# Patient Record
Sex: Female | Born: 1945 | Hispanic: No | Marital: Married | Smoking: Never smoker
Health system: Southern US, Community
[De-identification: ages and names within clinical notes are randomized; demographics above are authoritative.]

## PROBLEM LIST (undated history)

## (undated) DIAGNOSIS — E039 Hypothyroidism, unspecified: Secondary | ICD-10-CM

## (undated) DIAGNOSIS — E079 Disorder of thyroid, unspecified: Secondary | ICD-10-CM

## (undated) DIAGNOSIS — N3010 Interstitial cystitis (chronic) without hematuria: Secondary | ICD-10-CM

## (undated) DIAGNOSIS — G8929 Other chronic pain: Secondary | ICD-10-CM

## (undated) DIAGNOSIS — Z950 Presence of cardiac pacemaker: Secondary | ICD-10-CM

## (undated) DIAGNOSIS — I509 Heart failure, unspecified: Secondary | ICD-10-CM

## (undated) DIAGNOSIS — E119 Type 2 diabetes mellitus without complications: Secondary | ICD-10-CM

## (undated) DIAGNOSIS — I499 Cardiac arrhythmia, unspecified: Secondary | ICD-10-CM

## (undated) HISTORY — PX: NECK SURGERY: SHX720

## (undated) HISTORY — PX: ABDOMINAL HYSTERECTOMY: SHX81

## (undated) HISTORY — PX: SHOULDER SURGERY: SHX246

## (undated) HISTORY — PX: CHOLECYSTECTOMY: SHX55

## (undated) HISTORY — PX: THYROIDECTOMY: SHX17

---

## 2007-10-19 ENCOUNTER — Ambulatory Visit (HOSPITAL_BASED_OUTPATIENT_CLINIC_OR_DEPARTMENT_OTHER): Admission: RE | Admit: 2007-10-19 | Discharge: 2007-10-19 | Payer: Self-pay | Admitting: Urology

## 2010-06-08 NOTE — Op Note (Signed)
NAMECHANTI, GOLUBSKI              ACCOUNT NO.:  [id number]   MEDICAL RECORD NO.:  [id number]          PATIENT TYPE:  AMB   LOCATION:  [facility]                         FACILITY:  [facility]   PHYSICIAN:  Jamison Neighbor, M.D.  DATE OF BIRTH:  08-23-1945   DATE OF PROCEDURE:  10/19/2007  DATE OF DISCHARGE:                               OPERATIVE REPORT   PREOPERATIVE DIAGNOSES:  1. Interstitial cystitis.  2. Urgency incontinence.   POSTOPERATIVE DIAGNOSES:  1. Interstitial cystitis.  2. Urgency incontinence.   PROCEDURE:  Cystoscopy, urethral calibration, hydrodistention of the  bladder, Marcaine and Pyridium installation, Marcaine and Kenalog  injection, Botox injections x2.   SURGEON:  Dr. Logan Bores.   ANESTHESIA:  General.   COMPLICATIONS:  None.   DRAINS:  None.   BRIEF HISTORY:  This 65 year old female has been under care of a  urologist in [facility] for what has been felt to be a severe case of  interstitial cystitis.  The patient has significant pain requiring  around-the-clock narcotics and, despite their best efforts, have not  been able to get her under control.  The patient specifically came to  see me with a request for a cystectomy.  We have told her that she needs  to undergo evaluation to determine if that is appropriate for her.  We  did tell her if her pain was very poorly controlled, she had significant  ulceration on hydrodistention, and an anesthetic bladder capacity under  300 mL, we will contemplate cystectomy, but if those findings are not  present, cystectomy would clearly not be indicated.  The patient has  very poorly controlled frequency and urgency and has requested a Botox  injection be performed.  She understands that there does exist a slight  risk that she might have retention issues requiring catheterization.  She gave full informed consent.   PROCEDURE:  After successful induction of general anesthesia, the  patient was placed in the dorsal  position, prepped with Betadine and  draped in the usual sterile fashion.  Careful bimanual examination  revealed an unremarkable vagina.  There was no cystocele, rectocele,  enterocele.  No mass on bimanual exam.  There were no signs of urethral  diverticulum.  Urethra was calibrated with a 32-French with female  urethral sounds with no signs of stenosis or stricture.  The cystoscope  was inserted.  The bladder was carefully inspected.  No tumors or stones  could be seen.  Both ureteral orifices were normal in configuration and  location.  Hydrodistention of the bladder was performed.  The bladder  was distended at a pressure of 100 cm of water for 5 minutes.  When the  bladder was drained, minimal granulations could be seen.  The bladder  capacity was reasonable at 900 mL.  Normal bladder capacity is about  1100, and the average IC capacity is 575, indicating that while the pain  may be significant, this is not a end-stage case of interstitial  cystitis.  The patient did not require biopsy or fulguration.  Botox  injection was performed.  The bladder was injected with 20  different  injections for a total of 20 mL.  The injection was throughout the  bladder.  The bladder was drained.  A mixture of Marcaine and Pyridium  was left in the bladder.  A mixture of Marcaine and Kenalog was injected  periurethrally.  The patient tolerated the procedure well and was taken  to recovery room in good condition.  She has received intraoperative  Toradol, Zofran and a B&O suppository.  She will be sent home with  Percocet and doxycycline.  She already has Pyridium.  When she returns,  will assess the results of the hydrodistention and Botox.  If she has  ongoing issues with retention. she will either do self-catheterization  or perhaps a temporary suprapubic tube.  If she has ongoing problems  with pain not responsive to the hydrodistention, I will consider  instillation therapy as well as a broadening  of her medical therapy,  possibly including antidepressants and/or Lyrica and possibly include  medication directed to her pelvic floor.  Will also discuss with her the  possibility of controlling her frequency and urgency with InterStim if  the Botox does not work.  Also, physical therapy will be considered.  At  this point, however, we do not feel that the cystectomy will be  indicated do this relatively normal bladder capacity.  If there are  other forms of pelvic pain not necessary referable to the bladder, we  will consider referring her to Dr. Saralyn Pilar at [facility] for evaluation.      Jamison Neighbor, M.D.  Electronically Signed     RJE/MEDQ  D:  10/19/2007  T:  10/20/2007  Job:  [id number]

## 2010-10-25 LAB — POCT I-STAT 4, (NA,K, GLUC, HGB,HCT)
Glucose, Bld: 170 — ABNORMAL HIGH
Potassium: 4.2

## 2010-10-25 LAB — GLUCOSE, CAPILLARY: Glucose-Capillary: 170 — ABNORMAL HIGH

## 2012-09-15 ENCOUNTER — Emergency Department (HOSPITAL_BASED_OUTPATIENT_CLINIC_OR_DEPARTMENT_OTHER)
Admission: EM | Admit: 2012-09-15 | Discharge: 2012-09-15 | Disposition: A | Discharge status: Home / Self Care | Payer: Managed Care, Other (non HMO) | Attending: Emergency Medicine | Admitting: Emergency Medicine

## 2012-09-15 ENCOUNTER — Emergency Department (HOSPITAL_BASED_OUTPATIENT_CLINIC_OR_DEPARTMENT_OTHER): Payer: Managed Care, Other (non HMO)

## 2012-09-15 ENCOUNTER — Encounter (HOSPITAL_BASED_OUTPATIENT_CLINIC_OR_DEPARTMENT_OTHER): Payer: Self-pay | Admitting: *Deleted

## 2012-09-15 DIAGNOSIS — R109 Unspecified abdominal pain: Secondary | ICD-10-CM | POA: Insufficient documentation

## 2012-09-15 DIAGNOSIS — Z87448 Personal history of other diseases of urinary system: Secondary | ICD-10-CM | POA: Insufficient documentation

## 2012-09-15 DIAGNOSIS — R112 Nausea with vomiting, unspecified: Secondary | ICD-10-CM | POA: Insufficient documentation

## 2012-09-15 DIAGNOSIS — E079 Disorder of thyroid, unspecified: Secondary | ICD-10-CM | POA: Insufficient documentation

## 2012-09-15 DIAGNOSIS — Z90710 Acquired absence of both cervix and uterus: Secondary | ICD-10-CM | POA: Insufficient documentation

## 2012-09-15 DIAGNOSIS — Z79899 Other long term (current) drug therapy: Secondary | ICD-10-CM | POA: Insufficient documentation

## 2012-09-15 DIAGNOSIS — R197 Diarrhea, unspecified: Secondary | ICD-10-CM | POA: Insufficient documentation

## 2012-09-15 DIAGNOSIS — Z9089 Acquired absence of other organs: Secondary | ICD-10-CM | POA: Insufficient documentation

## 2012-09-15 DIAGNOSIS — E119 Type 2 diabetes mellitus without complications: Secondary | ICD-10-CM | POA: Insufficient documentation

## 2012-09-15 HISTORY — DX: Type 2 diabetes mellitus without complications: E11.9

## 2012-09-15 HISTORY — DX: Disorder of thyroid, unspecified: E07.9

## 2012-09-15 HISTORY — DX: Interstitial cystitis (chronic) without hematuria: N30.10

## 2012-09-15 LAB — COMPREHENSIVE METABOLIC PANEL
BUN: 10 mg/dL (ref 6–23)
CO2: 29 mEq/L (ref 19–32)
Calcium: 9.4 mg/dL (ref 8.4–10.5)
Chloride: 101 mEq/L (ref 96–112)
Creatinine, Ser: 0.9 mg/dL (ref 0.50–1.10)
GFR calc non Af Amer: 66 mL/min — ABNORMAL LOW (ref >90)
Total Bilirubin: 0.5 mg/dL (ref 0.3–1.2)

## 2012-09-15 LAB — CBC WITH DIFFERENTIAL/PLATELET
Basophils Relative: 0 % (ref 0–1)
Eosinophils Relative: 1 % (ref 0–5)
HCT: 39.7 % (ref 36.0–46.0)
Hemoglobin: 13.2 g/dL (ref 12.0–15.0)
Lymphocytes Relative: 23 % (ref 12–46)
MCHC: 33.2 g/dL (ref 30.0–36.0)
MCV: 84.5 fL (ref 78.0–100.0)
Monocytes Absolute: 0.7 10*3/uL (ref 0.1–1.0)
Monocytes Relative: 7 % (ref 3–12)
Neutro Abs: 6.8 10*3/uL (ref 1.7–7.7)

## 2012-09-15 LAB — URINALYSIS, ROUTINE W REFLEX MICROSCOPIC
Ketones, ur: NEGATIVE mg/dL
Leukocytes, UA: NEGATIVE
Nitrite: NEGATIVE
Protein, ur: NEGATIVE mg/dL
Urobilinogen, UA: 0.2 mg/dL (ref 0.0–1.0)

## 2012-09-15 LAB — LIPASE, BLOOD: Lipase: 34 U/L (ref 11–59)

## 2012-09-15 MED ORDER — OXYCODONE-ACETAMINOPHEN 5-325 MG PO TABS: 2.0000 | ORAL_TABLET | ORAL | Status: DC | PRN

## 2012-09-15 MED ORDER — PROMETHAZINE HCL 25 MG/ML IJ SOLN
25.0000 mg | Freq: Once | INTRAMUSCULAR | Status: AC
  Administered 2012-09-15: 25 mg via INTRAVENOUS
  Filled 2012-09-15: qty 1

## 2012-09-15 MED ORDER — ONDANSETRON 4 MG PO TBDP: 4.0000 mg | ORAL_TABLET | Freq: Three times a day (TID) | ORAL | Status: DC | PRN

## 2012-09-15 MED ORDER — IOHEXOL 300 MG/ML  SOLN
100.0000 mL | Freq: Once | INTRAMUSCULAR | Status: AC | PRN
  Administered 2012-09-15: 100 mL via INTRAVENOUS

## 2012-09-15 MED ORDER — DICYCLOMINE HCL 20 MG PO TABS: 20.0000 mg | ORAL_TABLET | Freq: Two times a day (BID) | ORAL | Status: DC

## 2012-09-15 MED ORDER — ONDANSETRON HCL 4 MG/2ML IJ SOLN
4.0000 mg | Freq: Once | INTRAMUSCULAR | Status: AC
  Administered 2012-09-15: 4 mg via INTRAVENOUS
  Filled 2012-09-15: qty 2

## 2012-09-15 MED ORDER — HYDROMORPHONE HCL PF 1 MG/ML IJ SOLN
1.0000 mg | Freq: Once | INTRAMUSCULAR | Status: AC
  Administered 2012-09-15: 1 mg via INTRAVENOUS
  Filled 2012-09-15: qty 1

## 2012-09-15 MED ORDER — SODIUM CHLORIDE 0.9 % IV BOLUS (SEPSIS)
1000.0000 mL | Freq: Once | INTRAVENOUS | Status: AC
  Administered 2012-09-15: 1000 mL via INTRAVENOUS

## 2012-09-15 MED ORDER — MORPHINE SULFATE 4 MG/ML IJ SOLN
4.0000 mg | Freq: Once | INTRAMUSCULAR | Status: AC
  Administered 2012-09-15: 4 mg via INTRAVENOUS
  Filled 2012-09-15: qty 1

## 2012-09-15 MED ORDER — IOHEXOL 300 MG/ML  SOLN
50.0000 mL | Freq: Once | INTRAMUSCULAR | Status: AC | PRN
  Administered 2012-09-15: 50 mL via ORAL

## 2012-09-15 NOTE — ED Provider Notes (Signed)
Medical screening examination/treatment/procedure(s) were performed by non-physician practitioner and as supervising physician I was immediately available for consultation/collaboration.   Lyanne Co, MD 09/15/12 2103

## 2012-09-15 NOTE — ED Notes (Signed)
Pt ambulating independently w/ steady gait on d/c in no acute distress, A&Ox4. D/c instructions reviewed w/ pt and family - pt and family deny any further questions or concerns at present. Rx given x3, Pt instructed to not use alcohol, drive, or operate heavy machinery while take the prescription pain medications as they could make him drowsy - pt verbalized understanding.

## 2012-09-15 NOTE — ED Notes (Signed)
N/V/D since last week.

## 2012-09-15 NOTE — ED Provider Notes (Signed)
CSN: [id number]     Arrival date & time 09/15/12  1215 History     First MD Initiated Contact with Patient 09/15/12 1415     Chief Complaint  Patient presents with  . Abdominal Pain   (Consider location/radiation/quality/duration/timing/severity/associated sxs/prior Treatment) HPI Comments: [name] a 67 year old female with a past medical history of thyroid cancer, diabetes, and interstitial cystitis who presents with a 1 week history of abdominal pain. The pain is located in her epigastrium and LLQ and does not radiate. The pain is described as cramping and severe. The pain started gradually and progressively worsened since the onset. No alleviating/aggravating factors. The patient has tried nothing for symptoms without relief. Associated symptoms include NVD. Patient denies fever, headache, chest pain, SOB, dysuria, constipation, abnormal vaginal bleeding/discharge. Patient reports previous cholecystectomy and hysterectomy.     [name] a 67 y.o. female presenting with abdominal pain.  Abdominal Pain Associated symptoms: diarrhea, nausea and vomiting     Past Medical History  Diagnosis Date  . Thyroid disease   . Diabetes mellitus without complication   . Interstitial cystitis    Past Surgical History  Procedure Laterality Date  . Abdominal hysterectomy    . Cholecystectomy    . Thyroidectomy     No family history on file. History  Substance Use Topics  . Smoking status: Never Smoker   . Smokeless tobacco: Not on file  . Alcohol Use: No   OB History   Grav Para Term Preterm Abortions TAB SAB Ect Mult Living                 Review of Systems  Gastrointestinal: Positive for nausea, vomiting, abdominal pain and diarrhea.  All other systems reviewed and are negative.    Allergies  Elmiron  Home Medications   Current Outpatient Rx  Name  Route  Sig  Dispense  Refill  . ALPRAZolam (XANAX) 1 MG tablet   Oral   Take 1 mg by mouth at bedtime as needed for  sleep.         . cyclobenzaprine (FLEXERIL) 10 MG tablet   Oral   Take 10 mg by mouth 3 (three) times daily as needed for muscle spasms.         Marland Kitchen desloratadine (CLARINEX) 5 MG tablet   Oral   Take 5 mg by mouth daily.         . fluconazole (DIFLUCAN) 150 MG tablet   Oral   Take 150 mg by mouth once.         Marland Kitchen glipiZIDE (GLUCOTROL XL) 2.5 MG 24 hr tablet   Oral   Take 2.5 mg by mouth daily.         Marland Kitchen levothyroxine (SYNTHROID, LEVOTHROID) 125 MCG tablet   Oral   Take 125 mcg by mouth daily before breakfast.         . lidocaine (LIDODERM) 5 %   Transdermal   Place 1 patch onto the skin daily. Remove & Discard patch within 12 hours or as directed by MD         . meclizine (ANTIVERT) 25 MG tablet   Oral   Take 25 mg by mouth 3 (three) times daily as needed.         . ondansetron (ZOFRAN) 4 MG tablet   Oral   Take 4 mg by mouth every 8 (eight) hours as needed for nausea.         Marland Kitchen oxyCODONE (OXYCONTIN) 10 MG  12 hr tablet   Oral   Take 15 mg by mouth every 12 (twelve) hours.         Marland Kitchen oxyCODONE-acetaminophen (PERCOCET) 10-325 MG per tablet   Oral   Take 1 tablet by mouth every 4 (four) hours as needed for pain.         Marland Kitchen terconazole (TERAZOL 3) 0.8 % vaginal cream   Vaginal   Place 1 applicator vaginally at bedtime.          BP 133/85  Pulse 106  Temp(Src) 98.6 F (37 C) (Oral)  Resp 20  Ht 5' (1.524 m)  Wt 223 lb (101.152 kg)  BMI 43.55 kg/m2  SpO2 97% Physical Exam  Nursing note and vitals reviewed. Constitutional: She is oriented to person, place, and time. She appears well-developed and well-nourished. No distress.  HENT:  Head: Normocephalic and atraumatic.  Eyes: Conjunctivae and EOM are normal. Pupils are equal, round, and reactive to light. No scleral icterus.  Neck: Normal range of motion.  Cardiovascular: Normal rate and regular rhythm.  Exam reveals no gallop and no friction rub.   No murmur heard. Pulmonary/Chest: Effort  normal and breath sounds normal. She has no wheezes. She has no rales. She exhibits no tenderness.  Abdominal: Soft. She exhibits no distension. There is tenderness. There is no rebound and no guarding.  Epigastric and LLQ tenderness to palpation. No peritoneal signs or other focal tenderness to palpation.   Musculoskeletal: Normal range of motion.  Neurological: She is alert and oriented to person, place, and time. Coordination normal.  Speech is goal-oriented. Moves limbs without ataxia.   Skin: Skin is warm and dry.  Psychiatric: She has a normal mood and affect. Her behavior is normal.    ED Course   Procedures (including critical care time)  Labs Reviewed  COMPREHENSIVE METABOLIC PANEL - Abnormal; Notable for the following:    Glucose, Bld 147 (*)    ALT 40 (*)    GFR calc non Af Amer 66 (*)    GFR calc Af Amer 76 (*)    All other components within normal limits  URINE CULTURE  CBC WITH DIFFERENTIAL  LIPASE, BLOOD  URINALYSIS, ROUTINE W REFLEX MICROSCOPIC   Ct Abdomen Pelvis W Contrast  09/15/2012   CLINICAL DATA:  Nausea, vomiting and diarrhea. Epigastric and mid abdominal pain. Previous cholecystectomy. Bladder neural stimulator.  EXAM: CT ABDOMEN AND PELVIS WITH CONTRAST  TECHNIQUE: Multidetector CT imaging of the abdomen and pelvis was performed using the standard protocol following bolus administration of intravenous contrast.  CONTRAST:  50mL OMNIPAQUE IOHEXOL 300 MG/ML SOLN, OMNIPAQUE IOHEXOL 300 MG/ML SOLN  COMPARISON:  None.  FINDINGS: 2.9 x 2.8 cm mass like density in the posterior aspect of the cecum, just below the level of the ileocecal valve. The remainder of the contents of the right colon are more fluid in density. The oral contrast has not reached the right colon at this point. No findings suspicious for appendicitis. Multiple sigmoid and descending colon diverticula without evidence of diverticulitis.  Neural stimulator lead crossing the sacrum on the left.  Bilateral pelvic phleboliths. Surgically absent uterus. No adnexal masses or enlarged lymph nodes.  Mild diffuse low density of the liver relative to the spleen. There is also a 1.4 cm rounded, poorly defined low density mass in the right lobe of the liver on image 19. No other liver masses are seen. The intrahepatic and extrahepatic bile ducts are mildly dilated, within normal limits following cholecystectomy.  Unremarkable spleen, pancreas and adrenal glands. Probable tiny cysts in both kidneys. Unremarkable urinary bladder.  Minimal linear atelectasis or scarring at the right lung base. Mild lumbar lower thoracic spine degenerative changes. Approximately 30% superior endplate compression deformity of the T12 vertebral body with Schmorl's node formation and mild bony retropulsion. No acute fracture lines.  IMPRESSION: 1. 2.9 cm mass or focal stool in the cecum. Delayed images through this region are recommended once the oral contrast has had time to pass into the right colon.  2.  Colonic diverticulosis without evidence of diverticulitis.  3. 1.4 cm nonspecific right lobe liver mass. Further evaluation with elective, outpatient pre and post contrast magnetic resonance imaging of the liver is recommended to better characterize this mass.  4.  Probable tiny cysts in both kidneys.  5. Old T12 vertebral compression fracture.   Electronically Signed   By: Gordan Payment   On: 09/15/2012 18:14   1. Abdominal pain   2. Nausea and vomiting   3. Diarrhea     MDM  4:06 PM Labs unremarkable for acute changes. Urinalysis unremarkable. Patient tachycardic with remaining vitals stable. Patient given morphine and zofran. Patient will have dilaudid and phenergan due to persistent pain.   6:42 PM CT scan unremarkable for acute changes that would cause her symptoms. Patient will have another liter of fluids here. Patient likely has a virus. Patient will be treated symptomatically with instructions to follow up with her  doctor. Patient instructed to return to the ED with worsening or concerning symptoms.   8:46 PM Patient had repeat CT abdomen pelvis due to delayed contrast movement. Cecal "mass" re-evaluated and seen to be stool. Patient will have symptomatic treatment and GI follow up. No further evaluation needed at this time.   Emilia Beck, [place] 09/15/12 2053

## 2012-09-15 NOTE — ED Notes (Signed)
Patient transported to CT 

## 2012-09-16 ENCOUNTER — Telehealth (HOSPITAL_COMMUNITY): Payer: Self-pay | Admitting: Emergency Medicine

## 2012-09-16 NOTE — ED Notes (Signed)
Pharmacy called for Rx verification of Percocet.

## 2012-09-18 LAB — URINE CULTURE

## 2019-06-22 ENCOUNTER — Encounter (HOSPITAL_BASED_OUTPATIENT_CLINIC_OR_DEPARTMENT_OTHER): Payer: Self-pay

## 2019-06-22 ENCOUNTER — Emergency Department (HOSPITAL_BASED_OUTPATIENT_CLINIC_OR_DEPARTMENT_OTHER)
Admission: EM | Admit: 2019-06-22 | Discharge: 2019-06-22 | Disposition: A | Discharge status: Home / Self Care | Payer: Medicare Other | Attending: Emergency Medicine | Admitting: Emergency Medicine

## 2019-06-22 ENCOUNTER — Other Ambulatory Visit: Payer: Self-pay

## 2019-06-22 ENCOUNTER — Emergency Department (HOSPITAL_BASED_OUTPATIENT_CLINIC_OR_DEPARTMENT_OTHER): Payer: Medicare Other

## 2019-06-22 DIAGNOSIS — T07XXXA Unspecified multiple injuries, initial encounter: Secondary | ICD-10-CM

## 2019-06-22 DIAGNOSIS — S8002XA Contusion of left knee, initial encounter: Secondary | ICD-10-CM | POA: Diagnosis not present

## 2019-06-22 DIAGNOSIS — E119 Type 2 diabetes mellitus without complications: Secondary | ICD-10-CM | POA: Diagnosis not present

## 2019-06-22 DIAGNOSIS — Y939 Activity, unspecified: Secondary | ICD-10-CM | POA: Diagnosis not present

## 2019-06-22 DIAGNOSIS — S301XXA Contusion of abdominal wall, initial encounter: Secondary | ICD-10-CM | POA: Diagnosis not present

## 2019-06-22 DIAGNOSIS — S8001XA Contusion of right knee, initial encounter: Secondary | ICD-10-CM | POA: Insufficient documentation

## 2019-06-22 DIAGNOSIS — Y999 Unspecified external cause status: Secondary | ICD-10-CM | POA: Insufficient documentation

## 2019-06-22 DIAGNOSIS — Z7984 Long term (current) use of oral hypoglycemic drugs: Secondary | ICD-10-CM | POA: Diagnosis not present

## 2019-06-22 DIAGNOSIS — Z79899 Other long term (current) drug therapy: Secondary | ICD-10-CM | POA: Diagnosis not present

## 2019-06-22 DIAGNOSIS — W1839XA Other fall on same level, initial encounter: Secondary | ICD-10-CM | POA: Insufficient documentation

## 2019-06-22 DIAGNOSIS — S7012XA Contusion of left thigh, initial encounter: Secondary | ICD-10-CM | POA: Diagnosis not present

## 2019-06-22 DIAGNOSIS — Y929 Unspecified place or not applicable: Secondary | ICD-10-CM | POA: Diagnosis not present

## 2019-06-22 DIAGNOSIS — E079 Disorder of thyroid, unspecified: Secondary | ICD-10-CM | POA: Diagnosis not present

## 2019-06-22 DIAGNOSIS — R519 Headache, unspecified: Secondary | ICD-10-CM | POA: Diagnosis not present

## 2019-06-22 DIAGNOSIS — R296 Repeated falls: Secondary | ICD-10-CM

## 2019-06-22 DIAGNOSIS — Z20822 Contact with and (suspected) exposure to covid-19: Secondary | ICD-10-CM | POA: Diagnosis not present

## 2019-06-22 LAB — COMPREHENSIVE METABOLIC PANEL
ALT: 20 U/L (ref 0–44)
AST: 19 U/L (ref 15–41)
Albumin: 3.8 g/dL (ref 3.5–5.0)
Alkaline Phosphatase: 75 U/L (ref 38–126)
Anion gap: 10 (ref 5–15)
BUN: 14 mg/dL (ref 8–23)
CO2: 24 mmol/L (ref 22–32)
Calcium: 8.2 mg/dL — ABNORMAL LOW (ref 8.9–10.3)
Chloride: 101 mmol/L (ref 98–111)
Creatinine, Ser: 1.14 mg/dL — ABNORMAL HIGH (ref 0.44–1.00)
GFR calc Af Amer: 56 mL/min — ABNORMAL LOW (ref >60)
GFR calc non Af Amer: 48 mL/min — ABNORMAL LOW (ref >60)
Glucose, Bld: 205 mg/dL — ABNORMAL HIGH (ref 70–99)
Potassium: 4.2 mmol/L (ref 3.5–5.1)
Sodium: 135 mmol/L (ref 135–145)
Total Bilirubin: 0.5 mg/dL (ref 0.3–1.2)
Total Protein: 7.7 g/dL (ref 6.5–8.1)

## 2019-06-22 LAB — CBC WITH DIFFERENTIAL/PLATELET
Abs Immature Granulocytes: 0.01 10*3/uL (ref 0.00–0.07)
Basophils Absolute: 0 10*3/uL (ref 0.0–0.1)
Basophils Relative: 1 %
Eosinophils Absolute: 0.1 10*3/uL (ref 0.0–0.5)
Eosinophils Relative: 3 %
HCT: 34.7 % — ABNORMAL LOW (ref 36.0–46.0)
Hemoglobin: 11.3 g/dL — ABNORMAL LOW (ref 12.0–15.0)
Immature Granulocytes: 0 %
Lymphocytes Relative: 35 %
Lymphs Abs: 1.5 10*3/uL (ref 0.7–4.0)
MCH: 27 pg (ref 26.0–34.0)
MCHC: 32.6 g/dL (ref 30.0–36.0)
MCV: 82.8 fL (ref 80.0–100.0)
Monocytes Absolute: 0.3 10*3/uL (ref 0.1–1.0)
Monocytes Relative: 7 %
Neutro Abs: 2.3 10*3/uL (ref 1.7–7.7)
Neutrophils Relative %: 54 %
Platelets: 266 10*3/uL (ref 150–400)
RBC: 4.19 MIL/uL (ref 3.87–5.11)
RDW: 16.2 % — ABNORMAL HIGH (ref 11.5–15.5)
WBC: 4.3 10*3/uL (ref 4.0–10.5)
nRBC: 0 % (ref 0.0–0.2)

## 2019-06-22 LAB — TSH: TSH: 4.302 u[IU]/mL (ref 0.350–4.500)

## 2019-06-22 LAB — PHOSPHORUS: Phosphorus: 3.8 mg/dL (ref 2.5–4.6)

## 2019-06-22 LAB — SARS CORONAVIRUS 2 BY RT PCR (HOSPITAL ORDER, PERFORMED IN ~~LOC~~ HOSPITAL LAB): SARS Coronavirus 2: NEGATIVE

## 2019-06-22 LAB — PROTIME-INR
INR: 0.9 (ref 0.8–1.2)
Prothrombin Time: 12.2 seconds (ref 11.4–15.2)

## 2019-06-22 LAB — CK: Total CK: 71 U/L (ref 38–234)

## 2019-06-22 LAB — MAGNESIUM: Magnesium: 1.9 mg/dL (ref 1.7–2.4)

## 2019-06-22 LAB — SEDIMENTATION RATE: Sed Rate: 21 mm/hr (ref 0–22)

## 2019-06-22 NOTE — ED Notes (Signed)
Per pt and her daughter, pt has been falling for months , has hit her head several times had a black eye , per daughter pts speech is very slurred after  And her vsision is always blurry after  States feels like her legs give out and she goes down  , daughter states they have seen her PCP but no tests have been ordered , they are requesting a neuro consult, pt and daughter states pt has cont to drive during these episodes , pt states doesn't get dizzy tho

## 2019-06-22 NOTE — ED Triage Notes (Signed)
Pt arrives with daughter who reports pt has been falling and having trouble with her speech for a few months. Reports patient had another fall yesterday and states that her speech has gotten worse since. Pt reports that when she falls her legs become very weak.

## 2019-06-22 NOTE — Discharge Instructions (Addendum)
1.  Follow-up with [facility] as soon as possible. 2.  Take extreme care with position changes and walking.  Sure before you start to walk that you are steady and do not feel weak or lightheaded.  Sit down immediately if you feel that you are lightheaded.  Use a walker in your home. 3.  Return to the emergency department if you get new or advancing symptoms.

## 2019-06-22 NOTE — ED Provider Notes (Signed)
[facility] EMERGENCY DEPARTMENT Provider Note   CSN: [id number] Arrival date & time: 06/22/19  1255     History Chief Complaint  Patient presents with  . Fall    Nykole Shands is a 74 y.o. female.  HPI Patient has been having frequent falls for about 3 weeks.  She reports that her legs just pretty suddenly feel like jelly and give way.  She denies that she is getting loss of consciousness.  She does not perceive palpitations, chest pain or shortness of breath.  She reports when it happens she cannot stop her fall and just goes to the ground.  She has developed multiple bruises and areas of injury from repetitively falling.  She denies she is experiencing any headache.  She reports she has hit her head a couple of times with these falls.  Denies she is having any visual changes.  She does not have any double vision or loss of vision.  At rest, she is not experiencing focal weakness numbness or tingling of her arms or legs.  She has a very distant history of a cervical spine surgery.  She reports she had a problem with a disc in her neck.  He denies she is experiencing neck pain.  She reports history of scoliosis and back problems but does not feel like that is an active problem right now.  She denies she has loss of continence of her bladder.  She does report that she has interstitial cystitis and some chronic bladder issues but nothing of the change acutely. She has had some decreased appetite.  Appetite fluctuates. [name] seen at [facility].  Patient's daughter reports that they have discussed these falls and her concerns regarding this.  They report however they have not had any imaging studies or tests done at this point.  No referrals have been made to neurology yet.    Past Medical History:  Diagnosis Date  . Diabetes mellitus without complication (HCC)   . Interstitial cystitis   . Thyroid disease     There are no problems to display for this  patient.   Past Surgical History:  Procedure Laterality Date  . ABDOMINAL HYSTERECTOMY    . CHOLECYSTECTOMY    . NECK SURGERY    . SHOULDER SURGERY    . THYROIDECTOMY       OB History   No obstetric history on file.     No family history on file.  Social History   Tobacco Use  . Smoking status: Never Smoker  . Smokeless tobacco: Never Used  Substance Use Topics  . Alcohol use: No  . Drug use: Never    Home Medications Prior to Admission medications   Medication Sig Start Date End Date Taking? Authorizing Provider  ALPRAZolam Prudy Feeler) 1 MG tablet Take 1 mg by mouth at bedtime as needed for sleep.    [provider]  cyclobenzaprine (FLEXERIL) 10 MG tablet Take 10 mg by mouth 3 (three) times daily as needed for muscle spasms.    [provider]  desloratadine (CLARINEX) 5 MG tablet Take 5 mg by mouth daily.    [provider]  dicyclomine (BENTYL) 20 MG tablet Take 1 tablet (20 mg total) by mouth 2 (two) times daily. 09/15/12   Emilia Beck, PA-C  fluconazole (DIFLUCAN) 150 MG tablet Take 150 mg by mouth once.    [provider]  glipiZIDE (GLUCOTROL XL) 2.5 MG 24 hr tablet Take 2.5 mg by mouth daily.  [provider]  levothyroxine (SYNTHROID, LEVOTHROID) 125 MCG tablet Take 125 mcg by mouth daily before breakfast.    [provider]  lidocaine (LIDODERM) 5 % Place 1 patch onto the skin daily. Remove & Discard patch within 12 hours or as directed by MD    [provider]  meclizine (ANTIVERT) 25 MG tablet Take 25 mg by mouth 3 (three) times daily as needed.    [provider]  ondansetron (ZOFRAN ODT) 4 MG disintegrating tablet Take 1 tablet (4 mg total) by mouth every 8 (eight) hours as needed for nausea. 09/15/12   Szekalski, Yvonna Alanis, PA-C  ondansetron (ZOFRAN) 4 MG tablet Take 4 mg by mouth every 8 (eight) hours as needed for nausea.    [provider]  oxyCODONE (OXYCONTIN) 10 MG 12 hr  tablet Take 15 mg by mouth every 12 (twelve) hours.    [provider]  oxyCODONE-acetaminophen (PERCOCET) 10-325 MG per tablet Take 1 tablet by mouth every 4 (four) hours as needed for pain.    [provider]  oxyCODONE-acetaminophen (PERCOCET/ROXICET) 5-325 MG per tablet Take 2 tablets by mouth every 4 (four) hours as needed for pain. 09/15/12   Emilia Beck, PA-C  terconazole (TERAZOL 3) 0.8 % vaginal cream Place 1 applicator vaginally at bedtime.    [provider]    Allergies    Pentosan polysulfate sodium, Elmiron [pentosan polysulfate], and Naloxone  Review of Systems   Review of Systems 10 systems reviewed and negative except as per HPI. Physical Exam Updated Vital Signs BP (!) 170/78 (BP Location: Right Arm)   Pulse 72   Temp 98.4 F (36.9 C) (Oral)   Resp 16   Ht 5' (1.524 m)   Wt 90.7 kg   SpO2 99%   BMI 39.06 kg/m   Physical Exam Constitutional:      Comments: Alert nontoxic.  No respiratory distress.  Mental status clear.  HENT:     Head: Normocephalic and atraumatic.     Nose: Nose normal.     Mouth/Throat:     Mouth: Mucous membranes are moist.     Pharynx: Oropharynx is clear.  Eyes:     Extraocular Movements: Extraocular movements intact.     Conjunctiva/sclera: Conjunctivae normal.     Pupils: Pupils are equal, round, and reactive to light.  Neck:     Comments: Well-healed scar on the posterior cervical spine.  No meningismus.  No soft tissue masses. Cardiovascular:     Rate and Rhythm: Normal rate and regular rhythm.  Pulmonary:     Effort: Pulmonary effort is normal.     Comments: Breath sounds decreased in the left lung base.  Otherwise clear.  Patient has multiple bruises of the left chest wall and hip area.  She denies moderate to mild discomfort of the left thoracic rib cage. Abdominal:     General: There is no distension.     Palpations: Abdomen is soft.     Tenderness: There is no abdominal tenderness. There  is no guarding.     Comments: Multiple bruises of the left lateral abdomen.  Smaller bruise right lateral abdomen.  Musculoskeletal:        General: Normal range of motion.     Comments: Patient has multiple bruises on both knees and large bruises to the left thigh.  These are variable ages from brownish-green to purple.  No warm hematomas present at this time.  Motion intact however no deformities or effusions of the joints.  Patient can elevate each leg independently off the bed and hold.  She can flex and extend at the ankles and bend at the knees.  Skin:    General: Skin is warm and dry.  Neurological:     General: No focal deficit present.     Mental Status: She is oriented to person, place, and time.     Cranial Nerves: No cranial nerve deficit.     Sensory: No sensory deficit.     Coordination: Coordination normal.     Comments: Speech clear.  Cognitive function intact.  Finger-nose exam intact.  Pupils symmetric and responsive to light.  Motor strength upper and lower 4\5 for grip some push and pull.  Dorsiflexion flexion plantar flexion intact against resistance.  No subjective loss of sensation to light touch to lower extremities.  Psychiatric:        Mood and Affect: Mood normal.     ED Results / Procedures / Treatments   Labs (all labs ordered are listed, but only abnormal results are displayed) Labs Reviewed  COMPREHENSIVE METABOLIC PANEL - Abnormal; Notable for the following components:      Result Value   Glucose, Bld 205 (*)    Creatinine, Ser 1.14 (*)    Calcium 8.2 (*)    GFR calc non Af Amer 48 (*)    GFR calc Af Amer 56 (*)    All other components within normal limits  CBC WITH DIFFERENTIAL/PLATELET - Abnormal; Notable for the following components:   Hemoglobin 11.3 (*)    HCT 34.7 (*)    RDW 16.2 (*)    All other components within normal limits  SARS CORONAVIRUS 2 BY RT PCR (HOSPITAL ORDER, [facility])  PROTIME-INR  CK   SEDIMENTATION RATE  MAGNESIUM  PHOSPHORUS  URINALYSIS, ROUTINE W REFLEX MICROSCOPIC  TSH    EKG EKG Interpretation  Date/Time:  Saturday Jun 22 2019 14:21:17 EDT Ventricular Rate:  70 PR Interval:    QRS Duration: 105 QT Interval:  406 QTC Calculation: 439 R Axis:   -16 Text Interpretation: Sinus rhythm no change from old Confirmed by Charlesetta Shanks [phone]) on 06/22/2019 2:56:25 PM   Radiology DG Chest 2 View  Result Date: 06/22/2019 CLINICAL DATA:  Pain status post fall EXAM: CHEST - 2 VIEW COMPARISON:  None. FINDINGS: The heart size and mediastinal contours are within normal limits. Both lungs are clear. The visualized skeletal structures are unremarkable. IMPRESSION: No active cardiopulmonary disease. Electronically Signed   By: Constance Holster M.D.   On: 06/22/2019 15:00   CT Head Wo Contrast  Result Date: 06/22/2019 CLINICAL DATA:  74 year old female with multiple falls and slurred speech. EXAM: CT HEAD WITHOUT CONTRAST CT CERVICAL SPINE WITHOUT CONTRAST TECHNIQUE: Multidetector CT imaging of the head and cervical spine was performed following the standard protocol without intravenous contrast. Multiplanar CT image reconstructions of the cervical spine were also generated. COMPARISON:  09/20/2009 head and cervical spine CT FINDINGS: CT HEAD FINDINGS Brain: No evidence of acute infarction, hemorrhage, hydrocephalus, extra-axial collection or mass lesion/mass effect. Minimal chronic small-vessel white matter ischemic changes again noted. Vascular: No hyperdense vessel or unexpected calcification. Skull: Normal. Negative for fracture or focal lesion. Sinuses/Orbits: No acute finding. Other: None. CT CERVICAL SPINE FINDINGS Alignment: Normal. Skull base and vertebrae: No acute fracture. No primary bone lesion or focal pathologic process. Soft tissues and spinal canal: No prevertebral fluid or swelling. No visible canal hematoma. Disc levels: Mild degenerative disc disease and  spondylosis at C5-6 and C6-7 noted. Upper chest: Negative. Other: None IMPRESSION: 1. No evidence of acute intracranial abnormality. Minimal chronic small-vessel white matter ischemic changes. 2. No static evidence of acute injury to the cervical spine. Electronically Signed   By: Harmon PierJeffrey  Hu M.D.   On: 06/22/2019 15:11   CT Cervical Spine Wo Contrast  Result Date: 06/22/2019 CLINICAL DATA:  74 year old female with multiple falls and slurred speech. EXAM: CT HEAD WITHOUT CONTRAST CT CERVICAL SPINE WITHOUT CONTRAST TECHNIQUE: Multidetector CT imaging of the head and cervical spine was performed following the standard protocol without intravenous contrast. Multiplanar CT image reconstructions of the cervical spine were also generated. COMPARISON:  09/20/2009 head and cervical spine CT FINDINGS: CT HEAD FINDINGS Brain: No evidence of acute infarction, hemorrhage, hydrocephalus, extra-axial collection or mass lesion/mass effect. Minimal chronic small-vessel white matter ischemic changes again noted. Vascular: No hyperdense vessel or unexpected calcification. Skull: Normal. Negative for fracture or focal lesion. Sinuses/Orbits: No acute finding. Other: None. CT CERVICAL SPINE FINDINGS Alignment: Normal. Skull base and vertebrae: No acute fracture. No primary bone lesion or focal pathologic process. Soft tissues and spinal canal: No prevertebral fluid or swelling. No visible canal hematoma. Disc levels: Mild degenerative disc disease and spondylosis at C5-6 and C6-7 noted. Upper chest: Negative. Other: None IMPRESSION: 1. No evidence of acute intracranial abnormality. Minimal chronic small-vessel white matter ischemic changes. 2. No static evidence of acute injury to the cervical spine. Electronically Signed   By: Harmon PierJeffrey  Hu M.D.   On: 06/22/2019 15:11    Procedures Procedures (including critical care time)  Medications Ordered in ED Medications - No data to display  ED Course  I have reviewed the triage  vital signs and the nursing notes.  Pertinent labs & imaging results that were available during my care of the patient were reviewed by me and considered in my medical decision making (see chart for details).    MDM Rules/Calculators/A&P                     Patient presents with frequent falls over the course of the past 3 weeks.  Her mental status is alert and clear.  She does not describe syncopal symptoms.  We will proceed with CT head and lab work.  Will also get chest x-ray as patient has had multiple bruises over the chest wall.  CT does not show any acute infarct patterns.  Cervical spine does not show any stenosis or likely significant impingement.  Patient does not experience neck pain.  At this point she has been having frequent falls which seem to come with sudden weakness of the extremities.  This does not sound syncopal.  On physical exam patient has good strength testing.  Basic evaluation does not suggest autoimmune condition, myositis, electrolyte derangement or anemia.  Condition of the lower extremities is good aside from bruising from falls.  She has excellent pulses and condition is good.  At this time, plan will be for discharge to home with extra care for falls and recommendation to use a walker at home.  Ambulatory referral has been sent to neurology.  Patient should also follow-up ASAP with PCP for additional evaluation and referrals as needed. Final Clinical Impression(s) / ED Diagnoses Final diagnoses:  Frequent falls  Multiple contusions    Rx / DC Orders ED Discharge Orders         Ordered    Ambulatory referral to Neurology  Status:  Canceled    Comments:  An appointment is requested in approximately: 1 week   06/22/19 1552    Ambulatory referral to Neurology    Comments: An appointment is requested in approximately:1 week   06/22/19 1554           Arby Barrette, MD 06/22/19 1558

## 2019-06-26 ENCOUNTER — Other Ambulatory Visit: Payer: Self-pay

## 2019-06-26 ENCOUNTER — Encounter: Payer: Self-pay | Admitting: Neurology

## 2019-06-26 ENCOUNTER — Ambulatory Visit (INDEPENDENT_AMBULATORY_CARE_PROVIDER_SITE_OTHER): Payer: Medicare Other | Admitting: Neurology

## 2019-06-26 VITALS — BP 145/80 | HR 76 | Ht 60.0 in | Wt 209.0 lb

## 2019-06-26 DIAGNOSIS — S0990XS Unspecified injury of head, sequela: Secondary | ICD-10-CM

## 2019-06-26 DIAGNOSIS — R296 Repeated falls: Secondary | ICD-10-CM | POA: Diagnosis not present

## 2019-06-26 DIAGNOSIS — R270 Ataxia, unspecified: Secondary | ICD-10-CM

## 2019-06-26 DIAGNOSIS — H519 Unspecified disorder of binocular movement: Secondary | ICD-10-CM

## 2019-06-26 DIAGNOSIS — S0990XA Unspecified injury of head, initial encounter: Secondary | ICD-10-CM | POA: Diagnosis not present

## 2019-06-26 DIAGNOSIS — M542 Cervicalgia: Secondary | ICD-10-CM

## 2019-06-26 NOTE — Patient Instructions (Signed)

## 2019-06-26 NOTE — Progress Notes (Signed)
Provider:  Melvyn Novas, M D  Referring Provider: Karle Plumber, MD Primary Care Physician:  Karle Plumber, MD  Chief Complaint  Patient presents with   New Patient (Initial Visit)    RM 10 with granddaughter, Lauren. ED referral for frequent falls in the last few weeks. She has no warning, her legs will give out on her. Been going on for the last couple months.     HPI:  Jillian Leblanc is a 74 y.o. female  And seen here on 06-26-2019  as a referral from [facility] for frequent , sudden falls.   Jillian Leblanc describes that she has no warning, no aura, no spell of dizziness or vision change.  She may fall forward backwards or sideways.  She fell forwards on her face onto a concrete surface - her head hit the floor. She scratched her glasses, her chin and had a black eye.  The falls have become more frequent have been going on for couple of months and they can occur at anytime of the day and seem unrelated to mealtimes, sleep hours, or any activity preceding a fall.  She is not excessively fatigued when she experiences these she is not nauseated. No loss of awareness.  She has bruising on both elbows and left hip.  She has fallen more than twice a week. The last time 06-22-2019.   Review of Systems: Out of a complete 14 system review, the patient complains of only the following symptoms, and all other reviewed systems are negative.   Social History   Socioeconomic History   Marital status: Married    Spouse name: Not on file   Number of children: Not on file   Years of education: Not on file   Highest education level: Not on file  Occupational History   Not on file  Tobacco Use   Smoking status: Never Smoker   Smokeless tobacco: Never Used  Substance and Sexual Activity   Alcohol use: No   Drug use: Never   Sexual activity: Not on file  Other Topics Concern   Not on file  Social History Narrative   Not on file   Social Determinants  of Health   Financial Resource Strain:    Difficulty of Paying Living Expenses:   Food Insecurity:    Worried About Running Out of Food in the Last Year:    Barista in the Last Year:   Transportation Needs:    Freight forwarder (Medical):    Lack of Transportation (Non-Medical):   Physical Activity:    Days of Exercise per Week:    Minutes of Exercise per Session:   Stress:    Feeling of Stress :   Social Connections:    Frequency of Communication with Friends and Family:    Frequency of Social Gatherings with Friends and Family:    Attends Religious Services:    Active Member of Clubs or Organizations:    Attends Engineer, structural:    Marital Status:   Intimate Partner Violence:    Fear of Current or Ex-Partner:    Emotionally Abused:    Physically Abused:    Sexually Abused:     No family history on file.  Past Medical History:  Diagnosis Date   Diabetes mellitus without complication (HCC)    Interstitial cystitis    Thyroid disease     Past Surgical History:  Procedure Laterality Date   ABDOMINAL  HYSTERECTOMY     CHOLECYSTECTOMY     NECK SURGERY     SHOULDER SURGERY     THYROIDECTOMY      Current Outpatient Medications  Medication Sig Dispense Refill   amitriptyline (ELAVIL) 50 MG tablet Take 50 mg by mouth at bedtime.     ASPIRIN LOW DOSE 81 MG EC tablet Take 81 mg by mouth daily.     calcium carbonate (TUMS EX) 750 MG chewable tablet Chew by mouth.     citalopram (CELEXA) 20 MG tablet Take 20 mg by mouth at bedtime.     diclofenac Sodium (VOLTAREN) 1 % GEL Apply topically as needed.     fluticasone (FLONASE) 50 MCG/ACT nasal spray as needed.     hydrOXYzine (ATARAX/VISTARIL) 25 MG tablet Take 1-2 tablets by mouth every evening.     ibuprofen (ADVIL) 600 MG tablet Take 600 mg by mouth 3 (three) times daily.     insulin glargine, 1 Unit Dial, (TOUJEO) 300 UNIT/ML Solostar Pen Inject 15 Units into  the skin daily.     levothyroxine (SYNTHROID) 100 MCG tablet Take 100 mcg by mouth daily.     metFORMIN (GLUCOPHAGE) 500 MG tablet Take 1,000 mg by mouth in the morning and at bedtime.     omeprazole (PRILOSEC) 40 MG capsule Take 40 mg by mouth as needed.     ondansetron (ZOFRAN) 4 MG tablet Take 4 mg by mouth every 8 (eight) hours as needed for nausea.     oxybutynin (DITROPAN-XL) 10 MG 24 hr tablet Take 10 mg by mouth daily.     oxyCODONE-acetaminophen (PERCOCET) 10-325 MG per tablet Take 1 tablet by mouth every 4 (four) hours as needed for pain.     promethazine (PHENERGAN) 25 MG tablet Take 25 mg by mouth as needed.     rOPINIRole (REQUIP) 3 MG tablet Take 3 mg by mouth at bedtime.     No current facility-administered medications for this visit.    Allergies as of 06/26/2019 - Review Complete 06/26/2019  Allergen Reaction Noted   Pentosan polysulfate sodium  10/21/2009   Elmiron [pentosan polysulfate]  09/15/2012   Naloxone Other (See Comments) 08/11/2017    Vitals: BP (!) 145/80 (BP Location: Right Arm, Patient Position: Sitting, Cuff Size: Normal)    Pulse 76    Ht 5' (1.524 m)    Wt 209 lb (94.8 kg)    SpO2 97%    BMI 40.82 kg/m  Last Weight:  Wt Readings from Last 1 Encounters:  06/26/19 209 lb (94.8 kg)   Last Height:   Ht Readings from Last 1 Encounters:  06/26/19 5' (1.524 m)    Physical exam:  General: The patient is awake, alert and appears not in acute distress. The patient is well groomed. Head: Normocephalic, atraumatic. Neck is supple. Mallampati , neck circumference:  Cardiovascular:  Regular rate and rhythm , without  murmurs or carotid bruit, and without distended neck veins. Respiratory: Lungs are clear to auscultation. Skin:  Without evidence of edema, or rash Trunk: BMI is 40.8 kg/m2  elevated and patient has normal posture.  Neurologic exam : The patient is awake and alert, oriented to place and time.  Memory subjective  described as  intact. There is a normal attention span & concentration ability. Speech is fluent without  Dysarthria, but stutters sometimes- mild dysphonia , some expressive  Aphasia.? Mood and affect are worried.  Cranial nerves: Pupils are equal and briskly reactive to light.  Funduscopic exam without  evidence of pallor or edema.  Extraocular movements  in vertical and horizontal planes show a suf dden reflex movement back to the midpoint when following a moving object to the right.   Vision acuity is low- Visual fields by finger perimetry are intact. Hearing to finger rub intact.  Facial sensation intact to fine touch. Facial motor strength is symmetric and tongue and uvula move midline.  Tongue protrusion into either cheek is normal. Shoulder shrug is normal. neck ROM is limited an the muscles are very tense.   Motor exam:   Normal tone ,muscle bulk and symmetric strength in all extremities. She has an inability to relax her right upper extremity, states its hurting her. Shoulder ROM is limited on the left. No cogwheeling.  Left grip strength is stronger than right for this right hand dominant individual. .   Sensory:  Fine touch, pinprick and vibration were tested in all extremities.  Proprioception was normal.  Coordination: Rapid alternating movements in the fingers/hands were normal. Finger-to-nose maneuver delayed, tremulous and with dysmetria. There is clear  evidence of ataxia, dysmetria or tremor.  Gait and station: Patient walks without assistive device and is able unassisted to climb up to the exam table. Very unsteady appearing, trembling.  Strength within normal limits. Stance is wider based.  Deep tendon reflexes: in the  upper and lower extremities are symmetrically brisk  and intact.  Babinski maneuver response was deferred.   ED summary ; HPI Patient has been having frequent falls for about 3 weeks.  She reports that her legs just pretty suddenly feel like jelly and give way.  She  denies that she is getting loss of consciousness.  She does not perceive palpitations, chest pain or shortness of breath.  She reports when it happens she cannot stop her fall and just goes to the ground.  She has developed multiple bruises and areas of injury from repetitively falling.  She denies she is experiencing any headache.  She reports she has hit her head a couple of times with these falls.  Denies she is having any visual changes.  She does not have any double vision or loss of vision.  At rest, she is not experiencing focal weakness numbness or tingling of her arms or legs.  She has a very distant history of a cervical spine surgery.  She reports she had a problem with a disc in her neck.  He denies she is experiencing neck pain.  She reports history of scoliosis and back problems but does not feel like that is an active problem right now.  She denies she has loss of continence of her bladder.  She does report that she has interstitial cystitis and some chronic bladder issues but nothing of the change acutely. She has had some decreased appetite.  Appetite fluctuates. [name] seen at [facility].  Patient's daughter reports that they have discussed these falls and her concerns regarding this.  They report however they have not had any imaging studies or tests done at this point.  No referrals have been made to neurology yet.   CT of head and cervical spine was normal for age. Skull base and vertebrae: No acute fracture. No primary bone lesion or focal pathologic process.  Soft tissues and spinal canal: No prevertebral fluid or swelling. No visible canal hematoma.  Disc levels: Mild degenerative disc disease and spondylosis at C5-6 and C6-7 noted.  Upper chest: Negative.  Other: None  IMPRESSION: 1. No evidence of  acute intracranial abnormality. Minimal chronic small-vessel white matter ischemic changes. 2. No static evidence of acute injury to the cervical spine.      Result History           Contains abnormal data Comprehensive metabolic panel  Status:  Final result Visible to patient:  No (inaccessible in MyChart) Next appt:  None Order: [id number]  Ref Range & Units 4 d ago 6 yr ago  Sodium 135 - 145 mmol/L 135  140 R   Potassium 3.5 - 5.1 mmol/L 4.2  4.3 R   Chloride 98 - 111 mmol/L 101  101 R   CO2 22 - 32 mmol/L 24  29 R   Glucose, Bld 70 - 99 mg/dL 205High   147High    Comment: Glucose reference range applies only to samples taken after fasting for at least 8 hours.  BUN 8 - 23 mg/dL 14  10 R   Creatinine, Ser 0.44 - 1.00 mg/dL 1.14High   0.90 R   Calcium 8.9 - 10.3 mg/dL 8.2Low   9.4 R   Total Protein 6.5 - 8.1 g/dL 7.7  7.7 R   Albumin 3.5 - 5.0 g/dL 3.8  3.8 R   AST 15 - 41 U/L 19  28 R   ALT 0 - 44 U/L 20  40High  R   Alkaline Phosphatase 38 - 126 U/L 75  61 R   Total Bilirubin 0.3 - 1.2 mg/dL 0.5  0.5   GFR calc non Af Amer >60 mL/min 48Low   66Low  R   GFR calc Af Amer >60 mL/min 56Low   76Low  R, CM   Anion gap 5 - 15 10         Prothrombin Time 11.4 - 15.2 seconds 12.2   INR 0.8 - 1.2 0.9   Comment: (NOTE)  INR goal varies based on device and disease states.  Performed at [facility], [address] [place], [place] [address]   Resulting Agency  [facility]      Specimen Collected: 06/22/19 14:04 Last Resulted: 06/22/19 14:35      Lab Flowsheet    Order Details    View Encounter    Lab and Collection Details    Routing    Result History           CK  Status:  Final result Visible to patient:  No (inaccessible in MyChart) Next appt:  None Order: [id number]  Ref Range & Units 4 d ago  Total CK 38 - 234 U/L 71   Comment: Performed at [facility], [address] [place], [place] [address]  Resulting Agency  [facility]      Specimen Collected: 06/22/19 14:04 Last Resulted: 06/22/19 14:53      Lab Flowsheet    Order Details    View Encounter    Lab and Collection  Details    Routing    Result History           Sedimentation rate  Status:  Final result Visible to patient:  No (inaccessible in MyChart) Next appt:  None Order: [id number]  Ref Range & Units 4 d ago  Sed Rate 0 - 22 mm/hr 21   Comment: Performed at [facility], [address] [place], [place] [address]         Assessment:  After physical and neurologic examination, review of laboratory studies, imaging, neurophysiology testing and  pre-existing records, assessment is that of :   1)Sudden falls with buckling knees- no loss of awareness.  2) very tense neck muscles. Shoulder and abnormal coordination.  3) changes in handwriting. Coordination and finger to nose.  4) abnormal eye movements and vertigo.   Plan:  Treatment plan and additional workup :  MRI brain, cervical spine and , if normal, possibly thoracic spine.   Cardiac monitor.   MOCA with next visit.   EMG and NCV for both legs.       Porfirio Mylararmen Mykaylah Ballman MD 06/26/2019

## 2019-07-02 ENCOUNTER — Telehealth: Payer: Self-pay | Admitting: Neurology

## 2019-07-02 NOTE — Telephone Encounter (Signed)
Faxed information to [facility]. IF MRI safe for them to reach out to the patient to schedule.

## 2019-07-03 NOTE — Telephone Encounter (Signed)
Per [facility] Carrie left msg, per Alycia Rossetti [facility], just need a fully charged remote

## 2019-07-03 NOTE — Telephone Encounter (Signed)
Schedule for 07/23/19.

## 2019-07-23 ENCOUNTER — Ambulatory Visit (HOSPITAL_COMMUNITY): Payer: Medicare Other

## 2019-07-23 ENCOUNTER — Encounter (HOSPITAL_COMMUNITY): Payer: Self-pay

## 2019-07-23 ENCOUNTER — Other Ambulatory Visit: Payer: Self-pay

## 2019-07-23 ENCOUNTER — Ambulatory Visit (HOSPITAL_COMMUNITY)
Admission: RE | Admit: 2019-07-23 | Discharge: 2019-07-23 | Disposition: A | Discharge status: Home / Self Care | Payer: Medicare Other | Source: Ambulatory Visit | Attending: Neurology | Admitting: Neurology

## 2019-07-23 DIAGNOSIS — S0990XS Unspecified injury of head, sequela: Secondary | ICD-10-CM

## 2019-07-23 DIAGNOSIS — M542 Cervicalgia: Secondary | ICD-10-CM

## 2019-07-23 DIAGNOSIS — R296 Repeated falls: Secondary | ICD-10-CM

## 2019-07-25 ENCOUNTER — Ambulatory Visit (INDEPENDENT_AMBULATORY_CARE_PROVIDER_SITE_OTHER): Payer: Medicare Other | Admitting: Diagnostic Neuroimaging

## 2019-07-25 ENCOUNTER — Encounter (INDEPENDENT_AMBULATORY_CARE_PROVIDER_SITE_OTHER): Payer: Medicare Other | Admitting: Diagnostic Neuroimaging

## 2019-07-25 DIAGNOSIS — S0990XA Unspecified injury of head, initial encounter: Secondary | ICD-10-CM

## 2019-07-25 DIAGNOSIS — R296 Repeated falls: Secondary | ICD-10-CM

## 2019-07-25 DIAGNOSIS — S0990XS Unspecified injury of head, sequela: Secondary | ICD-10-CM

## 2019-07-25 DIAGNOSIS — M542 Cervicalgia: Secondary | ICD-10-CM

## 2019-07-25 DIAGNOSIS — Z0289 Encounter for other administrative examinations: Secondary | ICD-10-CM

## 2019-07-25 NOTE — Procedures (Signed)
[facility]  NCS (NERVE CONDUCTION STUDY) WITH EMG (ELECTROMYOGRAPHY) REPORT   STUDY DATE: 07/25/19 PATIENT NAME: Jillian Leblanc DOB: 18-Oct-1945 MRN: [id number]  ORDERING CLINICIAN: Melvyn Novas, MD   TECHNOLOGIST: Durenda Age ELECTROMYOGRAPHER: Glenford Bayley. Link Burgeson, MD  CLINICAL INFORMATION: 74 year old female with frequent falls and low back pain.  FINDINGS: NERVE CONDUCTION STUDY:  Bilateral peroneal and right tibial motor responses are normal.  Left tibial motor response has normal distal latency, decreased amplitude and slow conduction velocity.  Bilateral sural sensory responses are normal.  Bilateral superficial peroneal sensory responses have decreased amplitudes.  Bilateral tibial F wave latencies are normal.   NEEDLE ELECTROMYOGRAPHY:  Needle examination of right lower extremity vastus medialis, tibialis anterior, gastrocnemius and right lumbar paraspinal muscles notable for positive sharp waves and fibrillation potentials in the right tibialis anterior.  Normal motor unit recruitment on exertion.   IMPRESSION:   Abnormal study demonstrating: - Mild axonal sensorimotor polyneuropathy.    INTERPRETING PHYSICIAN:  Suanne Marker, MD Certified in Neurology, Neurophysiology and Neuroimaging  [facility] [address] [place], [place] [address] [phone]  [facility]    Nerve / Sites Muscle Latency Ref. Amplitude Ref. Rel Amp Segments Distance Velocity Ref. Area    ms ms mV mV %  cm m/s m/s mVms  R Peroneal - EDB     Ankle EDB 4.2 ?6.5 3.7 ?2.0 100 Ankle - EDB 9   15.7     Fib head EDB 10.6  3.5  95.2 Fib head - Ankle 28 44 ?44 15.2     Pop fossa EDB 12.9  1.9  53.3 Pop fossa - Fib head 10 44 ?44 10.7         Pop fossa - Ankle      L Peroneal - EDB     Ankle EDB 4.0 ?6.5 3.7 ?2.0 100 Ankle - EDB 9   16.3     Fib head EDB 10.3  3.4  93.5 Fib head - Ankle 28 45 ?44 15.7     Pop fossa EDB 12.6  1.3  38.3 Pop  fossa - Fib head 10 44 ?44 7.9         Pop fossa - Ankle      R Tibial - AH     Ankle AH 3.0 ?5.8 4.9 ?4.0 100 Ankle - AH 9   10.2     Pop fossa AH 10.6  1.4  29.4 Pop fossa - Ankle 31 41 ?41 3.8  L Tibial - AH     Ankle AH 3.1 ?5.8 3.2 ?4.0 100 Ankle - AH 9   7.9     Pop fossa AH 12.6  1.1  35 Pop fossa - Ankle 32 34 ?41 1.8             SNC    Nerve / Sites Rec. Site Peak Lat Ref.  Amp Ref. Segments Distance    ms ms V V  cm  R Sural - Ankle (Calf)     Calf Ankle 3.9 ?4.4 6 ?6 Calf - Ankle 14  L Sural - Ankle (Calf)     Calf Ankle 3.8 ?4.4 8 ?6 Calf - Ankle 14  R Superficial peroneal - Ankle     Lat leg Ankle 3.8 ?4.4 2 ?6 Lat leg - Ankle 14  L Superficial peroneal - Ankle     Lat leg Ankle 3.8 ?4.4 4 ?6 Lat leg - Ankle 14  F  Wave    Nerve F Lat Ref.   ms ms  R Tibial - AH 48.6 ?56.0  L Tibial - AH 55.0 ?56.0         EMG Summary Table    Spontaneous MUAP Recruitment  Muscle IA Fib PSW Fasc Other Amp Dur. Poly Pattern  R. Vastus medialis Normal None None None _______ Normal Normal Normal Normal  R. Tibialis anterior Normal 1+ 1+ None _______ Normal Normal Normal Normal  R. Gastrocnemius (Medial head) Normal None None None _______ Normal Normal Normal Normal  R. Lumbar paraspinals Normal None None None _______ Normal Normal Normal Normal

## 2019-07-26 NOTE — Progress Notes (Signed)
NEEDLE ELECTROMYOGRAPHY:   Needle examination of right lower extremity vastus medialis,  tibialis anterior, gastrocnemius and right lumbar paraspinal  muscles notable for positive sharp waves and fibrillation  potentials in the right tibialis anterior. Normal motor unit  recruitment on exertion.    IMPRESSION:   Abnormal study demonstrating:  - Mild axonal sensorimotor polyneuropathy.   Review for underlying conditions such as DM, hepatic, thyroid disease , vitamin deficiency or anemia.

## 2019-08-01 ENCOUNTER — Telehealth: Payer: Self-pay

## 2019-08-01 ENCOUNTER — Telehealth: Payer: Self-pay | Admitting: Neurology

## 2019-08-01 NOTE — Telephone Encounter (Signed)
Pt returning a call to [facility] ans requesting a call back.

## 2019-08-01 NOTE — Telephone Encounter (Signed)
-----   Message from Melvyn Novas, MD sent at 07/26/2019  7:00 PM EDT ----- NEEDLE ELECTROMYOGRAPHY:   Needle examination of right lower extremity vastus medialis,  tibialis anterior, gastrocnemius and right lumbar paraspinal  muscles notable for positive sharp waves and fibrillation  potentials in the right tibialis anterior. Normal motor unit  recruitment on exertion.    IMPRESSION:   Abnormal study demonstrating:  - Mild axonal sensorimotor polyneuropathy.   Review for underlying conditions such as DM, hepatic, thyroid disease , vitamin deficiency or anemia.

## 2019-08-01 NOTE — Telephone Encounter (Signed)
Called the patient back as she had returned call earlier and I was not available. There was no answer LVM for the patient to call back.

## 2019-08-01 NOTE — Telephone Encounter (Signed)
Called to review the nerve conduction results with the patient. There was no answer. LVM advising the patient to call back.

## 2019-08-02 NOTE — Telephone Encounter (Signed)
Pt returned call and asked to be called on [phone]

## 2019-08-02 NOTE — Telephone Encounter (Signed)
Called the patient back and reviewed the results with her. Recommended that pt continue to keeping diabetes under control as that will help with the neuropathy. [name] scheduled with MRI and I advised that we will call once we have those results as well.

## 2019-08-13 ENCOUNTER — Emergency Department: Payer: Medicare Other

## 2019-08-13 ENCOUNTER — Emergency Department (HOSPITAL_BASED_OUTPATIENT_CLINIC_OR_DEPARTMENT_OTHER): Payer: Medicare Other

## 2019-08-13 ENCOUNTER — Encounter (HOSPITAL_BASED_OUTPATIENT_CLINIC_OR_DEPARTMENT_OTHER): Payer: Self-pay | Admitting: Emergency Medicine

## 2019-08-13 ENCOUNTER — Emergency Department (HOSPITAL_BASED_OUTPATIENT_CLINIC_OR_DEPARTMENT_OTHER)
Admission: EM | Admit: 2019-08-13 | Discharge: 2019-08-13 | Disposition: A | Discharge status: Home / Self Care | Payer: Medicare Other | Source: Home / Self Care | Attending: Emergency Medicine | Admitting: Emergency Medicine

## 2019-08-13 ENCOUNTER — Other Ambulatory Visit: Payer: Self-pay

## 2019-08-13 DIAGNOSIS — Y999 Unspecified external cause status: Secondary | ICD-10-CM | POA: Insufficient documentation

## 2019-08-13 DIAGNOSIS — W19XXXA Unspecified fall, initial encounter: Secondary | ICD-10-CM

## 2019-08-13 DIAGNOSIS — S0181XA Laceration without foreign body of other part of head, initial encounter: Secondary | ICD-10-CM | POA: Insufficient documentation

## 2019-08-13 DIAGNOSIS — Y9389 Activity, other specified: Secondary | ICD-10-CM | POA: Insufficient documentation

## 2019-08-13 DIAGNOSIS — E119 Type 2 diabetes mellitus without complications: Secondary | ICD-10-CM | POA: Insufficient documentation

## 2019-08-13 DIAGNOSIS — I455 Other specified heart block: Secondary | ICD-10-CM | POA: Diagnosis not present

## 2019-08-13 DIAGNOSIS — Y9289 Other specified places as the place of occurrence of the external cause: Secondary | ICD-10-CM | POA: Insufficient documentation

## 2019-08-13 MED ORDER — LIDOCAINE HCL (PF) 1 % IJ SOLN
5.0000 mL | Freq: Once | INTRAMUSCULAR | Status: AC
  Administered 2019-08-13: 5 mL
  Filled 2019-08-13: qty 5

## 2019-08-13 MED ORDER — ACETAMINOPHEN 500 MG PO TABS
1000.0000 mg | ORAL_TABLET | Freq: Once | ORAL | Status: AC
  Administered 2019-08-13: 1000 mg via ORAL
  Filled 2019-08-13: qty 2

## 2019-08-13 NOTE — ED Provider Notes (Signed)
[facility]-EMERGENCY [facility]PT [facility] Emergency Department Provider Note MRN:  [id number]  Arrival date & time: 08/13/19     Chief Complaint   Fall   History of Present Illness   Jillian Leblanc is a 74 y.o. year-old female with a history of diabetes presenting to the ED with chief complaint of fall.  Frequent falls for the past 1 to 2 months, fell again this morning.  Denies any chest pain or dizziness preceding the fall, explains that her "legs just give out".  Head trauma, does not think she lost consciousness, endorsing headache, neck pain, left knee pain.  Pain is mild to moderate in severity, constant, worse with motion and palpation.  Denies chest pain or shortness of breath, no abdominal pain, no numbness or weakness to the arms or legs.  Review of Systems  A complete 10 system review of systems was obtained and all systems are negative except as noted in the HPI and PMH.   Patient's Health History    Past Medical History:  Diagnosis Date  . Diabetes mellitus without complication (HCC)   . Interstitial cystitis   . Thyroid disease     Past Surgical History:  Procedure Laterality Date  . ABDOMINAL HYSTERECTOMY    . CHOLECYSTECTOMY    . NECK SURGERY    . SHOULDER SURGERY    . THYROIDECTOMY      History reviewed. No pertinent family history.  Social History   Socioeconomic History  . Marital status: Married    Spouse name: Not on file  . Number of children: Not on file  . Years of education: Not on file  . Highest education level: Not on file  Occupational History  . Not on file  Tobacco Use  . Smoking status: Never Smoker  . Smokeless tobacco: Never Used  Substance and Sexual Activity  . Alcohol use: No  . Drug use: Never  . Sexual activity: Not on file  Other Topics Concern  . Not on file  Social History Narrative  . Not on file   Social Determinants of Health   Financial Resource Strain:   . Difficulty of Paying Living Expenses:   Food  Insecurity:   . Worried About Programme researcher, broadcasting/film/video in the Last Year:   . Barista in the Last Year:   Transportation Needs:   . Freight forwarder (Medical):   Marland Kitchen Lack of Transportation (Non-Medical):   Physical Activity:   . Days of Exercise per Week:   . Minutes of Exercise per Session:   Stress:   . Feeling of Stress :   Social Connections:   . Frequency of Communication with Friends and Family:   . Frequency of Social Gatherings with Friends and Family:   . Attends Religious Services:   . Active Member of Clubs or Organizations:   . Attends Banker Meetings:   Marland Kitchen Marital Status:   Intimate Partner Violence:   . Fear of Current or Ex-Partner:   . Emotionally Abused:   Marland Kitchen Physically Abused:   . Sexually Abused:      Physical Exam   Vitals:   08/13/19 1025  BP: (!) 161/80  Pulse: 61  Resp: 20  Temp: 97.9 F (36.6 C)  SpO2: 100%    CONSTITUTIONAL: Well-appearing, NAD NEURO:  Alert and oriented x 3, no focal deficits EYES:  eyes equal and reactive ENT/NECK:  no LAD, no JVD CARDIO: Regular rate, well-perfused, normal S1 and S2 PULM:  CTAB  no wheezing or rhonchi GI/GU:  normal bowel sounds, non-distended, non-tender MSK/SPINE:  No gross deformities, no edema SKIN:  no rash, atraumatic PSYCH:  Appropriate speech and behavior  *Additional and/or pertinent findings included in MDM below  Diagnostic and Interventional Summary    EKG Interpretation  Date/Time:    Ventricular Rate:    PR Interval:    QRS Duration:   QT Interval:    QTC Calculation:   R Axis:     Text Interpretation:        Labs Reviewed - No data to display  DG Knee Complete 4 Views Left  Final Result    CT CERVICAL SPINE WO CONTRAST  Final Result    CT HEAD WO CONTRAST  Final Result      Medications  acetaminophen (TYLENOL) tablet 1,000 mg (1,000 mg Oral Given 08/13/19 1053)  lidocaine (PF) (XYLOCAINE) 1 % injection 5 mL (5 mLs Other Given by Other 08/13/19 1054)       Procedures  /  Critical Care .Marland KitchenLaceration Repair  Date/Time: 08/13/2019 11:57 AM Performed by: Sabas Sous, MD Authorized by: Sabas Sous, MD   Consent:    Consent obtained:  Verbal   Consent given by:  Patient   Risks discussed:  Infection, need for additional repair, nerve damage, pain, poor cosmetic result, poor wound healing and retained foreign body Anesthesia (see MAR for exact dosages):    Anesthesia method:  Local infiltration   Local anesthetic:  Lidocaine 1% w/o epi Laceration details:    Location:  Face   Face location:  L eyebrow   Length (cm):  2   Depth (mm):  2 Repair type:    Repair type:  Simple Pre-procedure details:    Preparation:  Patient was prepped and draped in usual sterile fashion Exploration:    Hemostasis achieved with:  Direct pressure   Wound exploration: wound explored through full range of motion and entire depth of wound probed and visualized     Contaminated: no   Treatment:    Area cleansed with:  Saline   Amount of cleaning:  Standard Skin repair:    Repair method:  Sutures   Suture size:  5-0   Suture material:  Fast-absorbing gut   Suture technique:  Simple interrupted   Number of sutures:  4 Approximation:    Approximation:  Close Post-procedure details:    Dressing:  Open (no dressing)   Patient tolerance of procedure:  Tolerated well, no immediate complications    ED Course and Medical Decision Making  I have reviewed the triage vital signs, the nursing notes, and pertinent available records from the EMR.  Listed above are laboratory and imaging tests that I personally ordered, reviewed, and interpreted and then considered in my medical decision making (see below for details).      Mechanical fall, explains that she tripped over her walker.  Has had multiple falls recently, being evaluated by neurology with MRI imaging of the head and C-spine scheduled for next week.  Bruising and laceration to the left brow  will need laceration repair, awaiting CT imaging and x-ray of the knee.  Imaging is reassuring, laceration repaired as described above, appropriate for discharge.  Patient offered case management consultation given her frequent falls and she declined, feels well cared for and safe at home.  Elmer Sow. Pilar Plate, MD [facility] Emergency Medicine [facility] [email or web address]  Final Clinical Impressions(s) / ED Diagnoses     ICD-10-CM  1. Laceration of brow without complication, initial encounter  S01.81XA   2. Fall, initial encounter  W19.XXXA CT HEAD WO CONTRAST    CT HEAD WO CONTRAST    ED Discharge Orders    None       Discharge Instructions Discussed with and Provided to Patient:     Discharge Instructions     You were evaluated in the Emergency Department and after careful evaluation, we did not find any emergent condition requiring admission or further testing in the hospital.  Your exam/testing today was overall reassuring.  We repaired your laceration here in the emergency department with absorbable sutures and they do not need to be removed.  Please return to the Emergency Department if you experience any worsening of your condition.  Thank you for allowing [place] to be a part of your care.        Sabas Sous, MD 08/13/19 1159

## 2019-08-13 NOTE — ED Notes (Signed)
ED Provider at bedside. 

## 2019-08-13 NOTE — ED Notes (Signed)
Left eye is bruised and swollen.

## 2019-08-13 NOTE — Discharge Instructions (Addendum)
You were evaluated in the Emergency Department and after careful evaluation, we did not find any emergent condition requiring admission or further testing in the hospital.  Your exam/testing today was overall reassuring.  We repaired your laceration here in the emergency department with absorbable sutures and they do not need to be removed.  Please return to the Emergency Department if you experience any worsening of your condition.  Thank you for allowing [place] to be a part of your care.

## 2019-08-13 NOTE — ED Triage Notes (Signed)
Pt arrives pov c/o fall at 0430 today. Pt reports tripping over walker, denies loc, denies taking blood thinners. Lac and bruising noted over left eye, also c/o left knee pain. Pt also reports HA

## 2019-08-15 ENCOUNTER — Encounter (HOSPITAL_BASED_OUTPATIENT_CLINIC_OR_DEPARTMENT_OTHER): Payer: Self-pay | Admitting: *Deleted

## 2019-08-15 ENCOUNTER — Other Ambulatory Visit: Payer: Self-pay

## 2019-08-15 ENCOUNTER — Inpatient Hospital Stay (HOSPITAL_BASED_OUTPATIENT_CLINIC_OR_DEPARTMENT_OTHER)
Admission: EM | Admit: 2019-08-15 | Discharge: 2019-08-17 | DRG: 244 | Disposition: A | Discharge status: Home / Self Care | Payer: Medicare Other | Attending: Cardiology | Admitting: Cardiology

## 2019-08-15 ENCOUNTER — Emergency Department (HOSPITAL_BASED_OUTPATIENT_CLINIC_OR_DEPARTMENT_OTHER): Payer: Medicare Other

## 2019-08-15 DIAGNOSIS — Z7982 Long term (current) use of aspirin: Secondary | ICD-10-CM

## 2019-08-15 DIAGNOSIS — E890 Postprocedural hypothyroidism: Secondary | ICD-10-CM | POA: Diagnosis present

## 2019-08-15 DIAGNOSIS — Z791 Long term (current) use of non-steroidal anti-inflammatories (NSAID): Secondary | ICD-10-CM

## 2019-08-15 DIAGNOSIS — Z20822 Contact with and (suspected) exposure to covid-19: Secondary | ICD-10-CM | POA: Diagnosis present

## 2019-08-15 DIAGNOSIS — Z79899 Other long term (current) drug therapy: Secondary | ICD-10-CM

## 2019-08-15 DIAGNOSIS — S01112A Laceration without foreign body of left eyelid and periocular area, initial encounter: Secondary | ICD-10-CM | POA: Diagnosis present

## 2019-08-15 DIAGNOSIS — Z79890 Hormone replacement therapy (postmenopausal): Secondary | ICD-10-CM

## 2019-08-15 DIAGNOSIS — I495 Sick sinus syndrome: Secondary | ICD-10-CM | POA: Diagnosis present

## 2019-08-15 DIAGNOSIS — I447 Left bundle-branch block, unspecified: Secondary | ICD-10-CM | POA: Diagnosis present

## 2019-08-15 DIAGNOSIS — R339 Retention of urine, unspecified: Secondary | ICD-10-CM | POA: Diagnosis present

## 2019-08-15 DIAGNOSIS — R079 Chest pain, unspecified: Secondary | ICD-10-CM

## 2019-08-15 DIAGNOSIS — R296 Repeated falls: Secondary | ICD-10-CM | POA: Diagnosis present

## 2019-08-15 DIAGNOSIS — E119 Type 2 diabetes mellitus without complications: Secondary | ICD-10-CM | POA: Diagnosis present

## 2019-08-15 DIAGNOSIS — Z794 Long term (current) use of insulin: Secondary | ICD-10-CM

## 2019-08-15 DIAGNOSIS — Z90710 Acquired absence of both cervix and uterus: Secondary | ICD-10-CM

## 2019-08-15 DIAGNOSIS — Z95818 Presence of other cardiac implants and grafts: Secondary | ICD-10-CM

## 2019-08-15 DIAGNOSIS — I10 Essential (primary) hypertension: Secondary | ICD-10-CM | POA: Diagnosis present

## 2019-08-15 DIAGNOSIS — W1830XA Fall on same level, unspecified, initial encounter: Secondary | ICD-10-CM | POA: Diagnosis present

## 2019-08-15 DIAGNOSIS — F329 Major depressive disorder, single episode, unspecified: Secondary | ICD-10-CM | POA: Diagnosis present

## 2019-08-15 DIAGNOSIS — Y92009 Unspecified place in unspecified non-institutional (private) residence as the place of occurrence of the external cause: Secondary | ICD-10-CM

## 2019-08-15 DIAGNOSIS — I455 Other specified heart block: Principal | ICD-10-CM | POA: Diagnosis present

## 2019-08-15 LAB — BASIC METABOLIC PANEL
Anion gap: 10 (ref 5–15)
BUN: 15 mg/dL (ref 8–23)
CO2: 25 mmol/L (ref 22–32)
Calcium: 7.2 mg/dL — ABNORMAL LOW (ref 8.9–10.3)
Chloride: 100 mmol/L (ref 98–111)
Creatinine, Ser: 1.12 mg/dL — ABNORMAL HIGH (ref 0.44–1.00)
GFR calc Af Amer: 57 mL/min — ABNORMAL LOW (ref >60)
GFR calc non Af Amer: 49 mL/min — ABNORMAL LOW (ref >60)
Glucose, Bld: 201 mg/dL — ABNORMAL HIGH (ref 70–99)
Potassium: 3.9 mmol/L (ref 3.5–5.1)
Sodium: 135 mmol/L (ref 135–145)

## 2019-08-15 LAB — CBC
HCT: 33 % — ABNORMAL LOW (ref 36.0–46.0)
Hemoglobin: 10.4 g/dL — ABNORMAL LOW (ref 12.0–15.0)
MCH: 26.6 pg (ref 26.0–34.0)
MCHC: 31.5 g/dL (ref 30.0–36.0)
MCV: 84.4 fL (ref 80.0–100.0)
Platelets: 285 10*3/uL (ref 150–400)
RBC: 3.91 MIL/uL (ref 3.87–5.11)
RDW: 15.8 % — ABNORMAL HIGH (ref 11.5–15.5)
WBC: 5.6 10*3/uL (ref 4.0–10.5)
nRBC: 0 % (ref 0.0–0.2)

## 2019-08-15 LAB — TROPONIN I (HIGH SENSITIVITY)
Troponin I (High Sensitivity): 67 ng/L — ABNORMAL HIGH (ref <18)
Troponin I (High Sensitivity): 64 ng/L — ABNORMAL HIGH (ref <18)

## 2019-08-15 LAB — SARS CORONAVIRUS 2 BY RT PCR (HOSPITAL ORDER, PERFORMED IN ~~LOC~~ HOSPITAL LAB): SARS Coronavirus 2: NEGATIVE

## 2019-08-15 MED ORDER — NITROGLYCERIN 0.4 MG SL SUBL
0.4000 mg | SUBLINGUAL_TABLET | SUBLINGUAL | Status: DC | PRN
  Administered 2019-08-15 – 2019-08-16 (×3): 0.4 mg via SUBLINGUAL
  Filled 2019-08-15 (×3): qty 1

## 2019-08-15 MED ORDER — ONDANSETRON HCL 4 MG/2ML IJ SOLN
4.0000 mg | Freq: Once | INTRAMUSCULAR | Status: AC
  Administered 2019-08-15: 4 mg via INTRAVENOUS
  Filled 2019-08-15: qty 2

## 2019-08-15 MED ORDER — ASPIRIN 81 MG PO CHEW
324.0000 mg | CHEWABLE_TABLET | Freq: Once | ORAL | Status: AC
  Administered 2019-08-15: 324 mg via ORAL
  Filled 2019-08-15: qty 4

## 2019-08-15 MED ORDER — SODIUM CHLORIDE 0.9% FLUSH
3.0000 mL | Freq: Once | INTRAVENOUS | Status: DC
  Filled 2019-08-15: qty 3

## 2019-08-15 MED ORDER — MORPHINE SULFATE (PF) 2 MG/ML IV SOLN
2.0000 mg | Freq: Once | INTRAVENOUS | Status: AC
  Administered 2019-08-15: 2 mg via INTRAVENOUS
  Filled 2019-08-15: qty 1

## 2019-08-15 NOTE — ED Provider Notes (Cosign Needed)
[facility] EMERGENCY DEPARTMENT Provider Note   CSN: [id number] Arrival date & time: 08/15/19  1652     History Chief Complaint  Patient presents with  . Chest Pain    Jillian Leblanc is a 74 y.o. female for symptoms of diabetes, interstitial cystitis, thyroid disease who presents today for evaluation of chest pain and concern for abnormal Holter monitor.  Patient reports that over the last several months, she has had frequent falls.  She reports greater than 20+ falls that she has had.  She has been working with her primary care doctor to determine the reason of this fall.  She has seen neurology and has an MRI scheduled for 08/20/19.  Her family member does report that she feels like the falls are getting worse and becoming more frequent.  She does also feel like that her mother's memory is getting worse.  She reports that she was referred to Dr. Hanley Hays (cardiology) for evaluation of any cardiac etiology as to what was causing her falls.  She had a stress test done today and was given a Holter monitor.  She reports that she was called this afternoon and told that her Holter monitor showed an abnormal activity and that she should go to the emergency department.  She does not know what the abnormal activity was.  She does endorse that she has had some left-sided chest pain since yesterday.  She describes it as a heaviness over her chest.  She did sustain a fall in 08/13/2019 so she initially thought it was just soreness from the fall.  She describes it as a heaviness.  She states it is not worse with exertion.  She does feel like it is slightly worse when she takes a deep breath in but she has not had trouble breathing.  She has reported some associated nausea.  She states it is not associated with any diaphoresis, vomiting.  She does report that she has fallen since last seen in the ED on 7/20/1.  She does report report a fall yesterday.  Unsure if she hit her head.  She states she did not  have any LOC.  She states that she does not have any warning of these falls.  She denies any recent fever.  She has had a slight cough but states that has been ongoing issue.  Denies any abdominal pain, vomiting.  She denies any history of smoking, alcohol use.  Denies any personal cardiac history or family history of MI before the age of 12.  She has diabetes but denies any history of hypertension.   The history is provided by the patient.       Past Medical History:  Diagnosis Date  . Diabetes mellitus without complication (HCC)   . Interstitial cystitis   . Thyroid disease     Patient Active Problem List   Diagnosis Date Noted  . 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 Head injury 06/26/2019  . Frequent falls 06/26/2019    Past Surgical History:  Procedure Laterality Date  . ABDOMINAL HYSTERECTOMY    . CHOLECYSTECTOMY    . NECK SURGERY    . SHOULDER SURGERY    . THYROIDECTOMY       OB History   No obstetric history on file.     No family history on file.  Social History   Tobacco Use  . Smoking status: Never Smoker  . Smokeless tobacco: Never Used  Substance Use Topics  .  Alcohol use: No  . Drug use: Never    Home Medications Prior to Admission medications   Medication Sig Start Date End Date Taking? Authorizing Provider  amitriptyline (ELAVIL) 50 MG tablet Take 50 mg by mouth at bedtime. 05/08/17   [provider]  ASPIRIN LOW DOSE 81 MG EC tablet Take 81 mg by mouth daily. 06/16/19   [provider]  calcium carbonate (TUMS EX) 750 MG chewable tablet Chew by mouth. 04/10/11   [provider]  citalopram (CELEXA) 20 MG tablet Take 20 mg by mouth at bedtime. 10/03/14   [provider]  diclofenac Sodium (VOLTAREN) 1 % GEL Apply topically as needed. 07/23/16   [provider]  fluticasone (FLONASE) 50 MCG/ACT nasal spray as needed. 06/09/17   [provider]  hydrOXYzine (ATARAX/VISTARIL) 25 MG tablet Take 1-2 tablets by mouth every evening. 06/14/19   [provider]  ibuprofen (ADVIL) 600 MG tablet Take 600 mg by mouth 3 (three) times daily. 06/11/19   [provider]  insulin glargine, 1 Unit Dial, (TOUJEO) 300 UNIT/ML Solostar Pen Inject 15 Units into the skin daily.    [provider]  levothyroxine (SYNTHROID) 100 MCG tablet Take 100 mcg by mouth daily. 05/14/19   [provider]  metFORMIN (GLUCOPHAGE) 500 MG tablet Take 1,000 mg by mouth in the morning and at bedtime.    [provider]  omeprazole (PRILOSEC) 40 MG capsule Take 40 mg by mouth as needed. 03/17/18   [provider]  ondansetron (ZOFRAN) 4 MG tablet Take 4 mg by mouth every 8 (eight) hours as needed for nausea.    [provider]  oxybutynin (DITROPAN-XL) 10 MG 24 hr tablet Take 10 mg by mouth daily. 06/11/19   [provider]  oxyCODONE-acetaminophen (PERCOCET) 10-325 MG per tablet Take 1 tablet by mouth every 4 (four) hours as needed for pain.    [provider]  promethazine (PHENERGAN) 25 MG tablet Take 25 mg by mouth as needed. 09/28/16   [provider]  rOPINIRole (REQUIP) 3 MG tablet Take 3 mg by mouth at bedtime. 06/08/19   [provider]    Allergies    Pentosan polysulfate sodium, Elmiron [pentosan polysulfate], and Naloxone  Review of Systems   Review of Systems  Constitutional: Negative for fever.  Respiratory: Negative for cough and shortness of breath.   Cardiovascular: Positive for chest pain.  Gastrointestinal: Negative for abdominal pain, nausea and vomiting.  Genitourinary: Negative for dysuria and hematuria.  Neurological: Negative for headaches.  All other systems reviewed and are negative.   Physical Exam Updated Vital Signs BP (!) 145/72 (BP Location: Right Arm)   Pulse 62   Temp 98 F (36.7 C) (Oral)   Resp (!) 24   Ht 5' (1.524 m)   Wt 55.6 kg    SpO2 98%   BMI 23.92 kg/m   Physical Exam Vitals and nursing note reviewed.  Constitutional:      Appearance: Normal appearance. She is well-developed.  HENT:     Head: Normocephalic.     Comments: No tenderness to palpation of skull. No deformities or crepitus noted. No open wounds, abrasions or lacerations.  There is evidence of ecchymosis to the left face that appears subacute in nature. (Consistent with previous fall).  Eyes:     General: Lids are normal.     Conjunctiva/sclera: Conjunctivae normal.     Pupils: Pupils are equal, round, and reactive to light.  Neck:  Comments: Full flexion/extension and lateral movement of neck fully intact. No bony midline tenderness. No deformities or crepitus.  Cardiovascular:     Rate and Rhythm: Normal rate and regular rhythm.     Pulses: Normal pulses.          Radial pulses are 2+ on the right side and 2+ on the left side.       Dorsalis pedis pulses are 2+ on the right side and 2+ on the left side.     Heart sounds: Normal heart sounds. No murmur heard.  No friction rub. No gallop.   Pulmonary:     Effort: Pulmonary effort is normal.     Breath sounds: Normal breath sounds.     Comments: Lungs clear to auscultation bilaterally.  Symmetric chest rise.  No wheezing, rales, rhonchi. Chest:       Comments: Tenderness palpation noted to anterior left chest wall.  No deformity or crepitus noted.  No evidence of flail chest.  There is some slight ecchymosis overlying. Abdominal:     Palpations: Abdomen is soft. Abdomen is not rigid.     Tenderness: There is no abdominal tenderness. There is no guarding.     Comments: Abdomen is soft, non-distended, non-tender. No rigidity, No guarding. No peritoneal signs.  Musculoskeletal:        General: Normal range of motion.     Cervical back: Full passive range of motion without pain.  Skin:    General: Skin is warm and dry.     Capillary Refill: Capillary refill takes less than 2 seconds.    Neurological:     Mental Status: She is alert and oriented to person, place, and time.  Psychiatric:        Speech: Speech normal.     ED Results / Procedures / Treatments   Labs (all labs ordered are listed, but only abnormal results are displayed) Labs Reviewed  BASIC METABOLIC PANEL - Abnormal; Notable for the following components:      Result Value   Glucose, Bld 201 (*)    Creatinine, Ser 1.12 (*)    Calcium 7.2 (*)    GFR calc non Af Amer 49 (*)    GFR calc Af Amer 57 (*)    All other components within normal limits  CBC - Abnormal; Notable for the following components:   Hemoglobin 10.4 (*)    HCT 33.0 (*)    RDW 15.8 (*)    All other components within normal limits  TROPONIN I (HIGH SENSITIVITY) - Abnormal; Notable for the following components:   Troponin I (High Sensitivity) 67 (*)    All other components within normal limits  TROPONIN I (HIGH SENSITIVITY)    EKG None   EKG is unfortunately not crossing over.  EKG so sinus rhythm, rate 73.  QTc is 464.  Radiology DG Chest 2 View  Result Date: 08/15/2019 CLINICAL DATA:  Chest pain. EXAM: CHEST - 2 VIEW COMPARISON:  Chest x-ray dated Jun 22, 2019. FINDINGS: The heart size and mediastinal contours are within normal limits. Atherosclerotic calcification of the aortic arch. Normal pulmonary vascularity. Linear atelectasis in the right upper lobe and lingula. No focal consolidation, pleural effusion, or pneumothorax. No acute osseous abnormality. Chronic T12 compression deformity. IMPRESSION: 1. No acute cardiopulmonary disease. Electronically Signed   By: Obie Dredge M.D.   On: 08/15/2019 17:55    Procedures Procedures (including critical care time)  Medications Ordered in ED Medications  sodium chloride flush (NS) 0.9 %  injection 3 mL (3 mLs Intravenous Not Given 08/15/19 1723)  ondansetron (ZOFRAN) injection 4 mg (4 mg Intravenous Given 08/15/19 1839)  morphine 2 MG/ML injection 2 mg (2 mg Intravenous Given  08/15/19 1842)    ED Course  I have reviewed the triage vital signs and the nursing notes.  Pertinent labs & imaging results that were available during my care of the patient were reviewed by me and considered in my medical decision making (see chart for details).    MDM Rules/Calculators/A&P                          74 year old female who presents for evaluation of left-sided chest pain.  She reports been ongoing since yesterday.  Additionally, she came in because she had a Holter monitor placed today and was told by the office that she had an abnormal event and was told to come to the emergency department.  She reports that she has had a Holter monitor to determine what is causing her to have falls.  She reports that over the last several months, she has had more frequent falls.  She was seen here in 08/13/19 for evaluation of fall.  She does report she has had a fall since then.  She has seen neurology.  On initially arrival, she is afebrile, nontoxic-appearing.  Vital signs are stable.  On exam, she has some tenderness palpation of the left anterior chest wall.  Will consult her cardiologist for evaluation of what event occurred.  Additionally, will plan for labs, EKG, chest x-ray.  BMP shows BUN of 15, creatinine of 1.12.  CBC shows no leukocytosis.  Hemoglobin stable at 10.4.  Chest x-ray shows no acute pulmonary abnormalities. Trop is 67.  Will plan to Consult patients' cardiologist.   Patient signed out to Solon AugustaWylder Fondaw, PA-C with labs and cardiology evaluation pending.   Portions of this note were generated with Scientist, clinical (histocompatibility and immunogenetics)Dragon dictation software. Dictation errors may occur despite best attempts at proofreading.  Final Clinical Impression(s) / ED Diagnoses Final diagnoses:  None    Rx / DC Orders ED Discharge Orders    None       Maxwell CaulLayden, Vivan Vanderveer A, PA-C 08/15/19 1927

## 2019-08-15 NOTE — ED Provider Notes (Addendum)
Accepted handoff at shift change from [employer]. Please see prior provider note for more detail.   Briefly: [name] 74 y.o. with recent frequent falls for 2 months presenting today by cardiology because she was found to have sinus arrest for as long as 5 seconds. These may very well be causing her falls. Pt is not able to be very specific with details on her prodrome sx.     Plan to followup on cardiology recs from Dr. Hanley Hays of [facility] medical cardiology.   Physical Exam  BP (!) 145/72 (BP Location: Right Arm)   Pulse 62   Temp 98 F (36.7 C) (Oral)   Resp (!) 24   Ht 5' (1.524 m)   Wt 55.6 kg   SpO2 98%   BMI 23.92 kg/m   Physical Exam  ED Course/Procedures   Clinical Course as of Aug 14 2099  Thu Aug 15, 2019  2024 Second troponin is slightly lower than prior.  Per Dr. Bjorn Pippin recommendations will hold off on heparin at this time. Patient will be sent to [facility] for cardiology direct admission to Dr. Bjorn Pippin will place admission orders.  EP will see patient in consultation.  Charge nurse made aware of this plan and [facility] as well.  This plan was enacted at 730 p.m.   [WF]    Clinical Course User Index [WF] Gailen Shelter, [place]    Procedures  Results for orders placed or performed during the hospital encounter of 08/15/19  Basic metabolic panel  Result Value Ref Range   Sodium 135 135 - 145 mmol/L   Potassium 3.9 3.5 - 5.1 mmol/L   Chloride 100 98 - 111 mmol/L   CO2 25 22 - 32 mmol/L   Glucose, Bld 201 (H) 70 - 99 mg/dL   BUN 15 8 - 23 mg/dL   Creatinine, Ser 0.30 (H) 0.44 - 1.00 mg/dL   Calcium 7.2 (L) 8.9 - 10.3 mg/dL   GFR calc non Af Amer 49 (L) >60 mL/min   GFR calc Af Amer 57 (L) >60 mL/min   Anion gap 10 5 - 15  CBC  Result Value Ref Range   WBC 5.6 4.0 - 10.5 K/uL   RBC 3.91 3.87 - 5.11 MIL/uL   Hemoglobin 10.4 (L) 12.0 - 15.0 g/dL   HCT 09.2 (L) 36 - 46 %   MCV 84.4 80.0 - 100.0 fL   MCH 26.6 26.0 - 34.0 pg   MCHC 31.5 30.0 - 36.0  g/dL   RDW 33.0 (H) 07.6 - 22.6 %   Platelets 285 150 - 400 K/uL   nRBC 0.0 0.0 - 0.2 %  Troponin I (High Sensitivity)  Result Value Ref Range   Troponin I (High Sensitivity) 67 (H) <18 ng/L     MDM   Call received from Arnette Felts PA-C of cardiology works with Dr. Metta Clines has had holter monitor placed today and was called and directed to [facility] Emergency Department because she was having up to 5 seconds of sinus arrest on monitor. Given her frequent falls concern this may be the cause. Recommends admission and consultation with cards and EP.   I consulted and discussed w cardiology who will admit. Recommends hold off on heparin unless 2nd trop is flat. Will give ASA and 1 dose of nitroglycerin.   Patient admitted to cardiology.  Dr. Bjorn Pippin or Dr. Charna Busman will admit. Discussed w charge.  Temporary admission orders placed by myself.  Patient given 1 nitroglycerin and  aspirin.  Will not initiate heparin per cardiology recommendations will transfer to [facility] for cardiology admission and EP consultation.    Gailen Shelter, [place] 08/16/19 0336    Gailen Shelter, PA 08/16/19 4709    Rolan Bucco, MD 08/16/19 [phone]

## 2019-08-15 NOTE — ED Triage Notes (Signed)
She is here with chest pain. She was placed on a holter monitor today after a stress test. She was told they saw an abnormality on her monitor and advised her to come to the ED.

## 2019-08-15 NOTE — ED Notes (Signed)
Pt requesting diet coke and graham crackers. OK per RN Emilie. Pt given the same

## 2019-08-16 ENCOUNTER — Inpatient Hospital Stay (HOSPITAL_COMMUNITY): Payer: Medicare Other

## 2019-08-16 ENCOUNTER — Encounter (HOSPITAL_COMMUNITY): Payer: Self-pay | Admitting: Cardiology

## 2019-08-16 ENCOUNTER — Inpatient Hospital Stay (HOSPITAL_COMMUNITY)
Admission: EM | Disposition: A | Discharge status: Home / Self Care | Payer: Self-pay | Source: Home / Self Care | Attending: Cardiology

## 2019-08-16 DIAGNOSIS — Z79899 Other long term (current) drug therapy: Secondary | ICD-10-CM | POA: Diagnosis not present

## 2019-08-16 DIAGNOSIS — Y92009 Unspecified place in unspecified non-institutional (private) residence as the place of occurrence of the external cause: Secondary | ICD-10-CM | POA: Diagnosis not present

## 2019-08-16 DIAGNOSIS — I495 Sick sinus syndrome: Secondary | ICD-10-CM

## 2019-08-16 DIAGNOSIS — I10 Essential (primary) hypertension: Secondary | ICD-10-CM | POA: Diagnosis present

## 2019-08-16 DIAGNOSIS — Z20822 Contact with and (suspected) exposure to covid-19: Secondary | ICD-10-CM | POA: Diagnosis present

## 2019-08-16 DIAGNOSIS — R296 Repeated falls: Secondary | ICD-10-CM | POA: Diagnosis present

## 2019-08-16 DIAGNOSIS — W1830XA Fall on same level, unspecified, initial encounter: Secondary | ICD-10-CM | POA: Diagnosis present

## 2019-08-16 DIAGNOSIS — R9431 Abnormal electrocardiogram [ECG] [EKG]: Secondary | ICD-10-CM

## 2019-08-16 DIAGNOSIS — Z794 Long term (current) use of insulin: Secondary | ICD-10-CM | POA: Diagnosis not present

## 2019-08-16 DIAGNOSIS — E890 Postprocedural hypothyroidism: Secondary | ICD-10-CM | POA: Diagnosis present

## 2019-08-16 DIAGNOSIS — Z7982 Long term (current) use of aspirin: Secondary | ICD-10-CM | POA: Diagnosis not present

## 2019-08-16 DIAGNOSIS — Z79890 Hormone replacement therapy (postmenopausal): Secondary | ICD-10-CM | POA: Diagnosis not present

## 2019-08-16 DIAGNOSIS — I447 Left bundle-branch block, unspecified: Secondary | ICD-10-CM | POA: Diagnosis present

## 2019-08-16 DIAGNOSIS — I455 Other specified heart block: Secondary | ICD-10-CM | POA: Diagnosis present

## 2019-08-16 DIAGNOSIS — F329 Major depressive disorder, single episode, unspecified: Secondary | ICD-10-CM | POA: Diagnosis present

## 2019-08-16 DIAGNOSIS — R339 Retention of urine, unspecified: Secondary | ICD-10-CM | POA: Diagnosis present

## 2019-08-16 DIAGNOSIS — E119 Type 2 diabetes mellitus without complications: Secondary | ICD-10-CM | POA: Diagnosis present

## 2019-08-16 DIAGNOSIS — Z791 Long term (current) use of non-steroidal anti-inflammatories (NSAID): Secondary | ICD-10-CM | POA: Diagnosis not present

## 2019-08-16 DIAGNOSIS — S01112A Laceration without foreign body of left eyelid and periocular area, initial encounter: Secondary | ICD-10-CM | POA: Diagnosis present

## 2019-08-16 DIAGNOSIS — Z90710 Acquired absence of both cervix and uterus: Secondary | ICD-10-CM | POA: Diagnosis not present

## 2019-08-16 HISTORY — PX: PACEMAKER IMPLANT: EP1218

## 2019-08-16 LAB — ECHOCARDIOGRAM COMPLETE
AR max vel: 2.03 cm2
AV Area VTI: 2.31 cm2
AV Area mean vel: 2.01 cm2
AV Mean grad: 3 mmHg
AV Peak grad: 7.1 mmHg
Ao pk vel: 1.33 m/s
Height: 60 in
S' Lateral: 2.9 cm
Weight: 1940.05 oz

## 2019-08-16 LAB — SURGICAL PCR SCREEN
MRSA, PCR: POSITIVE — AB
Staphylococcus aureus: POSITIVE — AB

## 2019-08-16 LAB — HEPATIC FUNCTION PANEL
ALT: 13 U/L (ref 0–44)
AST: 18 U/L (ref 15–41)
Albumin: 3.2 g/dL — ABNORMAL LOW (ref 3.5–5.0)
Alkaline Phosphatase: 52 U/L (ref 38–126)
Bilirubin, Direct: <0.1 mg/dL (ref 0.0–0.2)
Total Bilirubin: 0.3 mg/dL (ref 0.3–1.2)
Total Protein: 6.5 g/dL (ref 6.5–8.1)

## 2019-08-16 LAB — BASIC METABOLIC PANEL
Anion gap: 9 (ref 5–15)
BUN: 11 mg/dL (ref 8–23)
CO2: 25 mmol/L (ref 22–32)
Calcium: 7 mg/dL — ABNORMAL LOW (ref 8.9–10.3)
Chloride: 102 mmol/L (ref 98–111)
Creatinine, Ser: 1.13 mg/dL — ABNORMAL HIGH (ref 0.44–1.00)
GFR calc Af Amer: 56 mL/min — ABNORMAL LOW (ref >60)
GFR calc non Af Amer: 49 mL/min — ABNORMAL LOW (ref >60)
Glucose, Bld: 143 mg/dL — ABNORMAL HIGH (ref 70–99)
Potassium: 3.8 mmol/L (ref 3.5–5.1)
Sodium: 136 mmol/L (ref 135–145)

## 2019-08-16 LAB — GLUCOSE, CAPILLARY
Glucose-Capillary: 185 mg/dL — ABNORMAL HIGH (ref 70–99)
Glucose-Capillary: 114 mg/dL — ABNORMAL HIGH (ref 70–99)
Glucose-Capillary: 139 mg/dL — ABNORMAL HIGH (ref 70–99)
Glucose-Capillary: 159 mg/dL — ABNORMAL HIGH (ref 70–99)

## 2019-08-16 LAB — HEMOGLOBIN A1C
Hgb A1c MFr Bld: 7.6 % — ABNORMAL HIGH (ref 4.8–5.6)
Mean Plasma Glucose: 171.42 mg/dL

## 2019-08-16 LAB — TSH: TSH: 11.133 u[IU]/mL — ABNORMAL HIGH (ref 0.350–4.500)

## 2019-08-16 LAB — T4, FREE: Free T4: 0.82 ng/dL (ref 0.61–1.12)

## 2019-08-16 SURGERY — PACEMAKER IMPLANT

## 2019-08-16 MED ORDER — ONDANSETRON HCL 4 MG/2ML IJ SOLN: 4.0000 mg | Freq: Four times a day (QID) | INTRAMUSCULAR | Status: DC | PRN

## 2019-08-16 MED ORDER — HEPARIN (PORCINE) IN NACL 1000-0.9 UT/500ML-% IV SOLN
INTRAVENOUS | Status: DC | PRN
  Administered 2019-08-16: 500 mL

## 2019-08-16 MED ORDER — FENTANYL CITRATE (PF) 100 MCG/2ML IJ SOLN
INTRAMUSCULAR | Status: DC | PRN
  Administered 2019-08-16 (×2): 25 ug via INTRAVENOUS

## 2019-08-16 MED ORDER — OXYCODONE-ACETAMINOPHEN 10-325 MG PO TABS: 1.0000 | ORAL_TABLET | Freq: Three times a day (TID) | ORAL | Status: DC | PRN

## 2019-08-16 MED ORDER — ASPIRIN EC 81 MG PO TBEC
81.0000 mg | DELAYED_RELEASE_TABLET | Freq: Every day | ORAL | Status: DC
  Administered 2019-08-17: 81 mg via ORAL
  Filled 2019-08-16 (×2): qty 1

## 2019-08-16 MED ORDER — HEPARIN (PORCINE) IN NACL 1000-0.9 UT/500ML-% IV SOLN
INTRAVENOUS | Status: AC
  Filled 2019-08-16: qty 500

## 2019-08-16 MED ORDER — LIDOCAINE HCL (PF) 1 % IJ SOLN
INTRAMUSCULAR | Status: DC | PRN
  Administered 2019-08-16: 50 mL

## 2019-08-16 MED ORDER — SODIUM CHLORIDE 0.9 % IV SOLN: INTRAVENOUS | Status: DC

## 2019-08-16 MED ORDER — OXYBUTYNIN CHLORIDE ER 10 MG PO TB24
10.0000 mg | ORAL_TABLET | Freq: Every day | ORAL | Status: DC
  Administered 2019-08-16 – 2019-08-17 (×2): 10 mg via ORAL
  Filled 2019-08-16 (×2): qty 1

## 2019-08-16 MED ORDER — HYDROXYZINE HCL 25 MG PO TABS
25.0000 mg | ORAL_TABLET | Freq: Every evening | ORAL | Status: DC
  Administered 2019-08-16: 25 mg via ORAL
  Filled 2019-08-16: qty 1

## 2019-08-16 MED ORDER — OXYCODONE-ACETAMINOPHEN 5-325 MG PO TABS
1.0000 | ORAL_TABLET | Freq: Three times a day (TID) | ORAL | Status: DC | PRN
  Administered 2019-08-16: 1 via ORAL
  Filled 2019-08-16: qty 1

## 2019-08-16 MED ORDER — CHLORHEXIDINE GLUCONATE 4 % EX LIQD: 60.0000 mL | Freq: Once | CUTANEOUS | Status: DC

## 2019-08-16 MED ORDER — OXYCODONE HCL 5 MG PO TABS
5.0000 mg | ORAL_TABLET | Freq: Three times a day (TID) | ORAL | Status: DC | PRN
  Administered 2019-08-16: 5 mg via ORAL
  Filled 2019-08-16: qty 1

## 2019-08-16 MED ORDER — MIDAZOLAM HCL 5 MG/5ML IJ SOLN
INTRAMUSCULAR | Status: AC
  Filled 2019-08-16: qty 5

## 2019-08-16 MED ORDER — CEFAZOLIN SODIUM-DEXTROSE 2-4 GM/100ML-% IV SOLN
2.0000 g | INTRAVENOUS | Status: AC
  Administered 2019-08-16: 2 g via INTRAVENOUS
  Filled 2019-08-16: qty 100

## 2019-08-16 MED ORDER — ASPIRIN 300 MG RE SUPP: 300.0000 mg | RECTAL | Status: AC

## 2019-08-16 MED ORDER — ASPIRIN 81 MG PO CHEW
324.0000 mg | CHEWABLE_TABLET | ORAL | Status: AC
  Administered 2019-08-16: 324 mg via ORAL
  Filled 2019-08-16: qty 4

## 2019-08-16 MED ORDER — AMITRIPTYLINE HCL 25 MG PO TABS
50.0000 mg | ORAL_TABLET | Freq: Every day | ORAL | Status: DC
  Administered 2019-08-16 (×2): 50 mg via ORAL
  Filled 2019-08-16 (×2): qty 2

## 2019-08-16 MED ORDER — LEVOTHYROXINE SODIUM 100 MCG PO TABS
100.0000 ug | ORAL_TABLET | Freq: Every day | ORAL | Status: DC
  Administered 2019-08-16 – 2019-08-17 (×2): 100 ug via ORAL
  Filled 2019-08-16 (×2): qty 1

## 2019-08-16 MED ORDER — ACETAMINOPHEN 325 MG PO TABS: 325.0000 mg | ORAL_TABLET | ORAL | Status: DC | PRN

## 2019-08-16 MED ORDER — LIDOCAINE HCL 1 % IJ SOLN
INTRAMUSCULAR | Status: AC
  Filled 2019-08-16: qty 60

## 2019-08-16 MED ORDER — CITALOPRAM HYDROBROMIDE 20 MG PO TABS
20.0000 mg | ORAL_TABLET | Freq: Every day | ORAL | Status: DC
  Administered 2019-08-16 (×2): 20 mg via ORAL
  Filled 2019-08-16 (×2): qty 1

## 2019-08-16 MED ORDER — CEFAZOLIN SODIUM-DEXTROSE 1-4 GM/50ML-% IV SOLN
1.0000 g | Freq: Four times a day (QID) | INTRAVENOUS | Status: AC
  Administered 2019-08-16 – 2019-08-17 (×3): 1 g via INTRAVENOUS
  Filled 2019-08-16 (×3): qty 50

## 2019-08-16 MED ORDER — MUPIROCIN 2 % EX OINT
1.0000 "application " | TOPICAL_OINTMENT | Freq: Two times a day (BID) | CUTANEOUS | Status: DC
  Administered 2019-08-16 – 2019-08-17 (×3): 1 via NASAL
  Filled 2019-08-16 (×2): qty 22

## 2019-08-16 MED ORDER — CEFAZOLIN SODIUM-DEXTROSE 2-4 GM/100ML-% IV SOLN
INTRAVENOUS | Status: AC
  Filled 2019-08-16: qty 100

## 2019-08-16 MED ORDER — CHLORHEXIDINE GLUCONATE CLOTH 2 % EX PADS
6.0000 | MEDICATED_PAD | Freq: Every day | CUTANEOUS | Status: DC
  Administered 2019-08-17: 6 via TOPICAL

## 2019-08-16 MED ORDER — FENTANYL CITRATE (PF) 100 MCG/2ML IJ SOLN
INTRAMUSCULAR | Status: AC
  Filled 2019-08-16: qty 2

## 2019-08-16 MED ORDER — ROPINIROLE HCL 1 MG PO TABS: 3.0000 mg | ORAL_TABLET | Freq: Every day | ORAL | Status: DC

## 2019-08-16 MED ORDER — SODIUM CHLORIDE 0.9 % IV SOLN
80.0000 mg | INTRAVENOUS | Status: DC
  Administered 2019-08-16: 80 mg
  Filled 2019-08-16: qty 2

## 2019-08-16 MED ORDER — MIDAZOLAM HCL 5 MG/5ML IJ SOLN
INTRAMUSCULAR | Status: DC | PRN
  Administered 2019-08-16 (×2): 1 mg via INTRAVENOUS

## 2019-08-16 MED ORDER — SODIUM CHLORIDE 0.9 % IV SOLN
INTRAVENOUS | Status: AC
  Filled 2019-08-16: qty 2

## 2019-08-16 MED ORDER — ACETAMINOPHEN 325 MG PO TABS
650.0000 mg | ORAL_TABLET | Freq: Four times a day (QID) | ORAL | Status: DC | PRN
  Administered 2019-08-16 (×2): 650 mg via ORAL
  Filled 2019-08-16 (×2): qty 2

## 2019-08-16 SURGICAL SUPPLY — 8 items
CABLE SURGICAL S-101-97-12 (CABLE) ×3 IMPLANT
IPG PACE AZUR XT DR MRI W1DR01 (Pacemaker) IMPLANT
LEAD CAPSURE NOVUS 45CM (Lead) ×2 IMPLANT
LEAD CAPSURE NOVUS 5076-52CM (Lead) ×2 IMPLANT
PACE AZURE XT DR MRI W1DR01 (Pacemaker) ×3 IMPLANT
PAD PRO RADIOLUCENT 2001M-C (PAD) ×3 IMPLANT
SHEATH 7FR PRELUDE SNAP 13 (SHEATH) ×4 IMPLANT
TRAY PACEMAKER INSERTION (PACKS) ×3 IMPLANT

## 2019-08-16 NOTE — Discharge Instructions (Signed)
After Your Pacemaker   . You have a [employer]onic Pacemaker  ACTIVITY . Do not lift your arm above shoulder height for 1 week after your procedure. After 7 days, you may progress as below.     Friday August 23, 2019  Saturday August 24, 2019 Sunday August 25, 2019 Monday August 26, 2019   . Do not lift, push, pull, or carry anything over 10 pounds with the affected arm until 6 weeks (Friday September 27, 2019 ) after your procedure.   . Do NOT DRIVE until you have been seen for your wound check, or as long as instructed by your healthcare provider.   . Ask your healthcare provider when you can go back to work   INCISION/Dressing . If you are on a blood thinner such as Coumadin, Xarelto, Eliquis, Plavix, or Pradaxa please confirm with your provider when this should be resumed.   . Monitor your Pacemaker site for redness, swelling, and drainage. Call the device clinic at [phone] if you experience these symptoms or fever/chills.  . If your incision is sealed with Steri-strips or staples, you may shower 10 days after your procedure or when told by your provider. Do not remove the steri-strips or let the shower hit directly on your site. You may wash around your site with soap and water.    Marland Kitchen Avoid lotions, ointments, or perfumes over your incision until it is well-healed.  . You may use a hot tub or a pool AFTER your wound check appointment if the incision is completely closed.  Marland Kitchen PAcemaker Alerts:  Some alerts are vibratory and others beep. These are NOT emergencies. Please call our office to let us know. If this occurs at night or on weekends, it can wait until the next business day. Send a remote transmission.  . If your device is capable of reading fluid status (for heart failure), you will be offered monthly monitoring to review this with you.   DEVICE MANAGEMENT . Remote monitoring is used to monitor your pacemaker from home. This monitoring is scheduled every 91 days by our office. It  allows [place] to keep an eye on the functioning of your device to ensure it is working properly. You will routinely see your Electrophysiologist annually (more often if necessary).   . You should receive your ID card for your new device in 4-8 weeks. Keep this card with you at all times once received. Consider wearing a medical alert bracelet or necklace.  . Your Pacemaker may be MRI compatible. This will be discussed at your next office visit/wound check.  You should avoid contact with strong electric or magnetic fields.    Do not use amateur (ham) radio equipment or electric (arc) welding torches. MP3 player headphones with magnets should not be used. Some devices are safe to use if held at least 12 inches (30 cm) from your Pacemaker. These include power tools, lawn mowers, and speakers. If you are unsure if something is safe to use, ask your health care provider.   When using your cell phone, hold it to the ear that is on the opposite side from the Pacemaker. Do not leave your cell phone in a pocket over the Pacemaker.   You may safely use electric blankets, heating pads, computers, and microwave ovens.  Call the office right away if:  You have chest pain.  You feel more short of breath than you have felt before.  You feel more light-headed than you have felt before.  Your  incision starts to open up.  This information is not intended to replace advice given to you by your health care provider. Make sure you discuss any questions you have with your health care provider.

## 2019-08-16 NOTE — Progress Notes (Signed)
  Echocardiogram 2D Echocardiogram has been performed.  Jillian Leblanc 08/16/2019, 9:27 AM

## 2019-08-16 NOTE — Consult Note (Addendum)
° °ELECTROPHYSIOLOGY CONSULT NOTE  ° ° °Patient ID: Jillian Leblanc °MRN: [id number], DOB/AGE: 09/28/1945 74 y.o. ° °Admit date: 08/15/2019 °Date of Consult: 08/16/2019 ° °Primary Physician: Arvind, Moogali M, MD °Primary Cardiologist: No primary care provider on file.  °Electrophysiologist:  New to Dr. Genese Quebedeaux   ° °Referring Provider: Dr. Branch ° °Patient Profile: °Jillian Leblanc is a 74 y.o. female with a history of DM2 and Thyroid disease who is being seen today for the evaluation of sinus pauses at the request of Dr. Branch. ° °HPI:  °[name] is a 74 y.o. female with medical history as above.  ° °Pt has been followed as an outpatient for frequent falls over the past several months. She has undergone extensive evaluation by her PMD, neurology, and cardiology. She reportedly underwent a stress test at [facility] was given a holter monitor and sent home. She was alerted by the monitoring company of abnormal rhythm, and reported to ER.  ° °Upon arrival she was afebrile. EKG showed SR with incomplete LBBB at 112 ms . HS troponin mildly elevated 67->69. CT head without acute intracranial injury and CXR WNL. COVID negative.  ° °In describing her falls: "she states that she doesn't think she loses conciousness, but that her legs "give out" suddenly. She doesn't have antecedent lightheadedness or dizziness, but it happens so quickly that she is unable to catch herself (which has resulted in recent facial trauma). She has had 27 total falls and is constantly fearful of another."  ° °She is currently feeling OK in bed. She denies any frank loss of consciousness. Denies SOB. She confirms she is not on any AV nodal blocking agents. ° ° °Past Medical History:  °Diagnosis Date  ° Diabetes mellitus without complication (HCC)   ° Interstitial cystitis   ° Thyroid disease   °  ° °Surgical History:  °Past Surgical History:  °Procedure Laterality Date  ° ABDOMINAL HYSTERECTOMY    ° CHOLECYSTECTOMY    ° NECK SURGERY     ° SHOULDER SURGERY    ° THYROIDECTOMY    °  ° °Medications Prior to Admission  °Medication Sig Dispense Refill Last Dose  ° amitriptyline (ELAVIL) 50 MG tablet Take 50 mg by mouth at bedtime.     ° ASPIRIN LOW DOSE 81 MG EC tablet Take 81 mg by mouth daily.     ° calcium carbonate (TUMS EX) 750 MG chewable tablet Chew by mouth.     ° citalopram (CELEXA) 20 MG tablet Take 20 mg by mouth at bedtime.     ° diclofenac Sodium (VOLTAREN) 1 % GEL Apply topically as needed.     ° fluticasone (FLONASE) 50 MCG/ACT nasal spray as needed.     ° hydrOXYzine (ATARAX/VISTARIL) 25 MG tablet Take 1-2 tablets by mouth every evening.     ° ibuprofen (ADVIL) 600 MG tablet Take 600 mg by mouth 3 (three) times daily.     ° insulin glargine, 1 Unit Dial, (TOUJEO) 300 UNIT/ML Solostar Pen Inject 15 Units into the skin daily.     ° levothyroxine (SYNTHROID) 100 MCG tablet Take 100 mcg by mouth daily.     ° metFORMIN (GLUCOPHAGE) 500 MG tablet Take 1,000 mg by mouth in the morning and at bedtime.     ° omeprazole (PRILOSEC) 40 MG capsule Take 40 mg by mouth as needed.     ° ondansetron (ZOFRAN) 4 MG tablet Take 4 mg by mouth every 8 (eight) hours as needed for nausea.     °   oxybutynin (DITROPAN-XL) 10 MG 24 hr tablet Take 10 mg by mouth daily.     ° oxyCODONE-acetaminophen (PERCOCET) 10-325 MG per tablet Take 1 tablet by mouth every 4 (four) hours as needed for pain.     ° promethazine (PHENERGAN) 25 MG tablet Take 25 mg by mouth as needed.     ° rOPINIRole (REQUIP) 3 MG tablet Take 3 mg by mouth at bedtime.     ° ° °Inpatient Medications:  ° amitriptyline  50 mg Oral QHS  ° aspirin EC  81 mg Oral Daily  ° citalopram  20 mg Oral QHS  ° hydrOXYzine  25 mg Oral QPM  ° levothyroxine  100 mcg Oral Q0600  ° oxybutynin  10 mg Oral Daily  ° [START ON 08/17/2019] rOPINIRole  3 mg Oral QHS  ° sodium chloride flush  3 mL Intravenous Once  ° ° °Allergies:  °Allergies  °Allergen Reactions  ° Pentosan Polysulfate Sodium   °  Other reaction(s): Other  (See Comments) °ELMIRON Y Drug hair fell out 10/21/2009 12:00:00 AM °by Laquana Glenn CNA 1  ° Elmiron [Pentosan Polysulfate]   ° Naloxone Other (See Comments)  °  Hallucinations °Confusion °Nightmares  ° ° °Social History  ° °Socioeconomic History  ° Marital status: Married  °  Spouse name: Not on file  ° Number of children: Not on file  ° Years of education: Not on file  ° Highest education level: Not on file  °Occupational History  ° Not on file  °Tobacco Use  ° Smoking status: Never Smoker  ° Smokeless tobacco: Never Used  °Substance and Sexual Activity  ° Alcohol use: No  ° Drug use: Never  ° Sexual activity: Not on file  °Other Topics Concern  ° Not on file  °Social History Narrative  ° Not on file  ° °Social Determinants of Health  ° °Financial Resource Strain:   ° Difficulty of Paying Living Expenses:   °Food Insecurity:   ° Worried About Running Out of Food in the Last Year:   ° Ran Out of Food in the Last Year:   °Transportation Needs:   ° Lack of Transportation (Medical):   ° Lack of Transportation (Non-Medical):   °Physical Activity:   ° Days of Exercise per Week:   ° Minutes of Exercise per Session:   °Stress:   ° Feeling of Stress :   °Social Connections:   ° Frequency of Communication with Friends and Family:   ° Frequency of Social Gatherings with Friends and Family:   ° Attends Religious Services:   ° Active Member of Clubs or Organizations:   ° Attends Club or Organization Meetings:   ° Marital Status:   °Intimate Partner Violence:   ° Fear of Current or Ex-Partner:   ° Emotionally Abused:   ° Physically Abused:   ° Sexually Abused:   °  ° °No family history on file.  ° °Review of Systems: °All other systems reviewed and are otherwise negative except as noted above. ° °Physical Exam: °Vitals:  ° 08/16/19 0117 08/16/19 0207 08/16/19 0232 08/16/19 0728  °BP: (!) 163/64 (!) 182/85 (!) 164/64 (!) 157/71  °Pulse: 65   63  °Resp: 20 18  18  °Temp:  98.2 °F (36.8 °C)  98.4 °F (36.9 °C)  °TempSrc:  Oral   Oral  °SpO2: 98%   96%  °Weight:  55 kg    °Height:      ° ° °GEN- The patient is fatigued appearing,   alert and oriented x 3 today.   °HEENT: normocephalic, atraumatic; sclera clear, conjunctiva pink; hearing intact; oropharynx clear; neck supple °Lungs- Clear to ausculation bilaterally, normal work of breathing.  No wheezes, rales, rhonchi °Heart- Regular rate and rhythm, no murmurs, rubs or gallops °GI- soft, non-tender, non-distended, bowel sounds present °Extremities- no clubbing, cyanosis, or edema; DP/PT/radial pulses 2+ bilaterally °MS- no significant deformity or atrophy °Skin- warm and dry, no rash. Multiple areas of ecchymosis.  °Psych- euthymic mood, full affect °Neuro- strength and sensation are intact ° °Labs: °  °Lab Results  °Component Value Date  ° WBC 5.6 08/15/2019  ° HGB 10.4 (L) 08/15/2019  ° HCT 33.0 (L) 08/15/2019  ° MCV 84.4 08/15/2019  ° PLT 285 08/15/2019  °  °Recent Labs  °Lab 08/16/19 °0448  °NA 136  °K 3.8  °CL 102  °CO2 25  °BUN 11  °CREATININE 1.13*  °CALCIUM 7.0*  °PROT 6.5  °BILITOT 0.3  °ALKPHOS 52  °ALT 13  °AST 18  °GLUCOSE 143*  ° ° °  °Radiology/Studies: DG Chest 2 View ° °Result Date: 08/15/2019 °CLINICAL DATA:  Chest pain. EXAM: CHEST - 2 VIEW COMPARISON:  Chest x-ray dated Jun 22, 2019. FINDINGS: The heart size and mediastinal contours are within normal limits. Atherosclerotic calcification of the aortic arch. Normal pulmonary vascularity. Linear atelectasis in the right upper lobe and lingula. No focal consolidation, pleural effusion, or pneumothorax. No acute osseous abnormality. Chronic T12 compression deformity. IMPRESSION: 1. No acute cardiopulmonary disease. Electronically Signed   By: William T Derry M.D.   On: 08/15/2019 17:55  ° °CT Head Wo Contrast ° °Result Date: 08/15/2019 °CLINICAL DATA:  Facial trauma recent falls EXAM: CT HEAD WITHOUT CONTRAST TECHNIQUE: Contiguous axial images were obtained from the base of the skull through the vertex without intravenous  contrast. COMPARISON:  CT brain 08/13/2019 FINDINGS: Brain: No acute territorial infarction, hemorrhage or intracranial mass. Nonenlarged ventricles. Vascular: No hyperdense vessel or unexpected calcification. Skull: Normal. Negative for fracture or focal lesion. Sinuses/Orbits: No acute finding. Other: Decreased left periorbital hematoma IMPRESSION: 1. No CT evidence for acute intracranial abnormality. 2. Decreased left periorbital hematoma. Electronically Signed   By: Kim  Fujinaga M.D.   On: 08/15/2019 19:25  ° °CT HEAD WO CONTRAST ° °Result Date: 08/13/2019 °CLINICAL DATA:  Fall, initial encounter. Neck pain. Laceration/bruising over left eye, headache. EXAM: CT HEAD WITHOUT CONTRAST CT CERVICAL SPINE WITHOUT CONTRAST TECHNIQUE: Multidetector CT imaging of the head and cervical spine was performed following the standard protocol without intravenous contrast. Multiplanar CT image reconstructions of the cervical spine were also generated. COMPARISON:  Head CT 06/22/2019, CT cervical spine 06/21/2020 FINDINGS: CT HEAD FINDINGS Brain: Cerebral volume is normal for age. There is no acute intracranial hemorrhage. No demarcated cortical infarct. No extra-axial fluid collection. No evidence of intracranial mass. No midline shift. Vascular: No hyperdense vessel. Skull: No calvarial fracture. Small nonspecific lucent foci within the occipital calvarium which are nonspecific, but may reflect prominent arachnoid granulations Sinuses/Orbits: Prominent left periorbital hematoma/laceration no significant paranasal sinus disease or mastoid effusion at the imaged levels CT CERVICAL SPINE FINDINGS Alignment: Reversal of the expected cervical lordosis. Trace C3-C4 and C4-C5 grade 1 anterolisthesis. Skull base and vertebrae: The basion-dental and atlanto-dental intervals are maintained.No evidence of acute fracture to the cervical spine. Soft tissues and spinal canal: No prevertebral fluid or swelling. No visible canal hematoma.  Disc levels: Cervical spondylosis. Most notably at C6-C7, there is moderate/advanced disc space narrowing with a posterior disc osteophyte complex   and uncovertebral hypertrophy. Upper chest: No consolidation within the imaged lung apices. No visible pneumothorax. Other: Calcified atherosclerotic plaque within the visualized aortic arch, proximal major branch vessels of the neck and carotid arteries. IMPRESSION: CT head: 1. No evidence of acute intracranial abnormality. 2. Prominent left periorbital hematoma/laceration. CT cervical spine: 1. No evidence of acute fracture to the cervical spine. 2. Nonspecific reversal of the expected cervical lordosis. 3. Trace C3-C4 and C4-C5 grade 1 anterolisthesis. 4. Cervical spondylosis as described and greatest at C6-C7. Electronically Signed   By: Kyle  Golden DO   On: 08/13/2019 11:30  ° °CT CERVICAL SPINE WO CONTRAST ° °Result Date: 08/13/2019 °CLINICAL DATA:  Fall, initial encounter. Neck pain. Laceration/bruising over left eye, headache. EXAM: CT HEAD WITHOUT CONTRAST CT CERVICAL SPINE WITHOUT CONTRAST TECHNIQUE: Multidetector CT imaging of the head and cervical spine was performed following the standard protocol without intravenous contrast. Multiplanar CT image reconstructions of the cervical spine were also generated. COMPARISON:  Head CT 06/22/2019, CT cervical spine 06/21/2020 FINDINGS: CT HEAD FINDINGS Brain: Cerebral volume is normal for age. There is no acute intracranial hemorrhage. No demarcated cortical infarct. No extra-axial fluid collection. No evidence of intracranial mass. No midline shift. Vascular: No hyperdense vessel. Skull: No calvarial fracture. Small nonspecific lucent foci within the occipital calvarium which are nonspecific, but may reflect prominent arachnoid granulations Sinuses/Orbits: Prominent left periorbital hematoma/laceration no significant paranasal sinus disease or mastoid effusion at the imaged levels CT CERVICAL SPINE FINDINGS Alignment:  Reversal of the expected cervical lordosis. Trace C3-C4 and C4-C5 grade 1 anterolisthesis. Skull base and vertebrae: The basion-dental and atlanto-dental intervals are maintained.No evidence of acute fracture to the cervical spine. Soft tissues and spinal canal: No prevertebral fluid or swelling. No visible canal hematoma. Disc levels: Cervical spondylosis. Most notably at C6-C7, there is moderate/advanced disc space narrowing with a posterior disc osteophyte complex and uncovertebral hypertrophy. Upper chest: No consolidation within the imaged lung apices. No visible pneumothorax. Other: Calcified atherosclerotic plaque within the visualized aortic arch, proximal major branch vessels of the neck and carotid arteries. IMPRESSION: CT head: 1. No evidence of acute intracranial abnormality. 2. Prominent left periorbital hematoma/laceration. CT cervical spine: 1. No evidence of acute fracture to the cervical spine. 2. Nonspecific reversal of the expected cervical lordosis. 3. Trace C3-C4 and C4-C5 grade 1 anterolisthesis. 4. Cervical spondylosis as described and greatest at C6-C7. Electronically Signed   By: Kyle  Golden DO   On: 08/13/2019 11:30  ° °DG Knee Complete 4 Views Left ° °Result Date: 08/13/2019 °CLINICAL DATA:  Left knee pain EXAM: LEFT KNEE - COMPLETE 4+ VIEW COMPARISON:  November 18, 2017 FINDINGS: No acute fracture or dislocation. Tricompartmental degenerative changes most pronounced in the medial compartment with moderate joint space narrowing and osteophyte formation. Enthesiophyte at the quadriceps tendon insertion on the patella. No area of erosion or osseous destruction. No unexpected radiopaque foreign body. Vascular calcifications. IMPRESSION: No acute fracture or dislocation. Electronically Signed   By: Stephanie  Peacock MD   On: 08/13/2019 11:28  ° °NCV with EMG(electromyography) ° °Result Date: 07/25/2019 °Penumalli, Vikram R, MD     07/25/2019  3:58 PM  [facility] NCS (NERVE  CONDUCTION STUDY) WITH EMG (ELECTROMYOGRAPHY) REPORT STUDY DATE: 07/25/19 PATIENT NAME: Alayssa Kniss DOB: 09/20/1945 MRN: [id number] ORDERING CLINICIAN: Carmen Dohmeier, MD TECHNOLOGIST: N Gann ELECTROMYOGRAPHER: Vikram R. Penumalli, MD CLINICAL INFORMATION: 74 year old female with frequent falls and low back pain. FINDINGS: NERVE CONDUCTION STUDY: Bilateral peroneal and right tibial motor responses   are normal. Left tibial motor response has normal distal latency, decreased amplitude and slow conduction velocity. Bilateral sural sensory responses are normal. Bilateral superficial peroneal sensory responses have decreased amplitudes. Bilateral tibial F wave latencies are normal. NEEDLE ELECTROMYOGRAPHY: Needle examination of right lower extremity vastus medialis, tibialis anterior, gastrocnemius and right lumbar paraspinal muscles notable for positive sharp waves and fibrillation potentials in the right tibialis anterior.  Normal motor unit recruitment on exertion. 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 INTERPRETING PHYSICIAN: VIKRAM R. PENUMALLI, MD Certified in Neurology, Neurophysiology and Neuroimaging [facility] [address], [address] [place], [place] [address] [phone] M[facility]   Nerve / Sites Muscle Latency Ref. Amplitude Ref. Rel Amp Segments Distance Velocity Ref. Area   ms ms mV mV %  cm m/s m/s mVms R Peroneal - EDB    Ankle EDB 4.2 ?6.5 3.7 ?2.0 100 Ankle - EDB 9   15.7    Fib head EDB 10.6  3.5  95.2 Fib head - Ankle 28 44 ?44 15.2    Pop fossa EDB 12.9  1.9  53.3 Pop fossa - Fib head 10 44 ?44 10.7        Pop fossa - Ankle     L Peroneal - EDB    Ankle EDB 4.0 ?6.5 3.7 ?2.0 100 Ankle - EDB 9   16.3    Fib head EDB 10.3  3.4  93.5 Fib head - Ankle 28 45 ?44 15.7    Pop fossa EDB 12.6  1.3  38.3 Pop fossa - Fib head 10 44 ?44 7.9        Pop fossa - Ankle     R Tibial - AH    Ankle AH 3.0 ?5.8 4.9 ?4.0 100 Ankle - AH 9   10.2    Pop fossa AH  10.6  1.4  29.4 Pop fossa - Ankle 31 41 ?41 3.8 L Tibial - AH    Ankle AH 3.1 ?5.8 3.2 ?4.0 100 Ankle - AH 9   7.9    Pop fossa AH 12.6  1.1  35 Pop fossa - Ankle 32 34 ?41 1.8           SNC   Nerve / Sites Rec. Site Peak Lat Ref.  Amp Ref. Segments Distance   ms ms µV µV  cm R Sural - Ankle (Calf)    Calf Ankle 3.9 ?4.4 6 ?6 Calf - Ankle 14 L Sural - Ankle (Calf)    Calf Ankle 3.8 ?4.4 8 ?6 Calf - Ankle 14 R Superficial peroneal - Ankle    Lat leg Ankle 3.8 ?4.4 2 ?6 Lat leg - Ankle 14 L Superficial peroneal - Ankle    Lat leg Ankle 3.8 ?4.4 4 ?6 Lat leg - Ankle 14           F  Wave   Nerve F Lat Ref.  ms ms R Tibial - AH 48.6 ?56.0 L Tibial - AH 55.0 ?56.0       EMG Summary Table   Spontaneous MUAP Recruitment Muscle IA Fib PSW Fasc Other Amp Dur. Poly Pattern R. Vastus medialis Normal None None None _______ Normal Normal Normal Normal R. Tibialis anterior Normal 1+ 1+ None _______ Normal Normal Normal Normal R. Gastrocnemius (Medial head) Normal None None None _______ Normal Normal Normal Normal R. Lumbar paraspinals Normal None None None _______ Normal Normal Normal Normal   ° ° °EKG: NSR with incomplete LBBB at 112 ms (personally   reviewed) ° °TELEMETRY: NSR/Sinus brady 50-60s  (personally reviewed) ° °[employer] monitor strips reviewed personally by Dr. Brissa Asante which showed significant sinus pauses ° °Assessment/Plan: °1.  Recurrent falls °2. Sinus Pause °With recurrent falls and pauses on monitoring of up to 3.6 seconds. That specific pause did not correspond with a fall per family member.  °No AV nodal blocking agents. °She is on amitriptyline, which has been associated with QT prolongation and heart block.   °TSH as below °Echo has been done, pending.  °She is NPO.  °Explained risks, benefits, and alternatives to PPM implantation, including but not limited to bleeding, infection, pneumothorax, pericardial effusion, lead dislodgement, heart attack, stroke, or death.  Pt verbalized understanding and agrees to  proceed if indicated and no reversible cause is seen on remaining work up.  °  °3. Hypothyroidism °-- TSH 11, Free T4 normal.  °  °4. Depression  °Continue home medications °  °5. DM2 °Metformin held.  °Can add ISS coverage as needed once no longer NPO.  ° °6. Bladder retention °She reports having a bladder stimulation device, Stefano Trulson assess if this would be likely to interfere with pacemaker function.  ° °For questions or updates, please contact [facility] °Please consult [email or web address] for contact info under Cardiology/STEMI. ° °Signed, °Michael Andrew Tillery, PA-C  °08/16/2019 °8:38 AM ° ° °I have seen and examined this patient with Andy Tillery.  Agree with above, note added to reflect my findings.  On exam, RRR, no murmurs, lungs clear. Patent admitted with falls and pauses found on monitor. Due to that, Skylah Delauter plan for pacemaker implant. Risks and benefits discussed. Risks include bleeding, tamponade, infection, pneumothorax, lead dislodgement. The patient understands the risks and has agreed to the procedure.   ° °Axil Copeman M. Jacub Waiters MD °08/16/2019 °9:59 AM ° ° ° ° °

## 2019-08-16 NOTE — H&P (View-Only) (Signed)
ELECTROPHYSIOLOGY CONSULT NOTE    Patient ID: Jillian Leblanc MRN: [id number], DOB/AGE: 01/28/1945 74 y.o.  Admit date: 08/15/2019 Date of Consult: 08/16/2019  Primary Physician: Karle Plumber, MD Primary Cardiologist: No primary care provider on file.  Electrophysiologist:  New to Dr. Elberta Fortis    Referring Provider: Dr. Wyline Mood  Patient Profile: Jillian Leblanc is a 74 y.o. female with a history of DM2 and Thyroid disease who is being seen today for the evaluation of sinus pauses at the request of Dr. Wyline Mood.  HPI:  Jillian Leblanc is a 74 y.o. female with medical history as above.   Pt has been followed as an outpatient for frequent falls over the past several months. She has undergone extensive evaluation by her PMD, neurology, and cardiology. She reportedly underwent a stress test at [facility] center was given a holter monitor and sent home. She was alerted by the monitoring company of abnormal rhythm, and reported to ER.   Upon arrival she was afebrile. EKG showed SR with incomplete LBBB at 112 ms . HS troponin mildly elevated 67->69. CT head without acute intracranial injury and CXR WNL. COVID negative.   In describing her falls: "she states that she doesn't think she loses conciousness, but that her legs "give out" suddenly. She doesn't have antecedent lightheadedness or dizziness, but it happens so quickly that she is unable to catch herself (which has resulted in recent facial trauma). She has had 27 total falls and is constantly fearful of another."   She is currently feeling OK in bed. She denies any frank loss of consciousness. Denies SOB. She confirms she is not on any AV nodal blocking agents.   Past Medical History:  Diagnosis Date   Diabetes mellitus without complication (HCC)    Interstitial cystitis    Thyroid disease      Surgical History:  Past Surgical History:  Procedure Laterality Date   ABDOMINAL HYSTERECTOMY     CHOLECYSTECTOMY     NECK SURGERY      SHOULDER SURGERY     THYROIDECTOMY       Medications Prior to Admission  Medication Sig Dispense Refill Last Dose   amitriptyline (ELAVIL) 50 MG tablet Take 50 mg by mouth at bedtime.      ASPIRIN LOW DOSE 81 MG EC tablet Take 81 mg by mouth daily.      calcium carbonate (TUMS EX) 750 MG chewable tablet Chew by mouth.      citalopram (CELEXA) 20 MG tablet Take 20 mg by mouth at bedtime.      diclofenac Sodium (VOLTAREN) 1 % GEL Apply topically as needed.      fluticasone (FLONASE) 50 MCG/ACT nasal spray as needed.      hydrOXYzine (ATARAX/VISTARIL) 25 MG tablet Take 1-2 tablets by mouth every evening.      ibuprofen (ADVIL) 600 MG tablet Take 600 mg by mouth 3 (three) times daily.      insulin glargine, 1 Unit Dial, (TOUJEO) 300 UNIT/ML Solostar Pen Inject 15 Units into the skin daily.      levothyroxine (SYNTHROID) 100 MCG tablet Take 100 mcg by mouth daily.      metFORMIN (GLUCOPHAGE) 500 MG tablet Take 1,000 mg by mouth in the morning and at bedtime.      omeprazole (PRILOSEC) 40 MG capsule Take 40 mg by mouth as needed.      ondansetron (ZOFRAN) 4 MG tablet Take 4 mg by mouth every 8 (eight) hours as needed for nausea.  oxybutynin (DITROPAN-XL) 10 MG 24 hr tablet Take 10 mg by mouth daily.      oxyCODONE-acetaminophen (PERCOCET) 10-325 MG per tablet Take 1 tablet by mouth every 4 (four) hours as needed for pain.      promethazine (PHENERGAN) 25 MG tablet Take 25 mg by mouth as needed.      rOPINIRole (REQUIP) 3 MG tablet Take 3 mg by mouth at bedtime.       Inpatient Medications:   amitriptyline  50 mg Oral QHS   aspirin EC  81 mg Oral Daily   citalopram  20 mg Oral QHS   hydrOXYzine  25 mg Oral QPM   levothyroxine  100 mcg Oral Q0600   oxybutynin  10 mg Oral Daily   [START ON 08/17/2019] rOPINIRole  3 mg Oral QHS   sodium chloride flush  3 mL Intravenous Once    Allergies:  Allergies  Allergen Reactions   Pentosan Polysulfate Sodium     Other reaction(s): Other  (See Comments) ELMIRON Y Drug hair fell out 10/21/2009 12:00:00 AM by Johny BlamerLaquana Glenn CNA 1   Elmiron [Pentosan Polysulfate]    Naloxone Other (See Comments)    Hallucinations Confusion Nightmares    Social History   Socioeconomic History   Marital status: Married    Spouse name: Not on file   Number of children: Not on file   Years of education: Not on file   Highest education level: Not on file  Occupational History   Not on file  Tobacco Use   Smoking status: Never Smoker   Smokeless tobacco: Never Used  Substance and Sexual Activity   Alcohol use: No   Drug use: Never   Sexual activity: Not on file  Other Topics Concern   Not on file  Social History Narrative   Not on file   Social Determinants of Health   Financial Resource Strain:    Difficulty of Paying Living Expenses:   Food Insecurity:    Worried About Radiation protection practitionerunning Out of Food in the Last Year:    Baristaan Out of Food in the Last Year:   Transportation Needs:    Freight forwarderLack of Transportation (Medical):    Lack of Transportation (Non-Medical):   Physical Activity:    Days of Exercise per Week:    Minutes of Exercise per Session:   Stress:    Feeling of Stress :   Social Connections:    Frequency of Communication with Friends and Family:    Frequency of Social Gatherings with Friends and Family:    Attends Religious Services:    Active Member of Clubs or Organizations:    Attends Engineer, structuralClub or Organization Meetings:    Marital Status:   Intimate Partner Violence:    Fear of Current or Ex-Partner:    Emotionally Abused:    Physically Abused:    Sexually Abused:      No family history on file.   Review of Systems: All other systems reviewed and are otherwise negative except as noted above.  Physical Exam: Vitals:   08/16/19 0117 08/16/19 0207 08/16/19 0232 08/16/19 0728  BP: (!) 163/64 (!) 182/85 (!) 164/64 (!) 157/71  Pulse: 65   63  Resp: 20 18  18   Temp:  98.2 F (36.8 C)  98.4 F (36.9 C)  TempSrc:  Oral   Oral  SpO2: 98%   96%  Weight:  55 kg    Height:        GEN- The patient is fatigued appearing,  alert and oriented x 3 today.   HEENT: normocephalic, atraumatic; sclera clear, conjunctiva pink; hearing intact; oropharynx clear; neck supple Lungs- Clear to ausculation bilaterally, normal work of breathing.  No wheezes, rales, rhonchi Heart- Regular rate and rhythm, no murmurs, rubs or gallops GI- soft, non-tender, non-distended, bowel sounds present Extremities- no clubbing, cyanosis, or edema; DP/PT/radial pulses 2+ bilaterally MS- no significant deformity or atrophy Skin- warm and dry, no rash. Multiple areas of ecchymosis.  Psych- euthymic mood, full affect Neuro- strength and sensation are intact  Labs:   Lab Results  Component Value Date   WBC 5.6 08/15/2019   HGB 10.4 (L) 08/15/2019   HCT 33.0 (L) 08/15/2019   MCV 84.4 08/15/2019   PLT 285 08/15/2019    Recent Labs  Lab 08/16/19 0448  NA 136  K 3.8  CL 102  CO2 25  BUN 11  CREATININE 1.13*  CALCIUM 7.0*  PROT 6.5  BILITOT 0.3  ALKPHOS 52  ALT 13  AST 18  GLUCOSE 143*      Radiology/Studies: DG Chest 2 View  Result Date: 08/15/2019 CLINICAL DATA:  Chest pain. EXAM: CHEST - 2 VIEW COMPARISON:  Chest x-ray dated Jun 22, 2019. FINDINGS: The heart size and mediastinal contours are within normal limits. Atherosclerotic calcification of the aortic arch. Normal pulmonary vascularity. Linear atelectasis in the right upper lobe and lingula. No focal consolidation, pleural effusion, or pneumothorax. No acute osseous abnormality. Chronic T12 compression deformity. IMPRESSION: 1. No acute cardiopulmonary disease. Electronically Signed   By: Obie Dredge M.D.   On: 08/15/2019 17:55   CT Head Wo Contrast  Result Date: 08/15/2019 CLINICAL DATA:  Facial trauma recent falls EXAM: CT HEAD WITHOUT CONTRAST TECHNIQUE: Contiguous axial images were obtained from the base of the skull through the vertex without intravenous  contrast. COMPARISON:  CT brain 08/13/2019 FINDINGS: Brain: No acute territorial infarction, hemorrhage or intracranial mass. Nonenlarged ventricles. Vascular: No hyperdense vessel or unexpected calcification. Skull: Normal. Negative for fracture or focal lesion. Sinuses/Orbits: No acute finding. Other: Decreased left periorbital hematoma IMPRESSION: 1. No CT evidence for acute intracranial abnormality. 2. Decreased left periorbital hematoma. Electronically Signed   By: Jasmine Pang M.D.   On: 08/15/2019 19:25   CT HEAD WO CONTRAST  Result Date: 08/13/2019 CLINICAL DATA:  Fall, initial encounter. Neck pain. Laceration/bruising over left eye, headache. EXAM: CT HEAD WITHOUT CONTRAST CT CERVICAL SPINE WITHOUT CONTRAST TECHNIQUE: Multidetector CT imaging of the head and cervical spine was performed following the standard protocol without intravenous contrast. Multiplanar CT image reconstructions of the cervical spine were also generated. COMPARISON:  Head CT 06/22/2019, CT cervical spine 06/21/2020 FINDINGS: CT HEAD FINDINGS Brain: Cerebral volume is normal for age. There is no acute intracranial hemorrhage. No demarcated cortical infarct. No extra-axial fluid collection. No evidence of intracranial mass. No midline shift. Vascular: No hyperdense vessel. Skull: No calvarial fracture. Small nonspecific lucent foci within the occipital calvarium which are nonspecific, but may reflect prominent arachnoid granulations Sinuses/Orbits: Prominent left periorbital hematoma/laceration no significant paranasal sinus disease or mastoid effusion at the imaged levels CT CERVICAL SPINE FINDINGS Alignment: Reversal of the expected cervical lordosis. Trace C3-C4 and C4-C5 grade 1 anterolisthesis. Skull base and vertebrae: The basion-dental and atlanto-dental intervals are maintained.No evidence of acute fracture to the cervical spine. Soft tissues and spinal canal: No prevertebral fluid or swelling. No visible canal hematoma.  Disc levels: Cervical spondylosis. Most notably at C6-C7, there is moderate/advanced disc space narrowing with a posterior disc osteophyte complex  and uncovertebral hypertrophy. Upper chest: No consolidation within the imaged lung apices. No visible pneumothorax. Other: Calcified atherosclerotic plaque within the visualized aortic arch, proximal major branch vessels of the neck and carotid arteries. IMPRESSION: CT head: 1. No evidence of acute intracranial abnormality. 2. Prominent left periorbital hematoma/laceration. CT cervical spine: 1. No evidence of acute fracture to the cervical spine. 2. Nonspecific reversal of the expected cervical lordosis. 3. Trace C3-C4 and C4-C5 grade 1 anterolisthesis. 4. Cervical spondylosis as described and greatest at C6-C7. Electronically Signed   By: Jackey Loge DO   On: 08/13/2019 11:30   CT CERVICAL SPINE WO CONTRAST  Result Date: 08/13/2019 CLINICAL DATA:  Fall, initial encounter. Neck pain. Laceration/bruising over left eye, headache. EXAM: CT HEAD WITHOUT CONTRAST CT CERVICAL SPINE WITHOUT CONTRAST TECHNIQUE: Multidetector CT imaging of the head and cervical spine was performed following the standard protocol without intravenous contrast. Multiplanar CT image reconstructions of the cervical spine were also generated. COMPARISON:  Head CT 06/22/2019, CT cervical spine 06/21/2020 FINDINGS: CT HEAD FINDINGS Brain: Cerebral volume is normal for age. There is no acute intracranial hemorrhage. No demarcated cortical infarct. No extra-axial fluid collection. No evidence of intracranial mass. No midline shift. Vascular: No hyperdense vessel. Skull: No calvarial fracture. Small nonspecific lucent foci within the occipital calvarium which are nonspecific, but may reflect prominent arachnoid granulations Sinuses/Orbits: Prominent left periorbital hematoma/laceration no significant paranasal sinus disease or mastoid effusion at the imaged levels CT CERVICAL SPINE FINDINGS Alignment:  Reversal of the expected cervical lordosis. Trace C3-C4 and C4-C5 grade 1 anterolisthesis. Skull base and vertebrae: The basion-dental and atlanto-dental intervals are maintained.No evidence of acute fracture to the cervical spine. Soft tissues and spinal canal: No prevertebral fluid or swelling. No visible canal hematoma. Disc levels: Cervical spondylosis. Most notably at C6-C7, there is moderate/advanced disc space narrowing with a posterior disc osteophyte complex and uncovertebral hypertrophy. Upper chest: No consolidation within the imaged lung apices. No visible pneumothorax. Other: Calcified atherosclerotic plaque within the visualized aortic arch, proximal major branch vessels of the neck and carotid arteries. IMPRESSION: CT head: 1. No evidence of acute intracranial abnormality. 2. Prominent left periorbital hematoma/laceration. CT cervical spine: 1. No evidence of acute fracture to the cervical spine. 2. Nonspecific reversal of the expected cervical lordosis. 3. Trace C3-C4 and C4-C5 grade 1 anterolisthesis. 4. Cervical spondylosis as described and greatest at C6-C7. Electronically Signed   By: Jackey Loge DO   On: 08/13/2019 11:30   DG Knee Complete 4 Views Left  Result Date: 08/13/2019 CLINICAL DATA:  Left knee pain EXAM: LEFT KNEE - COMPLETE 4+ VIEW COMPARISON:  November 18, 2017 FINDINGS: No acute fracture or dislocation. Tricompartmental degenerative changes most pronounced in the medial compartment with moderate joint space narrowing and osteophyte formation. Enthesiophyte at the quadriceps tendon insertion on the patella. No area of erosion or osseous destruction. No unexpected radiopaque foreign body. Vascular calcifications. IMPRESSION: No acute fracture or dislocation. Electronically Signed   By: Meda Klinefelter MD   On: 08/13/2019 11:28   NCV with EMG(electromyography)  Result Date: 07/25/2019 Suanne Marker, MD     07/25/2019  3:58 PM  [facility] NCS (NERVE  CONDUCTION STUDY) WITH EMG (ELECTROMYOGRAPHY) REPORT STUDY DATE: 07/25/19 PATIENT NAME: Yemaya Barnier DOB: 1945/03/16 MRN: [id number] ORDERING CLINICIAN: Melvyn Novas, MD TECHNOLOGIST: Durenda Age ELECTROMYOGRAPHER: Glenford Bayley. Penumalli, MD CLINICAL INFORMATION: 74 year old female with frequent falls and low back pain. FINDINGS: NERVE CONDUCTION STUDY: Bilateral peroneal and right tibial motor responses  are normal. Left tibial motor response has normal distal latency, decreased amplitude and slow conduction velocity. Bilateral sural sensory responses are normal. Bilateral superficial peroneal sensory responses have decreased amplitudes. Bilateral tibial F wave latencies are normal. NEEDLE ELECTROMYOGRAPHY: Needle examination of right lower extremity vastus medialis, tibialis anterior, gastrocnemius and right lumbar paraspinal muscles notable for positive sharp waves and fibrillation potentials in the right tibialis anterior.  Normal motor unit recruitment on exertion. 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 INTERPRETING PHYSICIAN: Suanne Marker, MD Certified in Neurology, Neurophysiology and Neuroimaging [facility] [address], [address] [place], [place] [address] [phone] [facility]   Nerve / Sites Muscle Latency Ref. Amplitude Ref. Rel Amp Segments Distance Velocity Ref. Area   ms ms mV mV %  cm m/s m/s mVms R Peroneal - EDB    Ankle EDB 4.2 ?6.5 3.7 ?2.0 100 Ankle - EDB 9   15.7    Fib head EDB 10.6  3.5  95.2 Fib head - Ankle 28 44 ?44 15.2    Pop fossa EDB 12.9  1.9  53.3 Pop fossa - Fib head 10 44 ?44 10.7        Pop fossa - Ankle     L Peroneal - EDB    Ankle EDB 4.0 ?6.5 3.7 ?2.0 100 Ankle - EDB 9   16.3    Fib head EDB 10.3  3.4  93.5 Fib head - Ankle 28 45 ?44 15.7    Pop fossa EDB 12.6  1.3  38.3 Pop fossa - Fib head 10 44 ?44 7.9        Pop fossa - Ankle     R Tibial - AH    Ankle AH 3.0 ?5.8 4.9 ?4.0 100 Ankle - AH 9   10.2    Pop fossa AH  10.6  1.4  29.4 Pop fossa - Ankle 31 41 ?41 3.8 L Tibial - AH    Ankle AH 3.1 ?5.8 3.2 ?4.0 100 Ankle - AH 9   7.9    Pop fossa AH 12.6  1.1  35 Pop fossa - Ankle 32 34 ?41 1.8           SNC   Nerve / Sites Rec. Site Peak Lat Ref.  Amp Ref. Segments Distance   ms ms V V  cm R Sural - Ankle (Calf)    Calf Ankle 3.9 ?4.4 6 ?6 Calf - Ankle 14 L Sural - Ankle (Calf)    Calf Ankle 3.8 ?4.4 8 ?6 Calf - Ankle 14 R Superficial peroneal - Ankle    Lat leg Ankle 3.8 ?4.4 2 ?6 Lat leg - Ankle 14 L Superficial peroneal - Ankle    Lat leg Ankle 3.8 ?4.4 4 ?6 Lat leg - Ankle 14           F  Wave   Nerve F Lat Ref.  ms ms R Tibial - AH 48.6 ?56.0 L Tibial - AH 55.0 ?56.0       EMG Summary Table   Spontaneous MUAP Recruitment Muscle IA Fib PSW Fasc Other Amp Dur. Poly Pattern R. Vastus medialis Normal None None None _______ Normal Normal Normal Normal R. Tibialis anterior Normal 1+ 1+ None _______ Normal Normal Normal Normal R. Gastrocnemius (Medial head) Normal None None None _______ Normal Normal Normal Normal R. Lumbar paraspinals Normal None None None _______ Normal Normal Normal Normal     EKG: NSR with incomplete LBBB at 112 ms (personally  reviewed)  TELEMETRY: NSR/Sinus brady 50-60s  (personally reviewed)  [employer] monitor strips reviewed personally by Dr. Elberta Fortis which showed significant sinus pauses  Assessment/Plan: 1.  Recurrent falls 2. Sinus Pause With recurrent falls and pauses on monitoring of up to 3.6 seconds. That specific pause did not correspond with a fall per family member.  No AV nodal blocking agents. She is on amitriptyline, which has been associated with QT prolongation and heart block.   TSH as below Echo has been done, pending.  She is NPO.  Explained risks, benefits, and alternatives to PPM implantation, including but not limited to bleeding, infection, pneumothorax, pericardial effusion, lead dislodgement, heart attack, stroke, or death.  Pt verbalized understanding and agrees to  proceed if indicated and no reversible cause is seen on remaining work up.    3. Hypothyroidism -- TSH 11, Free T4 normal.    4. Depression  Continue home medications   5. DM2 Metformin held.  Can add ISS coverage as needed once no longer NPO.   6. Bladder retention She reports having a bladder stimulation device, Geraldina Parrott assess if this would be likely to interfere with pacemaker function.   For questions or updates, please contact [facility] Please consult [email or web address] for contact info under Cardiology/STEMI.  Signed, Graciella Freer, PA-C  08/16/2019 8:38 AM   I have seen and examined this patient with Otilio Saber.  Agree with above, note added to reflect my findings.  On exam, RRR, no murmurs, lungs clear. Patent admitted with falls and pauses found on monitor. Due to that, Kaziah Krizek plan for pacemaker implant. Risks and benefits discussed. Risks include bleeding, tamponade, infection, pneumothorax, lead dislodgement. The patient understands the risks and has agreed to the procedure.    Amee Boothe M. Laketa Sandoz MD 08/16/2019 9:59 AM

## 2019-08-16 NOTE — Interval H&P Note (Signed)
Jillian Leblanc has presented today for surgery, with the diagnosis of sick sinus syndrome.  The various methods of treatment have been discussed with the patient and family. After consideration of risks, benefits and other options for treatment, the patient has consented to  Procedure(s): Pacemaker implant as a surgical intervention .  Risks include but not limited to bleeding, tamponade, infection, pneumothorax, among others. The patient's history has been reviewed, patient examined, no change in status, stable for surgery.  I have reviewed the patient's chart and labs.  Questions were answered to the patient's satisfaction.    Dashae Wilcher Elberta Fortis, MD 08/16/2019 11:14 AM

## 2019-08-16 NOTE — H&P (Signed)
Cardiology Admission History and Physical:   Patient ID: Jillian Leblanc MRN: [id number]; DOB: October 10, 1945   Admission date: 08/15/2019  Primary Care Provider: Karle Plumber, MD [facility] Cardiologist: No primary care provider on file. [facility] Electrophysiologist:  None   Chief Complaint:  Chest pain  Patient Profile:   Jillian Leblanc is a 74 y.o. female with DM2, thyroid disease, who presents after she was found to have sinus pauses on holter monitor.   History of Present Illness:   Jillian Leblanc reports that she has been experiencing frequent falls over the last sever months. She has undergone extensive evaluation by her PMD, neurology, and cardiology. She reported underwent a stress test yesterday and was given a Holter monitor when she went home. She was alerted yesterday by the monitoring company that she had an abnormal heart rhythm and was to report to the ER.   Upon arrival she was afebrile, HDS. EKG with SR, no ischemic changes, no block. Labs largely unremarkable, though hsTn 67-> 69. CT head without acute intracranial injury and CXR wnl. COVID negative.   In pressing her more about her falls, she states that she doesn't think she loses conciousness, but that her legs "give out" suddenly. She doesn't have antecedent lightheadedness or dizziness, but it happens so quickly that she is unable to catch herself (which has resulted in recent facial trauma). She has had 27 total falls and is constantly fearful of another.   I called [employer] who reported that she had a 3.6 second pause at 10AM on 7/22. When I asked the patient about this time, she told me that she did not think the monitor had been hooked up yet, as her doctors appointment yesterday was at [facility]. She does report a fall yesterday but thinks it occurred in the afternoon.   On ROS, no fevers, chills. No N/V/D. Does have some chest wall pain but this is reproducible with palpation and did not respond to nitro in  the ER. No LE edema, orthopnea, or PND.    Past Medical History:  Diagnosis Date  . Diabetes mellitus without complication (HCC)   . Interstitial cystitis   . Thyroid disease     Past Surgical History:  Procedure Laterality Date  . ABDOMINAL HYSTERECTOMY    . CHOLECYSTECTOMY    . NECK SURGERY    . SHOULDER SURGERY    . THYROIDECTOMY       Medications Prior to Admission: Prior to Admission medications   Medication Sig Start Date End Date Taking? Authorizing Provider  amitriptyline (ELAVIL) 50 MG tablet Take 50 mg by mouth at bedtime. 05/08/17   [provider]  ASPIRIN LOW DOSE 81 MG EC tablet Take 81 mg by mouth daily. 06/16/19   [provider]  calcium carbonate (TUMS EX) 750 MG chewable tablet Chew by mouth. 04/10/11   [provider]  citalopram (CELEXA) 20 MG tablet Take 20 mg by mouth at bedtime. 10/03/14   [provider]  diclofenac Sodium (VOLTAREN) 1 % GEL Apply topically as needed. 07/23/16   [provider]  fluticasone (FLONASE) 50 MCG/ACT nasal spray as needed. 06/09/17   [provider]  hydrOXYzine (ATARAX/VISTARIL) 25 MG tablet Take 1-2 tablets by mouth every evening. 06/14/19   [provider]  ibuprofen (ADVIL) 600 MG tablet Take 600 mg by mouth 3 (three) times daily. 06/11/19   [provider]  insulin glargine, 1 Unit Dial, (TOUJEO) 300 UNIT/ML Solostar Pen Inject 15 Units into the skin  daily.    [provider]  levothyroxine (SYNTHROID) 100 MCG tablet Take 100 mcg by mouth daily. 05/14/19   [provider]  metFORMIN (GLUCOPHAGE) 500 MG tablet Take 1,000 mg by mouth in the morning and at bedtime.    [provider]  omeprazole (PRILOSEC) 40 MG capsule Take 40 mg by mouth as needed. 03/17/18   [provider]  ondansetron (ZOFRAN) 4 MG tablet Take 4 mg by mouth every 8 (eight) hours as needed for nausea.    [provider]  oxybutynin (DITROPAN-XL) 10 MG  24 hr tablet Take 10 mg by mouth daily. 06/11/19   [provider]  oxyCODONE-acetaminophen (PERCOCET) 10-325 MG per tablet Take 1 tablet by mouth every 4 (four) hours as needed for pain.    [provider]  promethazine (PHENERGAN) 25 MG tablet Take 25 mg by mouth as needed. 09/28/16   [provider]  rOPINIRole (REQUIP) 3 MG tablet Take 3 mg by mouth at bedtime. 06/08/19   [provider]     Allergies:    Allergies  Allergen Reactions  . Pentosan Polysulfate Sodium     Other reaction(s): Other (See Comments) ELMIRON Y Drug hair fell out 10/21/2009 12:00:00 AM by Johny Blamer CNA 1  . Elmiron [Pentosan Polysulfate]   . Naloxone Other (See Comments)    Hallucinations Confusion Nightmares    Social History:   Social History   Socioeconomic History  . Marital status: Married    Spouse name: Not on file  . Number of children: Not on file  . Years of education: Not on file  . Highest education level: Not on file  Occupational History  . Not on file  Tobacco Use  . Smoking status: Never Smoker  . Smokeless tobacco: Never Used  Substance and Sexual Activity  . Alcohol use: No  . Drug use: Never  . Sexual activity: Not on file  Other Topics Concern  . Not on file  Social History Narrative  . Not on file   Social Determinants of Health   Financial Resource Strain:   . Difficulty of Paying Living Expenses:   Food Insecurity:   . Worried About Programme researcher, broadcasting/film/video in the Last Year:   . Barista in the Last Year:   Transportation Needs:   . Freight forwarder (Medical):   Marland Kitchen Lack of Transportation (Non-Medical):   Physical Activity:   . Days of Exercise per Week:   . Minutes of Exercise per Session:   Stress:   . Feeling of Stress :   Social Connections:   . Frequency of Communication with Friends and Family:   . Frequency of Social Gatherings with Friends and Family:   . Attends Religious Services:   . Active Member of  Clubs or Organizations:   . Attends Banker Meetings:   Marland Kitchen Marital Status:   Intimate Partner Violence:   . Fear of Current or Ex-Partner:   . Emotionally Abused:   Marland Kitchen Physically Abused:   . Sexually Abused:     Family History:   The patient's family history is not on file.    ROS:  Please see the history of present illness.  All other ROS reviewed and negative.     Physical Exam/Data:   Vitals:   08/15/19 2231 08/16/19 0044 08/16/19 0117 08/16/19 0207  BP: (!) 127/63 (!) 143/71 (!) 163/64 (!) 182/85  Pulse: 66 78 65   Resp: 19 19 20  18  Temp:    98.2 F (36.8 C)  TempSrc:    Oral  SpO2: 100% 100% 98%   Weight:    55 kg  Height:       No intake or output data in the 24 hours ending 08/16/19 0224 Last 3 Weights 08/16/2019 08/15/2019 08/13/2019  Weight (lbs) 121 lb 4.1 oz 122 lb 8 oz 270 lb  Weight (kg) 55 kg 55.566 kg 122.471 kg     Body mass index is 23.68 kg/m.  General:  Well nourished, well developed, in no acute distress HEENT: normal. Laceration noted above left eye with ecchymosis. Neck: no JVD Endocrine:  No thryomegaly Vascular: No carotid bruits; FA pulses 2+ bilaterally without bruits  Cardiac:  normal S1, S2; RRR; no murmur. Chest wall tender to palpation. Lungs:  clear to auscultation bilaterally, no wheezing, rhonchi or rales  Abd: soft, nontender, no hepatomegaly  Ext: no edema Musculoskeletal:  No deformities, BUE and BLE strength normal and equal Skin: warm and dry  Psych:  Normal affect   EKG:  The ECG that was done was SR, normal axis, normal intervals. No Q waves or ST changes. No obvious AV block.   Relevant CV Studies: TTE pending.   Laboratory Data:  High Sensitivity Troponin:   Recent Labs  Lab 08/15/19 1736 08/15/19 1946  TROPONINIHS 67* 64*      Chemistry Recent Labs  Lab 08/15/19 1736  NA 135  K 3.9  CL 100  CO2 25  GLUCOSE 201*  BUN 15  CREATININE 1.12*  CALCIUM 7.2*  GFRNONAA 49*  GFRAA 57*  ANIONGAP 10      No results for input(s): PROT, ALBUMIN, AST, ALT, ALKPHOS, BILITOT in the last 168 hours. Hematology Recent Labs  Lab 08/15/19 1736  WBC 5.6  RBC 3.91  HGB 10.4*  HCT 33.0*  MCV 84.4  MCH 26.6  MCHC 31.5  RDW 15.8*  PLT 285   BNPNo results for input(s): BNP, PROBNP in the last 168 hours.  DDimer No results for input(s): DDIMER in the last 168 hours.   Radiology/Studies:  DG Chest 2 View  Result Date: 08/15/2019 CLINICAL DATA:  Chest pain. EXAM: CHEST - 2 VIEW COMPARISON:  Chest x-ray dated Jun 22, 2019. FINDINGS: The heart size and mediastinal contours are within normal limits. Atherosclerotic calcification of the aortic arch. Normal pulmonary vascularity. Linear atelectasis in the right upper lobe and lingula. No focal consolidation, pleural effusion, or pneumothorax. No acute osseous abnormality. Chronic T12 compression deformity. IMPRESSION: 1. No acute cardiopulmonary disease. Electronically Signed   By: Obie Dredge M.D.   On: 08/15/2019 17:55   CT Head Wo Contrast  Result Date: 08/15/2019 CLINICAL DATA:  Facial trauma recent falls EXAM: CT HEAD WITHOUT CONTRAST TECHNIQUE: Contiguous axial images were obtained from the base of the skull through the vertex without intravenous contrast. COMPARISON:  CT brain 08/13/2019 FINDINGS: Brain: No acute territorial infarction, hemorrhage or intracranial mass. Nonenlarged ventricles. Vascular: No hyperdense vessel or unexpected calcification. Skull: Normal. Negative for fracture or focal lesion. Sinuses/Orbits: No acute finding. Other: Decreased left periorbital hematoma IMPRESSION: 1. No CT evidence for acute intracranial abnormality. 2. Decreased left periorbital hematoma. Electronically Signed   By: Jasmine Pang M.D.   On: 08/15/2019 19:25    Assessment and Plan:   1. Recurrent falls 2. Sinus Pause -- Patient instructed to come to the ER on the basis of recurrent falls and single pause of 3.6 seconds. Per patient history, pause  did  not correspond with her recent fall but would be good to confirm with family member. Do not have additional longitudinal monitoring data.  -- No evidence of block on EKG. -- Telemetry monitoring -- check TSH, Free T4 -- Complete TTE ordered -- Keep NPO in case of PPM need; no heparin products.   3. Hypothyroidism -- Continue home synthyroid  4. Depression  -- Continue home medications.   5. DM2 -- FSG while NPO; can add ISS coverage PRN -- Hold metformin.   Severity of Illness: The appropriate patient status for this patient is INPATIENT. Inpatient status is judged to be reasonable and necessary in order to provide the required intensity of service to ensure the patient's safety. The patient's presenting symptoms, physical exam findings, and initial radiographic and laboratory data in the context of their chronic comorbidities is felt to place them at high risk for further clinical deterioration. Furthermore, it is not anticipated that the patient will be medically stable for discharge from the hospital within 2 midnights of admission. The following factors support the patient status of inpatient.   " The patient's presenting symptoms include falls. " The worrisome physical exam findings include n/a " The initial radiographic and laboratory data are worrisome because of elevated troponin. " The chronic co-morbidities include thyroid disease.  * I certify that at the point of admission it is my clinical judgment that the patient will require inpatient hospital care spanning beyond 2 midnights from the point of admission due to high intensity of service, high risk for further deterioration and high frequency of surveillance required.*   For questions or updates, please contact [facility] Please consult [email or web address] for contact info under    Signed, Laurell JosephsLauren K Levander Katzenstein, MD  08/16/2019 2:24 AM

## 2019-08-16 NOTE — Progress Notes (Signed)
Chaplain provided education around Advanced Directive and offered support.  Chaplain will follow-up with Wynelle and daughter upon completion of paperwork.

## 2019-08-17 ENCOUNTER — Inpatient Hospital Stay (HOSPITAL_COMMUNITY): Payer: Medicare Other

## 2019-08-17 DIAGNOSIS — I455 Other specified heart block: Principal | ICD-10-CM

## 2019-08-17 LAB — GLUCOSE, CAPILLARY: Glucose-Capillary: 139 mg/dL — ABNORMAL HIGH (ref 70–99)

## 2019-08-17 MED ORDER — LOSARTAN POTASSIUM 25 MG PO TABS
25.0000 mg | ORAL_TABLET | Freq: Every day | ORAL | 6 refills | Status: DC
Start: 2019-08-17 — End: 2020-07-29

## 2019-08-17 MED ORDER — LEVOTHYROXINE SODIUM 125 MCG PO TABS: 125.0000 ug | ORAL_TABLET | Freq: Every day | ORAL | 1 refills | Status: DC

## 2019-08-17 NOTE — Progress Notes (Signed)
Patient Name: Jillian Leblanc  admitted w recurrent falls and sinus pauses >>3.6 sec (not correlating)     SUBJECTIVE: Without chest pain.  Feels somewhat better.  History reviewed.  Not suggestive of carotid sinus hypersensitivity.  Past Medical History:  Diagnosis Date  . Diabetes mellitus without complication (HCC)   . Interstitial cystitis   . Thyroid disease     Scheduled Meds:  Scheduled Meds: . amitriptyline  50 mg Oral QHS  . aspirin EC  81 mg Oral Daily  . Chlorhexidine Gluconate Cloth  6 each Topical Q0600  . citalopram  20 mg Oral QHS  . hydrOXYzine  25 mg Oral QPM  . levothyroxine  100 mcg Oral Q0600  . mupirocin ointment  1 application Nasal BID  . oxybutynin  10 mg Oral Daily  . rOPINIRole  3 mg Oral QHS  . sodium chloride flush  3 mL Intravenous Once   Continuous Infusions: acetaminophen, acetaminophen, ondansetron (ZOFRAN) IV, oxyCODONE-acetaminophen **AND** oxyCODONE    PHYSICAL EXAM Vitals:   08/16/19 1753 08/16/19 1929 08/16/19 2357 08/17/19 0323  BP: (!) 154/63 (!) 154/86 (!) 142/80 (!) 170/72  Pulse: 65 70 66 62  Resp: 18 18 18 18   Temp: 98.7 F (37.1 C) 98.2 F (36.8 C) 98 F (36.7 C) 98.2 F (36.8 C)  TempSrc: Oral Oral Oral Oral  SpO2: 98% 98% 98% 98%  Weight:    54 kg  Height:       Well developed and nourished in no acute distress HENT normal Neck supple with JVP-  flat   Pocket without  hematoma, swelling or tenderness  Clear Regular rate and rhythm, no murmurs or gallops Abd-soft with active BS No Clubbing cyanosis edema Skin-warm and dry A & Oriented  Grossly normal sensory and motor function    TELEMETRY: Reviewed personnally pt in intermittent atrial pacing.  Without pauses.:  ECG personally reviewed atrial pacing at 61   Intake/Output Summary (Last 24 hours) at 08/17/2019 0806 Last data filed at 08/17/2019 0441 Gross per 24 hour  Intake 340 ml  Output --  Net 340 ml    LABS: Basic Metabolic  Panel: Recent Labs  Lab 08/15/19 1736 08/16/19 0448  NA 135 136  K 3.9 3.8  CL 100 102  CO2 25 25  GLUCOSE 201* 143*  BUN 15 11  CREATININE 1.12* 1.13*  CALCIUM 7.2* 7.0*   Cardiac Enzymes: No results for input(s): CKTOTAL, CKMB, CKMBINDEX, TROPONINI in the last 72 hours. CBC: Recent Labs  Lab 08/15/19 1736  WBC 5.6  HGB 10.4*  HCT 33.0*  MCV 84.4  PLT 285   PROTIME: No results for input(s): LABPROT, INR in the last 72 hours. Liver Function Tests: Recent Labs    08/16/19 0448  AST 18  ALT 13  ALKPHOS 52  BILITOT 0.3  PROT 6.5  ALBUMIN 3.2*   No results for input(s): LIPASE, AMYLASE in the last 72 hours. BNP: BNP (last 3 results) No results for input(s): BNP in the last 8760 hours.  ProBNP (last 3 results) No results for input(s): PROBNP in the last 8760 hours.  D-Dimer: No results for input(s): DDIMER in the last 72 hours. Hemoglobin A1C: Recent Labs    08/16/19 0448  HGBA1C 7.6*   Fasting Lipid Panel: No results for input(s): CHOL, HDL, LDLCALC, TRIG, CHOLHDL, LDLDIRECT in the last 72 hours. Thyroid Function Tests: Recent Labs    08/16/19 0448  TSH 11.133*   Anemia Panel: No results  for input(s): VITAMINB12, FOLATE, FERRITIN, TIBC, IRON, RETICCTPCT in the last 72 hours.   Device Interrogation: normal device function   ASSESSMENT AND PLAN:  Active Problems:   Sinus arrest   Recurrent falls Hypertension Hypothyroidism  Will increase levothyroxine from 100--125 mcg.  Will need outpatient follow-up We will add losartan forBP    Instructions given Discussed that pacing may be an incomplete answer     Signed, Sherryl Manges MD  08/17/2019

## 2019-08-17 NOTE — Discharge Summary (Signed)
Discharge Summary    Patient ID: Jillian Leblanc MRN: [id number]; DOB: 08/26/1945  Admit date: 08/15/2019 Discharge date: 08/17/2019  Primary Care Provider: [name], Moogali M, MD  Primary Cardiologist: No primary care provider on file.  Primary Electrophysiologist:  Will Jorja LoaMartin Camnitz, MD   Discharge Diagnoses    Principal Problem:   Sinus arrest Active Problems:   Recurrent falls   Allergies Allergies  Allergen Reactions  . Pentosan Polysulfate Sodium     Other reaction(s): Other (See Comments) ELMIRON Y Drug hair fell out 10/21/2009 12:00:00 AM by Johny BlamerLaquana Glenn CNA 1  . Elmiron [Pentosan Polysulfate]   . Naloxone Other (See Comments)    Hallucinations Confusion Nightmares    Diagnostic Studies/Procedures    SURGEON:  Will Jorja LoaMartin Camnitz, MD     PREPROCEDURE DIAGNOSIS:  Sinus node dysfunction    POSTPROCEDURE DIAGNOSIS:  Sinus node dysfunction     PROCEDURES:   1. Pacemaker implantation.     INTRODUCTION: Jillian AbideGlenda Pabst is a 74 y.o. female  with a history of bradycardia who presents today for pacemaker implantation.  The patient reports intermittent episodes of falls, dizziness over the past few months.  No reversible causes have been identified.  The patient therefore presents today for pacemaker implantation.     DESCRIPTION OF PROCEDURE:  Informed written consent was obtained, and   the patient was brought to the electrophysiology lab in a fasting state.  The patient required no sedation for the procedure today.  The patients left chest was prepped and draped in the usual sterile fashion by the EP lab staff. The skin overlying the left deltopectoral region was infiltrated with lidocaine for local analgesia.  A 4-cm incision was made over the left deltopectoral region.  A left subcutaneous pacemaker pocket was fashioned using a combination of sharp and blunt dissection. Electrocautery was required to assure hemostasis.    RA/RV Lead Placement: The left axillary  vein was cannulated.  Through the left axillary vein, a Medtronic model 5076 (serial number PJN [id number]) right atrial lead and a Medtronic model 5076 (serial number PJN [id number]) right ventricular lead were advanced with fluoroscopic visualization into the right atrial appendage and right ventricular apex positions respectively.  Initial atrial lead P- waves measured 3mV with impedance of 698 ohms and a threshold of 1.6 V at 0.5 msec.  Right ventricular lead R-waves measured 9.2 mV with an impedance of 979 ohms and a threshold of 1.3 V at 0.5 msec.  Both leads were secured to the pectoralis fascia using #2-0 silk over the suture sleeves.   Device Placement:  The leads were then connected to a Medtronic Azure XT DR MRI SureScan (serial number [id number]) pacemaker.  The pocket was irrigated with copious gentamicin solution.  The pacemaker was then placed into the pocket.  The pocket was then closed in 3 layers with 2.0 Vicryl suture for the subcutaneous and 3.0 Vicryl suture subcuticular layers.  Steri-Strips and a sterile dressing were then applied. EBL<7510ml. There were no early apparent complications.     CONCLUSIONS:   1. Successful implantation of a Medtronic Azure XT DR MRI SureScan dual-chamber pacemaker for symptomatic bradycardia  2. No early apparent complications.     _____________   History of Present Illness     Jillian AbideGlenda Lansky is a 74 y.o. female with DM2 and thyroid disease.  She came to the hospital on 7/23 for sinus pauses in the setting of frequent falls.  She was evaluated by Dr. Elberta Fortisamnitz for sinus pauses at  the request of Dr. Wyline Mood.  Hospital Course     Consultants: EP  Dr. Elberta Fortis evaluated Ms. Kazmierczak and was found to have had frequent falls (27) over the last several months.  With sinus pauses on a Holter monitor, pacemaker was indicated.  A Medtronic dual-lead pacemaker was inserted on 7/23, procedure results are above.  She tolerated the procedure well, no immediate  complication.  On 7/24, she was seen by Dr. Graciela Husbands and all data were reviewed.  Her TSH was elevated and her Synthroid was increased.  She was noted to need better blood pressure control, losartan was added to her medication regimen.  She is hypocalcemic.  An ionized calcium was drawn, results pending.  This will need to be followed as an outpatient.  Continue Tums for now.  Her chest x-ray was without pneumothorax.  Her pacemaker site was without hematoma or significant ecchymosis.  She ambulated well and is considered stable for discharge, to follow-up as an outpatient.   Did the patient have an acute coronary syndrome (MI, NSTEMI, STEMI, etc) this admission?:  No.   The elevated Troponin was due to the acute medical illness (demand ischemia).    _____________  Discharge Vitals Blood pressure (!) 170/72, pulse 62, temperature 98.2 F (36.8 C), temperature source Oral, resp. rate 18, height 5' (1.524 m), weight 54 kg, SpO2 98 %.  Filed Weights   08/15/19 1658 08/16/19 0207 08/17/19 0323  Weight: 55.6 kg 55 kg 54 kg    Labs & Radiologic Studies    CBC Recent Labs    08/15/19 1736  WBC 5.6  HGB 10.4*  HCT 33.0*  MCV 84.4  PLT 285   Basic Metabolic Panel Recent Labs    16/10/96 1736 08/16/19 0448  NA 135 136  K 3.9 3.8  CL 100 102  CO2 25 25  GLUCOSE 201* 143*  BUN 15 11  CREATININE 1.12* 1.13*  CALCIUM 7.2* 7.0*   Liver Function Tests Recent Labs    08/16/19 0448  AST 18  ALT 13  ALKPHOS 52  BILITOT 0.3  PROT 6.5  ALBUMIN 3.2*   No results for input(s): LIPASE, AMYLASE in the last 72 hours. High Sensitivity Troponin:   Recent Labs  Lab 08/15/19 1736 08/15/19 1946  TROPONINIHS 67* 64*    BNP Invalid input(s): POCBNP D-Dimer No results for input(s): DDIMER in the last 72 hours. Hemoglobin A1C Recent Labs    08/16/19 0448  HGBA1C 7.6*   Fasting Lipid Panel No results for input(s): CHOL, HDL, LDLCALC, TRIG, CHOLHDL, LDLDIRECT in the last 72  hours. Thyroid Function Tests Recent Labs    08/16/19 0448  TSH 11.133*   _____________  DG Chest 2 View  Result Date: 08/17/2019 CLINICAL DATA:  74 year old female with history of pacemaker placement. EXAM: CHEST - 2 VIEW COMPARISON:  Chest x-ray 08/15/2019. FINDINGS: New left-sided pacemaker device in place with lead tips projecting over the expected location of the right atrium and right ventricle. Curvilinear opacity projecting over the left mid lung, favored to be exterior to the patient as this is seen only on the frontal projection. Lung volumes are low. Linear subsegmental atelectasis or scarring in the right mid lung, similar to the prior study. No consolidative airspace disease. No pleural effusions. No pneumothorax. No pulmonary nodule or mass noted. Pulmonary vasculature and the cardiomediastinal silhouette are within normal limits. Aortic atherosclerosis. IMPRESSION: 1. Status post left-sided pacemaker placement, as above, without acute complicating features. 2. Aortic atherosclerosis. Electronically Signed  By: Trudie Reed M.D.   On: 08/17/2019 09:46   DG Chest 2 View  Result Date: 08/15/2019 CLINICAL DATA:  Chest pain. EXAM: CHEST - 2 VIEW COMPARISON:  Chest x-ray dated Jun 22, 2019. FINDINGS: The heart size and mediastinal contours are within normal limits. Atherosclerotic calcification of the aortic arch. Normal pulmonary vascularity. Linear atelectasis in the right upper lobe and lingula. No focal consolidation, pleural effusion, or pneumothorax. No acute osseous abnormality. Chronic T12 compression deformity. IMPRESSION: 1. No acute cardiopulmonary disease. Electronically Signed   By: Obie Dredge M.D.   On: 08/15/2019 17:55   CT Head Wo Contrast  Result Date: 08/15/2019 CLINICAL DATA:  Facial trauma recent falls EXAM: CT HEAD WITHOUT CONTRAST TECHNIQUE: Contiguous axial images were obtained from the base of the skull through the vertex without intravenous contrast.  COMPARISON:  CT brain 08/13/2019 FINDINGS: Brain: No acute territorial infarction, hemorrhage or intracranial mass. Nonenlarged ventricles. Vascular: No hyperdense vessel or unexpected calcification. Skull: Normal. Negative for fracture or focal lesion. Sinuses/Orbits: No acute finding. Other: Decreased left periorbital hematoma IMPRESSION: 1. No CT evidence for acute intracranial abnormality. 2. Decreased left periorbital hematoma. Electronically Signed   By: Jasmine Pang M.D.   On: 08/15/2019 19:25   CT HEAD WO CONTRAST  Result Date: 08/13/2019 CLINICAL DATA:  Fall, initial encounter. Neck pain. Laceration/bruising over left eye, headache. EXAM: CT HEAD WITHOUT CONTRAST CT CERVICAL SPINE WITHOUT CONTRAST TECHNIQUE: Multidetector CT imaging of the head and cervical spine was performed following the standard protocol without intravenous contrast. Multiplanar CT image reconstructions of the cervical spine were also generated. COMPARISON:  Head CT 06/22/2019, CT cervical spine 06/21/2020 FINDINGS: CT HEAD FINDINGS Brain: Cerebral volume is normal for age. There is no acute intracranial hemorrhage. No demarcated cortical infarct. No extra-axial fluid collection. No evidence of intracranial mass. No midline shift. Vascular: No hyperdense vessel. Skull: No calvarial fracture. Small nonspecific lucent foci within the occipital calvarium which are nonspecific, but may reflect prominent arachnoid granulations Sinuses/Orbits: Prominent left periorbital hematoma/laceration no significant paranasal sinus disease or mastoid effusion at the imaged levels CT CERVICAL SPINE FINDINGS Alignment: Reversal of the expected cervical lordosis. Trace C3-C4 and C4-C5 grade 1 anterolisthesis. Skull base and vertebrae: The basion-dental and atlanto-dental intervals are maintained.No evidence of acute fracture to the cervical spine. Soft tissues and spinal canal: No prevertebral fluid or swelling. No visible canal hematoma. Disc levels:  Cervical spondylosis. Most notably at C6-C7, there is moderate/advanced disc space narrowing with a posterior disc osteophyte complex and uncovertebral hypertrophy. Upper chest: No consolidation within the imaged lung apices. No visible pneumothorax. Other: Calcified atherosclerotic plaque within the visualized aortic arch, proximal major branch vessels of the neck and carotid arteries. IMPRESSION: CT head: 1. No evidence of acute intracranial abnormality. 2. Prominent left periorbital hematoma/laceration. CT cervical spine: 1. No evidence of acute fracture to the cervical spine. 2. Nonspecific reversal of the expected cervical lordosis. 3. Trace C3-C4 and C4-C5 grade 1 anterolisthesis. 4. Cervical spondylosis as described and greatest at C6-C7. Electronically Signed   By: Jackey Loge DO   On: 08/13/2019 11:30   CT CERVICAL SPINE WO CONTRAST  Result Date: 08/13/2019 CLINICAL DATA:  Fall, initial encounter. Neck pain. Laceration/bruising over left eye, headache. EXAM: CT HEAD WITHOUT CONTRAST CT CERVICAL SPINE WITHOUT CONTRAST TECHNIQUE: Multidetector CT imaging of the head and cervical spine was performed following the standard protocol without intravenous contrast. Multiplanar CT image reconstructions of the cervical spine were also generated. COMPARISON:  Head CT  06/22/2019, CT cervical spine 06/21/2020 FINDINGS: CT HEAD FINDINGS Brain: Cerebral volume is normal for age. There is no acute intracranial hemorrhage. No demarcated cortical infarct. No extra-axial fluid collection. No evidence of intracranial mass. No midline shift. Vascular: No hyperdense vessel. Skull: No calvarial fracture. Small nonspecific lucent foci within the occipital calvarium which are nonspecific, but may reflect prominent arachnoid granulations Sinuses/Orbits: Prominent left periorbital hematoma/laceration no significant paranasal sinus disease or mastoid effusion at the imaged levels CT CERVICAL SPINE FINDINGS Alignment: Reversal of  the expected cervical lordosis. Trace C3-C4 and C4-C5 grade 1 anterolisthesis. Skull base and vertebrae: The basion-dental and atlanto-dental intervals are maintained.No evidence of acute fracture to the cervical spine. Soft tissues and spinal canal: No prevertebral fluid or swelling. No visible canal hematoma. Disc levels: Cervical spondylosis. Most notably at C6-C7, there is moderate/advanced disc space narrowing with a posterior disc osteophyte complex and uncovertebral hypertrophy. Upper chest: No consolidation within the imaged lung apices. No visible pneumothorax. Other: Calcified atherosclerotic plaque within the visualized aortic arch, proximal major branch vessels of the neck and carotid arteries. IMPRESSION: CT head: 1. No evidence of acute intracranial abnormality. 2. Prominent left periorbital hematoma/laceration. CT cervical spine: 1. No evidence of acute fracture to the cervical spine. 2. Nonspecific reversal of the expected cervical lordosis. 3. Trace C3-C4 and C4-C5 grade 1 anterolisthesis. 4. Cervical spondylosis as described and greatest at C6-C7. Electronically Signed   By: Jackey Loge DO   On: 08/13/2019 11:30   EP PPM/ICD IMPLANT  Result Date: 08/16/2019 SURGEON:  Regan Lemming, MD   PREPROCEDURE DIAGNOSIS:  Sinus node dysfunction   POSTPROCEDURE DIAGNOSIS:  Sinus node dysfunction    PROCEDURES:  1. Pacemaker implantation.   INTRODUCTION: Jillian Leblanc is a 74 y.o. female  with a history of bradycardia who presents today for pacemaker implantation.  The patient reports intermittent episodes of falls, dizziness over the past few months.  No reversible causes have been identified.  The patient therefore presents today for pacemaker implantation.   DESCRIPTION OF PROCEDURE:  Informed written consent was obtained, and  the patient was brought to the electrophysiology lab in a fasting state.  The patient required no sedation for the procedure today.  The patients left chest was prepped  and draped in the usual sterile fashion by the EP lab staff. The skin overlying the left deltopectoral region was infiltrated with lidocaine for local analgesia.  A 4-cm incision was made over the left deltopectoral region.  A left subcutaneous pacemaker pocket was fashioned using a combination of sharp and blunt dissection. Electrocautery was required to assure hemostasis.  RA/RV Lead Placement: The left axillary vein was cannulated.  Through the left axillary vein, a Medtronic model 5076 (serial number PJN [id number]) right atrial lead and a Medtronic model 5076 (serial number PJN [id number]) right ventricular lead were advanced with fluoroscopic visualization into the right atrial appendage and right ventricular apex positions respectively.  Initial atrial lead P- waves measured 3mV with impedance of 698 ohms and a threshold of 1.6 V at 0.5 msec.  Right ventricular lead R-waves measured 9.2 mV with an impedance of 979 ohms and a threshold of 1.3 V at 0.5 msec.  Both leads were secured to the pectoralis fascia using #2-0 silk over the suture sleeves. Device Placement:  The leads were then connected to a Medtronic Azure XT DR MRI SureScan (serial number [id number]) pacemaker.  The pocket was irrigated with copious gentamicin solution.  The pacemaker was then placed into the  pocket.  The pocket was then closed in 3 layers with 2.0 Vicryl suture for the subcutaneous and 3.0 Vicryl suture subcuticular layers.  Steri-Strips and a sterile dressing were then applied. EBL<49ml. There were no early apparent complications.   CONCLUSIONS:  1. Successful implantation of a Medtronic Azure XT DR MRI SureScan dual-chamber pacemaker for symptomatic bradycardia  2. No early apparent complications.       Will Jorja Loa, MD 08/16/2019 12:44 PM  DG Knee Complete 4 Views Left  Result Date: 08/13/2019 CLINICAL DATA:  Left knee pain EXAM: LEFT KNEE - COMPLETE 4+ VIEW COMPARISON:  November 18, 2017 FINDINGS: No acute fracture or  dislocation. Tricompartmental degenerative changes most pronounced in the medial compartment with moderate joint space narrowing and osteophyte formation. Enthesiophyte at the quadriceps tendon insertion on the patella. No area of erosion or osseous destruction. No unexpected radiopaque foreign body. Vascular calcifications. IMPRESSION: No acute fracture or dislocation. Electronically Signed   By: Meda Klinefelter MD   On: 08/13/2019 11:28   NCV with EMG(electromyography)  Result Date: 07/25/2019 Suanne Marker, MD     07/25/2019  3:58 PM  [facility] NCS (NERVE CONDUCTION STUDY) WITH EMG (ELECTROMYOGRAPHY) REPORT STUDY DATE: 07/25/19 PATIENT NAME: Jillian Leblanc DOB: 1945-11-29 MRN: [id number] ORDERING CLINICIAN: Melvyn Novas, MD TECHNOLOGIST: Durenda Age ELECTROMYOGRAPHER: Glenford Bayley. Penumalli, MD CLINICAL INFORMATION: 74 year old female with frequent falls and low back pain. FINDINGS: NERVE CONDUCTION STUDY: Bilateral peroneal and right tibial motor responses are normal. Left tibial motor response has normal distal latency, decreased amplitude and slow conduction velocity. Bilateral sural sensory responses are normal. Bilateral superficial peroneal sensory responses have decreased amplitudes. Bilateral tibial F wave latencies are normal. NEEDLE ELECTROMYOGRAPHY: Needle examination of right lower extremity vastus medialis, tibialis anterior, gastrocnemius and right lumbar paraspinal muscles notable for positive sharp waves and fibrillation potentials in the right tibialis anterior.  Normal motor unit recruitment on exertion. 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 INTERPRETING PHYSICIAN: Suanne Marker, MD Certified in Neurology, Neurophysiology and Neuroimaging [facility] [address], [address] [place], [place] [address] [phone] [facility]   Nerve / Sites Muscle Latency Ref. Amplitude Ref. Rel Amp Segments Distance Velocity  Ref. Area   ms ms mV mV %  cm m/s m/s mVms R Peroneal - EDB    Ankle EDB 4.2 ?6.5 3.7 ?2.0 100 Ankle - EDB 9   15.7    Fib head EDB 10.6  3.5  95.2 Fib head - Ankle 28 44 ?44 15.2    Pop fossa EDB 12.9  1.9  53.3 Pop fossa - Fib head 10 44 ?44 10.7        Pop fossa - Ankle     L Peroneal - EDB    Ankle EDB 4.0 ?6.5 3.7 ?2.0 100 Ankle - EDB 9   16.3    Fib head EDB 10.3  3.4  93.5 Fib head - Ankle 28 45 ?44 15.7    Pop fossa EDB 12.6  1.3  38.3 Pop fossa - Fib head 10 44 ?44 7.9        Pop fossa - Ankle     R Tibial - AH    Ankle AH 3.0 ?5.8 4.9 ?4.0 100 Ankle - AH 9   10.2    Pop fossa AH 10.6  1.4  29.4 Pop fossa - Ankle 31 41 ?41 3.8 L Tibial - AH    Ankle AH 3.1 ?5.8 3.2 ?4.0 100 Ankle -  AH 9   7.9    Pop fossa AH 12.6  1.1  35 Pop fossa - Ankle 32 34 ?41 1.8           SNC   Nerve / Sites Rec. Site Peak Lat Ref.  Amp Ref. Segments Distance   ms ms V V  cm R Sural - Ankle (Calf)    Calf Ankle 3.9 ?4.4 6 ?6 Calf - Ankle 14 L Sural - Ankle (Calf)    Calf Ankle 3.8 ?4.4 8 ?6 Calf - Ankle 14 R Superficial peroneal - Ankle    Lat leg Ankle 3.8 ?4.4 2 ?6 Lat leg - Ankle 14 L Superficial peroneal - Ankle    Lat leg Ankle 3.8 ?4.4 4 ?6 Lat leg - Ankle 14           F  Wave   Nerve F Lat Ref.  ms ms R Tibial - AH 48.6 ?56.0 L Tibial - AH 55.0 ?56.0       EMG Summary Table   Spontaneous MUAP Recruitment Muscle IA Fib PSW Fasc Other Amp Dur. Poly Pattern R. Vastus medialis Normal None None None _______ Normal Normal Normal Normal R. Tibialis anterior Normal 1+ 1+ None _______ Normal Normal Normal Normal R. Gastrocnemius (Medial head) Normal None None None _______ Normal Normal Normal Normal R. Lumbar paraspinals Normal None None None _______ Normal Normal Normal Normal    ECHOCARDIOGRAM COMPLETE  Result Date: 08/16/2019    ECHOCARDIOGRAM REPORT   Patient Name:   Jillian Leblanc Date of Exam: 08/16/2019 Medical Rec #:  [id number]      Height:       60.0 in Accession #:    [id number]     Weight:       121.3 lb Date of Birth:   03-02-1945      BSA:          1.509 m Patient Age:    74 years       BP:           157/71 mmHg Patient Gender: F              HR:           63 bpm. Exam Location:  Inpatient Procedure: 2D Echo, Cardiac Doppler and Color Doppler Indications:    Abnormal ECG  History:        Patient has no prior history of Echocardiogram examinations.  Sonographer:    Ross Ludwig RDCS (AE) Referring Phys: [id number] Salomon Fick TRUBY IMPRESSIONS  1. Left ventricular ejection fraction, by estimation, is 55 to 60%. The left ventricle has normal function. The left ventricle has no regional wall motion abnormalities. There is mild concentric left ventricular hypertrophy. Left ventricular diastolic parameters are consistent with Grade I diastolic dysfunction (impaired relaxation).  2. Right ventricular systolic function is normal. The right ventricular size is normal. There is normal pulmonary artery systolic pressure.  3. Left atrial size was mild to moderately dilated.  4. Right atrial size was mildly dilated.  5. The mitral valve is normal in structure. Trivial mitral valve regurgitation. No evidence of mitral stenosis.  6. The aortic valve is tricuspid. Aortic valve regurgitation is not visualized. Mild aortic valve sclerosis is present, with no evidence of aortic valve stenosis. Comparison(s): No prior Echocardiogram. Conclusion(s)/Recommendation(s): Normal biventricular function without evidence of hemodynamically significant valvular heart disease. FINDINGS  Left Ventricle: Left ventricular ejection fraction, by estimation, is 55 to 60%. The left ventricle has normal function.  The left ventricle has no regional wall motion abnormalities. The left ventricular internal cavity size was normal in size. There is  mild concentric left ventricular hypertrophy. Abnormal (paradoxical) septal motion, consistent with left bundle branch block. Left ventricular diastolic parameters are consistent with Grade I diastolic dysfunction (impaired  relaxation). Right Ventricle: The right ventricular size is normal. No increase in right ventricular wall thickness. Right ventricular systolic function is normal. There is normal pulmonary artery systolic pressure. The tricuspid regurgitant velocity is 2.16 m/s, and  with an assumed right atrial pressure of 8 mmHg, the estimated right ventricular systolic pressure is 26.7 mmHg. Left Atrium: Left atrial size was mild to moderately dilated. Right Atrium: Right atrial size was mildly dilated. Pericardium: There is no evidence of pericardial effusion. Mitral Valve: The mitral valve is normal in structure. Mild to moderate mitral annular calcification. Trivial mitral valve regurgitation. No evidence of mitral valve stenosis. Tricuspid Valve: The tricuspid valve is normal in structure. Tricuspid valve regurgitation is trivial. No evidence of tricuspid stenosis. Aortic Valve: The aortic valve is tricuspid. . There is mild thickening and mild calcification of the aortic valve. Aortic valve regurgitation is not visualized. Mild aortic valve sclerosis is present, with no evidence of aortic valve stenosis. Mild aortic valve annular calcification. There is mild thickening of the aortic valve. There is mild calcification of the aortic valve. Aortic valve mean gradient measures 3.0 mmHg. Aortic valve peak gradient measures 7.1 mmHg. Aortic valve area, by VTI measures 2.31 cm. Pulmonic Valve: The pulmonic valve was grossly normal. Pulmonic valve regurgitation is not visualized. No evidence of pulmonic stenosis. Aorta: The aortic root, ascending aorta and aortic arch are all structurally normal, with no evidence of dilitation or obstruction. Venous: The inferior vena cava was not well visualized. IAS/Shunts: The atrial septum is grossly normal.  LEFT VENTRICLE PLAX 2D LVIDd:         4.20 cm  Diastology LVIDs:         2.90 cm  LV e' lateral:   7.00 cm/s LV PW:         1.40 cm  LV E/e' lateral: 13.7 LV IVS:        1.40 cm  LV e'  medial:    6.00 cm/s LVOT diam:     1.90 cm  LV E/e' medial:  16.0 LV SV:         71 LV SV Index:   47 LVOT Area:     2.84 cm  RIGHT VENTRICLE RV S prime:     18.20 cm/s TAPSE (M-mode): 2.7 cm LEFT ATRIUM           Index       RIGHT ATRIUM           Index LA diam:      4.20 cm 2.78 cm/m  RA Area:     14.70 cm LA Vol (A2C): 65.2 ml 43.21 ml/m RA Volume:   37.50 ml  24.85 ml/m LA Vol (A4C): 42.3 ml 28.03 ml/m  AORTIC VALVE AV Area (Vmax):    2.03 cm AV Area (Vmean):   2.01 cm AV Area (VTI):     2.31 cm AV Vmax:           133.00 cm/s AV Vmean:          85.300 cm/s AV VTI:            0.308 m AV Peak Grad:      7.1 mmHg AV Mean Grad:  3.0 mmHg LVOT Vmax:         95.30 cm/s LVOT Vmean:        60.600 cm/s LVOT VTI:          0.251 m LVOT/AV VTI ratio: 0.81  AORTA Ao Root diam: 3.10 cm Ao Asc diam:  3.50 cm MV E velocity: 96.00 cm/s   TRICUSPID VALVE MV A velocity: 131.00 cm/s  TR Peak grad:   18.7 mmHg MV E/A ratio:  0.73         TR Vmax:        216.00 cm/s                              SHUNTS                             Systemic VTI:  0.25 m                             Systemic Diam: 1.90 cm Jodelle Red MD Electronically signed by Jodelle Red MD Signature Date/Time: 08/16/2019/10:49:52 AM    Final    Disposition   Pt is being discharged home today in improved condition.  Follow-up Plans & Appointments     Follow-up Information    [facility] Follow up on 08/29/2019.   Why: at 1030 for post pacemaker wound check Contact information: [address] [place] [address] [phone]       Regan Lemming, MD Follow up in 3 month(s).   Specialty: Cardiology Why: We will call you to set up an appointment to see Dr. Elberta Fortis in [facility] for your 3 month follow up Contact information: [address] [place] [address] [phone]              Discharge Instructions     Diet - low sodium heart healthy   Complete by: As directed    Diet Carb Modified   Complete by: As directed    Increase activity slowly   Complete by: As directed       Discharge Medications   Allergies as of 08/17/2019      Reactions   Pentosan Polysulfate Sodium    Other reaction(s): Other (See Comments) ELMIRON Y Drug hair fell out 10/21/2009 12:00:00 AM by Johny Blamer CNA 1   Elmiron [pentosan Polysulfate]    Naloxone Other (See Comments)   Hallucinations Confusion Nightmares      Medication List    TAKE these medications   amitriptyline 100 MG tablet Commonly known as: ELAVIL Take 100 mg by mouth at bedtime.   Aspirin Low Dose 81 MG EC tablet Generic drug: aspirin Take 81 mg by mouth daily.   calcium carbonate 750 MG chewable tablet Commonly known as: TUMS EX Chew 1-2 tablets by mouth as needed for heartburn.   citalopram 20 MG tablet Commonly known as: CELEXA Take 20 mg by mouth at bedtime.   diclofenac Sodium 1 % Gel Commonly known as: VOLTAREN Apply 2 g topically as needed (pain).   fluticasone 50 MCG/ACT nasal spray Commonly known as: FLONASE Place 1 spray into both nostrils as needed for allergies.   glipiZIDE 10 MG 24 hr tablet Commonly known as: GLUCOTROL XL Take 10 mg by mouth daily.   hydrOXYzine 25 MG tablet Commonly  known as: ATARAX/VISTARIL Take 1-2 tablets by mouth every evening.   ibuprofen 600 MG tablet Commonly known as: ADVIL Take 600 mg by mouth every 8 (eight) hours as needed for moderate pain.   insulin glargine (1 Unit Dial) 300 UNIT/ML Solostar Pen Commonly known as: TOUJEO Inject 15 Units into the skin daily as needed (high Glucose).   levothyroxine 125 MCG tablet Commonly known as: SYNTHROID Take 1 tablet (125 mcg total) by mouth daily. What changed:   medication strength  how much to take   losartan 25 MG tablet Commonly known as: Cozaar Take 1 tablet (25 mg total) by mouth daily.   metFORMIN 500 MG  tablet Commonly known as: GLUCOPHAGE Take 1,000 mg by mouth in the morning and at bedtime.   omeprazole 40 MG capsule Commonly known as: PRILOSEC Take 40 mg by mouth as needed (acid reflux).   ondansetron 4 MG tablet Commonly known as: ZOFRAN Take 4 mg by mouth every 8 (eight) hours as needed for nausea.   oxybutynin 10 MG 24 hr tablet Commonly known as: DITROPAN-XL Take 10 mg by mouth daily.   oxyCODONE-acetaminophen 10-325 MG tablet Commonly known as: PERCOCET Take 1 tablet by mouth every 8 (eight) hours as needed for pain.   promethazine 25 MG tablet Commonly known as: PHENERGAN Take 25 mg by mouth as needed for nausea.   rOPINIRole 2 MG tablet Commonly known as: REQUIP Take 2 mg by mouth at bedtime.          Outstanding Labs/Studies   Ionized calcium  Duration of Discharge Encounter   Greater than 30 minutes including physician time.  Signed, Theodore Demark, PA-C 08/17/2019, 10:20 AM

## 2019-08-18 LAB — CALCIUM, IONIZED: Calcium, Ionized, Serum: 4 mg/dL — ABNORMAL LOW (ref 4.5–5.6)

## 2019-08-20 ENCOUNTER — Ambulatory Visit (HOSPITAL_COMMUNITY): Admission: RE | Admit: 2019-08-20 | Payer: Medicare Other | Source: Ambulatory Visit

## 2019-08-20 MED FILL — Lidocaine HCl Local Inj 1%: INTRAMUSCULAR | Qty: 60 | Status: AC

## 2019-08-20 MED FILL — Lidocaine HCl Local Inj 1%: INTRAMUSCULAR | Qty: 20 | Status: CN

## 2019-08-22 ENCOUNTER — Telehealth: Payer: Self-pay | Admitting: Cardiology

## 2019-08-22 NOTE — Telephone Encounter (Signed)
Looks like EP provider is Dr. Elberta Fortis and not Dr. Ladona Ridgel

## 2019-08-22 NOTE — Telephone Encounter (Signed)
New Message  Patient states that she is returning call to [facility]. Please give patient a call back.

## 2019-08-22 NOTE — Telephone Encounter (Signed)
lmtcb

## 2019-08-22 NOTE — Telephone Encounter (Signed)
Barrett, Jillian Salt, PA-C  Evans Lance Please let her know that her calcium level is low. Ask her to get OTC calcium and vit D tablets, take according package directions and follow up with her PCP.  Thanks   ----Spoke to pt and gave results. Pt stated she had blood work done this morning at PCP office. I asked if she could have those results faxed to [place] and gave her our fax number. Pt verbalized understanding and thanks for the call.

## 2019-08-29 ENCOUNTER — Ambulatory Visit (INDEPENDENT_AMBULATORY_CARE_PROVIDER_SITE_OTHER): Payer: Medicare Other | Admitting: Emergency Medicine

## 2019-08-29 ENCOUNTER — Other Ambulatory Visit: Payer: Self-pay

## 2019-08-29 DIAGNOSIS — I495 Sick sinus syndrome: Secondary | ICD-10-CM | POA: Diagnosis not present

## 2019-08-30 LAB — CUP PACEART INCLINIC DEVICE CHECK
Battery Remaining Longevity: 156 mo
Brady Statistic RA Percent Paced: 46.6 %
Brady Statistic RV Percent Paced: 0.1 % — CL
Date Time Interrogation Session: 20210805170552
Implantable Lead Implant Date: 20210723
Implantable Lead Implant Date: 20210723
Implantable Lead Location: 753859
Implantable Lead Location: 753860
Implantable Lead Model: 5076
Implantable Lead Model: 5076
Implantable Pulse Generator Implant Date: 20210723
Lead Channel Impedance Value: 399 Ohm
Lead Channel Impedance Value: 399 Ohm
Lead Channel Impedance Value: 456 Ohm
Lead Channel Pacing Threshold Amplitude: 0.5 V
Lead Channel Pacing Threshold Amplitude: 0.75 V
Lead Channel Pacing Threshold Pulse Width: 0.4 ms
Lead Channel Pacing Threshold Pulse Width: 0.4 ms
Lead Channel Sensing Intrinsic Amplitude: 12.4 mV
Lead Channel Sensing Intrinsic Amplitude: 3 mV

## 2019-08-30 NOTE — Progress Notes (Signed)
Wound check appointment. Steri-strips removed. Wound without redness or edema. Incision edges approximated, wound well healed. Device checked with industry.  Normal device function. Thresholds, sensing, and impedances consistent with implant measurements. Device programmed at 3.5V for extra safety margin until 3 month visit. Histogram distribution appropriate for patient and level of activity. No mode switches or high ventricular rates noted. Patient educated about wound care, arm mobility, lifting restrictions. ROV with Dr. Elberta Fortis 12/16/19.  [name] enrolled in remote monitoring, next scheduled transmission 11/18/19.   Patient reports she continues to have issues with falling.  She had thought the pacemaker was going to improve this.  Patient reporting most recent fall was in the AM on 8/5, denies hitting head or LOC, states she had no warning she just suddenly fell striking her left arm.  Educated patient on Pacemaker function related to heart rate, recommended PCP follow-up to explore non-cardiac related reasons for fall/ ED precautions for falls with injury or LOC.  Patient requested Dr. Elberta Fortis be made aware.

## 2019-10-01 ENCOUNTER — Ambulatory Visit (HOSPITAL_COMMUNITY)
Admission: RE | Admit: 2019-10-01 | Discharge: 2019-10-01 | Disposition: A | Discharge status: Home / Self Care | Payer: Medicare Other | Source: Ambulatory Visit | Attending: Neurology | Admitting: Neurology

## 2019-10-01 ENCOUNTER — Ambulatory Visit (HOSPITAL_COMMUNITY): Payer: Medicare Other

## 2019-10-01 ENCOUNTER — Other Ambulatory Visit: Payer: Self-pay

## 2019-10-01 DIAGNOSIS — R296 Repeated falls: Secondary | ICD-10-CM | POA: Insufficient documentation

## 2019-10-01 DIAGNOSIS — S0990XS Unspecified injury of head, sequela: Secondary | ICD-10-CM | POA: Insufficient documentation

## 2019-10-01 DIAGNOSIS — M542 Cervicalgia: Secondary | ICD-10-CM | POA: Insufficient documentation

## 2019-10-01 MED ORDER — GADOBUTROL 1 MMOL/ML IV SOLN
8.0000 mL | Freq: Once | INTRAVENOUS | Status: AC | PRN
  Administered 2019-10-01: 8 mL via INTRAVENOUS

## 2019-10-01 NOTE — Progress Notes (Signed)
Informed of MRI for today.   Device system confirmed to be MRI conditional, with implant date > 6 weeks ago and no evidence of abandoned or epicardial leads in review of most recent CXR Interrogation from today reviewed, pt is currently AP-VS at 88 bpm, slowed down to ~71-78 per RN. Change device settings for MRI to DOO at 90 bpm  Tachy-therapies to off if applicable  Program device back to pre-MRI settings after completion of exam.  Graciella Freer, PA-C  10/01/2019 1:01 PM

## 2019-10-01 NOTE — Progress Notes (Signed)
Normal brain MRI-  no atrophy, bleed or ischaemic stroke, no sinusitis. Please call.

## 2019-10-02 ENCOUNTER — Telehealth: Payer: Self-pay | Admitting: Neurology

## 2019-10-02 NOTE — Telephone Encounter (Signed)
-----   Message from Melvyn Novas, MD sent at 10/01/2019  5:03 PM EDT ----- Normal brain MRI-  no atrophy, bleed or ischaemic stroke, no sinusitis. Please call.

## 2019-10-02 NOTE — Telephone Encounter (Signed)
Called the patient and there was no answer. LVM on cell #(per DPR) advising MRI was normal and nothing appeared concerning. Advised the patient to call back if she has any questions.

## 2019-10-08 ENCOUNTER — Ambulatory Visit (INDEPENDENT_AMBULATORY_CARE_PROVIDER_SITE_OTHER): Payer: Medicare Other | Admitting: Adult Health

## 2019-10-08 ENCOUNTER — Encounter: Payer: Self-pay | Admitting: Adult Health

## 2019-10-08 ENCOUNTER — Other Ambulatory Visit: Payer: Self-pay

## 2019-10-08 VITALS — BP 128/80 | HR 77 | Ht 60.0 in | Wt 214.8 lb

## 2019-10-08 DIAGNOSIS — F40298 Other specified phobia: Secondary | ICD-10-CM | POA: Diagnosis not present

## 2019-10-08 DIAGNOSIS — W19XXXD Unspecified fall, subsequent encounter: Secondary | ICD-10-CM

## 2019-10-08 DIAGNOSIS — G629 Polyneuropathy, unspecified: Secondary | ICD-10-CM

## 2019-10-08 NOTE — Progress Notes (Signed)
PATIENT: Jillian Leblanc DOB: [birth year]  REASON FOR VISIT: follow up HISTORY FROM: patient  HISTORY OF PRESENT ILLNESS: Today 10/08/19:  Ms. Laprise is a 74 year old female with a history of frequent falls.  She returns today for follow-up.  At her visit with Dr. Vickey Huger nerve conduction studies was ordered that showed a mild axonal sensorimotor polyneuropathy.  The patient is diabetic her last hemoglobin A1c was 7.6.  She denies any discomfort in the feet.  MRI of the brain was relatively unremarkable.  She states that since her visit with Dr. Vickey Huger she has had a pacemaker placed.  Cardiology felt that her falls may be from a cardiac origin.  The patient states that since she has had her pacemaker she is only had 3 falls.  She states that she has not had any falls in the last 2 to 3 weeks.  She reports that she continues to have a fear of falling which she feels makes her even more unsteady.  She does not use a cane or walker.  She also noted trouble with her speech prior to getting the pacemaker.  She states that this has improved since getting the pacemaker but not resolved.  She returns today for an evaluation.    REVIEW OF SYSTEMS: Out of a complete 14 system review of symptoms, the patient complains only of the following symptoms, and all other reviewed systems are negative.  See HPI  ALLERGIES: Allergies  Allergen Reactions  . Pentosan Polysulfate Sodium     Other reaction(s): Other (See Comments) ELMIRON Y Drug hair fell out 10/21/2009 12:00:00 AM by Johny Blamer CNA 1  . Elmiron [Pentosan Polysulfate]   . Naloxone Other (See Comments)    Hallucinations Confusion Nightmares    HOME MEDICATIONS: Outpatient Medications Prior to Visit  Medication Sig Dispense Refill  . amitriptyline (ELAVIL) 100 MG tablet Take 100 mg by mouth at bedtime.    . ASPIRIN LOW DOSE 81 MG EC tablet Take 81 mg by mouth daily.    . calcium carbonate (TUMS EX) 750 MG chewable tablet Chew 1-2  tablets by mouth as needed for heartburn.     . citalopram (CELEXA) 20 MG tablet Take 20 mg by mouth at bedtime.    . diclofenac Sodium (VOLTAREN) 1 % GEL Apply 2 g topically as needed (pain).     . fluticasone (FLONASE) 50 MCG/ACT nasal spray Place 1 spray into both nostrils as needed for allergies.     Marland Kitchen glipiZIDE (GLUCOTROL XL) 10 MG 24 hr tablet Take 10 mg by mouth daily.    . hydrOXYzine (ATARAX/VISTARIL) 25 MG tablet Take 1-2 tablets by mouth every evening.    Marland Kitchen ibuprofen (ADVIL) 600 MG tablet Take 600 mg by mouth every 8 (eight) hours as needed for moderate pain.     Marland Kitchen insulin glargine, 1 Unit Dial, (TOUJEO) 300 UNIT/ML Solostar Pen Inject 15 Units into the skin daily as needed (high Glucose).     Marland Kitchen levothyroxine (SYNTHROID) 125 MCG tablet Take 1 tablet (125 mcg total) by mouth daily. 30 tablet 1  . losartan (COZAAR) 25 MG tablet Take 1 tablet (25 mg total) by mouth daily. 30 tablet 6  . metFORMIN (GLUCOPHAGE) 500 MG tablet Take 1,000 mg by mouth in the morning and at bedtime.    Marland Kitchen omeprazole (PRILOSEC) 40 MG capsule Take 40 mg by mouth as needed (acid reflux).     . ondansetron (ZOFRAN) 4 MG tablet Take 4 mg by mouth every 8 (eight)  hours as needed for nausea.    Marland Kitchen oxybutynin (DITROPAN-XL) 10 MG 24 hr tablet Take 10 mg by mouth daily.    Marland Kitchen oxyCODONE-acetaminophen (PERCOCET) 10-325 MG per tablet Take 1 tablet by mouth every 8 (eight) hours as needed for pain.     . promethazine (PHENERGAN) 25 MG tablet Take 25 mg by mouth as needed for nausea.     Marland Kitchen rOPINIRole (REQUIP) 2 MG tablet Take 2 mg by mouth at bedtime.      No facility-administered medications prior to visit.    PAST MEDICAL HISTORY: Past Medical History:  Diagnosis Date  . Diabetes mellitus without complication (HCC)   . Interstitial cystitis   . Thyroid disease     PAST SURGICAL HISTORY: Past Surgical History:  Procedure Laterality Date  . ABDOMINAL HYSTERECTOMY    . CHOLECYSTECTOMY    . NECK SURGERY    .  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 SHOULDER SURGERY    . THYROIDECTOMY      FAMILY HISTORY: No family history on file.  SOCIAL HISTORY: Social History   Socioeconomic History  . Marital status: Married    Spouse name: Not on file  . Number of children: Not on file  . Years of education: Not on file  . Highest education level: Not on file  Occupational History  . Not on file  Tobacco Use  . Smoking status: Never Smoker  . Smokeless tobacco: Never Used  Substance and Sexual Activity  . Alcohol use: No  . Drug use: Never  . Sexual activity: Not on file  Other Topics Concern  . Not on file  Social History Narrative  . Not on file   Social Determinants of Health   Financial Resource Strain:   . Difficulty of Paying Living Expenses: Not on file  Food Insecurity:   . Worried About Programme researcher, broadcasting/film/video in the Last Year: Not on file  . Ran Out of Food in the Last Year: Not on file  Transportation Needs:   . Lack of Transportation (Medical): Not on file  . Lack of Transportation (Non-Medical): Not on file  Physical Activity:   . Days of Exercise per Week: Not on file  . Minutes of Exercise per Session: Not on file  Stress:   . Feeling of Stress : Not on file  Social Connections:   . Frequency of Communication with Friends and Family: Not on file  . Frequency of Social Gatherings with Friends and Family: Not on file  . Attends Religious Services: Not on file  . Active Member of Clubs or Organizations: Not on file  . Attends Banker Meetings: Not on file  . Marital Status: Not on file  Intimate Partner Violence:   . Fear of Current or Ex-Partner: Not on file  . Emotionally Abused: Not on file  . Physically Abused: Not on file  . Sexually Abused: Not on file      PHYSICAL EXAM  Vitals:   10/08/19 1423  BP:  128/80  Pulse: 77  Weight: 214 lb 12.8 oz (97.4 kg)  Height: 5' (1.524 m)   Body mass index is 41.95 kg/m.  Generalized: Well developed, in no acute distress   Neurological examination  Mentation: Alert oriented to time, place, history taking. Follows all commands speech and language fluent Cranial nerve II-XII: Pupils were equal  round reactive to light. Extraocular movements were full, visual field were full on confrontational test. . Head turning and shoulder shrug  were normal and symmetric. Motor: The motor testing reveals 5 over 5 strength of all 4 extremities. Good symmetric motor tone is noted throughout.  Sensory: Sensory testing is intact to soft touch on all 4 extremities. No evidence of extinction is noted.  Coordination: Cerebellar testing reveals good finger-nose-finger and heel-to-shin bilaterally.  Gait and station: Patient has good stride and arm swing.  Able to stand without assistance. Reflexes: Deep tendon reflexes are symmetric and normal bilaterally.   DIAGNOSTIC DATA (LABS, IMAGING, TESTING) - I reviewed patient records, labs, notes, testing and imaging myself where available.  Lab Results  Component Value Date   WBC 5.6 08/15/2019   HGB 10.4 (L) 08/15/2019   HCT 33.0 (L) 08/15/2019   MCV 84.4 08/15/2019   PLT 285 08/15/2019      Component Value Date/Time   NA 136 08/16/2019 0448   K 3.8 08/16/2019 0448   CL 102 08/16/2019 0448   CO2 25 08/16/2019 0448   GLUCOSE 143 (H) 08/16/2019 0448   BUN 11 08/16/2019 0448   CREATININE 1.13 (H) 08/16/2019 0448   CALCIUM 7.0 (L) 08/16/2019 0448   PROT 6.5 08/16/2019 0448   ALBUMIN 3.2 (L) 08/16/2019 0448   AST 18 08/16/2019 0448   ALT 13 08/16/2019 0448   ALKPHOS 52 08/16/2019 0448   BILITOT 0.3 08/16/2019 0448   GFRNONAA 49 (L) 08/16/2019 0448   GFRAA 56 (L) 08/16/2019 0448    Lab Results  Component Value Date   HGBA1C 7.6 (H) 08/16/2019   Lab Results  Component Value Date   TSH 11.133 (H) 08/16/2019        ASSESSMENT AND PLAN 74 y.o. year old female  has a past medical history of Diabetes mellitus without complication (HCC), Interstitial cystitis, and Thyroid disease. here with:  1.  Falls   Improvement since pacemaker  Still has a fear of following-referred to physical therapy   2.  Mild axonal sensorimotor polyneuropathy   Referral to physical therapy  Encouraged to keep diabetes under good control  Follow-up in 6 months or sooner if needed  I spent 20 minutes of face-to-face and non-face-to-face time with patient.  This included previsit chart review, lab review, study review, order entry, electronic health record documentation, patient education.  Butch Penny, MSN, NP-C 10/08/2019, 2:44 PM [facility] [address] [place], [place] [address] [phone]

## 2019-10-08 NOTE — Patient Instructions (Signed)
Your Plan:  PT ordered PCP in [place]: [phone]   Thank you for coming to see [place] at [facility]. I hope we have been able to provide you high quality care today.  You may receive a patient satisfaction survey over the next few weeks. We would appreciate your feedback and comments so that we may continue to improve ourselves and the health of our patients.

## 2019-10-18 ENCOUNTER — Other Ambulatory Visit: Payer: Self-pay

## 2019-10-18 MED ORDER — LEVOTHYROXINE SODIUM 125 MCG PO TABS: 125.0000 ug | ORAL_TABLET | Freq: Every day | ORAL | 1 refills | Status: DC

## 2019-10-24 ENCOUNTER — Ambulatory Visit: Payer: Medicare Other | Admitting: Physical Therapy

## 2019-10-25 ENCOUNTER — Telehealth: Payer: Self-pay | Admitting: Cardiology

## 2019-10-25 NOTE — Telephone Encounter (Signed)
Pt c/o swelling: STAT is pt has developed SOB within 24 hours  1) How much weight have you gained and in what time span? Believes she has gained some weight, but is not sure  2) If swelling, where is the swelling located? Feet and legs  3) Are you currently taking a fluid pill? no  4) Are you currently SOB? no  5) Do you have a log of your daily weights (if so, list)? no  6) Have you gained 3 pounds in a day or 5 pounds in a week? no  7) Have you traveled recently? No   Patient states she recently had a pacemaker put in and is now having swelling in her feet and legs. She states she has never had this problem before and is not sure if it is related to the pacemaker. She states she is also having some splotches on her legs.

## 2019-10-25 NOTE — Telephone Encounter (Signed)
Informed that reported issues are not related to PPM, advised to call PCP to further discuss and be evaluated. Patient verbalized understanding and agreeable to plan.

## 2019-11-18 ENCOUNTER — Ambulatory Visit (INDEPENDENT_AMBULATORY_CARE_PROVIDER_SITE_OTHER): Payer: Medicare Other

## 2019-11-18 DIAGNOSIS — I455 Other specified heart block: Secondary | ICD-10-CM

## 2019-11-18 LAB — CUP PACEART REMOTE DEVICE CHECK
Battery Remaining Longevity: 152 mo
Battery Voltage: 3.2 V
Brady Statistic AP VP Percent: 0.01 %
Brady Statistic AP VS Percent: 32.43 %
Brady Statistic AS VP Percent: 0.02 %
Brady Statistic AS VS Percent: 67.54 %
Brady Statistic RA Percent Paced: 32.27 %
Brady Statistic RV Percent Paced: 0.03 %
Date Time Interrogation Session: 20211025044821
Implantable Lead Implant Date: 20210723
Implantable Lead Implant Date: 20210723
Implantable Lead Location: 753859
Implantable Lead Location: 753860
Implantable Lead Model: 5076
Implantable Lead Model: 5076
Implantable Pulse Generator Implant Date: 20210723
Lead Channel Impedance Value: 323 Ohm
Lead Channel Impedance Value: 361 Ohm
Lead Channel Impedance Value: 361 Ohm
Lead Channel Impedance Value: 494 Ohm
Lead Channel Pacing Threshold Amplitude: 0.5 V
Lead Channel Pacing Threshold Amplitude: 0.625 V
Lead Channel Pacing Threshold Pulse Width: 0.4 ms
Lead Channel Pacing Threshold Pulse Width: 0.4 ms
Lead Channel Sensing Intrinsic Amplitude: 11.625 mV
Lead Channel Sensing Intrinsic Amplitude: 11.625 mV
Lead Channel Sensing Intrinsic Amplitude: 2.875 mV
Lead Channel Sensing Intrinsic Amplitude: 2.875 mV
Lead Channel Setting Pacing Amplitude: 3 V
Lead Channel Setting Pacing Amplitude: 3 V
Lead Channel Setting Pacing Pulse Width: 0.4 ms
Lead Channel Setting Sensing Sensitivity: 0.9 mV

## 2019-11-21 NOTE — Progress Notes (Signed)
Remote pacemaker transmission.   

## 2019-12-16 ENCOUNTER — Encounter: Payer: Medicare Other | Admitting: Cardiology

## 2019-12-24 ENCOUNTER — Encounter: Payer: Self-pay | Admitting: Cardiology

## 2019-12-24 ENCOUNTER — Ambulatory Visit (INDEPENDENT_AMBULATORY_CARE_PROVIDER_SITE_OTHER): Payer: Medicare Other | Admitting: Cardiology

## 2019-12-24 ENCOUNTER — Other Ambulatory Visit: Payer: Self-pay

## 2019-12-24 VITALS — BP 130/72 | HR 69 | Ht 60.0 in | Wt 220.0 lb

## 2019-12-24 DIAGNOSIS — I495 Sick sinus syndrome: Secondary | ICD-10-CM

## 2019-12-24 NOTE — Progress Notes (Signed)
Electrophysiology Office Note   Date:  12/27/2019   ID:  Jen Benedict, DOB Aug 13, 1945, MRN [id number]  PCP:  Karle Plumber, MD  Cardiologist:  [facility] Primary Electrophysiologist:  Omeka Holben Jorja Loa, MD    Chief Complaint: sinus pauses   History of Present Illness: Jillian Leblanc is a 74 y.o. female who is being seen today for the evaluation of sinus pauses, pacemaker at the request of Arvind, Idelia Salm, MD. Presenting today for electrophysiology evaluation.  She has a history of diabetes and thyroid disease.  She was hospitalized July 2021 with she had been having frequent falls and was found to have sinus pauses.  She is now status post Medtronic dual-chamber pacemaker implanted 08/17/2019.  Today, she denies symptoms of palpitations, chest pain, shortness of breath, orthopnea, PND, lower extremity edema, claudication, dizziness, presyncope, syncope, bleeding, or neurologic sequela. The patient is tolerating medications without difficulties.  She currently feels well.  She has no chest pain or shortness of breath.  She is able to do all of her daily activities without restriction.  She has not had any further episodes similar to her falls during her hospitalization.   Past Medical History:  Diagnosis Date  . Diabetes mellitus without complication (HCC)   . Interstitial cystitis   . Thyroid disease    Past Surgical History:  Procedure Laterality Date  . ABDOMINAL HYSTERECTOMY    . CHOLECYSTECTOMY    . NECK SURGERY    . 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 SHOULDER SURGERY    . THYROIDECTOMY       Current Outpatient Medications  Medication Sig Dispense Refill  . amitriptyline (ELAVIL) 100 MG tablet Take 100 mg by mouth at bedtime.    . ASPIRIN LOW DOSE 81 MG EC tablet Take 81 mg by mouth daily.    . calcium carbonate (TUMS EX)  750 MG chewable tablet Chew 1-2 tablets by mouth as needed for heartburn.     . citalopram (CELEXA) 20 MG tablet Take 20 mg by mouth at bedtime.    . diclofenac Sodium (VOLTAREN) 1 % GEL Apply 2 g topically as needed (pain).     . fluticasone (FLONASE) 50 MCG/ACT nasal spray Place 1 spray into both nostrils as needed for allergies.     Marland Kitchen glipiZIDE (GLUCOTROL XL) 10 MG 24 hr tablet Take 10 mg by mouth daily.    . hydrOXYzine (ATARAX/VISTARIL) 25 MG tablet Take 1-2 tablets by mouth every evening.    Marland Kitchen ibuprofen (ADVIL) 600 MG tablet Take 600 mg by mouth every 8 (eight) hours as needed for moderate pain.     Marland Kitchen insulin glargine, 1 Unit Dial, (TOUJEO) 300 UNIT/ML Solostar Pen Inject 15 Units into the skin daily as needed (high Glucose).     Marland Kitchen levothyroxine (SYNTHROID) 125 MCG tablet Take 1 tablet (125 mcg total) by mouth daily. 30 tablet 1  . losartan (COZAAR) 25 MG tablet Take 1 tablet (25 mg total) by mouth daily. 30 tablet 6  . metFORMIN (GLUCOPHAGE) 500 MG tablet Take 1,000 mg by mouth in the morning and at bedtime.    Marland Kitchen omeprazole (PRILOSEC) 40 MG capsule Take 40 mg by mouth as needed (acid reflux).     . ondansetron (ZOFRAN) 4 MG tablet Take 4 mg by mouth every 8 (eight) hours as needed for nausea.    Marland Kitchen oxybutynin (DITROPAN-XL)  10 MG 24 hr tablet Take 10 mg by mouth daily.    Marland Kitchen oxyCODONE-acetaminophen (PERCOCET) 10-325 MG per tablet Take 1 tablet by mouth every 8 (eight) hours as needed for pain.     . promethazine (PHENERGAN) 25 MG tablet Take 25 mg by mouth as needed for nausea.     Marland Kitchen rOPINIRole (REQUIP) 2 MG tablet Take 2 mg by mouth at bedtime.      No current facility-administered medications for this visit.    Allergies:   Pentosan polysulfate sodium, Elmiron [pentosan polysulfate], and Naloxone   Social History:  The patient  reports that she has never smoked. She has never used smokeless tobacco. She reports that she does not drink alcohol and does not use drugs.   Family History:   The patient's family history is not on file.    ROS:  Please see the history of present illness.   Otherwise, review of systems is positive for none.   All other systems are reviewed and negative.    PHYSICAL EXAM: VS:  BP 130/72   Pulse 69   Ht 5' (1.524 m)   Wt 220 lb (99.8 kg)   SpO2 98%   BMI 42.97 kg/m  , BMI Body mass index is 42.97 kg/m. GEN: Well nourished, well developed, in no acute distress  HEENT: normal  Neck: no JVD, carotid bruits, or masses Cardiac: RRR; no murmurs, rubs, or gallops,no edema  Respiratory:  clear to auscultation bilaterally, normal work of breathing GI: soft, nontender, nondistended, + BS MS: no deformity or atrophy  Skin: warm and dry, device pocket is well healed Neuro:  Strength and sensation are intact Psych: euthymic mood, full affect  EKG:  EKG is ordered today. Personal review of the ekg ordered shows sinus rhythm, rate 69  Device interrogation is reviewed today in detail.  See PaceArt for details.   Recent Labs: 06/22/2019: Magnesium 1.9 08/15/2019: Hemoglobin 10.4; Platelets 285 08/16/2019: ALT 13; BUN 11; Creatinine, Ser 1.13; Potassium 3.8; Sodium 136; TSH 11.133    Lipid Panel  No results found for: CHOL, TRIG, HDL, CHOLHDL, VLDL, LDLCALC, LDLDIRECT   Wt Readings from Last 3 Encounters:  12/24/19 220 lb (99.8 kg)  10/08/19 214 lb 12.8 oz (97.4 kg)  08/17/19 119 lb 0.8 oz (54 kg)      Other studies Reviewed: Additional studies/ records that were reviewed today include: TTE 08/16/2019 Review of the above records today demonstrates:  1. Left ventricular ejection fraction, by estimation, is 55 to 60%. The  left ventricle has normal function. The left ventricle has no regional  wall motion abnormalities. There is mild concentric left ventricular  hypertrophy. Left ventricular diastolic  parameters are consistent with Grade I diastolic dysfunction (impaired  relaxation).  2. Right ventricular systolic function is normal.  The right ventricular  size is normal. There is normal pulmonary artery systolic pressure.  3. Left atrial size was mild to moderately dilated.  4. Right atrial size was mildly dilated.  5. The mitral valve is normal in structure. Trivial mitral valve  regurgitation. No evidence of mitral stenosis.  6. The aortic valve is tricuspid. Aortic valve regurgitation is not  visualized. Mild aortic valve sclerosis is present, with no evidence of  aortic valve stenosis.    ASSESSMENT AND PLAN:  1.  Sick sinus syndrome: Status post Medtronic dual-chamber pacemaker.  Device functioning appropriately.  No changes at this time.  2.  Diabetes: Last A1c is 7.6.  Plan per primary care.  Current medicines are reviewed at length with the patient today.   The patient does not have concerns regarding her medicines.  The following changes were made today:  none  Labs/ tests ordered today include:  Orders Placed This Encounter  Procedures  . Pacemaker external - parameters  . EKG 12-Lead     Disposition:   FU with Liahna Brickner 9 months  Signed, Priscille Shadduck Jorja Loa, MD  12/27/2019 3:00 PM     [facility] [address] [place] [address] [phone] (office) [phone] (fax)

## 2019-12-27 ENCOUNTER — Encounter: Payer: Self-pay | Admitting: Cardiology

## 2020-01-10 ENCOUNTER — Other Ambulatory Visit: Payer: Self-pay

## 2020-01-10 ENCOUNTER — Emergency Department (HOSPITAL_BASED_OUTPATIENT_CLINIC_OR_DEPARTMENT_OTHER): Payer: Medicare Other

## 2020-01-10 ENCOUNTER — Emergency Department (HOSPITAL_BASED_OUTPATIENT_CLINIC_OR_DEPARTMENT_OTHER)
Admission: EM | Admit: 2020-01-10 | Discharge: 2020-01-10 | Disposition: A | Discharge status: Home / Self Care | Payer: Medicare Other | Attending: Emergency Medicine | Admitting: Emergency Medicine

## 2020-01-10 DIAGNOSIS — D649 Anemia, unspecified: Secondary | ICD-10-CM | POA: Insufficient documentation

## 2020-01-10 DIAGNOSIS — J029 Acute pharyngitis, unspecified: Secondary | ICD-10-CM | POA: Diagnosis not present

## 2020-01-10 DIAGNOSIS — R0602 Shortness of breath: Secondary | ICD-10-CM | POA: Insufficient documentation

## 2020-01-10 DIAGNOSIS — Z7984 Long term (current) use of oral hypoglycemic drugs: Secondary | ICD-10-CM | POA: Diagnosis not present

## 2020-01-10 DIAGNOSIS — R197 Diarrhea, unspecified: Secondary | ICD-10-CM | POA: Insufficient documentation

## 2020-01-10 DIAGNOSIS — R059 Cough, unspecified: Secondary | ICD-10-CM | POA: Insufficient documentation

## 2020-01-10 DIAGNOSIS — Z20822 Contact with and (suspected) exposure to covid-19: Secondary | ICD-10-CM | POA: Insufficient documentation

## 2020-01-10 DIAGNOSIS — R519 Headache, unspecified: Secondary | ICD-10-CM | POA: Diagnosis not present

## 2020-01-10 DIAGNOSIS — R110 Nausea: Secondary | ICD-10-CM | POA: Insufficient documentation

## 2020-01-10 DIAGNOSIS — Z7982 Long term (current) use of aspirin: Secondary | ICD-10-CM | POA: Diagnosis not present

## 2020-01-10 DIAGNOSIS — R5383 Other fatigue: Secondary | ICD-10-CM | POA: Diagnosis present

## 2020-01-10 DIAGNOSIS — E119 Type 2 diabetes mellitus without complications: Secondary | ICD-10-CM | POA: Insufficient documentation

## 2020-01-10 LAB — CBC WITH DIFFERENTIAL/PLATELET
Abs Immature Granulocytes: 0.01 10*3/uL (ref 0.00–0.07)
Basophils Absolute: 0 10*3/uL (ref 0.0–0.1)
Basophils Relative: 0 %
Eosinophils Absolute: 0.2 10*3/uL (ref 0.0–0.5)
Eosinophils Relative: 3 %
HCT: 27.9 % — ABNORMAL LOW (ref 36.0–46.0)
Hemoglobin: 8.6 g/dL — ABNORMAL LOW (ref 12.0–15.0)
Immature Granulocytes: 0 %
Lymphocytes Relative: 24 %
Lymphs Abs: 1.8 10*3/uL (ref 0.7–4.0)
MCH: 22.1 pg — ABNORMAL LOW (ref 26.0–34.0)
MCHC: 30.8 g/dL (ref 30.0–36.0)
MCV: 71.7 fL — ABNORMAL LOW (ref 80.0–100.0)
Monocytes Absolute: 0.5 10*3/uL (ref 0.1–1.0)
Monocytes Relative: 6 %
Neutro Abs: 5.2 10*3/uL (ref 1.7–7.7)
Neutrophils Relative %: 67 %
Platelets: 270 10*3/uL (ref 150–400)
RBC: 3.89 MIL/uL (ref 3.87–5.11)
RDW: 17.5 % — ABNORMAL HIGH (ref 11.5–15.5)
WBC: 7.7 10*3/uL (ref 4.0–10.5)
nRBC: 0 % (ref 0.0–0.2)

## 2020-01-10 LAB — URINALYSIS, ROUTINE W REFLEX MICROSCOPIC
Bilirubin Urine: NEGATIVE
Glucose, UA: NEGATIVE mg/dL
Hgb urine dipstick: NEGATIVE
Ketones, ur: NEGATIVE mg/dL
Leukocytes,Ua: NEGATIVE
Nitrite: NEGATIVE
Protein, ur: NEGATIVE mg/dL
Specific Gravity, Urine: <1.005 — ABNORMAL LOW (ref 1.005–1.030)
pH: 7.5 (ref 5.0–8.0)

## 2020-01-10 LAB — TROPONIN I (HIGH SENSITIVITY)
Troponin I (High Sensitivity): 75 ng/L — ABNORMAL HIGH (ref <18)
Troponin I (High Sensitivity): 72 ng/L — ABNORMAL HIGH (ref <18)

## 2020-01-10 LAB — BASIC METABOLIC PANEL
Anion gap: 10 (ref 5–15)
BUN: 18 mg/dL (ref 8–23)
CO2: 23 mmol/L (ref 22–32)
Calcium: 7.8 mg/dL — ABNORMAL LOW (ref 8.9–10.3)
Chloride: 100 mmol/L (ref 98–111)
Creatinine, Ser: 1.15 mg/dL — ABNORMAL HIGH (ref 0.44–1.00)
GFR, Estimated: 51 mL/min — ABNORMAL LOW (ref >60)
Glucose, Bld: 92 mg/dL (ref 70–99)
Potassium: 3.7 mmol/L (ref 3.5–5.1)
Sodium: 133 mmol/L — ABNORMAL LOW (ref 135–145)

## 2020-01-10 LAB — RESP PANEL BY RT-PCR (FLU A&B, COVID) ARPGX2
Influenza A by PCR: NEGATIVE
Influenza B by PCR: NEGATIVE
SARS Coronavirus 2 by RT PCR: NEGATIVE

## 2020-01-10 LAB — BRAIN NATRIURETIC PEPTIDE: B Natriuretic Peptide: 68.8 pg/mL (ref 0.0–100.0)

## 2020-01-10 LAB — TSH: TSH: 9.338 u[IU]/mL — ABNORMAL HIGH (ref 0.350–4.500)

## 2020-01-10 MED ORDER — KETOROLAC TROMETHAMINE 15 MG/ML IJ SOLN
15.0000 mg | Freq: Once | INTRAMUSCULAR | Status: AC
  Administered 2020-01-10: 17:00:00 15 mg via INTRAVENOUS
  Filled 2020-01-10: qty 1

## 2020-01-10 MED ORDER — SODIUM CHLORIDE 0.9 % IV BOLUS
500.0000 mL | Freq: Once | INTRAVENOUS | Status: AC
  Administered 2020-01-10: 17:00:00 500 mL via INTRAVENOUS

## 2020-01-10 MED ORDER — SENNOSIDES-DOCUSATE SODIUM 8.6-50 MG PO TABS: 1.0000 | ORAL_TABLET | Freq: Every day | ORAL | 0 refills | Status: AC

## 2020-01-10 MED ORDER — FERROUS SULFATE 325 (65 FE) MG PO TABS: 325.0000 mg | ORAL_TABLET | Freq: Every day | ORAL | 0 refills | Status: DC

## 2020-01-10 MED ORDER — CALCIUM CARBONATE ANTACID 500 MG PO CHEW: 1.0000 | CHEWABLE_TABLET | Freq: Every day | ORAL | 0 refills | Status: AC

## 2020-01-10 NOTE — Discharge Instructions (Addendum)
Please follow-up with your doctor in the office this week.  Bring these papers with you.  Your blood test today showed that your calcium level was somewhat low, and you also had some anemia with a hemoglobin of 8.8.  I started you on calcium and iron pills, as well as stool softeners (since iron can constipate you).  You should follow up with a GI doctor for a possible endoscopy/colonoscopy to rule out GI bleed.  I recommend that your doctor recheck your hemoglobin level and also your thyroid levels (which we were not able to get results on in the ER) as an outpatient in 1 week.  I suspect you may be having a viral syndrome.  Your Covid and flu test were negative.  I do expect that you will improve over the next several days.  However, if you begin experiencing new or worsening chest pain, sudden dizziness, feel like passing out, or have difficulty breathing, you should return to the emergency department immediately.

## 2020-01-10 NOTE — ED Triage Notes (Signed)
Pt states she has been coughing, sore throat and fatigued for 3 days. States also been urinary incontinence starting today which is new.

## 2020-01-10 NOTE — ED Notes (Signed)
Patient reports cough, body aches, fatigue and she loses control of her bladder while coughing, denies any past urinary incontinence

## 2020-01-10 NOTE — ED Provider Notes (Signed)
[facility] EMERGENCY DEPARTMENT Provider Note   CSN: [id number] Arrival date & time: 01/10/20  1325     History Chief Complaint  Patient presents with  . Cough    & urinary incontinence    Jillian Leblanc is a 74 y.o. female with a history of interstitial cystitis with bladder stimulator, diabetes on p.o. medications and occasional insulin, thyroid disorder, presenting to the emergency department constellation of symptoms.  Patient reports approximately 1 week, she has had cough, fatigue, headache, sore throat, myalgias, diarrhea, nausea.  She says she feels very weak.  The headache comes and goes and is currently minimal.  She feels that she has not been eating enough at home.  She is trying to drink keep her self hydrated.  She reports urinary incontinence, worse with her coughing.  She also reports of left shoulder pain that she thinks may be related to her persistent coughing.  There are no sick contacts in the house.  She has received the flu vaccine as well as 2 doses of the Covid vaccine several months ago.  She reports had a pacemaker placed for a slow heart rate this year.  She has no history of MI or cardiac stents.  She does not smoke.  HPI     Past Medical History:  Diagnosis Date  . Diabetes mellitus without complication (HCC)   . Interstitial cystitis   . Thyroid disease     Patient Active Problem List   Diagnosis Date Noted  . Recurrent falls 08/16/2019  . 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 Head injury 06/26/2019  . Frequent falls 06/26/2019    Past Surgical History:  Procedure Laterality Date  . ABDOMINAL HYSTERECTOMY    . CHOLECYSTECTOMY    . NECK SURGERY    . PACEMAKER IMPLANT N/A 08/16/2019   Procedure: PACEMAKER IMPLANT;  Surgeon: Regan Lemmingamnitz, Will Martin, MD;  Location: [facility];  Service: Cardiovascular;  Laterality: N/A;  . SHOULDER SURGERY     . THYROIDECTOMY       OB History   No obstetric history on file.     No family history on file.  Social History   Tobacco Use  . Smoking status: Never Smoker  . Smokeless tobacco: Never Used  Substance Use Topics  . Alcohol use: No  . Drug use: Never    Home Medications Prior to Admission medications   Medication Sig Start Date End Date Taking? Authorizing Provider  amitriptyline (ELAVIL) 100 MG tablet Take 100 mg by mouth at bedtime. 08/01/19   [provider]  ASPIRIN LOW DOSE 81 MG EC tablet Take 81 mg by mouth daily. 06/16/19   [provider]  calcium carbonate (TUMS EX) 750 MG chewable tablet Chew 1-2 tablets by mouth as needed for heartburn.  04/10/11   [provider]  calcium carbonate (TUMS) 500 MG chewable tablet Chew 1 tablet (200 mg of elemental calcium total) by mouth daily for 30 doses. 01/10/20 02/09/20  Terald Sleeperrifan, Nayib Remer J, MD  citalopram (CELEXA) 20 MG tablet Take 20 mg by mouth at bedtime. 10/03/14   [provider]  diclofenac Sodium (VOLTAREN) 1 % GEL Apply 2 g topically as needed (pain).  07/23/16   [provider]  ferrous sulfate 325 (65 FE) MG tablet Take 1 tablet (325 mg total) by mouth daily. 01/10/20 03/10/20  Terald Sleeperrifan, Shakema Surita J, MD  fluticasone (FLONASE) 50 MCG/ACT nasal spray Place 1  spray into both nostrils as needed for allergies.  06/09/17   [provider]  glipiZIDE (GLUCOTROL XL) 10 MG 24 hr tablet Take 10 mg by mouth daily. 07/24/19   [provider]  hydrOXYzine (ATARAX/VISTARIL) 25 MG tablet Take 1-2 tablets by mouth every evening. 06/14/19   [provider]  ibuprofen (ADVIL) 600 MG tablet Take 600 mg by mouth every 8 (eight) hours as needed for moderate pain.  06/11/19   [provider]  insulin glargine, 1 Unit Dial, (TOUJEO) 300 UNIT/ML Solostar Pen Inject 15 Units into the skin daily as needed (high Glucose).     [provider]  levothyroxine (SYNTHROID) 125 MCG  tablet Take 1 tablet (125 mcg total) by mouth daily. 10/18/19   Barrett, Joline Salt, PA-C  losartan (COZAAR) 25 MG tablet Take 1 tablet (25 mg total) by mouth daily. 08/17/19   Barrett, Joline Salt, PA-C  metFORMIN (GLUCOPHAGE) 500 MG tablet Take 1,000 mg by mouth in the morning and at bedtime.    [provider]  omeprazole (PRILOSEC) 40 MG capsule Take 40 mg by mouth as needed (acid reflux).  03/17/18   [provider]  ondansetron (ZOFRAN) 4 MG tablet Take 4 mg by mouth every 8 (eight) hours as needed for nausea.    [provider]  oxybutynin (DITROPAN-XL) 10 MG 24 hr tablet Take 10 mg by mouth daily. 06/11/19   [provider]  oxyCODONE-acetaminophen (PERCOCET) 10-325 MG per tablet Take 1 tablet by mouth every 8 (eight) hours as needed for pain.     [provider]  promethazine (PHENERGAN) 25 MG tablet Take 25 mg by mouth as needed for nausea.  09/28/16   [provider]  rOPINIRole (REQUIP) 2 MG tablet Take 2 mg by mouth at bedtime.  06/08/19   [provider]  senna-docusate (SENOKOT-S) 8.6-50 MG tablet Take 1 tablet by mouth daily. Take with iron to prevent constipation 01/10/20 03/10/20  Terald Sleeper, MD    Allergies    Pentosan polysulfate sodium, Elmiron [pentosan polysulfate], and Naloxone  Review of Systems   Review of Systems  Constitutional: Positive for appetite change, chills and fatigue. Negative for fever.  HENT: Positive for congestion and sore throat.   Eyes: Negative for photophobia and visual disturbance.  Respiratory: Positive for cough and shortness of breath.   Cardiovascular: Negative for chest pain and palpitations.  Gastrointestinal: Positive for abdominal pain, diarrhea and nausea. Negative for vomiting.  Endocrine: Positive for polyuria.  Genitourinary: Positive for dysuria.  Musculoskeletal: Positive for arthralgias, back pain and myalgias.  Skin: Negative for rash and wound.  Neurological: Positive  for light-headedness and headaches. Negative for syncope.  Psychiatric/Behavioral: Negative for agitation and confusion.  All other systems reviewed and are negative.   Physical Exam Updated Vital Signs BP (!) 147/65 (BP Location: Right Arm)   Pulse (!) 38   Temp 98.4 F (36.9 C) (Oral)   Resp 19   Ht 5' (1.524 m)   Wt 99.3 kg   SpO2 99%   BMI 42.77 kg/m   Physical Exam Vitals and nursing note reviewed.  Constitutional:      General: She is not in acute distress.    Appearance: She is well-developed and well-nourished. She is obese.  HENT:     Head: Normocephalic and atraumatic.  Eyes:     Conjunctiva/sclera: Conjunctivae normal.  Cardiovascular:     Rate and Rhythm: Normal rate and regular rhythm.     Pulses: Normal  pulses.  Pulmonary:     Effort: Pulmonary effort is normal. No respiratory distress.     Breath sounds: Normal breath sounds.  Abdominal:     General: There is no distension.     Palpations: Abdomen is soft.     Tenderness: There is no abdominal tenderness. There is no guarding.  Musculoskeletal:        General: No edema.     Cervical back: Neck supple.  Skin:    General: Skin is warm and dry.  Neurological:     General: No focal deficit present.     Mental Status: She is alert and oriented to person, place, and time.  Psychiatric:        Mood and Affect: Mood and affect and mood normal.        Behavior: Behavior normal.     ED Results / Procedures / Treatments   Labs (all labs ordered are listed, but only abnormal results are displayed) Labs Reviewed  URINALYSIS, ROUTINE W REFLEX MICROSCOPIC - Abnormal; Notable for the following components:      Result Value   Specific Gravity, Urine <1.005 (*)    All other components within normal limits  BASIC METABOLIC PANEL - Abnormal; Notable for the following components:   Sodium 133 (*)    Creatinine, Ser 1.15 (*)    Calcium 7.8 (*)    GFR, Estimated 51 (*)    All other components within normal  limits  CBC WITH DIFFERENTIAL/PLATELET - Abnormal; Notable for the following components:   Hemoglobin 8.6 (*)    HCT 27.9 (*)    MCV 71.7 (*)    MCH 22.1 (*)    RDW 17.5 (*)    All other components within normal limits  TSH - Abnormal; Notable for the following components:   TSH 9.338 (*)    All other components within normal limits  TROPONIN I (HIGH SENSITIVITY) - Abnormal; Notable for the following components:   Troponin I (High Sensitivity) 75 (*)    All other components within normal limits  TROPONIN I (HIGH SENSITIVITY) - Abnormal; Notable for the following components:   Troponin I (High Sensitivity) 72 (*)    All other components within normal limits  RESP PANEL BY RT-PCR (FLU A&B, COVID) ARPGX2  BRAIN NATRIURETIC PEPTIDE    EKG EKG Interpretation  Date/Time:  Friday January 10 2020 16:12:13 EST Ventricular Rate:  68 PR Interval:    QRS Duration: 107 QT Interval:  428 QTC Calculation: 456 R Axis:   -3 Text Interpretation: Sinus rhythm Nonspecific T abnormalities, lateral leads Baseline wander in lead(s) V3 V4 No STEMI Confirmed by Alvester Chou [phone]) on 01/10/2020 4:42:37 PM   Radiology DG Chest Portable 1 View  Result Date: 01/10/2020 CLINICAL DATA:  74 year old female with cough EXAM: PORTABLE CHEST 1 VIEW COMPARISON:  08/17/2019 FINDINGS: Cardiomediastinal silhouette unchanged. Similar appearance of the low lung volumes with coarsened interstitial markings. No pneumothorax or pleural effusion. No confluent airspace disease. Cardiac pacing device on the left chest wall, with interval reversal of the orientation. Two leads in place. No acute displaced fracture. IMPRESSION: Low lung volumes without evidence of acute cardiopulmonary disease. Left chest wall pacing device with 2 leads in place. Note that the left right orientation is altered from the comparison, either surgical revision or the device has flipped in the pocket. Correlation with prior recent surgery may  be useful. Electronically Signed   By: Gilmer Mor D.O.   On: 01/10/2020 16:27  Procedures Procedures (including critical care time)  Medications Ordered in ED Medications  sodium chloride 0.9 % bolus 500 mL (0 mLs Intravenous Stopped 01/10/20 1754)  ketorolac (TORADOL) 15 MG/ML injection 15 mg (15 mg Intravenous Given 01/10/20 1635)    ED Course  I have reviewed the triage vital signs and the nursing notes.  Pertinent labs & imaging results that were available during my care of the patient were reviewed by me and considered in my medical decision making (see chart for details).  This patient complains of myalgia, cough, headache  This involves an extensive number of treatment options, and is a complaint that carries with it a high risk of complications and morbidity.  The differential diagnosis includes viral syndrome most likely vs other infection vs atypical ACS vs anemia vs other  The constellation of all of these symptoms is most suggestive of a viral syndrome.   Her flu/covid is negative here.  I ordered, reviewed, and interpreted labs, as noted below I ordered medication toradol, IV fluids for pain and dehydration I ordered imaging studies which included dg chest I independently visualized and interpreted imaging which showed pacemaker in place, low lung volumes and the monitor tracing which showed NSR Additional history was obtained from daughter  *  TSH pending at the time of discharge, as it is a send out test.  I advised her PCP recheck her labs including her thyroid function this week, her hgb and her calcium.   Clinical Course as of 01/11/20 1103  Fri Jan 10, 2020  1832 Trops flat and near baseline.  Labs show hypocalcemia, which may be contributing to some of these symptoms.  She also has some anemia that is microcytic, but not significantly low enough to contribute to her symptoms. [MT]  1920 Feeling better after meds, has PCP appointment in 3 days, will start on  calcium and iron for anemia, advised pcp f/u for recheck of labs and GI follow up for GI bleed w/u [MT]    Clinical Course User Index [MT] Renaye Rakers Kermit Balo, MD    Final Clinical Impression(s) / ED Diagnoses Final diagnoses:  Hypocalcemia  Anemia, unspecified type  Fatigue, unspecified type    Rx / DC Orders ED Discharge Orders         Ordered    calcium carbonate (TUMS) 500 MG chewable tablet  Daily        01/10/20 1925    ferrous sulfate 325 (65 FE) MG tablet  Daily        01/10/20 1925    senna-docusate (SENOKOT-S) 8.6-50 MG tablet  Daily        01/10/20 1925           Terald Sleeper, MD 01/11/20 1103

## 2020-02-17 ENCOUNTER — Ambulatory Visit (INDEPENDENT_AMBULATORY_CARE_PROVIDER_SITE_OTHER): Payer: Medicare Other

## 2020-02-17 DIAGNOSIS — I495 Sick sinus syndrome: Secondary | ICD-10-CM

## 2020-02-17 LAB — CUP PACEART REMOTE DEVICE CHECK
Battery Remaining Longevity: 172 mo
Battery Voltage: 3.18 V
Brady Statistic AP VP Percent: 0.01 %
Brady Statistic AP VS Percent: 24.94 %
Brady Statistic AS VP Percent: 0.04 %
Brady Statistic AS VS Percent: 75.01 %
Brady Statistic RA Percent Paced: 24.79 %
Brady Statistic RV Percent Paced: 0.05 %
Date Time Interrogation Session: 20220123222152
Implantable Lead Implant Date: 20210723
Implantable Lead Implant Date: 20210723
Implantable Lead Location: 753859
Implantable Lead Location: 753860
Implantable Lead Model: 5076
Implantable Lead Model: 5076
Implantable Pulse Generator Implant Date: 20210723
Lead Channel Impedance Value: 323 Ohm
Lead Channel Impedance Value: 342 Ohm
Lead Channel Impedance Value: 399 Ohm
Lead Channel Impedance Value: 418 Ohm
Lead Channel Pacing Threshold Amplitude: 0.5 V
Lead Channel Pacing Threshold Amplitude: 0.625 V
Lead Channel Pacing Threshold Pulse Width: 0.4 ms
Lead Channel Pacing Threshold Pulse Width: 0.4 ms
Lead Channel Sensing Intrinsic Amplitude: 13.75 mV
Lead Channel Sensing Intrinsic Amplitude: 13.75 mV
Lead Channel Sensing Intrinsic Amplitude: 3.125 mV
Lead Channel Sensing Intrinsic Amplitude: 3.125 mV
Lead Channel Setting Pacing Amplitude: 1.5 V
Lead Channel Setting Pacing Amplitude: 2.5 V
Lead Channel Setting Pacing Pulse Width: 0.4 ms
Lead Channel Setting Sensing Sensitivity: 0.9 mV

## 2020-02-28 NOTE — Progress Notes (Signed)
Remote pacemaker transmission.   

## 2020-04-06 ENCOUNTER — Ambulatory Visit: Payer: Medicare Other | Admitting: Adult Health

## 2020-05-18 ENCOUNTER — Ambulatory Visit (INDEPENDENT_AMBULATORY_CARE_PROVIDER_SITE_OTHER): Payer: Medicare Other

## 2020-05-18 DIAGNOSIS — I495 Sick sinus syndrome: Secondary | ICD-10-CM | POA: Diagnosis not present

## 2020-05-19 LAB — CUP PACEART REMOTE DEVICE CHECK
Battery Remaining Longevity: 169 mo
Battery Voltage: 3.15 V
Brady Statistic AP VP Percent: 0.01 %
Brady Statistic AP VS Percent: 20.81 %
Brady Statistic AS VP Percent: 0.03 %
Brady Statistic AS VS Percent: 79.15 %
Brady Statistic RA Percent Paced: 20.63 %
Brady Statistic RV Percent Paced: 0.04 %
Date Time Interrogation Session: 20220425030853
Implantable Lead Implant Date: 20210723
Implantable Lead Implant Date: 20210723
Implantable Lead Location: 753859
Implantable Lead Location: 753860
Implantable Lead Model: 5076
Implantable Lead Model: 5076
Implantable Pulse Generator Implant Date: 20210723
Lead Channel Impedance Value: 304 Ohm
Lead Channel Impedance Value: 323 Ohm
Lead Channel Impedance Value: 361 Ohm
Lead Channel Impedance Value: 418 Ohm
Lead Channel Pacing Threshold Amplitude: 0.5 V
Lead Channel Pacing Threshold Amplitude: 0.75 V
Lead Channel Pacing Threshold Pulse Width: 0.4 ms
Lead Channel Pacing Threshold Pulse Width: 0.4 ms
Lead Channel Sensing Intrinsic Amplitude: 12.875 mV
Lead Channel Sensing Intrinsic Amplitude: 12.875 mV
Lead Channel Sensing Intrinsic Amplitude: 2.875 mV
Lead Channel Sensing Intrinsic Amplitude: 2.875 mV
Lead Channel Setting Pacing Amplitude: 1.5 V
Lead Channel Setting Pacing Amplitude: 2.5 V
Lead Channel Setting Pacing Pulse Width: 0.4 ms
Lead Channel Setting Sensing Sensitivity: 0.9 mV

## 2020-06-05 NOTE — Progress Notes (Signed)
Remote pacemaker transmission.   

## 2020-06-15 ENCOUNTER — Emergency Department (HOSPITAL_BASED_OUTPATIENT_CLINIC_OR_DEPARTMENT_OTHER): Payer: Medicare Other

## 2020-06-15 ENCOUNTER — Other Ambulatory Visit: Payer: Self-pay

## 2020-06-15 ENCOUNTER — Encounter (HOSPITAL_BASED_OUTPATIENT_CLINIC_OR_DEPARTMENT_OTHER): Payer: Self-pay

## 2020-06-15 ENCOUNTER — Emergency Department (HOSPITAL_BASED_OUTPATIENT_CLINIC_OR_DEPARTMENT_OTHER)
Admission: EM | Admit: 2020-06-15 | Discharge: 2020-06-15 | Disposition: A | Discharge status: Home / Self Care | Payer: Medicare Other | Attending: Emergency Medicine | Admitting: Emergency Medicine

## 2020-06-15 DIAGNOSIS — W19XXXA Unspecified fall, initial encounter: Secondary | ICD-10-CM | POA: Diagnosis not present

## 2020-06-15 DIAGNOSIS — Z7982 Long term (current) use of aspirin: Secondary | ICD-10-CM | POA: Diagnosis not present

## 2020-06-15 DIAGNOSIS — M25511 Pain in right shoulder: Secondary | ICD-10-CM | POA: Insufficient documentation

## 2020-06-15 DIAGNOSIS — Y990 Civilian activity done for income or pay: Secondary | ICD-10-CM | POA: Insufficient documentation

## 2020-06-15 DIAGNOSIS — E119 Type 2 diabetes mellitus without complications: Secondary | ICD-10-CM | POA: Insufficient documentation

## 2020-06-15 DIAGNOSIS — R600 Localized edema: Secondary | ICD-10-CM | POA: Insufficient documentation

## 2020-06-15 DIAGNOSIS — Z950 Presence of cardiac pacemaker: Secondary | ICD-10-CM | POA: Insufficient documentation

## 2020-06-15 DIAGNOSIS — Z794 Long term (current) use of insulin: Secondary | ICD-10-CM | POA: Diagnosis not present

## 2020-06-15 DIAGNOSIS — Z7984 Long term (current) use of oral hypoglycemic drugs: Secondary | ICD-10-CM | POA: Insufficient documentation

## 2020-06-15 MED ORDER — OXYCODONE-ACETAMINOPHEN 5-325 MG PO TABS
1.0000 | ORAL_TABLET | Freq: Once | ORAL | Status: AC
Start: 2020-06-15 — End: 2020-06-15
  Administered 2020-06-15: 1 via ORAL
  Filled 2020-06-15: qty 1

## 2020-06-15 NOTE — Discharge Instructions (Addendum)
You are seen in the emergency department for evaluation of right shoulder pain after a fall.  Your x-rays did not show any fracture or dislocation.  You should use ice to the affected area and use of the sling for comfort.  Follow-up with your doctor.  Return to the emergency department for any worsening or concerning symptoms.

## 2020-06-15 NOTE — ED Triage Notes (Signed)
Pt reports she fell at work this morning.  Denies LOC.  States her legs gave out.  Landed on right arm.  Reports right arm pain, reports shoulder pain.

## 2020-06-15 NOTE — ED Provider Notes (Signed)
Today. [facility] EMERGENCY DEPARTMENT Provider Note   CSN: [id number] Arrival date & time: 06/15/20  0940     History Chief Complaint  Patient presents with  . Fall  . Shoulder Injury    Jillian Leblanc is a 75 y.o. female.  She is here for evaluation of right shoulder pain after a fall she says her legs gave out.  This is happened to her before.  She said she has restless legs and they kept her up all night long.  No numbness or weakness.  Denies head or neck pain.  Not on blood thinners.  Denies other complaints.  She is right-hand dominant  The history is provided by the patient.  Fall This is a new problem. The current episode started 1 to 2 hours ago. The problem occurs constantly. The problem has not changed since onset.Pertinent negatives include no chest pain, no abdominal pain, no headaches and no shortness of breath. The symptoms are aggravated by bending and twisting. Nothing relieves the symptoms. She has tried nothing for the symptoms. The treatment provided no relief.  Shoulder Injury This is a new problem. The current episode started 1 to 2 hours ago. The problem occurs constantly. The problem has not changed since onset.Pertinent negatives include no chest pain, no abdominal pain, no headaches and no shortness of breath. The symptoms are aggravated by bending and twisting. Nothing relieves the symptoms. She has tried nothing for the symptoms. The treatment provided no relief.       Past Medical History:  Diagnosis Date  . Diabetes mellitus without complication (HCC)   . Interstitial cystitis   . Thyroid disease     Patient Active Problem List   Diagnosis Date Noted  . Recurrent falls 08/16/2019  . 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 Head injury 06/26/2019  . Frequent falls 06/26/2019    Past Surgical History:  Procedure Laterality Date  . ABDOMINAL  HYSTERECTOMY    . CHOLECYSTECTOMY    . NECK SURGERY    . 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 SHOULDER SURGERY    . THYROIDECTOMY       OB History   No obstetric history on file.     No family history on file.  Social History   Tobacco Use  . Smoking status: Never Smoker  . Smokeless tobacco: Never Used  Vaping Use  . Vaping Use: Never used  Substance Use Topics  . Alcohol use: No  . Drug use: Never    Home Medications Prior to Admission medications   Medication Sig Start Date End Date Taking? Authorizing Provider  ASPIRIN LOW DOSE 81 MG EC tablet Take 81 mg by mouth daily. 06/16/19  Yes [provider]  calcium carbonate (TUMS EX) 750 MG chewable tablet Chew 1-2 tablets by mouth as needed for heartburn.  04/10/11  Yes [provider]  citalopram (CELEXA) 20 MG tablet Take 20 mg by mouth at bedtime. 10/03/14  Yes [provider]  diclofenac Sodium (VOLTAREN) 1 % GEL Apply 2 g topically as needed (pain).  07/23/16  Yes [provider]  fluticasone (FLONASE) 50 MCG/ACT nasal spray Place 1 spray into both nostrils as needed for allergies.  06/09/17  Yes [provider]  glipiZIDE (GLUCOTROL XL) 10 MG 24 hr tablet  Take 10 mg by mouth daily. 07/24/19  Yes [provider]  hydrOXYzine (ATARAX/VISTARIL) 25 MG tablet Take 1-2 tablets by mouth every evening. 06/14/19  Yes [provider]  levothyroxine (SYNTHROID) 125 MCG tablet Take 1 tablet (125 mcg total) by mouth daily. 10/18/19  Yes Barrett, Joline Salt, PA-C  metFORMIN (GLUCOPHAGE) 500 MG tablet Take 1,000 mg by mouth in the morning and at bedtime.   Yes [provider]  omeprazole (PRILOSEC) 40 MG capsule Take 40 mg by mouth as needed (acid reflux).  03/17/18  Yes [provider]  ondansetron (ZOFRAN) 4 MG tablet Take 4 mg  by mouth every 8 (eight) hours as needed for nausea.   Yes [provider]  oxybutynin (DITROPAN-XL) 10 MG 24 hr tablet Take 10 mg by mouth daily. 06/11/19  Yes [provider]  oxyCODONE-acetaminophen (PERCOCET) 10-325 MG per tablet Take 1 tablet by mouth every 8 (eight) hours as needed for pain.    Yes [provider]  promethazine (PHENERGAN) 25 MG tablet Take 25 mg by mouth as needed for nausea.  09/28/16  Yes [provider]  rOPINIRole (REQUIP) 2 MG tablet Take 2 mg by mouth at bedtime.  06/08/19  Yes [provider]  amitriptyline (ELAVIL) 100 MG tablet Take 100 mg by mouth at bedtime. 08/01/19   [provider]  ferrous sulfate 325 (65 FE) MG tablet Take 1 tablet (325 mg total) by mouth daily. 01/10/20 03/10/20  Terald Sleeper, MD  ibuprofen (ADVIL) 600 MG tablet Take 600 mg by mouth every 8 (eight) hours as needed for moderate pain.  06/11/19   [provider]  insulin glargine, 1 Unit Dial, (TOUJEO) 300 UNIT/ML Solostar Pen Inject 15 Units into the skin daily as needed (high Glucose).     [provider]  losartan (COZAAR) 25 MG tablet Take 1 tablet (25 mg total) by mouth daily. 08/17/19   Barrett, Joline Salt, PA-C    Allergies    Pentosan polysulfate sodium, Elmiron [pentosan polysulfate], and Naloxone  Review of Systems   Review of Systems  Constitutional: Negative for fever.  HENT: Negative for sore throat.   Eyes: Negative for visual disturbance.  Respiratory: Negative for shortness of breath.   Cardiovascular: Negative for chest pain.  Gastrointestinal: Negative for abdominal pain.  Genitourinary: Negative for dysuria.  Musculoskeletal: Negative for neck pain.  Skin: Negative for wound.  Neurological: Negative for weakness, numbness and headaches.    Physical Exam Updated Vital Signs BP 102/79 (BP Location: Left Arm)   Pulse 71   Temp 97.8 F (36.6 C) (Oral)   Resp 16   Ht 5' (1.524 m)   Wt 104.3 kg    SpO2 98%   BMI 44.92 kg/m   Physical Exam Vitals and nursing note reviewed.  Constitutional:      General: She is not in acute distress.    Appearance: Normal appearance. She is well-developed.  HENT:     Head: Normocephalic and atraumatic.  Eyes:     Conjunctiva/sclera: Conjunctivae normal.  Cardiovascular:     Rate and Rhythm: Normal rate and regular rhythm.     Heart sounds: No murmur heard.   Pulmonary:     Effort: Pulmonary effort is normal. No respiratory distress.     Breath sounds: Normal breath sounds.  Abdominal:     Palpations: Abdomen is soft.     Tenderness: There is no abdominal tenderness.  Musculoskeletal:        General: Tenderness  present.     Cervical back: Neck supple.     Right lower leg: Edema present.     Left lower leg: Edema present.     Comments: Right upper extremity diffuse tenderness around shoulder.  Clavicle nontender.  Elbow and wrist nontender.  Distal pulses motor and sensation intact.  Other extremities without any pain or limitations.  Skin:    General: Skin is warm and dry.     Capillary Refill: Capillary refill takes less than 2 seconds.  Neurological:     General: No focal deficit present.     Mental Status: She is alert.     ED Results / Procedures / Treatments   Labs (all labs ordered are listed, but only abnormal results are displayed) Labs Reviewed - No data to display  EKG None  Radiology DG Shoulder Right  Result Date: 06/15/2020 CLINICAL DATA:  75 year old female status post fall at work.  Pain. EXAM: RIGHT SHOULDER - 2+ VIEW COMPARISON:  Chest radiographs 08/17/2019 and earlier. FINDINGS: No glenohumeral joint dislocation. Proximal right humerus is intact. Right scapula and right clavicle appear to remain intact. Osteopenia. Visible right ribs appear intact. Chronic scarring or atelectasis along the right minor fissure is stable. Left chest cardiac pacemaker redemonstrated. IMPRESSION: No acute fracture or dislocation  identified about the right shoulder. Electronically Signed   By: Odessa Fleming M.D.   On: 06/15/2020 10:48    Procedures Procedures   Medications Ordered in ED Medications  oxyCODONE-acetaminophen (PERCOCET/ROXICET) 5-325 MG per tablet 1 tablet (has no administration in time range)    ED Course  I have reviewed the triage vital signs and the nursing notes.  Pertinent labs & imaging results that were available during my care of the patient were reviewed by me and considered in my medical decision making (see chart for details).  Clinical Course as of 06/15/20 1711  Mon Jun 15, 2020  1035 X-rays of right shoulder do not show any obvious fracture or dislocation.  Awaiting radiology reading. [MB]  1056 Reviewed the results of the x-ray and the daughter.  Will give sling. [MB]    Clinical Course User Index [MB] Terrilee Files, MD   MDM Rules/Calculators/A&P                          Differential diagnosis includes fracture, contusion, dislocation, muscle strain.  Imaging does not show any fracture or dislocation.  Read with patient and daughter.  Will order sling.  Return instructions discussed. Final Clinical Impression(s) / ED Diagnoses Final diagnoses:  Fall, initial encounter  Acute pain of right shoulder    Rx / DC Orders ED Discharge Orders    None       Terrilee Files, MD 06/15/20 1712

## 2020-06-15 NOTE — ED Notes (Signed)
Patient transported to X-ray 

## 2020-07-14 ENCOUNTER — Emergency Department (HOSPITAL_BASED_OUTPATIENT_CLINIC_OR_DEPARTMENT_OTHER)
Admission: EM | Admit: 2020-07-14 | Discharge: 2020-07-14 | Disposition: A | Discharge status: Home / Self Care | Payer: Medicare Other | Attending: Emergency Medicine | Admitting: Emergency Medicine

## 2020-07-14 ENCOUNTER — Encounter (HOSPITAL_BASED_OUTPATIENT_CLINIC_OR_DEPARTMENT_OTHER): Payer: Self-pay

## 2020-07-14 ENCOUNTER — Other Ambulatory Visit: Payer: Self-pay

## 2020-07-14 ENCOUNTER — Emergency Department (HOSPITAL_BASED_OUTPATIENT_CLINIC_OR_DEPARTMENT_OTHER): Payer: Medicare Other

## 2020-07-14 DIAGNOSIS — Z23 Encounter for immunization: Secondary | ICD-10-CM | POA: Diagnosis not present

## 2020-07-14 DIAGNOSIS — R0789 Other chest pain: Secondary | ICD-10-CM | POA: Insufficient documentation

## 2020-07-14 DIAGNOSIS — Z794 Long term (current) use of insulin: Secondary | ICD-10-CM | POA: Diagnosis not present

## 2020-07-14 DIAGNOSIS — W01198A Fall on same level from slipping, tripping and stumbling with subsequent striking against other object, initial encounter: Secondary | ICD-10-CM | POA: Diagnosis not present

## 2020-07-14 DIAGNOSIS — M25511 Pain in right shoulder: Secondary | ICD-10-CM | POA: Insufficient documentation

## 2020-07-14 DIAGNOSIS — R519 Headache, unspecified: Secondary | ICD-10-CM | POA: Insufficient documentation

## 2020-07-14 DIAGNOSIS — S59901A Unspecified injury of right elbow, initial encounter: Secondary | ICD-10-CM | POA: Diagnosis present

## 2020-07-14 DIAGNOSIS — Z7984 Long term (current) use of oral hypoglycemic drugs: Secondary | ICD-10-CM | POA: Insufficient documentation

## 2020-07-14 DIAGNOSIS — Y9301 Activity, walking, marching and hiking: Secondary | ICD-10-CM | POA: Diagnosis not present

## 2020-07-14 DIAGNOSIS — M79601 Pain in right arm: Secondary | ICD-10-CM

## 2020-07-14 DIAGNOSIS — E119 Type 2 diabetes mellitus without complications: Secondary | ICD-10-CM | POA: Insufficient documentation

## 2020-07-14 DIAGNOSIS — S50311A Abrasion of right elbow, initial encounter: Secondary | ICD-10-CM | POA: Insufficient documentation

## 2020-07-14 DIAGNOSIS — Z7982 Long term (current) use of aspirin: Secondary | ICD-10-CM | POA: Insufficient documentation

## 2020-07-14 DIAGNOSIS — W19XXXA Unspecified fall, initial encounter: Secondary | ICD-10-CM

## 2020-07-14 MED ORDER — ACETAMINOPHEN 325 MG PO TABS
650.0000 mg | ORAL_TABLET | Freq: Once | ORAL | Status: AC
  Administered 2020-07-14: 650 mg via ORAL
  Filled 2020-07-14: qty 2

## 2020-07-14 MED ORDER — LIDOCAINE 5 % EX PTCH: 1.0000 | MEDICATED_PATCH | CUTANEOUS | 0 refills | Status: DC

## 2020-07-14 MED ORDER — TETANUS-DIPHTH-ACELL PERTUSSIS 5-2.5-18.5 LF-MCG/0.5 IM SUSY
0.5000 mL | PREFILLED_SYRINGE | Freq: Once | INTRAMUSCULAR | Status: AC
  Administered 2020-07-14: 0.5 mL via INTRAMUSCULAR
  Filled 2020-07-14: qty 0.5

## 2020-07-14 NOTE — ED Notes (Signed)
Pt discharged to home. Discharge instructions have been discussed with patient and/or family members. Pt verbally acknowledges understanding d/c instructions, and endorses comprehension to checkout at registration before leaving.  °

## 2020-07-14 NOTE — ED Triage Notes (Signed)
Pt states she fell yesterday-pain to right UE-NAD-to triage in w/c

## 2020-07-14 NOTE — ED Notes (Signed)
Taken to xray at this time. 

## 2020-07-14 NOTE — ED Provider Notes (Signed)
[facility] EMERGENCY DEPARTMENT Provider Note   CSN: [id number] Arrival date & time: 07/14/20  1114     History Chief Complaint  Patient presents with  . Fall    Jillian Leblanc is a 75 y.o. female with past medical history significant for diabetes, interstitial cystitis, recurrent falls who presents for evaluation of mechanical fall.  States was walking yesterday when her feet went out from under her.  States she hit her head and landed on her right shoulder.  She has had diffuse pain to her right upper extremity since.  Had difficulty raising her shoulder on her right arm to put close on earlier today.  Unknown last tetanus.  She has abrasion to her right elbow.  She is on any blood thinners.  She has been able to ambulate since the fall.  Has a mild headache on the right side of her head where she hit her head.  No facial trauma.  No lightheadedness, dizziness, vision changes, numbness, weakness, chest pain, shortness of breath abdominal pain.  Denies any pain to her lower extremities.  No pain over clavicle, scapula, midline thoracic and lumbar area.  She denies any syncope.  Denies additional aggravating or alleviating factors  History obtained from patient and past medical records.  No interpreter used.  HPI     Past Medical History:  Diagnosis Date  . Diabetes mellitus without complication (HCC)   . Interstitial cystitis   . Thyroid disease     Patient Active Problem List   Diagnosis Date Noted  . Recurrent falls 08/16/2019  . 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 Head injury 06/26/2019  . Frequent falls 06/26/2019    Past Surgical History:  Procedure Laterality Date  . ABDOMINAL HYSTERECTOMY    . CHOLECYSTECTOMY    . NECK SURGERY    . 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 SHOULDER SURGERY    . THYROIDECTOMY       OB History   No obstetric history on file.     No family history on file.  Social History   Tobacco Use  . Smoking status: Never  . Smokeless tobacco: Never  Vaping Use  . Vaping Use: Never used  Substance Use Topics  . Alcohol use: No  . Drug use: Never    Home Medications Prior to Admission medications   Medication Sig Start Date End Date Taking? Authorizing Provider  lidocaine (LIDODERM) 5 % Place 1 patch onto the skin daily. Remove & Discard patch within 12 hours or as directed by MD 07/14/20  Yes Marykathryn Carboni A, PA-C  amitriptyline (ELAVIL) 100 MG tablet Take 100 mg by mouth at bedtime. 08/01/19   [provider]  ASPIRIN LOW DOSE 81 MG EC tablet Take 81 mg by mouth daily. 06/16/19   [provider]  calcium carbonate (TUMS EX) 750 MG chewable tablet Chew 1-2 tablets by mouth as needed for heartburn.  04/10/11   [provider]  citalopram (CELEXA) 20 MG tablet Take 20 mg by mouth at bedtime. 10/03/14   [provider]  diclofenac Sodium (VOLTAREN) 1 % GEL Apply 2 g topically as needed (pain).  07/23/16   [provider]  ferrous sulfate 325 (65 FE) MG tablet Take 1 tablet (325 mg total) by mouth daily.  01/10/20 03/10/20  Terald Sleeper, MD  fluticasone (FLONASE) 50 MCG/ACT nasal spray Place 1 spray into both nostrils as needed for allergies.  06/09/17   [provider]  glipiZIDE (GLUCOTROL XL) 10 MG 24 hr tablet Take 10 mg by mouth daily. 07/24/19   [provider]  hydrOXYzine (ATARAX/VISTARIL) 25 MG tablet Take 1-2 tablets by mouth every evening. 06/14/19   [provider]  ibuprofen (ADVIL) 600 MG tablet Take 600 mg by mouth every 8 (eight) hours as needed for moderate pain.  06/11/19   [provider]  insulin glargine, 1 Unit Dial, (TOUJEO) 300 UNIT/ML Solostar Pen Inject 15 Units into the skin daily as  needed (high Glucose).     [provider]  levothyroxine (SYNTHROID) 125 MCG tablet Take 1 tablet (125 mcg total) by mouth daily. 10/18/19   Barrett, Joline Salt, PA-C  losartan (COZAAR) 25 MG tablet Take 1 tablet (25 mg total) by mouth daily. 08/17/19   Barrett, Joline Salt, PA-C  metFORMIN (GLUCOPHAGE) 500 MG tablet Take 1,000 mg by mouth in the morning and at bedtime.    [provider]  omeprazole (PRILOSEC) 40 MG capsule Take 40 mg by mouth as needed (acid reflux).  03/17/18   [provider]  ondansetron (ZOFRAN) 4 MG tablet Take 4 mg by mouth every 8 (eight) hours as needed for nausea.    [provider]  oxybutynin (DITROPAN-XL) 10 MG 24 hr tablet Take 10 mg by mouth daily. 06/11/19   [provider]  oxyCODONE-acetaminophen (PERCOCET) 10-325 MG per tablet Take 1 tablet by mouth every 8 (eight) hours as needed for pain.     [provider]  promethazine (PHENERGAN) 25 MG tablet Take 25 mg by mouth as needed for nausea.  09/28/16   [provider]  rOPINIRole (REQUIP) 2 MG tablet Take 2 mg by mouth at bedtime.  06/08/19   [provider]    Allergies    Pentosan polysulfate sodium, Elmiron [pentosan polysulfate], and Naloxone  Review of Systems   Review of Systems  Constitutional: Negative.   HENT: Negative.    Respiratory: Negative.    Cardiovascular: Negative.   Gastrointestinal: Negative.   Genitourinary: Negative.   Musculoskeletal:  Negative for back pain, gait problem, myalgias, neck pain and neck stiffness.       Diffuse right upper extremity pain.  Skin:  Positive for wound.  Neurological:  Positive for headaches. Negative for dizziness, tremors, seizures, syncope, facial asymmetry, speech difficulty, weakness, light-headedness and numbness.  All other systems reviewed and are negative.  Physical Exam Updated Vital Signs BP (!) 155/72 (BP Location: Left Arm)   Pulse 68   Temp (!) 97.5 F (36.4 C) (Oral)    Resp 16   Ht 5' (1.524 m)   Wt 105.7 kg   SpO2 94%   BMI 45.50 kg/m   Physical Exam Vitals and nursing note reviewed.  Constitutional:      General: She is not in acute distress.    Appearance: She is well-developed. She is not ill-appearing, toxic-appearing or diaphoretic.  HENT:     Head: Normocephalic and atraumatic.     Comments: No large hematomas.  No raccoon eyes, battle sign    Nose: Nose normal.     Mouth/Throat:     Mouth: Mucous membranes are moist.  Eyes:     Pupils: Pupils are equal, round, and reactive to light.  Neck:     Comments: No tenderness.  Full range  of motion without difficulty Cardiovascular:     Rate and Rhythm: Normal rate.     Pulses: Normal pulses.          Radial pulses are 2+ on the right side and 2+ on the left side.     Heart sounds: Normal heart sounds.  Pulmonary:     Effort: Pulmonary effort is normal. No respiratory distress.     Breath sounds: Normal breath sounds and air entry.     Comments: Clear bilaterally, speaks in full sentences without difficulty Chest:     Comments: Diffuse tenderness to right upper chest wall into right shoulder.  No crepitus or step-off.  No bony tenderness into clavicle, scapula Abdominal:     General: Bowel sounds are normal. There is no distension.     Palpations: Abdomen is soft.     Tenderness: There is no abdominal tenderness.     Comments: Soft, nontender without rebound or guard  Musculoskeletal:        General: Normal range of motion.     Cervical back: Normal range of motion.     Comments: No midline thoracic, lumbar tenderness.  Diffuse tenderness to right shoulder, humerus, forearm and hand.  Able to flex and extend at olecranon as well as pronate and supinate at right wrist.  Full range of motion left upper extremity.  Difficulty raising right arm overhead secondary to pain.  No tenderness to scapula, clavicle.  Skin:    General: Skin is warm and dry.     Capillary Refill: Capillary refill takes  less than 2 seconds.     Comments: Abrasion to right elbow  Neurological:     General: No focal deficit present.     Mental Status: She is alert.     Cranial Nerves: Cranial nerves are intact.     Sensory: Sensation is intact.     Motor: Motor function is intact.     Comments: Intact sensation Equal grip strength bilaterally Ambulatory without difficulty  Psychiatric:        Mood and Affect: Mood normal.    ED Results / Procedures / Treatments   Labs (all labs ordered are listed, but only abnormal results are displayed) Labs Reviewed - No data to display  EKG None  Radiology DG Ribs Unilateral W/Chest Right  Result Date: 07/14/2020 CLINICAL DATA:  Fall yesterday.  Pain EXAM: RIGHT RIBS AND CHEST - 3+ VIEW COMPARISON:  01/10/2020 FINDINGS: Heart size and vascularity normal. Dual lead pacemaker unchanged. Mild bibasilar atelectasis. No infiltrate or effusion or pneumothorax. Negative for right lower rib fracture. IMPRESSION: Negative for right lower rib fracture Mild bibasilar atelectasis. Electronically Signed   By: Marlan Palau M.D.   On: 07/14/2020 13:04   DG Forearm Right  Result Date: 07/14/2020 CLINICAL DATA:  Fall yesterday EXAM: RIGHT FOREARM - 2 VIEW COMPARISON:  None. FINDINGS: There is no evidence of fracture or other focal bone lesions. Soft tissues are unremarkable. IMPRESSION: Negative. Electronically Signed   By: Marlan Palau M.D.   On: 07/14/2020 13:02   CT Head Wo Contrast  Result Date: 07/14/2020 CLINICAL DATA:  Fall yesterday, hit back of head EXAM: CT HEAD WITHOUT CONTRAST CT CERVICAL SPINE WITHOUT CONTRAST TECHNIQUE: Multidetector CT imaging of the head and cervical spine was performed following the standard protocol without intravenous contrast. Multiplanar CT image reconstructions of the cervical spine were also generated. COMPARISON:  None. FINDINGS: CT HEAD FINDINGS Brain: No evidence of acute infarction, hemorrhage, hydrocephalus, extra-axial collection  or  mass lesion/mass effect. Vascular: No hyperdense vessel or unexpected calcification. Skull: Hyperostosis frontalis. Negative for fracture or focal lesion. Sinuses/Orbits: No acute finding. Other: None. CT CERVICAL SPINE FINDINGS Alignment: Normal. Skull base and vertebrae: No acute fracture. No primary bone lesion or focal pathologic process. Soft tissues and spinal canal: No prevertebral fluid or swelling. No visible canal hematoma. Disc levels: Focally mild disc space height loss and osteophytosis at C6-C7. Disc spaces are otherwise intact. Upper chest: Negative. Other: None. IMPRESSION: 1. No acute intracranial pathology. 2. No fracture or static subluxation of the cervical spine. 3. Focally mild disc space height loss and osteophytosis at C6-C7. Disc spaces are otherwise intact. Electronically Signed   By: Lauralyn PrimesAlex  Bibbey M.D.   On: 07/14/2020 12:38   CT Cervical Spine Wo Contrast  Result Date: 07/14/2020 CLINICAL DATA:  Fall yesterday, hit back of head EXAM: CT HEAD WITHOUT CONTRAST CT CERVICAL SPINE WITHOUT CONTRAST TECHNIQUE: Multidetector CT imaging of the head and cervical spine was performed following the standard protocol without intravenous contrast. Multiplanar CT image reconstructions of the cervical spine were also generated. COMPARISON:  None. FINDINGS: CT HEAD FINDINGS Brain: No evidence of acute infarction, hemorrhage, hydrocephalus, extra-axial collection or mass lesion/mass effect. Vascular: No hyperdense vessel or unexpected calcification. Skull: Hyperostosis frontalis. Negative for fracture or focal lesion. Sinuses/Orbits: No acute finding. Other: None. CT CERVICAL SPINE FINDINGS Alignment: Normal. Skull base and vertebrae: No acute fracture. No primary bone lesion or focal pathologic process. Soft tissues and spinal canal: No prevertebral fluid or swelling. No visible canal hematoma. Disc levels: Focally mild disc space height loss and osteophytosis at C6-C7. Disc spaces are otherwise  intact. Upper chest: Negative. Other: None. IMPRESSION: 1. No acute intracranial pathology. 2. No fracture or static subluxation of the cervical spine. 3. Focally mild disc space height loss and osteophytosis at C6-C7. Disc spaces are otherwise intact. Electronically Signed   By: Lauralyn PrimesAlex  Bibbey M.D.   On: 07/14/2020 12:38   DG Humerus Right  Result Date: 07/14/2020 CLINICAL DATA:  Fall yesterday. EXAM: RIGHT HUMERUS - 2+ VIEW COMPARISON:  None. FINDINGS: There is no evidence of fracture or other focal bone lesions. Soft tissues are unremarkable. IMPRESSION: Negative. Electronically Signed   By: Marlan Palauharles  Clark M.D.   On: 07/14/2020 13:00   DG Hand Complete Right  Result Date: 07/14/2020 CLINICAL DATA:  Fall yesterday EXAM: RIGHT HAND - COMPLETE 3+ VIEW COMPARISON:  None. FINDINGS: Negative for fracture Osteoarthritis. Degenerative changes in the D IP joints and in the second MCP joint. IMPRESSION: Negative for fracture. Electronically Signed   By: Marlan Palauharles  Clark M.D.   On: 07/14/2020 13:03    Procedures .Ortho Injury Treatment  Date/Time: 07/14/2020 2:06 PM Performed by: Ralph LeydenHenderly, Harith Mccadden A, PA-C Authorized by: Linwood DibblesHenderly, Emerly Prak A, PA-C   Consent:    Consent obtained:  Verbal   Consent given by:  Patient   Risks discussed:  Fracture, nerve damage, vascular damage, restricted joint movement, stiffness, recurrent dislocation and irreducible dislocation   Alternatives discussed:  No treatment, alternative treatment, immobilization, referral and delayed treatmentInjury location: shoulder Location details: right shoulder Injury type: soft tissue Pre-procedure neurovascular assessment: neurovascularly intact Pre-procedure distal perfusion: normal Pre-procedure neurological function: normal Pre-procedure range of motion: normal  Anesthesia: Local anesthesia used: no  Patient sedated: NoImmobilization: sling Splint Applied by: ED Tech Post-procedure neurovascular assessment: post-procedure  neurovascularly intact Post-procedure distal perfusion: normal Post-procedure neurological function: normal Post-procedure range of motion: normal     Medications Ordered in ED Medications  Tdap (  BOOSTRIX) injection 0.5 mL (0.5 mLs Intramuscular Given 07/14/20 1233)  acetaminophen (TYLENOL) tablet 650 mg (650 mg Oral Given 07/14/20 1233)   ED Course  I have reviewed the triage vital signs and the nursing notes.  Pertinent labs & imaging results that were available during my care of the patient were reviewed by me and considered in my medical decision making (see chart for details).  Mechanical fall which occurred yesterday.  Hit the right side of her head.  She has no obvious traumatic injuries to her head.  No anticoagulation, LOC or syncope.  Diffuse tenderness to right upper extremity from shoulder, humerus, forearm and hand.  Full range of motion at elbow and hand.  Difficulty with overhead movement to right upper extremity secondary likely to pain.  She has abrasion to elbow.  No bony tenderness to clavicle, scapula.  We will plan on imaging and reassess  CT head without acute findings CT cervical without acute findings DG chest ribs right wo acute findings DG right humerus wo acute findings DG right forearm wo acute findings DG right hand wo acute findings  Patient reassessed. Discussed imaging. No significant findings. Placed in sling for comfort. Discussed FU with Ortho. RICE for symptomatic management. Ambulatory wo difficulty  The patient has been appropriately medically screened and/or stabilized in the ED. I have low suspicion for any other emergent medical condition which would require further screening, evaluation or treatment in the ED or require inpatient management.  [name] hemodynamically stable and in no acute distress.  Patient able to ambulate in department prior to ED.  Evaluation does not show acute pathology that would require ongoing or additional emergent  interventions while in the emergency department or further inpatient treatment.  I have discussed the diagnosis with the patient and answered all questions.  Pain is been managed while in the emergency department and patient has no further complaints prior to discharge.  [name] comfortable with plan discussed in room and is stable for discharge at this time.  I have discussed strict return precautions for returning to the emergency department.  Patient was encouraged to follow-up with PCP/specialist refer to at discharge.   Discussed with attending Dr. Ron Parker with above treatment, plan and disposition.      MDM Rules/Calculators/A&P                           Final Clinical Impression(s) / ED Diagnoses Final diagnoses:  Fall, initial encounter  Pain of right upper extremity    Rx / DC Orders ED Discharge Orders          Ordered    lidocaine (LIDODERM) 5 %  Every 24 hours        07/14/20 1400             Esterlene Atiyeh A, PA-C 07/14/20 1407    Tegeler, Canary Brim, MD 07/14/20 1545

## 2020-07-14 NOTE — Discharge Instructions (Addendum)
Keep the sling on your arm as needed for pain.  May take Tylenol or ibuprofen.  May also place lidocaine patches.  When using the lidocaine patches placed for 12 hours remove for 12 hours.  Place for 12 hours and remove for 12 hours before placing an additional patch  Follow up with Orthopedics if pain does not improve

## 2020-07-25 ENCOUNTER — Other Ambulatory Visit: Payer: Self-pay

## 2020-07-25 ENCOUNTER — Emergency Department (HOSPITAL_BASED_OUTPATIENT_CLINIC_OR_DEPARTMENT_OTHER): Payer: Medicare Other

## 2020-07-25 ENCOUNTER — Encounter (HOSPITAL_BASED_OUTPATIENT_CLINIC_OR_DEPARTMENT_OTHER): Payer: Self-pay | Admitting: Emergency Medicine

## 2020-07-25 ENCOUNTER — Inpatient Hospital Stay (HOSPITAL_BASED_OUTPATIENT_CLINIC_OR_DEPARTMENT_OTHER)
Admission: EM | Admit: 2020-07-25 | Discharge: 2020-07-29 | DRG: 982 | Disposition: A | Discharge status: Home Health | Payer: Medicare Other | Attending: Internal Medicine | Admitting: Internal Medicine

## 2020-07-25 DIAGNOSIS — E890 Postprocedural hypothyroidism: Secondary | ICD-10-CM | POA: Diagnosis present

## 2020-07-25 DIAGNOSIS — E1169 Type 2 diabetes mellitus with other specified complication: Secondary | ICD-10-CM

## 2020-07-25 DIAGNOSIS — D638 Anemia in other chronic diseases classified elsewhere: Secondary | ICD-10-CM | POA: Diagnosis present

## 2020-07-25 DIAGNOSIS — M6282 Rhabdomyolysis: Secondary | ICD-10-CM | POA: Diagnosis present

## 2020-07-25 DIAGNOSIS — Z6839 Body mass index (BMI) 39.0-39.9, adult: Secondary | ICD-10-CM

## 2020-07-25 DIAGNOSIS — Z794 Long term (current) use of insulin: Secondary | ICD-10-CM

## 2020-07-25 DIAGNOSIS — E876 Hypokalemia: Secondary | ICD-10-CM | POA: Diagnosis present

## 2020-07-25 DIAGNOSIS — Z20822 Contact with and (suspected) exposure to covid-19: Secondary | ICD-10-CM | POA: Diagnosis present

## 2020-07-25 DIAGNOSIS — D509 Iron deficiency anemia, unspecified: Secondary | ICD-10-CM | POA: Diagnosis not present

## 2020-07-25 DIAGNOSIS — S42131A Displaced fracture of coracoid process, right shoulder, initial encounter for closed fracture: Secondary | ICD-10-CM | POA: Diagnosis present

## 2020-07-25 DIAGNOSIS — S43004A Unspecified dislocation of right shoulder joint, initial encounter: Secondary | ICD-10-CM

## 2020-07-25 DIAGNOSIS — Z7982 Long term (current) use of aspirin: Secondary | ICD-10-CM

## 2020-07-25 DIAGNOSIS — Z79890 Hormone replacement therapy (postmenopausal): Secondary | ICD-10-CM

## 2020-07-25 DIAGNOSIS — N179 Acute kidney failure, unspecified: Secondary | ICD-10-CM | POA: Diagnosis present

## 2020-07-25 DIAGNOSIS — E860 Dehydration: Secondary | ICD-10-CM | POA: Diagnosis present

## 2020-07-25 DIAGNOSIS — E1122 Type 2 diabetes mellitus with diabetic chronic kidney disease: Secondary | ICD-10-CM | POA: Diagnosis present

## 2020-07-25 DIAGNOSIS — M75100 Unspecified rotator cuff tear or rupture of unspecified shoulder, not specified as traumatic: Secondary | ICD-10-CM | POA: Diagnosis present

## 2020-07-25 DIAGNOSIS — Z950 Presence of cardiac pacemaker: Secondary | ICD-10-CM

## 2020-07-25 DIAGNOSIS — I447 Left bundle-branch block, unspecified: Secondary | ICD-10-CM | POA: Diagnosis present

## 2020-07-25 DIAGNOSIS — W19XXXA Unspecified fall, initial encounter: Secondary | ICD-10-CM | POA: Diagnosis present

## 2020-07-25 DIAGNOSIS — R778 Other specified abnormalities of plasma proteins: Secondary | ICD-10-CM

## 2020-07-25 DIAGNOSIS — Z79899 Other long term (current) drug therapy: Secondary | ICD-10-CM

## 2020-07-25 DIAGNOSIS — M24411 Recurrent dislocation, right shoulder: Secondary | ICD-10-CM | POA: Diagnosis present

## 2020-07-25 DIAGNOSIS — R296 Repeated falls: Secondary | ICD-10-CM | POA: Diagnosis present

## 2020-07-25 DIAGNOSIS — E1142 Type 2 diabetes mellitus with diabetic polyneuropathy: Secondary | ICD-10-CM | POA: Diagnosis present

## 2020-07-25 DIAGNOSIS — Z888 Allergy status to other drugs, medicaments and biological substances status: Secondary | ICD-10-CM

## 2020-07-25 DIAGNOSIS — D649 Anemia, unspecified: Secondary | ICD-10-CM

## 2020-07-25 DIAGNOSIS — Z9181 History of falling: Secondary | ICD-10-CM

## 2020-07-25 DIAGNOSIS — N1831 Chronic kidney disease, stage 3a: Secondary | ICD-10-CM | POA: Diagnosis present

## 2020-07-25 DIAGNOSIS — R410 Disorientation, unspecified: Secondary | ICD-10-CM

## 2020-07-25 DIAGNOSIS — R4182 Altered mental status, unspecified: Secondary | ICD-10-CM | POA: Diagnosis not present

## 2020-07-25 DIAGNOSIS — E871 Hypo-osmolality and hyponatremia: Secondary | ICD-10-CM | POA: Diagnosis present

## 2020-07-25 DIAGNOSIS — Z7984 Long term (current) use of oral hypoglycemic drugs: Secondary | ICD-10-CM

## 2020-07-25 LAB — COMPREHENSIVE METABOLIC PANEL
ALT: 22 U/L (ref 0–44)
AST: 26 U/L (ref 15–41)
Albumin: 3.2 g/dL — ABNORMAL LOW (ref 3.5–5.0)
Alkaline Phosphatase: 65 U/L (ref 38–126)
Anion gap: 11 (ref 5–15)
BUN: 20 mg/dL (ref 8–23)
CO2: 29 mmol/L (ref 22–32)
Calcium: 7 mg/dL — ABNORMAL LOW (ref 8.9–10.3)
Chloride: 85 mmol/L — ABNORMAL LOW (ref 98–111)
Creatinine, Ser: 1.48 mg/dL — ABNORMAL HIGH (ref 0.44–1.00)
GFR, Estimated: 37 mL/min — ABNORMAL LOW (ref >60)
Glucose, Bld: 200 mg/dL — ABNORMAL HIGH (ref 70–99)
Potassium: 3 mmol/L — ABNORMAL LOW (ref 3.5–5.1)
Sodium: 125 mmol/L — ABNORMAL LOW (ref 135–145)
Total Bilirubin: 0.7 mg/dL (ref 0.3–1.2)
Total Protein: 7.1 g/dL (ref 6.5–8.1)

## 2020-07-25 LAB — CBC WITH DIFFERENTIAL/PLATELET
Abs Immature Granulocytes: 0.02 10*3/uL (ref 0.00–0.07)
Basophils Absolute: 0 10*3/uL (ref 0.0–0.1)
Basophils Relative: 0 %
Eosinophils Absolute: 0.1 10*3/uL (ref 0.0–0.5)
Eosinophils Relative: 2 %
HCT: 22.3 % — ABNORMAL LOW (ref 36.0–46.0)
Hemoglobin: 6.9 g/dL — CL (ref 12.0–15.0)
Immature Granulocytes: 0 %
Lymphocytes Relative: 15 %
Lymphs Abs: 0.9 10*3/uL (ref 0.7–4.0)
MCH: 20.5 pg — ABNORMAL LOW (ref 26.0–34.0)
MCHC: 30.9 g/dL (ref 30.0–36.0)
MCV: 66.4 fL — ABNORMAL LOW (ref 80.0–100.0)
Monocytes Absolute: 0.4 10*3/uL (ref 0.1–1.0)
Monocytes Relative: 7 %
Neutro Abs: 4.7 10*3/uL (ref 1.7–7.7)
Neutrophils Relative %: 76 %
Platelets: 347 10*3/uL (ref 150–400)
RBC: 3.36 MIL/uL — ABNORMAL LOW (ref 3.87–5.11)
RDW: 19.4 % — ABNORMAL HIGH (ref 11.5–15.5)
WBC: 6.1 10*3/uL (ref 4.0–10.5)
nRBC: 0 % (ref 0.0–0.2)

## 2020-07-25 LAB — ETHANOL: Alcohol, Ethyl (B): <10 mg/dL (ref <10)

## 2020-07-25 LAB — RESP PANEL BY RT-PCR (FLU A&B, COVID) ARPGX2
Influenza A by PCR: NEGATIVE
Influenza B by PCR: NEGATIVE
SARS Coronavirus 2 by RT PCR: NEGATIVE

## 2020-07-25 LAB — TROPONIN I (HIGH SENSITIVITY): Troponin I (High Sensitivity): 50 ng/L — ABNORMAL HIGH (ref <18)

## 2020-07-25 MED ORDER — FENTANYL CITRATE (PF) 100 MCG/2ML IJ SOLN
50.0000 ug | Freq: Once | INTRAMUSCULAR | Status: AC
  Administered 2020-07-26: 50 ug via INTRAVENOUS
  Filled 2020-07-25: qty 2

## 2020-07-25 MED ORDER — SODIUM CHLORIDE 0.9 % IV BOLUS
500.0000 mL | Freq: Once | INTRAVENOUS | Status: AC
  Administered 2020-07-25: 500 mL via INTRAVENOUS

## 2020-07-25 MED ORDER — POTASSIUM CHLORIDE CRYS ER 20 MEQ PO TBCR
40.0000 meq | EXTENDED_RELEASE_TABLET | Freq: Once | ORAL | Status: AC
  Administered 2020-07-25: 40 meq via ORAL
  Filled 2020-07-25: qty 2

## 2020-07-25 MED ORDER — ETOMIDATE 2 MG/ML IV SOLN
0.1500 mg/kg | Freq: Once | INTRAVENOUS | Status: AC
  Administered 2020-07-26: 13.6 mg via INTRAVENOUS
  Filled 2020-07-25: qty 10

## 2020-07-25 NOTE — ED Provider Notes (Signed)
Emergency Department Provider Note   I have reviewed the triage vital signs and the nursing notes.   HISTORY  Chief Complaint Fall and Altered Mental Status   HPI Jillian Leblanc is a 75 y.o. female with past medical history reviewed below including recurrent falls and intermittent confusion presents to the emergency department with her daughter.  The daughter provides most of the history.  She states that for months her mom has had episodes where she would forget things or say bizarre things.  For example she may show up to work at 4:30 in the morning when they open at 730.  She has also had times when she calls her in a panic not knowing where she is but is sitting on the edge of her bed.  She states these episodes have been fairly infrequent but over the last 2 weeks have become much more frequent.  She has had multiple falls.  The patient lives at home with her husband who also has some mobility issues.  She notes multiple falls last night and today.  The patient was evaluated for this in late 2021.  The episodes were thought at that time to be due to symptomatic bradycardia and she had a Medtronic pacemaker implanted but the daughter states that issues have continued.  She tells me that the PCP is aware of the falls but she is unsure how much they know about the cognitive decline.  She notes the patient does have an appointment on Tuesday with the PCP and she plans to go to have further discussion regarding this.  The patient is able to ambulate with a walker at home.  The daughter feels that the patient and husband are somewhat in denial regarding the issues described above.   Patient tells me that she does feel confused from time to time and has had frequent falls but overall is feeling well.   Daughter states that she was ultimately able to convince her to come to the emergency department today which is why they presented for evaluation.    Past Medical History:  Diagnosis Date    Diabetes mellitus without complication (HCC)    Interstitial cystitis    Thyroid disease     Patient Active Problem List   Diagnosis Date Noted   Symptomatic anemia 07/26/2020   Acute kidney injury superimposed on chronic kidney disease (HCC) 07/26/2020   Hyponatremia 07/26/2020   Hypokalemia 07/26/2020   Dislocated shoulder, right, initial encounter 07/26/2020   Closed displaced fracture of coracoid process of right shoulder 07/26/2020   Diabetes mellitus type 2 in obese (HCC) 07/26/2020   Elevated troponin 07/26/2020   Recurrent falls 08/16/2019   Sinus arrest 08/15/2019   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 No pertinent family history.  Social History Social History   Tobacco Use   Smoking status: Never   Smokeless tobacco: Never  Vaping Use   Vaping Use: Never used  Substance Use Topics   Alcohol use: No   Drug use: Never    Review of Systems  Constitutional: No fever/chills. Positive frequent falls.  Eyes: No visual changes. ENT: No sore throat. Cardiovascular: Denies chest pain. Respiratory: Denies shortness of breath. Gastrointestinal: No abdominal pain.  No nausea, no vomiting.  No diarrhea.  No constipation. Genitourinary: Negative for dysuria. Musculoskeletal: Negative for back pain. Positive right shoulder pain.  Skin: Negative for rash. Neurological: Negative for headaches, focal weakness or  numbness.  10-point ROS otherwise negative.  ____________________________________________   PHYSICAL EXAM:  VITAL SIGNS: Vitals:   07/26/20 1234 07/26/20 1240  BP: (!) 108/56 (!) 108/56  Pulse: 69 69  Resp: 16 16  Temp: 97.8 F (36.6 C) 97.8 F (36.6 C)  SpO2:  100%   Constitutional: Alert. Well appearing and in no acute distress. Eyes: Conjunctivae are normal. PERRL. EOMI. Head: Atraumatic. Nose: No congestion/rhinnorhea. Mouth/Throat: Mucous membranes are moist.   Neck: No stridor.   Cardiovascular: Normal rate, regular rhythm. Good peripheral circulation. Grossly normal heart sounds.   Respiratory: Normal respiratory effort.  No retractions. Lungs CTAB. Gastrointestinal: Soft and nontender. No distention.  Musculoskeletal: No lower extremity tenderness nor edema. No gross deformities of extremities.  Limited range of motion of the right shoulder after recent injury.  Neurologic:  Normal speech and language. No gross focal neurologic deficits are appreciated.  5/5 grip strength.  Unable to perform pronator drift with injury to the right shoulder.  5/5 strength in the bilateral lower extremities with normal sensation.  No facial asymmetry. Skin:  Skin is warm, dry and intact. No rash noted.   ____________________________________________   LABS (all labs ordered are listed, but only abnormal results are displayed)  Labs Reviewed  COMPREHENSIVE METABOLIC PANEL - Abnormal; Notable for the following components:      Result Value   Sodium 125 (*)    Potassium 3.0 (*)    Chloride 85 (*)    Glucose, Bld 200 (*)    Creatinine, Ser 1.48 (*)    Calcium 7.0 (*)    Albumin 3.2 (*)    GFR, Estimated 37 (*)    All other components within normal limits  CBC WITH DIFFERENTIAL/PLATELET - Abnormal; Notable for the following components:   RBC 3.36 (*)    Hemoglobin 6.9 (*)    HCT 22.3 (*)    MCV 66.4 (*)    MCH 20.5 (*)    RDW 19.4 (*)    All other components within normal  limits  URINALYSIS, ROUTINE W REFLEX MICROSCOPIC - Abnormal; Notable for the following components:   APPearance CLOUDY (*)    Glucose, UA >=500 (*)    Hgb urine dipstick TRACE (*)    Leukocytes,Ua MODERATE (*)    All other components within normal limits  URINALYSIS, MICROSCOPIC (REFLEX) - Abnormal; Notable for the following components:   Bacteria, UA MANY (*)    All other components within normal limits  BASIC METABOLIC PANEL - Abnormal; Notable for the following components:   Sodium 129 (*)    Potassium 3.1 (*)    Chloride 90 (*)    Glucose, Bld 123 (*)    Creatinine, Ser 1.36 (*)    Calcium 7.4 (*)    GFR, Estimated 41 (*)    All other components within normal limits  CBC - Abnormal; Notable for the following components:   RBC 3.55 (*)    Hemoglobin 7.2 (*)    HCT 24.4 (*)    MCV  68.7 (*)    MCH 20.3 (*)    MCHC 29.5 (*)    RDW 19.7 (*)    All other components within normal limits  IRON AND TIBC - Abnormal; Notable for the following components:   TIBC 470 (*)    Saturation Ratios 7 (*)    All other components within normal limits  GLUCOSE, CAPILLARY - Abnormal; Notable for the following components:   Glucose-Capillary 113 (*)    All other components within normal limits  CK - Abnormal; Notable for the following components:   Total CK 500 (*)    All other components within normal limits  TSH - Abnormal; Notable for the following components:   TSH 25.065 (*)    All other components within normal limits  GLUCOSE, CAPILLARY - Abnormal; Notable for the following components:   Glucose-Capillary 108 (*)    All other components within normal limits  TROPONIN I (HIGH SENSITIVITY) - Abnormal; Notable for the following components:   Troponin I (High Sensitivity) 50 (*)    All other components within normal limits  TROPONIN I (HIGH SENSITIVITY) - Abnormal; Notable for the following components:   Troponin I (High Sensitivity) 55 (*)    All other components within normal limits   TROPONIN I (HIGH SENSITIVITY) - Abnormal; Notable for the following components:   Troponin I (High Sensitivity) 54 (*)    All other components within normal limits  TROPONIN I (HIGH SENSITIVITY) - Abnormal; Notable for the following components:   Troponin I (High Sensitivity) 55 (*)    All other components within normal limits  RESP PANEL BY RT-PCR (FLU A&B, COVID) ARPGX2  URINE CULTURE  ETHANOL  OCCULT BLOOD X 1 CARD TO LAB, STOOL  MAGNESIUM  FERRITIN  TSH  HEMOGLOBIN A1C  T4, FREE  TYPE AND SCREEN  PREPARE RBC (CROSSMATCH)  ABO/RH   ____________________________________________  EKG   EKG Interpretation  Date/Time:  Saturday July 25 2020 22:09:05 EDT Ventricular Rate:  71 PR Interval:  190 QRS Duration: 131 QT Interval:  440 QTC Calculation: 479 R Axis:   -25 Text Interpretation: Sinus rhythm Multiple premature complexes, vent & supraven Left bundle branch block Confirmed by Alona Bene [phone]) on 07/25/2020 10:14:35 PM         ____________________________________________  RADIOLOGY  DG Chest 2 View  Result Date: 07/25/2020 CLINICAL DATA:  Multiple falls.  Cough. EXAM: CHEST - 2 VIEW COMPARISON:  01/10/2020 FINDINGS: Cardiac pacemaker. Shallow inspiration. Heart size and pulmonary vascularity are normal for technique. Lungs are clear. No pleural effusions. No pneumothorax. Calcification of the aorta. Anterior dislocation of the right shoulder is new since prior study. IMPRESSION: Shallow inspiration. No evidence of active pulmonary disease. Anterior dislocation of the right shoulder. Electronically Signed   By: Burman Nieves M.D.   On: 07/25/2020 23:21   CT Head Wo Contrast  Result Date: 07/25/2020 CLINICAL DATA:  Multiple falls with confusion over the past few weeks. EXAM: CT HEAD WITHOUT CONTRAST TECHNIQUE: Contiguous axial images were obtained from the base of the skull through the vertex without intravenous contrast. COMPARISON:  07/14/2020 FINDINGS: Brain: No  evidence of acute infarction, hemorrhage, hydrocephalus, extra-axial collection or mass lesion/mass effect. Vascular: No hyperdense vessel or unexpected calcification. Skull: Calvarium appears intact. Sinuses/Orbits: Paranasal sinuses and mastoid air cells are clear. Other: None.  No significant changes since prior study. IMPRESSION: No acute intracranial abnormalities. Electronically Signed   By: Burman Nieves M.D.   On: 07/25/2020 23:10   CT Shoulder Right  Wo Contrast  Result Date: 07/26/2020 CLINICAL DATA:  Shoulder dislocation and reduction attempt EXAM: CT OF THE UPPER RIGHT EXTREMITY WITHOUT CONTRAST TECHNIQUE: Multidetector CT imaging of the upper right extremity was performed according to the standard protocol. COMPARISON:  None. FINDINGS: Bones/Joint/Cartilage Anterior dislocation of the glenohumeral joint. There is a small fracture fragment displaced from the coracoid process. Ligaments Suboptimally assessed by CT. Muscles and Tendons Unremarkable Soft tissues Soft tissue swelling. IMPRESSION: Anterior dislocation of the glenohumeral joint with small fracture fragment displaced from the coracoid process. Electronically Signed   By: Deatra RobinsonKevin  Herman M.D.   On: 07/26/2020 02:20   DG Shoulder Right Port  Result Date: 07/26/2020 CLINICAL DATA:  Multiple falls. Right shoulder dislocation on chest radiograph. EXAM: PORTABLE RIGHT SHOULDER COMPARISON:  Right humerus 07/14/2020 FINDINGS: Anterior dislocation of the right shoulder with anterior and inferior displacement of the humeral head with respect to the glenoid. No associated fractures are demonstrated. Acromioclavicular joint is intact. Changes are new since previous study. IMPRESSION: Anterior dislocation of the right shoulder. Electronically Signed   By: Burman NievesWilliam  Stevens M.D.   On: 07/26/2020 00:07    ____________________________________________   PROCEDURES  Procedure(s) performed:   Procedures  None   ____________________________________________   INITIAL IMPRESSION / ASSESSMENT AND PLAN / ED COURSE  Pertinent labs & imaging results that were available during my care of the patient were reviewed by me and considered in my medical decision making (see chart for details).   Patient presents emergency department with multiple falls and apparent cognitive decline.  The patient has had a more rapid decline in the past several weeks but it sounds in talking with her that this has been ongoing for months.  I see a brain MRI from September 2021 prior to Medtronic pacemaker being implanted which all was done for similar issues.  I suspect that this may be due to some undiagnosed neurocognitive/dementia type diagnosis.  Will look for underlying medical reason for sudden decline.  I am not appreciating exam findings that would strongly suspect stroke.  The patient's vital signs are within normal limits.  The daughter feels that they are able to handle these issues at home and plans to go to the PCP visit on Tuesday with the patient to give the PCP a more full understanding of some of the cognitive decline issues they have experienced.  I do think she would benefit from outpatient neuro psych evaluation. Daughter does not want to pursue SNF placement from the ED.   Care transferred to Dr. Read DriversMolpus to follow labs and imaging.  ____________________________________________  FINAL CLINICAL IMPRESSION(S) / ED DIAGNOSES  Final diagnoses:  Hypokalemia  Hyponatremia  Microcytic anemia  Symptomatic anemia  Frequent falls  Closed displaced fracture of coracoid process of right shoulder, initial encounter  Intermittent confusion  Shoulder dislocation, right, initial encounter     MEDICATIONS GIVEN DURING THIS VISIT:  Medications  oxybutynin (DITROPAN-XL) 24 hr tablet 10 mg (0 mg Oral Hold 07/26/20 1120)  acetaminophen (TYLENOL) tablet 650 mg (has no administration in time range)    Or  acetaminophen  (TYLENOL) suppository 650 mg (has no administration in time range)  oxyCODONE (Oxy IR/ROXICODONE) immediate release tablet 5 mg (5 mg Oral Given 07/26/20 1443)  HYDROmorphone (DILAUDID) injection 1 mg (has no administration in time range)  senna-docusate (Senokot-S) tablet 1 tablet (has no administration in time range)  promethazine (PHENERGAN) 12.5 mg in sodium chloride 0.9 % 50 mL IVPB (has no administration in time range)  insulin aspart (  novoLOG) injection 0-15 Units (0 Units Subcutaneous Not Given 07/26/20 1251)  insulin aspart (novoLOG) injection 0-5 Units (has no administration in time range)  ferumoxytol (FERAHEME) 510 mg in sodium chloride 0.9 % 100 mL IVPB (has no administration in time range)  ferrous sulfate tablet 325 mg (has no administration in time range)  ipratropium-albuterol (DUONEB) 0.5-2.5 (3) MG/3ML nebulizer solution 3 mL (has no administration in time range)  lactated ringers infusion (0 mLs Intravenous Hold 07/26/20 1101)  pantoprazole (PROTONIX) EC tablet 40 mg (0 mg Oral Hold 07/26/20 1120)  insulin glargine (LANTUS) injection 10 Units (10 Units Subcutaneous Given 07/26/20 1000)  senna-docusate (Senokot-S) tablet 2 tablet (has no administration in time range)  ceFAZolin (ANCEF) IVPB 2g/100 mL premix (has no administration in time range)  tranexamic acid (CYKLOKAPRON) 1,000 mg in sodium chloride 0.9 % 100 mL IVPB (has no administration in time range)  levothyroxine (SYNTHROID) tablet 125 mcg (has no administration in time range)  sodium chloride 0.9 % bolus 500 mL ( Intravenous Stopped 07/26/20 0024)  potassium chloride SA (KLOR-CON) CR tablet 40 mEq (40 mEq Oral Given 07/25/20 2333)  fentaNYL (SUBLIMAZE) injection 50 mcg (50 mcg Intravenous Given 07/26/20 0037)  etomidate (AMIDATE) injection 13.6 mg (13.6 mg Intravenous Given 07/26/20 0039)  0.9 %  sodium chloride infusion (Manually program via Guardrails IV Fluids) ( Intravenous New Bag/Given 07/26/20 0830)  acetaminophen (TYLENOL)  tablet 650 mg (650 mg Oral Given 07/26/20 0827)  diphenhydrAMINE (BENADRYL) injection 25 mg (25 mg Intravenous Given 07/26/20 0827)  potassium chloride (KLOR-CON) packet 40 mEq (40 mEq Oral Given 07/26/20 1319)    Note:  This document was prepared using Dragon voice recognition software and may include unintentional dictation errors.  Alona Bene, MD, [facility]    Katalyn Matin, Arlyss Repress, MD 07/26/20 [phone]

## 2020-07-25 NOTE — ED Triage Notes (Signed)
Pt's daughter reports multiple falls and confusion over the last few weeks; approx 7 falls last night per husband

## 2020-07-25 NOTE — ED Provider Notes (Signed)
Received patient in signout from Dr. Jacqulyn Bath.  At that time diagnostic studies were pending.  Nursing and physician notes and vitals signs, including pulse oximetry, reviewed.  Summary of this visit's results, reviewed by myself:  EKG:  EKG Interpretation  Date/Time:  Saturday July 25 2020 22:09:05 EDT Ventricular Rate:  71 PR Interval:  190 QRS Duration: 131 QT Interval:  440 QTC Calculation: 479 R Axis:   -25 Text Interpretation: Sinus rhythm Multiple premature complexes, vent & supraven Left bundle branch block Confirmed by Alona Bene [phone]) on 07/25/2020 10:14:35 PM         Labs:  Results for orders placed or performed during the hospital encounter of 07/25/20 (from the past 24 hour(s))  Comprehensive metabolic panel     Status: Abnormal   Collection Time: 07/25/20 10:42 PM  Result Value Ref Range   Sodium 125 (L) 135 - 145 mmol/L   Potassium 3.0 (L) 3.5 - 5.1 mmol/L   Chloride 85 (L) 98 - 111 mmol/L   CO2 29 22 - 32 mmol/L   Glucose, Bld 200 (H) 70 - 99 mg/dL   BUN 20 8 - 23 mg/dL   Creatinine, Ser 7.86 (H) 0.44 - 1.00 mg/dL   Calcium 7.0 (L) 8.9 - 10.3 mg/dL   Total Protein 7.1 6.5 - 8.1 g/dL   Albumin 3.2 (L) 3.5 - 5.0 g/dL   AST 26 15 - 41 U/L   ALT 22 0 - 44 U/L   Alkaline Phosphatase 65 38 - 126 U/L   Total Bilirubin 0.7 0.3 - 1.2 mg/dL   GFR, Estimated 37 (L) >60 mL/min   Anion gap 11 5 - 15  Ethanol     Status: None   Collection Time: 07/25/20 10:42 PM  Result Value Ref Range   Alcohol, Ethyl (B) <10 <10 mg/dL  Troponin I (High Sensitivity)     Status: Abnormal   Collection Time: 07/25/20 10:42 PM  Result Value Ref Range   Troponin I (High Sensitivity) 50 (H) <18 ng/L  CBC with Differential     Status: Abnormal   Collection Time: 07/25/20 10:42 PM  Result Value Ref Range   WBC 6.1 4.0 - 10.5 K/uL   RBC 3.36 (L) 3.87 - 5.11 MIL/uL   Hemoglobin 6.9 (LL) 12.0 - 15.0 g/dL   HCT 75.4 (L) 49.2 - 01.0 %   MCV 66.4 (L) 80.0 - 100.0 fL   MCH 20.5 (L) 26.0  - 34.0 pg   MCHC 30.9 30.0 - 36.0 g/dL   RDW 07.1 (H) 21.9 - 75.8 %   Platelets 347 150 - 400 K/uL   nRBC 0.0 0.0 - 0.2 %   Neutrophils Relative % 76 %   Neutro Abs 4.7 1.7 - 7.7 K/uL   Lymphocytes Relative 15 %   Lymphs Abs 0.9 0.7 - 4.0 K/uL   Monocytes Relative 7 %   Monocytes Absolute 0.4 0.1 - 1.0 K/uL   Eosinophils Relative 2 %   Eosinophils Absolute 0.1 0.0 - 0.5 K/uL   Basophils Relative 0 %   Basophils Absolute 0.0 0.0 - 0.1 K/uL   WBC Morphology MORPHOLOGY UNREMARKABLE    Smear Review MORPHOLOGY UNREMARKABLE    Immature Granulocytes 0 %   Abs Immature Granulocytes 0.02 0.00 - 0.07 K/uL   Polychromasia PRESENT   Resp Panel by RT-PCR (Flu A&B, Covid) Nasopharyngeal Swab     Status: None   Collection Time: 07/25/20 10:42 PM   Specimen: Nasopharyngeal Swab; Nasopharyngeal(NP) swabs in vial transport medium  Result Value Ref Range   SARS Coronavirus 2 by RT PCR NEGATIVE NEGATIVE   Influenza A by PCR NEGATIVE NEGATIVE   Influenza B by PCR NEGATIVE NEGATIVE  Occult blood card to lab, stool Provider will collect     Status: None   Collection Time: 07/26/20  1:04 AM  Result Value Ref Range   Fecal Occult Bld NEGATIVE NEGATIVE  Troponin I (High Sensitivity)     Status: Abnormal   Collection Time: 07/26/20  2:10 AM  Result Value Ref Range   Troponin I (High Sensitivity) 55 (H) <18 ng/L    Imaging Studies: DG Chest 2 View  Result Date: 07/25/2020 CLINICAL DATA:  Multiple falls.  Cough. EXAM: CHEST - 2 VIEW COMPARISON:  01/10/2020 FINDINGS: Cardiac pacemaker. Shallow inspiration. Heart size and pulmonary vascularity are normal for technique. Lungs are clear. No pleural effusions. No pneumothorax. Calcification of the aorta. Anterior dislocation of the right shoulder is new since prior study. IMPRESSION: Shallow inspiration. No evidence of active pulmonary disease. Anterior dislocation of the right shoulder. Electronically Signed   By: Burman Nieves M.D.   On: 07/25/2020 23:21    CT Head Wo Contrast  Result Date: 07/25/2020 CLINICAL DATA:  Multiple falls with confusion over the past few weeks. EXAM: CT HEAD WITHOUT CONTRAST TECHNIQUE: Contiguous axial images were obtained from the base of the skull through the vertex without intravenous contrast. COMPARISON:  07/14/2020 FINDINGS: Brain: No evidence of acute infarction, hemorrhage, hydrocephalus, extra-axial collection or mass lesion/mass effect. Vascular: No hyperdense vessel or unexpected calcification. Skull: Calvarium appears intact. Sinuses/Orbits: Paranasal sinuses and mastoid air cells are clear. Other: None.  No significant changes since prior study. IMPRESSION: No acute intracranial abnormalities. Electronically Signed   By: Burman Nieves M.D.   On: 07/25/2020 23:10   CT Shoulder Right Wo Contrast  Result Date: 07/26/2020 CLINICAL DATA:  Shoulder dislocation and reduction attempt EXAM: CT OF THE UPPER RIGHT EXTREMITY WITHOUT CONTRAST TECHNIQUE: Multidetector CT imaging of the upper right extremity was performed according to the standard protocol. COMPARISON:  None. FINDINGS: Bones/Joint/Cartilage Anterior dislocation of the glenohumeral joint. There is a small fracture fragment displaced from the coracoid process. Ligaments Suboptimally assessed by CT. Muscles and Tendons Unremarkable Soft tissues Soft tissue swelling. IMPRESSION: Anterior dislocation of the glenohumeral joint with small fracture fragment displaced from the coracoid process. Electronically Signed   By: Deatra Robinson M.D.   On: 07/26/2020 02:20   DG Shoulder Right Port  Result Date: 07/26/2020 CLINICAL DATA:  Multiple falls. Right shoulder dislocation on chest radiograph. EXAM: PORTABLE RIGHT SHOULDER COMPARISON:  Right humerus 07/14/2020 FINDINGS: Anterior dislocation of the right shoulder with anterior and inferior displacement of the humeral head with respect to the glenoid. No associated fractures are demonstrated. Acromioclavicular joint is intact.  Changes are new since previous study. IMPRESSION: Anterior dislocation of the right shoulder. Electronically Signed   By: Burman Nieves M.D.   On: 07/26/2020 00:07    Dedicated shoulder film confirms anterior dislocation of the right shoulder.  As the patient has had multiple falls recently it is unclear when exactly this happened.  She did injure her right shoulder and was seen on 07/14/2020 but shoulder x-ray at that time showed no dislocation.  We will attempt reduction under procedural sedation.  Given the patient's significant anemia, worse from 6 months ago, rectal exam was performed.  There was formed stool high in the rectal vault.  Stool on examining glove was brown and sent for Hemoccult testing.  CRITICAL CARE Performed by: Carlisle BeersJohn L Mycala Warshawsky Total critical care time: 30 minutes Critical care time was exclusive of separately billable procedures and treating other patients. Critical care was necessary to treat or prevent imminent or life-threatening deterioration. Critical care was time spent personally by me on the following activities: development of treatment plan with patient and/or surrogate as well as nursing, discussions with consultants, evaluation of patient's response to treatment, examination of patient, obtaining history from patient or surrogate, ordering and performing treatments and interventions, ordering and review of laboratory studies, ordering and review of radiographic studies, pulse oximetry and re-evaluation of patient's condition.  .Sedation  Date/Time: 07/25/2020 11:52 PM Performed by: Cleotha Whalin, MD Authorized by: Brantley Naser, MD   Consent:    Consent obtained:  Verbal and written   Consent given by:  Patient   Risks discussed:  Respiratory compromise necessitating ventilatory assistance and intubation Universal protocol:    Procedure explained and questions answered to patient or proxy's satisfaction: yes     Relevant documents present and verified: yes      Imaging studies available: yes     Required blood products, implants, devices, and special equipment available: yes     Immediately prior to procedure, a time out was called: yes     Patient identity confirmed:  Arm band and verbally with patient Indications:    Procedure performed:  Dislocation reduction   Procedure necessitating sedation performed by:  Physician performing sedation Pre-sedation assessment:    Time since last food or drink:  5.5 hours   ASA classification: class 3 - patient with severe systemic disease     Mouth opening:  3 or more finger widths   Mallampati score:  II - soft palate, uvula, fauces visible   Neck mobility: normal     Pre-sedation assessments completed and reviewed: airway patency, cardiovascular function, hydration status, mental status, nausea/vomiting, pain level, respiratory function and temperature     Pre-sedation assessment completed:  07/25/2020 11:54 PM Immediate pre-procedure details:    Reassessment: Patient reassessed immediately prior to procedure     Reviewed: vital signs     Verified: bag valve mask available, emergency equipment available, intubation equipment available, IV patency confirmed, oxygen available and suction available   Procedure details (see MAR for exact dosages):    Preoxygenation:  Room air   Sedation:  Etomidate   Intended level of sedation: deep   Analgesia:  Fentanyl   Intra-procedure monitoring:  Blood pressure monitoring, continuous capnometry, frequent LOC assessments, cardiac monitor, continuous pulse oximetry and frequent vital sign checks   Intra-procedure events: none     Total Provider sedation time (minutes):  15 Post-procedure details:    Post-sedation assessment completed:  07/26/2020 1:16 AM   Attendance: Constant attendance by certified staff until patient recovered     Recovery: Patient returned to pre-procedure baseline     Post-sedation assessments completed and reviewed: airway patency, cardiovascular  function, hydration status, mental status, nausea/vomiting, pain level, respiratory function and temperature     [name] stable for discharge or admission: yes     Procedure completion:  Tolerated well, no immediate complications .Ortho Injury Treatment  Date/Time: 07/25/2020 11:54 PM Performed by: Marynell Bies, MD Authorized by: Donyea Beverlin, MD   Consent:    Consent obtained:  Written   Consent given by:  Patient   Risks discussed:  Irreducible dislocationInjury location: shoulder Location details: right shoulder Injury type: dislocation Pre-procedure neurovascular assessment: neurovascularly intact Pre-procedure distal perfusion: normal Pre-procedure neurological function:  normal Pre-procedure range of motion: reduced  Anesthesia: Local anesthesia used: no  Patient sedated: Yes. Refer to sedation procedure documentation for details of sedation. Manipulation performed: yes Reduction method: Milch technique and traction and counter traction Reduction successful: no Immobilization: sling Post-procedure neurovascular assessment: post-procedure neurovascularly intact Post-procedure distal perfusion: normal Post-procedure neurological function: normal Post-procedure range of motion: unchanged  1:00 AM Discussed with Venita Lick of [facility].  He would like [place] to get a CT of the right shoulder to evaluate for fracture.  He asked that the admitting hospitalist consult him on the patient's arrival.  1:38 AM Dr. Margo Aye of hospitalist service accepts for admission to [facility].  2:27 AM CT scan shows a displaced fragment of the coracoid process in the right shoulder.  This was not visible on the plain films.  This may explain why reduction was not successful.  Diagnoses:   ICD-10-CM   1. Frequent falls  R29.6     2. Hypokalemia  E87.6     3. Hyponatremia  E87.1     4. Microcytic anemia  D50.9     5. Symptomatic anemia  D64.9     6. Closed displaced fracture of  coracoid process of right shoulder, initial encounter  S42.131A     7. Intermittent confusion  R41.0     8. Shoulder dislocation, right, initial encounter  S43.004A         Shaana Acocella, Jonny Ruiz, MD 07/26/20 [phone]

## 2020-07-26 ENCOUNTER — Emergency Department (HOSPITAL_BASED_OUTPATIENT_CLINIC_OR_DEPARTMENT_OTHER): Payer: Medicare Other

## 2020-07-26 ENCOUNTER — Encounter (HOSPITAL_BASED_OUTPATIENT_CLINIC_OR_DEPARTMENT_OTHER): Payer: Self-pay | Admitting: Internal Medicine

## 2020-07-26 DIAGNOSIS — S42131A Displaced fracture of coracoid process, right shoulder, initial encounter for closed fracture: Secondary | ICD-10-CM

## 2020-07-26 DIAGNOSIS — E876 Hypokalemia: Secondary | ICD-10-CM | POA: Diagnosis present

## 2020-07-26 DIAGNOSIS — D649 Anemia, unspecified: Secondary | ICD-10-CM | POA: Diagnosis not present

## 2020-07-26 DIAGNOSIS — E1169 Type 2 diabetes mellitus with other specified complication: Secondary | ICD-10-CM

## 2020-07-26 DIAGNOSIS — Z6839 Body mass index (BMI) 39.0-39.9, adult: Secondary | ICD-10-CM | POA: Diagnosis not present

## 2020-07-26 DIAGNOSIS — E890 Postprocedural hypothyroidism: Secondary | ICD-10-CM | POA: Diagnosis not present

## 2020-07-26 DIAGNOSIS — E669 Obesity, unspecified: Secondary | ICD-10-CM

## 2020-07-26 DIAGNOSIS — Z20822 Contact with and (suspected) exposure to covid-19: Secondary | ICD-10-CM | POA: Diagnosis not present

## 2020-07-26 DIAGNOSIS — D509 Iron deficiency anemia, unspecified: Secondary | ICD-10-CM | POA: Diagnosis not present

## 2020-07-26 DIAGNOSIS — E871 Hypo-osmolality and hyponatremia: Secondary | ICD-10-CM | POA: Diagnosis not present

## 2020-07-26 DIAGNOSIS — M75100 Unspecified rotator cuff tear or rupture of unspecified shoulder, not specified as traumatic: Secondary | ICD-10-CM | POA: Diagnosis present

## 2020-07-26 DIAGNOSIS — E1122 Type 2 diabetes mellitus with diabetic chronic kidney disease: Secondary | ICD-10-CM | POA: Diagnosis not present

## 2020-07-26 DIAGNOSIS — Z7984 Long term (current) use of oral hypoglycemic drugs: Secondary | ICD-10-CM | POA: Diagnosis not present

## 2020-07-26 DIAGNOSIS — Z9181 History of falling: Secondary | ICD-10-CM | POA: Diagnosis not present

## 2020-07-26 DIAGNOSIS — Z7982 Long term (current) use of aspirin: Secondary | ICD-10-CM | POA: Diagnosis not present

## 2020-07-26 DIAGNOSIS — Z950 Presence of cardiac pacemaker: Secondary | ICD-10-CM | POA: Diagnosis not present

## 2020-07-26 DIAGNOSIS — E860 Dehydration: Secondary | ICD-10-CM | POA: Diagnosis present

## 2020-07-26 DIAGNOSIS — S43004A Unspecified dislocation of right shoulder joint, initial encounter: Secondary | ICD-10-CM

## 2020-07-26 DIAGNOSIS — R296 Repeated falls: Secondary | ICD-10-CM | POA: Diagnosis present

## 2020-07-26 DIAGNOSIS — N1831 Chronic kidney disease, stage 3a: Secondary | ICD-10-CM | POA: Diagnosis not present

## 2020-07-26 DIAGNOSIS — Z79899 Other long term (current) drug therapy: Secondary | ICD-10-CM | POA: Diagnosis not present

## 2020-07-26 DIAGNOSIS — R778 Other specified abnormalities of plasma proteins: Secondary | ICD-10-CM

## 2020-07-26 DIAGNOSIS — Z888 Allergy status to other drugs, medicaments and biological substances status: Secondary | ICD-10-CM | POA: Diagnosis not present

## 2020-07-26 DIAGNOSIS — W19XXXA Unspecified fall, initial encounter: Secondary | ICD-10-CM | POA: Diagnosis present

## 2020-07-26 DIAGNOSIS — E1142 Type 2 diabetes mellitus with diabetic polyneuropathy: Secondary | ICD-10-CM | POA: Diagnosis not present

## 2020-07-26 DIAGNOSIS — M24411 Recurrent dislocation, right shoulder: Secondary | ICD-10-CM | POA: Diagnosis not present

## 2020-07-26 DIAGNOSIS — M6282 Rhabdomyolysis: Secondary | ICD-10-CM | POA: Diagnosis not present

## 2020-07-26 DIAGNOSIS — R4182 Altered mental status, unspecified: Secondary | ICD-10-CM | POA: Diagnosis present

## 2020-07-26 DIAGNOSIS — I447 Left bundle-branch block, unspecified: Secondary | ICD-10-CM | POA: Diagnosis present

## 2020-07-26 DIAGNOSIS — N179 Acute kidney failure, unspecified: Secondary | ICD-10-CM | POA: Diagnosis not present

## 2020-07-26 DIAGNOSIS — D638 Anemia in other chronic diseases classified elsewhere: Secondary | ICD-10-CM | POA: Diagnosis present

## 2020-07-26 LAB — URINALYSIS, ROUTINE W REFLEX MICROSCOPIC
Bilirubin Urine: NEGATIVE
Glucose, UA: >=500 mg/dL — AB
Ketones, ur: NEGATIVE mg/dL
Nitrite: NEGATIVE
Protein, ur: NEGATIVE mg/dL
Specific Gravity, Urine: 1.01 (ref 1.005–1.030)
pH: 5.5 (ref 5.0–8.0)

## 2020-07-26 LAB — BASIC METABOLIC PANEL
Anion gap: 10 (ref 5–15)
BUN: 18 mg/dL (ref 8–23)
CO2: 29 mmol/L (ref 22–32)
Calcium: 7.4 mg/dL — ABNORMAL LOW (ref 8.9–10.3)
Chloride: 90 mmol/L — ABNORMAL LOW (ref 98–111)
Creatinine, Ser: 1.36 mg/dL — ABNORMAL HIGH (ref 0.44–1.00)
GFR, Estimated: 41 mL/min — ABNORMAL LOW (ref >60)
Glucose, Bld: 123 mg/dL — ABNORMAL HIGH (ref 70–99)
Potassium: 3.1 mmol/L — ABNORMAL LOW (ref 3.5–5.1)
Sodium: 129 mmol/L — ABNORMAL LOW (ref 135–145)

## 2020-07-26 LAB — URINALYSIS, MICROSCOPIC (REFLEX): WBC, UA: >50 WBC/hpf (ref 0–5)

## 2020-07-26 LAB — PREPARE RBC (CROSSMATCH)

## 2020-07-26 LAB — IRON AND TIBC
Iron: 34 ug/dL (ref 28–170)
Saturation Ratios: 7 % — ABNORMAL LOW (ref 10.4–31.8)
TIBC: 470 ug/dL — ABNORMAL HIGH (ref 250–450)
UIBC: 436 ug/dL

## 2020-07-26 LAB — FERRITIN: Ferritin: 26 ng/mL (ref 11–307)

## 2020-07-26 LAB — CBC
HCT: 24.4 % — ABNORMAL LOW (ref 36.0–46.0)
Hemoglobin: 7.2 g/dL — ABNORMAL LOW (ref 12.0–15.0)
MCH: 20.3 pg — ABNORMAL LOW (ref 26.0–34.0)
MCHC: 29.5 g/dL — ABNORMAL LOW (ref 30.0–36.0)
MCV: 68.7 fL — ABNORMAL LOW (ref 80.0–100.0)
Platelets: 328 10*3/uL (ref 150–400)
RBC: 3.55 MIL/uL — ABNORMAL LOW (ref 3.87–5.11)
RDW: 19.7 % — ABNORMAL HIGH (ref 11.5–15.5)
WBC: 4.6 10*3/uL (ref 4.0–10.5)
nRBC: 0 % (ref 0.0–0.2)

## 2020-07-26 LAB — OCCULT BLOOD X 1 CARD TO LAB, STOOL: Fecal Occult Bld: NEGATIVE

## 2020-07-26 LAB — MAGNESIUM: Magnesium: 2.2 mg/dL (ref 1.7–2.4)

## 2020-07-26 LAB — CK: Total CK: 500 U/L — ABNORMAL HIGH (ref 38–234)

## 2020-07-26 LAB — GLUCOSE, CAPILLARY
Glucose-Capillary: 113 mg/dL — ABNORMAL HIGH (ref 70–99)
Glucose-Capillary: 108 mg/dL — ABNORMAL HIGH (ref 70–99)
Glucose-Capillary: 132 mg/dL — ABNORMAL HIGH (ref 70–99)
Glucose-Capillary: 151 mg/dL — ABNORMAL HIGH (ref 70–99)

## 2020-07-26 LAB — TSH
TSH: 25.026 u[IU]/mL — ABNORMAL HIGH (ref 0.350–4.500)
TSH: 25.065 u[IU]/mL — ABNORMAL HIGH (ref 0.350–4.500)

## 2020-07-26 LAB — TROPONIN I (HIGH SENSITIVITY)
Troponin I (High Sensitivity): 55 ng/L — ABNORMAL HIGH (ref <18)
Troponin I (High Sensitivity): 54 ng/L — ABNORMAL HIGH (ref <18)
Troponin I (High Sensitivity): 55 ng/L — ABNORMAL HIGH (ref <18)

## 2020-07-26 LAB — T4, FREE: Free T4: 0.76 ng/dL (ref 0.61–1.12)

## 2020-07-26 LAB — ABO/RH: ABO/RH(D): A NEG

## 2020-07-26 MED ORDER — TRANEXAMIC ACID 1000 MG/10ML IV SOLN: 1000.0000 mg | INTRAVENOUS | Status: DC

## 2020-07-26 MED ORDER — SENNOSIDES-DOCUSATE SODIUM 8.6-50 MG PO TABS: 1.0000 | ORAL_TABLET | Freq: Every evening | ORAL | Status: DC | PRN

## 2020-07-26 MED ORDER — LEVOTHYROXINE SODIUM 125 MCG PO TABS
125.0000 ug | ORAL_TABLET | Freq: Every day | ORAL | Status: DC
  Administered 2020-07-26 – 2020-07-29 (×3): 125 ug via ORAL
  Filled 2020-07-26 (×3): qty 1

## 2020-07-26 MED ORDER — LOSARTAN POTASSIUM 50 MG PO TABS: 25.0000 mg | ORAL_TABLET | Freq: Every day | ORAL | Status: DC

## 2020-07-26 MED ORDER — ACETAMINOPHEN 325 MG PO TABS
650.0000 mg | ORAL_TABLET | Freq: Four times a day (QID) | ORAL | Status: DC | PRN
  Administered 2020-07-27 (×2): 650 mg via ORAL
  Filled 2020-07-26 (×2): qty 2

## 2020-07-26 MED ORDER — LACTATED RINGERS IV SOLN: INTRAVENOUS | Status: AC

## 2020-07-26 MED ORDER — ACETAMINOPHEN 650 MG RE SUPP: 650.0000 mg | Freq: Four times a day (QID) | RECTAL | Status: DC | PRN

## 2020-07-26 MED ORDER — OXYCODONE HCL 5 MG PO TABS
5.0000 mg | ORAL_TABLET | Freq: Four times a day (QID) | ORAL | Status: DC | PRN
  Administered 2020-07-26 – 2020-07-29 (×9): 5 mg via ORAL
  Filled 2020-07-26 (×9): qty 1

## 2020-07-26 MED ORDER — SODIUM CHLORIDE 0.9% IV SOLUTION: Freq: Once | INTRAVENOUS | Status: AC

## 2020-07-26 MED ORDER — IPRATROPIUM-ALBUTEROL 0.5-2.5 (3) MG/3ML IN SOLN
3.0000 mL | RESPIRATORY_TRACT | Status: DC | PRN
  Administered 2020-07-29: 3 mL via RESPIRATORY_TRACT
  Filled 2020-07-26: qty 3

## 2020-07-26 MED ORDER — LACTATED RINGERS IV SOLN: INTRAVENOUS | Status: DC

## 2020-07-26 MED ORDER — INSULIN GLARGINE 100 UNIT/ML ~~LOC~~ SOLN
10.0000 [IU] | Freq: Every day | SUBCUTANEOUS | Status: DC
  Administered 2020-07-26 – 2020-07-29 (×4): 10 [IU] via SUBCUTANEOUS
  Filled 2020-07-26 (×4): qty 0.1

## 2020-07-26 MED ORDER — OXYBUTYNIN CHLORIDE ER 5 MG PO TB24
10.0000 mg | ORAL_TABLET | Freq: Every day | ORAL | Status: DC
  Administered 2020-07-26 – 2020-07-28 (×3): 10 mg via ORAL
  Filled 2020-07-26 (×3): qty 2

## 2020-07-26 MED ORDER — DIPHENHYDRAMINE HCL 50 MG/ML IJ SOLN
25.0000 mg | Freq: Once | INTRAMUSCULAR | Status: AC
  Administered 2020-07-26: 25 mg via INTRAVENOUS
  Filled 2020-07-26: qty 1

## 2020-07-26 MED ORDER — POTASSIUM CHLORIDE 20 MEQ PO PACK
40.0000 meq | PACK | ORAL | Status: AC
  Administered 2020-07-26 (×2): 40 meq via ORAL
  Filled 2020-07-26 (×2): qty 2

## 2020-07-26 MED ORDER — SENNOSIDES-DOCUSATE SODIUM 8.6-50 MG PO TABS
2.0000 | ORAL_TABLET | Freq: Every day | ORAL | Status: DC
  Administered 2020-07-26 – 2020-07-28 (×3): 2 via ORAL
  Filled 2020-07-26 (×3): qty 2

## 2020-07-26 MED ORDER — ACETAMINOPHEN 325 MG PO TABS
650.0000 mg | ORAL_TABLET | Freq: Once | ORAL | Status: AC
  Administered 2020-07-26: 650 mg via ORAL
  Filled 2020-07-26: qty 2

## 2020-07-26 MED ORDER — ROPINIROLE HCL 1 MG PO TABS: 2.0000 mg | ORAL_TABLET | Freq: Every day | ORAL | Status: DC

## 2020-07-26 MED ORDER — SODIUM CHLORIDE 0.9 % IV SOLN
510.0000 mg | Freq: Once | INTRAVENOUS | Status: AC
  Administered 2020-07-26: 510 mg via INTRAVENOUS
  Filled 2020-07-26: qty 510

## 2020-07-26 MED ORDER — HYDROMORPHONE HCL 1 MG/ML IJ SOLN
1.0000 mg | INTRAMUSCULAR | Status: DC | PRN
  Administered 2020-07-27 – 2020-07-29 (×5): 1 mg via INTRAVENOUS
  Filled 2020-07-26 (×6): qty 1

## 2020-07-26 MED ORDER — NALOXONE HCL 2 MG/2ML IJ SOSY
PREFILLED_SYRINGE | INTRAMUSCULAR | Status: AC
  Filled 2020-07-26: qty 2

## 2020-07-26 MED ORDER — FERROUS SULFATE 325 (65 FE) MG PO TABS
325.0000 mg | ORAL_TABLET | Freq: Two times a day (BID) | ORAL | Status: DC
  Administered 2020-07-27 (×2): 325 mg via ORAL
  Filled 2020-07-26 (×2): qty 1

## 2020-07-26 MED ORDER — LEVOTHYROXINE SODIUM 125 MCG PO TABS: 125.0000 ug | ORAL_TABLET | Freq: Every day | ORAL | Status: DC

## 2020-07-26 MED ORDER — METFORMIN HCL 500 MG PO TABS: 1000.0000 mg | ORAL_TABLET | Freq: Two times a day (BID) | ORAL | Status: DC

## 2020-07-26 MED ORDER — INSULIN ASPART 100 UNIT/ML IJ SOLN: 0.0000 [IU] | Freq: Every day | INTRAMUSCULAR | Status: DC

## 2020-07-26 MED ORDER — SODIUM CHLORIDE 0.9 % IV SOLN
12.5000 mg | Freq: Four times a day (QID) | INTRAVENOUS | Status: DC | PRN
  Filled 2020-07-26: qty 0.5

## 2020-07-26 MED ORDER — INSULIN ASPART 100 UNIT/ML IJ SOLN
0.0000 [IU] | Freq: Three times a day (TID) | INTRAMUSCULAR | Status: DC
  Administered 2020-07-26: 2 [IU] via SUBCUTANEOUS
  Administered 2020-07-27: 3 [IU] via SUBCUTANEOUS
  Administered 2020-07-28: 2 [IU] via SUBCUTANEOUS

## 2020-07-26 MED ORDER — PANTOPRAZOLE SODIUM 40 MG PO TBEC
40.0000 mg | DELAYED_RELEASE_TABLET | Freq: Two times a day (BID) | ORAL | Status: DC
  Administered 2020-07-26 – 2020-07-29 (×5): 40 mg via ORAL
  Filled 2020-07-26 (×5): qty 1

## 2020-07-26 MED ORDER — CEFAZOLIN SODIUM-DEXTROSE 2-4 GM/100ML-% IV SOLN
2.0000 g | INTRAVENOUS | Status: AC
  Administered 2020-07-28: 2 g via INTRAVENOUS
  Filled 2020-07-26 (×2): qty 100

## 2020-07-26 MED ORDER — FUROSEMIDE 10 MG/ML IJ SOLN: 20.0000 mg | Freq: Once | INTRAMUSCULAR | Status: DC

## 2020-07-26 MED ORDER — TRANEXAMIC ACID-NACL 1000-0.7 MG/100ML-% IV SOLN
1000.0000 mg | INTRAVENOUS | Status: AC
  Administered 2020-07-28: 1000 mg via INTRAVENOUS
  Filled 2020-07-26 (×2): qty 100

## 2020-07-26 NOTE — Progress Notes (Addendum)
PROGRESS NOTE    Rulon AbideGlenda Slezak  ZOX:[id number] DOB: Apr 13, 1945 DOA: 07/25/2020 PCP: [name], Moogali M, MD   Brief Narrative:  [age] year old with history of DM2, anemia, CKD stage IIIa, hypothyroidism admitted to the hospital for recurrent falls.  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  In the ER she was found to be anemic with hemoglobin of 6.9 received 1 unit PRBC transfusion.  Signs of dehydration and right shoulder dislocation.  Attempts for right shoulder dislocation were unsuccessful in the ED therefore orthopedic was consulted.  Found to be in iron deficiency anemia therefore received IV iron with p.o. supplements.  GI team consulted.   Assessment & Plan:   Principal Problem:   Symptomatic anemia Active Problems:   Recurrent falls   Acute kidney injury superimposed on chronic kidney disease (HCC)   Hyponatremia   Hypokalemia   Dislocated shoulder, right, initial encounter   Closed displaced fracture of coracoid process of right shoulder   Diabetes mellitus type 2 in obese (HCC)   Elevated troponin  Symptomatic anemia, iron deficiency - Admission hemoglobin 6.9.  Baseline hemoglobin 8.0.  Status post 1 unit PRBC transfusion.  Closely monitor.  Will transfuse as necessary. - We will consult GI - IV iron today followed by p.o. tomorrow with bowel regimen - PPI twice daily  Hyponatremia/hypokalemia Mild dehydration - Getting IV fluids and electrolyte repletion.  Closely monitor electrolytes  Mild rhabdomyolysis secondary to falls - Gentle hydration  Right shoulder dislocation - Difficulty reduced in the ED.  Orthopedic team consulted by the admitted  Recurrent falls - Echocardiogram 2021-EF 55 to 60%, grade 1 DD. - PT/OT. - Suspect from generalized weakness from dehydration and symptomatic anemia  Asymptomatic Bacteruria -Low threshold to start Abx, monitor for now.   Elevated troponin - Chronic elevation.  No  signs of acute ischemia on  Hypothyroidism with elevated TSH - TSH 25.  On Synthroid 125 mcg daily - Check T4.  Adjust Synthroid as necessary  Diabetes mellitus type 2 Peripheral neuropathy - Hold off on metformin.  Add Lantus 10 units daily and sliding scale Accu-Cheks -Gabapentin once med rec confirmed  Pending med rec per pharmacy  DVT prophylaxis: SCDs Start: 07/26/20 0451 Code Status: Full Family Communication:     Dispo: The patient is from: Home              Anticipated d/c is to: Home              Patient currently is not medically stable to d/c.  Ongoing multiple medical issues including evaluation for symptomatic anemia, right shoulder dislocation, dehydration, rhabdomyolysis.  Thereafter she needs PT/OT.  Unsafe for discharge at this time.  Patient should remain in the hospital least for next 24-48 hours.   Difficult to place patient No       Subjective: Seen and examined at bedside, tells me she has been falling off and on for the past year.  Denies any chest pain or other complaints prior to fall.  [facility] and had EGD/colonoscopy which were negative according to her.  Denies any gross blood or dark stool.  Review of Systems Otherwise negative except as per HPI, including: General: Denies fever, chills, night sweats or unintended weight loss. Resp: Denies cough, wheezing, shortness of breath. Cardiac: Denies chest pain, palpitations, orthopnea, paroxysmal nocturnal dyspnea. GI: Denies abdominal pain, nausea, vomiting, diarrhea or constipation GU: Denies dysuria, frequency, hesitancy or incontinence  MS: Denies muscle aches, joint pain or swelling Neuro: Denies headache, neurologic deficits (focal weakness, numbness, tingling), abnormal gait Psych: Denies anxiety, depression, SI/HI/AVH Skin: Denies new rashes or lesions ID: Denies sick contacts, exotic exposures, travel  Examination:  General exam:  Appears calm and comfortable  Respiratory system: Clear to auscultation. Respiratory effort normal. Cardiovascular system: S1 & S2 heard, RRR. No JVD, murmurs, rubs, gallops or clicks. No pedal edema. Gastrointestinal system: Abdomen is nondistended, soft and nontender. No organomegaly or masses felt. Normal bowel sounds heard. Central nervous system: Alert and oriented. No focal neurological deficits. Extremities: Limited mobility of right upper shoulder Skin: No rashes, lesions or ulcers Psychiatry: Judgement and insight appear normal. Mood & affect appropriate.     Objective: Vitals:   07/26/20 0813 07/26/20 0830 07/26/20 0855 07/26/20 0855  BP: 115/66 115/66 (!) 105/53 (!) 105/53  Pulse: 66 66 (!) 58 (!) 58  Resp: 14 16 14 14   Temp: 97.6 F (36.4 C) 97.6 F (36.4 C) (!) 97.5 F (36.4 C) (!) 97.5 F (36.4 C)  TempSrc:  Oral Oral Oral  SpO2: 99% 99% 98% 98%  Weight:      Height:        Intake/Output Summary (Last 24 hours) at 07/26/2020 0944 Last data filed at 07/26/2020 0517 Gross per 24 hour  Intake 503.58 ml  Output --  Net 503.58 ml   Filed Weights   07/25/20 2209  Weight: 90.7 kg     Data Reviewed:   CBC: Recent Labs  Lab 07/25/20 2242 07/26/20 0528  WBC 6.1 4.6  NEUTROABS 4.7  --   HGB 6.9* 7.2*  HCT 22.3* 24.4*  MCV 66.4* 68.7*  PLT 347 328   Basic Metabolic Panel: Recent Labs  Lab 07/25/20 2242 07/26/20 0528  NA 125* 129*  K 3.0* 3.1*  CL 85* 90*  CO2 29 29  GLUCOSE 200* 123*  BUN 20 18  CREATININE 1.48* 1.36*  CALCIUM 7.0* 7.4*  MG  --  2.2   GFR: Estimated Creatinine Clearance: 37 mL/min (A) (by C-G formula based on SCr of 1.36 mg/dL (H)). Liver Function Tests: Recent Labs  Lab 07/25/20 2242  AST 26  ALT 22  ALKPHOS 65  BILITOT 0.7  PROT 7.1  ALBUMIN 3.2*   No results for input(s): LIPASE, AMYLASE in the last 168 hours. No results for input(s): AMMONIA in the last 168 hours. Coagulation Profile: No results for input(s):  INR, PROTIME in the last 168 hours. Cardiac Enzymes: Recent Labs  Lab 07/26/20 0650  CKTOTAL 500*   BNP (last 3 results) No results for input(s): PROBNP in the last 8760 hours. HbA1C: No results for input(s): HGBA1C in the last 72 hours. CBG: Recent Labs  Lab 07/26/20 0725  GLUCAP 113*   Lipid Profile: No results for input(s): CHOL, HDL, LDLCALC, TRIG, CHOLHDL, LDLDIRECT in the last 72 hours. Thyroid Function Tests: Recent Labs    07/26/20 0534  TSH 25.065*   Anemia Panel: Recent Labs    07/26/20 0528  FERRITIN 26  TIBC 470*  IRON 34   Sepsis Labs: No results for input(s): PROCALCITON, LATICACIDVEN in the last 168 hours.  Recent Results (from the past 240 hour(s))  Resp Panel by RT-PCR (Flu A&B, Covid) Nasopharyngeal Swab     Status: None   Collection Time: 07/25/20 10:42 PM   Specimen: Nasopharyngeal Swab; Nasopharyngeal(NP) swabs in vial transport medium  Result Value Ref Range Status   SARS Coronavirus 2 by RT PCR NEGATIVE NEGATIVE Final  Comment: (NOTE) SARS-CoV-2 target nucleic acids are NOT DETECTED.  The SARS-CoV-2 RNA is generally detectable in upper respiratory specimens during the acute phase of infection. The lowest concentration of SARS-CoV-2 viral copies this assay can detect is 138 copies/mL. A negative result does not preclude SARS-Cov-2 infection and should not be used as the sole basis for treatment or other patient management decisions. A negative result may occur with  improper specimen collection/handling, submission of specimen other than nasopharyngeal swab, presence of viral mutation(s) within the areas targeted by this assay, and inadequate number of viral copies(<138 copies/mL). A negative result must be combined with clinical observations, patient history, and epidemiological information. The expected result is Negative.  Fact Sheet for Patients:  BloggerCourse.com  Fact Sheet for Healthcare Providers:   SeriousBroker.it  This test is no t yet approved or cleared by the [place] FDA and  has been authorized for detection and/or diagnosis of SARS-CoV-2 by FDA under an Emergency Use Authorization (EUA). This EUA will remain  in effect (meaning this test can be used) for the duration of the COVID-19 declaration under Section 564(b)(1) of the Act, 21 U.S.C.section 360bbb-3(b)(1), unless the authorization is terminated  or revoked sooner.       Influenza A by PCR NEGATIVE NEGATIVE Final   Influenza B by PCR NEGATIVE NEGATIVE Final    Comment: (NOTE) The Xpert Xpress SARS-CoV-2/FLU/RSV plus assay is intended as an aid in the diagnosis of influenza from Nasopharyngeal swab specimens and should not be used as a sole basis for treatment. Nasal washings and aspirates are unacceptable for Xpert Xpress SARS-CoV-2/FLU/RSV testing.  Fact Sheet for Patients: BloggerCourse.com  Fact Sheet for Healthcare Providers: SeriousBroker.it  This test is not yet approved or cleared by the [place] FDA and has been authorized for detection and/or diagnosis of SARS-CoV-2 by FDA under an Emergency Use Authorization (EUA). This EUA will remain in effect (meaning this test can be used) for the duration of the COVID-19 declaration under Section 564(b)(1) of the Act, 21 U.S.C. section 360bbb-3(b)(1), unless the authorization is terminated or revoked.  Performed at [facility], [address], [place], [place] [address]          Radiology Studies: DG Chest 2 View  Result Date: 07/25/2020 CLINICAL DATA:  Multiple falls.  Cough. EXAM: CHEST - 2 VIEW COMPARISON:  01/10/2020 FINDINGS: Cardiac pacemaker. Shallow inspiration. Heart size and pulmonary vascularity are normal for technique. Lungs are clear. No pleural effusions. No pneumothorax. Calcification of the aorta. Anterior dislocation of the right shoulder  is new since prior study. IMPRESSION: Shallow inspiration. No evidence of active pulmonary disease. Anterior dislocation of the right shoulder. Electronically Signed   By: Burman Nieves M.D.   On: 07/25/2020 23:21   CT Head Wo Contrast  Result Date: 07/25/2020 CLINICAL DATA:  Multiple falls with confusion over the past few weeks. EXAM: CT HEAD WITHOUT CONTRAST TECHNIQUE: Contiguous axial images were obtained from the base of the skull through the vertex without intravenous contrast. COMPARISON:  07/14/2020 FINDINGS: Brain: No evidence of acute infarction, hemorrhage, hydrocephalus, extra-axial collection or mass lesion/mass effect. Vascular: No hyperdense vessel or unexpected calcification. Skull: Calvarium appears intact. Sinuses/Orbits: Paranasal sinuses and mastoid air cells are clear. Other: None.  No significant changes since prior study. IMPRESSION: No acute intracranial abnormalities. Electronically Signed   By: Burman Nieves M.D.   On: 07/25/2020 23:10   CT Shoulder Right Wo Contrast  Result Date: 07/26/2020 CLINICAL DATA:  Shoulder dislocation and  reduction attempt EXAM: CT OF THE UPPER RIGHT EXTREMITY WITHOUT CONTRAST TECHNIQUE: Multidetector CT imaging of the upper right extremity was performed according to the standard protocol. COMPARISON:  None. FINDINGS: Bones/Joint/Cartilage Anterior dislocation of the glenohumeral joint. There is a small fracture fragment displaced from the coracoid process. Ligaments Suboptimally assessed by CT. Muscles and Tendons Unremarkable Soft tissues Soft tissue swelling. IMPRESSION: Anterior dislocation of the glenohumeral joint with small fracture fragment displaced from the coracoid process. Electronically Signed   By: Deatra Robinson M.D.   On: 07/26/2020 02:20   DG Shoulder Right Port  Result Date: 07/26/2020 CLINICAL DATA:  Multiple falls. Right shoulder dislocation on chest radiograph. EXAM: PORTABLE RIGHT SHOULDER COMPARISON:  Right humerus 07/14/2020  FINDINGS: Anterior dislocation of the right shoulder with anterior and inferior displacement of the humeral head with respect to the glenoid. No associated fractures are demonstrated. Acromioclavicular joint is intact. Changes are new since previous study. IMPRESSION: Anterior dislocation of the right shoulder. Electronically Signed   By: Burman Nieves M.D.   On: 07/26/2020 00:07        Scheduled Meds:  sodium chloride   Intravenous Once   [START ON 07/27/2020] ferrous sulfate  325 mg Oral BID WC   insulin aspart  0-15 Units Subcutaneous TID WC   insulin aspart  0-5 Units Subcutaneous QHS   insulin glargine  10 Units Subcutaneous Daily   levothyroxine  125 mcg Oral Daily   losartan  25 mg Oral Daily   oxybutynin  10 mg Oral Daily   pantoprazole  40 mg Oral BID AC   potassium chloride  40 mEq Oral Q4H   rOPINIRole  2 mg Oral QHS   senna-docusate  2 tablet Oral QHS   Continuous Infusions:  ferumoxytol     lactated ringers     promethazine (PHENERGAN) injection (IM or IVPB)       LOS: 0 days   Time spent= 35 mins    Lasharn Bufkin Joline Maxcy, MD [facility]  If 7PM-7AM, please contact night-coverage  07/26/2020, 9:44 AM

## 2020-07-26 NOTE — Consult Note (Signed)
Reason for Consult: Anemia Referring Physician: Hospital team  Rulon AbideGlenda Leblanc is an 75 y.o. female.  HPI: Patient seen and examined in hospital computer chart reviewed and case discussed with her daughter as well and she says within the last year she has had an endoscopy and colonoscopy at [facility] although I cannot find that in care everywhere and there is documentation that both were done in 2018 but the daughter confirms it was more recent and she is actually been constipated since she hurt her shoulder which is not her usual problem and she has had some nausea and occasional upper tract symptoms but has not seen any blood from her bowels or had any vomiting and she currently is going to have shoulder surgery on Tuesday and supposedly has had diverticular surgery on her colon but has no other specific complaints  Past Medical History:  Diagnosis Date   Diabetes mellitus without complication (HCC)    Interstitial cystitis    Thyroid disease     Past Surgical History:  Procedure Laterality Date   ABDOMINAL HYSTERECTOMY     CHOLECYSTECTOMY     NECK SURGERY     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 No pertinent family history.  Social History:  reports that she has never smoked. She has never used smokeless tobacco. She reports that she does not drink alcohol and does not use drugs.  Allergies:  Allergies  Allergen Reactions   Pentosan Polysulfate Sodium     Other reaction(s): Other (See Comments) ELMIRON Y Drug hair fell out 10/21/2009 12:00:00 AM by Johny BlamerLaquana Glenn CNA 1   Elmiron [Pentosan Polysulfate]    Naloxone Other (See Comments)    Hallucinations Confusion Nightmares    Medications: I have reviewed the patient's current medications.  Results for orders placed or performed  during the hospital encounter of 07/25/20 (from the past 48 hour(s))  Urinalysis, Routine w reflex microscopic Urine, Clean Catch     Status: Abnormal   Collection Time: 07/25/20 12:14 AM  Result Value Ref Range   Color, Urine YELLOW YELLOW   APPearance CLOUDY (A) CLEAR   Specific Gravity, Urine 1.010 1.005 - 1.030   pH 5.5 5.0 - 8.0   Glucose, UA >=500 (A) NEGATIVE mg/dL   Hgb urine dipstick TRACE (A) NEGATIVE   Bilirubin Urine NEGATIVE NEGATIVE   Ketones, ur NEGATIVE NEGATIVE mg/dL   Protein, ur NEGATIVE NEGATIVE mg/dL   Nitrite NEGATIVE NEGATIVE   Leukocytes,Ua MODERATE (A) NEGATIVE    Comment: Performed at [facility] [place], [address] [place], [place] [address]  Urinalysis, Microscopic (reflex)     Status: Abnormal   Collection Time: 07/25/20 12:14 AM  Result Value Ref Range   RBC / HPF 0-5 0 - 5 RBC/hpf   WBC, UA >50 0 - 5 WBC/hpf   Bacteria, UA MANY (A) NONE SEEN   Squamous Epithelial / LPF 0-5 0 - 5    Comment: Performed at [facility] [place], [address] [place], [place] [address]  Comprehensive metabolic panel     Status: Abnormal   Collection Time: 07/25/20 10:42 PM  Result Value Ref Range   Sodium 125 (L) 135 - 145 mmol/L   Potassium 3.0 (L) 3.5 - 5.1 mmol/L   Chloride 85 (L) 98 -  111 mmol/L   CO2 29 22 - 32 mmol/L   Glucose, Bld 200 (H) 70 - 99 mg/dL    Comment: Glucose reference range applies only to samples taken after fasting for at least 8 hours.   BUN 20 8 - 23 mg/dL   Creatinine, Ser 1.61 (H) 0.44 - 1.00 mg/dL   Calcium 7.0 (L) 8.9 - 10.3 mg/dL   Total Protein 7.1 6.5 - 8.1 g/dL   Albumin 3.2 (L) 3.5 - 5.0 g/dL   AST 26 15 - 41 U/L   ALT 22 0 - 44 U/L   Alkaline Phosphatase 65 38 - 126 U/L   Total Bilirubin 0.7 0.3 - 1.2 mg/dL   GFR, Estimated 37 (L) >60 mL/min    Comment: (NOTE) Calculated using the CKD-EPI Creatinine Equation (2021)    Anion gap 11 5 - 15    Comment: Performed at [facility], [address]  [place], [place] [address]  Ethanol     Status: None   Collection Time: 07/25/20 10:42 PM  Result Value Ref Range   Alcohol, Ethyl (B) <10 <10 mg/dL    Comment:        LOWEST DETECTABLE LIMIT FOR SERUM ALCOHOL IS 10 mg/dL FOR MEDICAL PURPOSES ONLY Performed at [facility], [address] [place], [place] [address]   Troponin I (High Sensitivity)     Status: Abnormal   Collection Time: 07/25/20 10:42 PM  Result Value Ref Range   Troponin I (High Sensitivity) 50 (H) <18 ng/L    Comment: (NOTE) Elevated high sensitivity troponin I (hsTnI) values and significant  changes across serial measurements may suggest ACS but many other  chronic and acute conditions are known to elevate hsTnI results.  Refer to the "Links" section for chest pain algorithms and additional  guidance. Performed at [facility], [address] [place], [place] [address]   CBC with Differential     Status: Abnormal   Collection Time: 07/25/20 10:42 PM  Result Value Ref Range   WBC 6.1 4.0 - 10.5 K/uL   RBC 3.36 (L) 3.87 - 5.11 MIL/uL    Comment: REPEATED TO VERIFY   Hemoglobin 6.9 (LL) 12.0 - 15.0 g/dL    Comment: Reticulocyte Hemoglobin testing may be clinically indicated, consider ordering this additional test [id number] THIS CRITICAL RESULT HAS VERIFIED AND BEEN CALLED TO Neita Garnet, RN BY JOSH BOWLBY ON 07 02 2022 AT 2303, AND HAS BEEN READ BACK.     HCT 22.3 (L) 36.0 - 46.0 %   MCV 66.4 (L) 80.0 - 100.0 fL   MCH 20.5 (L) 26.0 - 34.0 pg   MCHC 30.9 30.0 - 36.0 g/dL   RDW 30.8 (H) 65.7 - 84.6 %   Platelets 347 150 - 400 K/uL   nRBC 0.0 0.0 - 0.2 %   Neutrophils Relative % 76 %   Neutro Abs 4.7 1.7 - 7.7 K/uL   Lymphocytes Relative 15 %   Lymphs Abs 0.9 0.7 - 4.0 K/uL   Monocytes Relative 7 %   Monocytes Absolute 0.4 0.1 - 1.0 K/uL   Eosinophils Relative 2 %   Eosinophils Absolute 0.1 0.0 - 0.5 K/uL   Basophils Relative 0 %   Basophils Absolute 0.0 0.0 - 0.1 K/uL   WBC  Morphology MORPHOLOGY UNREMARKABLE    Smear Review MORPHOLOGY UNREMARKABLE    Immature Granulocytes 0 %   Abs Immature Granulocytes 0.02 0.00 - 0.07 K/uL  Polychromasia PRESENT     Comment: Performed at [facility], [address] [place], [place] [address]  Resp Panel by RT-PCR (Flu A&B, Covid) Nasopharyngeal Swab     Status: None   Collection Time: 07/25/20 10:42 PM   Specimen: Nasopharyngeal Swab; Nasopharyngeal(NP) swabs in vial transport medium  Result Value Ref Range   SARS Coronavirus 2 by RT PCR NEGATIVE NEGATIVE    Comment: (NOTE) SARS-CoV-2 target nucleic acids are NOT DETECTED.  The SARS-CoV-2 RNA is generally detectable in upper respiratory specimens during the acute phase of infection. The lowest concentration of SARS-CoV-2 viral copies this assay can detect is 138 copies/mL. A negative result does not preclude SARS-Cov-2 infection and should not be used as the sole basis for treatment or other patient management decisions. A negative result may occur with  improper specimen collection/handling, submission of specimen other than nasopharyngeal swab, presence of viral mutation(s) within the areas targeted by this assay, and inadequate number of viral copies(<138 copies/mL). A negative result must be combined with clinical observations, patient history, and epidemiological information. The expected result is Negative.  Fact Sheet for Patients:  BloggerCourse.com  Fact Sheet for Healthcare Providers:  SeriousBroker.it  This test is no t yet approved or cleared by the [place] FDA and  has been authorized for detection and/or diagnosis of SARS-CoV-2 by FDA under an Emergency Use Authorization (EUA). This EUA will remain  in effect (meaning this test can be used) for the duration of the COVID-19 declaration under Section 564(b)(1) of the Act, 21 U.S.C.section 360bbb-3(b)(1), unless the authorization is  terminated  or revoked sooner.       Influenza A by PCR NEGATIVE NEGATIVE   Influenza B by PCR NEGATIVE NEGATIVE    Comment: (NOTE) The Xpert Xpress SARS-CoV-2/FLU/RSV plus assay is intended as an aid in the diagnosis of influenza from Nasopharyngeal swab specimens and should not be used as a sole basis for treatment. Nasal washings and aspirates are unacceptable for Xpert Xpress SARS-CoV-2/FLU/RSV testing.  Fact Sheet for Patients: BloggerCourse.com  Fact Sheet for Healthcare Providers: SeriousBroker.it  This test is not yet approved or cleared by the [place] FDA and has been authorized for detection and/or diagnosis of SARS-CoV-2 by FDA under an Emergency Use Authorization (EUA). This EUA will remain in effect (meaning this test can be used) for the duration of the COVID-19 declaration under Section 564(b)(1) of the Act, 21 U.S.C. section 360bbb-3(b)(1), unless the authorization is terminated or revoked.  Performed at [facility], [address] [place], [place] [address]   Occult blood card to lab, stool Provider will collect     Status: None   Collection Time: 07/26/20  1:04 AM  Result Value Ref Range   Fecal Occult Bld NEGATIVE NEGATIVE    Comment: Performed at [facility], [address] [place], [place] [address]  Troponin I (High Sensitivity)     Status: Abnormal   Collection Time: 07/26/20  2:10 AM  Result Value Ref Range   Troponin I (High Sensitivity) 55 (H) <18 ng/L    Comment: (NOTE) Elevated high sensitivity troponin I (hsTnI) values and significant  changes across serial measurements may suggest ACS but many other  chronic and acute conditions are known to elevate hsTnI results.  Refer to the "Links" section for chest pain algorithms and additional  guidance. Performed at [facility], [address] [place], [place] [address]   Magnesium  Status: None    Collection Time: 07/26/20  5:28 AM  Result Value Ref Range   Magnesium 2.2 1.7 - 2.4 mg/dL    Comment: Performed at [facility], [address], [place], [place] [address]  Basic metabolic panel     Status: Abnormal   Collection Time: 07/26/20  5:28 AM  Result Value Ref Range   Sodium 129 (L) 135 - 145 mmol/L   Potassium 3.1 (L) 3.5 - 5.1 mmol/L   Chloride 90 (L) 98 - 111 mmol/L   CO2 29 22 - 32 mmol/L   Glucose, Bld 123 (H) 70 - 99 mg/dL    Comment: Glucose reference range applies only to samples taken after fasting for at least 8 hours.   BUN 18 8 - 23 mg/dL   Creatinine, Ser 6.04 (H) 0.44 - 1.00 mg/dL   Calcium 7.4 (L) 8.9 - 10.3 mg/dL   GFR, Estimated 41 (L) >60 mL/min    Comment: (NOTE) Calculated using the CKD-EPI Creatinine Equation (2021)    Anion gap 10 5 - 15    Comment: Performed at [facility], [address], [place], [place] [address]  CBC     Status: Abnormal   Collection Time: 07/26/20  5:28 AM  Result Value Ref Range   WBC 4.6 4.0 - 10.5 K/uL   RBC 3.55 (L) 3.87 - 5.11 MIL/uL   Hemoglobin 7.2 (L) 12.0 - 15.0 g/dL    Comment: Reticulocyte Hemoglobin testing may be clinically indicated, consider ordering this additional test [id number]    HCT 24.4 (L) 36.0 - 46.0 %   MCV 68.7 (L) 80.0 - 100.0 fL   MCH 20.3 (L) 26.0 - 34.0 pg   MCHC 29.5 (L) 30.0 - 36.0 g/dL   RDW 95.6 (H) 21.3 - 08.6 %   Platelets 328 150 - 400 K/uL   nRBC 0.0 0.0 - 0.2 %    Comment: Performed at [facility], [address] [place], [place] [address]  Troponin I (High Sensitivity)     Status: Abnormal   Collection Time: 07/26/20  5:28 AM  Result Value Ref Range   Troponin I (High Sensitivity) 54 (H) <18 ng/L    Comment: (NOTE) Elevated high sensitivity troponin I (hsTnI) values and significant  changes across serial measurements may suggest ACS but many other  chronic and acute conditions are known to elevate hsTnI results.   Refer to the "Links" section for chest pain algorithms and additional  guidance. Performed at [facility], [address] [place], [place] [address]   Type and screen [facility]     Status: None (Preliminary result)   Collection Time: 07/26/20  5:28 AM  Result Value Ref Range   ABO/RH(D) A NEG    Antibody Screen NEG    Sample Expiration 07/29/2020,2359    Unit Number [id number]    Blood Component Type RED CELLS,LR    Unit division 00    Status of Unit ISSUED    Transfusion Status OK TO TRANSFUSE    Crossmatch Result      Compatible Performed at [facility], [address] [place], [place] [address]    Unit Number [id number]    Blood Component Type RED CELLS,LR    Unit division 00    Status of Unit ISSUED    Transfusion Status OK TO TRANSFUSE    Crossmatch Result Compatible   Prepare RBC (crossmatch)     Status: None  Collection Time: 07/26/20  5:28 AM  Result Value Ref Range   Order Confirmation      ORDER PROCESSED BY BLOOD BANK Performed at [facility], [address], [place], [place] [address]   Iron and TIBC     Status: Abnormal   Collection Time: 07/26/20  5:28 AM  Result Value Ref Range   Iron 34 28 - 170 ug/dL   TIBC 696 (H) 295 - 284 ug/dL   Saturation Ratios 7 (L) 10.4 - 31.8 %   UIBC 436 ug/dL    Comment: Performed at [facility], [address], [place], [place] [address]  Ferritin     Status: None   Collection Time: 07/26/20  5:28 AM  Result Value Ref Range   Ferritin 26 11 - 307 ng/mL    Comment: Performed at [facility], [address] [place], [place] [address]  TSH     Status: Abnormal   Collection Time: 07/26/20  5:34 AM  Result Value Ref Range   TSH 25.065 (H) 0.350 - 4.500 uIU/mL    Comment: Performed by a 3rd Generation assay with a functional sensitivity of <=0.01 uIU/mL. Performed at [facility], [address], [place], [place] [address]   ABO/Rh     Status: None   Collection Time: 07/26/20  6:41 AM  Result Value Ref Range   ABO/RH(D)      A NEG Performed at [facility], [address], [place], [place] [address]   Troponin I (High Sensitivity)     Status: Abnormal   Collection Time: 07/26/20  6:41 AM  Result Value Ref Range   Troponin I (High Sensitivity) 55 (H) <18 ng/L    Comment: (NOTE) Elevated high sensitivity troponin I (hsTnI) values and significant  changes across serial measurements may suggest ACS but many other  chronic and acute conditions are known to elevate hsTnI results.  Refer to the "Links" section for chest pain algorithms and additional  guidance. Performed at [facility], [address], [place], [place] [address]   CK     Status: Abnormal   Collection Time: 07/26/20  6:50 AM  Result Value Ref Range   Total CK 500 (H) 38 - 234 U/L    Comment: Performed at [facility], [address], [place], [place] [address]  Glucose, capillary     Status: Abnormal   Collection Time: 07/26/20  7:25 AM  Result Value Ref Range   Glucose-Capillary 113 (H) 70 - 99 mg/dL    Comment: Glucose reference range applies only to samples taken after fasting for at least 8 hours.  Glucose, capillary     Status: Abnormal   Collection Time: 07/26/20 11:47 AM  Result Value Ref Range   Glucose-Capillary 108 (H) 70 - 99 mg/dL    Comment: Glucose reference range applies only to samples taken after fasting for at least 8 hours.    DG Chest 2 View  Result Date: 07/25/2020 CLINICAL DATA:  Multiple falls.  Cough. EXAM: CHEST - 2 VIEW COMPARISON:  01/10/2020 FINDINGS: Cardiac pacemaker. Shallow inspiration. Heart size and pulmonary vascularity are normal for technique. Lungs are clear. No pleural effusions. No pneumothorax. Calcification of the aorta. Anterior dislocation of the right shoulder is new since prior  study. IMPRESSION: Shallow inspiration. No evidence of active pulmonary disease. Anterior dislocation of the right shoulder. Electronically Signed   By: Burman Nieves M.D.   On:  07/25/2020 23:21   CT Head Wo Contrast  Result Date: 07/25/2020 CLINICAL DATA:  Multiple falls with confusion over the past few weeks. EXAM: CT HEAD WITHOUT CONTRAST TECHNIQUE: Contiguous axial images were obtained from the base of the skull through the vertex without intravenous contrast. COMPARISON:  07/14/2020 FINDINGS: Brain: No evidence of acute infarction, hemorrhage, hydrocephalus, extra-axial collection or mass lesion/mass effect. Vascular: No hyperdense vessel or unexpected calcification. Skull: Calvarium appears intact. Sinuses/Orbits: Paranasal sinuses and mastoid air cells are clear. Other: None.  No significant changes since prior study. IMPRESSION: No acute intracranial abnormalities. Electronically Signed   By: Burman Nieves M.D.   On: 07/25/2020 23:10   CT Shoulder Right Wo Contrast  Result Date: 07/26/2020 CLINICAL DATA:  Shoulder dislocation and reduction attempt EXAM: CT OF THE UPPER RIGHT EXTREMITY WITHOUT CONTRAST TECHNIQUE: Multidetector CT imaging of the upper right extremity was performed according to the standard protocol. COMPARISON:  None. FINDINGS: Bones/Joint/Cartilage Anterior dislocation of the glenohumeral joint. There is a small fracture fragment displaced from the coracoid process. Ligaments Suboptimally assessed by CT. Muscles and Tendons Unremarkable Soft tissues Soft tissue swelling. IMPRESSION: Anterior dislocation of the glenohumeral joint with small fracture fragment displaced from the coracoid process. Electronically Signed   By: Deatra Robinson M.D.   On: 07/26/2020 02:20   DG Shoulder Right Port  Result Date: 07/26/2020 CLINICAL DATA:  Multiple falls. Right shoulder dislocation on chest radiograph. EXAM: PORTABLE RIGHT SHOULDER COMPARISON:  Right humerus 07/14/2020 FINDINGS: Anterior  dislocation of the right shoulder with anterior and inferior displacement of the humeral head with respect to the glenoid. No associated fractures are demonstrated. Acromioclavicular joint is intact. Changes are new since previous study. IMPRESSION: Anterior dislocation of the right shoulder. Electronically Signed   By: Burman Nieves M.D.   On: 07/26/2020 00:07    Review of Systems negative except above Blood pressure (!) 108/56, pulse 69, temperature 97.8 F (36.6 C), temperature source Oral, resp. rate 16, height 5' (1.524 m), weight 90.7 kg, SpO2 100 %. Physical Exam vital signs stable afebrile no acute distress abdomen is soft nontender BUN okay she is slightly iron deficient but has chronic anemia and is guaiac negative  Assessment/Plan: Iron deficiency questionable etiology in a patient with supposedly nondiagnostic work-up within the last year Plan: Would use once a day pump inhibitors and MiraLAX once or twice a day and call me back during this hospital stay if I can be of any further assistance otherwise have recommended after her shoulder surgery following up with her primary gastroenterologist at [facility] to decide if she needs any further work-up and plans and if needed consider a repeat abdominal pelvis CT just to rule out anything significant as the first test [facility] E 07/26/2020, 1:45 PM

## 2020-07-26 NOTE — H&P (Signed)
[facility] Physical    Jillian Leblanc ZOX:[id number] DOB: 1945/07/25 DOA: 07/25/2020  PCP: Karle Plumber, MD   Patient coming from: Home  Chief Complaint: Falls, right shoulder pain  HPI: Jillian Leblanc is a 75 y.o. female with medical history significant for DMT2, anemia, CKD 3A, thyroid disease who has been having recurrent falls for the past 6-8 months. In late 2021 she had multiple falls that were determined to be due to symptomatic bradycardia and she had a [employer] pacemaker implanted.  She reports that the falls got better for a while but in the last few weeks she has had recurrent falls again.  The daughter reportedly told the ER physician the patient is also had intermittent bouts of confusion and being very forgetful or saying bizarre things.  Yesterday she had a fall where she landed on her right shoulder.  She is complained of pain in her right arm with limited ability to move her arm due to the pain since the fall.  She lives at home with her husband who has mobility issues.  She normally uses a walker when she ambulates.  Denies having any chest pain or palpitations prior to her fall.  She states that she did hit her head on the ground but had no loss of consciousness.  She denies any headache, nausea vomiting or diarrhea.  She reports she has had decreased energy level and is feels fatigued frequently.  She reports she does have some shortness of breath with exertion but improves when she rests.  After prompting by her daughter she finally agreed to come to the emergency room in [facility] for evaluation. In the emergency room she was found to have a dislocation of her right shoulder.  Her physician attempted to relocate the shoulder but was not able to.  Case was discussed with orthopedic surgery and CT scan of the shoulder was obtained.  CT scan reveals a mildly displaced coracoid process fracture and patient was placed in a sling.  Orthopedic surgery is to see patient in the  morning.  She was also found to be significantly anemic with a hemoglobin of 6.9.  She had a rectal exam with brown stool and negative Hemoccult in the emergency room.  She was found to have a low sodium of 125 and a low potassium at 3.0.  The hospitalist service was asked to evaluate and manage patient overnight and await further recommendations from orthopedic surgery.  Patient having symptomatic anemia transfusion of packed red blood cells as ordered.   Review of Systems:  General: Reports falls and weakness. Denies fever, chills, weight loss, night sweats.  Denies dizziness.   HENT: Denies headache, denies change in hearing, tinnitus.  Denies nasal congestion or bleeding.  Denies sore throat.  Denies difficulty swallowing Eyes: Denies blurry vision, pain in eye, drainage.  Denies discoloration of eyes. Neck: Denies pain.  Denies swelling.  Denies pain with movement. Cardiovascular: Denies chest pain, palpitations.  Denies edema.  Denies orthopnea Respiratory: Denies shortness of breath, cough.  Denies wheezing.  Denies sputum production Gastrointestinal: Denies abdominal pain, swelling.  Denies nausea, vomiting, diarrhea.  Denies melena.  Denies hematemesis. Musculoskeletal: Reports pain in right shoulder and limitation of movement right shoulder.  Denies swelling. Genitourinary: Denies pelvic pain.  Denies urinary frequency or hesitancy.  Denies dysuria.  Skin: Denies rash.  Denies petechiae, purpura, ecchymosis. Neurological: Denies syncope. Denies seizure activity. Denies paresthesia. Denies slurred speech, drooping face.  Denies visual change. Psychiatric: Denies depression, anxiety.  Denies hallucinations.  Past Medical History:  Diagnosis Date   Diabetes mellitus without complication (HCC)    Interstitial cystitis    Thyroid disease     Past Surgical History:  Procedure Laterality Date   ABDOMINAL HYSTERECTOMY     CHOLECYSTECTOMY     NECK SURGERY     PACEMAKER IMPLANT N/A  08/16/2019   Procedure: PACEMAKER IMPLANT;  Surgeon: Regan Lemming, MD;  Location: [facility] INVASIVE [facility];  Service: Cardiovascular;  Laterality: N/A;   SHOULDER SURGERY     THYROIDECTOMY      Social History  reports that she has never smoked. She has never used smokeless tobacco. She reports that she does not drink alcohol and does not use drugs.  Allergies  Allergen Reactions   Pentosan Polysulfate Sodium     Other reaction(s): Other (See Comments) ELMIRON Y Drug hair fell out 10/21/2009 12:00:00 AM by Johny Blamer CNA 1   Elmiron [Pentosan Polysulfate]    Naloxone Other (See Comments)    Hallucinations Confusion Nightmares    History reviewed. No pertinent family history.   Prior to Admission medications   Medication Sig Start Date End Date Taking? Authorizing Provider  amitriptyline (ELAVIL) 100 MG tablet Take 100 mg by mouth at bedtime. 08/01/19   [provider]  ASPIRIN LOW DOSE 81 MG EC tablet Take 81 mg by mouth daily. 06/16/19   [provider]  calcium carbonate (TUMS EX) 750 MG chewable tablet Chew 1-2 tablets by mouth as needed for heartburn.  04/10/11   [provider]  citalopram (CELEXA) 20 MG tablet Take 20 mg by mouth at bedtime. 10/03/14   [provider]  diclofenac Sodium (VOLTAREN) 1 % GEL Apply 2 g topically as needed (pain).  07/23/16   [provider]  ferrous sulfate 325 (65 FE) MG tablet Take 1 tablet (325 mg total) by mouth daily. 01/10/20 03/10/20  Terald Sleeper, MD  fluticasone (FLONASE) 50 MCG/ACT nasal spray Place 1 spray into both nostrils as needed for allergies.  06/09/17   [provider]  glipiZIDE (GLUCOTROL XL) 10 MG 24 hr tablet Take 10 mg by mouth daily. 07/24/19   [provider]  hydrOXYzine (ATARAX/VISTARIL) 25 MG tablet Take 1-2 tablets by mouth every evening. 06/14/19   [provider]  ibuprofen (ADVIL) 600 MG tablet Take 600 mg by mouth every 8 (eight) hours as needed  for moderate pain.  06/11/19   [provider]  insulin glargine, 1 Unit Dial, (TOUJEO) 300 UNIT/ML Solostar Pen Inject 15 Units into the skin daily as needed (high Glucose).     [provider]  levothyroxine (SYNTHROID) 125 MCG tablet Take 1 tablet (125 mcg total) by mouth daily. 10/18/19   Barrett, Joline Salt, PA-C  lidocaine (LIDODERM) 5 % Place 1 patch onto the skin daily. Remove & Discard patch within 12 hours or as directed by MD 07/14/20   Henderly, Britni A, PA-C  losartan (COZAAR) 25 MG tablet Take 1 tablet (25 mg total) by mouth daily. 08/17/19   Barrett, Joline Salt, PA-C  metFORMIN (GLUCOPHAGE) 500 MG tablet Take 1,000 mg by mouth in the morning and at bedtime.    [provider]  omeprazole (PRILOSEC) 40 MG capsule Take 40 mg by mouth as needed (acid reflux).  03/17/18   [provider]  ondansetron (ZOFRAN) 4 MG tablet Take 4 mg by mouth every 8 (eight) hours as needed for nausea.    [provider]  oxybutynin (DITROPAN-XL)  10 MG 24 hr tablet Take 10 mg by mouth daily. 06/11/19   [provider]  oxyCODONE-acetaminophen (PERCOCET) 10-325 MG per tablet Take 1 tablet by mouth every 8 (eight) hours as needed for pain.     [provider]  promethazine (PHENERGAN) 25 MG tablet Take 25 mg by mouth as needed for nausea.  09/28/16   [provider]  rOPINIRole (REQUIP) 2 MG tablet Take 2 mg by mouth at bedtime.  06/08/19   [provider]    Physical Exam: Vitals:   07/26/20 0055 07/26/20 0100 07/26/20 0200 07/26/20 0416  BP: (!) 132/50 (!) 131/58 (!) 100/55 (!) 143/129  Pulse: 63 66 61 79  Resp: 14 16 16 20   Temp: 98.6 F (37 C) 98.6 F (37 C)  97.8 F (36.6 C)  TempSrc: Oral Oral  Oral  SpO2: 100% 100% 100% 97%  Weight:      Height:        Constitutional: NAD, calm, comfortable Vitals:   07/26/20 0055 07/26/20 0100 07/26/20 0200 07/26/20 0416  BP: (!) 132/50 (!) 131/58 (!) 100/55 (!) 143/129  Pulse: 63 66  61 79  Resp: 14 16 16 20   Temp: 98.6 F (37 C) 98.6 F (37 C)  97.8 F (36.6 C)  TempSrc: Oral Oral  Oral  SpO2: 100% 100% 100% 97%  Weight:      Height:       General: WDWN, Alert and oriented x3.  Eyes: EOMI, PERRL, conjunctivae normal.  Sclera nonicteric HENT:  [place]/AT, external ears normal.  Nares patent without epistasis.  Mucous membranes are moist.  Neck: Soft, normal range of motion, supple, no masses, Trachea midline Respiratory: clear to auscultation bilaterally, no wheezing, no crackles. Normal respiratory effort. No accessory muscle use.  Cardiovascular: Regular rate and rhythm, no murmurs / rubs / gallops. No extremity edema. 2+ pedal pulses.   Abdomen: Soft, no tenderness, nondistended, no rebound or guarding.  No masses palpated. Bowel sounds normoactive Musculoskeletal: LROM right arm. Right arm in sling. Normal ROM of left upper extremity and bilateral lower extremities. no  cyanosis.  Normal muscle tone.  Skin: Warm, dry, intact no rashes, lesions, ulcers. No induration Neurologic: CN 2-12 grossly intact.  Normal speech.  Sensation intact to touch.    Psychiatric: Normal judgment and insight.  Normal mood.    Labs on Admission: I have personally reviewed following labs and imaging studies  CBC: Recent Labs  Lab 07/25/20 2242  WBC 6.1  NEUTROABS 4.7  HGB 6.9*  HCT 22.3*  MCV 66.4*  PLT 347    Basic Metabolic Panel: Recent Labs  Lab 07/25/20 2242  NA 125*  K 3.0*  CL 85*  CO2 29  GLUCOSE 200*  BUN 20  CREATININE 1.48*  CALCIUM 7.0*    GFR: Estimated Creatinine Clearance: 34 mL/min (A) (by C-G formula based on SCr of 1.48 mg/dL (H)).  Liver Function Tests: Recent Labs  Lab 07/25/20 2242  AST 26  ALT 22  ALKPHOS 65  BILITOT 0.7  PROT 7.1  ALBUMIN 3.2*    Urine analysis:    Component Value Date/Time   COLORURINE YELLOW 07/25/2020 0014   APPEARANCEUR CLOUDY (A) 07/25/2020 0014   LABSPEC 1.010 07/25/2020 0014   PHURINE 5.5 07/25/2020  0014   GLUCOSEU >=500 (A) 07/25/2020 0014   HGBUR TRACE (A) 07/25/2020 0014   BILIRUBINUR NEGATIVE 07/25/2020 0014   KETONESUR NEGATIVE 07/25/2020 0014   PROTEINUR NEGATIVE 07/25/2020 0014   UROBILINOGEN 0.2 09/15/2012 1536  NITRITE NEGATIVE 07/25/2020 0014   LEUKOCYTESUR MODERATE (A) 07/25/2020 0014    Radiological Exams on Admission: DG Chest 2 View  Result Date: 07/25/2020 CLINICAL DATA:  Multiple falls.  Cough. EXAM: CHEST - 2 VIEW COMPARISON:  01/10/2020 FINDINGS: Cardiac pacemaker. Shallow inspiration. Heart size and pulmonary vascularity are normal for technique. Lungs are clear. No pleural effusions. No pneumothorax. Calcification of the aorta. Anterior dislocation of the right shoulder is new since prior study. IMPRESSION: Shallow inspiration. No evidence of active pulmonary disease. Anterior dislocation of the right shoulder. Electronically Signed   By: Burman NievesWilliam  Stevens M.D.   On: 07/25/2020 23:21   CT Head Wo Contrast  Result Date: 07/25/2020 CLINICAL DATA:  Multiple falls with confusion over the past few weeks. EXAM: CT HEAD WITHOUT CONTRAST TECHNIQUE: Contiguous axial images were obtained from the base of the skull through the vertex without intravenous contrast. COMPARISON:  07/14/2020 FINDINGS: Brain: No evidence of acute infarction, hemorrhage, hydrocephalus, extra-axial collection or mass lesion/mass effect. Vascular: No hyperdense vessel or unexpected calcification. Skull: Calvarium appears intact. Sinuses/Orbits: Paranasal sinuses and mastoid air cells are clear. Other: None.  No significant changes since prior study. IMPRESSION: No acute intracranial abnormalities. Electronically Signed   By: Burman NievesWilliam  Stevens M.D.   On: 07/25/2020 23:10   CT Shoulder Right Wo Contrast  Result Date: 07/26/2020 CLINICAL DATA:  Shoulder dislocation and reduction attempt EXAM: CT OF THE UPPER RIGHT EXTREMITY WITHOUT CONTRAST TECHNIQUE: Multidetector CT imaging of the upper right extremity was  performed according to the standard protocol. COMPARISON:  None. FINDINGS: Bones/Joint/Cartilage Anterior dislocation of the glenohumeral joint. There is a small fracture fragment displaced from the coracoid process. Ligaments Suboptimally assessed by CT. Muscles and Tendons Unremarkable Soft tissues Soft tissue swelling. IMPRESSION: Anterior dislocation of the glenohumeral joint with small fracture fragment displaced from the coracoid process. Electronically Signed   By: Deatra RobinsonKevin  Herman M.D.   On: 07/26/2020 02:20   DG Shoulder Right Port  Result Date: 07/26/2020 CLINICAL DATA:  Multiple falls. Right shoulder dislocation on chest radiograph. EXAM: PORTABLE RIGHT SHOULDER COMPARISON:  Right humerus 07/14/2020 FINDINGS: Anterior dislocation of the right shoulder with anterior and inferior displacement of the humeral head with respect to the glenoid. No associated fractures are demonstrated. Acromioclavicular joint is intact. Changes are new since previous study. IMPRESSION: Anterior dislocation of the right shoulder. Electronically Signed   By: Burman NievesWilliam  Stevens M.D.   On: 07/26/2020 00:07    EKG: Independently reviewed.  EKG shows normal sinus rhythm with PVCs, left bundle branch block.  No acute ST elevation or depression.  QTc 479  Assessment/Plan Principal Problem:   Symptomatic anemia Ms. Jillian Leblanc is placed on telemetry floor.  Transfuse two units of PRBC.  Anemia is microcytic so will check iron, TIBC, ferritin Monitor Hgb/Hct after transfusion.   Active Problems:   Acute kidney injury superimposed on chronic kidney disease Creatinine level mildly elevated at 1.48 which is up from baseline of 1.15. IV fluid with LR at 100 ml/hr provided.  Check electrolytes and renal function in the morning with labs    Hyponatremia Patient placed on IV fluid with LR as above.  Monitor electrolytes    Hypokalemia She was given p.o. potassium in the emergency room.  We will check magnesium level now and if  low will replete. Potassium level will be rechecked in a.m. with morning labs and if still low will replete    Dislocated shoulder, right, initial encounter Patient with anterior dislocation of her right shoulder that  was not able to be reduced in the emergency room.  Orthopedics has been consulted and Dr. Shon Baton will see patient this morning. She is in a sling.    Closed displaced fracture of coracoid process of right shoulder Orthopedic surgery to evaluate this morning    Diabetes mellitus type 2 in obese  Continue home metformin dose.  Check hemoglobin A1c.  Monitor blood sugars with meals and at bedtime.  Sliding scale insulin will be provided as needed for glycemic control.    Elevated troponin Troponin levels mildly elevated at 50 and 55 in the emergency room.  We will check 2 more serial troponins.  No acute changes on EKG    Recurrent falls Patient will need PT evaluation    DVT prophylaxis: SCDs for DVT prophylaxis.   Code Status:   Full Code  Family Communication:  Diagnosis and plan discussed with patient.  Patient verbalized understanding agrees with plan.  She agrees to blood transfusion after risks and benefits explained to her. Further recommendations to follow as clinical indicated after risk and benefit Disposition Plan:   [name] from:  Home  Anticipated DC to:  Home  Consults called:  Orthopedic surgery, Dr. Shon Baton with [facility] will see in am  Admission status:  Observation but most likely will need to be changed to inpatient   Carlton Adam MD [facility]  How to contact the [facility] Attending or Consulting provider 7A - 7P or covering provider during after hours 7P -7A, for this patient?   Check the care team in [facility] and look for a) attending/consulting [facility] provider listed and b) the [facility] team listed Log into [email or web address] and use [place]'s universal password to access. If you do not have the password, please contact the hospital  operator. Locate the [facility] provider you are looking for under [facility] and page to a number that you can be directly reached. If you still have difficulty reaching the provider, please page the [facility] (Director on Call) for the Hospitalists listed on amion for assistance.  07/26/2020, 4:54 AM

## 2020-07-26 NOTE — Sedation Documentation (Signed)
RUE CMS remain intact after procedure. Per EDP unable to relocate shoulder. EDP performed rectal exam for hemoccult test at this time.

## 2020-07-26 NOTE — Progress Notes (Signed)
PT Cancellation Note  Patient Details Name: Jillian Leblanc MRN: [id number] DOB: Jul 03, 1945   Cancelled Treatment:     PT order received but eval deferred 2* Hgb 7.1 with transfusion ordered and pending ortho consult.  Will follow.   Ruben Pyka 07/26/2020, 8:17 AM

## 2020-07-26 NOTE — Consult Note (Signed)
Reason for Consult: Chronic right shoulder dislocation Referring Physician: EDP/hospitalist  HPI: Jillian Leblanc is an 75 y.o. female with reported history of multiple falls over the past year, complaining of significant right shoulder pain for over 1 month thought to be related to a fall, presented to an outside urgent care yesterday where patient was noted to be profoundly anemic as well as having diffuse swelling tenderness and painful guarding of the right shoulder.  Subsequent x-rays confirmed an anterior inferior dislocation of the right shoulder with a fracture of the coracoid tip.  Initial attempt was made at closed reduction without success.  She has subsequently been transferred to [facility] for admission to address her underlying medical comorbidities as well as develop plans for definitive treatment of her chronic right shoulder dislocation.  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 No pertinent family history.  Social History:  reports that she has never smoked. She has never used smokeless tobacco. She reports that she does not drink alcohol and does not use drugs.  Allergies:  Allergies  Allergen Reactions   Pentosan Polysulfate Sodium     Other reaction(s): Other (See Comments) ELMIRON Y Drug hair fell out 10/21/2009 12:00:00 AM by Johny Blamer CNA 1   Elmiron [Pentosan Polysulfate]    Naloxone Other (See Comments)    Hallucinations Confusion Nightmares    Medications: I have reviewed the patient's current  medications.  Results for orders placed or performed during the hospital encounter of 07/25/20 (from the past 48 hour(s))  Urinalysis, Routine w reflex microscopic Urine, Clean Catch     Status: Abnormal   Collection Time: 07/25/20 12:14 AM  Result Value Ref Range   Color, Urine YELLOW YELLOW   APPearance CLOUDY (A) CLEAR   Specific Gravity, Urine 1.010 1.005 - 1.030   pH 5.5 5.0 - 8.0   Glucose, UA >=500 (A) NEGATIVE mg/dL   Hgb urine dipstick TRACE (A) NEGATIVE   Bilirubin Urine NEGATIVE NEGATIVE   Ketones, ur NEGATIVE NEGATIVE mg/dL   Protein, ur NEGATIVE NEGATIVE mg/dL   Nitrite NEGATIVE NEGATIVE   Leukocytes,Ua MODERATE (A) NEGATIVE    Comment: Performed at [facility], [address] [place], [place] [address]  Urinalysis, Microscopic (reflex)     Status: Abnormal   Collection Time: 07/25/20 12:14 AM  Result Value Ref Range   RBC / HPF 0-5 0 - 5 RBC/hpf   WBC, UA >50 0 - 5 WBC/hpf   Bacteria, UA MANY (A) NONE SEEN   Squamous Epithelial / LPF 0-5 0 - 5    Comment: Performed at [facility], [address] [place], [place] [address]  Comprehensive metabolic panel     Status: Abnormal   Collection Time: 07/25/20 10:42 PM  Result Value Ref Range   Sodium 125 (L) 135 - 145 mmol/L   Potassium 3.0 (L) 3.5 - 5.1 mmol/L   Chloride 85 (L) 98 - 111 mmol/L   CO2 29 22 -  32 mmol/L   Glucose, Bld 200 (H) 70 - 99 mg/dL    Comment: Glucose reference range applies only to samples taken after fasting for at least 8 hours.   BUN 20 8 - 23 mg/dL   Creatinine, Ser 6.30 (H) 0.44 - 1.00 mg/dL   Calcium 7.0 (L) 8.9 - 10.3 mg/dL   Total Protein 7.1 6.5 - 8.1 g/dL   Albumin 3.2 (L) 3.5 - 5.0 g/dL   AST 26 15 - 41 U/L   ALT 22 0 - 44 U/L   Alkaline Phosphatase 65 38 - 126 U/L   Total Bilirubin 0.7 0.3 - 1.2 mg/dL   GFR, Estimated 37 (L) >60 mL/min    Comment: (NOTE) Calculated using the CKD-EPI Creatinine Equation (2021)    Anion gap 11 5 - 15    Comment:  Performed at [facility], [address] [place], [place] [address]  Ethanol     Status: None   Collection Time: 07/25/20 10:42 PM  Result Value Ref Range   Alcohol, Ethyl (B) <10 <10 mg/dL    Comment:        LOWEST DETECTABLE LIMIT FOR SERUM ALCOHOL IS 10 mg/dL FOR MEDICAL PURPOSES ONLY Performed at [facility], [address] [place], [place] [address]   Troponin I (High Sensitivity)     Status: Abnormal   Collection Time: 07/25/20 10:42 PM  Result Value Ref Range   Troponin I (High Sensitivity) 50 (H) <18 ng/L    Comment: (NOTE) Elevated high sensitivity troponin I (hsTnI) values and significant  changes across serial measurements may suggest ACS but many other  chronic and acute conditions are known to elevate hsTnI results.  Refer to the "Links" section for chest pain algorithms and additional  guidance. Performed at [facility], [address] [place], [place] [address]   CBC with Differential     Status: Abnormal   Collection Time: 07/25/20 10:42 PM  Result Value Ref Range   WBC 6.1 4.0 - 10.5 K/uL   RBC 3.36 (L) 3.87 - 5.11 MIL/uL    Comment: REPEATED TO VERIFY   Hemoglobin 6.9 (LL) 12.0 - 15.0 g/dL    Comment: Reticulocyte Hemoglobin testing may be clinically indicated, consider ordering this additional test [id number] THIS CRITICAL RESULT HAS VERIFIED AND BEEN CALLED TO Neita Garnet, RN BY JOSH BOWLBY ON 07 02 2022 AT 2303, AND HAS BEEN READ BACK.     HCT 22.3 (L) 36.0 - 46.0 %   MCV 66.4 (L) 80.0 - 100.0 fL   MCH 20.5 (L) 26.0 - 34.0 pg   MCHC 30.9 30.0 - 36.0 g/dL   RDW 23.7 (H) 62.8 - 31.5 %   Platelets 347 150 - 400 K/uL   nRBC 0.0 0.0 - 0.2 %   Neutrophils Relative % 76 %   Neutro Abs 4.7 1.7 - 7.7 K/uL   Lymphocytes Relative 15 %   Lymphs Abs 0.9 0.7 - 4.0 K/uL   Monocytes Relative 7 %   Monocytes Absolute 0.4 0.1 - 1.0 K/uL   Eosinophils Relative 2 %   Eosinophils Absolute 0.1 0.0 - 0.5 K/uL   Basophils  Relative 0 %   Basophils Absolute 0.0 0.0 - 0.1 K/uL   WBC Morphology MORPHOLOGY UNREMARKABLE    Smear Review MORPHOLOGY UNREMARKABLE    Immature Granulocytes 0 %   Abs Immature Granulocytes 0.02 0.00 - 0.07 K/uL   Polychromasia PRESENT     Comment: Performed  at [facility], [address] [place], [place] [address]  Resp Panel by RT-PCR (Flu A&B, Covid) Nasopharyngeal Swab     Status: None   Collection Time: 07/25/20 10:42 PM   Specimen: Nasopharyngeal Swab; Nasopharyngeal(NP) swabs in vial transport medium  Result Value Ref Range   SARS Coronavirus 2 by RT PCR NEGATIVE NEGATIVE    Comment: (NOTE) SARS-CoV-2 target nucleic acids are NOT DETECTED.  The SARS-CoV-2 RNA is generally detectable in upper respiratory specimens during the acute phase of infection. The lowest concentration of SARS-CoV-2 viral copies this assay can detect is 138 copies/mL. A negative result does not preclude SARS-Cov-2 infection and should not be used as the sole basis for treatment or other patient management decisions. A negative result may occur with  improper specimen collection/handling, submission of specimen other than nasopharyngeal swab, presence of viral mutation(s) within the areas targeted by this assay, and inadequate number of viral copies(<138 copies/mL). A negative result must be combined with clinical observations, patient history, and epidemiological information. The expected result is Negative.  Fact Sheet for Patients:  BloggerCourse.com  Fact Sheet for Healthcare Providers:  SeriousBroker.it  This test is no t yet approved or cleared by the [place] FDA and  has been authorized for detection and/or diagnosis of SARS-CoV-2 by FDA under an Emergency Use Authorization (EUA). This EUA will remain  in effect (meaning this test can be used) for the duration of the COVID-19 declaration under Section 564(b)(1) of the Act,  21 U.S.C.section 360bbb-3(b)(1), unless the authorization is terminated  or revoked sooner.       Influenza A by PCR NEGATIVE NEGATIVE   Influenza B by PCR NEGATIVE NEGATIVE    Comment: (NOTE) The Xpert Xpress SARS-CoV-2/FLU/RSV plus assay is intended as an aid in the diagnosis of influenza from Nasopharyngeal swab specimens and should not be used as a sole basis for treatment. Nasal washings and aspirates are unacceptable for Xpert Xpress SARS-CoV-2/FLU/RSV testing.  Fact Sheet for Patients: BloggerCourse.com  Fact Sheet for Healthcare Providers: SeriousBroker.it  This test is not yet approved or cleared by the [place] FDA and has been authorized for detection and/or diagnosis of SARS-CoV-2 by FDA under an Emergency Use Authorization (EUA). This EUA will remain in effect (meaning this test can be used) for the duration of the COVID-19 declaration under Section 564(b)(1) of the Act, 21 U.S.C. section 360bbb-3(b)(1), unless the authorization is terminated or revoked.  Performed at [facility] [address] [place], [place] [address]   Occult blood card to lab, stool Provider will collect     Status: None   Collection Time: 07/26/20  1:04 AM  Result Value Ref Range   Fecal Occult Bld NEGATIVE NEGATIVE    Comment: Performed at [facility], [address] [place], [place] [address]  Troponin I (High Sensitivity)     Status: Abnormal   Collection Time: 07/26/20  2:10 AM  Result Value Ref Range   Troponin I (High Sensitivity) 55 (H) <18 ng/L    Comment: (NOTE) Elevated high sensitivity troponin I (hsTnI) values and significant  changes across serial measurements may suggest ACS but many other  chronic and acute conditions are known to elevate hsTnI results.  Refer to the "Links" section for chest pain algorithms and additional  guidance. Performed at [facility], [address] [place], [place] [address]   Magnesium     Status: None   Collection  Time: 07/26/20  5:28 AM  Result Value Ref Range   Magnesium 2.2 1.7 - 2.4 mg/dL    Comment: Performed at [facility], [address] [place], [place] [address]  Basic metabolic panel     Status: Abnormal   Collection Time: 07/26/20  5:28 AM  Result Value Ref Range   Sodium 129 (L) 135 - 145 mmol/L   Potassium 3.1 (L) 3.5 - 5.1 mmol/L   Chloride 90 (L) 98 - 111 mmol/L   CO2 29 22 - 32 mmol/L   Glucose, Bld 123 (H) 70 - 99 mg/dL    Comment: Glucose reference range applies only to samples taken after fasting for at least 8 hours.   BUN 18 8 - 23 mg/dL   Creatinine, Ser 6.04 (H) 0.44 - 1.00 mg/dL   Calcium 7.4 (L) 8.9 - 10.3 mg/dL   GFR, Estimated 41 (L) >60 mL/min    Comment: (NOTE) Calculated using the CKD-EPI Creatinine Equation (2021)    Anion gap 10 5 - 15    Comment: Performed at [facility], [address] [place], [place] [address]  CBC     Status: Abnormal   Collection Time: 07/26/20  5:28 AM  Result Value Ref Range   WBC 4.6 4.0 - 10.5 K/uL   RBC 3.55 (L) 3.87 - 5.11 MIL/uL   Hemoglobin 7.2 (L) 12.0 - 15.0 g/dL    Comment: Reticulocyte Hemoglobin testing may be clinically indicated, consider ordering this additional test [id number]    HCT 24.4 (L) 36.0 - 46.0 %   MCV 68.7 (L) 80.0 - 100.0 fL   MCH 20.3 (L) 26.0 - 34.0 pg   MCHC 29.5 (L) 30.0 - 36.0 g/dL   RDW 95.6 (H) 21.3 - 08.6 %   Platelets 328 150 - 400 K/uL   nRBC 0.0 0.0 - 0.2 %    Comment: Performed at [facility], [address], [place], [place] [address]  Troponin I (High Sensitivity)     Status: Abnormal   Collection Time: 07/26/20  5:28 AM  Result Value Ref Range   Troponin I (High Sensitivity) 54 (H) <18 ng/L    Comment: (NOTE) Elevated high sensitivity troponin I (hsTnI) values and significant  changes across serial measurements may suggest ACS but many other  chronic  and acute conditions are known to elevate hsTnI results.  Refer to the "Links" section for chest pain algorithms and additional  guidance. Performed at [facility], [address], [place], [place] [address]   Type and screen [facility]     Status: None (Preliminary result)   Collection Time: 07/26/20  5:28 AM  Result Value Ref Range   ABO/RH(D) A NEG    Antibody Screen NEG    Sample Expiration 07/29/2020,2359    Unit Number [id number]    Blood Component Type RED CELLS,LR    Unit division 00    Status of Unit ALLOCATED    Transfusion Status OK TO TRANSFUSE    Crossmatch Result Compatible    Unit Number [id number]    Blood Component Type RED CELLS,LR    Unit division 00    Status of Unit ISSUED    Transfusion Status OK TO TRANSFUSE    Crossmatch Result      Compatible Performed at [facility], [address] [place], [place] [address]   Prepare RBC (crossmatch)     Status: None   Collection Time: 07/26/20  5:28 AM  Result Value Ref Range   Order Confirmation      ORDER PROCESSED BY BLOOD BANK Performed at [facility], [address] [place], [place] [address]   Iron and TIBC     Status: Abnormal   Collection Time: 07/26/20  5:28 AM  Result Value Ref Range   Iron 34 28 - 170 ug/dL   TIBC 338 (H) 250 - 539 ug/dL   Saturation Ratios 7 (L) 10.4 - 31.8 %   UIBC 436 ug/dL    Comment: Performed at [facility], [address], [place], [place] [address]  Ferritin     Status: None   Collection Time: 07/26/20  5:28 AM  Result Value Ref Range   Ferritin 26 11 - 307 ng/mL    Comment: Performed at [facility], [address], [place], [place] [address]  TSH     Status: Abnormal   Collection Time: 07/26/20  5:34 AM  Result Value Ref Range   TSH 25.065 (H) 0.350 - 4.500 uIU/mL    Comment: Performed by a 3rd Generation assay with a functional sensitivity of  <=0.01 uIU/mL. Performed at [facility], [address], [place], [place] [address]   ABO/Rh     Status: None   Collection Time: 07/26/20  6:41 AM  Result Value Ref Range   ABO/RH(D)      A NEG Performed at [facility], [address] [place], [place] [address]   Troponin I (High Sensitivity)     Status: Abnormal   Collection Time: 07/26/20  6:41 AM  Result Value Ref Range   Troponin I (High Sensitivity) 55 (H) <18 ng/L    Comment: (NOTE) Elevated high sensitivity troponin I (hsTnI) values and significant  changes across serial measurements may suggest ACS but many other  chronic and acute conditions are known to elevate hsTnI results.  Refer to the "Links" section for chest pain algorithms and additional  guidance. Performed at [facility], [address], [place], [place] [address]   CK     Status: Abnormal   Collection Time: 07/26/20  6:50 AM  Result Value Ref Range   Total CK 500 (H) 38 - 234 U/L    Comment: Performed at [facility], [address] [place], [place] [address]  Glucose, capillary     Status: Abnormal   Collection Time: 07/26/20  7:25 AM  Result Value Ref Range   Glucose-Capillary 113 (H) 70 - 99 mg/dL    Comment: Glucose reference range applies only to samples taken after fasting for at least 8 hours.    DG Chest 2 View  Result Date: 07/25/2020 CLINICAL DATA:  Multiple falls.  Cough. EXAM: CHEST - 2 VIEW COMPARISON:  01/10/2020 FINDINGS: Cardiac pacemaker. Shallow inspiration. Heart size and pulmonary vascularity are normal for technique. Lungs are clear. No pleural effusions. No pneumothorax. Calcification of the aorta. Anterior dislocation of the right shoulder is new since prior study. IMPRESSION: Shallow inspiration. No evidence of active pulmonary disease. Anterior dislocation of the right shoulder. Electronically Signed   By: Burman Nieves M.D.   On: 07/25/2020 23:21    CT Head Wo Contrast  Result Date: 07/25/2020 CLINICAL DATA:  Multiple falls with confusion over the past few weeks. EXAM: CT HEAD WITHOUT CONTRAST TECHNIQUE: Contiguous axial images were obtained from the base of the skull through the vertex without intravenous contrast. COMPARISON:  07/14/2020 FINDINGS: Brain: No  evidence of acute infarction, hemorrhage, hydrocephalus, extra-axial collection or mass lesion/mass effect. Vascular: No hyperdense vessel or unexpected calcification. Skull: Calvarium appears intact. Sinuses/Orbits: Paranasal sinuses and mastoid air cells are clear. Other: None.  No significant changes since prior study. IMPRESSION: No acute intracranial abnormalities. Electronically Signed   By: Burman Nieves M.D.   On: 07/25/2020 23:10   CT Shoulder Right Wo Contrast  Result Date: 07/26/2020 CLINICAL DATA:  Shoulder dislocation and reduction attempt EXAM: CT OF THE UPPER RIGHT EXTREMITY WITHOUT CONTRAST TECHNIQUE: Multidetector CT imaging of the upper right extremity was performed according to the standard protocol. COMPARISON:  None. FINDINGS: Bones/Joint/Cartilage Anterior dislocation of the glenohumeral joint. There is a small fracture fragment displaced from the coracoid process. Ligaments Suboptimally assessed by CT. Muscles and Tendons Unremarkable Soft tissues Soft tissue swelling. IMPRESSION: Anterior dislocation of the glenohumeral joint with small fracture fragment displaced from the coracoid process. Electronically Signed   By: Deatra Robinson M.D.   On: 07/26/2020 02:20   DG Shoulder Right Port  Result Date: 07/26/2020 CLINICAL DATA:  Multiple falls. Right shoulder dislocation on chest radiograph. EXAM: PORTABLE RIGHT SHOULDER COMPARISON:  Right humerus 07/14/2020 FINDINGS: Anterior dislocation of the right shoulder with anterior and inferior displacement of the humeral head with respect to the glenoid. No associated fractures are demonstrated. Acromioclavicular joint is intact.  Changes are new since previous study. IMPRESSION: Anterior dislocation of the right shoulder. Electronically Signed   By: Burman Nieves M.D.   On: 07/26/2020 00:07     Vitals Temp:  [97.5 F (36.4 C)-98.6 F (37 C)] 97.5 F (36.4 C) (07/03 0855) Pulse Rate:  [25-79] 58 (07/03 0855) Resp:  [14-22] 14 (07/03 0855) BP: (100-164)/(50-129) 105/53 (07/03 0855) SpO2:  [91 %-100 %] 98 % (07/03 0855) Weight:  [90.7 kg] 90.7 kg (07/02 2209) Body mass index is 39.06 kg/m.  Physical Exam: Patient drowsy, resting comfortably in her hospital bed receiving transfusion.  Easily aroused.  Reports minimal right shoulder pain at this time.  Does report significant right shoulder and arm pain with attempts at motion.  Inspection of the right shoulder demonstrates diffuse swelling extending from the shoulder into the entire right upper extremity.  2-3+ edema in the forearm wrist and hand.  She allows passive supination and pronation of the forearm with minimal discomfort.  EPL, APB, and first dorsal interossei are slightly decreased in strength at 4 out of 5 secondary to pain.  She is grossly neurovascular intact.  She is intact to light touch sensation in the axillary nerve distribution.  She has severe pain with any attempts of right shoulder motion.  Plain films of the right shoulder demonstrate a fixed anterior inferior dislocation with a small fracture of the tip of the coracoid.  CT scan of the right shoulder confirms the anterior inferior dislocation.     Assessment/Plan: Impression: Chronic right shoulder dislocation Treatment: I have counseled Jillian Leblanc regarding today's findings.  Given the chronicity of her shoulder dislocation with the likely compromise of the rotator cuff tissues I would recommend a reverse shoulder arthroplasty as definitive management.  We reviewed the technical details as well as the potential risks versus benefits thereof.  Possible surgical complications were reviewed  including potential for bleeding, infection, neurovascular injury, persistent pain, loss of motion, anesthetic complication, failure of the implant, and possible need for additional surgery.  She understands, and accepts, and agrees to plan procedure.  We will tentatively plan for surgery Tuesday afternoon.  N.p.o. after midnight Monday.  Donnice Nielsen M Dannelle Rhymes 07/26/2020, 10:54 AM  Contact # [phone]

## 2020-07-26 NOTE — Sedation Documentation (Signed)
Unable to rate pain during procedure 

## 2020-07-27 LAB — BPAM RBC
Blood Product Expiration Date: 202208092359
Blood Product Expiration Date: 202208102359
ISSUE DATE / TIME: 202207030814
ISSUE DATE / TIME: 202207031200
Unit Type and Rh: 600
Unit Type and Rh: 600

## 2020-07-27 LAB — CBC
HCT: 29.2 % — ABNORMAL LOW (ref 36.0–46.0)
Hemoglobin: 8.8 g/dL — ABNORMAL LOW (ref 12.0–15.0)
MCH: 22.4 pg — ABNORMAL LOW (ref 26.0–34.0)
MCHC: 30.1 g/dL (ref 30.0–36.0)
MCV: 74.5 fL — ABNORMAL LOW (ref 80.0–100.0)
Platelets: 287 10*3/uL (ref 150–400)
RBC: 3.92 MIL/uL (ref 3.87–5.11)
RDW: 22.8 % — ABNORMAL HIGH (ref 11.5–15.5)
WBC: 4.8 10*3/uL (ref 4.0–10.5)
nRBC: 0 % (ref 0.0–0.2)

## 2020-07-27 LAB — TYPE AND SCREEN
ABO/RH(D): A NEG
Antibody Screen: NEGATIVE
Unit division: 0
Unit division: 0

## 2020-07-27 LAB — GLUCOSE, CAPILLARY
Glucose-Capillary: 162 mg/dL — ABNORMAL HIGH (ref 70–99)
Glucose-Capillary: 108 mg/dL — ABNORMAL HIGH (ref 70–99)
Glucose-Capillary: 76 mg/dL (ref 70–99)
Glucose-Capillary: 142 mg/dL — ABNORMAL HIGH (ref 70–99)

## 2020-07-27 LAB — BASIC METABOLIC PANEL
Anion gap: 9 (ref 5–15)
BUN: 14 mg/dL (ref 8–23)
CO2: 27 mmol/L (ref 22–32)
Calcium: 7.4 mg/dL — ABNORMAL LOW (ref 8.9–10.3)
Chloride: 96 mmol/L — ABNORMAL LOW (ref 98–111)
Creatinine, Ser: 1.23 mg/dL — ABNORMAL HIGH (ref 0.44–1.00)
GFR, Estimated: 46 mL/min — ABNORMAL LOW (ref >60)
Glucose, Bld: 138 mg/dL — ABNORMAL HIGH (ref 70–99)
Potassium: 3.9 mmol/L (ref 3.5–5.1)
Sodium: 132 mmol/L — ABNORMAL LOW (ref 135–145)

## 2020-07-27 LAB — MAGNESIUM: Magnesium: 2.2 mg/dL (ref 1.7–2.4)

## 2020-07-27 MED ORDER — CIPROFLOXACIN HCL 500 MG PO TABS
500.0000 mg | ORAL_TABLET | Freq: Two times a day (BID) | ORAL | Status: DC
  Administered 2020-07-27 – 2020-07-29 (×4): 500 mg via ORAL
  Filled 2020-07-27 (×4): qty 1

## 2020-07-27 MED ORDER — SODIUM CHLORIDE 0.9 % IV SOLN
1.0000 g | INTRAVENOUS | Status: DC
  Administered 2020-07-27: 1 g via INTRAVENOUS
  Filled 2020-07-27: qty 1

## 2020-07-27 MED ORDER — SODIUM CHLORIDE 0.9 % IV SOLN: INTRAVENOUS | Status: DC

## 2020-07-27 NOTE — Evaluation (Signed)
Physical Therapy Evaluation Patient Details Name: Jillian Leblanc MRN: [id number] DOB: 09/15/1945 Today's Date: 07/27/2020   History of Present Illness  [name] 75 y.o. female present to ER for anemia, dehydration, recurrent falls and Rt shoulder dislocation and fracture of the coracoid tip. PMH significant for DM2, anemia, CKD stage IIIa, hypothyroidism, symptomatic bradycardia with implanted Medtronic pacemaker.    Clinical Impression  Jillian Leblanc is 75 y.o. female admitted with above HPI and diagnosis. [name] currently limited by functional impairments below (see PT problem list). Patient lives with her husband and is independent with RW at baseline. [name] now NWB on Rt UE due to fall and shoulder dislocation/UE fracture. Pt currently requires min-mod assist for mobility and is planning for surgery tomorrow. Patient will benefit from continued skilled PT interventions to address impairments and progress independence with mobility, recommending short term rehab at SNF vs return home with assist from family pending progress with therapy. Acute PT will follow and progress as able.     Follow Up Recommendations SNF    Equipment Recommendations  Other (comment) (TBA, hemi-walker)    Recommendations for Other Services       Precautions / Restrictions Precautions Precautions: Shoulder Required Braces or Orthoses: Sling Restrictions Weight Bearing Restrictions: Yes RUE Weight Bearing: Non weight bearing      Mobility  Bed Mobility Overal bed mobility: Needs Assistance Bed Mobility: Supine to Sit     Supine to sit: Mod assist;HOB elevated     General bed mobility comments: cues to maintain NWB and avoid pressure on Rt UE. pt using Lt UE with bed rail to raise trunk and able to bring bil LE's towards EOB. Mod assist to fully sit up to EOB.    Transfers Overall transfer level: Needs assistance Equipment used: 1 person hand held assist Transfers: Sit to/from  Stand Sit to Stand: Min assist         General transfer comment: cues for HHA to rise from EOB, pt reliant on support for power up and to steady; slight posterior lean in standing.  Ambulation/Gait Ambulation/Gait assistance: Min assist Gait Distance (Feet): 7 Feet Assistive device: 1 person hand held assist Gait Pattern/deviations: Step-through pattern;Decreased step length - right;Decreased step length - left;Decreased stride length;Shuffle;Wide base of support;Drifts right/left Gait velocity: decr   General Gait Details: pt unsteady and min HHA provided on Lt UE for support. pt required cues to turn and safely sit in recliner. Assist to slow/control lowering to chair after several short steps forward and turning.  Stairs            Wheelchair Mobility    Modified Rankin (Stroke Patients Only)       Balance Overall balance assessment: Needs assistance;History of Falls Sitting-balance support: Feet supported Sitting balance-Leahy Scale: Fair     Standing balance support: During functional activity;Single extremity supported Standing balance-Leahy Scale: Poor Standing balance comment: reliant on external support                             Pertinent Vitals/Pain Pain Assessment: 0-10 Pain Score: 10-Worst pain ever Pain Location: Rt shoulder Pain Descriptors / Indicators: Discomfort;Grimacing;Guarding Pain Intervention(s): Limited activity within patient's tolerance;Monitored during session;Repositioned;Ice applied;Patient requesting pain meds-RN notified    Home Living Family/patient expects to be discharged to:: Private residence Living Arrangements: Spouse/significant other Available Help at Discharge: Family Type of Home: House Home Access: Stairs to enter Entrance Stairs-Rails: Right (going in) Secretary/administrator  of Steps: 1+1 Home Layout: Two level;Full bath on main level;Able to live on main level with bedroom/bathroom Home Equipment:  Dan Humphreys - 2 wheels;Cane - quad;Grab bars - tub/shower;Grab bars - toilet;Shower seat;Hand held shower head      Prior Function Level of Independence: Independent with assistive device(s)         Comments: RW to ambulate     Hand Dominance   Dominant Hand: Right    Extremity/Trunk Assessment   Upper Extremity Assessment Upper Extremity Assessment: RUE deficits/detail;Defer to OT evaluation RUE: Unable to fully assess due to immobilization;Unable to fully assess due to pain    Lower Extremity Assessment Lower Extremity Assessment: Generalized weakness    Cervical / Trunk Assessment Cervical / Trunk Assessment: Kyphotic  Communication   Communication: No difficulties  Cognition Arousal/Alertness: Awake/alert Behavior During Therapy: WFL for tasks assessed/performed Overall Cognitive Status: Within Functional Limits for tasks assessed                                        General Comments      Exercises     Assessment/Plan    PT Assessment Patient needs continued PT services  PT Problem List Decreased strength;Decreased range of motion;Decreased activity tolerance;Decreased balance;Decreased knowledge of use of DME;Decreased safety awareness;Decreased knowledge of precautions;Other (comment);Pain       PT Treatment Interventions DME instruction;Gait training;Stair training;Functional mobility training;Therapeutic activities;Therapeutic exercise;Balance training;Patient/family education    PT Goals (Current goals can be found in the Care Plan section)  Acute Rehab PT Goals Patient Stated Goal: get back home PT Goal Formulation: With patient Time For Goal Achievement: 08/10/20 Potential to Achieve Goals: Good    Frequency Min 5X/week   Barriers to discharge        Co-evaluation               AM-PAC PT "6 Clicks" Mobility  Outcome Measure Help needed turning from your back to your side while in a flat bed without using bedrails?: A  Lot Help needed moving from lying on your back to sitting on the side of a flat bed without using bedrails?: A Lot Help needed moving to and from a bed to a chair (including a wheelchair)?: A Lot Help needed standing up from a chair using your arms (e.g., wheelchair or bedside chair)?: A Little Help needed to walk in hospital room?: A Little Help needed climbing 3-5 steps with a railing? : A Lot 6 Click Score: 14    End of Session Equipment Utilized During Treatment: Gait belt;Other (comment) (sling Rt UE) Activity Tolerance: Patient tolerated treatment well;Patient limited by pain Patient left: in chair;with call bell/phone within reach;with chair alarm set Nurse Communication: Mobility status PT Visit Diagnosis: History of falling (Z91.81);Muscle weakness (generalized) (M62.81);Difficulty in walking, not elsewhere classified (R26.2);Pain Pain - Right/Left: Right Pain - part of body: Shoulder;Arm    Time: 5456-2563 PT Time Calculation (min) (ACUTE ONLY): 35 min   Charges:   PT Evaluation $PT Eval Moderate Complexity: 1 Mod PT Treatments $Gait Training: 8-22 mins        Wynn Maudlin, DPT Acute Rehabilitation Services Office [phone] Pager [phone]   Anitra Lauth 07/27/2020, 2:09 PM

## 2020-07-27 NOTE — Progress Notes (Signed)
PROGRESS NOTE    Jillian Leblanc  RUE:[id number] DOB: 04-15-1945 DOA: 07/25/2020 PCP: [name], Moogali M, MD   Brief Narrative:  75 year old with history of DM2, anemia, CKD stage IIIa, hypothyroidism admitted to the hospital for recurrent falls.  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  In the ER she was found to be anemic with hemoglobin of 6.9 received 1 unit PRBC transfusion.  Signs of dehydration and right shoulder dislocation.  Attempts for right shoulder dislocation were unsuccessful in the ED therefore orthopedic was consulted.  Found to be in iron deficiency anemia therefore received IV iron with p.o. supplements.  GI recommends conservative management for now.   Assessment & Plan:   Principal Problem:   Symptomatic anemia Active Problems:   Recurrent falls   Acute kidney injury superimposed on chronic kidney disease (HCC)   Hyponatremia   Hypokalemia   Dislocated shoulder, right, initial encounter   Closed displaced fracture of coracoid process of right shoulder   Diabetes mellitus type 2 in obese (HCC)   Elevated troponin  Symptomatic anemia, iron deficiency - Admission hemoglobin 6.9.  Baseline hemoglobin 8.0.  Status post 2 units PRBC transfusion.  Hemoglobin 8.8 - Seen by GI-conservative management.  PPI twice daily - IV iron 7/3.  Continue p.o. with bowel regimen  Hyponatremia/hypokalemia Mild dehydration - Improved continue gentle hydration  Mild rhabdomyolysis secondary to falls - Gentle hydration  Right shoulder dislocation - Plans for or per orthopedic  Recurrent falls - Echocardiogram 2021-EF 55 to 60%, grade 1 DD. - PT/OT-pending - Suspect from generalized weakness from dehydration and symptomatic anemia  UTI -IV Rocephin for 5 days.   Elevated troponin - Chronic elevation.  No signs of acute ischemia on  Hypothyroidism with elevated TSH - TSH 25, T4 normal.  Continue Synthroid.  Repeat in 3-4  weeks  Diabetes mellitus type 2 Peripheral neuropathy - Hold off on metformin.  Lantus 10 units daily, Accu-Cheks and sliding scale - Gabapentin  DVT prophylaxis: SCDs Start: 07/26/20 0451 Code Status: Full Family Communication: Daughter updated   Dispo: The patient is from: Home              Anticipated d/c is to: Home              Patient currently is not medically stable to d/c.  Ongoing multiple medical issues including evaluation for symptomatic anemia, right shoulder dislocation, dehydration, rhabdomyolysis.  Thereafter she needs PT/OT.  Unsafe for discharge at this time.  Patient should remain in the hospital least for next 24-48 hours.   Difficult to place patient No   Subjective: No bleeding noted. Feels ok.   Review of Systems Otherwise negative except as per HPI, including: General = no fevers, chills, dizziness,  fatigue HEENT/EYES = negative for loss of vision, double vision, blurred vision,  sore throa Cardiovascular= negative for chest pain, palpitation Respiratory/lungs= negative for shortness of breath, cough, wheezing; hemoptysis,  Gastrointestinal= negative for nausea, vomiting, abdominal pain Genitourinary= negative for Dysuria MSK = Negative for arthralgia, myalgias Neurology= Negative for headache, numbness, tingling  Psychiatry= Negative for suicidal and homocidal ideation Skin= Negative for Rash   Examination: Constitutional: Not in acute distress Respiratory: Clear to auscultation bilaterally Cardiovascular: Normal sinus rhythm, no rubs Abdomen: Nontender nondistended good bowel sounds Musculoskeletal: Limited ROM Skin: No rashes seen Neurologic: CN 2-12 grossly intact.  And nonfocal Psychiatric: Normal judgment and insight. Alert and oriented x 3. Normal mood.      Objective: Vitals:  07/26/20 1535 07/26/20 1607 07/26/20 2007 07/27/20 0441  BP: (!) 117/94 (!) 109/43 127/66 140/65  Pulse: (!) 59 66 77 79  Resp: 16 14 20 20   Temp: (!)  97.4 F (36.3 C) 98.3 F (36.8 C) 98.2 F (36.8 C) 98.6 F (37 C)  TempSrc: Oral Oral Oral Oral  SpO2: 96% 98% 95% 94%  Weight:      Height:        Intake/Output Summary (Last 24 hours) at 07/27/2020 0842 Last data filed at 07/26/2020 1700 Gross per 24 hour  Intake 1158 ml  Output 800 ml  Net 358 ml   Filed Weights   07/25/20 2209  Weight: 90.7 kg     Data Reviewed:   CBC: Recent Labs  Lab 07/25/20 2242 07/26/20 0528 07/27/20 0547  WBC 6.1 4.6 4.8  NEUTROABS 4.7  --   --   HGB 6.9* 7.2* 8.8*  HCT 22.3* 24.4* 29.2*  MCV 66.4* 68.7* 74.5*  PLT 347 328 287   Basic Metabolic Panel: Recent Labs  Lab 07/25/20 2242 07/26/20 0528 07/27/20 0547  NA 125* 129* 132*  K 3.0* 3.1* 3.9  CL 85* 90* 96*  CO2 29 29 27   GLUCOSE 200* 123* 138*  BUN 20 18 14   CREATININE 1.48* 1.36* 1.23*  CALCIUM 7.0* 7.4* 7.4*  MG  --  2.2 2.2   GFR: Estimated Creatinine Clearance: 40.9 mL/min (A) (by C-G formula based on SCr of 1.23 mg/dL (H)). Liver Function Tests: Recent Labs  Lab 07/25/20 2242  AST 26  ALT 22  ALKPHOS 65  BILITOT 0.7  PROT 7.1  ALBUMIN 3.2*   No results for input(s): LIPASE, AMYLASE in the last 168 hours. No results for input(s): AMMONIA in the last 168 hours. Coagulation Profile: No results for input(s): INR, PROTIME in the last 168 hours. Cardiac Enzymes: Recent Labs  Lab 07/26/20 0650  CKTOTAL 500*   BNP (last 3 results) No results for input(s): PROBNP in the last 8760 hours. HbA1C: No results for input(s): HGBA1C in the last 72 hours. CBG: Recent Labs  Lab 07/26/20 0725 07/26/20 1147 07/26/20 1626 07/26/20 2017 07/27/20 0731  GLUCAP 113* 108* 132* 151* 162*   Lipid Profile: No results for input(s): CHOL, HDL, LDLCALC, TRIG, CHOLHDL, LDLDIRECT in the last 72 hours. Thyroid Function Tests: Recent Labs    07/26/20 0534  TSH 25.065*  FREET4 0.76   Anemia Panel: Recent Labs    07/26/20 0528  FERRITIN 26  TIBC 470*  IRON 34    Sepsis Labs: No results for input(s): PROCALCITON, LATICACIDVEN in the last 168 hours.  Recent Results (from the past 240 hour(s))  Resp Panel by RT-PCR (Flu A&B, Covid) Nasopharyngeal Swab     Status: None   Collection Time: 07/25/20 10:42 PM   Specimen: Nasopharyngeal Swab; Nasopharyngeal(NP) swabs in vial transport medium  Result Value Ref Range Status   SARS Coronavirus 2 by RT PCR NEGATIVE NEGATIVE Final    Comment: (NOTE) SARS-CoV-2 target nucleic acids are NOT DETECTED.  The SARS-CoV-2 RNA is generally detectable in upper respiratory specimens during the acute phase of infection. The lowest concentration of SARS-CoV-2 viral copies this assay can detect is 138 copies/mL. A negative result does not preclude SARS-Cov-2 infection and should not be used as the sole basis for treatment or other patient management decisions. A negative result may occur with  improper specimen collection/handling, submission of specimen other than nasopharyngeal swab, presence of viral mutation(s) within the areas targeted by this assay,  and inadequate number of viral copies(<138 copies/mL). A negative result must be combined with clinical observations, patient history, and epidemiological information. The expected result is Negative.  Fact Sheet for Patients:  BloggerCourse.com  Fact Sheet for Healthcare Providers:  SeriousBroker.it  This test is no t yet approved or cleared by the [place] FDA and  has been authorized for detection and/or diagnosis of SARS-CoV-2 by FDA under an Emergency Use Authorization (EUA). This EUA will remain  in effect (meaning this test can be used) for the duration of the COVID-19 declaration under Section 564(b)(1) of the Act, 21 U.S.C.section 360bbb-3(b)(1), unless the authorization is terminated  or revoked sooner.       Influenza A by PCR NEGATIVE NEGATIVE Final   Influenza B by PCR NEGATIVE NEGATIVE  Final    Comment: (NOTE) The Xpert Xpress SARS-CoV-2/FLU/RSV plus assay is intended as an aid in the diagnosis of influenza from Nasopharyngeal swab specimens and should not be used as a sole basis for treatment. Nasal washings and aspirates are unacceptable for Xpert Xpress SARS-CoV-2/FLU/RSV testing.  Fact Sheet for Patients: BloggerCourse.com  Fact Sheet for Healthcare Providers: SeriousBroker.it  This test is not yet approved or cleared by the [place] FDA and has been authorized for detection and/or diagnosis of SARS-CoV-2 by FDA under an Emergency Use Authorization (EUA). This EUA will remain in effect (meaning this test can be used) for the duration of the COVID-19 declaration under Section 564(b)(1) of the Act, 21 U.S.C. section 360bbb-3(b)(1), unless the authorization is terminated or revoked.  Performed at [facility], [address], [place], [place] [address]          Radiology Studies: DG Chest 2 View  Result Date: 07/25/2020 CLINICAL DATA:  Multiple falls.  Cough. EXAM: CHEST - 2 VIEW COMPARISON:  01/10/2020 FINDINGS: Cardiac pacemaker. Shallow inspiration. Heart size and pulmonary vascularity are normal for technique. Lungs are clear. No pleural effusions. No pneumothorax. Calcification of the aorta. Anterior dislocation of the right shoulder is new since prior study. IMPRESSION: Shallow inspiration. No evidence of active pulmonary disease. Anterior dislocation of the right shoulder. Electronically Signed   By: Burman Nieves M.D.   On: 07/25/2020 23:21   CT Head Wo Contrast  Result Date: 07/25/2020 CLINICAL DATA:  Multiple falls with confusion over the past few weeks. EXAM: CT HEAD WITHOUT CONTRAST TECHNIQUE: Contiguous axial images were obtained from the base of the skull through the vertex without intravenous contrast. COMPARISON:  07/14/2020 FINDINGS: Brain: No evidence of acute infarction,  hemorrhage, hydrocephalus, extra-axial collection or mass lesion/mass effect. Vascular: No hyperdense vessel or unexpected calcification. Skull: Calvarium appears intact. Sinuses/Orbits: Paranasal sinuses and mastoid air cells are clear. Other: None.  No significant changes since prior study. IMPRESSION: No acute intracranial abnormalities. Electronically Signed   By: Burman Nieves M.D.   On: 07/25/2020 23:10   CT Shoulder Right Wo Contrast  Result Date: 07/26/2020 CLINICAL DATA:  Shoulder dislocation and reduction attempt EXAM: CT OF THE UPPER RIGHT EXTREMITY WITHOUT CONTRAST TECHNIQUE: Multidetector CT imaging of the upper right extremity was performed according to the standard protocol. COMPARISON:  None. FINDINGS: Bones/Joint/Cartilage Anterior dislocation of the glenohumeral joint. There is a small fracture fragment displaced from the coracoid process. Ligaments Suboptimally assessed by CT. Muscles and Tendons Unremarkable Soft tissues Soft tissue swelling. IMPRESSION: Anterior dislocation of the glenohumeral joint with small fracture fragment displaced from the coracoid process. Electronically Signed   By: Deatra Robinson M.D.   On: 07/26/2020  02:20   DG Shoulder Right Port  Result Date: 07/26/2020 CLINICAL DATA:  Multiple falls. Right shoulder dislocation on chest radiograph. EXAM: PORTABLE RIGHT SHOULDER COMPARISON:  Right humerus 07/14/2020 FINDINGS: Anterior dislocation of the right shoulder with anterior and inferior displacement of the humeral head with respect to the glenoid. No associated fractures are demonstrated. Acromioclavicular joint is intact. Changes are new since previous study. IMPRESSION: Anterior dislocation of the right shoulder. Electronically Signed   By: Burman Nieves M.D.   On: 07/26/2020 00:07        Scheduled Meds:  ferrous sulfate  325 mg Oral BID WC   insulin aspart  0-15 Units Subcutaneous TID WC   insulin aspart  0-5 Units Subcutaneous QHS   insulin glargine   10 Units Subcutaneous Daily   levothyroxine  125 mcg Oral Q0600   oxybutynin  10 mg Oral Daily   pantoprazole  40 mg Oral BID AC   senna-docusate  2 tablet Oral QHS   Continuous Infusions:  [START ON 07/28/2020]  ceFAZolin (ANCEF) IV     promethazine (PHENERGAN) injection (IM or IVPB)     [START ON 07/28/2020] tranexamic acid       LOS: 1 day   Time spent= 35 mins    Bracy Pepper Joline Maxcy, MD [facility]  If 7PM-7AM, please contact night-coverage  07/27/2020, 8:42 AM

## 2020-07-28 ENCOUNTER — Encounter (HOSPITAL_COMMUNITY): Payer: Self-pay | Admitting: Internal Medicine

## 2020-07-28 ENCOUNTER — Encounter (HOSPITAL_COMMUNITY)
Admission: EM | Disposition: A | Discharge status: Home Health | Payer: Self-pay | Source: Home / Self Care | Attending: Internal Medicine

## 2020-07-28 ENCOUNTER — Inpatient Hospital Stay (HOSPITAL_COMMUNITY): Payer: Medicare Other | Admitting: Anesthesiology

## 2020-07-28 HISTORY — PX: REVERSE SHOULDER ARTHROPLASTY: SHX5054

## 2020-07-28 LAB — BASIC METABOLIC PANEL
Anion gap: 9 (ref 5–15)
BUN: 13 mg/dL (ref 8–23)
CO2: 25 mmol/L (ref 22–32)
Calcium: 7.8 mg/dL — ABNORMAL LOW (ref 8.9–10.3)
Chloride: 96 mmol/L — ABNORMAL LOW (ref 98–111)
Creatinine, Ser: 1.32 mg/dL — ABNORMAL HIGH (ref 0.44–1.00)
GFR, Estimated: 43 mL/min — ABNORMAL LOW (ref >60)
Glucose, Bld: 118 mg/dL — ABNORMAL HIGH (ref 70–99)
Potassium: 4.1 mmol/L (ref 3.5–5.1)
Sodium: 130 mmol/L — ABNORMAL LOW (ref 135–145)

## 2020-07-28 LAB — GLUCOSE, CAPILLARY
Glucose-Capillary: 129 mg/dL — ABNORMAL HIGH (ref 70–99)
Glucose-Capillary: 121 mg/dL — ABNORMAL HIGH (ref 70–99)
Glucose-Capillary: 103 mg/dL — ABNORMAL HIGH (ref 70–99)
Glucose-Capillary: 119 mg/dL — ABNORMAL HIGH (ref 70–99)
Glucose-Capillary: 178 mg/dL — ABNORMAL HIGH (ref 70–99)

## 2020-07-28 LAB — CBC
HCT: 31.6 % — ABNORMAL LOW (ref 36.0–46.0)
Hemoglobin: 9.4 g/dL — ABNORMAL LOW (ref 12.0–15.0)
MCH: 22.3 pg — ABNORMAL LOW (ref 26.0–34.0)
MCHC: 29.7 g/dL — ABNORMAL LOW (ref 30.0–36.0)
MCV: 75.1 fL — ABNORMAL LOW (ref 80.0–100.0)
Platelets: 302 10*3/uL (ref 150–400)
RBC: 4.21 MIL/uL (ref 3.87–5.11)
RDW: 23.2 % — ABNORMAL HIGH (ref 11.5–15.5)
WBC: 5.9 10*3/uL (ref 4.0–10.5)
nRBC: 0 % (ref 0.0–0.2)

## 2020-07-28 LAB — MAGNESIUM: Magnesium: 2.1 mg/dL (ref 1.7–2.4)

## 2020-07-28 LAB — HEMOGLOBIN A1C
Hgb A1c MFr Bld: 7.3 % — ABNORMAL HIGH (ref 4.8–5.6)
Mean Plasma Glucose: 163 mg/dL

## 2020-07-28 LAB — MRSA NEXT GEN BY PCR, NASAL: MRSA by PCR Next Gen: DETECTED — AB

## 2020-07-28 SURGERY — ARTHROPLASTY, SHOULDER, TOTAL, REVERSE
Anesthesia: General | Site: Shoulder | Laterality: Right

## 2020-07-28 MED ORDER — FENTANYL CITRATE (PF) 100 MCG/2ML IJ SOLN
50.0000 ug | INTRAMUSCULAR | Status: DC
Start: 2020-07-28 — End: 2020-07-28

## 2020-07-28 MED ORDER — ONDANSETRON HCL 4 MG/2ML IJ SOLN
4.0000 mg | Freq: Once | INTRAMUSCULAR | Status: DC | PRN
Start: 2020-07-28 — End: 2020-07-28

## 2020-07-28 MED ORDER — LACTATED RINGERS IV SOLN: INTRAVENOUS | Status: DC

## 2020-07-28 MED ORDER — ONDANSETRON HCL 4 MG/2ML IJ SOLN
INTRAMUSCULAR | Status: DC | PRN
  Administered 2020-07-28: 4 mg via INTRAVENOUS

## 2020-07-28 MED ORDER — VANCOMYCIN HCL 1000 MG/200ML IV SOLN: 1000.0000 mg | INTRAVENOUS | Status: DC

## 2020-07-28 MED ORDER — MUPIROCIN 2 % EX OINT
TOPICAL_OINTMENT | Freq: Two times a day (BID) | CUTANEOUS | Status: DC
  Filled 2020-07-28 (×2): qty 22

## 2020-07-28 MED ORDER — VANCOMYCIN HCL 1000 MG IV SOLR
INTRAVENOUS | Status: DC | PRN
  Administered 2020-07-28: 1000 mg via TOPICAL

## 2020-07-28 MED ORDER — STERILE WATER FOR IRRIGATION IR SOLN
Status: DC | PRN
  Administered 2020-07-28: 2000 mL

## 2020-07-28 MED ORDER — PROPOFOL 10 MG/ML IV BOLUS
INTRAVENOUS | Status: DC | PRN
  Administered 2020-07-28: 110 mg via INTRAVENOUS

## 2020-07-28 MED ORDER — PHENYLEPHRINE HCL (PRESSORS) 10 MG/ML IV SOLN
INTRAVENOUS | Status: AC
  Filled 2020-07-28: qty 1

## 2020-07-28 MED ORDER — DEXAMETHASONE SODIUM PHOSPHATE 10 MG/ML IJ SOLN
INTRAMUSCULAR | Status: DC | PRN
  Administered 2020-07-28: 4 mg via INTRAVENOUS

## 2020-07-28 MED ORDER — MIDAZOLAM HCL 2 MG/2ML IJ SOLN
INTRAMUSCULAR | Status: AC
  Filled 2020-07-28: qty 2

## 2020-07-28 MED ORDER — PHENYLEPHRINE HCL-NACL 10-0.9 MG/250ML-% IV SOLN
INTRAVENOUS | Status: DC | PRN
  Administered 2020-07-28: 20 ug/min via INTRAVENOUS

## 2020-07-28 MED ORDER — BUPIVACAINE LIPOSOME 1.3 % IJ SUSP
INTRAMUSCULAR | Status: DC | PRN
  Administered 2020-07-28: 10 mL via PERINEURAL

## 2020-07-28 MED ORDER — PROPOFOL 10 MG/ML IV BOLUS
INTRAVENOUS | Status: AC
  Filled 2020-07-28: qty 20

## 2020-07-28 MED ORDER — BUPIVACAINE HCL (PF) 0.5 % IJ SOLN
INTRAMUSCULAR | Status: DC | PRN
  Administered 2020-07-28: 17 mL via PERINEURAL

## 2020-07-28 MED ORDER — SUGAMMADEX SODIUM 200 MG/2ML IV SOLN
INTRAVENOUS | Status: DC | PRN
  Administered 2020-07-28: 200 mg via INTRAVENOUS

## 2020-07-28 MED ORDER — PHENYLEPHRINE 40 MCG/ML (10ML) SYRINGE FOR IV PUSH (FOR BLOOD PRESSURE SUPPORT)
PREFILLED_SYRINGE | INTRAVENOUS | Status: DC | PRN
  Administered 2020-07-28: 120 ug via INTRAVENOUS

## 2020-07-28 MED ORDER — VANCOMYCIN HCL IN DEXTROSE 1-5 GM/200ML-% IV SOLN
INTRAVENOUS | Status: AC
  Administered 2020-07-28: 1000 mg
  Filled 2020-07-28: qty 200

## 2020-07-28 MED ORDER — ROCURONIUM BROMIDE 10 MG/ML (PF) SYRINGE
PREFILLED_SYRINGE | INTRAVENOUS | Status: DC | PRN
  Administered 2020-07-28: 70 mg via INTRAVENOUS

## 2020-07-28 MED ORDER — MIDAZOLAM HCL 2 MG/2ML IJ SOLN: 1.0000 mg | INTRAMUSCULAR | Status: DC

## 2020-07-28 MED ORDER — 0.9 % SODIUM CHLORIDE (POUR BTL) OPTIME
TOPICAL | Status: DC | PRN
  Administered 2020-07-28: 1000 mL

## 2020-07-28 MED ORDER — LIDOCAINE 2% (20 MG/ML) 5 ML SYRINGE
INTRAMUSCULAR | Status: DC | PRN
  Administered 2020-07-28: 100 mg via INTRAVENOUS

## 2020-07-28 MED ORDER — LIDOCAINE 2% (20 MG/ML) 5 ML SYRINGE
INTRAMUSCULAR | Status: AC
  Filled 2020-07-28: qty 5

## 2020-07-28 MED ORDER — FENTANYL CITRATE (PF) 100 MCG/2ML IJ SOLN
INTRAMUSCULAR | Status: AC
  Administered 2020-07-28: 50 ug via INTRAVENOUS
  Filled 2020-07-28: qty 2

## 2020-07-28 MED ORDER — FENTANYL CITRATE (PF) 100 MCG/2ML IJ SOLN: 25.0000 ug | INTRAMUSCULAR | Status: DC | PRN

## 2020-07-28 MED ORDER — FENTANYL CITRATE (PF) 100 MCG/2ML IJ SOLN
INTRAMUSCULAR | Status: AC
  Filled 2020-07-28: qty 2

## 2020-07-28 MED ORDER — CHLORHEXIDINE GLUCONATE 0.12 % MT SOLN
15.0000 mL | Freq: Once | OROMUCOSAL | Status: AC
  Administered 2020-07-28: 15 mL via OROMUCOSAL

## 2020-07-28 MED ORDER — SODIUM CHLORIDE 0.9 % IV SOLN: INTRAVENOUS | Status: AC

## 2020-07-28 MED ORDER — VANCOMYCIN HCL 1000 MG IV SOLR
INTRAVENOUS | Status: AC
  Filled 2020-07-28: qty 1000

## 2020-07-28 MED ORDER — ROCURONIUM BROMIDE 10 MG/ML (PF) SYRINGE
PREFILLED_SYRINGE | INTRAVENOUS | Status: AC
  Filled 2020-07-28: qty 10

## 2020-07-28 MED ORDER — LIP MEDEX EX OINT
TOPICAL_OINTMENT | CUTANEOUS | Status: DC | PRN
  Filled 2020-07-28: qty 7

## 2020-07-28 SURGICAL SUPPLY — 68 items
BAG COUNTER SPONGE SURGICOUNT (BAG) IMPLANT
BAG ZIPLOCK 12X15 (MISCELLANEOUS) ×2 IMPLANT
BLADE SAW SGTL 83.5X18.5 (BLADE) ×2 IMPLANT
COOLER ICEMAN CLASSIC (MISCELLANEOUS) IMPLANT
COVER BACK TABLE 60X90IN (DRAPES) ×2 IMPLANT
COVER SURGICAL LIGHT HANDLE (MISCELLANEOUS) ×2 IMPLANT
CUP SUT UNIV REVERS 36 NEUTRAL (Cup) ×2 IMPLANT
DERMABOND ADVANCED (GAUZE/BANDAGES/DRESSINGS) ×1
DERMABOND ADVANCED .7 DNX12 (GAUZE/BANDAGES/DRESSINGS) ×1 IMPLANT
DRAPE INCISE IOBAN 66X45 STRL (DRAPES) IMPLANT
DRAPE ORTHO SPLIT 77X108 STRL (DRAPES) ×2
DRAPE SHEET LG 3/4 BI-LAMINATE (DRAPES) ×2 IMPLANT
DRAPE SURG 17X11 SM STRL (DRAPES) ×2 IMPLANT
DRAPE SURG ORHT 6 SPLT 77X108 (DRAPES) ×2 IMPLANT
DRAPE TOP 10253 STERILE (DRAPES) ×2 IMPLANT
DRAPE U-SHAPE 47X51 STRL (DRAPES) ×2 IMPLANT
DRESSING AQUACEL AG SP 3.5X6 (GAUZE/BANDAGES/DRESSINGS) ×1 IMPLANT
DRSG AQUACEL AG ADV 3.5X10 (GAUZE/BANDAGES/DRESSINGS) ×2 IMPLANT
DRSG AQUACEL AG SP 3.5X6 (GAUZE/BANDAGES/DRESSINGS) ×2
DURAPREP 26ML APPLICATOR (WOUND CARE) ×2 IMPLANT
ELECT BLADE TIP CTD 4 INCH (ELECTRODE) ×2 IMPLANT
ELECT REM PT RETURN 15FT ADLT (MISCELLANEOUS) ×2 IMPLANT
FACESHIELD WRAPAROUND (MASK) ×8 IMPLANT
GLENOID UNI REV MOD 24 +2 LAT (Joint) ×2 IMPLANT
GLENOSPHERE 36 +4 LAT/24 (Joint) ×2 IMPLANT
GLOVE SRG 8 PF TXTR STRL LF DI (GLOVE) ×1 IMPLANT
GLOVE SURG ENC MOIS LTX SZ7 (GLOVE) ×2 IMPLANT
GLOVE SURG ENC MOIS LTX SZ7.5 (GLOVE) ×2 IMPLANT
GLOVE SURG UNDER POLY LF SZ7 (GLOVE) ×2 IMPLANT
GLOVE SURG UNDER POLY LF SZ8 (GLOVE) ×1
GOWN STRL REUS W/TWL LRG LVL3 (GOWN DISPOSABLE) ×4 IMPLANT
INSERT HUMERAL 36 +6 (Shoulder) ×2 IMPLANT
KIT BASIN OR (CUSTOM PROCEDURE TRAY) ×2 IMPLANT
KIT TURNOVER KIT A (KITS) ×2 IMPLANT
MANIFOLD NEPTUNE II (INSTRUMENTS) ×2 IMPLANT
NEEDLE TAPERED W/ NITINOL LOOP (MISCELLANEOUS) ×2 IMPLANT
NS IRRIG 1000ML POUR BTL (IV SOLUTION) ×2 IMPLANT
PACK SHOULDER (CUSTOM PROCEDURE TRAY) ×2 IMPLANT
PAD ARMBOARD 7.5X6 YLW CONV (MISCELLANEOUS) ×2 IMPLANT
PAD COLD SHLDR WRAP-ON (PAD) IMPLANT
PIN NITINOL TARGETER 2.8 (PIN) IMPLANT
PIN SET MODULAR GLENOID SYSTEM (PIN) IMPLANT
RESTRAINT HEAD UNIVERSAL NS (MISCELLANEOUS) ×2 IMPLANT
SCREW CENTRAL MODULAR 25 (Screw) ×2 IMPLANT
SCREW PERI LOCK 5.5X16 (Screw) ×4 IMPLANT
SCREW PERIPHERAL 5.5X20 LOCK (Screw) ×2 IMPLANT
SCREW PERIPHERAL 5.5X28 LOCK (Screw) ×2 IMPLANT
SLING ARM FOAM STRAP LRG (SOFTGOODS) IMPLANT
SLING ARM FOAM STRAP MED (SOFTGOODS) IMPLANT
SPONGE T-LAP 18X18 ~~LOC~~+RFID (SPONGE) IMPLANT
STEM HUMERAL UNI REVERS SZ6 (Stem) ×2 IMPLANT
STRIP CLOSURE SKIN 1/2X4 (GAUZE/BANDAGES/DRESSINGS) ×2 IMPLANT
SUCTION FRAZIER HANDLE 12FR (TUBING) ×1
SUCTION TUBE FRAZIER 12FR DISP (TUBING) ×1 IMPLANT
SUT FIBERTAPE CERCLAGE 2 48 (SUTURE) ×2 IMPLANT
SUT FIBERWIRE #2 38 T-5 BLUE (SUTURE)
SUT MNCRL AB 3-0 PS2 18 (SUTURE) ×2 IMPLANT
SUT MON AB 2-0 CT1 36 (SUTURE) ×2 IMPLANT
SUT VIC AB 1 CT1 36 (SUTURE) ×2 IMPLANT
SUTURE FIBERWR #2 38 T-5 BLUE (SUTURE) IMPLANT
SUTURE TAPE 1.3 40 TPR END (SUTURE) ×2 IMPLANT
SUTURE TAPE TIGERLINK 1.3MM BL (SUTURE) ×1 IMPLANT
SUTURETAPE 1.3 40 TPR END (SUTURE) ×4
SUTURETAPE TIGERLINK 1.3MM BL (SUTURE) ×2
TOWEL OR 17X26 10 PK STRL BLUE (TOWEL DISPOSABLE) ×2 IMPLANT
TOWEL OR NON WOVEN STRL DISP B (DISPOSABLE) ×2 IMPLANT
WATER STERILE IRR 1000ML POUR (IV SOLUTION) ×4 IMPLANT
YANKAUER SUCT BULB TIP 10FT TU (MISCELLANEOUS) ×2 IMPLANT

## 2020-07-28 NOTE — Anesthesia Preprocedure Evaluation (Addendum)
Anesthesia Evaluation  Patient identified by MRN, date of birth, ID band Patient awake    Reviewed: Allergy & Precautions, NPO status , Patient's Chart, lab work & pertinent test results  Airway Mallampati: II  TM Distance: >3 FB Neck ROM: Full    Dental no notable dental hx.    Pulmonary neg pulmonary ROS,    Pulmonary exam normal breath sounds clear to auscultation       Cardiovascular Normal cardiovascular exam+ pacemaker  Rhythm:Regular Rate:Normal     Neuro/Psych negative neurological ROS  negative psych ROS   GI/Hepatic negative GI ROS, Neg liver ROS,   Endo/Other  diabetesHypothyroidism Morbid obesity  Renal/GU Renal InsufficiencyRenal disease  negative genitourinary   Musculoskeletal negative musculoskeletal ROS (+)   Abdominal   Peds negative pediatric ROS (+)  Hematology  (+) anemia ,   Anesthesia Other Findings   Reproductive/Obstetrics negative OB ROS                            Anesthesia Physical Anesthesia Plan  ASA: 3  Anesthesia Plan: General   Post-op Pain Management:    Induction: Intravenous  PONV Risk Score and Plan: 3 and Ondansetron, Dexamethasone and Treatment may vary due to age or medical condition  Airway Management Planned: Oral ETT  Additional Equipment:   Intra-op Plan:   Post-operative Plan: Extubation in OR  Informed Consent: I have reviewed the patients History and Physical, chart, labs and discussed the procedure including the risks, benefits and alternatives for the proposed anesthesia with the patient or authorized representative who has indicated his/her understanding and acceptance.     Dental advisory given  Plan Discussed with: CRNA and Surgeon  Anesthesia Plan Comments:         Anesthesia Quick Evaluation

## 2020-07-28 NOTE — Op Note (Addendum)
07/25/2020 - 07/28/2020  5:56 PM  PATIENT:   Jillian Leblanc  75 y.o. female  PRE-OPERATIVE DIAGNOSIS:  CHRONIC RIGHT SHOULDER DISLOCATION  POST-OPERATIVE DIAGNOSIS: Same with massive rotator cuff tear  PROCEDURE: Right shoulder reverse arthroplasty utilizing a press-fit size 6 Arthrex stem with a +6 polyethylene insert on a neutral metaphysis, 36/+4 glenosphere on a small/+2 baseplate  SURGEON:  Yolanda Huffstetler, Vania Rea M.D.  ASSISTANTS: Ralene Bathe, PA-C  ANESTHESIA:   General endotracheal and interscalene block with Exparel  EBL: 400 cc  SPECIMEN: None  Drains: None   PATIENT DISPOSITION:  PACU - hemodynamically stable.    PLAN OF CARE: Admit for overnight observation  Brief history:  [name] a 75 year old female who reports having sustained several falls over the last number of months with chronic right shoulder pain after fall just over a month ago and ultimately presented to an outside urgent care with complaints of ongoing shoulder pain.  X-ray showed a anterior inferior dislocation.  An additional attempt to close reduction was unsuccessful.  She was subsequently transferred to the [facility] long hospital whereupon work-up in addition to her chronic dislocation she is found to have significant anemia.  She is subsequent been admitted to the medicine service where she has been medically optimized as well as transfused and is now brought to the operating room for planned right shoulder reverse arthroplasty for the treatment of her chronic dislocation with likely associated massive rotator cuff tear.  Preoperatively, I counseled the patient regarding treatment options and risks versus benefits thereof.  Possible surgical complications were all reviewed including potential for bleeding, infection, neurovascular injury, persistent pain, loss of motion, anesthetic complication, failure of the implant, and possible need for additional surgery. They understand and accept and agrees with our  planned procedure.   Procedure in detail:  After undergoing routine preop evaluation the patient received prophylactic antibiotics and interscalene block with Exparel was established in the holding area by the anesthesia department.  The patient was subsequently placed supine on the operating table and underwent the smooth induction of a general endotracheal anesthesia.  Placed into the beachchair position and appropriately padded and protected.  The right shoulder girdle region was sterilely prepped and draped in standard fashion.  Timeout was called.  An anterior approach the right shoulder is made through an approximate 10 cm incision.  Dissection carried deeply skin flaps were elevated and the deltopectoral interval was ultimately identified.  The overall contour of the shoulder was significantly abnormal due to the fixed anterior dislocation but ultimately did identify the deltopectoral oropharynx was then opened which revealed that the humeral head was completely denuded of soft tissue attachments with complete avulsion of the rotator cuff circumferentially.  This had caused the humeral head to buttonhole making it irreducible.  The tissues had significantly retracted medially as well.  Once the humeral head was exposed we used an oscillating saw to perform the humeral head osteotomy at approximate 20 degrees retroversion.  A metal cap was then placed over the cut proximal humeral surface and we then exposed the glenoid with appropriate retractors.  Some remnants of rotator cuff as well as labrum were completely excised to gain complete visualization of the glenoid.  A guidepin was then directed into the center of the glenoid with an approximate 10 degree inferior tilt and the glenoid was then prepared with the central followed by peripheral reamers and the central hole was drilled and tapped.  A 25 mm lag screw was then selected.  Our  baseplate was assembled and inserted with vancomycin powder applied to  the threads of the lag screw and the baseplate achieved excellent fixation.  The peripheral locking screws were all then placed.  A 36/+4 glenosphere was then impacted over the baseplate and the central locking screw was placed.  We then returned our attention back to the proximal humerus where the canal was opened and we broached up to a size 6.  Upon broaching we did identify a unicortical defect in the humeral metaphysis and so we did place a fiber cerclage to support the metaphysis and ultimately we selected the size 6 stem with trial reduction showed good motion good stability good soft tissue balance.  We did use a neutral metaphysis reaming guide.  At this point our final implant was then assembled.  The cerclage tape was then tightened and our size 6 stem was then impacted achieving excellent fit and fixation.  We then performed another trial reduction and ultimately selected the +6 poly which gave [place] the best motion good stability good soft tissue balance.  Trial polyp was then removed the final polyp was impacted the fiber cerclage was then terminally tightened and then additional overhand throws were placed to lock the fiber cerclage.  Final reduction was then performed.  This showed good motion good stability good soft tissue balance.  The wound was then copiously irrigated.  Final hemostasis was obtained although I should note that she did have moderate oozing from all of the surfaces and upon entry of the joint at the beginning the case she did have a very large hemarthrosis/seroma that we evacuated.  Ultimately deltopectoral interval was reapproximated with a series of figure-of-eight and 1 Vicryl sutures.  2-0 Monocryl used to close the subcu layer and intracuticular 3-0 Monocryl for the skin followed by Dermabond and Aquacel dressing.  The right arm was placed into a sling.  Patient was then awakened, extubated, and taken to the recovery room in stable condition.  Ralene Bathe, PA-C was utilized  as an Geophysicist/field seismologist throughout this case, essential for help with positioning the patient, positioning extremity, tissue manipulation, implantation of the prosthesis, suture management, wound closure, and intraoperative decision-making.  Postop plan will be for sling immobilization for the next 2 to 3 weeks.  She may remove the sling to perform gentle pendulum exercises as well as gentle range of motion of the elbow, wrist, and hand.  No weightbearing.  She may use the right arm at waist level for light activities of daily living as her pain allows.  She may shower immediately, leave the Aquacel dressing in place and we will remove it at her first postop visit  Follow-up in my office approximately 2 weeks postop.  Senaida Lange MD   Contact # [phone]

## 2020-07-28 NOTE — Transfer of Care (Signed)
Immediate Anesthesia Transfer of Care Note  Patient: Jillian Leblanc  Procedure(s) Performed: REVERSE SHOULDER ARTHROPLASTY (Right: Shoulder)  Patient Location: PACU  Anesthesia Type:GA combined with regional for post-op pain  Level of Consciousness: awake and patient cooperative  Airway & Oxygen Therapy: Patient Spontanous Breathing and Patient connected to face mask oxygen  Post-op Assessment: Report given to RN and Post -op Vital signs reviewed and stable  Post vital signs: Reviewed and stable  Last Vitals:  Vitals Value Taken Time  BP 123/61 07/28/20 1826  Temp    Pulse 81 07/28/20 1830  Resp 19 07/28/20 1830  SpO2 100 % 07/28/20 1830  Vitals shown include unvalidated device data.  Last Pain:  Vitals:   07/28/20 1515  TempSrc: Oral  PainSc:       Patients Stated Pain Goal: 2 (07/28/20 1315)  Complications: No notable events documented.

## 2020-07-28 NOTE — Anesthesia Procedure Notes (Signed)
Procedure Name: Intubation Date/Time: 07/28/2020 4:19 PM Performed by: Ezekiel Ina, CRNA Pre-anesthesia Checklist: Patient identified, Emergency Drugs available, Suction available and Patient being monitored Patient Re-evaluated:Patient Re-evaluated prior to induction Oxygen Delivery Method: Circle System Utilized Preoxygenation: Pre-oxygenation with 100% oxygen Induction Type: IV induction Ventilation: Mask ventilation without difficulty Laryngoscope Size: Miller and 2 Grade View: Grade I Tube type: Oral Tube size: 7.0 mm Number of attempts: 1 Airway Equipment and Method: Stylet Placement Confirmation: ETT inserted through vocal cords under direct vision, positive ETCO2 and breath sounds checked- equal and bilateral Secured at: 23 cm Tube secured with: Tape Dental Injury: Teeth and Oropharynx as per pre-operative assessment

## 2020-07-28 NOTE — Anesthesia Procedure Notes (Signed)
Anesthesia Procedure Image    

## 2020-07-28 NOTE — Progress Notes (Signed)
Jillian Leblanc  MRN: [id number] DOB/Age: 73-May-1947 73 y.o. Physician: Lynnea Maizes, M.D. Day of Surgery Procedure(s) (LRB): REVERSE SHOULDER ARTHROPLASTY (Right)  Subjective: Patient feeling better status posttransfusion.  Continues to describe significant right shoulder pain and restricted mobility. Vital Signs Temp:  [98.4 F (36.9 C)-99.1 F (37.3 C)] 98.6 F (37 C) (07/05 1322) Pulse Rate:  [69-81] 81 (07/05 1515) Resp:  [18-20] 18 (07/05 1515) BP: (124-150)/(54-71) 150/66 (07/05 1515) SpO2:  [90 %-98 %] 98 % (07/05 1515)  Lab Results Recent Labs    07/27/20 0547 07/28/20 0438  WBC 4.8 5.9  HGB 8.8* 9.4*  HCT 29.2* 31.6*  PLT 287 302   BMET Recent Labs    07/27/20 0547 07/28/20 0438  NA 132* 130*  K 3.9 4.1  CL 96* 96*  CO2 27 25  GLUCOSE 138* 118*  BUN 14 13  CREATININE 1.23* 1.32*  CALCIUM 7.4* 7.8*   INR  Date Value Ref Range Status  06/22/2019 0.9 0.8 - 1.2 Final    Comment:    (NOTE) INR goal varies based on device and disease states. Performed at [facility], [address] [place], [place] [address]      Exam  Right shoulder once again demonstrates diffuse swelling with anterior prominence related to her chronic anterior inferior dislocation.  She remains grossly neurovascular intact in the right upper extremity.  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  Plan:  I have once again counseled the patient regarding treatment options as well as the potential risks versus benefits thereof.  She understands, and accepts, and agrees with plan for reverse shoulder arthroplasty.   Jillian Leblanc M Hertha Gergen 07/28/2020, 3:24 PM   Contact # [phone]

## 2020-07-28 NOTE — Plan of Care (Signed)

## 2020-07-28 NOTE — Anesthesia Procedure Notes (Signed)
Anesthesia Regional Block: Interscalene brachial plexus block   Pre-Anesthetic Checklist: , timeout performed,  Correct Patient, Correct Site, Correct Laterality,  Correct Procedure, Correct Position, site marked,  Risks and benefits discussed,  Surgical consent,  Pre-op evaluation,  At surgeon's request and post-op pain management  Laterality: Right  Prep: chloraprep       Needles:  Injection technique: Single-shot  Needle Type: Echogenic Stimulator Needle     Needle Length: 9cm      Additional Needles:   Procedures:, nerve stimulator,,, ultrasound used (permanent image in chart),,     Nerve Stimulator or Paresthesia:  Response: 0.44 mA  Additional Responses:   Narrative:  Start time: 07/28/2020 3:38 PM End time: 07/28/2020 3:46 PM Injection made incrementally with aspirations every 5 mL.  Performed by: Personally  Anesthesiologist: Eilene Ghazi, MD  Additional Notes: Patient tolerated the procedure well without complications

## 2020-07-28 NOTE — Progress Notes (Signed)
PROGRESS NOTE    Jillian Leblanc  URK:[id number] DOB: July 30, 1945 DOA: 07/25/2020 PCP: Karle Plumber, MD   Brief Narrative:  75 year old with history of DM2, anemia, CKD stage IIIa, hypothyroidism admitted to the hospital for recurrent falls.  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  In the ER she was found to be anemic with hemoglobin of 6.9 received 1 unit PRBC transfusion.  Signs of dehydration and right shoulder dislocation.  Attempts for right shoulder dislocation were unsuccessful in the ED therefore orthopedic was consulted.  Found to be in iron deficiency anemia therefore received IV iron with p.o. supplements.  GI recommends conservative management for now.   Assessment & Plan:   Principal Problem:   Symptomatic anemia Active Problems:   Recurrent falls   Acute kidney injury superimposed on chronic kidney disease (HCC)   Hyponatremia   Hypokalemia   Dislocated shoulder, right, initial encounter   Closed displaced fracture of coracoid process of right shoulder   Diabetes mellitus type 2 in obese (HCC)   Elevated troponin  Symptomatic anemia, iron deficiency - Admission hemoglobin 6.9.  Baseline hemoglobin 8.0.  Status post 2 units PRBC transfusion.  H hemoglobin 9.4 - Seen by GI-conservative management.  PPI twice daily - IV iron 7/3.  Continue p.o. with bowel regimen  Hyponatremia/hypokalemia Mild dehydration -Improved  Mild rhabdomyolysis secondary to falls -Improved  Right shoulder dislocation -OR today  Recurrent falls - Echocardiogram 2021-EF 55 to 60%, grade 1 DD. - PT/OT-SNF - Suspect from generalized weakness from dehydration and symptomatic anemia  UTI, Enterobacter -On Cipro, total 5 days  Elevated troponin - Chronic elevation.  No signs of acute ischemia on  Hypothyroidism with elevated TSH - TSH 25, T4 normal.  Continue Synthroid.  Repeat in 3-4 weeks  Diabetes mellitus type 2 Peripheral  neuropathy - Hold off on metformin.  Lantus 10 units daily, Accu-Cheks and sliding scale - Gabapentin  DVT prophylaxis: SCDs Start: 07/26/20 0451 Code Status: Full Family Communication: Daughter updated periodically   Dispo: The patient is from: Home              Anticipated d/c is to: SNF              Patient currently is not medically stable to d/c.  Plans for OR for shoulder repair today thereafter will require SNF.   Difficult to place patient No   Subjective: Feels okay no complaints    Examination: Constitutional: Not in acute distress Respiratory: Clear to auscultation bilaterally Cardiovascular: Normal sinus rhythm, no rubs Abdomen: Nontender nondistended good bowel sounds Musculoskeletal: Limited range of motion of her right upper extremity Skin: No rashes seen Neurologic: CN 2-12 grossly intact.  And nonfocal Psychiatric: Normal judgment and insight. Alert and oriented x 3. Normal mood.   Objective: Vitals:   07/27/20 0441 07/27/20 1345 07/27/20 1929 07/28/20 0525  BP: 140/65 123/62 131/61 (!) 124/54  Pulse: 79 60 69 78  Resp: 20 15 20 20   Temp: 98.6 F (37 C) (!) 97.5 F (36.4 C) 98.4 F (36.9 C) 99.1 F (37.3 C)  TempSrc: Oral Oral Oral Oral  SpO2: 94% 99% 95% 92%  Weight:      Height:        Intake/Output Summary (Last 24 hours) at 07/28/2020 1143 Last data filed at 07/28/2020 0459 Gross per 24 hour  Intake 776.06 ml  Output 1800 ml  Net -1023.94 ml   Filed Weights   07/25/20 2209  Weight: 90.7 kg  Data Reviewed:   CBC: Recent Labs  Lab 07/25/20 2242 07/26/20 0528 07/27/20 0547 07/28/20 0438  WBC 6.1 4.6 4.8 5.9  NEUTROABS 4.7  --   --   --   HGB 6.9* 7.2* 8.8* 9.4*  HCT 22.3* 24.4* 29.2* 31.6*  MCV 66.4* 68.7* 74.5* 75.1*  PLT 347 328 287 302   Basic Metabolic Panel: Recent Labs  Lab 07/25/20 2242 07/26/20 0528 07/27/20 0547 07/28/20 0438  NA 125* 129* 132* 130*  K 3.0* 3.1* 3.9 4.1  CL 85* 90* 96* 96*  CO2 29 29 27  25   GLUCOSE 200* 123* 138* 118*  BUN 20 18 14 13   CREATININE 1.48* 1.36* 1.23* 1.32*  CALCIUM 7.0* 7.4* 7.4* 7.8*  MG  --  2.2 2.2 2.1   GFR: Estimated Creatinine Clearance: 38.1 mL/min (A) (by C-G formula based on SCr of 1.32 mg/dL (H)). Liver Function Tests: Recent Labs  Lab 07/25/20 2242  AST 26  ALT 22  ALKPHOS 65  BILITOT 0.7  PROT 7.1  ALBUMIN 3.2*   No results for input(s): LIPASE, AMYLASE in the last 168 hours. No results for input(s): AMMONIA in the last 168 hours. Coagulation Profile: No results for input(s): INR, PROTIME in the last 168 hours. Cardiac Enzymes: Recent Labs  Lab 07/26/20 0650  CKTOTAL 500*   BNP (last 3 results) No results for input(s): PROBNP in the last 8760 hours. HbA1C: Recent Labs    07/26/20 0528  HGBA1C 7.3*   CBG: Recent Labs  Lab 07/27/20 1121 07/27/20 1637 07/27/20 2126 07/28/20 0743 07/28/20 1122  GLUCAP 108* 76 142* 121* 103*   Lipid Profile: No results for input(s): CHOL, HDL, LDLCALC, TRIG, CHOLHDL, LDLDIRECT in the last 72 hours. Thyroid Function Tests: Recent Labs    07/26/20 0534  TSH 25.065*  FREET4 0.76   Anemia Panel: Recent Labs    07/26/20 0528  FERRITIN 26  TIBC 470*  IRON 34   Sepsis Labs: No results for input(s): PROCALCITON, LATICACIDVEN in the last 168 hours.  Recent Results (from the past 240 hour(s))  Resp Panel by RT-PCR (Flu A&B, Covid) Nasopharyngeal Swab     Status: None   Collection Time: 07/25/20 10:42 PM   Specimen: Nasopharyngeal Swab; Nasopharyngeal(NP) swabs in vial transport medium  Result Value Ref Range Status   SARS Coronavirus 2 by RT PCR NEGATIVE NEGATIVE Final    Comment: (NOTE) SARS-CoV-2 target nucleic acids are NOT DETECTED.  The SARS-CoV-2 RNA is generally detectable in upper respiratory specimens during the acute phase of infection. The lowest concentration of SARS-CoV-2 viral copies this assay can detect is 138 copies/mL. A negative result does not preclude  SARS-Cov-2 infection and should not be used as the sole basis for treatment or other patient management decisions. A negative result may occur with  improper specimen collection/handling, submission of specimen other than nasopharyngeal swab, presence of viral mutation(s) within the areas targeted by this assay, and inadequate number of viral copies(<138 copies/mL). A negative result must be combined with clinical observations, patient history, and epidemiological information. The expected result is Negative.  Fact Sheet for Patients:  09/26/20  Fact Sheet for Healthcare Providers:  09/25/20  This test is no t yet approved or cleared by the [employer] FDA and  has been authorized for detection and/or diagnosis of SARS-CoV-2 by FDA under an Emergency Use Authorization (EUA). This EUA will remain  in effect (meaning this test can be used) for the duration of the COVID-19 declaration under Section 564(b)(1)  of the Act, 21 U.S.C.section 360bbb-3(b)(1), unless the authorization is terminated  or revoked sooner.       Influenza A by PCR NEGATIVE NEGATIVE Final   Influenza B by PCR NEGATIVE NEGATIVE Final    Comment: (NOTE) The Xpert Xpress SARS-CoV-2/FLU/RSV plus assay is intended as an aid in the diagnosis of influenza from Nasopharyngeal swab specimens and should not be used as a sole basis for treatment. Nasal washings and aspirates are unacceptable for Xpert Xpress SARS-CoV-2/FLU/RSV testing.  Fact Sheet for Patients: [employer]  Fact Sheet for Healthcare Providers: [employer]  This test is not yet approved or cleared by the [place] FDA and has been authorized for detection and/or diagnosis of SARS-CoV-2 by FDA under an Emergency Use Authorization (EUA). This EUA will remain in effect (meaning this test can be used) for the duration of  the COVID-19 declaration under Section 564(b)(1) of the Act, 21 U.S.C. section 360bbb-3(b)(1), unless the authorization is terminated or revoked.  Performed at [facility], [address] [place], [place] [address]   Urine culture     Status: Abnormal (Preliminary result)   Collection Time: 07/26/20 12:14 AM   Specimen: Urine, Clean Catch  Result Value Ref Range Status   Specimen Description   Final    URINE, CLEAN CATCH Performed at [facility], [address] [place], [place] [address]    Special Requests   Final    NONE Performed at [facility], [address] [place], [place] [address]    Culture (A)  Final    >=100,000 COLONIES/mL ENTEROBACTER CLOACAE SUSCEPTIBILITIES TO FOLLOW Performed at [facility], [address] [place], [place] [address]    Report Status PENDING  Incomplete         Radiology Studies: No results found.      Scheduled Meds:  ciprofloxacin  500 mg Oral BID   ferrous sulfate  325 mg Oral BID WC   insulin aspart  0-15 Units Subcutaneous TID WC   insulin aspart  0-5 Units Subcutaneous QHS   insulin glargine  10 Units Subcutaneous Daily   levothyroxine  125 mcg Oral Q0600   oxybutynin  10 mg Oral Daily   pantoprazole  40 mg Oral BID AC   senna-docusate  2 tablet Oral QHS   Continuous Infusions:  sodium chloride      ceFAZolin (ANCEF) IV     promethazine (PHENERGAN) injection (IM or IVPB)     tranexamic acid       LOS: 2 days   Time spent= 35 mins    Royann Wildasin Joline Maxcy, MD [facility]  If 7PM-7AM, please contact night-coverage  07/28/2020, 11:43 AM

## 2020-07-29 LAB — CBC
HCT: 30.7 % — ABNORMAL LOW (ref 36.0–46.0)
Hemoglobin: 8.7 g/dL — ABNORMAL LOW (ref 12.0–15.0)
MCH: 22.6 pg — ABNORMAL LOW (ref 26.0–34.0)
MCHC: 28.3 g/dL — ABNORMAL LOW (ref 30.0–36.0)
MCV: 79.7 fL — ABNORMAL LOW (ref 80.0–100.0)
Platelets: 267 10*3/uL (ref 150–400)
RBC: 3.85 MIL/uL — ABNORMAL LOW (ref 3.87–5.11)
RDW: 23.5 % — ABNORMAL HIGH (ref 11.5–15.5)
WBC: 6.7 10*3/uL (ref 4.0–10.5)
nRBC: 0 % (ref 0.0–0.2)

## 2020-07-29 LAB — URINE CULTURE: Culture: >=100000 — AB

## 2020-07-29 LAB — BASIC METABOLIC PANEL
Anion gap: 9 (ref 5–15)
BUN: 10 mg/dL (ref 8–23)
CO2: 23 mmol/L (ref 22–32)
Calcium: 7.3 mg/dL — ABNORMAL LOW (ref 8.9–10.3)
Chloride: 98 mmol/L (ref 98–111)
Creatinine, Ser: 1.22 mg/dL — ABNORMAL HIGH (ref 0.44–1.00)
GFR, Estimated: 47 mL/min — ABNORMAL LOW (ref >60)
Glucose, Bld: 174 mg/dL — ABNORMAL HIGH (ref 70–99)
Potassium: 4.4 mmol/L (ref 3.5–5.1)
Sodium: 130 mmol/L — ABNORMAL LOW (ref 135–145)

## 2020-07-29 LAB — GLUCOSE, CAPILLARY
Glucose-Capillary: 111 mg/dL — ABNORMAL HIGH (ref 70–99)
Glucose-Capillary: 117 mg/dL — ABNORMAL HIGH (ref 70–99)
Glucose-Capillary: 144 mg/dL — ABNORMAL HIGH (ref 70–99)

## 2020-07-29 LAB — MAGNESIUM: Magnesium: 1.8 mg/dL (ref 1.7–2.4)

## 2020-07-29 MED ORDER — FERROUS SULFATE 325 (65 FE) MG PO TABS: 325.0000 mg | ORAL_TABLET | Freq: Every day | ORAL | 3 refills | Status: DC

## 2020-07-29 MED ORDER — SENNOSIDES-DOCUSATE SODIUM 8.6-50 MG PO TABS: 2.0000 | ORAL_TABLET | Freq: Every evening | ORAL | 0 refills | Status: DC | PRN

## 2020-07-29 MED ORDER — MELATONIN 3 MG PO TABS
3.0000 mg | ORAL_TABLET | Freq: Every day | ORAL | Status: DC
  Administered 2020-07-29: 3 mg via ORAL
  Filled 2020-07-29: qty 1

## 2020-07-29 MED ORDER — PANTOPRAZOLE SODIUM 40 MG PO TBEC: 40.0000 mg | DELAYED_RELEASE_TABLET | Freq: Two times a day (BID) | ORAL | 0 refills | Status: DC

## 2020-07-29 MED ORDER — HYDROMORPHONE HCL 2 MG PO TABS: 2.0000 mg | ORAL_TABLET | ORAL | 0 refills | Status: DC | PRN

## 2020-07-29 MED ORDER — CIPROFLOXACIN HCL 500 MG PO TABS: 500.0000 mg | ORAL_TABLET | Freq: Two times a day (BID) | ORAL | 0 refills | Status: AC

## 2020-07-29 NOTE — Discharge Instructions (Signed)
 Jillian Leblanc, M.D., F.A.A.O.S. Orthopaedic Surgery Specializing in Arthroscopic and Reconstructive Surgery of the Shoulder [phone] [address] - [place], [place] [address] - Fax [phone]   POST-OP TOTAL SHOULDER REPLACEMENT INSTRUCTIONS  1. Follow up in the office for your first post-op appointment 10-14 days from the date of your surgery. If you do not already have a scheduled appointment, our office will contact you to schedule.  2. The bandage over your incision is waterproof. You may begin showering with this dressing on. You may leave this dressing on until first follow up appointment within 2 weeks. We prefer you leave this dressing in place until follow up however after 5-7 days if you are having itching or skin irritation and would like to remove it you may do so. Go slow and tug at the borders gently to break the bond the dressing has with the skin. At this point if there is no drainage it is okay to go without a bandage or you may cover it with a light guaze and tape. You can also expect significant bruising around your shoulder that will drift down your arm and into your chest wall. This is very normal and should resolve over several days.   3. Wear your sling/immobilizer at all times except to perform the exercises below or to occasionally let your arm dangle by your side to stretch your elbow. You also need to sleep in your sling immobilizer until instructed otherwise. It is ok to remove your sling if you are sitting in a controlled environment and allow your arm to rest in a position of comfort by your side or on your lap with pillows to give your neck and skin a break from the sling. You may remove it to allow arm to dangle by side to shower. If you are up walking around and when you go to sleep at night you need to wear it.  4. Range of motion to your elbow, wrist, and hand are encouraged 3-5 times daily. Exercise to your hand and fingers helps to reduce  swelling you may experience.   5. Prescriptions for a pain medication and a muscle relaxant are provided for you. It is recommended that if you are experiencing pain that you pain medication alone is not controlling, add the muscle relaxant along with the pain medication which can give additional pain relief. The first 1-2 days is generally the most severe of your pain and then should gradually decrease. As your pain lessens it is recommended that you decrease your use of the pain medications to an "as needed basis'" only and to always comply with the recommended dosages of the pain medications.  6. Pain medications can produce constipation along with their use. If you experience this, the use of an over the counter stool softener or laxative daily is recommended.   7. For additional questions or concerns, please do not hesitate to call the office. If after hours there is an answering service to forward your concerns to the physician on call.  8.Pain control following an exparel block  To help control your post-operative pain you received a nerve block  performed with Exparel which is a long acting anesthetic (numbing agent) which can provide pain relief and sensations of numbness (and relief of pain) in the operative shoulder and arm for up to 3 days. Sometimes it provides mixed relief, meaning you may still have numbness in certain areas of the arm but can still be able to   move  parts of that arm, hand, and fingers. We recommend that your prescribed pain medications  be used as needed. We do not feel it is necessary to "pre medicate" and "stay ahead" of pain.  Taking narcotic pain medications when you are not having any pain can lead to unnecessary and potentially dangerous side effects.    9. Use the ice machine as much as possible in the first 5-7 days from surgery, then you can wean its use to as needed. The ice typically needs to be replaced every 6 hours, instead of ice you can actually freeze  water bottles to put in the cooler and then fill water around them to avoid having to purchase ice. You can have spare water bottles freezing to allow you to rotate them once they have melted. Try to have a thin shirt or light cloth or towel under the ice wrap to protect your skin.   FOR ADDITIONAL INFO ON ICE MACHINE AND INSTRUCTIONS GO TO THE WEBSITE AT  https://[email or web address]/donjoy/[email or web address]-iceman-[email or web address]  10.  We recommend that you avoid any dental work or cleaning in the first 3 months following your joint replacement. This is to help minimize the possibility of infection from the bacteria in your mouth that enters your bloodstream during dental work. We also recommend that you take an antibiotic prior to your dental work for the first year after your shoulder replacement to further help reduce that risk. Please simply contact our office for antibiotics to be sent to your pharmacy prior to dental work.  11. Dental Antibiotics:  In most cases prophylactic antibiotics for Dental procdeures after total joint surgery are not necessary.  Exceptions are as follows:  1. History of prior total joint infection  2. Severely immunocompromised (Organ Transplant, cancer chemotherapy, Rheumatoid biologic meds such as Humera)  3. Poorly controlled diabetes (A1C &gt; 8.0, blood glucose over 200)  If you have one of these conditions, contact your surgeon for an antibiotic prescription, prior to your dental procedure.   POST-OP EXERCISES  Pendulum Exercises  Perform pendulum exercises while standing and bending at the waist. Support your uninvolved arm on a table or chair and allow your operated arm to hang freely. Make sure to do these exercises passively - not using you shoulder muscles. These exercises can be performed once your nerve block effects have worn off.  Repeat 20 times. Do 3 sessions per day.     

## 2020-07-29 NOTE — Evaluation (Signed)
Occupational Therapy Evaluation Patient Details Name: Jillian Leblanc MRN: [id number] DOB: 01/30/1945 Today's Date: 07/29/2020    History of Present Illness [name] 75 y.o. female present to ER for anemia, dehydration, recurrent falls and Rt shoulder dislocation and fracture of the coracoid tip. PMH significant for DM2, anemia, CKD stage IIIa, hypothyroidism, symptomatic bradycardia with implanted Medtronic pacemaker.   Clinical Impression   [name] a 75 year old female that fell resulting in shoulder dislocation. Now s/p shoulder replacement without functional use of right dominant upper extremity secondary to effects of surgery and interscalene block and shoulder precautions. Therapist provided education and instruction to patient in regards to exercises, precautions, positioning, donning upper extremity clothing and bathing while maintaining shoulder precautions, ice and edema management and donning/doffing sling. Patient verbalized understanding and demonstrated as needed. Patient needed assistance to donn shirt, underwear, socks and provided with instruction on compensatory strategies to perform ADLs. Patient declining SNF and going home with family support + HH, acute OT to follow.      Follow Up Recommendations  Home health OT;Supervision/Assistance - 24 hour;Other (comment) (pt declines SNF)    Equipment Recommendations  None recommended by OT       Precautions / Restrictions Precautions Precautions: Shoulder Type of Shoulder Precautions: If sitting in controlled environment, ok to come out of sling to give neck a break. Please sleep in it to protect until follow up in office.     OK to use operative arm for feeding, hygiene and ADLs. Ok to instruct Pendulums and lap slides as exercises. Ok to use operative arm within the following parameters for ADL purposes New ROM Ok for PROM, AAROM, AROM within pain tolerance and within the following ROM ER 20 ABD 45 FE 60 Shoulder  Interventions: Shoulder sling/immobilizer;Off for dressing/bathing/exercises Precaution Booklet Issued: Yes (comment) Required Braces or Orthoses: Sling Restrictions Weight Bearing Restrictions: Yes RUE Weight Bearing: Non weight bearing      Mobility Bed Mobility Overal bed mobility: Modified Independent Bed Mobility: Supine to Sit     Supine to sit: HOB elevated     General bed mobility comments: able to sit up with heavy use of bed rail with L UE    Transfers Overall transfer level: Needs assistance Equipment used: Rolling walker (2 wheeled) Transfers: Sit to/from Stand Sit to Stand: Min assist         General transfer comment: cue to use L UE only, min A to power up to standing    Balance Overall balance assessment: Needs assistance;History of Falls Sitting-balance support: Feet supported Sitting balance-Leahy Scale: Fair     Standing balance support: Single extremity supported Standing balance-Leahy Scale: Poor Standing balance comment: reliant on UE support                           ADL either performed or assessed with clinical judgement   ADL Overall ADL's : Needs assistance/impaired     Grooming: Brushing hair;Set up;Sitting Grooming Details (indicate cue type and reason): with L UE Upper Body Bathing: Moderate assistance;Sitting   Lower Body Bathing: Maximal assistance;Sitting/lateral leans;Sit to/from stand   Upper Body Dressing : Moderate assistance;Sitting;Cueing for sequencing;Cueing for UE precautions Upper Body Dressing Details (indicate cue type and reason): to don night gown Lower Body Dressing: Sitting/lateral leans;Sit to/from stand;Total assistance Lower Body Dressing Details (indicate cue type and reason): unable to don socks and needing assist to thread underwear and pull up over buttock Toilet Transfer: Minimal  assistance Toilet Transfer Details (indicate cue type and reason): allow patient to hold onto rolling walker with L  UE, min A to power up to standing and take few side steps to head of bed Toileting- Clothing Manipulation and Hygiene: Sit to/from stand;Sitting/lateral lean;Maximal assistance         General ADL Comments: patient educated in compensatory strategies for self care tasks in order to maintain shoulder precautions      Pertinent Vitals/Pain Pain Assessment: 0-10 Pain Score: 6  Pain Location: Rt shoulder Pain Descriptors / Indicators: Discomfort;Grimacing;Guarding Pain Intervention(s): Monitored during session     Hand Dominance Right   Extremity/Trunk Assessment Upper Extremity Assessment Upper Extremity Assessment: RUE deficits/detail RUE Deficits / Details: + nerve block, able to feel hand only RUE: Unable to fully assess due to immobilization   Lower Extremity Assessment Lower Extremity Assessment: Defer to PT evaluation       Communication Communication Communication: No difficulties   Cognition Arousal/Alertness: Awake/alert Behavior During Therapy: WFL for tasks assessed/performed Overall Cognitive Status: Within Functional Limits for tasks assessed                                        Exercises Exercises: Shoulder   Shoulder Instructions Shoulder Instructions Donning/doffing shirt without moving shoulder: Moderate assistance;Patient able to independently direct caregiver Method for sponge bathing under operated UE: Moderate assistance;Patient able to independently direct caregiver Donning/doffing sling/immobilizer: Maximal assistance;Patient able to independently direct caregiver Correct positioning of sling/immobilizer: Moderate assistance;Patient able to independently direct caregiver Pendulum exercises (written home exercise program): Patient able to independently direct caregiver ROM for elbow, wrist and digits of operated UE: Patient able to independently direct caregiver Sling wearing schedule (on at all times/off for ADL's): Patient able  to independently direct caregiver Proper positioning of operated UE when showering: Patient able to independently direct caregiver Dressing change:  (N/A) Positioning of UE while sleeping: Patient able to independently direct caregiver    Home Living Family/patient expects to be discharged to:: Private residence Living Arrangements: Spouse/significant other Available Help at Discharge: Family Type of Home: House Home Access: Stairs to enter Secretary/administrator of Steps: 1+1 Entrance Stairs-Rails: Right Home Layout: Two level;Full bath on main level;Able to live on main level with bedroom/bathroom     Bathroom Shower/Tub: Walk-in shower;Tub only   Bathroom Toilet: Handicapped height     Home Equipment: Walker - 2 wheels;Cane - quad;Grab bars - tub/shower;Grab bars - toilet;Shower seat;Hand held shower head          Prior Functioning/Environment Level of Independence: Independent with assistive device(s)        Comments: RW to ambulate        OT Problem List: Pain;Impaired UE functional use;Obesity;Decreased knowledge of precautions;Decreased knowledge of use of DME or AE;Decreased safety awareness;Impaired balance (sitting and/or standing);Decreased activity tolerance      OT Treatment/Interventions: Self-care/ADL training;Therapeutic exercise;DME and/or AE instruction;Therapeutic activities;Patient/family education;Balance training    OT Goals(Current goals can be found in the care plan section) Acute Rehab OT Goals Patient Stated Goal: home with family support OT Goal Formulation: With patient Time For Goal Achievement: 08/12/20 Potential to Achieve Goals: Good  OT Frequency: Min 2X/week    AM-PAC OT "6 Clicks" Daily Activity     Outcome Measure Help from another person eating meals?: A Little Help from another person taking care of personal grooming?: A Little Help from another person toileting,  which includes using toliet, bedpan, or urinal?: A Lot Help from  another person bathing (including washing, rinsing, drying)?: A Lot Help from another person to put on and taking off regular upper body clothing?: A Lot Help from another person to put on and taking off regular lower body clothing?: Total 6 Click Score: 13   End of Session Equipment Utilized During Treatment: Other (comment);Rolling walker (sling) Nurse Communication: Mobility status  Activity Tolerance: Patient tolerated treatment well Patient left: Other (comment);with call bell/phone within reach (seated EOB)  OT Visit Diagnosis: Pain;History of falling (Z91.81) Pain - Right/Left: Right Pain - part of body: Shoulder                Time: 1610-9604 OT Time Calculation (min): 30 min Charges:  OT General Charges $OT Visit: 1 Visit OT Evaluation $OT Eval Low Complexity: 1 Low OT Treatments $Self Care/Home Management : 8-22 mins  Marlyce Huge OT OT pager: [phone]  Carmelia Roller 07/29/2020, 11:49 AM

## 2020-07-29 NOTE — TOC Transition Note (Signed)
Transition of Care [facility]) - CM/SW Discharge Note   Patient Details  Name: Jillian Leblanc MRN: [id number] Date of Birth: November 27, 1945  Transition of Care [facility]) CM/SW Contact:  Ida Rogue, LCSW Phone Number: 07/29/2020, 10:29 AM   Clinical Narrative:   Patient who is stable for d/c will return home with husband in [facility].  Daughter and granddaughter live close by, and are available to help.  She has all needed DME, is open to a [facility] PT referral. Cindie with [facility] confirms they will provide services in home. No further needs identified.  TOC sign off.    Final next level of care: Home w Home Health Services Barriers to Discharge: No Barriers Identified   Patient Goals and CMS Choice     Choice offered to / list presented to : Patient  Discharge Placement                       Discharge Plan and Services                            [facility] Agency: [facility] Date [facility] Agency Contacted: 07/29/20 Time HH Agency Contacted: 1029 Representative spoke with at [facility] Agency: Cindie  Social Determinants of Health (SDOH) Interventions     Readmission Risk Interventions No flowsheet data found.

## 2020-07-29 NOTE — Progress Notes (Signed)
Jillian Leblanc  MRN: [id number] DOB/Age: May 21, 1945 75 y.o. [facility] Procedure: Procedure(s) (LRB): REVERSE SHOULDER ARTHROPLASTY (Right)     Subjective: Sitting up and looks good overall   PLanning on home today with family, OT worked with her  Vital Signs Temp:  [97.7 F (36.5 C)-98.8 F (37.1 C)] 98 F (36.7 C) (07/06 0424) Pulse Rate:  [66-82] 75 (07/06 0424) Resp:  [9-51] 16 (07/06 0424) BP: (102-167)/(52-89) 102/52 (07/06 0424) SpO2:  [90 %-100 %] 99 % (07/06 0424) Weight:  [90.7 kg] 90.7 kg (07/05 1515)  Lab Results Recent Labs    07/28/20 0438 07/29/20 0432  WBC 5.9 6.7  HGB 9.4* 8.7*  HCT 31.6* 30.7*  PLT 302 267   BMET Recent Labs    07/28/20 0438 07/29/20 0432  NA 130* 130*  K 4.1 4.4  CL 96* 98  CO2 25 23  GLUCOSE 118* 174*  BUN 13 10  CREATININE 1.32* 1.22*  CALCIUM 7.8* 7.3*   INR  Date Value Ref Range Status  06/22/2019 0.9 0.8 - 1.2 Final    Comment:    (NOTE) INR goal varies based on device and disease states. Performed at [facility], [address] [place], [place] [address]      Exam Dressing dry Block still working        Plan I provided a few dilaudid tablets for breakthrough pain at home if her standard Percocet 10 not working FU appt put in chart Will see as outpatient in 2 weeks  Ralene Bathe PA-C  07/29/2020, 12:24 PM Contact # [phone]

## 2020-07-29 NOTE — Care Management Important Message (Signed)
Important Message  Patient Details IM Letter given to the Patient. Name: Kalla Watson MRN: [id number] Date of Birth: 11/24/1945   Medicare Important Message Given:  Yes     Caren Macadam 07/29/2020, 9:48 AM

## 2020-07-29 NOTE — Discharge Summary (Signed)
Physician Discharge Summary  Jillian Leblanc XQJ:[id number] DOB: 13-Feb-1945 DOA: 07/25/2020  PCP: Karle Plumber, MD  Admit date: 07/25/2020 Discharge date: 07/29/2020  Admitted From: Home Disposition: Home with home health  Recommendations for Outpatient Follow-up:  Follow up with PCP in 1-2 weeks Please obtain BMP/CBC in one week your next doctors visit.   Sling immobilization for the next 2 to 3 weeks.  She may remove the sling to perform gentle pendulum exercises as well as gentle range of motion of the elbow, wrist, and hand.  No weightbearing.  She may use the right arm at waist level for light activities of daily living as her pain allows. She may shower immediately, leave the Aquacel dressing in place and we will remove it at her first postop visit. Follow-up in orthopedic office approximately 2 weeks postop. PPI twice daily, iron supplements and bowel regimen prescribed Oral ciprofloxacin twice daily for 5 more days   Discharge Condition: Stable CODE STATUS: Full code Diet recommendation: Heart healthy  Brief/Interim Summary: 75 year old with history of DM2, anemia, CKD stage IIIa, hypothyroidism admitted to the hospital for recurrent falls.  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  In the ER she was found to be anemic with hemoglobin of 6.9 received 1 unit PRBC transfusion.  Signs of dehydration and right shoulder dislocation.  Attempts for right shoulder dislocation were unsuccessful in the ED therefore orthopedic was consulted.  Found to be in iron deficiency anemia therefore received IV iron with p.o. supplements.  GI recommends conservative management for now.  She underwent right shoulder reverse arthroplasty by orthopedic on 7/5, postoperative recommendations as mentioned above.  Hemoglobin remained stable during the hospital stay therefore being discharged home with recommendations as stated above.  PT/OT recommended SNF but  patient insisted on going home rather than to rehab.  She understood the risk of going home versus rehab. Daughter updated throughout the hospital and on the day of discharge.    Assessment & Plan:   Principal Problem:   Symptomatic anemia Active Problems:   Recurrent falls   Acute kidney injury superimposed on chronic kidney disease (HCC)   Hyponatremia   Hypokalemia   Dislocated shoulder, right, initial encounter   Closed displaced fracture of coracoid process of right shoulder   Diabetes mellitus type 2 in obese (HCC)   Elevated troponin   Symptomatic anemia, iron deficiency - Admission hemoglobin 6.9.  Baseline hemoglobin 8.0.  Status post 2 units PRBC transfusion.  H hemoglobin 9.4 - Seen by GI-conservative management.  PPI twice daily - IV iron 7/3.  Will discharge on home oral iron and follow-up outpatient GI.  Bowel regimen prescribed   Hyponatremia/hypokalemia Mild dehydration -Improved  Mild rhabdomyolysis secondary to falls -Improved  Right shoulder dislocation - Status post reverse arthroplasty 7/5.  Postop instructions given by Ortho.  PT/OT recommended SNF but patient declined and wants to go home.   Recurrent falls - Echocardiogram 2021-EF 55 to 60%, grade 1 DD. - PT/OT-SNF-but patient declined wants to go home   UTI, Enterobacter -Oral Cipro prescribed upon discharge  Elevated troponin - Chronic elevation.  No signs of acute ischemia on   Hypothyroidism with elevated TSH - TSH 25, T4 normal.  Continue Synthroid.  Repeat in 3-4 weeks   Diabetes mellitus type 2 Peripheral neuropathy - Hold off on metformin.  Lantus 10 units daily, Accu-Cheks and sliding scale - Gabapentin    Body mass index is 39.05 kg/m.  Discharge Diagnoses:  Principal Problem:   Symptomatic anemia Active Problems:   Recurrent falls   Acute kidney injury superimposed on chronic kidney disease (HCC)   Hyponatremia   Hypokalemia   Dislocated shoulder, right,  initial encounter   Closed displaced fracture of coracoid process of right shoulder   Diabetes mellitus type 2 in obese (HCC)   Elevated troponin      Consultations: GI Ortho  Subjective: Feels great no complaints.  Adamant about going home versus SNF.  She understands the risk of going home from further aggressive therapy versus going home.  She thinks her husband will be able to help her.  I confirmed with the patient's daughter who is in agreement about her going home as well.  Discharge Exam: Vitals:   07/28/20 2137 07/29/20 0424  BP: (!) 147/89 (!) 102/52  Pulse: 77 75  Resp: 16 16  Temp: 97.7 F (36.5 C) 98 F (36.7 C)  SpO2: 100% 99%   Vitals:   07/28/20 1900 07/28/20 1915 07/28/20 2137 07/29/20 0424  BP: (!) 167/75 (!) 154/65 (!) 147/89 (!) 102/52  Pulse: 79 72 77 75  Resp: (!) 25 11 16 16   Temp:  97.9 F (36.6 C) 97.7 F (36.5 C) 98 F (36.7 C)  TempSrc:   Oral Oral  SpO2: 91% 96% 100% 99%  Weight:      Height:        General: Pt is alert, awake, not in acute distress Cardiovascular: RRR, S1/S2 +, no rubs, no gallops Respiratory: CTA bilaterally, no wheezing, no rhonchi Abdominal: Soft, NT, ND, bowel sounds + Extremities: no edema, no cyanosis Right upper extremity dressing in place at the surgical site  Discharge Instructions   Allergies as of 07/29/2020       Reactions   Pentosan Polysulfate Sodium    Other reaction(s): Other (See Comments) ELMIRON Y Drug hair fell out 10/21/2009 12:00:00 AM by 10/23/2009 CNA 1   Elmiron [pentosan Polysulfate]    Naloxone Other (See Comments)   Hallucinations Confusion Nightmares        Medication List     STOP taking these medications    losartan 25 MG tablet Commonly known as: Cozaar   metFORMIN 500 MG tablet Commonly known as: GLUCOPHAGE   rOPINIRole 2 MG tablet Commonly known as: REQUIP       TAKE these medications    ALEVE PM PO Take 3 tablets by mouth at bedtime.    amitriptyline 100 MG tablet Commonly known as: ELAVIL Take 100 mg by mouth at bedtime.   Aspirin Low Dose 81 MG EC tablet Generic drug: aspirin Take 81 mg by mouth daily.   ciprofloxacin 500 MG tablet Commonly known as: CIPRO Take 1 tablet (500 mg total) by mouth 2 (two) times daily for 5 days.   Farxiga 10 MG Tabs tablet Generic drug: dapagliflozin propanediol Take 10 mg by mouth daily.   ferrous sulfate 325 (65 FE) MG tablet Take 1 tablet (325 mg total) by mouth daily with breakfast. What changed: when to take this   furosemide 40 MG tablet Commonly known as: LASIX Take 40 mg by mouth 2 (two) times daily.   hydrOXYzine 25 MG tablet Commonly known as: ATARAX/VISTARIL Take 1-2 tablets by mouth at bedtime as needed for anxiety.   ibuprofen 600 MG tablet Commonly known as: ADVIL Take 600 mg by mouth every 8 (eight) hours as needed for moderate pain.   insulin glargine (1 Unit Dial) 300 UNIT/ML Solostar Pen Commonly  known as: TOUJEO Inject 15 Units into the skin daily as needed (high Glucose).   levothyroxine 125 MCG tablet Commonly known as: SYNTHROID Take 1 tablet (125 mcg total) by mouth daily.   oxybutynin 10 MG 24 hr tablet Commonly known as: DITROPAN-XL Take 10 mg by mouth daily.   oxyCODONE-acetaminophen 10-325 MG tablet Commonly known as: PERCOCET Take 1 tablet by mouth every 8 (eight) hours as needed for pain.   pantoprazole 40 MG tablet Commonly known as: PROTONIX Take 1 tablet (40 mg total) by mouth 2 (two) times daily before a meal.   pramipexole 1.5 MG tablet Commonly known as: MIRAPEX Take 1.5 mg by mouth 3 (three) times daily.   senna-docusate 8.6-50 MG tablet Commonly known as: Senokot-S Take 2 tablets by mouth at bedtime as needed for mild constipation or moderate constipation.       ASK your doctor about these medications    lidocaine 5 % Commonly known as: Lidoderm Place 1 patch onto the skin daily. Remove & Discard patch within 12  hours or as directed by MD        Follow-up Information     Karle Plumber, MD Follow up in 1 week(s).   Specialty: Internal Medicine Contact information: [address] [place] [address] [phone]         Regan Lemming, MD .   Specialty: Cardiology Contact information: [address] [place] [address] [phone]         [facility], [facility] Follow up.   Specialty: Home Health Services Why: This is the agency that will be providing phsycial and occupational therapy Contact information: [address] [place] [address] [phone]                Allergies  Allergen Reactions   Pentosan Polysulfate Sodium     Other reaction(s): Other (See Comments) ELMIRON Y Drug hair fell out 10/21/2009 12:00:00 AM by Johny Blamer CNA 1   Elmiron [Pentosan Polysulfate]    Naloxone Other (See Comments)    Hallucinations Confusion Nightmares    You were cared for by a hospitalist during your hospital stay. If you have any questions about your discharge medications or the care you received while you were in the hospital after you are discharged, you can call the unit and asked to speak with the hospitalist on call if the hospitalist that took care of you is not available. Once you are discharged, your primary care physician will handle any further medical issues. Please note that no refills for any discharge medications will be authorized once you are discharged, as it is imperative that you return to your primary care physician (or establish a relationship with a primary care physician if you do not have one) for your aftercare needs so that they can reassess your need for medications and monitor your lab values.   Procedures/Studies: DG Chest 2 View  Result Date: 07/25/2020 CLINICAL DATA:  Multiple falls.  Cough. EXAM: CHEST - 2 VIEW COMPARISON:  01/10/2020 FINDINGS: Cardiac pacemaker. Shallow inspiration. Heart size and  pulmonary vascularity are normal for technique. Lungs are clear. No pleural effusions. No pneumothorax. Calcification of the aorta. Anterior dislocation of the right shoulder is new since prior study. IMPRESSION: Shallow inspiration. No evidence of active pulmonary disease. Anterior dislocation of the right shoulder. Electronically Signed   By: Burman Nieves M.D.   On: 07/25/2020 23:21   DG Ribs Unilateral W/Chest Right  Result  Date: 07/14/2020 CLINICAL DATA:  Fall yesterday.  Pain EXAM: RIGHT RIBS AND CHEST - 3+ VIEW COMPARISON:  01/10/2020 FINDINGS: Heart size and vascularity normal. Dual lead pacemaker unchanged. Mild bibasilar atelectasis. No infiltrate or effusion or pneumothorax. Negative for right lower rib fracture. IMPRESSION: Negative for right lower rib fracture Mild bibasilar atelectasis. Electronically Signed   By: Marlan Palau M.D.   On: 07/14/2020 13:04   DG Forearm Right  Result Date: 07/14/2020 CLINICAL DATA:  Fall yesterday EXAM: RIGHT FOREARM - 2 VIEW COMPARISON:  None. FINDINGS: There is no evidence of fracture or other focal bone lesions. Soft tissues are unremarkable. IMPRESSION: Negative. Electronically Signed   By: Marlan Palau M.D.   On: 07/14/2020 13:02   CT Head Wo Contrast  Result Date: 07/25/2020 CLINICAL DATA:  Multiple falls with confusion over the past few weeks. EXAM: CT HEAD WITHOUT CONTRAST TECHNIQUE: Contiguous axial images were obtained from the base of the skull through the vertex without intravenous contrast. COMPARISON:  07/14/2020 FINDINGS: Brain: No evidence of acute infarction, hemorrhage, hydrocephalus, extra-axial collection or mass lesion/mass effect. Vascular: No hyperdense vessel or unexpected calcification. Skull: Calvarium appears intact. Sinuses/Orbits: Paranasal sinuses and mastoid air cells are clear. Other: None.  No significant changes since prior study. IMPRESSION: No acute intracranial abnormalities. Electronically Signed   By: Burman Nieves M.D.   On: 07/25/2020 23:10   CT Head Wo Contrast  Result Date: 07/14/2020 CLINICAL DATA:  Fall yesterday, hit back of head EXAM: CT HEAD WITHOUT CONTRAST CT CERVICAL SPINE WITHOUT CONTRAST TECHNIQUE: Multidetector CT imaging of the head and cervical spine was performed following the standard protocol without intravenous contrast. Multiplanar CT image reconstructions of the cervical spine were also generated. COMPARISON:  None. FINDINGS: CT HEAD FINDINGS Brain: No evidence of acute infarction, hemorrhage, hydrocephalus, extra-axial collection or mass lesion/mass effect. Vascular: No hyperdense vessel or unexpected calcification. Skull: Hyperostosis frontalis. Negative for fracture or focal lesion. Sinuses/Orbits: No acute finding. Other: None. CT CERVICAL SPINE FINDINGS Alignment: Normal. Skull base and vertebrae: No acute fracture. No primary bone lesion or focal pathologic process. Soft tissues and spinal canal: No prevertebral fluid or swelling. No visible canal hematoma. Disc levels: Focally mild disc space height loss and osteophytosis at C6-C7. Disc spaces are otherwise intact. Upper chest: Negative. Other: None. IMPRESSION: 1. No acute intracranial pathology. 2. No fracture or static subluxation of the cervical spine. 3. Focally mild disc space height loss and osteophytosis at C6-C7. Disc spaces are otherwise intact. Electronically Signed   By: Lauralyn Primes M.D.   On: 07/14/2020 12:38   CT Cervical Spine Wo Contrast  Result Date: 07/14/2020 CLINICAL DATA:  Fall yesterday, hit back of head EXAM: CT HEAD WITHOUT CONTRAST CT CERVICAL SPINE WITHOUT CONTRAST TECHNIQUE: Multidetector CT imaging of the head and cervical spine was performed following the standard protocol without intravenous contrast. Multiplanar CT image reconstructions of the cervical spine were also generated. COMPARISON:  None. FINDINGS: CT HEAD FINDINGS Brain: No evidence of acute infarction, hemorrhage, hydrocephalus,  extra-axial collection or mass lesion/mass effect. Vascular: No hyperdense vessel or unexpected calcification. Skull: Hyperostosis frontalis. Negative for fracture or focal lesion. Sinuses/Orbits: No acute finding. Other: None. CT CERVICAL SPINE FINDINGS Alignment: Normal. Skull base and vertebrae: No acute fracture. No primary bone lesion or focal pathologic process. Soft tissues and spinal canal: No prevertebral fluid or swelling. No visible canal hematoma. Disc levels: Focally mild disc space height loss and osteophytosis at C6-C7. Disc spaces are otherwise intact. Upper chest: Negative.  Other: None. IMPRESSION: 1. No acute intracranial pathology. 2. No fracture or static subluxation of the cervical spine. 3. Focally mild disc space height loss and osteophytosis at C6-C7. Disc spaces are otherwise intact. Electronically Signed   By: Lauralyn PrimesAlex  Bibbey M.D.   On: 07/14/2020 12:38   CT Shoulder Right Wo Contrast  Result Date: 07/26/2020 CLINICAL DATA:  Shoulder dislocation and reduction attempt EXAM: CT OF THE UPPER RIGHT EXTREMITY WITHOUT CONTRAST TECHNIQUE: Multidetector CT imaging of the upper right extremity was performed according to the standard protocol. COMPARISON:  None. FINDINGS: Bones/Joint/Cartilage Anterior dislocation of the glenohumeral joint. There is a small fracture fragment displaced from the coracoid process. Ligaments Suboptimally assessed by CT. Muscles and Tendons Unremarkable Soft tissues Soft tissue swelling. IMPRESSION: Anterior dislocation of the glenohumeral joint with small fracture fragment displaced from the coracoid process. Electronically Signed   By: Deatra RobinsonKevin  Herman M.D.   On: 07/26/2020 02:20   DG Shoulder Right Port  Result Date: 07/26/2020 CLINICAL DATA:  Multiple falls. Right shoulder dislocation on chest radiograph. EXAM: PORTABLE RIGHT SHOULDER COMPARISON:  Right humerus 07/14/2020 FINDINGS: Anterior dislocation of the right shoulder with anterior and inferior displacement of  the humeral head with respect to the glenoid. No associated fractures are demonstrated. Acromioclavicular joint is intact. Changes are new since previous study. IMPRESSION: Anterior dislocation of the right shoulder. Electronically Signed   By: Burman NievesWilliam  Stevens M.D.   On: 07/26/2020 00:07   DG Humerus Right  Result Date: 07/14/2020 CLINICAL DATA:  Fall yesterday. EXAM: RIGHT HUMERUS - 2+ VIEW COMPARISON:  None. FINDINGS: There is no evidence of fracture or other focal bone lesions. Soft tissues are unremarkable. IMPRESSION: Negative. Electronically Signed   By: Marlan Palauharles  Clark M.D.   On: 07/14/2020 13:00   DG Hand Complete Right  Result Date: 07/14/2020 CLINICAL DATA:  Fall yesterday EXAM: RIGHT HAND - COMPLETE 3+ VIEW COMPARISON:  None. FINDINGS: Negative for fracture Osteoarthritis. Degenerative changes in the D IP joints and in the second MCP joint. IMPRESSION: Negative for fracture. Electronically Signed   By: Marlan Palauharles  Clark M.D.   On: 07/14/2020 13:03     The results of significant diagnostics from this hospitalization (including imaging, microbiology, ancillary and laboratory) are listed below for reference.     Microbiology: Recent Results (from the past 240 hour(s))  Resp Panel by RT-PCR (Flu A&B, Covid) Nasopharyngeal Swab     Status: None   Collection Time: 07/25/20 10:42 PM   Specimen: Nasopharyngeal Swab; Nasopharyngeal(NP) swabs in vial transport medium  Result Value Ref Range Status   SARS Coronavirus 2 by RT PCR NEGATIVE NEGATIVE Final    Comment: (NOTE) SARS-CoV-2 target nucleic acids are NOT DETECTED.  The SARS-CoV-2 RNA is generally detectable in upper respiratory specimens during the acute phase of infection. The lowest concentration of SARS-CoV-2 viral copies this assay can detect is 138 copies/mL. A negative result does not preclude SARS-Cov-2 infection and should not be used as the sole basis for treatment or other patient management decisions. A negative result may  occur with  improper specimen collection/handling, submission of specimen other than nasopharyngeal swab, presence of viral mutation(s) within the areas targeted by this assay, and inadequate number of viral copies(<138 copies/mL). A negative result must be combined with clinical observations, patient history, and epidemiological information. The expected result is Negative.  Fact Sheet for Patients:  BloggerCourse.comhttps://www.fda.gov/media/152166/download  Fact Sheet for Healthcare Providers:  SeriousBroker.ithttps://www.fda.gov/media/152162/download  This test is no t yet approved or cleared by the [place] FDA  and  has been authorized for detection and/or diagnosis of SARS-CoV-2 by FDA under an Emergency Use Authorization (EUA). This EUA will remain  in effect (meaning this test can be used) for the duration of the COVID-19 declaration under Section 564(b)(1) of the Act, 21 U.S.C.section 360bbb-3(b)(1), unless the authorization is terminated  or revoked sooner.       Influenza A by PCR NEGATIVE NEGATIVE Final   Influenza B by PCR NEGATIVE NEGATIVE Final    Comment: (NOTE) The Xpert Xpress SARS-CoV-2/FLU/RSV plus assay is intended as an aid in the diagnosis of influenza from Nasopharyngeal swab specimens and should not be used as a sole basis for treatment. Nasal washings and aspirates are unacceptable for Xpert Xpress SARS-CoV-2/FLU/RSV testing.  Fact Sheet for Patients: BloggerCourse.com  Fact Sheet for Healthcare Providers: SeriousBroker.it  This test is not yet approved or cleared by the [place] FDA and has been authorized for detection and/or diagnosis of SARS-CoV-2 by FDA under an Emergency Use Authorization (EUA). This EUA will remain in effect (meaning this test can be used) for the duration of the COVID-19 declaration under Section 564(b)(1) of the Act, 21 U.S.C. section 360bbb-3(b)(1), unless the authorization is terminated  or revoked.  Performed at [facility], [address] [place], [place] [address]   Urine culture     Status: Abnormal   Collection Time: 07/26/20 12:14 AM   Specimen: Urine, Clean Catch  Result Value Ref Range Status   Specimen Description   Final    URINE, CLEAN CATCH Performed at [facility], [address] [place], [place] [address]    Special Requests   Final    NONE Performed at [facility], [address] [place], [place] [address]    Culture >=100,000 COLONIES/mL ENTEROBACTER CLOACAE (A)  Final   Report Status 07/29/2020 FINAL  Final   Organism ID, Bacteria ENTEROBACTER CLOACAE (A)  Final      Susceptibility   Enterobacter cloacae - MIC*    CEFAZOLIN RESISTANT Resistant     CEFEPIME <=0.12 SENSITIVE Sensitive     CIPROFLOXACIN <=0.25 SENSITIVE Sensitive     GENTAMICIN <=1 SENSITIVE Sensitive     IMIPENEM <=0.25 SENSITIVE Sensitive     NITROFURANTOIN 32 SENSITIVE Sensitive     TRIMETH/SULFA <=20 SENSITIVE Sensitive     PIP/TAZO <=4 SENSITIVE Sensitive     * >=100,000 COLONIES/mL ENTEROBACTER CLOACAE  MRSA Next Gen by PCR, Nasal     Status: Abnormal   Collection Time: 07/28/20  9:14 AM   Specimen: Nasal Mucosa; Nasal Swab  Result Value Ref Range Status   MRSA by PCR Next Gen DETECTED (A) NOT DETECTED Final    Comment: RESULT CALLED TO, READ BACK BY AND VERIFIED WITH: THURMAN T. ON 07/28/2020 @ 1230 BY MECIAL J. (NOTE) The GeneXpert MRSA Assay (FDA approved for NASAL specimens only), is one component of a comprehensive MRSA colonization surveillance program. It is not intended to diagnose MRSA infection nor to guide or monitor treatment for MRSA infections. Test performance is not FDA approved in patients less than 60 years old. Performed at [facility], [address] [place], [place] [address]      Labs: BNP (last 3 results) Recent Labs    01/10/20 1604  BNP 68.8   Basic Metabolic Panel: Recent  Labs  Lab 07/25/20 2242 07/26/20 0528 07/27/20 0547 07/28/20 0438 07/29/20 0432  NA 125* 129* 132* 130* 130*  K 3.0* 3.1*  3.9 4.1 4.4  CL 85* 90* 96* 96* 98  CO2 29 29 27 25 23   GLUCOSE 200* 123* 138* 118* 174*  BUN 20 18 14 13 10   CREATININE 1.48* 1.36* 1.23* 1.32* 1.22*  CALCIUM 7.0* 7.4* 7.4* 7.8* 7.3*  MG  --  2.2 2.2 2.1 1.8   Liver Function Tests: Recent Labs  Lab 07/25/20 2242  AST 26  ALT 22  ALKPHOS 65  BILITOT 0.7  PROT 7.1  ALBUMIN 3.2*   No results for input(s): LIPASE, AMYLASE in the last 168 hours. No results for input(s): AMMONIA in the last 168 hours. CBC: Recent Labs  Lab 07/25/20 2242 07/26/20 0528 07/27/20 0547 07/28/20 0438 07/29/20 0432  WBC 6.1 4.6 4.8 5.9 6.7  NEUTROABS 4.7  --   --   --   --   HGB 6.9* 7.2* 8.8* 9.4* 8.7*  HCT 22.3* 24.4* 29.2* 31.6* 30.7*  MCV 66.4* 68.7* 74.5* 75.1* 79.7*  PLT 347 328 287 302 267   Cardiac Enzymes: Recent Labs  Lab 07/26/20 0650  CKTOTAL 500*   BNP: Invalid input(s): POCBNP CBG: Recent Labs  Lab 07/28/20 1122 07/28/20 1508 07/28/20 1830 07/28/20 2138 07/29/20 0800  GLUCAP 103* 111* 119* 178* 117*   D-Dimer No results for input(s): DDIMER in the last 72 hours. Hgb A1c No results for input(s): HGBA1C in the last 72 hours. Lipid Profile No results for input(s): CHOL, HDL, LDLCALC, TRIG, CHOLHDL, LDLDIRECT in the last 72 hours. Thyroid function studies No results for input(s): TSH, T4TOTAL, T3FREE, THYROIDAB in the last 72 hours.  Invalid input(s): FREET3 Anemia work up No results for input(s): VITAMINB12, FOLATE, FERRITIN, TIBC, IRON, RETICCTPCT in the last 72 hours. Urinalysis    Component Value Date/Time   COLORURINE YELLOW 07/25/2020 0014   APPEARANCEUR CLOUDY (A) 07/25/2020 0014   LABSPEC 1.010 07/25/2020 0014   PHURINE 5.5 07/25/2020 0014   GLUCOSEU >=500 (A) 07/25/2020 0014   HGBUR TRACE (A) 07/25/2020 0014   BILIRUBINUR NEGATIVE 07/25/2020 0014   KETONESUR NEGATIVE  07/25/2020 0014   PROTEINUR NEGATIVE 07/25/2020 0014   UROBILINOGEN 0.2 09/15/2012 1536   NITRITE NEGATIVE 07/25/2020 0014   LEUKOCYTESUR MODERATE (A) 07/25/2020 0014   Sepsis Labs Invalid input(s): PROCALCITONIN,  WBC,  LACTICIDVEN Microbiology Recent Results (from the past 240 hour(s))  Resp Panel by RT-PCR (Flu A&B, Covid) Nasopharyngeal Swab     Status: None   Collection Time: 07/25/20 10:42 PM   Specimen: Nasopharyngeal Swab; Nasopharyngeal(NP) swabs in vial transport medium  Result Value Ref Range Status   SARS Coronavirus 2 by RT PCR NEGATIVE NEGATIVE Final    Comment: (NOTE) SARS-CoV-2 target nucleic acids are NOT DETECTED.  The SARS-CoV-2 RNA is generally detectable in upper respiratory specimens during the acute phase of infection. The lowest concentration of SARS-CoV-2 viral copies this assay can detect is 138 copies/mL. A negative result does not preclude SARS-Cov-2 infection and should not be used as the sole basis for treatment or other patient management decisions. A negative result may occur with  improper specimen collection/handling, submission of specimen other than nasopharyngeal swab, presence of viral mutation(s) within the areas targeted by this assay, and inadequate number of viral copies(<138 copies/mL). A negative result must be combined with clinical observations, patient history, and epidemiological information. The expected result is Negative.  Fact Sheet for Patients:  09/25/2020  Fact Sheet for Healthcare Providers:  09/25/20  This test is no t yet approved or cleared by the [employer] FDA and  has  been authorized for detection and/or diagnosis of SARS-CoV-2 by FDA under an Emergency Use Authorization (EUA). This EUA will remain  in effect (meaning this test can be used) for the duration of the COVID-19 declaration under Section 564(b)(1) of the Act, 21 U.S.C.section  360bbb-3(b)(1), unless the authorization is terminated  or revoked sooner.       Influenza A by PCR NEGATIVE NEGATIVE Final   Influenza B by PCR NEGATIVE NEGATIVE Final    Comment: (NOTE) The Xpert Xpress SARS-CoV-2/FLU/RSV plus assay is intended as an aid in the diagnosis of influenza from Nasopharyngeal swab specimens and should not be used as a sole basis for treatment. Nasal washings and aspirates are unacceptable for Xpert Xpress SARS-CoV-2/FLU/RSV testing.  Fact Sheet for Patients: [employer]  Fact Sheet for Healthcare Providers: [employer]  This test is not yet approved or cleared by the [place] FDA and has been authorized for detection and/or diagnosis of SARS-CoV-2 by FDA under an Emergency Use Authorization (EUA). This EUA will remain in effect (meaning this test can be used) for the duration of the COVID-19 declaration under Section 564(b)(1) of the Act, 21 U.S.C. section 360bbb-3(b)(1), unless the authorization is terminated or revoked.  Performed at [facility], [address] [place], [place] [address]   Urine culture     Status: Abnormal   Collection Time: 07/26/20 12:14 AM   Specimen: Urine, Clean Catch  Result Value Ref Range Status   Specimen Description   Final    URINE, CLEAN CATCH Performed at [facility], [address] [place], [place] [address]    Special Requests   Final    NONE Performed at [facility], [address] [place], [place] [address]    Culture >=100,000 COLONIES/mL ENTEROBACTER CLOACAE (A)  Final   Report Status 07/29/2020 FINAL  Final   Organism ID, Bacteria ENTEROBACTER CLOACAE (A)  Final      Susceptibility   Enterobacter cloacae - MIC*    CEFAZOLIN RESISTANT Resistant     CEFEPIME <=0.12 SENSITIVE Sensitive     CIPROFLOXACIN <=0.25 SENSITIVE Sensitive     GENTAMICIN <=1 SENSITIVE Sensitive     IMIPENEM <=0.25 SENSITIVE  Sensitive     NITROFURANTOIN 32 SENSITIVE Sensitive     TRIMETH/SULFA <=20 SENSITIVE Sensitive     PIP/TAZO <=4 SENSITIVE Sensitive     * >=100,000 COLONIES/mL ENTEROBACTER CLOACAE  MRSA Next Gen by PCR, Nasal     Status: Abnormal   Collection Time: 07/28/20  9:14 AM   Specimen: Nasal Mucosa; Nasal Swab  Result Value Ref Range Status   MRSA by PCR Next Gen DETECTED (A) NOT DETECTED Final    Comment: RESULT CALLED TO, READ BACK BY AND VERIFIED WITH: THURMAN T. ON 07/28/2020 @ 1230 BY MECIAL J. (NOTE) The GeneXpert MRSA Assay (FDA approved for NASAL specimens only), is one component of a comprehensive MRSA colonization surveillance program. It is not intended to diagnose MRSA infection nor to guide or monitor treatment for MRSA infections. Test performance is not FDA approved in patients less than 27 years old. Performed at [facility], [address] [place], [place] [address]      Time coordinating discharge:  I have spent 35 minutes face to face with the patient and on the ward discussing the patients care, assessment, plan and disposition with other care givers. >50% of the time was devoted counseling the patient about the risks and benefits of treatment/Discharge disposition  and coordinating care.   SIGNED:   Dimple Nanas, MD  [facility] 07/29/2020, 11:27 AM   If 7PM-7AM, please contact night-coverage

## 2020-07-30 ENCOUNTER — Encounter (HOSPITAL_COMMUNITY): Payer: Self-pay | Admitting: Orthopedic Surgery

## 2020-07-30 NOTE — Anesthesia Postprocedure Evaluation (Signed)
Anesthesia Post Note  Patient: Jillian Leblanc  Procedure(s) Performed: REVERSE SHOULDER ARTHROPLASTY (Right: Shoulder)     Patient location during evaluation: PACU Anesthesia Type: General Level of consciousness: awake and alert Pain management: pain level controlled Vital Signs Assessment: post-procedure vital signs reviewed and stable Respiratory status: spontaneous breathing, nonlabored ventilation, respiratory function stable and patient connected to nasal cannula oxygen Cardiovascular status: blood pressure returned to baseline and stable Postop Assessment: no apparent nausea or vomiting Anesthetic complications: no   No notable events documented.  Last Vitals:  Vitals:   07/28/20 2137 07/29/20 0424  BP: (!) 147/89 (!) 102/52  Pulse: 77 75  Resp: 16 16  Temp: 36.5 C 36.7 C  SpO2: 100% 99%    Last Pain:  Vitals:   07/29/20 0921  TempSrc:   PainSc: 5                  [name] S

## 2020-08-17 ENCOUNTER — Ambulatory Visit (INDEPENDENT_AMBULATORY_CARE_PROVIDER_SITE_OTHER): Payer: Medicare Other

## 2020-08-17 DIAGNOSIS — I495 Sick sinus syndrome: Secondary | ICD-10-CM

## 2020-08-19 LAB — CUP PACEART REMOTE DEVICE CHECK
Battery Remaining Longevity: 165 mo
Battery Voltage: 3.11 V
Brady Statistic AP VP Percent: 0.01 %
Brady Statistic AP VS Percent: 22.95 %
Brady Statistic AS VP Percent: 0.03 %
Brady Statistic AS VS Percent: 77.01 %
Brady Statistic RA Percent Paced: 22.84 %
Brady Statistic RV Percent Paced: 0.04 %
Date Time Interrogation Session: 20220725003708
Implantable Lead Implant Date: 20210723
Implantable Lead Implant Date: 20210723
Implantable Lead Location: 753859
Implantable Lead Location: 753860
Implantable Lead Model: 5076
Implantable Lead Model: 5076
Implantable Pulse Generator Implant Date: 20210723
Lead Channel Impedance Value: 304 Ohm
Lead Channel Impedance Value: 323 Ohm
Lead Channel Impedance Value: 342 Ohm
Lead Channel Impedance Value: 437 Ohm
Lead Channel Pacing Threshold Amplitude: 0.5 V
Lead Channel Pacing Threshold Amplitude: 1 V
Lead Channel Pacing Threshold Pulse Width: 0.4 ms
Lead Channel Pacing Threshold Pulse Width: 0.4 ms
Lead Channel Sensing Intrinsic Amplitude: 2.875 mV
Lead Channel Sensing Intrinsic Amplitude: 2.875 mV
Lead Channel Sensing Intrinsic Amplitude: 8.875 mV
Lead Channel Sensing Intrinsic Amplitude: 8.875 mV
Lead Channel Setting Pacing Amplitude: 1.5 V
Lead Channel Setting Pacing Amplitude: 2.5 V
Lead Channel Setting Pacing Pulse Width: 0.4 ms
Lead Channel Setting Sensing Sensitivity: 0.9 mV

## 2020-09-10 NOTE — Progress Notes (Signed)
Remote pacemaker transmission.   

## 2020-09-13 ENCOUNTER — Emergency Department (HOSPITAL_BASED_OUTPATIENT_CLINIC_OR_DEPARTMENT_OTHER): Payer: Medicare Other

## 2020-09-13 ENCOUNTER — Observation Stay (HOSPITAL_BASED_OUTPATIENT_CLINIC_OR_DEPARTMENT_OTHER)
Admission: EM | Admit: 2020-09-13 | Discharge: 2020-09-15 | Disposition: A | Discharge status: Home / Self Care | Payer: Medicare Other | Attending: Family Medicine | Admitting: Family Medicine

## 2020-09-13 ENCOUNTER — Other Ambulatory Visit: Payer: Self-pay

## 2020-09-13 ENCOUNTER — Encounter (HOSPITAL_BASED_OUTPATIENT_CLINIC_OR_DEPARTMENT_OTHER): Payer: Self-pay | Admitting: *Deleted

## 2020-09-13 DIAGNOSIS — R531 Weakness: Secondary | ICD-10-CM | POA: Insufficient documentation

## 2020-09-13 DIAGNOSIS — W19XXXA Unspecified fall, initial encounter: Secondary | ICD-10-CM | POA: Diagnosis not present

## 2020-09-13 DIAGNOSIS — R296 Repeated falls: Secondary | ICD-10-CM | POA: Diagnosis not present

## 2020-09-13 DIAGNOSIS — S0992XA Unspecified injury of nose, initial encounter: Secondary | ICD-10-CM | POA: Diagnosis present

## 2020-09-13 DIAGNOSIS — S0031XA Abrasion of nose, initial encounter: Principal | ICD-10-CM | POA: Insufficient documentation

## 2020-09-13 DIAGNOSIS — E1122 Type 2 diabetes mellitus with diabetic chronic kidney disease: Secondary | ICD-10-CM | POA: Insufficient documentation

## 2020-09-13 DIAGNOSIS — R7989 Other specified abnormal findings of blood chemistry: Secondary | ICD-10-CM

## 2020-09-13 DIAGNOSIS — Z20822 Contact with and (suspected) exposure to covid-19: Secondary | ICD-10-CM | POA: Insufficient documentation

## 2020-09-13 DIAGNOSIS — R778 Other specified abnormalities of plasma proteins: Secondary | ICD-10-CM | POA: Insufficient documentation

## 2020-09-13 DIAGNOSIS — E669 Obesity, unspecified: Secondary | ICD-10-CM | POA: Diagnosis present

## 2020-09-13 DIAGNOSIS — Y92009 Unspecified place in unspecified non-institutional (private) residence as the place of occurrence of the external cause: Secondary | ICD-10-CM | POA: Diagnosis not present

## 2020-09-13 DIAGNOSIS — L8990 Pressure ulcer of unspecified site, unspecified stage: Secondary | ICD-10-CM | POA: Insufficient documentation

## 2020-09-13 DIAGNOSIS — E1169 Type 2 diabetes mellitus with other specified complication: Secondary | ICD-10-CM | POA: Diagnosis present

## 2020-09-13 DIAGNOSIS — N1831 Chronic kidney disease, stage 3a: Secondary | ICD-10-CM | POA: Insufficient documentation

## 2020-09-13 DIAGNOSIS — T148XXA Other injury of unspecified body region, initial encounter: Secondary | ICD-10-CM

## 2020-09-13 NOTE — ED Notes (Addendum)
Patient lying on stretcher with ABCs intact, alert and oriented x4, respirations even and unlabored. Patient here after falling at home around 2030 tonight. Patient and family state that she has been falling for "awhile". States feels like legs are weak and light headed which is causing her to fall. Recently had surgery on right arm due to fx 2nd to falling. Patient states fell and hit head and right shoulder. Denies LOC. Patient also fell in parking lot here at ED. States fell after getting out of car, hit ground and hit face on car causing glasses to cut into nose. Bleeding stopped. States also hit left arm when falling in parking lot. Complaining of right arm pain from fall at home.  Patient denies CP, +SOB. Family in room, stretcher in low locked position with side rails up x2, call bell in reach. Connected to cardiac monitor, currently in sinus rhythm.

## 2020-09-13 NOTE — ED Triage Notes (Addendum)
Pt has had frequent falls this year and had to have shoulder surgery after injury from fall in July.  Pt reports that she was feeling generally weak today and had a fall around noon (was lowered to the ground and denies any injuries from that) pt continued to feel "shaky and weak" at home and decided to come to ER.  Pt fell while getting out of the car here at the ED and has a scratch to her nose from this as well as increased right shoulder pain (adjusted pt sling which is in place).  No focal weakness, pt is alert and oriented.

## 2020-09-14 ENCOUNTER — Encounter (HOSPITAL_BASED_OUTPATIENT_CLINIC_OR_DEPARTMENT_OTHER): Payer: Self-pay | Admitting: Emergency Medicine

## 2020-09-14 DIAGNOSIS — Z20822 Contact with and (suspected) exposure to covid-19: Secondary | ICD-10-CM | POA: Diagnosis not present

## 2020-09-14 DIAGNOSIS — R296 Repeated falls: Secondary | ICD-10-CM

## 2020-09-14 DIAGNOSIS — E1122 Type 2 diabetes mellitus with diabetic chronic kidney disease: Secondary | ICD-10-CM | POA: Diagnosis not present

## 2020-09-14 DIAGNOSIS — Y92009 Unspecified place in unspecified non-institutional (private) residence as the place of occurrence of the external cause: Secondary | ICD-10-CM | POA: Diagnosis not present

## 2020-09-14 DIAGNOSIS — R531 Weakness: Secondary | ICD-10-CM | POA: Diagnosis not present

## 2020-09-14 DIAGNOSIS — W19XXXA Unspecified fall, initial encounter: Secondary | ICD-10-CM | POA: Diagnosis not present

## 2020-09-14 DIAGNOSIS — L8990 Pressure ulcer of unspecified site, unspecified stage: Secondary | ICD-10-CM | POA: Insufficient documentation

## 2020-09-14 DIAGNOSIS — S0992XA Unspecified injury of nose, initial encounter: Secondary | ICD-10-CM | POA: Diagnosis present

## 2020-09-14 DIAGNOSIS — S0031XA Abrasion of nose, initial encounter: Secondary | ICD-10-CM | POA: Diagnosis not present

## 2020-09-14 DIAGNOSIS — N1831 Chronic kidney disease, stage 3a: Secondary | ICD-10-CM | POA: Diagnosis not present

## 2020-09-14 DIAGNOSIS — R778 Other specified abnormalities of plasma proteins: Secondary | ICD-10-CM | POA: Diagnosis not present

## 2020-09-14 LAB — RESP PANEL BY RT-PCR (FLU A&B, COVID) ARPGX2
Influenza A by PCR: NEGATIVE
Influenza B by PCR: NEGATIVE
SARS Coronavirus 2 by RT PCR: NEGATIVE

## 2020-09-14 LAB — MAGNESIUM: Magnesium: 2.2 mg/dL (ref 1.7–2.4)

## 2020-09-14 LAB — CBC WITH DIFFERENTIAL/PLATELET
Abs Immature Granulocytes: 0.02 10*3/uL (ref 0.00–0.07)
Basophils Absolute: 0 10*3/uL (ref 0.0–0.1)
Basophils Relative: 1 %
Eosinophils Absolute: 0.2 10*3/uL (ref 0.0–0.5)
Eosinophils Relative: 4 %
HCT: 37.2 % (ref 36.0–46.0)
Hemoglobin: 12.5 g/dL (ref 12.0–15.0)
Immature Granulocytes: 0 %
Lymphocytes Relative: 26 %
Lymphs Abs: 1.7 10*3/uL (ref 0.7–4.0)
MCH: 28.5 pg (ref 26.0–34.0)
MCHC: 33.6 g/dL (ref 30.0–36.0)
MCV: 84.9 fL (ref 80.0–100.0)
Monocytes Absolute: 0.5 10*3/uL (ref 0.1–1.0)
Monocytes Relative: 8 %
Neutro Abs: 4.1 10*3/uL (ref 1.7–7.7)
Neutrophils Relative %: 61 %
Platelets: 230 10*3/uL (ref 150–400)
RBC: 4.38 MIL/uL (ref 3.87–5.11)
RDW: 25.5 % — ABNORMAL HIGH (ref 11.5–15.5)
Smear Review: NORMAL
WBC: 6.6 10*3/uL (ref 4.0–10.5)
nRBC: 0 % (ref 0.0–0.2)

## 2020-09-14 LAB — COMPREHENSIVE METABOLIC PANEL
ALT: 14 U/L (ref 0–44)
AST: 15 U/L (ref 15–41)
Albumin: 3.9 g/dL (ref 3.5–5.0)
Alkaline Phosphatase: 118 U/L (ref 38–126)
Anion gap: 9 (ref 5–15)
BUN: 15 mg/dL (ref 8–23)
CO2: 26 mmol/L (ref 22–32)
Calcium: 7.8 mg/dL — ABNORMAL LOW (ref 8.9–10.3)
Chloride: 99 mmol/L (ref 98–111)
Creatinine, Ser: 1.15 mg/dL — ABNORMAL HIGH (ref 0.44–1.00)
GFR, Estimated: 50 mL/min — ABNORMAL LOW (ref >60)
Glucose, Bld: 316 mg/dL — ABNORMAL HIGH (ref 70–99)
Potassium: 4 mmol/L (ref 3.5–5.1)
Sodium: 134 mmol/L — ABNORMAL LOW (ref 135–145)
Total Bilirubin: 0.3 mg/dL (ref 0.3–1.2)
Total Protein: 8.1 g/dL (ref 6.5–8.1)

## 2020-09-14 LAB — GLUCOSE, CAPILLARY: Glucose-Capillary: 189 mg/dL — ABNORMAL HIGH (ref 70–99)

## 2020-09-14 LAB — URINALYSIS, ROUTINE W REFLEX MICROSCOPIC
Bilirubin Urine: NEGATIVE
Glucose, UA: >=500 mg/dL — AB
Hgb urine dipstick: NEGATIVE
Ketones, ur: NEGATIVE mg/dL
Leukocytes,Ua: NEGATIVE
Nitrite: NEGATIVE
Protein, ur: NEGATIVE mg/dL
Specific Gravity, Urine: 1.015 (ref 1.005–1.030)
pH: 5 (ref 5.0–8.0)

## 2020-09-14 LAB — URINALYSIS, MICROSCOPIC (REFLEX)

## 2020-09-14 LAB — TROPONIN I (HIGH SENSITIVITY)
Troponin I (High Sensitivity): 62 ng/L — ABNORMAL HIGH (ref <18)
Troponin I (High Sensitivity): 62 ng/L — ABNORMAL HIGH (ref <18)

## 2020-09-14 LAB — TSH: TSH: 9.21 u[IU]/mL — ABNORMAL HIGH (ref 0.350–4.500)

## 2020-09-14 MED ORDER — INSULIN REGULAR HUMAN 100 UNIT/ML IJ SOLN
2.0000 [IU] | Freq: Once | INTRAMUSCULAR | Status: DC
  Filled 2020-09-14: qty 3

## 2020-09-14 MED ORDER — ASPIRIN EC 81 MG PO TBEC
81.0000 mg | DELAYED_RELEASE_TABLET | Freq: Every day | ORAL | Status: DC
  Administered 2020-09-14 – 2020-09-15 (×2): 81 mg via ORAL
  Filled 2020-09-14 (×2): qty 1

## 2020-09-14 MED ORDER — OXYCODONE HCL 5 MG PO TABS
5.0000 mg | ORAL_TABLET | Freq: Once | ORAL | Status: AC
  Administered 2020-09-14: 5 mg via ORAL
  Filled 2020-09-14: qty 1

## 2020-09-14 MED ORDER — AMITRIPTYLINE HCL 25 MG PO TABS
100.0000 mg | ORAL_TABLET | Freq: Every day | ORAL | Status: DC
  Administered 2020-09-14: 100 mg via ORAL
  Filled 2020-09-14: qty 4

## 2020-09-14 MED ORDER — HEPARIN SODIUM (PORCINE) 5000 UNIT/ML IJ SOLN
5000.0000 [IU] | Freq: Three times a day (TID) | INTRAMUSCULAR | Status: DC
  Administered 2020-09-14 – 2020-09-15 (×2): 5000 [IU] via SUBCUTANEOUS
  Filled 2020-09-14 (×2): qty 1

## 2020-09-14 MED ORDER — CALCIUM CARBONATE ANTACID 500 MG PO CHEW
2.0000 | CHEWABLE_TABLET | Freq: Once | ORAL | Status: AC
  Administered 2020-09-14: 400 mg via ORAL
  Filled 2020-09-14: qty 2

## 2020-09-14 MED ORDER — PRAMIPEXOLE DIHYDROCHLORIDE 1 MG PO TABS
1.5000 mg | ORAL_TABLET | Freq: Three times a day (TID) | ORAL | Status: DC
  Administered 2020-09-14 – 2020-09-15 (×2): 1.5 mg via ORAL
  Filled 2020-09-14 (×3): qty 2

## 2020-09-14 MED ORDER — CALCIUM CITRATE-VITAMIN D 250-100 MG-UNIT PO TABS: 1.0000 | ORAL_TABLET | Freq: Two times a day (BID) | ORAL | 0 refills | Status: AC

## 2020-09-14 MED ORDER — ONDANSETRON HCL 4 MG PO TABS: 4.0000 mg | ORAL_TABLET | Freq: Four times a day (QID) | ORAL | Status: DC | PRN

## 2020-09-14 MED ORDER — OXYBUTYNIN CHLORIDE ER 5 MG PO TB24: 10.0000 mg | ORAL_TABLET | Freq: Every day | ORAL | Status: DC

## 2020-09-14 MED ORDER — LIDOCAINE 5 % EX PTCH
3.0000 | MEDICATED_PATCH | CUTANEOUS | Status: DC
  Administered 2020-09-14 – 2020-09-15 (×2): 3 via TRANSDERMAL
  Filled 2020-09-14 (×2): qty 3

## 2020-09-14 MED ORDER — ONDANSETRON HCL 4 MG/2ML IJ SOLN: 4.0000 mg | Freq: Four times a day (QID) | INTRAMUSCULAR | Status: DC | PRN

## 2020-09-14 MED ORDER — FERROUS SULFATE 325 (65 FE) MG PO TABS
325.0000 mg | ORAL_TABLET | Freq: Every day | ORAL | Status: DC
  Administered 2020-09-15: 325 mg via ORAL
  Filled 2020-09-14: qty 1

## 2020-09-14 MED ORDER — SENNOSIDES-DOCUSATE SODIUM 8.6-50 MG PO TABS: 2.0000 | ORAL_TABLET | Freq: Every evening | ORAL | Status: DC | PRN

## 2020-09-14 MED ORDER — SODIUM CHLORIDE 0.9 % IV SOLN: INTRAVENOUS | Status: AC

## 2020-09-14 MED ORDER — ACETAMINOPHEN 650 MG RE SUPP: 650.0000 mg | Freq: Four times a day (QID) | RECTAL | Status: DC | PRN

## 2020-09-14 MED ORDER — LEVOTHYROXINE SODIUM 75 MCG PO TABS
150.0000 ug | ORAL_TABLET | Freq: Every day | ORAL | Status: DC
  Administered 2020-09-15: 150 ug via ORAL
  Filled 2020-09-14: qty 2

## 2020-09-14 MED ORDER — KETOROLAC TROMETHAMINE 30 MG/ML IJ SOLN
15.0000 mg | Freq: Once | INTRAMUSCULAR | Status: AC
  Administered 2020-09-14: 15 mg via INTRAVENOUS
  Filled 2020-09-14: qty 1

## 2020-09-14 MED ORDER — ACETAMINOPHEN 325 MG PO TABS: 650.0000 mg | ORAL_TABLET | Freq: Four times a day (QID) | ORAL | Status: DC | PRN

## 2020-09-14 MED ORDER — PANTOPRAZOLE SODIUM 40 MG PO TBEC: 40.0000 mg | DELAYED_RELEASE_TABLET | Freq: Two times a day (BID) | ORAL | Status: DC

## 2020-09-14 MED ORDER — LIDOCAINE 5 % EX PTCH: 1.0000 | MEDICATED_PATCH | CUTANEOUS | 0 refills | Status: DC

## 2020-09-14 MED ORDER — LEVOTHYROXINE SODIUM 25 MCG PO TABS: 125.0000 ug | ORAL_TABLET | Freq: Every day | ORAL | Status: DC

## 2020-09-14 MED ORDER — SODIUM CHLORIDE 0.9% FLUSH
3.0000 mL | Freq: Two times a day (BID) | INTRAVENOUS | Status: DC
  Administered 2020-09-15 (×2): 3 mL via INTRAVENOUS

## 2020-09-14 MED ORDER — INSULIN ASPART 100 UNIT/ML IJ SOLN
0.0000 [IU] | Freq: Every day | INTRAMUSCULAR | Status: DC
Start: 2020-09-14 — End: 2020-09-15

## 2020-09-14 MED ORDER — INSULIN ASPART 100 UNIT/ML IJ SOLN
0.0000 [IU] | Freq: Three times a day (TID) | INTRAMUSCULAR | Status: DC
  Administered 2020-09-15: 2 [IU] via SUBCUTANEOUS

## 2020-09-14 MED ORDER — DAPAGLIFLOZIN PROPANEDIOL 10 MG PO TABS
10.0000 mg | ORAL_TABLET | Freq: Every day | ORAL | Status: DC
  Administered 2020-09-15: 10 mg via ORAL
  Filled 2020-09-14: qty 1

## 2020-09-14 MED ORDER — INSULIN ASPART 100 UNIT/ML IJ SOLN
2.0000 [IU] | Freq: Once | INTRAMUSCULAR | Status: AC
  Administered 2020-09-14: 2 [IU] via SUBCUTANEOUS

## 2020-09-14 NOTE — Plan of Care (Signed)
  Problem: Education: Goal: Knowledge of General Education information will improve Description Including pain rating scale, medication(s)/side effects and non-pharmacologic comfort measures Outcome: Progressing   Problem: Activity: Goal: Risk for activity intolerance will decrease Outcome: Progressing   Problem: Safety: Goal: Ability to remain free from injury will improve Outcome: Progressing   

## 2020-09-14 NOTE — H&P (Addendum)
History and Physical    Jillian AbideGlenda Leblanc ZOX:[id number] DOB: February 28, 1945 DOA: 09/13/2020  PCP: [name], Moogali M, MD  Patient coming from: Home  I have personally briefly reviewed patient's old medical records in [facility]  Chief Complaint: Generalized weakness/frequent falls  HPI: Jillian Leblanc is a 75 y.o. female with medical history significant of type 2 diabetes mellitus, CKD stage IIIa [facility] with complaint of frequent falls.  According to patient, she has fallen about 5-6 times in the last 2 weeks.  She has been feeling generalized weak.  No loss of consciousness in any of those episodes of falls.  No prodromal symptoms, chest pain or shortness of breath or palpitations.  Her daughter drove her to the emergency department and she once again fell in the parking lot and ended up having abrasion on her nose.  No other complaint.  She just says " I cannot figure out why suddenly my legs just give out and I fall"  Of note, patient did have similar kind of situation last year when she had about 28 falls in 1 month.  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  ED Course: Upon arrival to ED, she was hemodynamically stable.  Extensive work-up was done which included CT head, CT maxillofacial, CT cervical spine, chest x-ray, humerus x-ray right, all of them were unremarkable.  UA unremarkable, CMP and CBC unremarkable.  Renal function at baseline.  Elevated troponin but that is also chronically elevated for her.  Hospitalist service were consulted to admit the patient for further management.  Review of Systems: As per HPI otherwise negative.    Past Medical History:  Diagnosis Date   Diabetes mellitus without complication (HCC)    Interstitial cystitis    Thyroid disease     Past Surgical History:  Procedure Laterality Date   ABDOMINAL HYSTERECTOMY     CHOLECYSTECTOMY     NECK SURGERY      PACEMAKER IMPLANT N/A 08/16/2019   Procedure: PACEMAKER IMPLANT;  Surgeon: Regan Lemmingamnitz, Will Martin, MD;  Location: [facility];  Service: Cardiovascular;  Laterality: N/A;   REVERSE SHOULDER ARTHROPLASTY Right 07/28/2020   Procedure: REVERSE SHOULDER ARTHROPLASTY;  Surgeon: Francena HanlySupple, Kevin, MD;  Location: [facility] OR[facility];  Service: Orthopedics;  Laterality: Right;   SHOULDER SURGERY     THYROIDECTOMY       reports that she has never smoked. She has never used smokeless tobacco. She reports that she does not drink alcohol and does not use drugs.  Allergies  Allergen Reactions   Pentosan Polysulfate Sodium     Other reaction(s): Other (See Comments) ELMIRON Y Drug hair fell out 10/21/2009 12:00:00 AM by Johny BlamerLaquana Glenn CNA 1   Elmiron [Pentosan Polysulfate]    Naloxone Other (See Comments)    Hallucinations Confusion Nightmares    History reviewed. No pertinent family history.  Prior to Admission medications   Medication Sig Start Date End Date Taking? Authorizing Provider  calcium-vitamin D 250-100 MG-UNIT tablet Take 1 tablet by mouth 2 (two) times daily. 09/14/20  Yes Palumbo, April, MD  lidocaine (LIDODERM) 5 % Place 1 patch onto the skin daily. Remove & Discard patch within 12 hours or as directed by MD 09/14/20  Yes Palumbo, April, MD  ondansetron (ZOFRAN-ODT) 4 MG disintegrating tablet Take by mouth. 09/02/20  Yes [provider]  amitriptyline (ELAVIL) 100 MG tablet Take 100 mg by mouth at bedtime. 08/01/19  [provider]  ASPIRIN LOW DOSE 81 MG EC tablet Take 81 mg by mouth daily. 06/16/19   [provider]  FARXIGA 10 MG TABS tablet Take 10 mg by mouth daily. 06/29/20   [provider]  ferrous sulfate 325 (65 FE) MG tablet Take 1 tablet (325 mg total) by mouth daily with breakfast. 07/29/20   Amin, Loura Halt, MD  fluticasone (FLONASE) 50 MCG/ACT nasal spray Place 1 spray into both nostrils daily. 08/06/20   [provider]  furosemide (LASIX) 40  MG tablet Take 40 mg by mouth 2 (two) times daily. 07/19/20   [provider]  gabapentin (NEURONTIN) 300 MG capsule Take 300 mg by mouth daily. 08/31/20   [provider]  HYDROmorphone (DILAUDID) 2 MG tablet Take 1 tablet (2 mg total) by mouth every 4 (four) hours as needed (for post op pain not controlled on current percocet). 07/29/20   Shuford, French Ana, PA-C  hydrOXYzine (ATARAX/VISTARIL) 25 MG tablet Take 1-2 tablets by mouth at bedtime as needed for anxiety. 06/14/19   [provider]  ibuprofen (ADVIL) 600 MG tablet Take 600 mg by mouth every 8 (eight) hours as needed for moderate pain.  06/11/19   [provider]  insulin glargine, 1 Unit Dial, (TOUJEO) 300 UNIT/ML Solostar Pen Inject 15 Units into the skin daily as needed (high Glucose).     [provider]  levothyroxine (SYNTHROID) 125 MCG tablet Take 1 tablet (125 mcg total) by mouth daily. 10/18/19   Barrett, Joline Salt, PA-C  levothyroxine (SYNTHROID) 150 MCG tablet Take 150 mcg by mouth daily. 09/01/20   [provider]  lidocaine (LIDODERM) 5 % Place 1 patch onto the skin daily. Remove & Discard patch within 12 hours or as directed by MD Patient taking differently: Place 1 patch onto the skin daily as needed (pain). Remove & Discard patch within 12 hours or as directed by MD 07/14/20   Henderly, Britni A, PA-C  Naproxen Sod-diphenhydrAMINE (ALEVE PM PO) Take 3 tablets by mouth at bedtime.    [provider]  oxybutynin (DITROPAN-XL) 10 MG 24 hr tablet Take 10 mg by mouth daily. 06/11/19   [provider]  oxyCODONE-acetaminophen (PERCOCET) 10-325 MG per tablet Take 1 tablet by mouth every 8 (eight) hours as needed for pain.     [provider]  pantoprazole (PROTONIX) 40 MG tablet Take 1 tablet (40 mg total) by mouth 2 (two) times daily before a meal. 07/29/20   Amin, Loura Halt, MD  pramipexole (MIRAPEX) 1.5 MG tablet Take 1.5 mg by mouth 3 (three) times daily. 06/29/20    [provider]  senna-docusate (SENOKOT-S) 8.6-50 MG tablet Take 2 tablets by mouth at bedtime as needed for mild constipation or moderate constipation. 07/29/20   Dimple Nanas, MD    Physical Exam: Vitals:   09/14/20 1330 09/14/20 1400 09/14/20 1415 09/14/20 1648  BP: (!) 156/119 (!) 155/109  (!) 161/76  Pulse: 72  76 76  Resp: 13  19 18   Temp:    98.1 F (36.7 C)  TempSrc:    Oral  SpO2: 96%  93% 98%  Weight:        Constitutional: NAD, calm, comfortable Vitals:   09/14/20 1330 09/14/20 1400 09/14/20 1415 09/14/20 1648  BP: (!) 156/119 (!) 155/109  (!) 161/76  Pulse: 72  76 76  Resp: 13  19 18   Temp:    98.1 F (36.7 C)  TempSrc:    Oral  SpO2: 96%  93% 98%  Weight:       Eyes: PERRL, lids and conjunctivae normal ENMT: Mucous membranes are moist. Posterior pharynx clear of any exudate or lesions.Normal dentition.  Neck: normal, supple, no masses, no thyromegaly Respiratory: clear to auscultation bilaterally, no wheezing, no crackles. Normal respiratory effort. No accessory muscle use.  Cardiovascular: Regular rate and rhythm, no murmurs / rubs / gallops. No extremity edema. 2+ pedal pulses. No carotid bruits.  Abdomen: no tenderness, no masses palpated. No hepatosplenomegaly. Bowel sounds positive.  Musculoskeletal: no clubbing / cyanosis. No joint deformity upper and lower extremities. Good ROM, no contractures. Normal muscle tone.  Skin: no rashes, lesions, ulcers. No induration Neurologic: CN 2-12 grossly intact. Sensation intact, DTR normal. Strength 5/5 in all 4.  Psychiatric: Normal judgment and insight. Alert and oriented x 3. Normal mood.    Labs on Admission: I have personally reviewed following labs and imaging studies  CBC: Recent Labs  Lab 09/14/20 0015  WBC 6.6  NEUTROABS 4.1  HGB 12.5  HCT 37.2  MCV 84.9  PLT 230   Basic Metabolic Panel: Recent Labs  Lab 09/14/20 0015  NA 134*  K 4.0  CL 99  CO2 26  GLUCOSE 316*  BUN 15   CREATININE 1.15*  CALCIUM 7.8*   GFR: Estimated Creatinine Clearance: 43.6 mL/min (A) (by C-G formula based on SCr of 1.15 mg/dL (H)). Liver Function Tests: Recent Labs  Lab 09/14/20 0015  AST 15  ALT 14  ALKPHOS 118  BILITOT 0.3  PROT 8.1  ALBUMIN 3.9   No results for input(s): LIPASE, AMYLASE in the last 168 hours. No results for input(s): AMMONIA in the last 168 hours. Coagulation Profile: No results for input(s): INR, PROTIME in the last 168 hours. Cardiac Enzymes: No results for input(s): CKTOTAL, CKMB, CKMBINDEX, TROPONINI in the last 168 hours. BNP (last 3 results) No results for input(s): PROBNP in the last 8760 hours. HbA1C: No results for input(s): HGBA1C in the last 72 hours. CBG: No results for input(s): GLUCAP in the last 168 hours. Lipid Profile: No results for input(s): CHOL, HDL, LDLCALC, TRIG, CHOLHDL, LDLDIRECT in the last 72 hours. Thyroid Function Tests: No results for input(s): TSH, T4TOTAL, FREET4, T3FREE, THYROIDAB in the last 72 hours. Anemia Panel: No results for input(s): VITAMINB12, FOLATE, FERRITIN, TIBC, IRON, RETICCTPCT in the last 72 hours. Urine analysis:    Component Value Date/Time   COLORURINE YELLOW 09/14/2020 0123   APPEARANCEUR CLEAR 09/14/2020 0123   LABSPEC 1.015 09/14/2020 0123   PHURINE 5.0 09/14/2020 0123   GLUCOSEU >=500 (A) 09/14/2020 0123   HGBUR NEGATIVE 09/14/2020 0123   BILIRUBINUR NEGATIVE 09/14/2020 0123   KETONESUR NEGATIVE 09/14/2020 0123   PROTEINUR NEGATIVE 09/14/2020 0123   UROBILINOGEN 0.2 09/15/2012 1536   NITRITE NEGATIVE 09/14/2020 0123   LEUKOCYTESUR NEGATIVE 09/14/2020 0123    Radiological Exams on Admission: DG Chest 1 View  Result Date: 09/13/2020 CLINICAL DATA:  Fall. EXAM: CHEST  1 VIEW COMPARISON:  Chest radiograph dated 07/25/2020. FINDINGS: No focal consolidation, pleural effusion, pneumothorax. Top-normal cardiac size. Left pectoral pacemaker device. Atherosclerotic calcification of the no  acute osseous pathology. Degenerative changes of the spine. Partially visualized right shoulder arthroplasty. IMPRESSION: No acute cardiopulmonary process. Electronically Signed   By: Elgie Collard M.D.   On: 09/13/2020 23:52   CT HEAD WO CONTRAST ( )  Result Date: 09/13/2020 CLINICAL DATA:  Facial trauma. Frequent falls this year. Weakness today and fell about noon. EXAM: CT HEAD WITHOUT CONTRAST  CT MAXILLOFACIAL WITHOUT CONTRAST CT CERVICAL SPINE WITHOUT CONTRAST TECHNIQUE: Multidetector CT imaging of the head, cervical spine, and maxillofacial structures were performed using the standard protocol without intravenous contrast. Multiplanar CT image reconstructions of the cervical spine and maxillofacial structures were also generated. COMPARISON:  CT head 07/25/2020.  CT cervical spine 07/14/2020 FINDINGS: CT HEAD FINDINGS Brain: No evidence of acute infarction, hemorrhage, hydrocephalus, extra-axial collection or mass lesion/mass effect. Vascular: No hyperdense vessel or unexpected calcification. Skull: Normal. Negative for fracture or focal lesion. Other: Subcutaneous scalp hematoma over the anterior vertex. CT MAXILLOFACIAL FINDINGS Osseous: No fracture or mandibular dislocation. No destructive process. Orbits: Negative. No traumatic or inflammatory finding. Sinuses: Paranasal sinuses and mastoid air cells are clear. Soft tissues: Mild left periorbital soft tissue hematoma. CT CERVICAL SPINE FINDINGS Alignment: Normal. Skull base and vertebrae: No acute fracture. No primary bone lesion or focal pathologic process. Soft tissues and spinal canal: No prevertebral fluid or swelling. No visible canal hematoma. Disc levels: Degenerative changes with disc space narrowing and osteophyte formation at the endplates most prominent at C6-7. Upper chest: Visualized lung apices are clear. Other: None. IMPRESSION: 1. No acute intracranial abnormalities. 2. No acute displaced orbital or facial fractures. 3. Normal  alignment of the cervical spine. Mild degenerative changes. No acute displaced fractures. Electronically Signed   By: Burman Nieves M.D.   On: 09/13/2020 23:53   CT Cervical Spine Wo Contrast  Result Date: 09/13/2020 CLINICAL DATA:  Facial trauma. Frequent falls this year. Weakness today and fell about noon. EXAM: CT HEAD WITHOUT CONTRAST CT MAXILLOFACIAL WITHOUT CONTRAST CT CERVICAL SPINE WITHOUT CONTRAST TECHNIQUE: Multidetector CT imaging of the head, cervical spine, and maxillofacial structures were performed using the standard protocol without intravenous contrast. Multiplanar CT image reconstructions of the cervical spine and maxillofacial structures were also generated. COMPARISON:  CT head 07/25/2020.  CT cervical spine 07/14/2020 FINDINGS: CT HEAD FINDINGS Brain: No evidence of acute infarction, hemorrhage, hydrocephalus, extra-axial collection or mass lesion/mass effect. Vascular: No hyperdense vessel or unexpected calcification. Skull: Normal. Negative for fracture or focal lesion. Other: Subcutaneous scalp hematoma over the anterior vertex. CT MAXILLOFACIAL FINDINGS Osseous: No fracture or mandibular dislocation. No destructive process. Orbits: Negative. No traumatic or inflammatory finding. Sinuses: Paranasal sinuses and mastoid air cells are clear. Soft tissues: Mild left periorbital soft tissue hematoma. CT CERVICAL SPINE FINDINGS Alignment: Normal. Skull base and vertebrae: No acute fracture. No primary bone lesion or focal pathologic process. Soft tissues and spinal canal: No prevertebral fluid or swelling. No visible canal hematoma. Disc levels: Degenerative changes with disc space narrowing and osteophyte formation at the endplates most prominent at C6-7. Upper chest: Visualized lung apices are clear. Other: None. IMPRESSION: 1. No acute intracranial abnormalities. 2. No acute displaced orbital or facial fractures. 3. Normal alignment of the cervical spine. Mild degenerative changes. No  acute displaced fractures. Electronically Signed   By: Burman Nieves M.D.   On: 09/13/2020 23:53   DG Humerus Right  Result Date: 09/13/2020 CLINICAL DATA:  Fall, right arm pain EXAM: RIGHT HUMERUS - 2+ VIEW COMPARISON:  07/25/2020 FINDINGS: Interval right total shoulder arthroplasty. Normal alignment. No fracture or dislocation. Soft tissues are unremarkable. IMPRESSION: Interval right total shoulder arthroplasty. No fracture or dislocation. Electronically Signed   By: Helyn Numbers M.D.   On: 09/13/2020 23:52   CT Maxillofacial Wo Contrast  Result Date: 09/13/2020 CLINICAL DATA:  Facial trauma. Frequent falls this year. Weakness today and fell about noon. EXAM: CT HEAD WITHOUT CONTRAST CT MAXILLOFACIAL  WITHOUT CONTRAST CT CERVICAL SPINE WITHOUT CONTRAST TECHNIQUE: Multidetector CT imaging of the head, cervical spine, and maxillofacial structures were performed using the standard protocol without intravenous contrast. Multiplanar CT image reconstructions of the cervical spine and maxillofacial structures were also generated. COMPARISON:  CT head 07/25/2020.  CT cervical spine 07/14/2020 FINDINGS: CT HEAD FINDINGS Brain: No evidence of acute infarction, hemorrhage, hydrocephalus, extra-axial collection or mass lesion/mass effect. Vascular: No hyperdense vessel or unexpected calcification. Skull: Normal. Negative for fracture or focal lesion. Other: Subcutaneous scalp hematoma over the anterior vertex. CT MAXILLOFACIAL FINDINGS Osseous: No fracture or mandibular dislocation. No destructive process. Orbits: Negative. No traumatic or inflammatory finding. Sinuses: Paranasal sinuses and mastoid air cells are clear. Soft tissues: Mild left periorbital soft tissue hematoma. CT CERVICAL SPINE FINDINGS Alignment: Normal. Skull base and vertebrae: No acute fracture. No primary bone lesion or focal pathologic process. Soft tissues and spinal canal: No prevertebral fluid or swelling. No visible canal hematoma. Disc  levels: Degenerative changes with disc space narrowing and osteophyte formation at the endplates most prominent at C6-7. Upper chest: Visualized lung apices are clear. Other: None. IMPRESSION: 1. No acute intracranial abnormalities. 2. No acute displaced orbital or facial fractures. 3. Normal alignment of the cervical spine. Mild degenerative changes. No acute displaced fractures. Electronically Signed   By: Burman Nieves M.D.   On: 09/13/2020 23:53    EKG: Independently reviewed.  Sinus rhythm with incomplete left bundle branch block.  Assessment/Plan Active Problems:   Diabetes mellitus type 2 in obese [facility])   Multiple falls   Pressure injury of skin   Generalized weakness   Generalized weakness/frequent falls/history of sick sinus syndrome status post pacemaker: As mentioned above, no prodromal symptoms, no loss of consciousness in any of those episodes.  Source unclear however she is on several medications which may cause drowsiness and does include amitriptyline, Dilaudid, hydroxyzine, gabapentin, oxycodone so I wonder if this is likely secondary to polypharmacy however once again, she denies having any dizziness either.  I will hold all those medications for now.  We will consult PT OT.  We will need to order pacemaker interrogation.  Type 2 diabetes mellitus: It appears that she is taking Lantus only as needed.  We will check hemoglobin A1c.  Resume Farxiga and start on SSI.  CKD stage IIIa: At baseline.  Monitor.  Hypothyroidism: Resume Synthroid.  Chronically elevated troponin: Troponin elevated, chronically and at baseline.  No chest pain or shortness of breath.  DVT prophylaxis: heparin injection 5,000 Units Start: 09/14/20 2200 Code Status: DNR Family Communication: None present at bedside.  Plan of care discussed with patient in length and he verbalized understanding and agreed with it. Disposition Plan: Potential discharge in next 1 to 2 days Consults called:  None   Status is: Observation  The patient remains OBS appropriate and will d/c before 2 midnights.  Dispo: The patient is from: Home              Anticipated d/c is to: Home              Patient currently is not medically stable to d/c.   Difficult to place patient No       Hughie Closs MD [facility]  09/14/2020, 5:54 PM  To contact the attending provider between 7A-7P or the covering provider during after hours 7P-7A, please log into the web site [email or web address]

## 2020-09-14 NOTE — ED Provider Notes (Signed)
[facility] EMERGENCY DEPARTMENT Provider Note   CSN: [id number] Arrival date & time: 09/13/20  2201     History Chief Complaint  Patient presents with   Marletta Lor    Jillian Leblanc is a 75 y.o. female.  The history is provided by the patient and a relative.  Fall This is a recurrent problem. The current episode started 6 to 12 hours ago. The problem occurs rarely. The problem has been resolved. Pertinent negatives include no chest pain, no abdominal pain, no headaches and no shortness of breath. Nothing aggravates the symptoms. Nothing relieves the symptoms. She has tried nothing for the symptoms. The treatment provided no relief.  Patient with a h/o frequent falls presents with a fall at approximately noon at home and felt shaky and came to the ED for the fall and reportedly fell in the parking lot and abraded the bridge of her nose.  No CP, no f/c/r.  No focal weakness.  Feels globally weak with SOB.   No changes in vision or speech.  Daughter reports she has gotten new medications in the mail and she has not checked them.  She reports that she just completed PT for frequent falls.  Tetanus is up to date.      Past Medical History:  Diagnosis Date   Diabetes mellitus without complication (HCC)    Interstitial cystitis    Thyroid disease     Patient Active Problem List   Diagnosis Date Noted   Symptomatic anemia 07/26/2020   Acute kidney injury superimposed on chronic kidney disease (HCC) 07/26/2020   Hyponatremia 07/26/2020   Hypokalemia 07/26/2020   Dislocated shoulder, right, initial encounter 07/26/2020   Closed displaced fracture of coracoid process of right shoulder 07/26/2020   Diabetes mellitus type 2 in obese (HCC) 07/26/2020   Elevated troponin 07/26/2020   Recurrent falls 08/16/2019   Sinus arrest 08/15/2019   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   Cervicalgia of occipito-atlanto-axial region 06/26/2019   Head injury 06/26/2019   Frequent falls 06/26/2019     Past Surgical History:  Procedure Laterality Date   ABDOMINAL HYSTERECTOMY     CHOLECYSTECTOMY     NECK SURGERY     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     History reviewed. No pertinent family history.  Social History   Tobacco Use   Smoking status: Never   Smokeless tobacco: Never  Vaping Use   Vaping Use: Never used  Substance Use Topics   Alcohol use: No   Drug use: Never    Home Medications Prior to Admission medications   Medication Sig Start Date End Date Taking? Authorizing Provider  lidocaine (LIDODERM) 5 % Place 1 patch onto the skin daily. Remove & Discard patch within 12 hours or as directed by MD 09/14/20  Yes Somaya Grassi, MD  amitriptyline (ELAVIL) 100 MG tablet Take 100 mg by mouth at bedtime. 08/01/19   [provider]  ASPIRIN LOW DOSE 81 MG EC tablet Take 81 mg by mouth daily. 06/16/19   [provider]  FARXIGA 10 MG TABS  tablet Take 10 mg by mouth daily. 06/29/20   [provider]  ferrous sulfate 325 (65 FE) MG tablet Take 1 tablet (325 mg total) by mouth daily with breakfast. 07/29/20   Amin, Loura Halt, MD  furosemide (LASIX) 40 MG tablet Take 40 mg by mouth 2 (two) times daily. 07/19/20   [provider]  HYDROmorphone (DILAUDID) 2 MG tablet Take 1 tablet (2 mg total) by mouth every 4 (four) hours as needed (for post op pain not controlled on current percocet). 07/29/20   Shuford, French Ana, PA-C  hydrOXYzine (ATARAX/VISTARIL) 25 MG tablet Take 1-2 tablets by mouth at bedtime as needed for anxiety. 06/14/19   [provider]  ibuprofen (ADVIL) 600 MG tablet Take 600 mg by mouth every 8 (eight) hours as needed for moderate pain.  06/11/19   [provider]  insulin glargine, 1 Unit Dial, (TOUJEO) 300 UNIT/ML Solostar Pen Inject 15 Units into the skin daily as needed (high Glucose).     [provider]  levothyroxine (SYNTHROID) 125 MCG tablet Take 1 tablet (125 mcg total) by mouth daily. 10/18/19   Barrett, Joline Salt, PA-C  lidocaine (LIDODERM) 5 % Place 1 patch onto the skin daily. Remove & Discard patch within 12 hours or as directed by MD Patient taking differently: Place 1 patch onto the skin daily as needed (pain). Remove & Discard patch within 12 hours or as directed by MD 07/14/20   Henderly, Britni A, PA-C  Naproxen Sod-diphenhydrAMINE (ALEVE PM PO) Take 3 tablets by mouth at bedtime.    [provider]  oxybutynin (DITROPAN-XL) 10 MG 24 hr tablet Take 10 mg by mouth daily. 06/11/19   [provider]  oxyCODONE-acetaminophen (PERCOCET) 10-325 MG per tablet Take 1 tablet by mouth every 8 (eight) hours as needed for pain.     [provider]  pantoprazole (PROTONIX) 40 MG tablet Take 1 tablet (40 mg total) by mouth 2 (two) times daily before a meal. 07/29/20   Amin, Loura Halt, MD  pramipexole (MIRAPEX) 1.5 MG tablet Take 1.5 mg by mouth 3 (three) times daily. 06/29/20   [provider]  senna-docusate (SENOKOT-S) 8.6-50 MG tablet Take 2 tablets by mouth at bedtime as needed for mild constipation or moderate constipation. 07/29/20   Dimple Nanas, MD    Allergies    Pentosan polysulfate sodium, Elmiron [pentosan polysulfate], and Naloxone  Review of Systems   Review of Systems  Constitutional:  Negative for diaphoresis and fever.  HENT:  Negative for drooling.   Eyes:  Negative for redness.  Respiratory:  Negative for shortness of breath.   Cardiovascular:  Negative for chest pain.  Gastrointestinal:  Negative for abdominal pain and nausea.   Musculoskeletal:  Negative for neck pain.  Skin:  Negative for rash.  Neurological:  Negative for facial asymmetry, speech difficulty, weakness, numbness and headaches.  Psychiatric/Behavioral:  Negative for agitation.    Physical Exam Updated Vital Signs BP (!) 129/95   Pulse 78   Temp 97.8 F (36.6 C) (Oral)   Resp 19   Wt 90.3 kg   SpO2 98%   BMI 38.86 kg/m   Physical Exam Vitals and nursing note reviewed.  Constitutional:      General: She is not in acute distress.    Appearance: Normal appearance. She is not diaphoretic.  HENT:     Head: Normocephalic. No raccoon eyes.      Right Ear: Tympanic membrane normal. No hemotympanum.  Left Ear: Tympanic membrane normal. No hemotympanum.     Nose: No rhinorrhea.  Eyes:     Extraocular Movements: Extraocular movements intact.     Conjunctiva/sclera: Conjunctivae normal.     Pupils: Pupils are equal, round, and reactive to light.  Cardiovascular:     Rate and Rhythm: Normal rate and regular rhythm.     Pulses: Normal pulses.     Heart sounds: Normal heart sounds.  Pulmonary:     Effort: Pulmonary effort is normal. No respiratory distress.     Breath sounds: Normal breath sounds. No stridor. No wheezing, rhonchi or rales.  Chest:     Chest wall: No tenderness.  Abdominal:     General: Abdomen is flat. Bowel sounds are normal.     Palpations: Abdomen is soft.     Tenderness: There is no abdominal tenderness. There is no guarding or rebound.  Musculoskeletal:        General: Normal range of motion.     Right shoulder: Normal.     Left shoulder: Normal.     Right upper arm: Normal.     Right elbow: Normal.     Right wrist: Normal. No bony tenderness, snuff box tenderness or crepitus.     Left wrist: Normal. No bony tenderness, snuff box tenderness or crepitus.     Cervical back: Normal, normal range of motion and neck supple. No tenderness.     Thoracic back: Normal.     Lumbar back: Normal.     Right hip: Normal.      Left hip: Normal.     Right knee: No LCL laxity, MCL laxity, ACL laxity or PCL laxity. Normal meniscus and normal patellar mobility.     Instability Tests: Anterior drawer test negative. Posterior drawer test negative.     Left knee: No LCL laxity, MCL laxity, ACL laxity or PCL laxity.Normal meniscus and normal patellar mobility.     Instability Tests: Anterior drawer test negative. Posterior drawer test negative.     Right ankle: Normal.     Right Achilles Tendon: Normal.     Left ankle: Normal.     Left Achilles Tendon: Normal.     Right foot: Normal capillary refill. No deformity, bony tenderness or crepitus.     Left foot: Normal capillary refill. No deformity, bony tenderness or crepitus.  Lymphadenopathy:     Cervical: No cervical adenopathy.  Skin:    General: Skin is warm and dry.     Capillary Refill: Capillary refill takes less than 2 seconds.  Neurological:     General: No focal deficit present.     Mental Status: She is alert.     Deep Tendon Reflexes: Reflexes normal.  Psychiatric:        Mood and Affect: Mood normal.    ED Results / Procedures / Treatments   Labs (all labs ordered are listed, but only abnormal results are displayed) Labs Reviewed  CBC WITH DIFFERENTIAL/PLATELET - Abnormal; Notable for the following components:      Result Value   RDW 25.5 (*)    All other components within normal limits  COMPREHENSIVE METABOLIC PANEL  URINALYSIS, ROUTINE W REFLEX MICROSCOPIC  TROPONIN I (HIGH SENSITIVITY)  TROPONIN I (HIGH SENSITIVITY)    EKG EKG Interpretation  Date/Time:  Monday September 14 2020 00:22:43 EDT Ventricular Rate:  88 PR Interval:  171 QRS Duration: 112 QT Interval:  403 QTC Calculation: 488 R Axis:   -45 Text Interpretation: Sinus rhythm Incomplete left  bundle branch block Baseline wander in lead(s) V2 Confirmed by Nicanor AlconPalumbo, Deann Mclaine ([id number]) on 09/14/2020 12:43:58 AM  Radiology DG Chest 1 View  Result Date: 09/13/2020 CLINICAL DATA:   Fall. EXAM: CHEST  1 VIEW COMPARISON:  Chest radiograph dated 07/25/2020. FINDINGS: No focal consolidation, pleural effusion, pneumothorax. Top-normal cardiac size. Left pectoral pacemaker device. Atherosclerotic calcification of the no acute osseous pathology. Degenerative changes of the spine. Partially visualized right shoulder arthroplasty. IMPRESSION: No acute cardiopulmonary process. Electronically Signed   By: Elgie CollardArash  Radparvar M.D.   On: 09/13/2020 23:52   CT HEAD WO CONTRAST (5MM)  Result Date: 09/13/2020 CLINICAL DATA:  Facial trauma. Frequent falls this year. Weakness today and fell about noon. EXAM: CT HEAD WITHOUT CONTRAST CT MAXILLOFACIAL WITHOUT CONTRAST CT CERVICAL SPINE WITHOUT CONTRAST TECHNIQUE: Multidetector CT imaging of the head, cervical spine, and maxillofacial structures were performed using the standard protocol without intravenous contrast. Multiplanar CT image reconstructions of the cervical spine and maxillofacial structures were also generated. COMPARISON:  CT head 07/25/2020.  CT cervical spine 07/14/2020 FINDINGS: CT HEAD FINDINGS Brain: No evidence of acute infarction, hemorrhage, hydrocephalus, extra-axial collection or mass lesion/mass effect. Vascular: No hyperdense vessel or unexpected calcification. Skull: Normal. Negative for fracture or focal lesion. Other: Subcutaneous scalp hematoma over the anterior vertex. CT MAXILLOFACIAL FINDINGS Osseous: No fracture or mandibular dislocation. No destructive process. Orbits: Negative. No traumatic or inflammatory finding. Sinuses: Paranasal sinuses and mastoid air cells are clear. Soft tissues: Mild left periorbital soft tissue hematoma. CT CERVICAL SPINE FINDINGS Alignment: Normal. Skull base and vertebrae: No acute fracture. No primary bone lesion or focal pathologic process. Soft tissues and spinal canal: No prevertebral fluid or swelling. No visible canal hematoma. Disc levels: Degenerative changes with disc space narrowing and  osteophyte formation at the endplates most prominent at C6-7. Upper chest: Visualized lung apices are clear. Other: None. IMPRESSION: 1. No acute intracranial abnormalities. 2. No acute displaced orbital or facial fractures. 3. Normal alignment of the cervical spine. Mild degenerative changes. No acute displaced fractures. Electronically Signed   By: Burman NievesWilliam  Stevens M.D.   On: 09/13/2020 23:53   CT Cervical Spine Wo Contrast  Result Date: 09/13/2020 CLINICAL DATA:  Facial trauma. Frequent falls this year. Weakness today and fell about noon. EXAM: CT HEAD WITHOUT CONTRAST CT MAXILLOFACIAL WITHOUT CONTRAST CT CERVICAL SPINE WITHOUT CONTRAST TECHNIQUE: Multidetector CT imaging of the head, cervical spine, and maxillofacial structures were performed using the standard protocol without intravenous contrast. Multiplanar CT image reconstructions of the cervical spine and maxillofacial structures were also generated. COMPARISON:  CT head 07/25/2020.  CT cervical spine 07/14/2020 FINDINGS: CT HEAD FINDINGS Brain: No evidence of acute infarction, hemorrhage, hydrocephalus, extra-axial collection or mass lesion/mass effect. Vascular: No hyperdense vessel or unexpected calcification. Skull: Normal. Negative for fracture or focal lesion. Other: Subcutaneous scalp hematoma over the anterior vertex. CT MAXILLOFACIAL FINDINGS Osseous: No fracture or mandibular dislocation. No destructive process. Orbits: Negative. No traumatic or inflammatory finding. Sinuses: Paranasal sinuses and mastoid air cells are clear. Soft tissues: Mild left periorbital soft tissue hematoma. CT CERVICAL SPINE FINDINGS Alignment: Normal. Skull base and vertebrae: No acute fracture. No primary bone lesion or focal pathologic process. Soft tissues and spinal canal: No prevertebral fluid or swelling. No visible canal hematoma. Disc levels: Degenerative changes with disc space narrowing and osteophyte formation at the endplates most prominent at C6-7.  Upper chest: Visualized lung apices are clear. Other: None. IMPRESSION: 1. No acute intracranial abnormalities. 2. No acute displaced orbital or facial fractures.  3. Normal alignment of the cervical spine. Mild degenerative changes. No acute displaced fractures. Electronically Signed   By: Burman Nieves M.D.   On: 09/13/2020 23:53   DG Humerus Right  Result Date: 09/13/2020 CLINICAL DATA:  Fall, right arm pain EXAM: RIGHT HUMERUS - 2+ VIEW COMPARISON:  07/25/2020 FINDINGS: Interval right total shoulder arthroplasty. Normal alignment. No fracture or dislocation. Soft tissues are unremarkable. IMPRESSION: Interval right total shoulder arthroplasty. No fracture or dislocation. Electronically Signed   By: Helyn Numbers M.D.   On: 09/13/2020 23:52   CT Maxillofacial Wo Contrast  Result Date: 09/13/2020 CLINICAL DATA:  Facial trauma. Frequent falls this year. Weakness today and fell about noon. EXAM: CT HEAD WITHOUT CONTRAST CT MAXILLOFACIAL WITHOUT CONTRAST CT CERVICAL SPINE WITHOUT CONTRAST TECHNIQUE: Multidetector CT imaging of the head, cervical spine, and maxillofacial structures were performed using the standard protocol without intravenous contrast. Multiplanar CT image reconstructions of the cervical spine and maxillofacial structures were also generated. COMPARISON:  CT head 07/25/2020.  CT cervical spine 07/14/2020 FINDINGS: CT HEAD FINDINGS Brain: No evidence of acute infarction, hemorrhage, hydrocephalus, extra-axial collection or mass lesion/mass effect. Vascular: No hyperdense vessel or unexpected calcification. Skull: Normal. Negative for fracture or focal lesion. Other: Subcutaneous scalp hematoma over the anterior vertex. CT MAXILLOFACIAL FINDINGS Osseous: No fracture or mandibular dislocation. No destructive process. Orbits: Negative. No traumatic or inflammatory finding. Sinuses: Paranasal sinuses and mastoid air cells are clear. Soft tissues: Mild left periorbital soft tissue hematoma. CT  CERVICAL SPINE FINDINGS Alignment: Normal. Skull base and vertebrae: No acute fracture. No primary bone lesion or focal pathologic process. Soft tissues and spinal canal: No prevertebral fluid or swelling. No visible canal hematoma. Disc levels: Degenerative changes with disc space narrowing and osteophyte formation at the endplates most prominent at C6-7. Upper chest: Visualized lung apices are clear. Other: None. IMPRESSION: 1. No acute intracranial abnormalities. 2. No acute displaced orbital or facial fractures. 3. Normal alignment of the cervical spine. Mild degenerative changes. No acute displaced fractures. Electronically Signed   By: Burman Nieves M.D.   On: 09/13/2020 23:53    Procedures Procedures   Medications Ordered in ED Medications  lidocaine (LIDODERM) 5 % 3 patch (has no administration in time range)    ED Course  I have reviewed the triage vital signs and the nursing notes.  Pertinent labs & imaging results that were available during my care of the patient were reviewed by me and considered in my medical decision making (see chart for details). Wound care provided in the ED.    Family updated frequently as imaging and labs returned.  Daughter updated that a social work consult was placed for further home PT.   Patient and daughter updated regarding low calcium. Family informed that patient's glucose was elevated above her baseline and this was treated with subcutaneous insulin.    No internal injuries from falls.   I suspect the pain medication is playing a role. Daughter adamantly denies this.    Family informed of elevated troponin and need for admission for cardiac rule out    Legna Mausolf was evaluated in Emergency Department on 09/14/2020 for the symptoms described in the history of present illness. She was evaluated in the context of the global COVID-19 pandemic, which necessitated consideration that the patient might be at risk for infection with the SARS-CoV-2  virus that causes COVID-19. Institutional protocols and algorithms that pertain to the evaluation of patients at risk for COVID-19 are in a state of  rapid change based on information released by regulatory bodies including the CDC and federal and state organizations. These policies and algorithms were followed during the patient's care in the ED.  Final Clinical Impression(s) / ED Diagnoses Admit to medicine     Renetta Suman, MD 09/14/20 0157

## 2020-09-14 NOTE — Progress Notes (Signed)
The patient is c/o pain. Rated 7/10. The PCP was notified. Awaiting new orders.

## 2020-09-14 NOTE — ED Notes (Signed)
Report given to [facility] and [facility] RN.  

## 2020-09-15 DIAGNOSIS — R531 Weakness: Secondary | ICD-10-CM | POA: Diagnosis not present

## 2020-09-15 DIAGNOSIS — S0031XA Abrasion of nose, initial encounter: Secondary | ICD-10-CM | POA: Diagnosis not present

## 2020-09-15 LAB — BASIC METABOLIC PANEL
Anion gap: 6 (ref 5–15)
BUN: 14 mg/dL (ref 8–23)
CO2: 25 mmol/L (ref 22–32)
Calcium: 7.7 mg/dL — ABNORMAL LOW (ref 8.9–10.3)
Chloride: 103 mmol/L (ref 98–111)
Creatinine, Ser: 0.99 mg/dL (ref 0.44–1.00)
GFR, Estimated: >60 mL/min (ref >60)
Glucose, Bld: 130 mg/dL — ABNORMAL HIGH (ref 70–99)
Potassium: 3.8 mmol/L (ref 3.5–5.1)
Sodium: 134 mmol/L — ABNORMAL LOW (ref 135–145)

## 2020-09-15 LAB — CBC
HCT: 34.9 % — ABNORMAL LOW (ref 36.0–46.0)
Hemoglobin: 11.2 g/dL — ABNORMAL LOW (ref 12.0–15.0)
MCH: 28.1 pg (ref 26.0–34.0)
MCHC: 32.1 g/dL (ref 30.0–36.0)
MCV: 87.5 fL (ref 80.0–100.0)
Platelets: 201 10*3/uL (ref 150–400)
RBC: 3.99 MIL/uL (ref 3.87–5.11)
WBC: 4.7 10*3/uL (ref 4.0–10.5)
nRBC: 0 % (ref 0.0–0.2)

## 2020-09-15 LAB — HEMOGLOBIN A1C
Hgb A1c MFr Bld: 7.5 % — ABNORMAL HIGH (ref 4.8–5.6)
Mean Plasma Glucose: 168.55 mg/dL

## 2020-09-15 LAB — GLUCOSE, CAPILLARY: Glucose-Capillary: 149 mg/dL — ABNORMAL HIGH (ref 70–99)

## 2020-09-15 NOTE — Plan of Care (Signed)
  Problem: Nutrition: Goal: Adequate nutrition will be maintained Outcome: Adequate for Discharge   

## 2020-09-15 NOTE — Evaluation (Signed)
Occupational Therapy Evaluation Patient Details Name: Jillian Leblanc MRN: [id number] DOB: 10/01/1945 Today's Date: 09/15/2020    History of Present Illness 75 y.o. female presented to [facility] with complaint of frequent falls. Pt with medical history significant of type 2 diabetes mellitus, CKD stage IIIa, s/p right reverse shoulder on 07/28/20.   Clinical Impression   Pt admitted with the above diagnoses and presents with below problem list. Pt will benefit from continued acute OT to address the below listed deficits and maximize independence with basic ADLs prior to d/c to venue below. At baseline, pt reports she is mod I with basic ADLs, uses walker. Recently pt has been operating at supervision level with OOB ADLs d/t onset of episodes of confusion and weakness in standing position at times. Pt reports having a system with her spouse where she verbally alerts him to bring a chair. Pt with noticeable expressive difficulty this session, frequently struggling with word finding. Pt reports this has been going on "for awhile now." No family present to provide additional report. Extra time to complete tasks d/t decreased cognition (slow processing and some sequencing deficits noted), decreased activity tolerance, and generalized weakness.      Follow Up Recommendations  Home health OT;Supervision/Assistance - 24 hour    Equipment Recommendations  None recommended by OT    Recommendations for Other Services Speech consult;PT consult     Precautions / Restrictions Precautions Precautions: Other (comment) Precaution Comments: Per pt no longer needs to use sling. No ROM restrictions that she is aware of "the [facility]) PT tells me about that." Restrictions Weight Bearing Restrictions: No      Mobility Bed Mobility               General bed mobility comments: in recliner at start and end of session    Transfers Overall transfer level: Needs assistance Equipment used: Rolling walker (2  wheeled) Transfers: Sit to/from Stand Sit to Stand: Min guard         General transfer comment: min guard for safety to/from recliner. Pt utilizes momentum to power up to standing. Extra time and effort but no physical assist.    Balance Overall balance assessment: Needs assistance Sitting-balance support: No upper extremity supported;Feet supported Sitting balance-Leahy Scale: Fair     Standing balance support: Bilateral upper extremity supported;During functional activity Standing balance-Leahy Scale: Poor Standing balance comment: rw at baseline                           ADL either performed or assessed with clinical judgement   ADL Overall ADL's : Needs assistance/impaired Eating/Feeding: Set up;Sitting   Grooming: Sitting;Set up   Upper Body Bathing: Set up;Sitting   Lower Body Bathing: Min guard;Sit to/from stand;Sitting/lateral leans   Upper Body Dressing : Set up;Sitting   Lower Body Dressing: Min guard;Sitting/lateral leans;Sit to/from stand   Toilet Transfer: Min guard;Ambulation;RW   Toileting- Clothing Manipulation and Hygiene: Min guard;Minimal assistance;Sit to/from stand;Sitting/lateral lean   Tub/ Shower Transfer: Min guard;Ambulation;Shower seat;Rolling walker   Functional mobility during ADLs: Min guard;Rolling walker;Cueing for sequencing General ADL Comments: Impaired cognition, decreased activity tolerance, and generalized weakness impacting assist level with ADLs.     Vision Baseline Vision/History: 1 Wears glasses       Perception     Praxis      Pertinent Vitals/Pain Pain Assessment: Faces Faces Pain Scale: No hurt     Hand Dominance Right   Extremity/Trunk Assessment Upper Extremity  Assessment Upper Extremity Assessment: Generalized weakness   Lower Extremity Assessment Lower Extremity Assessment: Defer to PT evaluation       Communication Communication Communication: No difficulties   Cognition  Arousal/Alertness: Awake/alert Behavior During Therapy: Flat affect Overall Cognitive Status: No family/caregiver present to determine baseline cognitive functioning                                 General Comments: Pt endorses h/o word finding difficulties and "stuttering" that has been going on "for awhile now." Slow processing, STM deficits, some sequencing difficulty. Extra time needed to complete ADL tasks.   General Comments       Exercises     Shoulder Instructions      Home Living Family/patient expects to be discharged to:: Private residence Living Arrangements: Spouse/significant other Available Help at Discharge: Family Type of Home: House Home Access: Stairs to enter     Home Layout: Two level;Full bath on main level;Able to live on main level with bedroom/bathroom     Bathroom Shower/Tub: Walk-in shower;Tub only   Bathroom Toilet: Handicapped height Bathroom Accessibility: Yes   Home Equipment: Walker - 2 wheels;Cane - quad;Grab bars - tub/shower;Grab bars - toilet;Shower seat;Hand held shower head          Prior Functioning/Environment Level of Independence: Independent with assistive device(s)        Comments: RW to ambulate        OT Problem List: Decreased strength;Decreased activity tolerance;Impaired balance (sitting and/or standing);Decreased cognition;Decreased knowledge of use of DME or AE;Decreased knowledge of precautions;Pain;Impaired UE functional use      OT Treatment/Interventions: Self-care/ADL training;DME and/or AE instruction;Therapeutic activities;Patient/family education;Balance training    OT Goals(Current goals can be found in the care plan section) Acute Rehab OT Goals Patient Stated Goal: not fall OT Goal Formulation: With patient Time For Goal Achievement: 09/29/20 Potential to Achieve Goals: Good ADL Goals Pt Will Perform Lower Body Dressing: with modified independence;sit to/from stand Pt Will Transfer to  Toilet: with modified independence;ambulating Pt Will Perform Toileting - Clothing Manipulation and hygiene: with modified independence;sit to/from stand;sitting/lateral leans Pt Will Perform Tub/Shower Transfer: with modified independence;ambulating;shower seat;rolling walker  OT Frequency: Min 2X/week   Barriers to D/C:            Co-evaluation              AM-PAC OT "6 Clicks" Daily Activity     Outcome Measure Help from another person eating meals?: None Help from another person taking care of personal grooming?: A Little Help from another person toileting, which includes using toliet, bedpan, or urinal?: A Little Help from another person bathing (including washing, rinsing, drying)?: A Little Help from another person to put on and taking off regular upper body clothing?: None Help from another person to put on and taking off regular lower body clothing?: A Little 6 Click Score: 20   End of Session Equipment Utilized During Treatment: Rolling walker  Activity Tolerance: Patient tolerated treatment well Patient left: in chair;with call bell/phone within reach;Other (comment) (with PT)  OT Visit Diagnosis: Unsteadiness on feet (R26.81);Muscle weakness (generalized) (M62.81);History of falling (Z91.81);Pain;Other symptoms and signs involving cognitive function                Time: 3790-2409 OT Time Calculation (min): 19 min Charges:  OT General Charges $OT Visit: 1 Visit OT Evaluation $OT Eval Moderate Complexity: 1 Mod  Raynald Kemp,  OT Acute Rehabilitation Services Pager: [phone] Office: [phone]   Pilar Grammes 09/15/2020, 10:42 AM

## 2020-09-15 NOTE — TOC Transition Note (Signed)
Transition of Care [facility]) - CM/SW Discharge Note   Patient Details  Name: Angelene Rome MRN: [id number] Date of Birth: 11-27-1945  Transition of Care [facility]) CM/SW Contact:  Darleene Cleaver, LCSW Phone Number: 09/15/2020, 1:25 PM   Clinical Narrative:     [name] currently open to [facility] for home health services.  CSW contacted [facility], they will continue to see patient, CSW informed them new orders for [facility] PT, OT, SLP have been ordered.  Patient will be going home with home health through [place].  CSW signing off please reconsult with any other social work needs, home health agency has been notified of planned discharge.    Final next level of care: Home w Home Health Services Barriers to Discharge: Barriers Resolved   Patient Goals and CMS Choice   CMS Medicare.gov Compare Post Acute Care list provided to:: Patient Choice offered to / list presented to : Patient  Discharge Placement                       Discharge Plan and Services                          HH Arranged: PT, OT, Speech Therapy HH Agency: [facility] Date [facility] Agency Contacted: 09/15/20 Time HH Agency Contacted: [phone] Representative spoke with at [facility] Agency: Cindie  Social Determinants of Health (SDOH) Interventions     Readmission Risk Interventions No flowsheet data found.

## 2020-09-15 NOTE — Evaluation (Signed)
Physical Therapy Evaluation Patient Details Name: Jillian Leblanc MRN: [id number] DOB: May 10, 1945 Today's Date: 09/15/2020   History of Present Illness  75 y.o. female presented to [facility] with complaint of frequent falls. Pt with medical history significant of frequent falls, pacemaker, type 2 diabetes mellitus, CKD stage IIIa, s/p right reverse shoulder on 07/28/20, obesity, anemia  Clinical Impression  On eval, pt was Close Supv level for mobility 2* fall history. She walked ~175 feet with a RW. Followed with recliner but did not need it during walk. Pt stated she gets a little warning before she feels like she may fall and she usually tells someone to get a chair for her. She stated she hasn't really been given a reason why she continues to have recurrent falls. Discussed d/c plan-pt plans to return home where she lives with her husband. She stated she feels comfortable returning home and that her husband can assist as needed. She would like to resume HHPT.     Follow Up Recommendations Home health PT;Supervision/Assistance - 24 hour    Equipment Recommendations  None recommended by PT    Recommendations for Other Services OT consult     Precautions / Restrictions Precautions Precautions: Fall Precaution Comments: Per pt, no longer needs to use sling. No ROM restrictions that she is aware of "the [facility]) PT tells me about that." Restrictions Weight Bearing Restrictions: No      Mobility  Bed Mobility               General bed mobility comments: oob in recliner    Transfers Overall transfer level: Modified independent Equipment used: Rolling walker (2 wheeled) Transfers: Sit to/from Stand Sit to Stand: Modified independent (Device/Increase time)         General transfer comment: No assist provided. No LOB. No cues required.  Ambulation/Gait Ambulation/Gait assistance: Supervision Gait Distance (Feet): 175 Feet Assistive device: Rolling walker (2 wheeled) Gait  Pattern/deviations: Step-through pattern;Decreased stride length     General Gait Details: Pt stated she gets a little warning before feeling like she may fall and she usually tells whomever "Chair...chair." Close supervision for safety but no physical assistance provided. Followed with recliner for safety but did not need it.  Stairs            Wheelchair Mobility    Modified Rankin (Stroke Patients Only)       Balance Overall balance assessment: History of Falls;Mild deficits observed, not formally tested Sitting-balance support: No upper extremity supported;Feet supported Sitting balance-Leahy Scale: Fair     Standing balance support: Bilateral upper extremity supported Standing balance-Leahy Scale: Fair Standing balance comment: rw at baseline                             Pertinent Vitals/Pain Pain Assessment: No/denies pain Faces Pain Scale: No hurt    Home Living Family/patient expects to be discharged to:: Private residence Living Arrangements: Spouse/significant other Available Help at Discharge: Family Type of Home: House Home Access: Stairs to enter     Home Layout: Two level;Full bath on main level;Able to live on main level with bedroom/bathroom Home Equipment: Dan Humphreys - 2 wheels;Cane - quad;Grab bars - tub/shower;Grab bars - toilet;Shower seat;Hand held shower head      Prior Function Level of Independence: Independent with assistive device(s)         Comments: RW to ambulate     Hand Dominance   Dominant Hand: Right  Extremity/Trunk Assessment   Upper Extremity Assessment Upper Extremity Assessment: Defer to OT evaluation    Lower Extremity Assessment Lower Extremity Assessment: Generalized weakness    Cervical / Trunk Assessment Cervical / Trunk Assessment: Normal  Communication   Communication: No difficulties  Cognition Arousal/Alertness: Awake/alert Behavior During Therapy: Flat affect Overall Cognitive Status:  No family/caregiver present to determine baseline cognitive functioning                                 General Comments: Pt endorses h/o word finding difficulties and "stuttering" that has been going on "for awhile now." Slow processing, STM deficits, some sequencing difficulty. Extra time needed to complete ADL tasks.      General Comments      Exercises     Assessment/Plan    PT Assessment Patient needs continued PT services  PT Problem List Decreased mobility;Decreased activity tolerance;Decreased balance       PT Treatment Interventions DME instruction;Gait training    PT Goals (Current goals can be found in the Care Plan section)  Acute Rehab PT Goals Patient Stated Goal: "stop falling" PT Goal Formulation: With patient Time For Goal Achievement: 09/29/20 Potential to Achieve Goals: Good    Frequency Min 3X/week   Barriers to discharge        Co-evaluation               AM-PAC PT "6 Clicks" Mobility  Outcome Measure Help needed turning from your back to your side while in a flat bed without using bedrails?: None Help needed moving from lying on your back to sitting on the side of a flat bed without using bedrails?: None Help needed moving to and from a bed to a chair (including a wheelchair)?: None Help needed standing up from a chair using your arms (e.g., wheelchair or bedside chair)?: None Help needed to walk in hospital room?: A Little Help needed climbing 3-5 steps with a railing? : A Little 6 Click Score: 22    End of Session Equipment Utilized During Treatment: Gait belt Activity Tolerance: Patient tolerated treatment well Patient left: in chair;with call bell/phone within reach;with chair alarm set   PT Visit Diagnosis: History of falling (Z91.81);Repeated falls (R29.6)    Time: 1427-6701 PT Time Calculation (min) (ACUTE ONLY): 16 min   Charges:   PT Evaluation $PT Eval Moderate Complexity: 1 Mod             Faye Ramsay, PT Acute Rehabilitation  Office: [phone] Pager: [phone]

## 2020-09-15 NOTE — Discharge Summary (Signed)
Physician Discharge Summary  Lakeidra Reliford DXI:[id number] DOB: 05/20/1945 DOA: 09/13/2020  PCP: Karle Plumber, MD  Admit date: 09/13/2020 Discharge date: 09/15/2020 30 Day Unplanned Readmission Risk Score    Flowsheet Row ED to Hosp-Admission (Discharged) from 07/25/2020 in [facility]  30 Day Unplanned Readmission Risk Score (%) 16.91 Filed at 07/29/2020 1200       This score is the patient's risk of an unplanned readmission within 30 days of being discharged (0 -100%). The score is based on dignosis, age, lab data, medications, orders, and past utilization.   Low:  0-14.9   Medium: 15-21.9   High: 22-29.9   Extreme: 30 and above          Admitted From: Home Disposition: Home  Recommendations for Outpatient Follow-up:  Follow up with PCP in 1-2 weeks Please obtain BMP/CBC in one week Please follow up with your PCP on the following pending results: Unresulted Labs (From admission, onward)    None         Home Health: Yes Equipment/Devices: None  Discharge Condition: Stable CODE STATUS: Full code Diet recommendation: Cardiac  Subjective: Seen and examined.  She has no complaints.  She wants to go home.  HPI: Jillian Leblanc is a 75 y.o. female with medical history significant of type 2 diabetes mellitus, CKD stage IIIa presented to [facility] with complaint of frequent falls.  According to patient, she has fallen about 5-6 times in the last 2 weeks.  She has been feeling generalized weak.  No loss of consciousness in any of those episodes of falls.  No prodromal symptoms, chest pain or shortness of breath or palpitations.  Her daughter drove her to the emergency department and she once again fell in the parking lot and ended up having abrasion on her nose.  No other complaint.  She just says " I cannot figure out why suddenly my legs just give out and I fall"   Of note, patient did have similar kind of situation last year when she had  about 28 falls in 1 month.  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   ED Course: Upon arrival to ED, she was hemodynamically stable.  Extensive work-up was done which included CT head, CT maxillofacial, CT cervical spine, chest x-ray, humerus x-ray right, all of them were unremarkable.  UA unremarkable, CMP and CBC unremarkable.  Renal function at baseline.  Elevated troponin but that is also chronically elevated for her.  Hospitalist service were consulted to admit the patient for further management.  Brief/Interim Summary: Patient was admitted for further work-up of frequent falls and generalized weakness.  Her pacemaker was interrogated and that is working perfectly fine.  Only 1 episode of nonsustained SVT that happened 3 weeks ago.  Nothing else since then according to the representative.  Labs perfectly normal.  She was assessed by PT OT and they once again recommended discharging home with home health.  She is on multiple medications which can cause drowsiness and falls.  There is no other source of her frequent falls.  I have discussed with the patient's daughter over the phone.  We are discharging her home and patient and family in agreement.  Discharge Diagnoses:  Active Problems:   Diabetes mellitus type 2 in obese [facility])   Multiple falls   Pressure injury of skin   Generalized weakness    Discharge Instructions  Allergies as of 09/15/2020       Reactions   Pentosan Polysulfate Sodium    Other reaction(s): Other (See Comments) ELMIRON Y Drug hair fell out 10/21/2009 12:00:00 AM by Johny Blamer CNA 1   Elmiron [pentosan Polysulfate]    Naloxone Other (See Comments)   Hallucinations Confusion Nightmares        Medication List     STOP taking these medications    cefdinir 300 MG capsule Commonly known as: OMNICEF   HYDROmorphone 2 MG tablet Commonly known as: Dilaudid    pantoprazole 40 MG tablet Commonly known as: PROTONIX   senna-docusate 8.6-50 MG tablet Commonly known as: Senokot-S       TAKE these medications    ALEVE PM PO Take 3 tablets by mouth daily as needed (pain).   amitriptyline 100 MG tablet Commonly known as: ELAVIL Take 100 mg by mouth at bedtime.   Aspirin Low Dose 81 MG EC tablet Generic drug: aspirin Take 81 mg by mouth daily.   calcium-vitamin D 250-100 MG-UNIT tablet Take 1 tablet by mouth 2 (two) times daily.   Farxiga 10 MG Tabs tablet Generic drug: dapagliflozin propanediol Take 10 mg by mouth daily.   ferrous sulfate 325 (65 FE) MG tablet Take 1 tablet (325 mg total) by mouth daily with breakfast.   fluticasone 50 MCG/ACT nasal spray Commonly known as: FLONASE Place 1 spray into both nostrils daily as needed for allergies.   furosemide 40 MG tablet Commonly known as: LASIX Take 40 mg by mouth 2 (two) times daily.   hydrOXYzine 25 MG tablet Commonly known as: ATARAX/VISTARIL Take 1-2 tablets by mouth at bedtime as needed for anxiety.   ibuprofen 600 MG tablet Commonly known as: ADVIL Take 600 mg by mouth every 8 (eight) hours as needed for moderate pain.   insulin glargine (1 Unit Dial) 300 UNIT/ML Solostar Pen Commonly known as: TOUJEO Inject 16 Units into the skin daily.   levothyroxine 125 MCG tablet Commonly known as: SYNTHROID Take 1 tablet (125 mcg total) by mouth daily.   levothyroxine 150 MCG tablet Commonly known as: SYNTHROID Take 150 mcg by mouth daily.   lidocaine 5 % Commonly known as: Lidoderm Place 1 patch onto the skin daily. Remove & Discard patch within 12 hours or as directed by MD What changed:  when to take this reasons to take this   lidocaine 5 % Commonly known as: Lidoderm Place 1 patch onto the skin daily. Remove & Discard patch within 12 hours or as directed by MD What changed: You were already taking a medication with the same name, and this prescription was  added. Make sure you understand how and when to take each.   ondansetron 4 MG disintegrating tablet Commonly known as: ZOFRAN-ODT Take 4 mg by mouth every 8 (eight) hours as needed for nausea or vomiting.   oxyCODONE-acetaminophen 10-325 MG tablet Commonly known as: PERCOCET Take 1 tablet by mouth every 8 (eight) hours as needed for pain.   pramipexole 1.5 MG tablet Commonly known as: MIRAPEX Take 1.5 mg by mouth 3 (three) times daily.        Follow-up Information     Karle Plumber, MD. Schedule an appointment as soon as possible for a visit .   Specialty: Internal Medicine Why: recheck calcium in one week Contact information: [address] [place] [address] [phone]         Karle Plumber, MD Follow up in 1 week(s).  Specialty: Internal Medicine Contact information: [address] [place] [address] [phone]         Regan Lemming, MD .   Specialty: Cardiology Contact information: [address] [place] [address] [phone]                Allergies  Allergen Reactions   Pentosan Polysulfate Sodium     Other reaction(s): Other (See Comments) ELMIRON Y Drug hair fell out 10/21/2009 12:00:00 AM by Johny Blamer CNA 1   Elmiron [Pentosan Polysulfate]    Naloxone Other (See Comments)    Hallucinations Confusion Nightmares    Consultations: None   Procedures/Studies: DG Chest 1 View  Result Date: 09/13/2020 CLINICAL DATA:  Fall. EXAM: CHEST  1 VIEW COMPARISON:  Chest radiograph dated 07/25/2020. FINDINGS: No focal consolidation, pleural effusion, pneumothorax. Top-normal cardiac size. Left pectoral pacemaker device. Atherosclerotic calcification of the no acute osseous pathology. Degenerative changes of the spine. Partially visualized right shoulder arthroplasty. IMPRESSION: No acute cardiopulmonary process. Electronically Signed   By: Elgie Collard M.D.   On: 09/13/2020 23:52   CT HEAD WO CONTRAST  ( )  Result Date: 09/13/2020 CLINICAL DATA:  Facial trauma. Frequent falls this year. Weakness today and fell about noon. EXAM: CT HEAD WITHOUT CONTRAST CT MAXILLOFACIAL WITHOUT CONTRAST CT CERVICAL SPINE WITHOUT CONTRAST TECHNIQUE: Multidetector CT imaging of the head, cervical spine, and maxillofacial structures were performed using the standard protocol without intravenous contrast. Multiplanar CT image reconstructions of the cervical spine and maxillofacial structures were also generated. COMPARISON:  CT head 07/25/2020.  CT cervical spine 07/14/2020 FINDINGS: CT HEAD FINDINGS Brain: No evidence of acute infarction, hemorrhage, hydrocephalus, extra-axial collection or mass lesion/mass effect. Vascular: No hyperdense vessel or unexpected calcification. Skull: Normal. Negative for fracture or focal lesion. Other: Subcutaneous scalp hematoma over the anterior vertex. CT MAXILLOFACIAL FINDINGS Osseous: No fracture or mandibular dislocation. No destructive process. Orbits: Negative. No traumatic or inflammatory finding. Sinuses: Paranasal sinuses and mastoid air cells are clear. Soft tissues: Mild left periorbital soft tissue hematoma. CT CERVICAL SPINE FINDINGS Alignment: Normal. Skull base and vertebrae: No acute fracture. No primary bone lesion or focal pathologic process. Soft tissues and spinal canal: No prevertebral fluid or swelling. No visible canal hematoma. Disc levels: Degenerative changes with disc space narrowing and osteophyte formation at the endplates most prominent at C6-7. Upper chest: Visualized lung apices are clear. Other: None. IMPRESSION: 1. No acute intracranial abnormalities. 2. No acute displaced orbital or facial fractures. 3. Normal alignment of the cervical spine. Mild degenerative changes. No acute displaced fractures. Electronically Signed   By: Burman Nieves M.D.   On: 09/13/2020 23:53   CT Cervical Spine Wo Contrast  Result Date: 09/13/2020 CLINICAL DATA:  Facial trauma.  Frequent falls this year. Weakness today and fell about noon. EXAM: CT HEAD WITHOUT CONTRAST CT MAXILLOFACIAL WITHOUT CONTRAST CT CERVICAL SPINE WITHOUT CONTRAST TECHNIQUE: Multidetector CT imaging of the head, cervical spine, and maxillofacial structures were performed using the standard protocol without intravenous contrast. Multiplanar CT image reconstructions of the cervical spine and maxillofacial structures were also generated. COMPARISON:  CT head 07/25/2020.  CT cervical spine 07/14/2020 FINDINGS: CT HEAD FINDINGS Brain: No evidence of acute infarction, hemorrhage, hydrocephalus, extra-axial collection or mass lesion/mass effect. Vascular: No hyperdense vessel or unexpected calcification. Skull: Normal. Negative for fracture or focal lesion. Other: Subcutaneous scalp hematoma over the anterior vertex. CT MAXILLOFACIAL FINDINGS Osseous: No fracture or mandibular dislocation. No destructive process. Orbits: Negative. No traumatic or  inflammatory finding. Sinuses: Paranasal sinuses and mastoid air cells are clear. Soft tissues: Mild left periorbital soft tissue hematoma. CT CERVICAL SPINE FINDINGS Alignment: Normal. Skull base and vertebrae: No acute fracture. No primary bone lesion or focal pathologic process. Soft tissues and spinal canal: No prevertebral fluid or swelling. No visible canal hematoma. Disc levels: Degenerative changes with disc space narrowing and osteophyte formation at the endplates most prominent at C6-7. Upper chest: Visualized lung apices are clear. Other: None. IMPRESSION: 1. No acute intracranial abnormalities. 2. No acute displaced orbital or facial fractures. 3. Normal alignment of the cervical spine. Mild degenerative changes. No acute displaced fractures. Electronically Signed   By: Burman Nieves M.D.   On: 09/13/2020 23:53   DG Humerus Right  Result Date: 09/13/2020 CLINICAL DATA:  Fall, right arm pain EXAM: RIGHT HUMERUS - 2+ VIEW COMPARISON:  07/25/2020 FINDINGS: Interval  right total shoulder arthroplasty. Normal alignment. No fracture or dislocation. Soft tissues are unremarkable. IMPRESSION: Interval right total shoulder arthroplasty. No fracture or dislocation. Electronically Signed   By: Helyn Numbers M.D.   On: 09/13/2020 23:52   CUP PACEART REMOTE DEVICE CHECK  Result Date: 08/19/2020 Scheduled remote reviewed. Normal device function.  7 events labeled NSVT, available EGM showing regular tachy rhythm with 1:1 AV conduction. Next remote 91 days. LR  CT Maxillofacial Wo Contrast  Result Date: 09/13/2020 CLINICAL DATA:  Facial trauma. Frequent falls this year. Weakness today and fell about noon. EXAM: CT HEAD WITHOUT CONTRAST CT MAXILLOFACIAL WITHOUT CONTRAST CT CERVICAL SPINE WITHOUT CONTRAST TECHNIQUE: Multidetector CT imaging of the head, cervical spine, and maxillofacial structures were performed using the standard protocol without intravenous contrast. Multiplanar CT image reconstructions of the cervical spine and maxillofacial structures were also generated. COMPARISON:  CT head 07/25/2020.  CT cervical spine 07/14/2020 FINDINGS: CT HEAD FINDINGS Brain: No evidence of acute infarction, hemorrhage, hydrocephalus, extra-axial collection or mass lesion/mass effect. Vascular: No hyperdense vessel or unexpected calcification. Skull: Normal. Negative for fracture or focal lesion. Other: Subcutaneous scalp hematoma over the anterior vertex. CT MAXILLOFACIAL FINDINGS Osseous: No fracture or mandibular dislocation. No destructive process. Orbits: Negative. No traumatic or inflammatory finding. Sinuses: Paranasal sinuses and mastoid air cells are clear. Soft tissues: Mild left periorbital soft tissue hematoma. CT CERVICAL SPINE FINDINGS Alignment: Normal. Skull base and vertebrae: No acute fracture. No primary bone lesion or focal pathologic process. Soft tissues and spinal canal: No prevertebral fluid or swelling. No visible canal hematoma. Disc levels: Degenerative changes  with disc space narrowing and osteophyte formation at the endplates most prominent at C6-7. Upper chest: Visualized lung apices are clear. Other: None. IMPRESSION: 1. No acute intracranial abnormalities. 2. No acute displaced orbital or facial fractures. 3. Normal alignment of the cervical spine. Mild degenerative changes. No acute displaced fractures. Electronically Signed   By: Burman Nieves M.D.   On: 09/13/2020 23:53     Discharge Exam: Vitals:   09/15/20 0138 09/15/20 0450  BP: 128/86 (!) 137/51  Pulse: 82 73  Resp: 20 18  Temp: 99.5 F (37.5 C) 98.7 F (37.1 C)  SpO2: 95% 91%   Vitals:   09/14/20 2210 09/15/20 0138 09/15/20 0341 09/15/20 0450  BP: (!) 148/57 128/86  (!) 137/51  Pulse: 75 82  73  Resp: 16 20  18   Temp: 98.6 F (37 C) 99.5 F (37.5 C)  98.7 F (37.1 C)  TempSrc: Oral Oral  Oral  SpO2: 94% 95%  91%  Weight:   96.5 kg  General: Pt is alert, awake, not in acute distress Cardiovascular: RRR, S1/S2 +, no rubs, no gallops Respiratory: CTA bilaterally, no wheezing, no rhonchi Abdominal: Soft, NT, ND, bowel sounds + Extremities: no edema, no cyanosis    The results of significant diagnostics from this hospitalization (including imaging, microbiology, ancillary and laboratory) are listed below for reference.     Microbiology: Recent Results (from the past 240 hour(s))  Resp Panel by RT-PCR (Flu A&B, Covid) Nasopharyngeal Swab     Status: None   Collection Time: 09/14/20  3:22 AM   Specimen: Nasopharyngeal Swab; Nasopharyngeal(NP) swabs in vial transport medium  Result Value Ref Range Status   SARS Coronavirus 2 by RT PCR NEGATIVE NEGATIVE Final    Comment: (NOTE) SARS-CoV-2 target nucleic acids are NOT DETECTED.  The SARS-CoV-2 RNA is generally detectable in upper respiratory specimens during the acute phase of infection. The lowest concentration of SARS-CoV-2 viral copies this assay can detect is 138 copies/mL. A negative result does not preclude  SARS-Cov-2 infection and should not be used as the sole basis for treatment or other patient management decisions. A negative result may occur with  improper specimen collection/handling, submission of specimen other than nasopharyngeal swab, presence of viral mutation(s) within the areas targeted by this assay, and inadequate number of viral copies(<138 copies/mL). A negative result must be combined with clinical observations, patient history, and epidemiological information. The expected result is Negative.  Fact Sheet for Patients:  BloggerCourse.com  Fact Sheet for Healthcare Providers:  SeriousBroker.it  This test is no t yet approved or cleared by the [place] FDA and  has been authorized for detection and/or diagnosis of SARS-CoV-2 by FDA under an Emergency Use Authorization (EUA). This EUA will remain  in effect (meaning this test can be used) for the duration of the COVID-19 declaration under Section 564(b)(1) of the Act, 21 U.S.C.section 360bbb-3(b)(1), unless the authorization is terminated  or revoked sooner.       Influenza A by PCR NEGATIVE NEGATIVE Final   Influenza B by PCR NEGATIVE NEGATIVE Final    Comment: (NOTE) The Xpert Xpress SARS-CoV-2/FLU/RSV plus assay is intended as an aid in the diagnosis of influenza from Nasopharyngeal swab specimens and should not be used as a sole basis for treatment. Nasal washings and aspirates are unacceptable for Xpert Xpress SARS-CoV-2/FLU/RSV testing.  Fact Sheet for Patients: BloggerCourse.com  Fact Sheet for Healthcare Providers: SeriousBroker.it  This test is not yet approved or cleared by the [place] FDA and has been authorized for detection and/or diagnosis of SARS-CoV-2 by FDA under an Emergency Use Authorization (EUA). This EUA will remain in effect (meaning this test can be used) for the duration of  the COVID-19 declaration under Section 564(b)(1) of the Act, 21 U.S.C. section 360bbb-3(b)(1), unless the authorization is terminated or revoked.  Performed at [facility], [address] [place], [place] [address]      Labs: BNP (last 3 results) Recent Labs    01/10/20 1604  BNP 68.8   Basic Metabolic Panel: Recent Labs  Lab 09/14/20 0015 09/14/20 2036 09/15/20 0427  NA 134*  --  134*  K 4.0  --  3.8  CL 99  --  103  CO2 26  --  25  GLUCOSE 316*  --  130*  BUN 15  --  14  CREATININE 1.15*  --  0.99  CALCIUM 7.8*  --  7.7*  MG  --  2.2  --    Liver  Function Tests: Recent Labs  Lab 09/14/20 0015  AST 15  ALT 14  ALKPHOS 118  BILITOT 0.3  PROT 8.1  ALBUMIN 3.9   No results for input(s): LIPASE, AMYLASE in the last 168 hours. No results for input(s): AMMONIA in the last 168 hours. CBC: Recent Labs  Lab 09/14/20 0015 09/15/20 0427  WBC 6.6 4.7  NEUTROABS 4.1  --   HGB 12.5 11.2*  HCT 37.2 34.9*  MCV 84.9 87.5  PLT 230 201   Cardiac Enzymes: No results for input(s): CKTOTAL, CKMB, CKMBINDEX, TROPONINI in the last 168 hours. BNP: Invalid input(s): POCBNP CBG: Recent Labs  Lab 09/14/20 2209  GLUCAP 189*   D-Dimer No results for input(s): DDIMER in the last 72 hours. Hgb A1c Recent Labs    09/14/20 2036  HGBA1C 7.5*   Lipid Profile No results for input(s): CHOL, HDL, LDLCALC, TRIG, CHOLHDL, LDLDIRECT in the last 72 hours. Thyroid function studies Recent Labs    09/14/20 2036  TSH 9.210*   Anemia work up No results for input(s): VITAMINB12, FOLATE, FERRITIN, TIBC, IRON, RETICCTPCT in the last 72 hours. Urinalysis    Component Value Date/Time   COLORURINE YELLOW 09/14/2020 0123   APPEARANCEUR CLEAR 09/14/2020 0123   LABSPEC 1.015 09/14/2020 0123   PHURINE 5.0 09/14/2020 0123   GLUCOSEU >=500 (A) 09/14/2020 0123   HGBUR NEGATIVE 09/14/2020 0123   BILIRUBINUR NEGATIVE 09/14/2020 0123   KETONESUR NEGATIVE 09/14/2020 0123    PROTEINUR NEGATIVE 09/14/2020 0123   UROBILINOGEN 0.2 09/15/2012 1536   NITRITE NEGATIVE 09/14/2020 0123   LEUKOCYTESUR NEGATIVE 09/14/2020 0123   Sepsis Labs Invalid input(s): PROCALCITONIN,  WBC,  LACTICIDVEN Microbiology Recent Results (from the past 240 hour(s))  Resp Panel by RT-PCR (Flu A&B, Covid) Nasopharyngeal Swab     Status: None   Collection Time: 09/14/20  3:22 AM   Specimen: Nasopharyngeal Swab; Nasopharyngeal(NP) swabs in vial transport medium  Result Value Ref Range Status   SARS Coronavirus 2 by RT PCR NEGATIVE NEGATIVE Final    Comment: (NOTE) SARS-CoV-2 target nucleic acids are NOT DETECTED.  The SARS-CoV-2 RNA is generally detectable in upper respiratory specimens during the acute phase of infection. The lowest concentration of SARS-CoV-2 viral copies this assay can detect is 138 copies/mL. A negative result does not preclude SARS-Cov-2 infection and should not be used as the sole basis for treatment or other patient management decisions. A negative result may occur with  improper specimen collection/handling, submission of specimen other than nasopharyngeal swab, presence of viral mutation(s) within the areas targeted by this assay, and inadequate number of viral copies(<138 copies/mL). A negative result must be combined with clinical observations, patient history, and epidemiological information. The expected result is Negative.  Fact Sheet for Patients:  BloggerCourse.com  Fact Sheet for Healthcare Providers:  SeriousBroker.it  This test is no t yet approved or cleared by the [place] FDA and  has been authorized for detection and/or diagnosis of SARS-CoV-2 by FDA under an Emergency Use Authorization (EUA). This EUA will remain  in effect (meaning this test can be used) for the duration of the COVID-19 declaration under Section 564(b)(1) of the Act, 21 U.S.C.section 360bbb-3(b)(1), unless the  authorization is terminated  or revoked sooner.       Influenza A by PCR NEGATIVE NEGATIVE Final   Influenza B by PCR NEGATIVE NEGATIVE Final    Comment: (NOTE) The Xpert Xpress SARS-CoV-2/FLU/RSV plus assay is intended as an aid in the diagnosis of influenza from Nasopharyngeal swab specimens  and should not be used as a sole basis for treatment. Nasal washings and aspirates are unacceptable for Xpert Xpress SARS-CoV-2/FLU/RSV testing.  Fact Sheet for Patients: BloggerCourse.comhttps://www.fda.gov/media/152166/download  Fact Sheet for Healthcare Providers: SeriousBroker.ithttps://www.fda.gov/media/152162/download  This test is not yet approved or cleared by the [place] FDA and has been authorized for detection and/or diagnosis of SARS-CoV-2 by FDA under an Emergency Use Authorization (EUA). This EUA will remain in effect (meaning this test can be used) for the duration of the COVID-19 declaration under Section 564(b)(1) of the Act, 21 U.S.C. section 360bbb-3(b)(1), unless the authorization is terminated or revoked.  Performed at [facility], [address] [place], [place] [id number]      Time coordinating discharge: Over 30 minutes  SIGNED:   Hughie Clossavi Tyann Niehaus, MD  [facility] 09/15/2020, 11:10 AM  If 7PM-7AM, please contact night-coverage [email or web address]

## 2020-09-16 LAB — GLUCOSE, CAPILLARY: Glucose-Capillary: 121 mg/dL — ABNORMAL HIGH (ref 70–99)

## 2020-09-17 ENCOUNTER — Encounter: Payer: Medicare Other | Admitting: Cardiology

## 2020-09-17 ENCOUNTER — Telehealth: Payer: Self-pay | Admitting: Cardiology

## 2020-09-17 NOTE — Telephone Encounter (Signed)
Explained to patient the necessity of her wound and device check today. She was wondering bc her device was interrogated in the ED the other day. I explained PPM parameters were probably not changed or checked in the way her doctor would typically want them to be. She agrees and will come in for the check.

## 2020-09-17 NOTE — Telephone Encounter (Signed)
Pt was recently released from the hospital on 09/16/20, pt said her pacemaker was checked while she was in the hospital. Pt wants to know if today's appt necessary? The appt is today at 3:45pm

## 2020-09-17 NOTE — Progress Notes (Deleted)
Electrophysiology Office Note   Date:  09/17/2020   ID:  Jillian Leblanc, DOB 12/14/1945, MRN [id number]  PCP:  Karle Plumber, MD  Cardiologist:  Branch Primary Electrophysiologist:  Talana Slatten Jorja Loa, MD    Chief Complaint: sinus pauses   History of Present Illness: Jillian Leblanc is a 75 y.o. female who is being seen today for the evaluation of sinus pauses, pacemaker at the request of Jillian Leblanc, Jillian Salm, MD. Presenting today for electrophysiology evaluation.  She has a history significant for diabetes and thyroid disease.  She was hospitalized July 2021 after having frequent falls.  She was found to have sinus pauses.  She is now status post Medtronic dual-chamber pacemaker implanted 08/17/2019.  Today, denies symptoms of palpitations, chest pain, shortness of breath, orthopnea, PND, lower extremity edema, claudication, dizziness, presyncope, syncope, bleeding, or neurologic sequela. The patient is tolerating medications without difficulties. ***    Past Medical History:  Diagnosis Date   Diabetes mellitus without complication (HCC)    Interstitial cystitis    Thyroid disease    Past Surgical History:  Procedure Laterality Date   ABDOMINAL HYSTERECTOMY     CHOLECYSTECTOMY     NECK SURGERY     PACEMAKER IMPLANT N/A 08/16/2019   Procedure: PACEMAKER IMPLANT;  Surgeon: Regan Lemming, MD;  Location: [facility];  Service: Cardiovascular;  Laterality: N/A;   REVERSE SHOULDER ARTHROPLASTY Right 07/28/2020   Procedure: REVERSE SHOULDER ARTHROPLASTY;  Surgeon: Francena Hanly, MD;  Location: [facility];  Service: Orthopedics;  Laterality: Right;   SHOULDER SURGERY     THYROIDECTOMY       Current Outpatient Medications  Medication Sig Dispense Refill   amitriptyline (ELAVIL) 100 MG tablet Take 100 mg by mouth at bedtime.     ASPIRIN LOW DOSE 81 MG EC tablet Take 81 mg by mouth daily.     calcium-vitamin D 250-100 MG-UNIT tablet Take 1 tablet by mouth 2 (two) times  daily. 30 tablet 0   FARXIGA 10 MG TABS tablet Take 10 mg by mouth daily.     ferrous sulfate 325 (65 FE) MG tablet Take 1 tablet (325 mg total) by mouth daily with breakfast. 30 tablet 3   fluticasone (FLONASE) 50 MCG/ACT nasal spray Place 1 spray into both nostrils daily as needed for allergies.     furosemide (LASIX) 40 MG tablet Take 40 mg by mouth 2 (two) times daily.     hydrOXYzine (ATARAX/VISTARIL) 25 MG tablet Take 1-2 tablets by mouth at bedtime as needed for anxiety.     ibuprofen (ADVIL) 600 MG tablet Take 600 mg by mouth every 8 (eight) hours as needed for moderate pain.      insulin glargine, 1 Unit Dial, (TOUJEO) 300 UNIT/ML Solostar Pen Inject 16 Units into the skin daily.     levothyroxine (SYNTHROID) 125 MCG tablet Take 1 tablet (125 mcg total) by mouth daily. (Patient not taking: No sig reported) 30 tablet 1   levothyroxine (SYNTHROID) 150 MCG tablet Take 150 mcg by mouth daily.     lidocaine (LIDODERM) 5 % Place 1 patch onto the skin daily. Remove & Discard patch within 12 hours or as directed by MD (Patient taking differently: Place 1 patch onto the skin daily as needed (pain). Remove & Discard patch within 12 hours or as directed by MD) 30 patch 0   lidocaine (LIDODERM) 5 % Place 1 patch onto the skin daily. Remove & Discard patch within 12 hours or as directed by MD  30 patch 0   Naproxen Sod-diphenhydrAMINE (ALEVE PM PO) Take 3 tablets by mouth daily as needed (pain).     ondansetron (ZOFRAN-ODT) 4 MG disintegrating tablet Take 4 mg by mouth every 8 (eight) hours as needed for nausea or vomiting.     oxyCODONE-acetaminophen (PERCOCET) 10-325 MG per tablet Take 1 tablet by mouth every 8 (eight) hours as needed for pain.      pramipexole (MIRAPEX) 1.5 MG tablet Take 1.5 mg by mouth 3 (three) times daily.     No current facility-administered medications for this visit.    Allergies:   Pentosan polysulfate sodium, Elmiron [pentosan polysulfate], and Naloxone   Social History:   The patient  reports that she has never smoked. She has never used smokeless tobacco. She reports that she does not drink alcohol and does not use drugs.   Family History:  The patient's family history is not on file.   ROS:  Please see the history of present illness.   Otherwise, review of systems is positive for none.   All other systems are reviewed and negative.   PHYSICAL EXAM: VS:  There were no vitals taken for this visit. , BMI There is no height or weight on file to calculate BMI. GEN: Well nourished, well developed, in no acute distress  HEENT: normal  Neck: no JVD, carotid bruits, or masses Cardiac: ***RRR; no murmurs, rubs, or gallops,no edema  Respiratory:  clear to auscultation bilaterally, normal work of breathing GI: soft, nontender, nondistended, + BS MS: no deformity or atrophy  Skin: warm and dry, device site well healed Neuro:  Strength and sensation are intact Psych: euthymic mood, full affect  EKG:  EKG {ACTION; IS/IS [id number]} ordered today. Personal review of the ekg ordered *** shows ***  Personal review of the device interrogation today. Results in [facility]    Recent Labs: 01/10/2020: B Natriuretic Peptide 68.8 09/14/2020: ALT 14; Magnesium 2.2; TSH 9.210 09/15/2020: BUN 14; Creatinine, Ser 0.99; Hemoglobin 11.2; Platelets 201; Potassium 3.8; Sodium 134    Lipid Panel  No results found for: CHOL, TRIG, HDL, CHOLHDL, VLDL, LDLCALC, LDLDIRECT   Wt Readings from Last 3 Encounters:  09/15/20 212 lb 11.2 oz (96.5 kg)  07/28/20 199 lb 15.3 oz (90.7 kg)  07/14/20 233 lb (105.7 kg)      Other studies Reviewed: Additional studies/ records that were reviewed today include: TTE 08/16/2019 Review of the above records today demonstrates:   1. Left ventricular ejection fraction, by estimation, is 55 to 60%. The  left ventricle has normal function. The left ventricle has no regional  wall motion abnormalities. There is mild concentric left ventricular   hypertrophy. Left ventricular diastolic  parameters are consistent with Grade I diastolic dysfunction (impaired  relaxation).   2. Right ventricular systolic function is normal. The right ventricular  size is normal. There is normal pulmonary artery systolic pressure.   3. Left atrial size was mild to moderately dilated.   4. Right atrial size was mildly dilated.   5. The mitral valve is normal in structure. Trivial mitral valve  regurgitation. No evidence of mitral stenosis.   6. The aortic valve is tricuspid. Aortic valve regurgitation is not  visualized. Mild aortic valve sclerosis is present, with no evidence of  aortic valve stenosis.    ASSESSMENT AND PLAN:  1.  Sick sinus syndrome: Status post Medtronic dual-chamber pacemaker implanted 08/16/2019.  Device functioning properly.  No changes at this time.  2.  Diabetes, last A1c 7.5.  Plan per primary care.   Current medicines are reviewed at length with the patient today.   The patient does not have concerns regarding her medicines.  The following changes were made today:  ***  Labs/ tests ordered today include:  No orders of the defined types were placed in this encounter.    Disposition:   FU with Khyrie Masi *** months  Signed, Val Schiavo Jorja Loa, MD  09/17/2020 2:03 PM     [facility] [address] [place] [address] [phone] (office) [phone] (fax)

## 2020-09-28 ENCOUNTER — Emergency Department (HOSPITAL_BASED_OUTPATIENT_CLINIC_OR_DEPARTMENT_OTHER)
Admission: EM | Admit: 2020-09-28 | Discharge: 2020-09-29 | Disposition: A | Discharge status: Home / Self Care | Payer: Medicare Other | Attending: Emergency Medicine | Admitting: Emergency Medicine

## 2020-09-28 ENCOUNTER — Other Ambulatory Visit: Payer: Self-pay

## 2020-09-28 DIAGNOSIS — Z96611 Presence of right artificial shoulder joint: Secondary | ICD-10-CM | POA: Diagnosis not present

## 2020-09-28 DIAGNOSIS — Z794 Long term (current) use of insulin: Secondary | ICD-10-CM | POA: Diagnosis not present

## 2020-09-28 DIAGNOSIS — R600 Localized edema: Secondary | ICD-10-CM | POA: Diagnosis not present

## 2020-09-28 DIAGNOSIS — R0602 Shortness of breath: Secondary | ICD-10-CM | POA: Insufficient documentation

## 2020-09-28 DIAGNOSIS — R0789 Other chest pain: Secondary | ICD-10-CM | POA: Insufficient documentation

## 2020-09-28 DIAGNOSIS — Z950 Presence of cardiac pacemaker: Secondary | ICD-10-CM | POA: Diagnosis not present

## 2020-09-28 DIAGNOSIS — Z79899 Other long term (current) drug therapy: Secondary | ICD-10-CM | POA: Insufficient documentation

## 2020-09-28 DIAGNOSIS — Z7982 Long term (current) use of aspirin: Secondary | ICD-10-CM | POA: Diagnosis not present

## 2020-09-28 DIAGNOSIS — R079 Chest pain, unspecified: Secondary | ICD-10-CM | POA: Diagnosis present

## 2020-09-28 DIAGNOSIS — E119 Type 2 diabetes mellitus without complications: Secondary | ICD-10-CM | POA: Insufficient documentation

## 2020-09-29 ENCOUNTER — Emergency Department (HOSPITAL_BASED_OUTPATIENT_CLINIC_OR_DEPARTMENT_OTHER): Payer: Medicare Other

## 2020-09-29 DIAGNOSIS — R0789 Other chest pain: Secondary | ICD-10-CM | POA: Diagnosis not present

## 2020-09-29 LAB — COMPREHENSIVE METABOLIC PANEL
ALT: 16 U/L (ref 0–44)
AST: 22 U/L (ref 15–41)
Albumin: 4 g/dL (ref 3.5–5.0)
Alkaline Phosphatase: 111 U/L (ref 38–126)
Anion gap: 12 (ref 5–15)
BUN: 25 mg/dL — ABNORMAL HIGH (ref 8–23)
CO2: 29 mmol/L (ref 22–32)
Calcium: 6.9 mg/dL — ABNORMAL LOW (ref 8.9–10.3)
Chloride: 91 mmol/L — ABNORMAL LOW (ref 98–111)
Creatinine, Ser: 1.35 mg/dL — ABNORMAL HIGH (ref 0.44–1.00)
GFR, Estimated: 41 mL/min — ABNORMAL LOW (ref >60)
Glucose, Bld: 222 mg/dL — ABNORMAL HIGH (ref 70–99)
Potassium: 3.4 mmol/L — ABNORMAL LOW (ref 3.5–5.1)
Sodium: 132 mmol/L — ABNORMAL LOW (ref 135–145)
Total Bilirubin: 0.5 mg/dL (ref 0.3–1.2)
Total Protein: 8.3 g/dL — ABNORMAL HIGH (ref 6.5–8.1)

## 2020-09-29 LAB — CBC WITH DIFFERENTIAL/PLATELET
Abs Immature Granulocytes: 0.04 10*3/uL (ref 0.00–0.07)
Basophils Absolute: 0 10*3/uL (ref 0.0–0.1)
Basophils Relative: 1 %
Eosinophils Absolute: 0.2 10*3/uL (ref 0.0–0.5)
Eosinophils Relative: 3 %
HCT: 39.9 % (ref 36.0–46.0)
Hemoglobin: 13.4 g/dL (ref 12.0–15.0)
Immature Granulocytes: 1 %
Lymphocytes Relative: 20 %
Lymphs Abs: 1.8 10*3/uL (ref 0.7–4.0)
MCH: 29.1 pg (ref 26.0–34.0)
MCHC: 33.6 g/dL (ref 30.0–36.0)
MCV: 86.6 fL (ref 80.0–100.0)
Monocytes Absolute: 0.6 10*3/uL (ref 0.1–1.0)
Monocytes Relative: 7 %
Neutro Abs: 6 10*3/uL (ref 1.7–7.7)
Neutrophils Relative %: 68 %
Platelets: 254 10*3/uL (ref 150–400)
RBC: 4.61 MIL/uL (ref 3.87–5.11)
RDW: 21.2 % — ABNORMAL HIGH (ref 11.5–15.5)
Smear Review: NORMAL
WBC: 8.7 10*3/uL (ref 4.0–10.5)
nRBC: 0 % (ref 0.0–0.2)

## 2020-09-29 LAB — TROPONIN I (HIGH SENSITIVITY)
Troponin I (High Sensitivity): 88 ng/L — ABNORMAL HIGH (ref <18)
Troponin I (High Sensitivity): 80 ng/L — ABNORMAL HIGH (ref <18)

## 2020-09-29 MED ORDER — ALUM & MAG HYDROXIDE-SIMETH 200-200-20 MG/5ML PO SUSP
15.0000 mL | Freq: Once | ORAL | Status: AC
  Administered 2020-09-29: 15 mL via ORAL
  Filled 2020-09-29: qty 30

## 2020-09-29 MED ORDER — IOHEXOL 350 MG/ML SOLN
100.0000 mL | Freq: Once | INTRAVENOUS | Status: AC | PRN
  Administered 2020-09-29: 80 mL via INTRAVENOUS

## 2020-09-29 MED ORDER — OXYCODONE-ACETAMINOPHEN 5-325 MG PO TABS
1.0000 | ORAL_TABLET | Freq: Once | ORAL | Status: AC
  Administered 2020-09-29: 1 via ORAL
  Filled 2020-09-29: qty 1

## 2020-09-29 NOTE — ED Notes (Signed)
Patient transported to X-ray 

## 2020-09-29 NOTE — ED Provider Notes (Signed)
[facility] EMERGENCY DEPARTMENT Provider Note   CSN: [id number] Arrival date & time: 09/28/20  2357     History Chief Complaint  Patient presents with   Chest Pain    Jillian Leblanc is a 75 y.o. female.  The history is provided by the patient and medical records.  Chest Pain Jillian Leblanc is a [age] y.o. female who presents to the Emergency Department complaining of chest pain. She presents the emergency department accompanied by her husband for evaluation of right-sided chest pain that started a few hours ago. Pain is described as sharp. It is central and right-sided and is nonradiating. Pain is constant. Pain is worse with palpation and deep breath. She has associated shortness of breath. No fevers, cough, abdominal pain, nausea, vomiting. She does have bilateral lower extremity edema, which she states is been present for a long time. She does have a recent hospitalization for recurrent falls. She reports recent shoulder surgery several weeks ago. She is not anticoagulated. No history of DVT/PE.    Past Medical History:  Diagnosis Date   Diabetes mellitus without complication (HCC)    Interstitial cystitis    Thyroid disease     Patient Active Problem List   Diagnosis Date Noted   Multiple falls 09/14/2020   Pressure injury of skin 09/14/2020   Generalized weakness 09/14/2020   Symptomatic anemia 07/26/2020   Acute kidney injury superimposed on chronic kidney disease (HCC) 07/26/2020   Hyponatremia 07/26/2020   Hypokalemia 07/26/2020   Dislocated shoulder, right, initial encounter 07/26/2020   Closed displaced fracture of coracoid process of right shoulder 07/26/2020   Diabetes mellitus type 2 in obese (HCC) 07/26/2020   Elevated troponin 07/26/2020   Recurrent falls 08/16/2019   Sinus arrest 08/15/2019   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   Cervicalgia of occipito-atlanto-axial region 06/26/2019   Head injury 06/26/2019   Frequent falls 06/26/2019    Past  Surgical History:  Procedure Laterality Date   ABDOMINAL HYSTERECTOMY     CHOLECYSTECTOMY     NECK SURGERY     PACEMAKER IMPLANT N/A 08/16/2019   Procedure: PACEMAKER IMPLANT;  Surgeon: Regan Lemmingamnitz, Will Martin, MD;  Location: [facility];  Service: Cardiovascular;  Laterality: N/A;   REVERSE SHOULDER ARTHROPLASTY Right 07/28/2020   Procedure: REVERSE SHOULDER ARTHROPLASTY;  Surgeon: Francena HanlySupple, Kevin, MD;  Location: [facility];  Service: Orthopedics;  Laterality: Right;   SHOULDER SURGERY     THYROIDECTOMY       OB History   No obstetric history on file.     No family history on file.  Social History   Tobacco Use   Smoking status: Never   Smokeless tobacco: Never  Vaping Use   Vaping Use: Never used  Substance Use Topics   Alcohol use: No   Drug use: Never    Home Medications Prior to Admission medications   Medication Sig Start Date End Date Taking? Authorizing Provider  amitriptyline (ELAVIL) 100 MG tablet Take 100 mg by mouth at bedtime. 08/01/19   [provider]  ASPIRIN LOW DOSE 81 MG EC tablet Take 81 mg by mouth daily. 06/16/19   [provider]  calcium-vitamin D 250-100 MG-UNIT tablet Take 1 tablet by mouth 2 (two) times daily. 09/14/20   Palumbo, April, MD  FARXIGA 10 MG TABS tablet Take 10 mg by mouth daily. 06/29/20   [provider]  ferrous sulfate 325 (65 FE) MG tablet Take 1 tablet (325 mg total) by mouth daily with breakfast. 07/29/20  Amin, Ankit Chirag, MD  fluticasone (FLONASE) 50 MCG/ACT nasal spray Place 1 spray into both nostrils daily as needed for allergies. 08/06/20   [provider]  furosemide (LASIX) 40 MG tablet Take 40 mg by mouth 2 (two) times daily. 07/19/20   [provider]  hydrOXYzine (ATARAX/VISTARIL) 25 MG tablet Take 1-2 tablets by mouth at bedtime as needed for anxiety. 06/14/19   [provider]  ibuprofen (ADVIL) 600 MG tablet Take 600 mg by mouth every 8 (eight) hours as needed for moderate  pain.  06/11/19   [provider]  insulin glargine, 1 Unit Dial, (TOUJEO) 300 UNIT/ML Solostar Pen Inject 16 Units into the skin daily.    [provider]  levothyroxine (SYNTHROID) 125 MCG tablet Take 1 tablet (125 mcg total) by mouth daily. Patient not taking: No sig reported 10/18/19   Barrett, Joline Salt, PA-C  levothyroxine (SYNTHROID) 150 MCG tablet Take 150 mcg by mouth daily. 09/01/20   [provider]  lidocaine (LIDODERM) 5 % Place 1 patch onto the skin daily. Remove & Discard patch within 12 hours or as directed by MD Patient taking differently: Place 1 patch onto the skin daily as needed (pain). Remove & Discard patch within 12 hours or as directed by MD 07/14/20   Henderly, Britni A, PA-C  lidocaine (LIDODERM) 5 % Place 1 patch onto the skin daily. Remove & Discard patch within 12 hours or as directed by MD 09/14/20   Nicanor Alcon, April, MD  Naproxen Sod-diphenhydrAMINE (ALEVE PM PO) Take 3 tablets by mouth daily as needed (pain).    [provider]  ondansetron (ZOFRAN-ODT) 4 MG disintegrating tablet Take 4 mg by mouth every 8 (eight) hours as needed for nausea or vomiting. 09/02/20   [provider]  oxyCODONE-acetaminophen (PERCOCET) 10-325 MG per tablet Take 1 tablet by mouth every 8 (eight) hours as needed for pain.     [provider]  pramipexole (MIRAPEX) 1.5 MG tablet Take 1.5 mg by mouth 3 (three) times daily. 06/29/20   [provider]    Allergies    Pentosan polysulfate sodium, Elmiron [pentosan polysulfate], and Naloxone  Review of Systems   Review of Systems  Cardiovascular:  Positive for chest pain.  All other systems reviewed and are negative.  Physical Exam Updated Vital Signs BP (!) 121/58   Pulse 94   Temp 99.1 F (37.3 C) (Oral)   Resp 19   Ht 5' (1.524 m)   Wt 93.9 kg   SpO2 95%   BMI 40.43 kg/m   Physical Exam Vitals and nursing note reviewed.  Constitutional:      Appearance: She is  well-developed.  HENT:     Head: Normocephalic and atraumatic.  Cardiovascular:     Rate and Rhythm: Normal rate and regular rhythm.     Heart sounds: No murmur heard. Pulmonary:     Effort: Pulmonary effort is normal. No respiratory distress.     Breath sounds: Normal breath sounds.  Chest:     Chest wall: Tenderness present.  Abdominal:     Palpations: Abdomen is soft.     Tenderness: There is no abdominal tenderness. There is no guarding or rebound.  Musculoskeletal:        General: No tenderness.     Comments: Pitting edema to bilateral lower extremities, left greater than right.  Skin:    General: Skin is warm and dry.  Neurological:     Mental Status: She is alert and oriented  to person, place, and time.  Psychiatric:        Behavior: Behavior normal.    ED Results / Procedures / Treatments   Labs (all labs ordered are listed, but only abnormal results are displayed) Labs Reviewed  COMPREHENSIVE METABOLIC PANEL - Abnormal; Notable for the following components:      Result Value   Sodium 132 (*)    Potassium 3.4 (*)    Chloride 91 (*)    Glucose, Bld 222 (*)    BUN 25 (*)    Creatinine, Ser 1.35 (*)    Calcium 6.9 (*)    Total Protein 8.3 (*)    GFR, Estimated 41 (*)    All other components within normal limits  CBC WITH DIFFERENTIAL/PLATELET - Abnormal; Notable for the following components:   RDW 21.2 (*)    All other components within normal limits  TROPONIN I (HIGH SENSITIVITY) - Abnormal; Notable for the following components:   Troponin I (High Sensitivity) 88 (*)    All other components within normal limits  TROPONIN I (HIGH SENSITIVITY) - Abnormal; Notable for the following components:   Troponin I (High Sensitivity) 80 (*)    All other components within normal limits    EKG EKG Interpretation  Date/Time:  Tuesday September 29 2020 00:08:45 EDT Ventricular Rate:  88 PR Interval:    QRS Duration: 109 QT Interval:  401 QTC Calculation: 486 R  Axis:   -38 Text Interpretation: Sinus rhythm Incomplete left bundle branch block Borderline prolonged QT interval Confirmed by Tilden Fossa [phone]) on 09/29/2020 12:11:59 AM  Radiology DG Chest 2 View  Result Date: 09/29/2020 CLINICAL DATA:  Right-sided chest pain.  Dizziness. EXAM: CHEST - 2 VIEW COMPARISON:  Radiograph 09/13/2020 FINDINGS: Left-sided pacemaker unchanged in position.The cardiomediastinal contours are stable. Aortic atherosclerosis. Unchanged linear opacity in the right mid lung, scarring versus minimal fluid in the fissure. Pulmonary vasculature is normal. No consolidation, pleural effusion, or pneumothorax. Reverse right shoulder arthroplasty. No acute osseous abnormalities are seen. Stable compression fracture at the thoracolumbar junction from 07/25/2020 chest radiograph. IMPRESSION: No acute chest findings. Electronically Signed   By: Narda Rutherford M.D.   On: 09/29/2020 01:06   CT Angio Chest PE W/Cm &/Or Wo Cm  Result Date: 09/29/2020 CLINICAL DATA:  PE suspected, high prob Chest pain.  Dizziness. EXAM: CT ANGIOGRAPHY CHEST WITH CONTRAST TECHNIQUE: Multidetector CT imaging of the chest was performed using the standard protocol during bolus administration of intravenous contrast. Multiplanar CT image reconstructions and MIPs were obtained to evaluate the vascular anatomy. CONTRAST:  14mL OMNIPAQUE IOHEXOL 350 MG/ML SOLN COMPARISON:  Radiograph earlier today. FINDINGS: Cardiovascular: There are no filling defects within the pulmonary arteries to suggest pulmonary embolus. There is cardiac motion in the lingula that causes volume averaging involving the segmental pulmonary arteries. Thoracic aortic atherosclerosis. Calcified is well as irregular noncalcified atheromatous plaque. No acute aortic findings. No aortic aneurysm. Left-sided pacemaker in place. Leads in the right atrium and right ventricle. Heart is normal in size. No pericardial effusion. Mediastinum/Nodes: No enlarged  mediastinal or hilar lymph nodes. Decompressed esophagus. Lungs/Pleura: Heterogeneous pulmonary parenchyma. There is mild central bronchial thickening. No confluent airspace disease. No pleural fluid. No septal thickening or findings of pulmonary edema. Trace fissural thickening of the right minor fissure. No pulmonary mass. Upper Abdomen: No acute or unexpected findings. Musculoskeletal: Reverse right shoulder arthroplasty. Right scapular spine fracture has callus formation and is healing. Thoracic spondylosis with endplate spurring. Review of the MIP images confirms  the above findings. IMPRESSION: 1. No pulmonary embolus. 2. Heterogeneous pulmonary parenchyma may represent small airways or small vessel disease. 3. Mild central bronchial thickening. 4. Healing right scapular spine fracture. Aortic Atherosclerosis (ICD10-I70.0). Electronically Signed   By: Narda Rutherford M.D.   On: 09/29/2020 01:22    Procedures Procedures   Medications Ordered in ED Medications  iohexol (OMNIPAQUE) 350 MG/ML injection 100 mL (80 mLs Intravenous Contrast Given 09/29/20 0105)  alum & mag hydroxide-simeth (MAALOX/MYLANTA) 200-200-20 MG/5ML suspension 15 mL (15 mLs Oral Given 09/29/20 0401)  oxyCODONE-acetaminophen (PERCOCET/ROXICET) 5-325 MG per tablet 1 tablet (1 tablet Oral Given 09/29/20 0421)    ED Course  I have reviewed the triage vital signs and the nursing notes.  Pertinent labs & imaging results that were available during my care of the patient were reviewed by me and considered in my medical decision making (see chart for details).    MDM Rules/Calculators/A&P                          patient with history of diabetes, sick status syndrome status post pacemaker placement here for evaluation of right-sided chest pain that is been present for several hours. Pain is reproducible on evaluation. Troponins are elevated but flat times two. EKG is without acute ischemic changes. Given her recent hospitalization and  surgery CTA was obtained, which is negative for PE or pneumonia. Discussed with on-call cardiologist patient's symptoms and presentation. Patient does have a history of chronically elevated troponin's in the past. Patient reports that she had an outpatient stress test at [facility] in the last few months that was negative. Given atypical symptoms, flat troponins recommendation for discharge with close cardiology follow-up and return precautions. Discussed with patient findings of studies and she is in agreement with treatment plan.  Final Clinical Impression(s) / ED Diagnoses Final diagnoses:  Chest wall pain    Rx / DC Orders ED Discharge Orders          Ordered    Ambulatory referral to Cardiology        09/29/20 0425             Tilden Fossa, MD 09/29/20 [phone]

## 2020-09-29 NOTE — ED Triage Notes (Signed)
Pt. Reports she is having chest pain that started approx. 2 hours ago.  Pt. Reports she has had dizziness and unsteadiness with the chest pain.  No reports of nausea or vomiting.

## 2020-10-19 ENCOUNTER — Encounter: Payer: Self-pay | Admitting: Cardiology

## 2020-10-19 ENCOUNTER — Other Ambulatory Visit: Payer: Self-pay

## 2020-10-19 ENCOUNTER — Ambulatory Visit (INDEPENDENT_AMBULATORY_CARE_PROVIDER_SITE_OTHER): Payer: Medicare Other | Admitting: Cardiology

## 2020-10-19 DIAGNOSIS — I495 Sick sinus syndrome: Secondary | ICD-10-CM | POA: Diagnosis not present

## 2020-10-19 NOTE — Progress Notes (Signed)
Electrophysiology Office Note   Date:  10/19/2020   ID:  Jillian Leblanc, DOB Apr 14, 1945, MRN [id number]  PCP:  Karle Plumber, MD  Cardiologist:  Branch Primary Electrophysiologist:  Keirah Konitzer Jorja Loa, MD    Chief Complaint: sinus pauses   History of Present Illness: Jillian Leblanc is a 75 y.o. female who is being seen today for the evaluation of sinus pauses, pacemaker at the request of Arvind, Idelia Salm, MD. Presenting today for electrophysiology evaluation.  She has a history of diabetes and thyroid disease.  She was hospitalized July 2021 when she was having frequent falls and was found to have sinus pauses.  She has now status post Medtronic dual-chamber pacemaker implanted 08/17/2019.  She presented to the emergency room 09/29/2020 with chest pain.  She was ruled out for cardiac symptoms.  She had a recent stress test at [facility] which was negative for ischemia.  Today, denies symptoms of palpitations, chest pain, shortness of breath, orthopnea, PND, lower extremity edema, claudication, dizziness, presyncope, syncope, bleeding, or neurologic sequela. The patient is tolerating medications without difficulties.  Unfortunately, she has had a series of falls.  She does not feel bad prior to her falls.  She also does not feel that she has lost consciousness around the timing of her falls.  She has no chest pain or shortness of breath associated with these.  She has had multiple injuries including shoulder injuries and she has hit her face.  Pacemaker interrogation shows no arrhythmias that would explain this.  She does have follow-up with her primary physician.  Of note, she states that she has severe pain down her left leg that could indicate nerve issues.  She Franky Reier discuss this also with her primary physician in relation to her falls.   Past Medical History:  Diagnosis Date   Diabetes mellitus without complication (HCC)    Interstitial cystitis    Thyroid disease     Past Surgical History:  Procedure Laterality Date   ABDOMINAL HYSTERECTOMY     CHOLECYSTECTOMY     NECK SURGERY     PACEMAKER IMPLANT N/A 08/16/2019   Procedure: PACEMAKER IMPLANT;  Surgeon: Regan Lemming, MD;  Location: [facility];  Service: Cardiovascular;  Laterality: N/A;   REVERSE SHOULDER ARTHROPLASTY Right 07/28/2020   Procedure: REVERSE SHOULDER ARTHROPLASTY;  Surgeon: Francena Hanly, MD;  Location: [facility];  Service: Orthopedics;  Laterality: Right;   SHOULDER SURGERY     THYROIDECTOMY       Current Outpatient Medications  Medication Sig Dispense Refill   amitriptyline (ELAVIL) 100 MG tablet Take 100 mg by mouth at bedtime.     ASPIRIN LOW DOSE 81 MG EC tablet Take 81 mg by mouth daily.     calcium-vitamin D 250-100 MG-UNIT tablet Take 1 tablet by mouth 2 (two) times daily. 30 tablet 0   FARXIGA 10 MG TABS tablet Take 10 mg by mouth daily.     ferrous sulfate 325 (65 FE) MG tablet Take 1 tablet (325 mg total) by mouth daily with breakfast. 30 tablet 3   fluticasone (FLONASE) 50 MCG/ACT nasal spray Place 1 spray into both nostrils daily as needed for allergies.     furosemide (LASIX) 40 MG tablet Take 40 mg by mouth 2 (two) times daily.     hydrOXYzine (ATARAX/VISTARIL) 25 MG tablet Take 1-2 tablets by mouth at bedtime as needed for anxiety.     ibuprofen (ADVIL) 600 MG tablet Take 600 mg by mouth every  8 (eight) hours as needed for moderate pain.      insulin glargine, 1 Unit Dial, (TOUJEO) 300 UNIT/ML Solostar Pen Inject 16 Units into the skin daily.     levothyroxine (SYNTHROID) 125 MCG tablet Take 1 tablet (125 mcg total) by mouth daily. 30 tablet 1   levothyroxine (SYNTHROID) 150 MCG tablet Take 150 mcg by mouth daily.     lidocaine (LIDODERM) 5 % Place 1 patch onto the skin daily. Remove & Discard patch within 12 hours or as directed by MD (Patient taking differently: Place 1 patch onto the skin daily as needed (pain). Remove & Discard patch within 12 hours or  as directed by MD) 30 patch 0   lidocaine (LIDODERM) 5 % Place 1 patch onto the skin daily. Remove & Discard patch within 12 hours or as directed by MD 30 patch 0   Naproxen Sod-diphenhydrAMINE (ALEVE PM PO) Take 3 tablets by mouth daily as needed (pain).     ondansetron (ZOFRAN-ODT) 4 MG disintegrating tablet Take 4 mg by mouth every 8 (eight) hours as needed for nausea or vomiting.     oxyCODONE-acetaminophen (PERCOCET) 10-325 MG per tablet Take 1 tablet by mouth every 8 (eight) hours as needed for pain.      pramipexole (MIRAPEX) 1.5 MG tablet Take 1.5 mg by mouth 3 (three) times daily.     No current facility-administered medications for this visit.    Allergies:   Pentosan polysulfate sodium, Elmiron [pentosan polysulfate], and Naloxone   Social History:  The patient  reports that she has never smoked. She has never used smokeless tobacco. She reports that she does not drink alcohol and does not use drugs.   Family History:  The patient's family history is not on file.   ROS:  Please see the history of present illness.   Otherwise, review of systems is positive for none.   All other systems are reviewed and negative.   PHYSICAL EXAM: VS:  There were no vitals taken for this visit. , BMI There is no height or weight on file to calculate BMI. GEN: Well nourished, well developed, in no acute distress  HEENT: normal  Neck: no JVD, carotid bruits, or masses Cardiac: RRR; no murmurs, rubs, or gallops,no edema  Respiratory:  clear to auscultation bilaterally, normal work of breathing GI: soft, nontender, nondistended, + BS MS: no deformity or atrophy  Skin: warm and dry, device site well healed Neuro:  Strength and sensation are intact Psych: euthymic mood, full affect  EKG:  EKG is not ordered today. Personal review of the ekg ordered 09/29/20 shows sinus rhythm, rate 88  Personal review of the device interrogation today. Results in [facility]    Recent Labs: 01/10/2020: B Natriuretic  Peptide 68.8 09/14/2020: Magnesium 2.2; TSH 9.210 09/29/2020: ALT 16; BUN 25; Creatinine, Ser 1.35; Hemoglobin 13.4; Platelets 254; Potassium 3.4; Sodium 132    Lipid Panel  No results found for: CHOL, TRIG, HDL, CHOLHDL, VLDL, LDLCALC, LDLDIRECT   Wt Readings from Last 3 Encounters:  09/29/20 207 lb (93.9 kg)  09/15/20 212 lb 11.2 oz (96.5 kg)  07/28/20 199 lb 15.3 oz (90.7 kg)      Other studies Reviewed: Additional studies/ records that were reviewed today include: TTE 08/16/2019 Review of the above records today demonstrates:   1. Left ventricular ejection fraction, by estimation, is 55 to 60%. The  left ventricle has normal function. The left ventricle has no regional  wall motion abnormalities. There is mild concentric left  ventricular  hypertrophy. Left ventricular diastolic  parameters are consistent with Grade I diastolic dysfunction (impaired  relaxation).   2. Right ventricular systolic function is normal. The right ventricular  size is normal. There is normal pulmonary artery systolic pressure.   3. Left atrial size was mild to moderately dilated.   4. Right atrial size was mildly dilated.   5. The mitral valve is normal in structure. Trivial mitral valve  regurgitation. No evidence of mitral stenosis.   6. The aortic valve is tricuspid. Aortic valve regurgitation is not  visualized. Mild aortic valve sclerosis is present, with no evidence of  aortic valve stenosis.    ASSESSMENT AND PLAN:  1.  Sick sinus syndrome: Status post Medtronic dual-chamber pacemaker.  Device functioning appropriately.  No changes at this time.  2.  Falls: Patient has been having falls over the last few weeks.  She actually fell and hit her face.  She feels that she has not lost consciousness.  In review of device interrogation, there are no arrhythmias that would explain this.  She Arvella Massingale discuss this further with her primary physician who she sees later this week.  Current medicines are  reviewed at length with the patient today.   The patient does not have concerns regarding her medicines.  The following changes were made today: None  Labs/ tests ordered today include:  No orders of the defined types were placed in this encounter.    Disposition:   FU with Marne Meline 12 months  Signed, Pixie Burgener Jorja Loa, MD  10/19/2020 3:22 PM     [facility] [address] [place] [address] [phone] (office) [phone] (fax)

## 2020-11-16 ENCOUNTER — Ambulatory Visit (INDEPENDENT_AMBULATORY_CARE_PROVIDER_SITE_OTHER): Payer: Medicare Other

## 2020-11-16 DIAGNOSIS — I495 Sick sinus syndrome: Secondary | ICD-10-CM | POA: Diagnosis not present

## 2020-11-17 LAB — CUP PACEART REMOTE DEVICE CHECK
Battery Remaining Longevity: 164 mo
Battery Voltage: 3.07 V
Brady Statistic AP VP Percent: 0.01 %
Brady Statistic AP VS Percent: 9.91 %
Brady Statistic AS VP Percent: 0.03 %
Brady Statistic AS VS Percent: 90.05 %
Brady Statistic RA Percent Paced: 10.06 %
Brady Statistic RV Percent Paced: 0.04 %
Date Time Interrogation Session: 20221024034246
Implantable Lead Implant Date: 20210723
Implantable Lead Implant Date: 20210723
Implantable Lead Location: 753859
Implantable Lead Location: 753860
Implantable Lead Model: 5076
Implantable Lead Model: 5076
Implantable Pulse Generator Implant Date: 20210723
Lead Channel Impedance Value: 304 Ohm
Lead Channel Impedance Value: 323 Ohm
Lead Channel Impedance Value: 361 Ohm
Lead Channel Impedance Value: 418 Ohm
Lead Channel Pacing Threshold Amplitude: 0.5 V
Lead Channel Pacing Threshold Amplitude: 0.875 V
Lead Channel Pacing Threshold Pulse Width: 0.4 ms
Lead Channel Pacing Threshold Pulse Width: 0.4 ms
Lead Channel Sensing Intrinsic Amplitude: 3.625 mV
Lead Channel Sensing Intrinsic Amplitude: 3.625 mV
Lead Channel Sensing Intrinsic Amplitude: 8.125 mV
Lead Channel Sensing Intrinsic Amplitude: 8.125 mV
Lead Channel Setting Pacing Amplitude: 1.5 V
Lead Channel Setting Pacing Amplitude: 2.5 V
Lead Channel Setting Pacing Pulse Width: 0.4 ms
Lead Channel Setting Sensing Sensitivity: 0.9 mV

## 2020-11-22 ENCOUNTER — Emergency Department (HOSPITAL_BASED_OUTPATIENT_CLINIC_OR_DEPARTMENT_OTHER): Payer: Medicare Other

## 2020-11-22 ENCOUNTER — Other Ambulatory Visit: Payer: Self-pay

## 2020-11-22 ENCOUNTER — Inpatient Hospital Stay (HOSPITAL_BASED_OUTPATIENT_CLINIC_OR_DEPARTMENT_OTHER)
Admission: EM | Admit: 2020-11-22 | Discharge: 2020-11-27 | DRG: 542 | Disposition: A | Discharge status: Home Health | Payer: Medicare Other | Attending: Internal Medicine | Admitting: Internal Medicine

## 2020-11-22 DIAGNOSIS — Z7982 Long term (current) use of aspirin: Secondary | ICD-10-CM

## 2020-11-22 DIAGNOSIS — G8929 Other chronic pain: Secondary | ICD-10-CM | POA: Diagnosis present

## 2020-11-22 DIAGNOSIS — E039 Hypothyroidism, unspecified: Secondary | ICD-10-CM | POA: Diagnosis present

## 2020-11-22 DIAGNOSIS — I447 Left bundle-branch block, unspecified: Secondary | ICD-10-CM | POA: Diagnosis present

## 2020-11-22 DIAGNOSIS — Z79899 Other long term (current) drug therapy: Secondary | ICD-10-CM

## 2020-11-22 DIAGNOSIS — R296 Repeated falls: Secondary | ICD-10-CM | POA: Diagnosis present

## 2020-11-22 DIAGNOSIS — R55 Syncope and collapse: Secondary | ICD-10-CM | POA: Diagnosis present

## 2020-11-22 DIAGNOSIS — R748 Abnormal levels of other serum enzymes: Secondary | ICD-10-CM | POA: Diagnosis present

## 2020-11-22 DIAGNOSIS — Z95810 Presence of automatic (implantable) cardiac defibrillator: Secondary | ICD-10-CM

## 2020-11-22 DIAGNOSIS — J9601 Acute respiratory failure with hypoxia: Secondary | ICD-10-CM | POA: Diagnosis present

## 2020-11-22 DIAGNOSIS — M79674 Pain in right toe(s): Secondary | ICD-10-CM | POA: Diagnosis present

## 2020-11-22 DIAGNOSIS — E11649 Type 2 diabetes mellitus with hypoglycemia without coma: Secondary | ICD-10-CM

## 2020-11-22 DIAGNOSIS — Z79890 Hormone replacement therapy (postmenopausal): Secondary | ICD-10-CM

## 2020-11-22 DIAGNOSIS — I495 Sick sinus syndrome: Secondary | ICD-10-CM | POA: Diagnosis present

## 2020-11-22 DIAGNOSIS — N3010 Interstitial cystitis (chronic) without hematuria: Secondary | ICD-10-CM | POA: Diagnosis present

## 2020-11-22 DIAGNOSIS — T50995A Adverse effect of other drugs, medicaments and biological substances, initial encounter: Secondary | ICD-10-CM | POA: Diagnosis not present

## 2020-11-22 DIAGNOSIS — I472 Ventricular tachycardia, unspecified: Secondary | ICD-10-CM | POA: Diagnosis present

## 2020-11-22 DIAGNOSIS — N179 Acute kidney failure, unspecified: Secondary | ICD-10-CM | POA: Diagnosis present

## 2020-11-22 DIAGNOSIS — M25552 Pain in left hip: Secondary | ICD-10-CM | POA: Diagnosis present

## 2020-11-22 DIAGNOSIS — Z96612 Presence of left artificial shoulder joint: Secondary | ICD-10-CM | POA: Diagnosis present

## 2020-11-22 DIAGNOSIS — E119 Type 2 diabetes mellitus without complications: Secondary | ICD-10-CM

## 2020-11-22 DIAGNOSIS — Z9181 History of falling: Secondary | ICD-10-CM

## 2020-11-22 DIAGNOSIS — E876 Hypokalemia: Secondary | ICD-10-CM | POA: Diagnosis present

## 2020-11-22 DIAGNOSIS — M4856XA Collapsed vertebra, not elsewhere classified, lumbar region, initial encounter for fracture: Secondary | ICD-10-CM | POA: Diagnosis not present

## 2020-11-22 DIAGNOSIS — Z20822 Contact with and (suspected) exposure to covid-19: Secondary | ICD-10-CM | POA: Diagnosis present

## 2020-11-22 DIAGNOSIS — G9349 Other encephalopathy: Secondary | ICD-10-CM | POA: Diagnosis not present

## 2020-11-22 DIAGNOSIS — E8809 Other disorders of plasma-protein metabolism, not elsewhere classified: Secondary | ICD-10-CM | POA: Diagnosis present

## 2020-11-22 DIAGNOSIS — M4854XA Collapsed vertebra, not elsewhere classified, thoracic region, initial encounter for fracture: Secondary | ICD-10-CM | POA: Diagnosis present

## 2020-11-22 DIAGNOSIS — N1330 Unspecified hydronephrosis: Secondary | ICD-10-CM | POA: Diagnosis present

## 2020-11-22 DIAGNOSIS — Z96611 Presence of right artificial shoulder joint: Secondary | ICD-10-CM | POA: Diagnosis present

## 2020-11-22 DIAGNOSIS — Z888 Allergy status to other drugs, medicaments and biological substances status: Secondary | ICD-10-CM

## 2020-11-22 DIAGNOSIS — Z794 Long term (current) use of insulin: Secondary | ICD-10-CM

## 2020-11-22 DIAGNOSIS — R7401 Elevation of levels of liver transaminase levels: Secondary | ICD-10-CM

## 2020-11-22 DIAGNOSIS — S32000A Wedge compression fracture of unspecified lumbar vertebra, initial encounter for closed fracture: Secondary | ICD-10-CM

## 2020-11-22 DIAGNOSIS — E669 Obesity, unspecified: Secondary | ICD-10-CM | POA: Diagnosis present

## 2020-11-22 DIAGNOSIS — R252 Cramp and spasm: Secondary | ICD-10-CM | POA: Diagnosis present

## 2020-11-22 DIAGNOSIS — E1142 Type 2 diabetes mellitus with diabetic polyneuropathy: Secondary | ICD-10-CM | POA: Diagnosis present

## 2020-11-22 DIAGNOSIS — Z6841 Body Mass Index (BMI) 40.0 and over, adult: Secondary | ICD-10-CM

## 2020-11-22 DIAGNOSIS — W19XXXA Unspecified fall, initial encounter: Secondary | ICD-10-CM | POA: Diagnosis present

## 2020-11-22 LAB — COMPREHENSIVE METABOLIC PANEL
ALT: 41 U/L (ref 0–44)
AST: 50 U/L — ABNORMAL HIGH (ref 15–41)
Albumin: 3.3 g/dL — ABNORMAL LOW (ref 3.5–5.0)
Alkaline Phosphatase: 182 U/L — ABNORMAL HIGH (ref 38–126)
Anion gap: 18 — ABNORMAL HIGH (ref 5–15)
BUN: 37 mg/dL — ABNORMAL HIGH (ref 8–23)
CO2: 25 mmol/L (ref 22–32)
Calcium: 6.6 mg/dL — ABNORMAL LOW (ref 8.9–10.3)
Chloride: 89 mmol/L — ABNORMAL LOW (ref 98–111)
Creatinine, Ser: 1.78 mg/dL — ABNORMAL HIGH (ref 0.44–1.00)
GFR, Estimated: 30 mL/min — ABNORMAL LOW (ref >60)
Glucose, Bld: 75 mg/dL (ref 70–99)
Potassium: 3.3 mmol/L — ABNORMAL LOW (ref 3.5–5.1)
Sodium: 132 mmol/L — ABNORMAL LOW (ref 135–145)
Total Bilirubin: 0.8 mg/dL (ref 0.3–1.2)
Total Protein: 7.1 g/dL (ref 6.5–8.1)

## 2020-11-22 LAB — CBC WITH DIFFERENTIAL/PLATELET
Abs Immature Granulocytes: 0.04 10*3/uL (ref 0.00–0.07)
Basophils Absolute: 0 10*3/uL (ref 0.0–0.1)
Basophils Relative: 0 %
Eosinophils Absolute: 0 10*3/uL (ref 0.0–0.5)
Eosinophils Relative: 1 %
HCT: 32.5 % — ABNORMAL LOW (ref 36.0–46.0)
Hemoglobin: 11 g/dL — ABNORMAL LOW (ref 12.0–15.0)
Immature Granulocytes: 1 %
Lymphocytes Relative: 13 %
Lymphs Abs: 0.8 10*3/uL (ref 0.7–4.0)
MCH: 29.6 pg (ref 26.0–34.0)
MCHC: 33.8 g/dL (ref 30.0–36.0)
MCV: 87.6 fL (ref 80.0–100.0)
Monocytes Absolute: 0.4 10*3/uL (ref 0.1–1.0)
Monocytes Relative: 7 %
Neutro Abs: 5 10*3/uL (ref 1.7–7.7)
Neutrophils Relative %: 78 %
Platelets: 243 10*3/uL (ref 150–400)
RBC: 3.71 MIL/uL — ABNORMAL LOW (ref 3.87–5.11)
RDW: 14.7 % (ref 11.5–15.5)
WBC: 6.4 10*3/uL (ref 4.0–10.5)
nRBC: 0 % (ref 0.0–0.2)

## 2020-11-22 LAB — LACTIC ACID, PLASMA: Lactic Acid, Venous: 1.1 mmol/L (ref 0.5–1.9)

## 2020-11-22 LAB — CK: Total CK: 214 U/L (ref 38–234)

## 2020-11-22 MED ORDER — FENTANYL CITRATE PF 50 MCG/ML IJ SOSY
50.0000 ug | PREFILLED_SYRINGE | Freq: Once | INTRAMUSCULAR | Status: AC
  Administered 2020-11-22: 50 ug via INTRAVENOUS
  Filled 2020-11-22: qty 1

## 2020-11-22 MED ORDER — CYCLOBENZAPRINE HCL 5 MG PO TABS
5.0000 mg | ORAL_TABLET | Freq: Once | ORAL | Status: AC
  Administered 2020-11-22: 5 mg via ORAL
  Filled 2020-11-22: qty 1

## 2020-11-22 MED ORDER — MORPHINE SULFATE (PF) 4 MG/ML IV SOLN
4.0000 mg | Freq: Once | INTRAVENOUS | Status: AC
  Administered 2020-11-22: 4 mg via INTRAVENOUS
  Filled 2020-11-22: qty 1

## 2020-11-22 MED ORDER — SODIUM CHLORIDE 0.9 % IV BOLUS
1000.0000 mL | Freq: Once | INTRAVENOUS | Status: AC
  Administered 2020-11-22: 1000 mL via INTRAVENOUS

## 2020-11-22 MED ORDER — METHOCARBAMOL 500 MG PO TABS
500.0000 mg | ORAL_TABLET | Freq: Once | ORAL | Status: AC
  Administered 2020-11-22: 500 mg via ORAL
  Filled 2020-11-22: qty 1

## 2020-11-22 NOTE — Plan of Care (Signed)
75 yo F with h/o DM2, CKD3.  In for severe muscle cramps and falls.  Has anion gap of 18, has AKI.  ? PNA CT scan but CXR neg.  EDP wants admit to [facility]  Ordering lactate, CPK, and UA.  [facility] will assume care on arrival to accepting facility. Until arrival, care as per EDP. However, [facility] available 24/7 for questions and assistance.  Nursing staff, please page [facility] Admits and Consults [phone]) as soon as the patient arrives the hospital.

## 2020-11-22 NOTE — ED Triage Notes (Addendum)
Pt's family reports pt has frequent falls. Has been having muscle cramps in lower extremities for several days but is worse today. She took her prescribed pain medication today around 4pm. Pt restless and moaning in triage, unable to sit still. Large bruise/reddened area noted to front of left upper arm. Family states it may have been from the application of a blood pressure monitor to the back of her arm but is not certain.

## 2020-11-22 NOTE — ED Provider Notes (Signed)
[facility] EMERGENCY DEPARTMENT Provider Note   CSN: [id number] Arrival date & time: 11/22/20  1811     History Chief Complaint  Patient presents with   Fall    Muscle cramps    Rickie Waller is a 75 y.o. female with history of diabetes mellitus, thyroid disease, CKD stage III, frequent falls.  Presents to the emergency department with a chief complaint of fall, leg cramping and leg spasms.    Patient reports that she suffered fall on Monday.  Patient reports hitting her head.  Denies any loss of consciousness.  Fall occurred at ground-level.  [name] not on any blood thinners.  Patient complains of pain above left eye from this fall.  Patient rates pain 10 on the pain scale.  Pain is worse with touch.  Patient has had no relief with prescribed pain medication at home.  Patient also complains of pain to bilateral hips due to  Patient complains of cramps and muscle burns to bilateral lower extremities.  This symptom has been present over the last 2 days.  Patient complains of pain due to her cramping.  Patient rates pain 10/10 on the pain scale.  Patient denies any aggravating factors.  Patient has not had any relief with her prescribed pain medication.    Fall Pertinent negatives include no chest pain, no abdominal pain, no headaches and no shortness of breath.      Past Medical History:  Diagnosis Date   Diabetes mellitus without complication (HCC)    Interstitial cystitis    Thyroid disease     Patient Active Problem List   Diagnosis Date Noted   Multiple falls 09/14/2020   Pressure injury of skin 09/14/2020   Generalized weakness 09/14/2020   Symptomatic anemia 07/26/2020   Acute kidney injury superimposed on chronic kidney disease (HCC) 07/26/2020   Hyponatremia 07/26/2020   Hypokalemia 07/26/2020   Dislocated shoulder, right, initial encounter 07/26/2020   Closed displaced fracture of coracoid process of right shoulder 07/26/2020   Diabetes mellitus  type 2 in obese (HCC) 07/26/2020   Elevated troponin 07/26/2020   Recurrent falls 08/16/2019   Sinus arrest 08/15/2019   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   Cervicalgia of occipito-atlanto-axial region 06/26/2019   Head injury 06/26/2019   Frequent falls 06/26/2019    Past Surgical History:  Procedure Laterality Date   ABDOMINAL HYSTERECTOMY     CHOLECYSTECTOMY     NECK SURGERY     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     No family history on file.  Social History   Tobacco Use   Smoking status: Never   Smokeless tobacco: Never  Vaping Use   Vaping Use: Never used  Substance Use Topics   Alcohol use: No   Drug use: Never    Home Medications Prior to Admission medications   Medication Sig Start Date End Date Taking? Authorizing Provider  amitriptyline (ELAVIL) 100 MG tablet Take 100 mg by mouth at bedtime. 08/01/19   [provider]  ASPIRIN LOW DOSE 81 MG EC tablet Take 81 mg  by mouth daily. 06/16/19   [provider]  calcium-vitamin D 250-100 MG-UNIT tablet Take 1 tablet by mouth 2 (two) times daily. 09/14/20   Palumbo, April, MD  FARXIGA 10 MG TABS tablet Take 10 mg by mouth daily. 06/29/20   [provider]  ferrous sulfate 325 (65 FE) MG tablet Take 1 tablet (325 mg total) by mouth daily with breakfast. 07/29/20   Amin, Loura Halt, MD  fluticasone (FLONASE) 50 MCG/ACT nasal spray Place 1 spray into both nostrils daily as needed for allergies. 08/06/20   [provider]  furosemide (LASIX) 40 MG tablet Take 40 mg by mouth 2 (two)  times daily. 07/19/20   [provider]  hydrOXYzine (ATARAX/VISTARIL) 25 MG tablet Take 1-2 tablets by mouth at bedtime as needed for anxiety. 06/14/19   [provider]  ibuprofen (ADVIL) 600 MG tablet Take 600 mg by mouth every 8 (eight) hours as needed for moderate pain.  06/11/19   [provider]  insulin glargine, 1 Unit Dial, (TOUJEO) 300 UNIT/ML Solostar Pen Inject 16 Units into the skin daily.    [provider]  levothyroxine (SYNTHROID) 125 MCG tablet Take 1 tablet (125 mcg total) by mouth daily. 10/18/19   Barrett, Joline Salt, PA-C  levothyroxine (SYNTHROID) 150 MCG tablet Take 150 mcg by mouth daily. 09/01/20   [provider]  lidocaine (LIDODERM) 5 % Place 1 patch onto the skin daily. Remove & Discard patch within 12 hours or as directed by MD Patient taking differently: Place 1 patch onto the skin daily as needed (pain). Remove & Discard patch within 12 hours or as directed by MD 07/14/20   Henderly, Britni A, PA-C  lidocaine (LIDODERM) 5 % Place 1 patch onto the skin daily. Remove & Discard patch within 12 hours or as directed by MD 09/14/20   Nicanor Alcon, April, MD  Naproxen Sod-diphenhydrAMINE (ALEVE PM PO) Take 3 tablets by mouth daily as needed (pain).    [provider]  ondansetron (ZOFRAN-ODT) 4 MG disintegrating tablet Take 4 mg by mouth every 8 (eight) hours as needed for nausea or vomiting. 09/02/20   [provider]  oxyCODONE-acetaminophen (PERCOCET) 10-325 MG per tablet Take 1 tablet by mouth every 8 (eight) hours as needed for pain.     [provider]  pramipexole (MIRAPEX) 1.5 MG tablet Take 1.5 mg by mouth 3 (three) times daily. 06/29/20   [provider]    Allergies    Pentosan polysulfate sodium, Elmiron [pentosan polysulfate], and Naloxone  Review of Systems   Review of Systems  Constitutional:  Negative for chills and fever.  HENT:  Negative for facial swelling.   Eyes:  Negative for visual  disturbance.  Respiratory:  Negative for shortness of breath.   Cardiovascular:  Negative for chest pain.  Gastrointestinal:  Negative for abdominal pain, nausea and vomiting.  Genitourinary:  Negative for difficulty urinating, dysuria, hematuria and urgency.  Musculoskeletal:  Positive for myalgias and neck pain. Negative for back pain.  Skin:  Negative for color change and rash.  Neurological:  Negative for dizziness, tremors, seizures, syncope, facial asymmetry, speech difficulty, light-headedness, numbness and headaches.  Psychiatric/Behavioral:  Negative for confusion.    Physical Exam Updated Vital Signs BP (!) 141/107 (BP Location: Right Arm)   Pulse 97   Temp 98.4 F (36.9 C) (Oral)   Resp (!) 22   Ht 5' (1.524 m)   Wt 95.3 kg   SpO2 98%   BMI 41.01 kg/m  Physical Exam Vitals and nursing note reviewed.  Constitutional:      General: She is not in acute distress.    Appearance: She is not ill-appearing, toxic-appearing or diaphoretic.     Comments: Appears uncomfortable due to complaints of leg cramping and spasms.  HENT:     Head: Normocephalic. No raccoon eyes, Battle's sign, abrasion, contusion, masses, right periorbital erythema, left periorbital erythema or laceration.     Jaw: No trismus, tenderness, swelling, pain on movement or malocclusion.     Comments: Bruising in various stages of healing located throughout patient's face.  Patient has tenderness above left eyebrow. Eyes:     General: No scleral icterus.       Right eye: No discharge.        Left eye: No discharge.     Extraocular Movements: Extraocular movements intact.     Conjunctiva/sclera: Conjunctivae normal.     Pupils: Pupils are equal, round, and reactive to light.  Cardiovascular:     Rate and Rhythm: Normal rate.  Pulmonary:     Effort: Pulmonary effort is normal.  Chest:     Chest wall: No tenderness or crepitus.  Abdominal:     General: Bowel sounds are normal. There is no distension.  There are no signs of injury.     Palpations: Abdomen is soft. There is no mass or pulsatile mass.     Tenderness: There is no abdominal tenderness. There is no guarding or rebound.  Musculoskeletal:     Cervical back: Normal, normal range of motion and neck supple. No edema, erythema, signs of trauma, rigidity, torticollis or crepitus. No pain with movement, spinous process tenderness or muscular tenderness. Normal range of motion.     Thoracic back: No swelling, edema, deformity, signs of trauma, lacerations, spasms, tenderness or bony tenderness. Scoliosis present.     Lumbar back: Tenderness present. No swelling, edema, deformity, signs of trauma, lacerations, spasms or bony tenderness. Scoliosis present.     Comments: No midline tenderness or deformity to cervical, thoracic, or lumbar spine.  Tenderness to bilateral lumbar paraspinous muscles.  Pelvis stable.  No leg length discrepancy or rotation of bilateral lower extremities.  No tenderness, bony tenderness, or deformity to bilateral upper or lower extremities.  Patient has multiple areas of ecchymosis to bilateral lower legs and right upper extremity.  Skin:    General: Skin is warm and dry.  Neurological:     General: No focal deficit present.     Mental Status: She is alert.     GCS: GCS eye subscore is 4. GCS verbal subscore is 5. GCS motor subscore is 6.     Cranial Nerves: Cranial nerves 2-12 are intact. No cranial nerve deficit, dysarthria or facial asymmetry.     Sensory: Sensation is intact.     Motor: No pronator drift.     Comments: Sensation to light touch intact to bilateral upper and lower extremities.  Patient able to move all limbs equally without difficulty.  Psychiatric:        Behavior: Behavior is cooperative.    ED Results / Procedures / Treatments   Labs (all labs ordered are listed, but only abnormal results are displayed) Labs Reviewed  COMPREHENSIVE METABOLIC PANEL  CBC WITH DIFFERENTIAL/PLATELET   URINALYSIS, ROUTINE W REFLEX MICROSCOPIC    EKG None  Radiology No results found.  Procedures Procedures   Medications Ordered in ED Medications  sodium chloride 0.9 % bolus 1,000 mL (has no administration in time range)  cyclobenzaprine (FLEXERIL) tablet 5 mg (5 mg Oral Given 11/22/20 1934)    ED Course  I have reviewed the triage vital signs and the nursing notes.  Pertinent labs & imaging results that were available during my care of the patient were reviewed by me and considered in my medical decision making (see chart for details).    MDM Rules/Calculators/A&P                           Alert 75 year old female no acute distress, nontoxic-appearing.  Presents to ED with chief complaint of frequent falls and cramping/spasms to bilateral lower extremities.  Patient reports that last major fall occurred on Monday.  Patient endorses hitting her head but denies any loss of consciousness.  Will obtain noncontrast head CT, cervical spine CT, maxillofacial CT, lumbar spine x-ray, and pelvis x-ray to evaluate for injuries from patient's fall.  Patient reports that cramping and spasm has been present over the last 2 days.  Will obtain EKG, CMP, CBC, urinalysis to look for cause of patient's symptoms.  Plan to give patient 1 L fluid bolus as well as Flexeril to help with pain management.  Patient care transferred to Dr.Dykstra at the end of my shift. Patient presentation, ED course, and plan of care discussed with review of all pertinent labs and imaging. Please see his/her note for further details regarding further ED course and disposition.   Final Clinical Impression(s) / ED Diagnoses Final diagnoses:  None    Rx / DC Orders ED Discharge Orders     None        Berneice Heinrich 11/22/20 2028    Milagros Loll, MD 11/22/20 2300

## 2020-11-23 ENCOUNTER — Encounter (HOSPITAL_COMMUNITY): Payer: Self-pay | Admitting: Internal Medicine

## 2020-11-23 ENCOUNTER — Inpatient Hospital Stay (HOSPITAL_COMMUNITY): Payer: Medicare Other

## 2020-11-23 DIAGNOSIS — E8809 Other disorders of plasma-protein metabolism, not elsewhere classified: Secondary | ICD-10-CM | POA: Diagnosis present

## 2020-11-23 DIAGNOSIS — N179 Acute kidney failure, unspecified: Secondary | ICD-10-CM | POA: Diagnosis present

## 2020-11-23 DIAGNOSIS — I472 Ventricular tachycardia, unspecified: Secondary | ICD-10-CM | POA: Diagnosis present

## 2020-11-23 DIAGNOSIS — Z6841 Body Mass Index (BMI) 40.0 and over, adult: Secondary | ICD-10-CM | POA: Diagnosis not present

## 2020-11-23 DIAGNOSIS — I447 Left bundle-branch block, unspecified: Secondary | ICD-10-CM | POA: Diagnosis present

## 2020-11-23 DIAGNOSIS — W19XXXA Unspecified fall, initial encounter: Secondary | ICD-10-CM | POA: Diagnosis present

## 2020-11-23 DIAGNOSIS — Z20822 Contact with and (suspected) exposure to covid-19: Secondary | ICD-10-CM | POA: Diagnosis present

## 2020-11-23 DIAGNOSIS — Z95810 Presence of automatic (implantable) cardiac defibrillator: Secondary | ICD-10-CM | POA: Diagnosis not present

## 2020-11-23 DIAGNOSIS — Z96612 Presence of left artificial shoulder joint: Secondary | ICD-10-CM | POA: Diagnosis present

## 2020-11-23 DIAGNOSIS — E039 Hypothyroidism, unspecified: Secondary | ICD-10-CM | POA: Diagnosis present

## 2020-11-23 DIAGNOSIS — G8929 Other chronic pain: Secondary | ICD-10-CM | POA: Diagnosis present

## 2020-11-23 DIAGNOSIS — Z96611 Presence of right artificial shoulder joint: Secondary | ICD-10-CM | POA: Diagnosis present

## 2020-11-23 DIAGNOSIS — S32000A Wedge compression fracture of unspecified lumbar vertebra, initial encounter for closed fracture: Secondary | ICD-10-CM | POA: Diagnosis not present

## 2020-11-23 DIAGNOSIS — R55 Syncope and collapse: Secondary | ICD-10-CM

## 2020-11-23 DIAGNOSIS — E11649 Type 2 diabetes mellitus with hypoglycemia without coma: Secondary | ICD-10-CM | POA: Diagnosis not present

## 2020-11-23 DIAGNOSIS — I495 Sick sinus syndrome: Secondary | ICD-10-CM | POA: Diagnosis present

## 2020-11-23 DIAGNOSIS — M4856XA Collapsed vertebra, not elsewhere classified, lumbar region, initial encounter for fracture: Secondary | ICD-10-CM | POA: Diagnosis present

## 2020-11-23 DIAGNOSIS — E876 Hypokalemia: Secondary | ICD-10-CM | POA: Diagnosis present

## 2020-11-23 DIAGNOSIS — E669 Obesity, unspecified: Secondary | ICD-10-CM | POA: Diagnosis present

## 2020-11-23 DIAGNOSIS — J9601 Acute respiratory failure with hypoxia: Secondary | ICD-10-CM | POA: Diagnosis present

## 2020-11-23 DIAGNOSIS — R296 Repeated falls: Secondary | ICD-10-CM | POA: Diagnosis present

## 2020-11-23 DIAGNOSIS — Z9181 History of falling: Secondary | ICD-10-CM | POA: Diagnosis not present

## 2020-11-23 DIAGNOSIS — E1142 Type 2 diabetes mellitus with diabetic polyneuropathy: Secondary | ICD-10-CM | POA: Diagnosis present

## 2020-11-23 DIAGNOSIS — G9349 Other encephalopathy: Secondary | ICD-10-CM | POA: Diagnosis not present

## 2020-11-23 DIAGNOSIS — M4854XA Collapsed vertebra, not elsewhere classified, thoracic region, initial encounter for fracture: Secondary | ICD-10-CM | POA: Diagnosis present

## 2020-11-23 DIAGNOSIS — N1330 Unspecified hydronephrosis: Secondary | ICD-10-CM | POA: Diagnosis present

## 2020-11-23 DIAGNOSIS — E119 Type 2 diabetes mellitus without complications: Secondary | ICD-10-CM | POA: Diagnosis not present

## 2020-11-23 LAB — COMPREHENSIVE METABOLIC PANEL
ALT: 44 U/L (ref 0–44)
AST: 81 U/L — ABNORMAL HIGH (ref 15–41)
Albumin: 3.2 g/dL — ABNORMAL LOW (ref 3.5–5.0)
Alkaline Phosphatase: 207 U/L — ABNORMAL HIGH (ref 38–126)
Anion gap: 12 (ref 5–15)
BUN: 41 mg/dL — ABNORMAL HIGH (ref 8–23)
CO2: 27 mmol/L (ref 22–32)
Calcium: 6.1 mg/dL — CL (ref 8.9–10.3)
Chloride: 93 mmol/L — ABNORMAL LOW (ref 98–111)
Creatinine, Ser: 1.99 mg/dL — ABNORMAL HIGH (ref 0.44–1.00)
GFR, Estimated: 26 mL/min — ABNORMAL LOW (ref >60)
Glucose, Bld: 63 mg/dL — ABNORMAL LOW (ref 70–99)
Potassium: 3.2 mmol/L — ABNORMAL LOW (ref 3.5–5.1)
Sodium: 132 mmol/L — ABNORMAL LOW (ref 135–145)
Total Bilirubin: 1.4 mg/dL — ABNORMAL HIGH (ref 0.3–1.2)
Total Protein: 7.1 g/dL (ref 6.5–8.1)

## 2020-11-23 LAB — URINALYSIS, MICROSCOPIC (REFLEX)

## 2020-11-23 LAB — CBC WITH DIFFERENTIAL/PLATELET
Abs Immature Granulocytes: 0.03 10*3/uL (ref 0.00–0.07)
Basophils Absolute: 0 10*3/uL (ref 0.0–0.1)
Basophils Relative: 0 %
Eosinophils Absolute: 0.1 10*3/uL (ref 0.0–0.5)
Eosinophils Relative: 1 %
HCT: 32.9 % — ABNORMAL LOW (ref 36.0–46.0)
Hemoglobin: 10.9 g/dL — ABNORMAL LOW (ref 12.0–15.0)
Immature Granulocytes: 1 %
Lymphocytes Relative: 17 %
Lymphs Abs: 1 10*3/uL (ref 0.7–4.0)
MCH: 29.9 pg (ref 26.0–34.0)
MCHC: 33.1 g/dL (ref 30.0–36.0)
MCV: 90.1 fL (ref 80.0–100.0)
Monocytes Absolute: 0.5 10*3/uL (ref 0.1–1.0)
Monocytes Relative: 8 %
Neutro Abs: 4.2 10*3/uL (ref 1.7–7.7)
Neutrophils Relative %: 73 %
Platelets: 224 10*3/uL (ref 150–400)
RBC: 3.65 MIL/uL — ABNORMAL LOW (ref 3.87–5.11)
RDW: 14.9 % (ref 11.5–15.5)
WBC: 5.7 10*3/uL (ref 4.0–10.5)
nRBC: 0 % (ref 0.0–0.2)

## 2020-11-23 LAB — RESP PANEL BY RT-PCR (FLU A&B, COVID) ARPGX2
Influenza A by PCR: NEGATIVE
Influenza B by PCR: NEGATIVE
SARS Coronavirus 2 by RT PCR: NEGATIVE

## 2020-11-23 LAB — GAMMA GT: GGT: 109 U/L — ABNORMAL HIGH (ref 7–50)

## 2020-11-23 LAB — URINALYSIS, ROUTINE W REFLEX MICROSCOPIC
Bilirubin Urine: NEGATIVE
Glucose, UA: NEGATIVE mg/dL
Hgb urine dipstick: NEGATIVE
Ketones, ur: NEGATIVE mg/dL
Nitrite: NEGATIVE
Protein, ur: NEGATIVE mg/dL
Specific Gravity, Urine: 1.01 (ref 1.005–1.030)
pH: 5.5 (ref 5.0–8.0)

## 2020-11-23 LAB — HEMOGLOBIN A1C
Hgb A1c MFr Bld: 8.4 % — ABNORMAL HIGH (ref 4.8–5.6)
Mean Plasma Glucose: 194.38 mg/dL

## 2020-11-23 LAB — GLUCOSE, CAPILLARY: Glucose-Capillary: 91 mg/dL (ref 70–99)

## 2020-11-23 LAB — MAGNESIUM: Magnesium: 2 mg/dL (ref 1.7–2.4)

## 2020-11-23 LAB — PROCALCITONIN: Procalcitonin: 0.21 ng/mL

## 2020-11-23 MED ORDER — FENTANYL CITRATE PF 50 MCG/ML IJ SOSY
100.0000 ug | PREFILLED_SYRINGE | INTRAMUSCULAR | Status: DC | PRN
  Administered 2020-11-23 – 2020-11-24 (×3): 100 ug via INTRAVENOUS
  Filled 2020-11-23 (×2): qty 2

## 2020-11-23 MED ORDER — ACETAMINOPHEN 325 MG PO TABS
650.0000 mg | ORAL_TABLET | Freq: Four times a day (QID) | ORAL | Status: DC | PRN
  Administered 2020-11-26: 650 mg via ORAL
  Filled 2020-11-23: qty 2

## 2020-11-23 MED ORDER — ONDANSETRON HCL 4 MG PO TABS: 4.0000 mg | ORAL_TABLET | Freq: Four times a day (QID) | ORAL | Status: DC | PRN

## 2020-11-23 MED ORDER — POTASSIUM CHLORIDE 10 MEQ/100ML IV SOLN
10.0000 meq | INTRAVENOUS | Status: AC
  Administered 2020-11-23 (×4): 10 meq via INTRAVENOUS
  Filled 2020-11-23 (×4): qty 100

## 2020-11-23 MED ORDER — INSULIN ASPART 100 UNIT/ML IJ SOLN
0.0000 [IU] | Freq: Three times a day (TID) | INTRAMUSCULAR | Status: DC
  Administered 2020-11-24: 3 [IU] via SUBCUTANEOUS

## 2020-11-23 MED ORDER — CALCIUM GLUCONATE-NACL 2-0.675 GM/100ML-% IV SOLN
2.0000 g | Freq: Once | INTRAVENOUS | Status: AC
  Administered 2020-11-23: 2000 mg via INTRAVENOUS
  Filled 2020-11-23: qty 100

## 2020-11-23 MED ORDER — MORPHINE SULFATE (PF) 4 MG/ML IV SOLN
4.0000 mg | Freq: Once | INTRAVENOUS | Status: AC
  Administered 2020-11-23: 4 mg via INTRAVENOUS
  Filled 2020-11-23: qty 1

## 2020-11-23 MED ORDER — ACETAMINOPHEN 650 MG RE SUPP: 650.0000 mg | Freq: Four times a day (QID) | RECTAL | Status: DC | PRN

## 2020-11-23 MED ORDER — INSULIN ASPART 100 UNIT/ML IJ SOLN: 0.0000 [IU] | Freq: Every day | INTRAMUSCULAR | Status: DC

## 2020-11-23 MED ORDER — FENTANYL CITRATE PF 50 MCG/ML IJ SOSY
100.0000 ug | PREFILLED_SYRINGE | INTRAMUSCULAR | Status: DC | PRN
  Administered 2020-11-23: 100 ug via INTRAVENOUS
  Filled 2020-11-23 (×2): qty 2

## 2020-11-23 MED ORDER — CALCIUM GLUCONATE-NACL 1-0.675 GM/50ML-% IV SOLN: 1.0000 g | Freq: Once | INTRAVENOUS | Status: DC

## 2020-11-23 MED ORDER — ONDANSETRON HCL 4 MG/2ML IJ SOLN: 4.0000 mg | Freq: Four times a day (QID) | INTRAMUSCULAR | Status: DC | PRN

## 2020-11-23 NOTE — ED Notes (Signed)
[facility] Transport Team at bedside 

## 2020-11-23 NOTE — Progress Notes (Signed)
Called 6N nurses station to give report with no answer. Pt will be going to room 06. Maybe they will call me when they get a chance. Started pt on Calcium before transport due to critical lab value.

## 2020-11-23 NOTE — ED Notes (Signed)
Phone Handoff Report provided to [facility] Transport Team 

## 2020-11-23 NOTE — ED Notes (Signed)
Phone Handoff Report given to rec RN at [facility] [place]

## 2020-11-23 NOTE — H&P (Addendum)
History and Physical    Jillian Leblanc SWF:[id number] DOB: Nov 03, 1945 DOA: 11/22/2020  PCP: Guadlupe Spanish, MD  Patient coming from: home  Chief Complaint: falls  HPI: Jillian Leblanc is a 75 y.o. female with medical history significant of hypothyroidism, DM2, SSS, interstitial cystitis. Presenting with falls. Hx from daughter and patient. She reports multiple falls over the last several months. 1 week ago, she was walking to the end of the driveway to pick up her newspaper. She bent over and fell head first. A neighbor helped her back to her house. She has had multiple falls at her house since. She has had spasms in her back over the last 2 days that have led to multiple falls. She is unable to remember all of these falls. Her family became concerned with the pain and additional falls, so they brought her to the ED for evaluation.   ED Course: CT Lumbar spine showed acute L5 compression fracture. She was noted to have AKI. She was noted to be hypoxic on RA. She was started on fluids, pain control. TRH was called for admission.   Review of Systems:  Review of systems is otherwise negative for all not mentioned in HPI.   PMHx Past Medical History:  Diagnosis Date   Diabetes mellitus without complication ([place])    Interstitial cystitis    Thyroid disease     PSHx Past Surgical History:  Procedure Laterality Date   ABDOMINAL HYSTERECTOMY     CHOLECYSTECTOMY     NECK SURGERY     PACEMAKER IMPLANT N/A 08/16/2019   Procedure: PACEMAKER IMPLANT;  Surgeon: Constance Haw, MD;  Location: [facility];  Service: Cardiovascular;  Laterality: N/A;   REVERSE SHOULDER ARTHROPLASTY Right 07/28/2020   Procedure: REVERSE SHOULDER ARTHROPLASTY;  Surgeon: Justice Britain, MD;  Location: [facility];  Service: Orthopedics;  Laterality: Right;   SHOULDER SURGERY     THYROIDECTOMY      SocHx  reports that she has never smoked. She has never used smokeless tobacco. She reports that she does not  drink alcohol and does not use drugs.  Allergies  Allergen Reactions   Pentosan Polysulfate Sodium     Other reaction(s): Other (See Comments) ELMIRON Y Drug hair fell out 10/21/2009 12:00:00 AM by [name] CNA 1   Elmiron [Pentosan Polysulfate]    Naloxone Other (See Comments)    Hallucinations Confusion Nightmares    FamHx History reviewed. No pertinent family history.  Prior to Admission medications   Medication Sig Start Date End Date Taking? Authorizing Provider  amitriptyline (ELAVIL) 100 MG tablet Take 100 mg by mouth at bedtime. 08/01/19   [provider]  ASPIRIN LOW DOSE 81 MG EC tablet Take 81 mg by mouth daily. 06/16/19   [provider]  calcium-vitamin D 250-100 MG-UNIT tablet Take 1 tablet by mouth 2 (two) times daily. 09/14/20   Palumbo, April, MD  FARXIGA 10 MG TABS tablet Take 10 mg by mouth daily. 06/29/20   [provider]  ferrous sulfate 325 (65 FE) MG tablet Take 1 tablet (325 mg total) by mouth daily with breakfast. 07/29/20   Amin, Jeanella Flattery, MD  fluticasone (FLONASE) 50 MCG/ACT nasal spray Place 1 spray into both nostrils daily as needed for allergies. 08/06/20   [provider]  furosemide (LASIX) 40 MG tablet Take 40 mg by mouth 2 (two) times daily. 07/19/20   [provider]  hydrOXYzine (ATARAX/VISTARIL) 25 MG tablet Take 1-2 tablets by mouth at bedtime  as needed for anxiety. 06/14/19   [provider]  ibuprofen (ADVIL) 600 MG tablet Take 600 mg by mouth every 8 (eight) hours as needed for moderate pain.  06/11/19   [provider]  insulin glargine, 1 Unit Dial, (TOUJEO) 300 UNIT/ML Solostar Pen Inject 16 Units into the skin daily.    [provider]  levothyroxine (SYNTHROID) 125 MCG tablet Take 1 tablet (125 mcg total) by mouth daily. 10/18/19   Barrett, Evelene Croon, PA-C  levothyroxine (SYNTHROID) 150 MCG tablet Take 150 mcg by mouth daily. 09/01/20   [provider]  lidocaine  (LIDODERM) 5 % Place 1 patch onto the skin daily. Remove & Discard patch within 12 hours or as directed by MD Patient taking differently: Place 1 patch onto the skin daily as needed (pain). Remove & Discard patch within 12 hours or as directed by MD 07/14/20   Henderly, Britni A, PA-C  lidocaine (LIDODERM) 5 % Place 1 patch onto the skin daily. Remove & Discard patch within 12 hours or as directed by MD 09/14/20   Randal Buba, April, MD  Naproxen Sod-diphenhydrAMINE (ALEVE PM PO) Take 3 tablets by mouth daily as needed (pain).    [provider]  ondansetron (ZOFRAN-ODT) 4 MG disintegrating tablet Take 4 mg by mouth every 8 (eight) hours as needed for nausea or vomiting. 09/02/20   [provider]  oxyCODONE-acetaminophen (PERCOCET) 10-325 MG per tablet Take 1 tablet by mouth every 8 (eight) hours as needed for pain.     [provider]  pramipexole (MIRAPEX) 1.5 MG tablet Take 1.5 mg by mouth 3 (three) times daily. 06/29/20   [provider]    Physical Exam: Vitals:   11/23/20 0700 11/23/20 0819 11/23/20 1100 11/23/20 1302  BP: (!) 142/60 117/60 128/78 126/65  Pulse: 76 72 82 70  Resp: '14 12 18 18  ' Temp:  98 F (36.7 C)    TempSrc:  Oral    SpO2: 98% 97% 98% 100%  Weight:      Height:        General: 75 y.o. female resting in bed in NAD Eyes: PERRL, normal sclera ENMT: Nares patent w/o discharge, orophaynx clear, dentition normal, ears w/o discharge/lesions/ulcers Neck: Supple, trachea midline Cardiovascular: RRR, +S1, S2, no m/g/r, equal pulses throughout Respiratory: CTABL, no w/r/r, normal WOB GI: BS+, NDNT, no masses noted, no organomegaly noted MSK: No e/c/c; TTP lumbar region, spasm noted Neuro: A&O x 3, no focal deficits Psyc: Somewhat confused, calm/cooperative  Labs on Admission: I have personally reviewed following labs and imaging studies  CBC: Recent Labs  Lab 11/22/20 1932  WBC 6.4  NEUTROABS 5.0  HGB 11.0*  HCT 32.5*  MCV 87.6   PLT 867   Basic Metabolic Panel: Recent Labs  Lab 11/22/20 1932  NA 132*  K 3.3*  CL 89*  CO2 25  GLUCOSE 75  BUN 37*  CREATININE 1.78*  CALCIUM 6.6*   GFR: Estimated Creatinine Clearance: 29.1 mL/min (A) (by C-G formula based on SCr of 1.78 mg/dL (H)). Liver Function Tests: Recent Labs  Lab 11/22/20 1932  AST 50*  ALT 41  ALKPHOS 182*  BILITOT 0.8  PROT 7.1  ALBUMIN 3.3*   No results for input(s): LIPASE, AMYLASE in the last 168 hours. No results for input(s): AMMONIA in the last 168 hours. Coagulation Profile: No results for input(s): INR, PROTIME in the last 168 hours. Cardiac Enzymes: Recent Labs  Lab 11/22/20 2249  CKTOTAL 214   BNP (last  3 results) No results for input(s): PROBNP in the last 8760 hours. HbA1C: No results for input(s): HGBA1C in the last 72 hours. CBG: No results for input(s): GLUCAP in the last 168 hours. Lipid Profile: No results for input(s): CHOL, HDL, LDLCALC, TRIG, CHOLHDL, LDLDIRECT in the last 72 hours. Thyroid Function Tests: No results for input(s): TSH, T4TOTAL, FREET4, T3FREE, THYROIDAB in the last 72 hours. Anemia Panel: No results for input(s): VITAMINB12, FOLATE, FERRITIN, TIBC, IRON, RETICCTPCT in the last 72 hours. Urine analysis:    Component Value Date/Time   COLORURINE YELLOW 11/23/2020 0301   APPEARANCEUR CLEAR 11/23/2020 0301   LABSPEC 1.010 11/23/2020 0301   PHURINE 5.5 11/23/2020 0301   GLUCOSEU NEGATIVE 11/23/2020 0301   HGBUR NEGATIVE 11/23/2020 0301   BILIRUBINUR NEGATIVE 11/23/2020 0301   KETONESUR NEGATIVE 11/23/2020 0301   PROTEINUR NEGATIVE 11/23/2020 0301   UROBILINOGEN 0.2 09/15/2012 1536   NITRITE NEGATIVE 11/23/2020 0301   LEUKOCYTESUR SMALL (A) 11/23/2020 0301    Radiological Exams on Admission: DG Lumbar Spine Complete  Result Date: 11/22/2020 CLINICAL DATA:  Status post fall EXAM: LUMBAR SPINE - COMPLETE 4+ VIEW COMPARISON:  CT abdomen pelvis 09/15/2012, CT angiography chest 09/29/2020  FINDINGS: Markedly limited evaluation due to overlapping osseous structures and overlying soft tissues. Five non-rib-bearing lumbar vertebral bodies. Grade 1 anterolisthesis of L5 on S1. Vertebral body height loss of the L5 vertebral body. Interval development of T12 anterior wedge compression fracture. T11 vertebral body height loss. Aortic calcification. Neurostimulator lead overlies the pelvis. Right upper quadrant surgical clips. IMPRESSION: 1. Grade 1 anterolisthesis of L5 on S1. Vertebral body height loss of the L5 vertebral body. Underlying acute component not excluded. Markedly limited evaluation due to overlapping osseous structures and overlying soft tissues. Recommend cross-sectional imaging for further evaluation. 2. Interval development of a T12 anterior wedge compression fracture. Recommend cross-sectional imaging for further evaluation. 3.  Aortic Atherosclerosis (ICD10-I70.0). Electronically Signed   By: Iven Finn M.D.   On: 11/22/2020 20:48   DG Pelvis 1-2 Views  Result Date: 11/22/2020 CLINICAL DATA:  Multiple falls EXAM: PELVIS - 1-2 VIEW COMPARISON:  None. FINDINGS: There is no evidence of pelvic fracture or diastasis. No definite acute displaced fracture or dislocation of bilateral hips. No pelvic bone lesions are seen. IMPRESSION: Negative for acute traumatic injury. Electronically Signed   By: Iven Finn M.D.   On: 11/22/2020 20:49   CT Head Wo Contrast  Result Date: 11/22/2020 CLINICAL DATA:  Head trauma, minor. Facial trauma. Neck trauma. Additional history provided: Frequent falls. EXAM: CT HEAD WITHOUT CONTRAST CT MAXILLOFACIAL WITHOUT CONTRAST CT CERVICAL SPINE WITHOUT CONTRAST TECHNIQUE: Multidetector CT imaging of the head, cervical spine, and maxillofacial structures were performed using the standard protocol without intravenous contrast. Multiplanar CT image reconstructions of the cervical spine and maxillofacial structures were also generated. COMPARISON:  CT of  the head, cervical spine and maxillofacial structures 09/13/2020. FINDINGS: CT HEAD FINDINGS Brain: Cerebral volume is normal for age. There is no acute intracranial hemorrhage. No demarcated cortical infarct. No extra-axial fluid collection. No evidence of an intracranial mass. No midline shift. Vascular: No hyperdense vessel.  Atherosclerotic calcifications. Skull: Normal. Negative for fracture or focal lesion. Other: Left frontotemporal scalp hematoma. CT MAXILLOFACIAL FINDINGS Osseous: No evidence of acute maxillofacial fracture. Orbits: No acute or significant orbital finding. Sinuses: No significant paranasal sinus disease. Soft tissues: Left frontotemporal scalp hematoma. CT CERVICAL SPINE FINDINGS Alignment: Straightening of the expected cervical lordosis. Slight cervical levocurvature. No significant spondylolisthesis. Skull base and vertebrae:  The basion-dental and atlanto-dental intervals are maintained.No evidence of acute fracture to the cervical spine. Soft tissues and spinal canal: No prevertebral fluid or swelling. No visible canal hematoma. Disc levels: Cervical spondylosis with multilevel disc space narrowing, disc bulges/central disc protrusions, uncovertebral hypertrophy and facet arthrosis. Disc space narrowing is greatest at C6-C7 (moderate/advanced at this level). No appreciable high-grade spinal canal stenosis. No high-grade bony neural foraminal narrowing. Upper chest: Patchy ground-glass opacity within the imaged lung apices, nonspecific but possibly reflecting edema or infection. IMPRESSION: CT head: 1. No evidence of acute intracranial abnormality. 2. Left frontotemporal scalp hematoma. CT maxillofacial: 1. No evidence of acute maxillofacial fracture. 2. Left frontotemporal scalp hematoma. CT cervical spine: 1. No evidence of acute fracture to the cervical spine 2. Straightening of the expected cervical lordosis. 3. Slight cervical levocurvature. 4. Cervical spondylosis, as described. 5.  Patchy ground-glass opacity within the imaged lung apices, nonspecific but possibly reflecting edema or infection. Clinical correlation is recommended. Additionally, dedicated chest radiographs are recommended for further evaluation. Electronically Signed   By: Kellie Simmering D.O.   On: 11/22/2020 20:39   CT Cervical Spine Wo Contrast  Result Date: 11/22/2020 CLINICAL DATA:  Head trauma, minor. Facial trauma. Neck trauma. Additional history provided: Frequent falls. EXAM: CT HEAD WITHOUT CONTRAST CT MAXILLOFACIAL WITHOUT CONTRAST CT CERVICAL SPINE WITHOUT CONTRAST TECHNIQUE: Multidetector CT imaging of the head, cervical spine, and maxillofacial structures were performed using the standard protocol without intravenous contrast. Multiplanar CT image reconstructions of the cervical spine and maxillofacial structures were also generated. COMPARISON:  CT of the head, cervical spine and maxillofacial structures 09/13/2020. FINDINGS: CT HEAD FINDINGS Brain: Cerebral volume is normal for age. There is no acute intracranial hemorrhage. No demarcated cortical infarct. No extra-axial fluid collection. No evidence of an intracranial mass. No midline shift. Vascular: No hyperdense vessel.  Atherosclerotic calcifications. Skull: Normal. Negative for fracture or focal lesion. Other: Left frontotemporal scalp hematoma. CT MAXILLOFACIAL FINDINGS Osseous: No evidence of acute maxillofacial fracture. Orbits: No acute or significant orbital finding. Sinuses: No significant paranasal sinus disease. Soft tissues: Left frontotemporal scalp hematoma. CT CERVICAL SPINE FINDINGS Alignment: Straightening of the expected cervical lordosis. Slight cervical levocurvature. No significant spondylolisthesis. Skull base and vertebrae: The basion-dental and atlanto-dental intervals are maintained.No evidence of acute fracture to the cervical spine. Soft tissues and spinal canal: No prevertebral fluid or swelling. No visible canal hematoma. Disc  levels: Cervical spondylosis with multilevel disc space narrowing, disc bulges/central disc protrusions, uncovertebral hypertrophy and facet arthrosis. Disc space narrowing is greatest at C6-C7 (moderate/advanced at this level). No appreciable high-grade spinal canal stenosis. No high-grade bony neural foraminal narrowing. Upper chest: Patchy ground-glass opacity within the imaged lung apices, nonspecific but possibly reflecting edema or infection. IMPRESSION: CT head: 1. No evidence of acute intracranial abnormality. 2. Left frontotemporal scalp hematoma. CT maxillofacial: 1. No evidence of acute maxillofacial fracture. 2. Left frontotemporal scalp hematoma. CT cervical spine: 1. No evidence of acute fracture to the cervical spine 2. Straightening of the expected cervical lordosis. 3. Slight cervical levocurvature. 4. Cervical spondylosis, as described. 5. Patchy ground-glass opacity within the imaged lung apices, nonspecific but possibly reflecting edema or infection. Clinical correlation is recommended. Additionally, dedicated chest radiographs are recommended for further evaluation. Electronically Signed   By: Kellie Simmering D.O.   On: 11/22/2020 20:39   CT Thoracic Spine Wo Contrast  Result Date: 11/23/2020 CLINICAL DATA:  Initial evaluation for acute trauma, evaluate for spine fracture. EXAM: CT THORACIC SPINE WITHOUT CONTRAST TECHNIQUE: Multidetector  CT images of the thoracic were obtained using the standard protocol without intravenous contrast. COMPARISON:  Prior radiograph from earlier the same day. FINDINGS: Alignment: Straightening of the normal thoracic kyphosis. No listhesis. Vertebrae: Compression deformity involving the superior endplate of T73 with up to approximate 40-50% height loss and trace bony retropulsion, chronic in appearance. Minimal chronic height loss noted at the superior endplates of T3 and T4 as well. No acute fracture. Visualized ribs intact. No discrete or worrisome osseous  lesions. Paraspinal and other soft tissues: Paraspinous soft tissues demonstrate no acute finding. Scattered atelectatic changes noted within the visualized lungs. Left-sided pacemaker/AICD in place, partially visualized. Left shoulder arthroplasty partially visualized. Aortic atherosclerosis. Disc levels: No significant disc pathology for patient age. No significant stenosis or neural impingement. IMPRESSION: 1. No acute traumatic injury within the thoracic spine. 2. Chronic compression deformity involving the superior endplate of U20 with up to 40-50% height loss and trace bony retropulsion. 3. Minimal chronic height loss at the superior endplates of T3 and T4. Aortic Atherosclerosis (ICD10-I70.0). Electronically Signed   By: Jeannine Boga M.D.   On: 11/23/2020 01:05   CT Lumbar Spine Wo Contrast  Result Date: 11/23/2020 CLINICAL DATA:  Initial evaluation for acute low back pain status post trauma. EXAM: CT LUMBAR SPINE WITHOUT CONTRAST TECHNIQUE: Multidetector CT imaging of the lumbar spine was performed without intravenous contrast administration. Multiplanar CT image reconstructions were also generated. COMPARISON:  Prior radiograph from 11/22/2020. FINDINGS: Segmentation: Standard. Lowest well-formed disc space labeled the L5-S1 level. Alignment: Trace retrolisthesis of L1 on L2. Alignment otherwise normal with preservation of the normal lumbar lordosis. Vertebrae: There is an acute burst type compression fracture involving the L5 vertebral body. Associated mild central height loss of up to 35% without bony retropulsion. There is an additional acute to subacute fracture of the right transverse process of L1 (series 3, image 35). No other acute fracture within the lumbar spine. Chronic T12 fracture noted, described on corresponding thoracic spine CT. Visualized sacrum and pelvis intact. SI joints symmetric and normal. Subcentimeter benign bone island noted within the left sacral ala. No other  discrete or worrisome osseous lesions. Visualized lower ribs intact. Paraspinal and other soft tissues mild paraspinous edema adjacent to the L1 compression fracture. Paraspinous soft tissues demonstrate no other acute finding. Aorto bi-iliac atherosclerotic disease. Electrode for sacral stimulator noted. Disc levels: L1-2: Degenerative intervertebral disc space narrowing with diffuse disc bulge and disc desiccation. Mild facet hypertrophy. No spinal stenosis. Foramina remain patent. L2-3: Mild disc bulge, eccentric to the right. Mild facet hypertrophy. No significant spinal stenosis. Foramina remain patent. L3-4: Mild disc bulge, asymmetric to the right. Mild to moderate right worse than left facet hypertrophy. No significant spinal stenosis. Mild bilateral foraminal narrowing. L4-5: Mild disc bulge. Moderate facet hypertrophy. Mild narrowing of the lateral recesses bilaterally. Mild bilateral L4 foraminal stenosis. L5-S1: Degenerative intervertebral disc space narrowing with disc desiccation and diffuse disc bulge. Superimposed central to right subarticular disc protrusion closely approximates the descending S1 nerve roots, greater on the right. Mild facet hypertrophy probable mild right lateral recess stenosis. Central canal remains patent. Moderate bilateral foraminal narrowing. IMPRESSION: 1. Acute burst type compression fracture involving the L5 vertebral body with up to 35% central height loss without bony retropulsion. 2. Acute to subacute fracture of the right transverse process of L1. 3. No other acute traumatic injury within the lumbar spine. 4. Central to right subarticular disc protrusion at L5-S1, closely approximating and potentially affecting either of the descending S1  nerve roots. 5. Chronic T12 fracture, described on corresponding thoracic spine CT. Aortic Atherosclerosis (ICD10-I70.0). Electronically Signed   By: Jeannine Boga M.D.   On: 11/23/2020 01:16   DG Chest Portable 1  View  Result Date: 11/22/2020 CLINICAL DATA:  Hypoxia EXAM: PORTABLE CHEST 1 VIEW COMPARISON:  09/29/2020 FINDINGS: Lungs are clear. No pneumothorax or pleural effusion. Left subclavian dual lead pacemaker is unchanged. Cardiac size within normal limits. Pulmonary vascularity is normal. Right total shoulder arthroplasty has been performed. IMPRESSION: No active disease. Electronically Signed   By: Fidela Salisbury M.D.   On: 11/22/2020 21:21   CT Maxillofacial Wo Contrast  Result Date: 11/22/2020 CLINICAL DATA:  Head trauma, minor. Facial trauma. Neck trauma. Additional history provided: Frequent falls. EXAM: CT HEAD WITHOUT CONTRAST CT MAXILLOFACIAL WITHOUT CONTRAST CT CERVICAL SPINE WITHOUT CONTRAST TECHNIQUE: Multidetector CT imaging of the head, cervical spine, and maxillofacial structures were performed using the standard protocol without intravenous contrast. Multiplanar CT image reconstructions of the cervical spine and maxillofacial structures were also generated. COMPARISON:  CT of the head, cervical spine and maxillofacial structures 09/13/2020. FINDINGS: CT HEAD FINDINGS Brain: Cerebral volume is normal for age. There is no acute intracranial hemorrhage. No demarcated cortical infarct. No extra-axial fluid collection. No evidence of an intracranial mass. No midline shift. Vascular: No hyperdense vessel.  Atherosclerotic calcifications. Skull: Normal. Negative for fracture or focal lesion. Other: Left frontotemporal scalp hematoma. CT MAXILLOFACIAL FINDINGS Osseous: No evidence of acute maxillofacial fracture. Orbits: No acute or significant orbital finding. Sinuses: No significant paranasal sinus disease. Soft tissues: Left frontotemporal scalp hematoma. CT CERVICAL SPINE FINDINGS Alignment: Straightening of the expected cervical lordosis. Slight cervical levocurvature. No significant spondylolisthesis. Skull base and vertebrae: The basion-dental and atlanto-dental intervals are maintained.No  evidence of acute fracture to the cervical spine. Soft tissues and spinal canal: No prevertebral fluid or swelling. No visible canal hematoma. Disc levels: Cervical spondylosis with multilevel disc space narrowing, disc bulges/central disc protrusions, uncovertebral hypertrophy and facet arthrosis. Disc space narrowing is greatest at C6-C7 (moderate/advanced at this level). No appreciable high-grade spinal canal stenosis. No high-grade bony neural foraminal narrowing. Upper chest: Patchy ground-glass opacity within the imaged lung apices, nonspecific but possibly reflecting edema or infection. IMPRESSION: CT head: 1. No evidence of acute intracranial abnormality. 2. Left frontotemporal scalp hematoma. CT maxillofacial: 1. No evidence of acute maxillofacial fracture. 2. Left frontotemporal scalp hematoma. CT cervical spine: 1. No evidence of acute fracture to the cervical spine 2. Straightening of the expected cervical lordosis. 3. Slight cervical levocurvature. 4. Cervical spondylosis, as described. 5. Patchy ground-glass opacity within the imaged lung apices, nonspecific but possibly reflecting edema or infection. Clinical correlation is recommended. Additionally, dedicated chest radiographs are recommended for further evaluation. Electronically Signed   By: Kellie Simmering D.O.   On: 11/22/2020 20:39    EKG: Independently reviewed. Sinus, no st elevation  Assessment/Plan Syncope Falls L5 compression     - placed in med-surg obs     - spoke with neurosurgery; rec'd transfer to [facility] for evaluation; possible need for surgery     - will transfer to [facility], tele     - check echo     - hold on brace for now  AKI     - check renal US     - fluids  Hypokalemia Hypocalcemia     - Ca2+ corrects to 7.2; give IV Ca2+     - replace K+, check Mg2+  Elevated alk phos     -  check ggt  Hypoxia     - CT w/ ground glass at apicies: edema vs infection     - check procal  DVT prophylaxis: SCD  Code Status: FULL   Family Communication: spoke w/ dtr by phone Consults called: Neurosurgery   Status is: Inpatient  Remains inpatient appropriate because: severity of illness  Saudia Smyser A Marylyn Ishihara DO [facility]  If 7PM-7AM, please contact night-coverage [email or web address]  11/23/2020, 3:20 PM

## 2020-11-23 NOTE — ED Notes (Signed)
Report rec from off going RN, cont to await for admission bed room assignment

## 2020-11-24 ENCOUNTER — Inpatient Hospital Stay (HOSPITAL_COMMUNITY): Payer: Medicare Other

## 2020-11-24 DIAGNOSIS — W19XXXA Unspecified fall, initial encounter: Secondary | ICD-10-CM

## 2020-11-24 DIAGNOSIS — E119 Type 2 diabetes mellitus without complications: Secondary | ICD-10-CM

## 2020-11-24 DIAGNOSIS — E11649 Type 2 diabetes mellitus with hypoglycemia without coma: Secondary | ICD-10-CM

## 2020-11-24 DIAGNOSIS — R55 Syncope and collapse: Secondary | ICD-10-CM

## 2020-11-24 DIAGNOSIS — E039 Hypothyroidism, unspecified: Secondary | ICD-10-CM

## 2020-11-24 DIAGNOSIS — S32000A Wedge compression fracture of unspecified lumbar vertebra, initial encounter for closed fracture: Secondary | ICD-10-CM

## 2020-11-24 DIAGNOSIS — Z794 Long term (current) use of insulin: Secondary | ICD-10-CM

## 2020-11-24 DIAGNOSIS — R7401 Elevation of levels of liver transaminase levels: Secondary | ICD-10-CM

## 2020-11-24 LAB — ECHOCARDIOGRAM COMPLETE
AR max vel: 2.33 cm2
AV Peak grad: 13.6 mmHg
Ao pk vel: 1.85 m/s
Area-P 1/2: 3.65 cm2
Calc EF: 63.1 %
Height: 60 in
MV VTI: 2.34 cm2
S' Lateral: 2.51 cm
Single Plane A2C EF: 63.9 %
Single Plane A4C EF: 63.2 %
Weight: 3360 oz

## 2020-11-24 LAB — COMPREHENSIVE METABOLIC PANEL
ALT: 36 U/L (ref 0–44)
AST: 47 U/L — ABNORMAL HIGH (ref 15–41)
Albumin: 2.5 g/dL — ABNORMAL LOW (ref 3.5–5.0)
Alkaline Phosphatase: 164 U/L — ABNORMAL HIGH (ref 38–126)
Anion gap: 10 (ref 5–15)
BUN: 28 mg/dL — ABNORMAL HIGH (ref 8–23)
CO2: 25 mmol/L (ref 22–32)
Calcium: 6.6 mg/dL — ABNORMAL LOW (ref 8.9–10.3)
Chloride: 101 mmol/L (ref 98–111)
Creatinine, Ser: 1.61 mg/dL — ABNORMAL HIGH (ref 0.44–1.00)
GFR, Estimated: 34 mL/min — ABNORMAL LOW (ref >60)
Glucose, Bld: 84 mg/dL (ref 70–99)
Potassium: 3.7 mmol/L (ref 3.5–5.1)
Sodium: 136 mmol/L (ref 135–145)
Total Bilirubin: 1 mg/dL (ref 0.3–1.2)
Total Protein: 5.8 g/dL — ABNORMAL LOW (ref 6.5–8.1)

## 2020-11-24 LAB — GLUCOSE, CAPILLARY
Glucose-Capillary: 82 mg/dL (ref 70–99)
Glucose-Capillary: 172 mg/dL — ABNORMAL HIGH (ref 70–99)
Glucose-Capillary: 72 mg/dL (ref 70–99)
Glucose-Capillary: 80 mg/dL (ref 70–99)

## 2020-11-24 LAB — CBC
HCT: 30.4 % — ABNORMAL LOW (ref 36.0–46.0)
Hemoglobin: 10.2 g/dL — ABNORMAL LOW (ref 12.0–15.0)
MCH: 29.6 pg (ref 26.0–34.0)
MCHC: 33.6 g/dL (ref 30.0–36.0)
MCV: 88.1 fL (ref 80.0–100.0)
Platelets: 191 10*3/uL (ref 150–400)
RBC: 3.45 MIL/uL — ABNORMAL LOW (ref 3.87–5.11)
RDW: 14.6 % (ref 11.5–15.5)
WBC: 5.6 10*3/uL (ref 4.0–10.5)
nRBC: 0 % (ref 0.0–0.2)

## 2020-11-24 MED ORDER — METHOCARBAMOL 500 MG PO TABS
500.0000 mg | ORAL_TABLET | Freq: Four times a day (QID) | ORAL | Status: DC | PRN
  Administered 2020-11-24 – 2020-11-27 (×8): 500 mg via ORAL
  Filled 2020-11-24 (×8): qty 1

## 2020-11-24 MED ORDER — CYCLOBENZAPRINE HCL 5 MG PO TABS
5.0000 mg | ORAL_TABLET | Freq: Once | ORAL | Status: AC | PRN
  Administered 2020-11-24: 5 mg via ORAL
  Filled 2020-11-24: qty 1

## 2020-11-24 MED ORDER — LEVOTHYROXINE SODIUM 75 MCG PO TABS
150.0000 ug | ORAL_TABLET | Freq: Every day | ORAL | Status: DC
  Administered 2020-11-25 – 2020-11-27 (×3): 150 ug via ORAL
  Filled 2020-11-24 (×3): qty 2

## 2020-11-24 MED ORDER — OXYCODONE-ACETAMINOPHEN 10-325 MG PO TABS: 1.0000 | ORAL_TABLET | Freq: Three times a day (TID) | ORAL | Status: DC | PRN

## 2020-11-24 MED ORDER — OXYCODONE HCL 5 MG PO TABS
5.0000 mg | ORAL_TABLET | Freq: Three times a day (TID) | ORAL | Status: DC | PRN
  Administered 2020-11-25 (×2): 5 mg via ORAL
  Filled 2020-11-24 (×2): qty 1

## 2020-11-24 MED ORDER — CALCIUM CARBONATE ANTACID 500 MG PO CHEW
1.0000 | CHEWABLE_TABLET | Freq: Two times a day (BID) | ORAL | Status: DC
  Administered 2020-11-24 – 2020-11-25 (×3): 200 mg via ORAL
  Filled 2020-11-24 (×3): qty 1

## 2020-11-24 MED ORDER — GABAPENTIN 300 MG PO CAPS
300.0000 mg | ORAL_CAPSULE | Freq: Two times a day (BID) | ORAL | Status: AC
  Administered 2020-11-24 (×2): 300 mg via ORAL
  Filled 2020-11-24 (×2): qty 1

## 2020-11-24 MED ORDER — OXYCODONE-ACETAMINOPHEN 5-325 MG PO TABS
1.0000 | ORAL_TABLET | Freq: Three times a day (TID) | ORAL | Status: DC | PRN
  Administered 2020-11-25 (×2): 1 via ORAL
  Filled 2020-11-24 (×2): qty 1

## 2020-11-24 MED ORDER — PRAMIPEXOLE DIHYDROCHLORIDE 1.5 MG PO TABS
1.5000 mg | ORAL_TABLET | Freq: Three times a day (TID) | ORAL | Status: DC
  Administered 2020-11-24 – 2020-11-27 (×10): 1.5 mg via ORAL
  Filled 2020-11-24 (×11): qty 1

## 2020-11-24 NOTE — Progress Notes (Signed)
Remote pacemaker transmission.   

## 2020-11-24 NOTE — Progress Notes (Signed)
Inpatient Diabetes Program Recommendations  AACE/ADA: New Consensus Statement on Inpatient Glycemic Control   Target Ranges:  Prepandial:   less than 140 mg/dL      Peak postprandial:   less than 180 mg/dL (1-2 hours)      Critically ill patients:  140 - 180 mg/dL   Results for EVAN, OSBURN (MRN [id number]) as of 11/24/2020 12:24  Ref. Range 11/23/2020 22:40 11/24/2020 08:23  Glucose-Capillary Latest Ref Range: 70 - 99 mg/dL 91 82  Results for ASUNA, PETH (MRN [id number]) as of 11/24/2020 12:24  Ref. Range 11/23/2020 16:09 11/24/2020 01:48  Glucose Latest Ref Range: 70 - 99 mg/dL 63 (L) 84   Review of Glycemic Control  Diabetes history: DM2 Outpatient Diabetes medications: Glipizide XL 10 mg QAM, Farxiga 10 mg daily, Toujeo 10 units daily (only as needed when glucose is high; not needed at all over 1 month) Current orders for Inpatient glycemic control: Novolog 0-15 units TID with meals, Novolog 0-5 units QHS  Inpatient Diabetes Program Recommendations:    Insulin: May want to consider decreasing Novolog to 0-9 units TID with meals.  CGM: Question if recurrent falls could be related to hypoglycemia. Provider may want to consider ordering FreeStyle Libre2 CGM for patient which would alarm if glucose is less than 70 mg/dl (as long as alarm is on).   NOTE: Spoke with patient over the phone to inquire about DM medications and control. Patient reports that she is taking Glipizide XL 10 mg QAM and Farxiga 10 mg daily for DM control. Patient reports that she has Toujeo but only takes it if her glucose is high; she states she has not needed to take Toujeo in over 1 month. Patient reports that her glucose is usually 120-150 mg/dl and she denies any issues with hypoglycemia.  Inquired about any symptoms of hypoglycemia yesterday when glucose was 63 mg/dl and she states that she did not have any symptoms of hypoglycemia yesterday at all. Patient reports that she is usually able to tell when she is  low and gets symptoms of being shaky and sweaty. Questioned patient to ask if she may be having hypoglycemia which could be leading to falls but patient states that she does not think so as she has no symptoms of hypoglycemia when she has fallen recently. Discussed glucose today 84 and 82 so far and no insulin or oral DM medications given since admitted. Patient verbalized understanding of information discussed.   Will continue to follow along while inpatient.  Thanks, Orlando Penner, RN, MSN, CDE Diabetes Coordinator Inpatient Diabetes Program [phone] (Team Pager from 8am to 5pm)

## 2020-11-24 NOTE — Progress Notes (Signed)
Mobility Specialist Progress Note:   11/24/20 1030  Mobility  Activity Transferred:  Bed to chair  Level of Assistance Moderate assist, patient does 50-74%  Assistive Device Other (Comment) (HHA)  Distance Ambulated (ft) 2 ft  Mobility Out of bed to chair with meals  Mobility Response Tolerated poorly  Mobility performed by Mobility specialist  Bed Position Chair  $Mobility charge 1 Mobility   Pt c/o significant pain during pivot transfer to chair. Pain is in R hip, could not get comfortable in chair. Eating breakfast. Be back after breakfast to transfer back to bed.   Addison Lank Mobility Specialist  Phone [phone]

## 2020-11-24 NOTE — Progress Notes (Addendum)
PROGRESS NOTE  Jillian Leblanc    DOB: [birth year], 75 y.o.  [id number]  PCP: Guadlupe Spanish, MD   Code Status: Full Code   DOA: 11/22/2020   LOS: 1  Brief Narrative of Current Hospitalization  Jillian Leblanc is a 75 y.o. female with a PMH significant for hypothyroidism, type 2 diabetes, SSS, interstitial cystitis, frequent falls. They presented from home to the ED on 11/22/2020 with back pain after fall in driveway. In the ED, it was found that they had acute L5 compression fracture. They were treated with brace and analgesia.  Neurosurgery was consulted and recommended transfer to [facility] for possible procedure.  Patient was admitted to medicine service for further workup and management of chronic conditions as outlined in detail below.  11/24/20 -stable  Assessment & Plan  Active Problems:   AKI (acute kidney injury) ([place])   Syncope  Acute L5 compression fracture-patient's pain is moderately well controlled.  CT of thoracic spine also shows chronic compression deformity of T12 and height loss in T3/T4 -Neurosurgery consulted, appreciate recommendations -Frequent neurochecks -Analgesia as needed - brace in room for activity -Recommend DEXA scan follow-up for probable osteopenia/osteoporosis -Trial of Robaxin and gabapentin for pain control, muscle cramping - PT/OT  AKI-patient's baseline appears to be creatinine around 1.  Improved to 1.6 from 1.9 on admission.  Renal ultrasound yesterday positive for distended urinary bladder as well as mild bilateral hydronephrosis. -Postvoid residual scan and Foley if positive. -Continue IV fluids -Avoid nephrotoxic agents -BMP a.m.  Frequent falls-  - PT/OT evaluation - fall precautions  Hypocalcemia  hypoalbuminemia-improved to 6.6 today (corrects to 7.8).  Albumin 2.5 today -Calcium carbonate twice daily x4  Elevated transaminases-AST elevated to 47 which is improved from 81 at admission.  Normal as recently as 1 month ago.   GGT elevated to 109. -Hepatitis panel a.m. -CMP a.m. -Consider right upper quadrant ultrasound.  Maintaining low suspicion for hepatic disease which could contribute to frequent falls if having encephalopathy.  Acute respiratory failure with hypoxia- improving.  Patient denies oxygen use at baseline.  She also denies any respiratory symptoms at this time.  Chest x-ray did not show any active disease. -Wean oxygen as able  SSS  h/o sinus arrest? Pacemaker seen on chest xray- vital signs stable - monitor vitals per floor protocol   Type 2 diabetes-hemoglobin A1c on admission was 8.4. fasting glucose 82 this am -  sSSI - continue home farxiga, glipizide when kidney function improves  Hypothyroidism-chronic - continue home levothyroxine. Verifying dose with pharmacy as patient has two separate doses listed in home meds.  DVT prophylaxis: SCDs Start: 11/23/20 1637  Diet:  Diet Orders (From admission, onward)     Start     Ordered   11/23/20 1637  Diet Carb Modified Fluid consistency: Thin; Room service appropriate? Yes  Diet effective now       Question Answer Comment  Diet-HS Snack? Nothing   Calorie Level Medium 1600-2000   Fluid consistency: Thin   Room service appropriate? Yes      11/23/20 1641            Subjective 11/24/20    Pt reports moderately well controlled pain while immobile.   Disposition Plan & Communication  Patient status: Inpatient  Admitted From: Home Disposition: Home vs SNF Anticipated discharge date: pending PT/OT evaluation  Family Communication: none  Consults, Procedures, Significant Events  Consultants:  neurosurgery  Procedures/significant events:  None  Antimicrobials:  Anti-infectives (From admission, onward)  None       Objective   Vitals:   11/23/20 1545 11/23/20 1913 11/23/20 2355 11/24/20 0403  BP: 126/61 (!) 116/55 104/72 (!) 116/94  Pulse: 77 79 77 73  Resp: 18 20 18 18   Temp: 98.4 F (36.9 C) 98.5 F (36.9 C)  99.9 F (37.7 C) 98.5 F (36.9 C)  TempSrc: Oral Oral Oral Axillary  SpO2: 100% 99% 98% 100%  Weight:      Height:        Intake/Output Summary (Last 24 hours) at 11/24/2020 0716 Last data filed at 11/24/2020 0408 Gross per 24 hour  Intake 625.49 ml  Output --  Net 625.49 ml   Filed Weights   11/22/20 1846  Weight: 95.3 kg    Patient BMI: Body mass index is 41.01 kg/m.   Physical Exam: General: awake, alert, NAD, laying flat in bed Respiratory: normal respiratory effort. Cardiovascular: quick capillary refill  Nervous: A&O x3. no gross focal neurologic deficits, normal speech Extremities: moves all equally, trace edema, normal tone Skin: dry, intact, normal temperature, normal color, No rashes, lesions or ulcers Psychiatry: normal mood, congruent affect  Labs   I have personally reviewed following labs and imaging studies Admission on 11/22/2020  Component Date Value Ref Range Status   Sodium 11/22/2020 132 (A)  135 - 145 mmol/L Final   Potassium 11/22/2020 3.3 (A)  3.5 - 5.1 mmol/L Final   Chloride 11/22/2020 89 (A)  98 - 111 mmol/L Final   CO2 11/22/2020 25  22 - 32 mmol/L Final   Glucose, Bld 11/22/2020 75  70 - 99 mg/dL Final   BUN 11/22/2020 37 (A)  8 - 23 mg/dL Final   Creatinine, Ser 11/22/2020 1.78 (A)  0.44 - 1.00 mg/dL Final   Calcium 11/22/2020 6.6 (A)  8.9 - 10.3 mg/dL Final   Total Protein 11/22/2020 7.1  6.5 - 8.1 g/dL Final   Albumin 11/22/2020 3.3 (A)  3.5 - 5.0 g/dL Final   AST 11/22/2020 50 (A)  15 - 41 U/L Final   ALT 11/22/2020 41  0 - 44 U/L Final   Alkaline Phosphatase 11/22/2020 182 (A)  38 - 126 U/L Final   Total Bilirubin 11/22/2020 0.8  0.3 - 1.2 mg/dL Final   GFR, Estimated 11/22/2020 30 (A)  >60 mL/min Final   Anion gap 11/22/2020 18 (A)  5 - 15 Final   WBC 11/22/2020 6.4  4.0 - 10.5 K/uL Final   RBC 11/22/2020 3.71 (A)  3.87 - 5.11 MIL/uL Final   Hemoglobin 11/22/2020 11.0 (A)  12.0 - 15.0 g/dL Final   HCT 11/22/2020 32.5 (A)  36.0 -  46.0 % Final   MCV 11/22/2020 87.6  80.0 - 100.0 fL Final   MCH 11/22/2020 29.6  26.0 - 34.0 pg Final   MCHC 11/22/2020 33.8  30.0 - 36.0 g/dL Final   RDW 11/22/2020 14.7  11.5 - 15.5 % Final   Platelets 11/22/2020 243  150 - 400 K/uL Final   nRBC 11/22/2020 0.0  0.0 - 0.2 % Final   Neutrophils Relative % 11/22/2020 78  % Final   Neutro Abs 11/22/2020 5.0  1.7 - 7.7 K/uL Final   Lymphocytes Relative 11/22/2020 13  % Final   Lymphs Abs 11/22/2020 0.8  0.7 - 4.0 K/uL Final   Monocytes Relative 11/22/2020 7  % Final   Monocytes Absolute 11/22/2020 0.4  0.1 - 1.0 K/uL Final   Eosinophils Relative 11/22/2020 1  % Final   Eosinophils Absolute  11/22/2020 0.0  0.0 - 0.5 K/uL Final   Basophils Relative 11/22/2020 0  % Final   Basophils Absolute 11/22/2020 0.0  0.0 - 0.1 K/uL Final   Immature Granulocytes 11/22/2020 1  % Final   Abs Immature Granulocytes 11/22/2020 0.04  0.00 - 0.07 K/uL Final   Color, Urine 11/23/2020 YELLOW  YELLOW Final   APPearance 11/23/2020 CLEAR  CLEAR Final   Specific Gravity, Urine 11/23/2020 1.010  1.005 - 1.030 Final   pH 11/23/2020 5.5  5.0 - 8.0 Final   Glucose, UA 11/23/2020 NEGATIVE  NEGATIVE mg/dL Final   Hgb urine dipstick 11/23/2020 NEGATIVE  NEGATIVE Final   Bilirubin Urine 11/23/2020 NEGATIVE  NEGATIVE Final   Ketones, ur 11/23/2020 NEGATIVE  NEGATIVE mg/dL Final   Protein, ur 11/23/2020 NEGATIVE  NEGATIVE mg/dL Final   Nitrite 11/23/2020 NEGATIVE  NEGATIVE Final   Leukocytes,Ua 11/23/2020 SMALL (A)  NEGATIVE Final   SARS Coronavirus 2 by RT PCR 11/22/2020 NEGATIVE  NEGATIVE Final   Influenza A by PCR 11/22/2020 NEGATIVE  NEGATIVE Final   Influenza B by PCR 11/22/2020 NEGATIVE  NEGATIVE Final   Total CK 11/22/2020 214  38 - 234 U/L Final   Lactic Acid, Venous 11/22/2020 1.1  0.5 - 1.9 mmol/L Final   RBC / HPF 11/23/2020 0-5  0 - 5 RBC/hpf Final   WBC, UA 11/23/2020 6-10  0 - 5 WBC/hpf Final   Bacteria, UA 11/23/2020 FEW (A)  NONE SEEN Final   Squamous  Epithelial / LPF 11/23/2020 0-5  0 - 5 Final   Non Squamous Epithelial 11/23/2020 PRESENT (A)  NONE SEEN Final   Sodium 11/23/2020 132 (A)  135 - 145 mmol/L Final   Potassium 11/23/2020 3.2 (A)  3.5 - 5.1 mmol/L Final   Chloride 11/23/2020 93 (A)  98 - 111 mmol/L Final   CO2 11/23/2020 27  22 - 32 mmol/L Final   Glucose, Bld 11/23/2020 63 (A)  70 - 99 mg/dL Final   BUN 11/23/2020 41 (A)  8 - 23 mg/dL Final   Creatinine, Ser 11/23/2020 1.99 (A)  0.44 - 1.00 mg/dL Final   Calcium 11/23/2020 6.1 (A)  8.9 - 10.3 mg/dL Final   Total Protein 11/23/2020 7.1  6.5 - 8.1 g/dL Final   Albumin 11/23/2020 3.2 (A)  3.5 - 5.0 g/dL Final   AST 11/23/2020 81 (A)  15 - 41 U/L Final   ALT 11/23/2020 44  0 - 44 U/L Final   Alkaline Phosphatase 11/23/2020 207 (A)  38 - 126 U/L Final   Total Bilirubin 11/23/2020 1.4 (A)  0.3 - 1.2 mg/dL Final   GFR, Estimated 11/23/2020 26 (A)  >60 mL/min Final   Anion gap 11/23/2020 12  5 - 15 Final   WBC 11/23/2020 5.7  4.0 - 10.5 K/uL Final   RBC 11/23/2020 3.65 (A)  3.87 - 5.11 MIL/uL Final   Hemoglobin 11/23/2020 10.9 (A)  12.0 - 15.0 g/dL Final   HCT 11/23/2020 32.9 (A)  36.0 - 46.0 % Final   MCV 11/23/2020 90.1  80.0 - 100.0 fL Final   MCH 11/23/2020 29.9  26.0 - 34.0 pg Final   MCHC 11/23/2020 33.1  30.0 - 36.0 g/dL Final   RDW 11/23/2020 14.9  11.5 - 15.5 % Final   Platelets 11/23/2020 224  150 - 400 K/uL Final   nRBC 11/23/2020 0.0  0.0 - 0.2 % Final   Neutrophils Relative % 11/23/2020 73  % Final   Neutro Abs 11/23/2020 4.2  1.7 - 7.7 K/uL Final   Lymphocytes Relative 11/23/2020 17  % Final   Lymphs Abs 11/23/2020 1.0  0.7 - 4.0 K/uL Final   Monocytes Relative 11/23/2020 8  % Final   Monocytes Absolute 11/23/2020 0.5  0.1 - 1.0 K/uL Final   Eosinophils Relative 11/23/2020 1  % Final   Eosinophils Absolute 11/23/2020 0.1  0.0 - 0.5 K/uL Final   Basophils Relative 11/23/2020 0  % Final   Basophils Absolute 11/23/2020 0.0  0.0 - 0.1 K/uL Final   Immature  Granulocytes 11/23/2020 1  % Final   Abs Immature Granulocytes 11/23/2020 0.03  0.00 - 0.07 K/uL Final   Magnesium 11/23/2020 2.0  1.7 - 2.4 mg/dL Final   Hgb I4P MFr Bld 11/23/2020 8.4 (A)  4.8 - 5.6 % Final   Mean Plasma Glucose 11/23/2020 194.38  mg/dL Final   GGT 32/95/1884 109 (A)  7 - 50 U/L Final   Procalcitonin 11/23/2020 0.21  ng/mL Final   Sodium 11/24/2020 136  135 - 145 mmol/L Final   Potassium 11/24/2020 3.7  3.5 - 5.1 mmol/L Final   Chloride 11/24/2020 101  98 - 111 mmol/L Final   CO2 11/24/2020 25  22 - 32 mmol/L Final   Glucose, Bld 11/24/2020 84  70 - 99 mg/dL Final   BUN 16/60/6301 28 (A)  8 - 23 mg/dL Final   Creatinine, Ser 11/24/2020 1.61 (A)  0.44 - 1.00 mg/dL Final   Calcium 60/10/9321 6.6 (A)  8.9 - 10.3 mg/dL Final   Total Protein 55/73/2202 5.8 (A)  6.5 - 8.1 g/dL Final   Albumin 54/27/0623 2.5 (A)  3.5 - 5.0 g/dL Final   AST 76/28/3151 47 (A)  15 - 41 U/L Final   ALT 11/24/2020 36  0 - 44 U/L Final   Alkaline Phosphatase 11/24/2020 164 (A)  38 - 126 U/L Final   Total Bilirubin 11/24/2020 1.0  0.3 - 1.2 mg/dL Final   GFR, Estimated 11/24/2020 34 (A)  >60 mL/min Final   Anion gap 11/24/2020 10  5 - 15 Final   WBC 11/24/2020 5.6  4.0 - 10.5 K/uL Final   RBC 11/24/2020 3.45 (A)  3.87 - 5.11 MIL/uL Final   Hemoglobin 11/24/2020 10.2 (A)  12.0 - 15.0 g/dL Final   HCT 76/16/0737 30.4 (A)  36.0 - 46.0 % Final   MCV 11/24/2020 88.1  80.0 - 100.0 fL Final   MCH 11/24/2020 29.6  26.0 - 34.0 pg Final   MCHC 11/24/2020 33.6  30.0 - 36.0 g/dL Final   RDW 10/62/6948 14.6  11.5 - 15.5 % Final   Platelets 11/24/2020 191  150 - 400 K/uL Final   nRBC 11/24/2020 0.0  0.0 - 0.2 % Final   Glucose-Capillary 11/23/2020 91  70 - 99 mg/dL Final    Imaging Studies  US RENAL  Result Date: 11/23/2020 CLINICAL DATA:  Acute kidney injury. EXAM: RENAL / URINARY TRACT ULTRASOUND COMPLETE COMPARISON:  Report from abdominopelvic CT 06/08/2018, images not available FINDINGS: Right  Kidney: Renal measurements: 9.9 x 5.6 x 5.0 cm = volume: 145 mL. Diffusely increased renal parenchymal echogenicity with cortical thinning. Areas of cortical scarring. There is mild hydronephrosis. No visualized renal stone or focal lesion. Left Kidney: Renal measurements: 10.1 x 6.8 x 5.5 cm = volume: 194 mL. Mild hydronephrosis. Borderline increased renal parenchymal echogenicity. Small cyst on prior CT is not well seen by ultrasound. No visualized stone or suspicious lesion. Bladder: Distended with bladder volume of 1040 cc.  No bladder wall thickening or focal abnormality. Neither ureteral jet is seen. Other: None. IMPRESSION: 1. Distended urinary bladder with bladder volume of 1040 cc. Recommend correlation for bladder outlet obstruction. 2. Mild bilateral hydronephrosis may be secondary to bladder distension. 3. Increased right renal parenchyma echogenicity and areas of cortical scarring. Minimally increased left renal parenchymal echogenicity. Electronically Signed   By: Keith Rake M.D.   On: 11/23/2020 20:28   Medications   Scheduled Meds:  insulin aspart  0-15 Units Subcutaneous TID WC   insulin aspart  0-5 Units Subcutaneous QHS   No recently discontinued medications to reconcile  LOS: 1 day   Time spent: >17min  Dshawn Mcnay L Lonny Eisen, DO [facility] 11/24/2020, 7:16 AM   Please refer to amion to contact the [facility] Attending or Consulting provider for this pt  [email or web address] Available by Epic secure chat 7AM-7PM. If 7PM-7AM, please contact night-coverage

## 2020-11-24 NOTE — Progress Notes (Signed)
Mobility Specialist Progress Note:   11/24/20 1145  Mobility  Activity Transferred:  Chair to bed  Level of Assistance Minimal assist, patient does 75% or more  Assistive Device Other (Comment) (HHA)  Distance Ambulated (ft) 4 ft  Mobility Out of bed for toileting;Out of bed to chair with meals  Mobility Response Tolerated well  Mobility performed by Mobility specialist  $Mobility charge 1 Mobility   Pt tolerated much better after pain meds. Pt transferred to [facility] before back to bed.   Addison Lank Mobility Specialist  Phone [phone]

## 2020-11-25 ENCOUNTER — Inpatient Hospital Stay (HOSPITAL_COMMUNITY): Payer: Medicare Other

## 2020-11-25 LAB — CBC
HCT: 30.3 % — ABNORMAL LOW (ref 36.0–46.0)
Hemoglobin: 9.8 g/dL — ABNORMAL LOW (ref 12.0–15.0)
MCH: 29.5 pg (ref 26.0–34.0)
MCHC: 32.3 g/dL (ref 30.0–36.0)
MCV: 91.3 fL (ref 80.0–100.0)
Platelets: 216 10*3/uL (ref 150–400)
RBC: 3.32 MIL/uL — ABNORMAL LOW (ref 3.87–5.11)
RDW: 14.7 % (ref 11.5–15.5)
WBC: 6.1 10*3/uL (ref 4.0–10.5)
nRBC: 0 % (ref 0.0–0.2)

## 2020-11-25 LAB — COMPREHENSIVE METABOLIC PANEL
ALT: 29 U/L (ref 0–44)
AST: 29 U/L (ref 15–41)
Albumin: 2.5 g/dL — ABNORMAL LOW (ref 3.5–5.0)
Alkaline Phosphatase: 147 U/L — ABNORMAL HIGH (ref 38–126)
Anion gap: 9 (ref 5–15)
BUN: 19 mg/dL (ref 8–23)
CO2: 25 mmol/L (ref 22–32)
Calcium: 7.2 mg/dL — ABNORMAL LOW (ref 8.9–10.3)
Chloride: 102 mmol/L (ref 98–111)
Creatinine, Ser: 1.04 mg/dL — ABNORMAL HIGH (ref 0.44–1.00)
GFR, Estimated: 57 mL/min — ABNORMAL LOW (ref >60)
Glucose, Bld: 75 mg/dL (ref 70–99)
Potassium: 3.5 mmol/L (ref 3.5–5.1)
Sodium: 136 mmol/L (ref 135–145)
Total Bilirubin: 1.1 mg/dL (ref 0.3–1.2)
Total Protein: 5.9 g/dL — ABNORMAL LOW (ref 6.5–8.1)

## 2020-11-25 LAB — HEPATITIS PANEL, ACUTE
HCV Ab: NONREACTIVE
Hep A IgM: NONREACTIVE
Hep B C IgM: NONREACTIVE
Hepatitis B Surface Ag: NONREACTIVE

## 2020-11-25 LAB — GLUCOSE, CAPILLARY
Glucose-Capillary: 67 mg/dL — ABNORMAL LOW (ref 70–99)
Glucose-Capillary: 96 mg/dL (ref 70–99)
Glucose-Capillary: 87 mg/dL (ref 70–99)
Glucose-Capillary: 103 mg/dL — ABNORMAL HIGH (ref 70–99)
Glucose-Capillary: 215 mg/dL — ABNORMAL HIGH (ref 70–99)

## 2020-11-25 MED ORDER — OXYCODONE-ACETAMINOPHEN 5-325 MG PO TABS
1.0000 | ORAL_TABLET | Freq: Four times a day (QID) | ORAL | Status: DC | PRN
  Administered 2020-11-25 – 2020-11-26 (×3): 1 via ORAL
  Filled 2020-11-25 (×3): qty 1

## 2020-11-25 MED ORDER — OXYCODONE HCL 5 MG PO TABS
5.0000 mg | ORAL_TABLET | Freq: Four times a day (QID) | ORAL | Status: DC | PRN
  Administered 2020-11-25 – 2020-11-26 (×3): 5 mg via ORAL
  Filled 2020-11-25 (×3): qty 1

## 2020-11-25 MED ORDER — CALCIUM CARBONATE ANTACID 500 MG PO CHEW
1.0000 | CHEWABLE_TABLET | Freq: Two times a day (BID) | ORAL | Status: DC
  Administered 2020-11-25 – 2020-11-27 (×4): 200 mg via ORAL
  Filled 2020-11-25 (×4): qty 1

## 2020-11-25 MED ORDER — KETOROLAC TROMETHAMINE 15 MG/ML IJ SOLN
15.0000 mg | Freq: Once | INTRAMUSCULAR | Status: AC
Start: 2020-11-25 — End: 2020-11-25
  Administered 2020-11-25: 15 mg via INTRAVENOUS
  Filled 2020-11-25: qty 1

## 2020-11-25 MED ORDER — FENTANYL CITRATE PF 50 MCG/ML IJ SOSY
50.0000 ug | PREFILLED_SYRINGE | INTRAMUSCULAR | Status: DC | PRN
  Administered 2020-11-25 – 2020-11-26 (×3): 50 ug via INTRAVENOUS
  Filled 2020-11-25 (×3): qty 1

## 2020-11-25 MED ORDER — ENSURE ENLIVE PO LIQD
237.0000 mL | Freq: Two times a day (BID) | ORAL | Status: DC
  Administered 2020-11-25 – 2020-11-27 (×4): 237 mL via ORAL

## 2020-11-25 MED ORDER — WHITE PETROLATUM EX OINT
TOPICAL_OINTMENT | CUTANEOUS | Status: AC
  Filled 2020-11-25: qty 28.35

## 2020-11-25 MED ORDER — POLYETHYLENE GLYCOL 3350 17 G PO PACK
17.0000 g | PACK | Freq: Two times a day (BID) | ORAL | Status: DC
  Administered 2020-11-25 – 2020-11-27 (×5): 17 g via ORAL
  Filled 2020-11-25 (×5): qty 1

## 2020-11-25 MED ORDER — GABAPENTIN 300 MG PO CAPS
300.0000 mg | ORAL_CAPSULE | Freq: Two times a day (BID) | ORAL | Status: DC
  Administered 2020-11-25 – 2020-11-27 (×5): 300 mg via ORAL
  Filled 2020-11-25 (×5): qty 1

## 2020-11-25 MED ORDER — INSULIN ASPART 100 UNIT/ML IJ SOLN
0.0000 [IU] | Freq: Three times a day (TID) | INTRAMUSCULAR | Status: DC
  Administered 2020-11-26 (×3): 1 [IU] via SUBCUTANEOUS
  Administered 2020-11-27: 2 [IU] via SUBCUTANEOUS

## 2020-11-25 NOTE — Progress Notes (Signed)
Mobility Specialist Progress Note:   11/25/20 1600  Mobility  Activity Transferred:  Chair to bed  Level of Assistance Minimal assist, patient does 75% or more  Assistive Device Other (Comment) (HHA)  Distance Ambulated (ft) 2 ft  Mobility Ambulated with assistance in room  Mobility Response Tolerated well  Mobility performed by Mobility specialist  $Mobility charge 1 Mobility   Pt pivot transferred to bed. MS deferred ambulation d/t not having brace. Will f/u tomorrow for hopeful ambulation with brace on.   Addison Lank Mobility Specialist  Phone [phone]

## 2020-11-25 NOTE — Progress Notes (Addendum)
Inpatient Diabetes Program Recommendations  AACE/ADA: New Consensus Statement on Inpatient Glycemic Control   Target Ranges:  Prepandial:   less than 140 mg/dL      Peak postprandial:   less than 180 mg/dL (1-2 hours)      Critically ill patients:  140 - 180 mg/dL   Results for PETRA, DUMLER (MRN [id number]) as of 11/25/2020 11:33  Ref. Range 11/24/2020 08:23 11/24/2020 12:56 11/24/2020 16:03 11/24/2020 20:22 11/25/2020 08:28 11/25/2020 10:55  Glucose-Capillary Latest Ref Range: 70 - 99 mg/dL 82 914 (H)  Novolog 3 units 72 80 67 (L) 96   Review of Glycemic Control  Diabetes history: DM2 Outpatient Diabetes medications: Glipizide XL 10 mg QAM, Farxiga 10 mg daily, Toujeo 10 units daily (only as needed when glucose is high; not needed at all over 1 month) Current orders for Inpatient glycemic control: Novolog 0-15 units TID with meals, Novolog 0-5 units QHS   Inpatient Diabetes Program Recommendations:     Insulin: May want to consider decreasing Novolog to 0-9 units TID with meals.   CGM: Question if recurrent falls could be related to hypoglycemia. Provider may want to consider ordering FreeStyle Libre2 CGM for patient which would alarm if glucose is less than 70 mg/dl (as long as alarm is on).   Outpatient DM medications: Discharging provider may want to consider adjusting outpatient DM medications at discharge given low glucose trends as an inpatient on no oral DM meds and very little Novolog correction.  Addendum 11/25/20@12 :04-Spoke with patient over the phone. Inquired if patient had any symptoms this morning when glucose was 67 and patient states that she did not have any symptoms of hypoglycemia this morning. Discussed concerns that she may have hypoglycemia unawareness given she does not have symptoms when glucose is low. Inquired about knowledge of FreeStyle Libre CGM and patient states that she was prescribed FreeStyle Libre CGM by per PCP and she received FreeStyle Libre in the mail  last week. She states that her daughter helped her to put the [facility] on but it bruised her arm bad so she removed it. [name] not sure if the FreeStyle [place] she had was the original 14 day or Libre2. Discussed features of the FreeStyle Libre2 in that it has alarms that can be used to alarm if glucose less than 70 mg/dl. Patient states that she would be interested in using the [employer] and she would be willing to try it again. Informed patient that our team has some samples of the FreeStyle Libre2 but we would need a provider order to give her a sample and to assist with applying one prior to discharge. Informed patient that I would let attending provider know. Patient verbalized understanding of information discussed and she states she has no questions at this time.  Thanks, Orlando Penner, RN, MSN, CDE Diabetes Coordinator Inpatient Diabetes Program [phone] (Team Pager from 8am to 5pm)

## 2020-11-25 NOTE — Evaluation (Signed)
Physical Therapy Evaluation Patient Details Name: Jillian Leblanc MRN: [id number] DOB: 1945/02/12 Today's Date: 11/25/2020  History of Present Illness  Pt is 75 yo female presenting with falls. CT Lumbar spine showed acute L5 compression fracture. She was noted to have AKI. She was noted to be hypoxic on RA.medical history significant of hypothyroidism, DM2, SSS, interstitial cystitis.  Clinical Impression  Pt admitted secondary to problem above with deficits below. Pt requiring mod A to perform bed mobility tasks. Pt with increased pain and did not have brace in room so further mobility deferred. Notified RN that pt did not have brace. Feel pt would benefit from SNF level therapies, however, will likely refuse. Will need to ensure family assist at home and max [facility] services at d/c should pt refuse SNF. Will continue to follow acutely.        Recommendations for follow up therapy are one component of a multi-disciplinary discharge planning process, led by the attending physician.  Recommendations may be updated based on patient status, additional functional criteria and insurance authorization.  Follow Up Recommendations Skilled nursing-short term rehab (<3 hours/day)    Assistance Recommended at Discharge Frequent or constant Supervision/Assistance  Functional Status Assessment Patient has had a recent decline in their functional status and demonstrates the ability to make significant improvements in function in a reasonable and predictable amount of time.  Equipment Recommendations  Wheelchair (measurements PT);Wheelchair cushion (measurements PT)    Recommendations for Other Services       Precautions / Restrictions Precautions Precautions: Back;Fall Precaution Comments: possible surgery Required Braces or Orthoses: Spinal Brace Spinal Brace:  (per notes, should have a brace, but does not in room; RN notified) Restrictions Weight Bearing Restrictions: No      Mobility  Bed  Mobility Overal bed mobility: Needs Assistance Bed Mobility: Rolling;Sidelying to Sit;Sit to Supine Rolling: Mod assist Sidelying to sit: Mod assist   Sit to supine: Mod assist   General bed mobility comments: cues for log roll technique. Required assist for trunk and LE assist. Pt reports increased pain and didn't have brace in the room so further mobility deferred.    Transfers Overall transfer level: Needs assistance Equipment used: Rolling walker (2 wheels) Transfers: Sit to/from Stand Sit to Stand: Mod assist           General transfer comment: to/from EOB. assist to powerup and steady    Ambulation/Gait                Stairs            Wheelchair Mobility    Modified Rankin (Stroke Patients Only)       Balance Overall balance assessment: History of Falls;Needs assistance Sitting-balance support: Feet supported;Single extremity supported;Bilateral upper extremity supported Sitting balance-Leahy Scale: Fair     Standing balance support: Bilateral upper extremity supported Standing balance-Leahy Scale: Poor                               Pertinent Vitals/Pain Pain Assessment: Faces Pain Score: 7  Faces Pain Scale: Hurts whole lot Pain Location: back Pain Descriptors / Indicators: Grimacing;Moaning;Guarding Pain Intervention(s): Limited activity within patient's tolerance;Monitored during session;Repositioned    Home Living Family/patient expects to be discharged to:: Private residence Living Arrangements: Spouse/significant other Available Help at Discharge: Family Type of Home: House Home Access: Stairs to enter Entrance Stairs-Rails: Right Entrance Stairs-Number of Steps: 2   Home Layout: Two level;Full bath on main  level;Able to live on main level with bedroom/bathroom Home Equipment: Rolling Walker (2 wheels);Cane - quad;Grab bars - toilet;Grab bars - tub/shower      Prior Function Prior Level of Function : History of  Falls (last six months)             Mobility Comments: h/o falls. uses RW for ambulation       Hand Dominance   Dominant Hand: Right    Extremity/Trunk Assessment   Upper Extremity Assessment Upper Extremity Assessment: Defer to OT evaluation    Lower Extremity Assessment Lower Extremity Assessment: Generalized weakness;RLE deficits/detail RLE Deficits / Details: R hip pain    Cervical / Trunk Assessment Cervical / Trunk Assessment: Other exceptions (CT Lumbar spine showed acute L5 compression fracture.)  Communication   Communication: No difficulties  Cognition Arousal/Alertness: Awake/alert Behavior During Therapy: WFL for tasks assessed/performed;Anxious Overall Cognitive Status: No family/caregiver present to determine baseline cognitive functioning                                 General Comments: When entering the room, pt asked "where am I?" and "why am I not in the room?" PT ensured her she was in her room. Pt also did not remember what she did in her OT session. RN notified.        General Comments      Exercises     Assessment/Plan    PT Assessment Patient needs continued PT services  PT Problem List Decreased strength;Decreased activity tolerance;Decreased balance;Decreased mobility;Decreased knowledge of use of DME;Decreased knowledge of precautions;Pain       PT Treatment Interventions DME instruction;Gait training;Functional mobility training;Therapeutic activities;Therapeutic exercise;Stair training;Balance training;Patient/family education    PT Goals (Current goals can be found in the Care Plan section)  Acute Rehab PT Goals Patient Stated Goal: to decrease pain PT Goal Formulation: With patient Time For Goal Achievement: 12/09/20 Potential to Achieve Goals: Good    Frequency Min 3X/week   Barriers to discharge        Co-evaluation               AM-PAC PT "6 Clicks" Mobility  Outcome Measure Help needed  turning from your back to your side while in a flat bed without using bedrails?: A Lot Help needed moving from lying on your back to sitting on the side of a flat bed without using bedrails?: A Lot Help needed moving to and from a bed to a chair (including a wheelchair)?: A Lot Help needed standing up from a chair using your arms (e.g., wheelchair or bedside chair)?: A Lot Help needed to walk in hospital room?: A Lot Help needed climbing 3-5 steps with a railing? : Total 6 Click Score: 11    End of Session   Activity Tolerance: Patient limited by pain Patient left: in bed;with call bell/phone within reach;with bed alarm set Nurse Communication: Mobility status;Other (comment) (increased confusion) PT Visit Diagnosis: Unsteadiness on feet (R26.81);Muscle weakness (generalized) (M62.81);Difficulty in walking, not elsewhere classified (R26.2);Pain Pain - Right/Left: Right Pain - part of body: Hip (back)    Time: 2353-6144 PT Time Calculation (min) (ACUTE ONLY): 15 min   Charges:   PT Evaluation $PT Eval Moderate Complexity: 1 Mod          Farley Ly, PT, DPT  Acute Rehabilitation Services  Pager: [phone] Office: [phone]   Lehman Prom 11/25/2020, 3:26 PM

## 2020-11-25 NOTE — Progress Notes (Signed)
PROGRESS NOTE    Jillian Leblanc  PPI:[id number] DOB: 06-20-1945 DOA: 11/22/2020 PCP: Karle Plumber, MD   Brief Narrative: 75 year old with past medical history significant for hypothyroidism, type 2 diabetes, sick sinus syndrome, interstitial cystitis, frequent fall.  Patient presented from home to the ED on 10/30 with back pain after falling in her driveway.  In the ED she was found to have acute L5 compression fracture.  She was placed on brace and started on pain medication.  Neurosurgery was consulted and recommended transfer patient to [facility] for possible procedure.  I have Contacted Dr. Johnsie Cancel today, he will see patient in consultation today.   Assessment & Plan:   Active Problems:   AKI (acute kidney injury) (HCC)   Syncope   Fall   Compression of lumbar vertebra (HCC)   Type 2 diabetes mellitus without complication, with long-term current use of insulin (HCC)   Elevated transaminase level   Hypothyroidism  1-Acute L5 compression fracture: CT thoracic spine also showed deformity of T12 and height loss T3-T4 -[name] still having significant back pain. -Plan to change oxycodone to every 6 hours as needed. -Have order one-time dose of Toradol. -Dr. Johnsie Cancel consulted. -Continue with gabapentin.   2-Hip pain: X-ray on admission negative.  Patient complaining of left hip pain, limping.  Plan to proceed with CT left hip.  3-AKI: Patient presented with a creatinine of 1.9.  Renal ultrasound show distended urinary urinary bladder and mild bilateral hydronephrosis. -Patient unable to urinate this morning, Foley catheter placed 11/2 -Continue with IV fluids. -Renal function has improved.  4-frequent fall: PT OT consulted. Benefit from skilled rehab. 2015 having hypoglycemia.  Hypocalcemia hypoalbuminemia: Continue calcium supplements  Transaminases: Resolved.  Hepatitis panel negative. Mild elevation of alkaline phosphatase.  Follow trend Hypoxia;  resolved. Chest x-ray did not show any active disease.  Wean off of oxygen as possible.  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   Weight as of this encounter: 95.3 kg.   DVT prophylaxis: SCDs, if no plan for surgery will start Lovenox Code Status: Full code Family Communication: Care discussed with patient Disposition Plan:  Status is: Inpatient  Remains inpatient appropriate because: Patient found to have compression fracture, still complaining of back pain, awaiting neurosurgery evaluation.  Pain uncontrolled.  Requiring IV pain medication        Consultants:  Dr Maurice Small  Procedures:  None  Antimicrobials:  none  Subjective: She is very uncomfortable.  She is having left hip pain.  She is also complaining of back pain.  Yesterday when she stands up she was having  tingling pain in her feet.  Objective: Vitals:   11/24/20 1500 11/24/20 2024 11/25/20 0517 11/25/20 0832  BP: (!) 130/50 (!) 130/54 (!) 111/47 134/70  Pulse: 70 98 78 67  Resp: 16 18 18 18   Temp: 98.3 F (36.8 C) 98.5 F (36.9 C) 98.8 F (37.1 C) 98.1 F (36.7 C)  TempSrc: Oral Oral Oral Oral  SpO2: 99% 93% 95% 97%  Weight:      Height:        Intake/Output Summary (Last 24 hours) at 11/25/2020 1408 Last data filed  at 11/25/2020 1300 Gross per 24 hour  Intake 557 ml  Output 1750 ml  Net -1193 ml   Filed Weights   11/22/20 1846  Weight: 95.3 kg    Examination:  General exam: Appears calm and comfortable  Respiratory system: Clear to auscultation. Respiratory effort normal. Cardiovascular system: S1 & S2 heard, RRR. No JVD, murmurs, rubs,  gallops or clicks. No pedal edema. Gastrointestinal system: Abdomen is nondistended, soft and nontender. No organomegaly or masses felt. Normal bowel sounds heard. Central nervous system: Alert and oriented. No focal neurological deficits. Extremities: Symmetric 5 x 5 power.   Data Reviewed: I have personally reviewed following labs and imaging studies  CBC: Recent Labs  Lab 11/22/20 1932 11/23/20 1609 11/24/20 0148 11/25/20 0154  WBC 6.4 5.7 5.6 6.1  NEUTROABS 5.0 4.2  --   --   HGB 11.0* 10.9* 10.2* 9.8*  HCT 32.5* 32.9* 30.4* 30.3*  MCV 87.6 90.1 88.1 91.3  PLT 243 224 191 [id number]   Basic Metabolic Panel: Recent Labs  Lab 11/22/20 1932 11/23/20 1609 11/24/20 0148 11/25/20 0154  NA 132* 132* 136 136  K 3.3* 3.2* 3.7 3.5  CL 89* 93* 101 102  CO2 25 27 25 25   GLUCOSE 75 63* 84 75  BUN 37* 41* 28* 19  CREATININE 1.78* 1.99* 1.61* 1.04*  CALCIUM 6.6* 6.1* 6.6* 7.2*  MG  --  2.0  --   --    GFR: Estimated Creatinine Clearance: 49.7 mL/min (A) (by C-G formula based on SCr of 1.04 mg/dL (H)). Liver Function Tests: Recent Labs  Lab 11/22/20 1932 11/23/20 1609 11/24/20 0148 11/25/20 0154  AST 50* 81* 47* 29  ALT 41 44 36 29  ALKPHOS 182* 207* 164* 147*  BILITOT 0.8 1.4* 1.0 1.1  PROT 7.1 7.1 5.8* 5.9*  ALBUMIN 3.3* 3.2* 2.5* 2.5*   No results for input(s): LIPASE, AMYLASE in the last 168 hours. No results for input(s): AMMONIA in the last 168 hours. Coagulation Profile: No results for input(s): INR, PROTIME in the last 168 hours. Cardiac Enzymes: Recent Labs  Lab 11/22/20 2249  CKTOTAL 214   BNP (last 3 results) No results for input(s): PROBNP in the last 8760 hours. HbA1C: Recent Labs    11/23/20 1733  HGBA1C 8.4*   CBG: Recent Labs  Lab 11/24/20 1603 11/24/20 2022 11/25/20 0828 11/25/20 1055 11/25/20 1203  GLUCAP 72 80 67* 96 87   Lipid Profile: No results for input(s): CHOL, HDL, LDLCALC, TRIG, CHOLHDL, LDLDIRECT in the last 72  hours. Thyroid Function Tests: No results for input(s): TSH, T4TOTAL, FREET4, T3FREE, THYROIDAB in the last 72 hours. Anemia Panel: No results for input(s): VITAMINB12, FOLATE, FERRITIN, TIBC, IRON, RETICCTPCT in the last 72 hours. Sepsis Labs: Recent Labs  Lab 11/22/20 2249 11/23/20 1733  PROCALCITON  --  0.21  LATICACIDVEN 1.1  --     Recent Results (from the past 240 hour(s))  Resp Panel by RT-PCR (Flu A&B, Covid) Nasopharyngeal Swab     Status: None   Collection Time: 11/22/20  8:56 PM   Specimen: Nasopharyngeal Swab; Nasopharyngeal(NP) swabs in vial transport medium  Result Value Ref Range Status   SARS Coronavirus 2 by RT PCR NEGATIVE NEGATIVE Final    Comment: (NOTE) SARS-CoV-2 target nucleic acids are NOT DETECTED.  The SARS-CoV-2 RNA is generally detectable in upper respiratory specimens during the acute phase of infection. The lowest concentration of SARS-CoV-2 viral copies this assay can detect is 138 copies/mL. A negative result does not  preclude SARS-Cov-2 infection and should not be used as the sole basis for treatment or other patient management decisions. A negative result may occur with  improper specimen collection/handling, submission of specimen other than nasopharyngeal swab, presence of viral mutation(s) within the areas targeted by this assay, and inadequate number of viral copies(<138 copies/mL). A negative result must be combined with clinical observations, patient history, and epidemiological information. The expected result is Negative.  Fact Sheet for Patients:  EntrepreneurPulse.com.au  Fact Sheet for Healthcare Providers:  IncredibleEmployment.be  This test is no t yet approved or cleared by the [place] FDA and  has been authorized for detection and/or diagnosis of SARS-CoV-2 by FDA under an Emergency Use Authorization (EUA). This EUA will remain  in effect (meaning this test can be used) for the  duration of the COVID-19 declaration under Section 564(b)(1) of the Act, 21 U.S.C.section 360bbb-3(b)(1), unless the authorization is terminated  or revoked sooner.       Influenza A by PCR NEGATIVE NEGATIVE Final   Influenza B by PCR NEGATIVE NEGATIVE Final    Comment: (NOTE) The Xpert Xpress SARS-CoV-2/FLU/RSV plus assay is intended as an aid in the diagnosis of influenza from Nasopharyngeal swab specimens and should not be used as a sole basis for treatment. Nasal washings and aspirates are unacceptable for Xpert Xpress SARS-CoV-2/FLU/RSV testing.  Fact Sheet for Patients: EntrepreneurPulse.com.au  Fact Sheet for Healthcare Providers: IncredibleEmployment.be  This test is not yet approved or cleared by the [place] FDA and has been authorized for detection and/or diagnosis of SARS-CoV-2 by FDA under an Emergency Use Authorization (EUA). This EUA will remain in effect (meaning this test can be used) for the duration of the COVID-19 declaration under Section 564(b)(1) of the Act, 21 U.S.C. section 360bbb-3(b)(1), unless the authorization is terminated or revoked.  Performed at [facility], [address] [place], [place] [address]          Radiology Studies: US RENAL  Result Date: 11/23/2020 CLINICAL DATA:  Acute kidney injury. EXAM: RENAL / URINARY TRACT ULTRASOUND COMPLETE COMPARISON:  Report from abdominopelvic CT 06/08/2018, images not available FINDINGS: Right Kidney: Renal measurements: 9.9 x 5.6 x 5.0 cm = volume: 145 mL. Diffusely increased renal parenchymal echogenicity with cortical thinning. Areas of cortical scarring. There is mild hydronephrosis. No visualized renal stone or focal lesion. Left Kidney: Renal measurements: 10.1 x 6.8 x 5.5 cm = volume: 194 mL. Mild hydronephrosis. Borderline increased renal parenchymal echogenicity. Small cyst on prior CT is not well seen by ultrasound. No visualized stone or  suspicious lesion. Bladder: Distended with bladder volume of 1040 cc. No bladder wall thickening or focal abnormality. Neither ureteral jet is seen. Other: None. IMPRESSION: 1. Distended urinary bladder with bladder volume of 1040 cc. Recommend correlation for bladder outlet obstruction. 2. Mild bilateral hydronephrosis may be secondary to bladder distension. 3. Increased right renal parenchyma echogenicity and areas of cortical scarring. Minimally increased left renal parenchymal echogenicity. Electronically Signed   By: Keith Rake M.D.   On: 11/23/2020 20:28   ECHOCARDIOGRAM COMPLETE  Result Date: 11/24/2020    ECHOCARDIOGRAM REPORT   Patient Name:   Jillian Leblanc Date of Exam: 11/24/2020 Medical Rec #:  [id number]      Height:       60.0 in Accession #:    [id number]     Weight:       210.0 lb Date of Birth:  [birth year]      BSA:  1.906 m Patient Age:    52 years       BP:           104/72 mmHg Patient Gender: F              HR:           83 bpm. Exam Location:  Inpatient Procedure: 2D Echo, Cardiac Doppler and Color Doppler Indications:    Syncope  History:        Patient has prior history of Echocardiogram examinations.  Sonographer:    Jyl Heinz Referring Phys: [id number] [place]  1. Left ventricular ejection fraction, by estimation, is 60 to 65%. The left ventricle has normal function. The left ventricle has no regional wall motion abnormalities. Left ventricular diastolic parameters are consistent with Grade I diastolic dysfunction (impaired relaxation). Elevated left atrial pressure.  2. Right ventricular systolic function is normal. The right ventricular size is normal.  3. No evidence of mitral valve regurgitation.  4. The aortic valve is tricuspid. Aortic valve regurgitation is not visualized. Mild aortic valve sclerosis is present, with no evidence of aortic valve stenosis. FINDINGS  Left Ventricle: Left ventricular ejection fraction, by estimation, is 60 to 65%. The  left ventricle has normal function. The left ventricle has no regional wall motion abnormalities. The left ventricular internal cavity size was normal in size. There is  no left ventricular hypertrophy. Left ventricular diastolic parameters are consistent with Grade I diastolic dysfunction (impaired relaxation). Elevated left atrial pressure. Right Ventricle: The right ventricular size is normal. No increase in right ventricular wall thickness. Right ventricular systolic function is normal. Left Atrium: Left atrial size was normal in size. Right Atrium: Right atrial size was normal in size. Pericardium: There is no evidence of pericardial effusion. Mitral Valve: Mild mitral annular calcification. No evidence of mitral valve regurgitation. MV peak gradient, 12.2 mmHg. The mean mitral valve gradient is 5.0 mmHg. Tricuspid Valve: The tricuspid valve is normal in structure. Tricuspid valve regurgitation is trivial. Aortic Valve: The aortic valve is tricuspid. Aortic valve regurgitation is not visualized. Mild aortic valve sclerosis is present, with no evidence of aortic valve stenosis. Aortic valve peak gradient measures 13.6 mmHg. Pulmonic Valve: The pulmonic valve was normal in structure. Pulmonic valve regurgitation is not visualized. Aorta: The aortic root and ascending aorta are structurally normal, with no evidence of dilitation. IAS/Shunts: No atrial level shunt detected by color flow Doppler. Additional Comments: A device lead is visualized.  LEFT VENTRICLE PLAX 2D LVIDd:         3.95 cm      Diastology LVIDs:         2.51 cm      LV e' medial:    7.94 cm/s LV PW:         1.09 cm      LV E/e' medial:  17.8 LV IVS:        1.07 cm      LV e' lateral:   7.62 cm/s LVOT diam:     2.00 cm      LV E/e' lateral: 18.5 LV SV:         99 LV SV Index:   52 LVOT Area:     3.14 cm  LV Volumes (MOD) LV vol d, MOD A2C: 123.0 ml LV vol d, MOD A4C: 101.0 ml LV vol s, MOD A2C: 44.4 ml LV vol s, MOD A4C: 37.2 ml LV SV MOD A2C:  78.6 ml LV SV MOD A4C:     101.0 ml LV SV MOD BP:      71.1 ml RIGHT VENTRICLE RV Basal diam:  3.33 cm RV Mid diam:    3.30 cm RV S prime:     15.40 cm/s TAPSE (M-mode): 2.0 cm LEFT ATRIUM             Index        RIGHT ATRIUM           Index LA diam:        4.20 cm 2.20 cm/m   RA Area:     12.10 cm 6.35 cm/m LA Vol (A2C):   59.7 ml 31.33 ml/m LA Vol (A4C):   47.4 ml 24.87 ml/m LA Biplane Vol: 54.4 ml 28.55 ml/m  AORTIC VALVE AV Area (Vmax): 2.33 cm AV Vmax:        184.50 cm/s AV Peak Grad:   13.6 mmHg LVOT Vmax:      137.00 cm/s LVOT Vmean:     107.000 cm/s LVOT VTI:       0.314 m  AORTA Ao Root diam: 2.80 cm Ao Asc diam:  3.50 cm MITRAL VALVE                TRICUSPID VALVE MV Area (PHT): 3.65 cm     TR Peak grad:   38.7 mmHg MV Area VTI:   2.34 cm     TR Vmax:        311.00 cm/s MV Peak grad:  12.2 mmHg MV Mean grad:  5.0 mmHg     SHUNTS MV Vmax:       1.75 m/s     Systemic VTI:  0.31 m MV Vmean:      104.0 cm/s   Systemic Diam: 2.00 cm MV Decel Time: 208 msec MV E velocity: 141.00 cm/s MV A velocity: 161.00 cm/s MV E/A ratio:  0.88 Dorris Carnes MD Electronically signed by Dorris Carnes MD Signature Date/Time: 11/24/2020/1:38:22 PM    Final         Scheduled Meds:  calcium carbonate  1 tablet Oral BID   insulin aspart  0-9 Units Subcutaneous TID WC   levothyroxine  150 mcg Oral QAC breakfast   polyethylene glycol  17 g Oral BID   pramipexole  1.5 mg Oral TID   Continuous Infusions:   LOS: 2 days    Time spent: 35 minutes.     Elmarie Shiley, MD [facility]   If 7PM-7AM, please contact night-coverage [email or web address]  11/25/2020, 2:08 PM

## 2020-11-25 NOTE — Plan of Care (Signed)
  Problem: Education: Goal: Knowledge of General Education information will improve Description: Including pain rating scale, medication(s)/side effects and non-pharmacologic comfort measures Outcome: Progressing   Problem: Clinical Measurements: Goal: Cardiovascular complication will be avoided Outcome: Progressing   Problem: Activity: Goal: Risk for activity intolerance will decrease Outcome: Progressing   Problem: Nutrition: Goal: Adequate nutrition will be maintained Outcome: Progressing   Problem: Coping: Goal: Level of anxiety will decrease Outcome: Progressing   Problem: Elimination: Goal: Will not experience complications related to bowel motility Outcome: Progressing   Problem: Elimination: Goal: Will not experience complications related to urinary retention Outcome: Progressing   Problem: Pain Managment: Goal: General experience of comfort will improve Outcome: Progressing   Problem: Safety: Goal: Ability to remain free from injury will improve Outcome: Progressing   Problem: Skin Integrity: Goal: Risk for impaired skin integrity will decrease Outcome: Progressing

## 2020-11-25 NOTE — Consult Note (Signed)
Neurosurgery Consultation  Reason for Consult: Closed fracture of the lumbar spine Referring Physician: Regalado  CC: Low back and hip pain  HPI: This is a 75 y.o. woman that presents with back and BLE pain. For her back pain, she has chronic back pain but it has been significantly worse since the fall that led to her admission. That pain is sharp in quality, moderate to severe in intensity, in the midline of her low back, worse w/ activity and improved with rest. 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 This has been present for years and worse over the past month, but not worsened after her fall. Aside from the above baseline symptoms, no new weakness, numbness, or parasthesias, no recent change in bowel or bladder function. Of note, she has been able to ambulate since the incident.   ROS: A 14 point ROS was performed and is negative except as noted in the HPI.   PMHx:  Past Medical History:  Diagnosis Date   Diabetes mellitus without complication (HCC)    Interstitial cystitis    Thyroid disease    FamHx: History reviewed. No pertinent family history. SocHx:  reports that she has never smoked. She has never used smokeless tobacco. She reports that she does not drink alcohol and does not use drugs.  Exam: Vital signs in last 24 hours: Temp:  [98.1 F (36.7 C)-98.8 F (37.1 C)] 98.1 F (36.7 C) (11/02 0832) Pulse Rate:  [67-98] 67 (11/02 0832) Resp:  [18] 18 (11/02 0832) BP: (111-134)/(47-70) 134/70 (11/02 0832) SpO2:  [93 %-97 %] 97 % (11/02 0832) General: Awake, alert, cooperative, sitting up in chair in NAD, appears mildly uncomfortable Head: Normocephalic, +scattered ecchymoses HEENT: Neck supple Pulmonary: breathing room air comfortably, no evidence of increased work of breathing Psych: odd affect but reactive and full Abdomen: S NT ND Extremities: Warm and well perfused x4 Neuro: Strength 5/5 x4,  SILTx4 except baseline stocking numbness up to the ankle bilaterally, no hoffman's, no clonus   Assessment and Plan: 75 y.o. woman s/p fall. CT L-spine personally reviewed, which shows L5 compression fracture, AO A2 subtype.   -discussed w/ the pt, would not recommend neurosurgical intervention. She has been ambulating without a brace, with her hip pain and body habitus I suspect the brace will be less comfortable for her and worsen her ambulation, but can wear a TLSO if her back pain is severe when standing -follow up with me in 6 weeks -please call with any concerns or questions  Jadene Pierini, MD 11/25/20 3:40 PM [facility]

## 2020-11-25 NOTE — Evaluation (Signed)
Occupational Therapy Evaluation Patient Details Name: Jillian Leblanc MRN: [id number] DOB: 12-28-1945 Today's Date: 11/25/2020   History of Present Illness Pt is 75 yo female presenting with falls. CT Lumbar spine showed acute L5 compression fracture. She was noted to have AKI. She was noted to be hypoxic on RA.medical history significant of hypothyroidism, DM2, SSS, interstitial cystitis.   Clinical Impression   Pt admitted with the above diagnoses and presents with below problem list. Pt will benefit from continued acute OT to address the below listed deficits and maximize independence with basic ADLs prior to d/c to venue below. PTA pt was mod I with most ADLs, h/o falls. Pt currently needs up to max A +1 for LB ADLs, +2 assist with bed mobility (more so returning to bed d/t fatigue/pain). Pt adamant that she wants to go home a d/c; spouse able to assist per pt report. Full session details below.        Recommendations for follow up therapy are one component of a multi-disciplinary discharge planning process, led by the attending physician.  Recommendations may be updated based on patient status, additional functional criteria and insurance authorization.   Follow Up Recommendations  Home health OT    Assistance Recommended at Discharge Frequent or constant Supervision/Assistance  Functional Status Assessment  Patient has had a recent decline in their functional status and demonstrates the ability to make significant improvements in function in a reasonable and predictable amount of time.  Equipment Recommendations  Other (comment) (TBD)    Recommendations for Other Services       Precautions / Restrictions Precautions Precautions: Back;Fall Precaution Comments: possible surgery Restrictions Weight Bearing Restrictions: No      Mobility Bed Mobility Overal bed mobility: Needs Assistance Bed Mobility: Rolling;Sidelying to Sit;Sit to Supine Rolling: Mod assist Sidelying to  sit: Mod assist   Sit to supine: Mod assist;+2 for physical assistance;HOB elevated   General bed mobility comments: cues for technique. assist for trunk elevation and to pivot hips into neutral position in supine    Transfers Overall transfer level: Needs assistance Equipment used: Rolling walker (2 wheels) Transfers: Sit to/from Stand Sit to Stand: Mod assist           General transfer comment: to/from EOB. assist to powerup and steady      Balance Overall balance assessment: History of Falls;Needs assistance Sitting-balance support: Feet supported;Single extremity supported;Bilateral upper extremity supported Sitting balance-Leahy Scale: Fair     Standing balance support: Bilateral upper extremity supported Standing balance-Leahy Scale: Poor                             ADL either performed or assessed with clinical judgement   ADL Overall ADL's : Needs assistance/impaired Eating/Feeding: Set up   Grooming: Set up;Sitting   Upper Body Bathing: Moderate assistance;Sitting   Lower Body Bathing: Maximal assistance;Sit to/from stand   Upper Body Dressing : Moderate assistance;Sitting   Lower Body Dressing: Maximal assistance;Sit to/from stand                 General ADL Comments: Pt sidestepped along EOB. +1 mod A to stand, min A to side step, +2 assist for bed mobility     Vision         Perception     Praxis      Pertinent Vitals/Pain Pain Assessment: Faces Pain Score: 7  Faces Pain Scale: Hurts whole lot Pain Location: back Pain Descriptors /  Indicators: Grimacing;Moaning;Guarding Pain Intervention(s): Monitored during session;Repositioned;Limited activity within patient's tolerance;Patient requesting pain meds-RN notified     Hand Dominance Right   Extremity/Trunk Assessment Upper Extremity Assessment Upper Extremity Assessment: Generalized weakness   Lower Extremity Assessment Lower Extremity Assessment: Defer to PT  evaluation   Cervical / Trunk Assessment Cervical / Trunk Assessment: Other exceptions (CT Lumbar spine showed acute L5 compression fracture.)   Communication Communication Communication: No difficulties   Cognition Arousal/Alertness: Awake/alert Behavior During Therapy: WFL for tasks assessed/performed;Anxious Overall Cognitive Status: Within Functional Limits for tasks assessed                                       General Comments       Exercises     Shoulder Instructions      Home Living Family/patient expects to be discharged to:: Private residence Living Arrangements: Spouse/significant other Available Help at Discharge: Family Type of Home: House Home Access: Stairs to enter [employer] of Steps: 2 Entrance Stairs-Rails: Right Home Layout: Two level;Full bath on main level;Able to live on main level with bedroom/bathroom     Bathroom Shower/Tub: Walk-in shower;Tub only   Bathroom Toilet: Handicapped height Bathroom Accessibility: Yes   Home Equipment: Conservation officer, nature (2 wheels);Cane - quad;Grab bars - toilet;Grab bars - tub/shower          Prior Functioning/Environment Prior Level of Function : History of Falls (last six months)             Mobility Comments: h/o falls. uses rw          OT Problem List: Decreased strength;Impaired balance (sitting and/or standing);Decreased knowledge of use of DME or AE;Decreased knowledge of precautions;Pain      OT Treatment/Interventions: Self-care/ADL training;DME and/or AE instruction;Therapeutic activities;Patient/family education;Balance training    OT Goals(Current goals can be found in the care plan section) Acute Rehab OT Goals Patient Stated Goal: home OT Goal Formulation: With patient Potential to Achieve Goals: Good  OT Frequency: Min 2X/week   Barriers to D/C: Decreased caregiver support  currently needs +2 assist for bed mobility       Co-evaluation               AM-PAC OT "6 Clicks" Daily Activity     Outcome Measure Help from another person eating meals?: A Little Help from another person taking care of personal grooming?: A Little Help from another person toileting, which includes using toliet, bedpan, or urinal?: A Lot Help from another person bathing (including washing, rinsing, drying)?: A Lot Help from another person to put on and taking off regular upper body clothing?: A Little Help from another person to put on and taking off regular lower body clothing?: A Lot 6 Click Score: 15   End of Session Equipment Utilized During Treatment: Rolling walker (2 wheels) Nurse Communication: Patient requests pain meds  Activity Tolerance: Patient limited by pain Patient left: in bed;with call bell/phone within reach;with bed alarm set  OT Visit Diagnosis: Unsteadiness on feet (R26.81);Muscle weakness (generalized) (M62.81);Pain                Time: [id number] OT Time Calculation (min): 36 min Charges:  OT General Charges $OT Visit: 1 Visit OT Evaluation $OT Eval Moderate Complexity: 1 Mod OT Treatments $Self Care/Home Management : 8-22 mins  Tyrone Schimke, OT Acute Rehabilitation Services Pager: [phone] Office: [phone]   Hortencia Pilar 11/25/2020,  2:11 PM

## 2020-11-26 ENCOUNTER — Inpatient Hospital Stay (HOSPITAL_COMMUNITY): Payer: Medicare Other

## 2020-11-26 LAB — URIC ACID: Uric Acid, Serum: 7.6 mg/dL — ABNORMAL HIGH (ref 2.5–7.1)

## 2020-11-26 LAB — BASIC METABOLIC PANEL
Anion gap: 7 (ref 5–15)
BUN: 21 mg/dL (ref 8–23)
CO2: 27 mmol/L (ref 22–32)
Calcium: 7.5 mg/dL — ABNORMAL LOW (ref 8.9–10.3)
Chloride: 101 mmol/L (ref 98–111)
Creatinine, Ser: 0.98 mg/dL (ref 0.44–1.00)
GFR, Estimated: >60 mL/min (ref >60)
Glucose, Bld: 165 mg/dL — ABNORMAL HIGH (ref 70–99)
Potassium: 4.1 mmol/L (ref 3.5–5.1)
Sodium: 135 mmol/L (ref 135–145)

## 2020-11-26 LAB — GLUCOSE, CAPILLARY
Glucose-Capillary: 134 mg/dL — ABNORMAL HIGH (ref 70–99)
Glucose-Capillary: 121 mg/dL — ABNORMAL HIGH (ref 70–99)
Glucose-Capillary: 134 mg/dL — ABNORMAL HIGH (ref 70–99)
Glucose-Capillary: 224 mg/dL — ABNORMAL HIGH (ref 70–99)

## 2020-11-26 LAB — CBC
HCT: 28.4 % — ABNORMAL LOW (ref 36.0–46.0)
Hemoglobin: 9.5 g/dL — ABNORMAL LOW (ref 12.0–15.0)
MCH: 30.3 pg (ref 26.0–34.0)
MCHC: 33.5 g/dL (ref 30.0–36.0)
MCV: 90.4 fL (ref 80.0–100.0)
Platelets: 234 10*3/uL (ref 150–400)
RBC: 3.14 MIL/uL — ABNORMAL LOW (ref 3.87–5.11)
RDW: 14.6 % (ref 11.5–15.5)
WBC: 6.5 10*3/uL (ref 4.0–10.5)
nRBC: 0 % (ref 0.0–0.2)

## 2020-11-26 LAB — AMMONIA: Ammonia: 15 umol/L (ref 9–35)

## 2020-11-26 MED ORDER — DICLOFENAC SODIUM 1 % EX GEL
2.0000 g | Freq: Four times a day (QID) | CUTANEOUS | Status: DC
  Administered 2020-11-26 – 2020-11-27 (×5): 2 g via TOPICAL
  Filled 2020-11-26: qty 100

## 2020-11-26 MED ORDER — SODIUM CHLORIDE 0.9 % IV SOLN: INTRAVENOUS | Status: DC

## 2020-11-26 MED ORDER — PREDNISONE 20 MG PO TABS
40.0000 mg | ORAL_TABLET | Freq: Every day | ORAL | Status: DC
  Administered 2020-11-26 – 2020-11-27 (×2): 40 mg via ORAL
  Filled 2020-11-26 (×2): qty 2

## 2020-11-26 MED ORDER — KETOROLAC TROMETHAMINE 15 MG/ML IJ SOLN
15.0000 mg | Freq: Three times a day (TID) | INTRAMUSCULAR | Status: DC | PRN
  Administered 2020-11-26 (×2): 15 mg via INTRAVENOUS
  Filled 2020-11-26 (×2): qty 1

## 2020-11-26 MED ORDER — OXYCODONE-ACETAMINOPHEN 5-325 MG PO TABS
1.0000 | ORAL_TABLET | ORAL | Status: DC | PRN
  Administered 2020-11-26 – 2020-11-27 (×5): 1 via ORAL
  Filled 2020-11-26 (×5): qty 1

## 2020-11-26 MED ORDER — COLCHICINE 0.6 MG PO TABS
0.6000 mg | ORAL_TABLET | Freq: Every day | ORAL | Status: DC
  Administered 2020-11-26 – 2020-11-27 (×2): 0.6 mg via ORAL
  Filled 2020-11-26 (×2): qty 1

## 2020-11-26 MED ORDER — OXYCODONE HCL 5 MG PO TABS
5.0000 mg | ORAL_TABLET | Freq: Four times a day (QID) | ORAL | Status: DC | PRN
  Administered 2020-11-26 – 2020-11-27 (×4): 5 mg via ORAL
  Filled 2020-11-26 (×4): qty 1

## 2020-11-26 NOTE — Care Management Important Message (Signed)
Important Message  Patient Details  Name: Jillian Leblanc MRN: [id number] Date of Birth: [birth year]   Medicare Important Message Given:  Yes     Sora Olivo 11/26/2020, 3:10 PM

## 2020-11-26 NOTE — Progress Notes (Signed)
Mobility Specialist Progress Note:   11/26/20 1200  Mobility  Activity Sat and stood x 3  Level of Assistance Minimal assist, patient does 75% or more  Assistive Device Other (Comment) (HHA)  Mobility Response Tolerated fair  Mobility performed by Mobility specialist  $Mobility charge 1 Mobility   Pt with significant R hip pain during session. Also had neuropathy in feet when standing. Will see for second session for hopeful ambulation this afternoon. Still no brace in room.   Addison Lank Mobility Specialist  Phone [phone]

## 2020-11-26 NOTE — Progress Notes (Signed)
Orthopedic Tech Progress Note Patient Details:  Jillian Leblanc 02-23-1945 [id number]  Ortho Devices Ortho Device/Splint Location: TLSO Ortho Device/Splint Interventions: Ordered  Left TLSO at bedside    Torrance Frech A Ragina Fenter 11/26/2020, 2:09 PM

## 2020-11-26 NOTE — Progress Notes (Addendum)
PROGRESS NOTE    Jillian Leblanc  [id number] DOB: 1945-06-06 DOA: 11/22/2020 PCP: Guadlupe Spanish, MD   Brief Narrative: 75 year old with past medical history significant for hypothyroidism, type 2 diabetes, sick sinus syndrome, interstitial cystitis, frequent fall.  Patient presented from home to the ED on 10/30 with back pain after falling in her driveway.  In the ED she was found to have acute L5 compression fracture.  She was placed on brace and started on pain medication.  Neurosurgery was consulted and recommended transfer patient to [facility] for possible procedure.  I have Contacted Dr. Venetia Constable today, he will see patient in consultation today.   Assessment & Plan:   Active Problems:   AKI (acute kidney injury) ([place])   Syncope   Fall   Compression of lumbar vertebra (HCC)   Type 2 diabetes mellitus without complication, with long-term current use of insulin (HCC)   Elevated transaminase level   Hypothyroidism  1-Acute L5 compression fracture: CT thoracic spine also showed deformity of T12 and height loss T3-T4 -Plan to change oxycodone to every 4 hours as needed. - -Dr. Venetia Constable consulted. Recommenced pian management.  -Continue with gabapentin.   2-Hip pain: X-ray on admission negative.  Patient complaining of left hip pain, limping.  Plan to proceed with CT left hip negative for fracture.   3-AKI: Patient presented with a creatinine of 1.9.  Renal ultrasound show distended urinary urinary bladder and mild bilateral hydronephrosis. -Patient unable to urinate this morning, Foley catheter placed 11/2. -Continue with IV fluids. -Renal function has improved. -She will need to follow up with urology out patient.   Acute encephalopathy;  More confuse, suspect related to pain medications.  CT head repeated no acute finding.  Ammonia normal.   Frequent fall: PT OT consulted. Benefit from skilled rehab. Might be related to hypoglycemia.   Hypocalcemia  hypoalbuminemia: Continue calcium supplements.  Transaminases: Resolved.  Hepatitis panel negative. Mild elevation of alkaline phosphatase.  Follow trend Hypoxia; resolved. Chest x-ray did not show any active disease.  Wean off of oxygen as possible.  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 Diabetes type 2: Hemoglobin A1c 8.4: Farxiga and glipizide at home Oral intake in the hospital, having hypoglycemia in the hospital, change insulin sliding scale to sensitive. Hypothyroidism: Continue with Synthroid.  Feet pain, right big toe pain. Uric acid elevated. Started colchicine. Give a dose of prednisone.      Estimated body mass index is 41.01 kg/m as calculated from the following:   Height as of this encounter: 5' (1.524 m).   Weight as of this encounter: 95.3 kg.   DVT prophylaxis: SCDs, if no plan for surgery will start Lovenox Code Status: Full code Family Communication: Care discussed with patient and daughter over phone/ Disposition Plan:  Status is: Inpatient  Remains inpatient appropriate because: Patient found to have compression fracture, still complaining of back pain, awaiting neurosurgery evaluation.  Pain uncontrolled.  Requiring IV pain medication        Consultants:  Dr Zada Finders  Procedures:  None  Antimicrobials:  none  Subjective: She is very uncomfortable.  She is having left hip pain.  She is also complaining of back pain.  Yesterday when she stands up she was having  tingling pain in her feet.  Objective: Vitals:   11/25/20 0832 11/25/20 2016 11/26/20 0447 11/26/20 0830  BP: 134/70 (!) 135/50 (!) 130/55 137/74  Pulse: 67 84 93 91  Resp: 18 18 18 18   Temp: 98.1 F (  36.7 C) 98.6 F (37 C) (!) 97.5 F (36.4 C) 98.4 F (36.9 C)  TempSrc: Oral Oral Oral Oral  SpO2: 97% (!) 67% 96% 97%  Weight:      Height:        Intake/Output Summary (Last 24 hours) at 11/26/2020 1253 Last data filed at 11/26/2020 0911 Gross per 24 hour   Intake 0 ml  Output 1900 ml  Net -1900 ml    Filed Weights   11/22/20 1846  Weight: 95.3 kg    Examination:  General exam: Appears calm and comfortable  Respiratory system: Clear to auscultation. Respiratory effort normal. Cardiovascular system: S1 & S2 heard, RRR. No JVD, murmurs, rubs, gallops or clicks. No pedal edema. Gastrointestinal system: Abdomen is nondistended, soft and nontender. No organomegaly or masses felt. Normal bowel sounds heard. Central nervous system: Alert and oriented. No focal neurological deficits. Extremities: Symmetric 5 x 5 power.   Data Reviewed: I have personally reviewed following labs and imaging studies  CBC: Recent Labs  Lab 11/22/20 1932 11/23/20 1609 11/24/20 0148 11/25/20 0154 11/26/20 0055  WBC 6.4 5.7 5.6 6.1 6.5  NEUTROABS 5.0 4.2  --   --   --   HGB 11.0* 10.9* 10.2* 9.8* 9.5*  HCT 32.5* 32.9* 30.4* 30.3* 28.4*  MCV 87.6 90.1 88.1 91.3 90.4  PLT 243 224 191 216 234    Basic Metabolic Panel: Recent Labs  Lab 11/22/20 1932 11/23/20 1609 11/24/20 0148 11/25/20 0154 11/26/20 0055  NA 132* 132* 136 136 135  K 3.3* 3.2* 3.7 3.5 4.1  CL 89* 93* 101 102 101  CO2 25 27 25 25 27   GLUCOSE 75 63* 84 75 165*  BUN 37* 41* 28* 19 21  CREATININE 1.78* 1.99* 1.61* 1.04* 0.98  CALCIUM 6.6* 6.1* 6.6* 7.2* 7.5*  MG  --  2.0  --   --   --     GFR: Estimated Creatinine Clearance: 52.8 mL/min (by C-G formula based on SCr of 0.98 mg/dL). Liver Function Tests: Recent Labs  Lab 11/22/20 1932 11/23/20 1609 11/24/20 0148 11/25/20 0154  AST 50* 81* 47* 29  ALT 41 44 36 29  ALKPHOS 182* 207* 164* 147*  BILITOT 0.8 1.4* 1.0 1.1  PROT 7.1 7.1 5.8* 5.9*  ALBUMIN 3.3* 3.2* 2.5* 2.5*    No results for input(s): LIPASE, AMYLASE in the last 168 hours. Recent Labs  Lab 11/26/20 1009  AMMONIA 15   Coagulation Profile: No results for input(s): INR, PROTIME in the last 168 hours. Cardiac Enzymes: Recent Labs  Lab 11/22/20 2249   CKTOTAL 214    BNP (last 3 results) No results for input(s): PROBNP in the last 8760 hours. HbA1C: Recent Labs    11/23/20 1733  HGBA1C 8.4*    CBG: Recent Labs  Lab 11/25/20 1203 11/25/20 1701 11/25/20 2025 11/26/20 0816 11/26/20 1206  GLUCAP 87 103* 215* 134* 121*    Lipid Profile: No results for input(s): CHOL, HDL, LDLCALC, TRIG, CHOLHDL, LDLDIRECT in the last 72 hours. Thyroid Function Tests: No results for input(s): TSH, T4TOTAL, FREET4, T3FREE, THYROIDAB in the last 72 hours. Anemia Panel: No results for input(s): VITAMINB12, FOLATE, FERRITIN, TIBC, IRON, RETICCTPCT in the last 72 hours. Sepsis Labs: Recent Labs  Lab 11/22/20 2249 11/23/20 1733  PROCALCITON  --  0.21  LATICACIDVEN 1.1  --      Recent Results (from the past 240 hour(s))  Resp Panel by RT-PCR (Flu A&B, Covid) Nasopharyngeal Swab     Status: None  Collection Time: 11/22/20  8:56 PM   Specimen: Nasopharyngeal Swab; Nasopharyngeal(NP) swabs in vial transport medium  Result Value Ref Range Status   SARS Coronavirus 2 by RT PCR NEGATIVE NEGATIVE Final    Comment: (NOTE) SARS-CoV-2 target nucleic acids are NOT DETECTED.  The SARS-CoV-2 RNA is generally detectable in upper respiratory specimens during the acute phase of infection. The lowest concentration of SARS-CoV-2 viral copies this assay can detect is 138 copies/mL. A negative result does not preclude SARS-Cov-2 infection and should not be used as the sole basis for treatment or other patient management decisions. A negative result may occur with  improper specimen collection/handling, submission of specimen other than nasopharyngeal swab, presence of viral mutation(s) within the areas targeted by this assay, and inadequate number of viral copies(<138 copies/mL). A negative result must be combined with clinical observations, patient history, and epidemiological information. The expected result is Negative.  Fact Sheet for Patients:   BloggerCourse.comhttps://www.fda.gov/media/152166/download  Fact Sheet for Healthcare Providers:  SeriousBroker.ithttps://www.fda.gov/media/152162/download  This test is no t yet approved or cleared by the [place] FDA and  has been authorized for detection and/or diagnosis of SARS-CoV-2 by FDA under an Emergency Use Authorization (EUA). This EUA will remain  in effect (meaning this test can be used) for the duration of the COVID-19 declaration under Section 564(b)(1) of the Act, 21 U.S.C.section 360bbb-3(b)(1), unless the authorization is terminated  or revoked sooner.       Influenza A by PCR NEGATIVE NEGATIVE Final   Influenza B by PCR NEGATIVE NEGATIVE Final    Comment: (NOTE) The Xpert Xpress SARS-CoV-2/FLU/RSV plus assay is intended as an aid in the diagnosis of influenza from Nasopharyngeal swab specimens and should not be used as a sole basis for treatment. Nasal washings and aspirates are unacceptable for Xpert Xpress SARS-CoV-2/FLU/RSV testing.  Fact Sheet for Patients: BloggerCourse.comhttps://www.fda.gov/media/152166/download  Fact Sheet for Healthcare Providers: SeriousBroker.ithttps://www.fda.gov/media/152162/download  This test is not yet approved or cleared by the [place] FDA and has been authorized for detection and/or diagnosis of SARS-CoV-2 by FDA under an Emergency Use Authorization (EUA). This EUA will remain in effect (meaning this test can be used) for the duration of the COVID-19 declaration under Section 564(b)(1) of the Act, 21 U.S.C. section 360bbb-3(b)(1), unless the authorization is terminated or revoked.  Performed at [facility] [place] [facility], [address] [place], [place] [id number]           Radiology Studies: CT HEAD WO CONTRAST (5MM)  Result Date: 11/26/2020 CLINICAL DATA:  Headache EXAM: CT HEAD WITHOUT CONTRAST TECHNIQUE: Contiguous axial images were obtained from the base of the skull through the vertex without intravenous contrast. COMPARISON:  CT head 11/22/2020 FINDINGS: Brain:  No evidence of acute infarction, hemorrhage, hydrocephalus, extra-axial collection or mass lesion/mass effect. Vascular: No hyperdense vessel or unexpected calcification. Skull: Normal. Negative for fracture or focal lesion. Sinuses/Orbits: No acute finding. Other: Left frontal scalp hematoma similar to previous. IMPRESSION: No acute intracranial process identified. Left frontal scalp hematoma. Electronically Signed   By: Jannifer Hickelaney  Williams M.D.   On: 11/26/2020 11:33   CT HIP LEFT WO CONTRAST  Result Date: 11/26/2020 CLINICAL DATA:  Acute left hip pain. EXAM: CT OF THE LEFT HIP WITHOUT CONTRAST TECHNIQUE: Multidetector CT imaging of the left hip was performed according to the standard protocol. Multiplanar CT image reconstructions were also generated. COMPARISON:  CT scan 06/08/2018 FINDINGS: The left hip is normally located. Mild degenerative changes but no fracture or AVN. The visualized left hemipelvis  bony structures are intact. No pubic rami fractures. The surrounding hip and pelvic musculature are grossly normal by CT. No obvious muscle tear/intramuscular hematoma. No significant intrapelvic abnormalities are identified. There is a Foley catheter noted in the bladder. IMPRESSION: 1. Mild degenerative changes involving the left hip but no fracture or AVN. 2. Grossly normal CT appearance of the surrounding hip and pelvic musculature. Electronically Signed   By: Marijo Sanes M.D.   On: 11/26/2020 08:13        Scheduled Meds:  calcium carbonate  1 tablet Oral BID   colchicine  0.6 mg Oral Daily   diclofenac Sodium  2 g Topical QID   feeding supplement  237 mL Oral BID BM   gabapentin  300 mg Oral BID   insulin aspart  0-9 Units Subcutaneous TID WC   levothyroxine  150 mcg Oral QAC breakfast   polyethylene glycol  17 g Oral BID   pramipexole  1.5 mg Oral TID   predniSONE  40 mg Oral Q breakfast   Continuous Infusions:  sodium chloride 75 mL/hr at 11/26/20 1032     LOS: 3 days    Time  spent: 35 minutes.     Elmarie Shiley, MD [facility]   If 7PM-7AM, please contact night-coverage [email or web address]  11/26/2020, 12:53 PM

## 2020-11-26 NOTE — Progress Notes (Signed)
Mobility Specialist Progress Note:   11/26/20 1600  Mobility  Activity Ambulated in room  Level of Assistance Contact guard assist, steadying assist  Assistive Device Front wheel walker  Distance Ambulated (ft) 4 ft  Mobility Ambulated with assistance in room  Mobility Response Tolerated fair  Mobility performed by Mobility specialist  $Mobility charge 1 Mobility   TLSO brace put on with pt satisfaction. Attempted ambulation, neuropathy too painful in R foot. Was able to take 4 steps before impulsively sitting EOB.   Addison Lank Mobility Specialist  Phone [phone]

## 2020-11-26 NOTE — TOC Initial Note (Signed)
Transition of Care [facility]) - Initial/Assessment Note    Patient Details  Name: Jillian Leblanc MRN: [id number] Date of Birth: 1945/02/01  Transition of Care [facility]) CM/SW Contact:    Kingsley Plan, RN Phone Number: 11/26/2020, 2:06 PM  Clinical Narrative:                 Patient from home with husband. Discussed PT recommendations for SNF. Patient voiced understanding but prefers to go home with home health services through [facility] at discharge. Kandee Keen with [facility] accepted referral.  Patient already has walker , Rollator , and shower chair at home. Patient plans for her husband to transport her home via car  Expected Discharge Plan: Home w Home Health Services Barriers to Discharge: Continued Medical Work up   Patient Goals and CMS Choice Patient states their goals for this hospitalization and ongoing recovery are:: to return to home CMS Medicare.gov Compare Post Acute Care list provided to:: Patient Choice offered to / list presented to : Patient  Expected Discharge Plan and Services Expected Discharge Plan: Home w Home Health Services   Discharge Planning Services: CM Consult Post Acute Care Choice: Home Health Living arrangements for the past 2 months: Single Family Home                 DME Arranged: N/A DME Agency: NA       HH Arranged: PT, OT, Nurse's Aide, Social Work [employer] Agency: [facility] Date [facility] Agency Contacted: 11/26/20 Time HH Agency Contacted: [phone] Representative spoke with at [facility] Agency: Kandee Keen  Prior Living Arrangements/Services Living arrangements for the past 2 months: Single Family Home Lives with:: Spouse Patient language and need for interpreter reviewed:: Yes Do you feel safe going back to the place where you live?: Yes      Need for Family Participation in Patient Care: Yes (Comment) Care giver support system in place?: Yes (comment) Current home services: DME Criminal Activity/Legal Involvement Pertinent to Current  Situation/Hospitalization: No - Comment as needed  Activities of Daily Living Home Assistive Devices/Equipment: Grab bars in shower, Eyeglasses, Walker (specify type), Other (Comment), Cane (specify quad or straight), Shower chair with back (quad cane, 4 wheeled walker, walk-in shower, tub/shower unit) ADL Screening (condition at time of admission) Patient's cognitive ability adequate to safely complete daily activities?: Yes Is the patient deaf or have difficulty hearing?: No Does the patient have difficulty seeing, even when wearing glasses/contacts?: No Does the patient have difficulty concentrating, remembering, or making decisions?: No Patient able to express need for assistance with ADLs?: Yes Does the patient have difficulty dressing or bathing?: Yes Independently performs ADLs?: No (secondary to weakness and falling) Communication: Independent Dressing (OT): Needs assistance Is this a change from baseline?: Change from baseline, expected to last >3 days Grooming: Independent Feeding: Independent Bathing: Needs assistance Is this a change from baseline?: Change from baseline, expected to last >3 days Toileting: Needs assistance Is this a change from baseline?: Change from baseline, expected to last >3days In/Out Bed: Needs assistance Is this a change from baseline?: Change from baseline, expected to last >3 days Walks in Home: Needs assistance Is this a change from baseline?: Change from baseline, expected to last >3 days Does the patient have difficulty walking or climbing stairs?: Yes (secondary to weakness and falling) Weakness of Legs: Both Weakness of Arms/Hands: None  Permission Sought/Granted   Permission granted to share information with : No  Emotional Assessment Appearance:: Appears stated age Attitude/Demeanor/Rapport: Engaged Affect (typically observed): Accepting Orientation: : Oriented to Self, Oriented to Place, Oriented to  Time, Oriented to  Situation Alcohol / Substance Use: Not Applicable Psych Involvement: No (comment)  Admission diagnosis:  Fall [W19.XXXA] AKI (acute kidney injury) ([place]) [N17.9] Syncope [R55] Patient Active Problem List   Diagnosis Date Noted   Fall    Compression of lumbar vertebra ([place])    Type 2 diabetes mellitus without complication, with long-term current use of insulin (HCC)    Elevated transaminase level    Hypothyroidism    Syncope 11/23/2020   AKI (acute kidney injury) ([place]) 11/22/2020   Multiple falls 09/14/2020   Pressure injury of skin 09/14/2020   Generalized weakness 09/14/2020   Symptomatic anemia 07/26/2020   Acute kidney injury superimposed on chronic kidney disease ([place]) 07/26/2020   Hyponatremia 07/26/2020   Hypokalemia 07/26/2020   Dislocated shoulder, right, initial encounter 07/26/2020   Closed displaced fracture of coracoid process of right shoulder 07/26/2020   Diabetes mellitus type 2 in obese ([place]) 07/26/2020   Elevated troponin 07/26/2020   Recurrent falls 08/16/2019   Sinus arrest 08/15/2019   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 - [place], [place] - [address] [place] [address] [id number], [address] [place] [address] Phone: [phone] Fax: [phone]     Social Determinants of Health (SDOH) Interventions    Readmission Risk Interventions No flowsheet data found.

## 2020-11-27 LAB — BASIC METABOLIC PANEL
Anion gap: 6 (ref 5–15)
BUN: 15 mg/dL (ref 8–23)
CO2: 27 mmol/L (ref 22–32)
Calcium: 7.8 mg/dL — ABNORMAL LOW (ref 8.9–10.3)
Chloride: 101 mmol/L (ref 98–111)
Creatinine, Ser: 0.88 mg/dL (ref 0.44–1.00)
GFR, Estimated: >60 mL/min (ref >60)
Glucose, Bld: 216 mg/dL — ABNORMAL HIGH (ref 70–99)
Potassium: 4.4 mmol/L (ref 3.5–5.1)
Sodium: 134 mmol/L — ABNORMAL LOW (ref 135–145)

## 2020-11-27 LAB — CBC WITH DIFFERENTIAL/PLATELET
Abs Immature Granulocytes: 0.06 10*3/uL (ref 0.00–0.07)
Basophils Absolute: 0 10*3/uL (ref 0.0–0.1)
Basophils Relative: 0 %
Eosinophils Absolute: 0 10*3/uL (ref 0.0–0.5)
Eosinophils Relative: 0 %
HCT: 29.7 % — ABNORMAL LOW (ref 36.0–46.0)
Hemoglobin: 9.7 g/dL — ABNORMAL LOW (ref 12.0–15.0)
Immature Granulocytes: 1 %
Lymphocytes Relative: 11 %
Lymphs Abs: 0.6 10*3/uL — ABNORMAL LOW (ref 0.7–4.0)
MCH: 29.8 pg (ref 26.0–34.0)
MCHC: 32.7 g/dL (ref 30.0–36.0)
MCV: 91.1 fL (ref 80.0–100.0)
Monocytes Absolute: 0.1 10*3/uL (ref 0.1–1.0)
Monocytes Relative: 2 %
Neutro Abs: 4.6 10*3/uL (ref 1.7–7.7)
Neutrophils Relative %: 86 %
Platelets: 257 10*3/uL (ref 150–400)
RBC: 3.26 MIL/uL — ABNORMAL LOW (ref 3.87–5.11)
RDW: 14.3 % (ref 11.5–15.5)
WBC: 5.5 10*3/uL (ref 4.0–10.5)
nRBC: 0 % (ref 0.0–0.2)

## 2020-11-27 LAB — GLUCOSE, CAPILLARY
Glucose-Capillary: 116 mg/dL — ABNORMAL HIGH (ref 70–99)
Glucose-Capillary: 197 mg/dL — ABNORMAL HIGH (ref 70–99)

## 2020-11-27 MED ORDER — DICLOFENAC SODIUM 1 % EX GEL: 2.0000 g | Freq: Four times a day (QID) | CUTANEOUS | 0 refills | Status: DC

## 2020-11-27 MED ORDER — METHOCARBAMOL 500 MG PO TABS: 500.0000 mg | ORAL_TABLET | Freq: Three times a day (TID) | ORAL | 0 refills | Status: AC | PRN

## 2020-11-27 MED ORDER — GLIPIZIDE ER 2.5 MG PO TB24
2.5000 mg | ORAL_TABLET | Freq: Every day | ORAL | 1 refills | Status: DC
Start: 2020-11-27 — End: 2020-12-08

## 2020-11-27 MED ORDER — OXYCODONE HCL 5 MG PO TABS
5.0000 mg | ORAL_TABLET | Freq: Four times a day (QID) | ORAL | 0 refills | Status: AC | PRN
Start: 2020-11-27 — End: 2020-11-30

## 2020-11-27 MED ORDER — COLCHICINE 0.6 MG PO TABS: 0.6000 mg | ORAL_TABLET | Freq: Every day | ORAL | 0 refills | Status: DC

## 2020-11-27 MED ORDER — GABAPENTIN 300 MG PO CAPS
300.0000 mg | ORAL_CAPSULE | Freq: Two times a day (BID) | ORAL | 0 refills | Status: DC
Start: 2020-11-27 — End: 2020-12-08

## 2020-11-27 MED ORDER — OXYCODONE-ACETAMINOPHEN 5-325 MG PO TABS: 1.0000 | ORAL_TABLET | ORAL | 0 refills | Status: AC | PRN

## 2020-11-27 MED ORDER — POLYETHYLENE GLYCOL 3350 17 G PO PACK: 17.0000 g | PACK | Freq: Two times a day (BID) | ORAL | 0 refills | Status: DC

## 2020-11-27 MED ORDER — ENSURE ENLIVE PO LIQD: 237.0000 mL | Freq: Two times a day (BID) | ORAL | 12 refills | Status: DC

## 2020-11-27 NOTE — Progress Notes (Signed)
Mobility Specialist Progress Note:   11/27/20 1420  Mobility  Mobility Sit up in bed/chair position for meals  Mobility Response Tolerated fair  Mobility performed by Mobility specialist  $Mobility charge 1 Mobility   Pt refused ambulation, agreed to BLE bed exercises. L hip pain limited LLE during exercises. Eager for d/c.  Addison Lank Mobility Specialist  Phone [phone]

## 2020-11-27 NOTE — Care Management (Signed)
Messaged Cory with [facility] regarding discharge today. PT recommending wheel chair and 3 in1. NCM ordered asked Tiffany PT or attending MD to co sign progress note. Patient wants both delivered to home. Both ordered with Velna Hatchet with [employer] [facility] she will call patient for delivery once note signed.

## 2020-11-27 NOTE — Discharge Summary (Signed)
Physician Discharge Summary  Jillian Leblanc [id number] DOB: 20-Sep-1945 DOA: 11/22/2020  PCP: Guadlupe Spanish, MD  Admit date: 11/22/2020 Discharge date: 11/27/2020  Admitted From: Home  Disposition:  Home   Recommendations for Outpatient Follow-up:  Follow up with PCP in 1-2 weeks Please obtain BMP/CBC in one week Follow up with neurosurgery in 6 weeks.   Discharge Condition: Stable.  CODE STATUS: Full code Diet recommendation:Carb Modified   Brief/Interim Summary: 75 year old with past medical history significant for hypothyroidism, type 2 diabetes, sick sinus syndrome, interstitial cystitis, frequent fall.  Patient presented from home to the ED on 10/30 with back pain after falling in her driveway.  In the ED she was found to have acute L5 compression fracture.  She was placed on brace and started on pain medication.  Neurosurgery was consulted and recommended transfer patient to [facility] for possible procedure.   1-Acute L5 compression fracture: CT thoracic spine also showed deformity of T12 and height loss T3-T4 -Plan to change oxycodone to every 4 hours as needed. -Dr. Venetia Constable consulted. Recommenced pian management.  -Continue with gabapentin.  -Patient and family decline rehab. Discharge home with [facility] today.   2-Hip pain: X-ray on admission negative.  Patient complaining of left hip pain, limping.  Plan to proceed with CT left hip negative for fracture.    3-AKI: Patient presented with a creatinine of 1.9.  Renal ultrasound show distended urinary urinary bladder and mild bilateral hydronephrosis. -Patient unable to urinate this morning, Foley catheter placed 11/2. -Treated  with IV fluids. -Renal function has improved. -She will need to follow up with urology out patient.  -We were able to move her appointment for Monday.   Acute encephalopathy;  More confuse, suspect related to pain medications.  CT head repeated no acute finding.  Ammonia normal.   improved.    Frequent fall: PT OT consulted. Benefit from skilled rehab. Might be related to hypoglycemia.    Hypocalcemia hypoalbuminemia: Continue calcium supplements.   Transaminases: Resolved.  Hepatitis panel negative. Mild elevation of alkaline phosphatase.  Follow trend Hypoxia; resolved. Chest x-ray did not show any active disease.  Wean off of oxygen as possible.   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 Diabetes type 2: Hemoglobin A1c 8.4: Farxiga and glipizide at home Oral intake in the hospital, having hypoglycemia in the hospital, change insulin sliding scale to sensitive. Plan to discharge on lower dose glipizide. Hold [place].  Hypothyroidism: Continue with Synthroid.   Feet pain, right big toe pain. Uric acid elevated. Started colchicine. Give one time dose of prednisone. discharge on colchicine.    Discharge Diagnoses:  Active Problems:   AKI (acute kidney injury) ([place])   Syncope   Fall   Compression of lumbar vertebra ([place])   Type 2 diabetes mellitus without complication, with long-term current use of insulin (HCC)   Elevated transaminase level   Hypothyroidism    Discharge Instructions  Discharge Instructions     Diet - low sodium heart healthy   Complete by: As directed    Increase activity slowly   Complete by: As directed       Allergies as of 11/27/2020       Reactions   Pentosan Polysulfate Sodium    Other reaction(s): Other (See Comments) ELMIRON Y Drug hair fell out 10/21/2009 12:00:00 AM by [name] CNA 1   Elmiron [pentosan Polysulfate]    Naloxone Other (See Comments)   Hallucinations Confusion Nightmares        Medication  List     STOP taking these medications    ALEVE PM PO   amitriptyline 100 MG tablet Commonly known as: ELAVIL   Farxiga 10 MG Tabs tablet Generic drug: dapagliflozin propanediol   furosemide 40 MG tablet Commonly known as: LASIX   ibuprofen 600 MG tablet Commonly known as: ADVIL    insulin glargine (1 Unit Dial) 300 UNIT/ML Solostar Pen Commonly known as: TOUJEO   lidocaine 5 % Commonly known as: Lidoderm   oxyCODONE-acetaminophen 10-325 MG tablet Commonly known as: PERCOCET Replaced by: oxyCODONE-acetaminophen 5-325 MG tablet       TAKE these medications    Aspirin Low Dose 81 MG EC tablet Generic drug: aspirin Take 81 mg by mouth daily.   calcium-vitamin D 250-100 MG-UNIT tablet Take 1 tablet by mouth 2 (two) times daily.   colchicine 0.6 MG tablet Take 1 tablet (0.6 mg total) by mouth daily.   diclofenac Sodium 1 % Gel Commonly known as: VOLTAREN Apply 2 g topically 4 (four) times daily.   feeding supplement Liqd Take 237 mLs by mouth 2 (two) times daily between meals.   ferrous sulfate 325 (65 FE) MG tablet Take 1 tablet (325 mg total) by mouth daily with breakfast.   gabapentin 300 MG capsule Commonly known as: NEURONTIN Take 1 capsule (300 mg total) by mouth 2 (two) times daily.   glipiZIDE 2.5 MG 24 hr tablet Commonly known as: GLUCOTROL XL Take 1 tablet (2.5 mg total) by mouth daily with breakfast. What changed:  medication strength how much to take   hydrOXYzine 25 MG tablet Commonly known as: ATARAX/VISTARIL Take 25 mg by mouth at bedtime as needed for anxiety.   levothyroxine 150 MCG tablet Commonly known as: SYNTHROID Take 150 mcg by mouth daily.   methocarbamol 500 MG tablet Commonly known as: ROBAXIN Take 1 tablet (500 mg total) by mouth every 8 (eight) hours as needed for up to 5 days for muscle spasms.   nystatin cream Commonly known as: MYCOSTATIN Apply 1 application topically 2 (two) times daily as needed for dry skin.   ondansetron 4 MG disintegrating tablet Commonly known as: ZOFRAN-ODT Take 4 mg by mouth every 8 (eight) hours as needed for nausea or vomiting.   oxyCODONE 5 MG immediate release tablet Commonly known as: Oxy IR/ROXICODONE Take 1 tablet (5 mg total) by mouth every 6 (six) hours as needed  for up to 3 days for moderate pain.   oxyCODONE-acetaminophen 5-325 MG tablet Commonly known as: PERCOCET/ROXICET Take 1 tablet by mouth every 4 (four) hours as needed for up to 3 days for moderate pain. Replaces: oxyCODONE-acetaminophen 10-325 MG tablet   polyethylene glycol 17 g packet Commonly known as: MIRALAX / GLYCOLAX Take 17 g by mouth 2 (two) times daily.   pramipexole 1.5 MG tablet Commonly known as: MIRAPEX Take 1.5 mg by mouth 3 (three) times daily.        Follow-up Information     [facility] Follow up.   Specialty: Home Health Services Contact information: [place] [address] [phone]         Trinda Pascal, FNP Follow up in 3 day(s).   Specialty: Family Medicine Why: Appt scheduled for Monday, November 30, 2020 at 1:40 pm. Please bring discharge summary, insurance cards and current medications list with you. Spoke with Posey Boyer information: [phone] [place] [address] [phone]  Allergies  Allergen Reactions   Pentosan Polysulfate Sodium     Other reaction(s): Other (See Comments) ELMIRON Y Drug hair fell out 10/21/2009 12:00:00 AM by [name] CNA 1   Elmiron [Pentosan Polysulfate]    Naloxone Other (See Comments)    Hallucinations Confusion Nightmares    Consultations: Neurosurgery    Procedures/Studies: DG Lumbar Spine Complete  Result Date: 11/22/2020 CLINICAL DATA:  Status post fall EXAM: LUMBAR SPINE - COMPLETE 4+ VIEW COMPARISON:  CT abdomen pelvis 09/15/2012, CT angiography chest 09/29/2020 FINDINGS: Markedly limited evaluation due to overlapping osseous structures and overlying soft tissues. Five non-rib-bearing lumbar vertebral bodies. Grade 1 anterolisthesis of L5 on S1. Vertebral body height loss of the L5 vertebral body. Interval development of T12 anterior wedge compression fracture. T11 vertebral body height loss. Aortic calcification.  Neurostimulator lead overlies the pelvis. Right upper quadrant surgical clips. IMPRESSION: 1. Grade 1 anterolisthesis of L5 on S1. Vertebral body height loss of the L5 vertebral body. Underlying acute component not excluded. Markedly limited evaluation due to overlapping osseous structures and overlying soft tissues. Recommend cross-sectional imaging for further evaluation. 2. Interval development of a T12 anterior wedge compression fracture. Recommend cross-sectional imaging for further evaluation. 3.  Aortic Atherosclerosis (ICD10-I70.0). Electronically Signed   By: Iven Finn M.D.   On: 11/22/2020 20:48   DG Pelvis 1-2 Views  Result Date: 11/22/2020 CLINICAL DATA:  Multiple falls EXAM: PELVIS - 1-2 VIEW COMPARISON:  None. FINDINGS: There is no evidence of pelvic fracture or diastasis. No definite acute displaced fracture or dislocation of bilateral hips. No pelvic bone lesions are seen. IMPRESSION: Negative for acute traumatic injury. Electronically Signed   By: Iven Finn M.D.   On: 11/22/2020 20:49   CT HEAD WO CONTRAST (5MM)  Result Date: 11/26/2020 CLINICAL DATA:  Headache EXAM: CT HEAD WITHOUT CONTRAST TECHNIQUE: Contiguous axial images were obtained from the base of the skull through the vertex without intravenous contrast. COMPARISON:  CT head 11/22/2020 FINDINGS: Brain: No evidence of acute infarction, hemorrhage, hydrocephalus, extra-axial collection or mass lesion/mass effect. Vascular: No hyperdense vessel or unexpected calcification. Skull: Normal. Negative for fracture or focal lesion. Sinuses/Orbits: No acute finding. Other: Left frontal scalp hematoma similar to previous. IMPRESSION: No acute intracranial process identified. Left frontal scalp hematoma. Electronically Signed   By: Ofilia Neas M.D.   On: 11/26/2020 11:33   CT Head Wo Contrast  Result Date: 11/22/2020 CLINICAL DATA:  Head trauma, minor. Facial trauma. Neck trauma. Additional history provided: Frequent  falls. EXAM: CT HEAD WITHOUT CONTRAST CT MAXILLOFACIAL WITHOUT CONTRAST CT CERVICAL SPINE WITHOUT CONTRAST TECHNIQUE: Multidetector CT imaging of the head, cervical spine, and maxillofacial structures were performed using the standard protocol without intravenous contrast. Multiplanar CT image reconstructions of the cervical spine and maxillofacial structures were also generated. COMPARISON:  CT of the head, cervical spine and maxillofacial structures 09/13/2020. FINDINGS: CT HEAD FINDINGS Brain: Cerebral volume is normal for age. There is no acute intracranial hemorrhage. No demarcated cortical infarct. No extra-axial fluid collection. No evidence of an intracranial mass. No midline shift. Vascular: No hyperdense vessel.  Atherosclerotic calcifications. Skull: Normal. Negative for fracture or focal lesion. Other: Left frontotemporal scalp hematoma. CT MAXILLOFACIAL FINDINGS Osseous: No evidence of acute maxillofacial fracture. Orbits: No acute or significant orbital finding. Sinuses: No significant paranasal sinus disease. Soft tissues: Left frontotemporal scalp hematoma. CT CERVICAL SPINE FINDINGS Alignment: Straightening of the expected cervical lordosis. Slight cervical levocurvature. No significant spondylolisthesis. Skull base and vertebrae: The basion-dental and atlanto-dental intervals  are maintained.No evidence of acute fracture to the cervical spine. Soft tissues and spinal canal: No prevertebral fluid or swelling. No visible canal hematoma. Disc levels: Cervical spondylosis with multilevel disc space narrowing, disc bulges/central disc protrusions, uncovertebral hypertrophy and facet arthrosis. Disc space narrowing is greatest at C6-C7 (moderate/advanced at this level). No appreciable high-grade spinal canal stenosis. No high-grade bony neural foraminal narrowing. Upper chest: Patchy ground-glass opacity within the imaged lung apices, nonspecific but possibly reflecting edema or infection. IMPRESSION: CT  head: 1. No evidence of acute intracranial abnormality. 2. Left frontotemporal scalp hematoma. CT maxillofacial: 1. No evidence of acute maxillofacial fracture. 2. Left frontotemporal scalp hematoma. CT cervical spine: 1. No evidence of acute fracture to the cervical spine 2. Straightening of the expected cervical lordosis. 3. Slight cervical levocurvature. 4. Cervical spondylosis, as described. 5. Patchy ground-glass opacity within the imaged lung apices, nonspecific but possibly reflecting edema or infection. Clinical correlation is recommended. Additionally, dedicated chest radiographs are recommended for further evaluation. Electronically Signed   By: Kellie Simmering D.O.   On: 11/22/2020 20:39   CT Cervical Spine Wo Contrast  Result Date: 11/22/2020 CLINICAL DATA:  Head trauma, minor. Facial trauma. Neck trauma. Additional history provided: Frequent falls. EXAM: CT HEAD WITHOUT CONTRAST CT MAXILLOFACIAL WITHOUT CONTRAST CT CERVICAL SPINE WITHOUT CONTRAST TECHNIQUE: Multidetector CT imaging of the head, cervical spine, and maxillofacial structures were performed using the standard protocol without intravenous contrast. Multiplanar CT image reconstructions of the cervical spine and maxillofacial structures were also generated. COMPARISON:  CT of the head, cervical spine and maxillofacial structures 09/13/2020. FINDINGS: CT HEAD FINDINGS Brain: Cerebral volume is normal for age. There is no acute intracranial hemorrhage. No demarcated cortical infarct. No extra-axial fluid collection. No evidence of an intracranial mass. No midline shift. Vascular: No hyperdense vessel.  Atherosclerotic calcifications. Skull: Normal. Negative for fracture or focal lesion. Other: Left frontotemporal scalp hematoma. CT MAXILLOFACIAL FINDINGS Osseous: No evidence of acute maxillofacial fracture. Orbits: No acute or significant orbital finding. Sinuses: No significant paranasal sinus disease. Soft tissues: Left frontotemporal scalp  hematoma. CT CERVICAL SPINE FINDINGS Alignment: Straightening of the expected cervical lordosis. Slight cervical levocurvature. No significant spondylolisthesis. Skull base and vertebrae: The basion-dental and atlanto-dental intervals are maintained.No evidence of acute fracture to the cervical spine. Soft tissues and spinal canal: No prevertebral fluid or swelling. No visible canal hematoma. Disc levels: Cervical spondylosis with multilevel disc space narrowing, disc bulges/central disc protrusions, uncovertebral hypertrophy and facet arthrosis. Disc space narrowing is greatest at C6-C7 (moderate/advanced at this level). No appreciable high-grade spinal canal stenosis. No high-grade bony neural foraminal narrowing. Upper chest: Patchy ground-glass opacity within the imaged lung apices, nonspecific but possibly reflecting edema or infection. IMPRESSION: CT head: 1. No evidence of acute intracranial abnormality. 2. Left frontotemporal scalp hematoma. CT maxillofacial: 1. No evidence of acute maxillofacial fracture. 2. Left frontotemporal scalp hematoma. CT cervical spine: 1. No evidence of acute fracture to the cervical spine 2. Straightening of the expected cervical lordosis. 3. Slight cervical levocurvature. 4. Cervical spondylosis, as described. 5. Patchy ground-glass opacity within the imaged lung apices, nonspecific but possibly reflecting edema or infection. Clinical correlation is recommended. Additionally, dedicated chest radiographs are recommended for further evaluation. Electronically Signed   By: Kellie Simmering D.O.   On: 11/22/2020 20:39   CT Thoracic Spine Wo Contrast  Result Date: 11/23/2020 CLINICAL DATA:  Initial evaluation for acute trauma, evaluate for spine fracture. EXAM: CT THORACIC SPINE WITHOUT CONTRAST TECHNIQUE: Multidetector CT images of the thoracic  were obtained using the standard protocol without intravenous contrast. COMPARISON:  Prior radiograph from earlier the same day. FINDINGS:  Alignment: Straightening of the normal thoracic kyphosis. No listhesis. Vertebrae: Compression deformity involving the superior endplate of 624THL with up to approximate 40-50% height loss and trace bony retropulsion, chronic in appearance. Minimal chronic height loss noted at the superior endplates of T3 and T4 as well. No acute fracture. Visualized ribs intact. No discrete or worrisome osseous lesions. Paraspinal and other soft tissues: Paraspinous soft tissues demonstrate no acute finding. Scattered atelectatic changes noted within the visualized lungs. Left-sided pacemaker/AICD in place, partially visualized. Left shoulder arthroplasty partially visualized. Aortic atherosclerosis. Disc levels: No significant disc pathology for patient age. No significant stenosis or neural impingement. IMPRESSION: 1. No acute traumatic injury within the thoracic spine. 2. Chronic compression deformity involving the superior endplate of 624THL with up to 40-50% height loss and trace bony retropulsion. 3. Minimal chronic height loss at the superior endplates of T3 and T4. Aortic Atherosclerosis (ICD10-I70.0). Electronically Signed   By: Jeannine Boga M.D.   On: 11/23/2020 01:05   CT Lumbar Spine Wo Contrast  Result Date: 11/23/2020 CLINICAL DATA:  Initial evaluation for acute low back pain status post trauma. EXAM: CT LUMBAR SPINE WITHOUT CONTRAST TECHNIQUE: Multidetector CT imaging of the lumbar spine was performed without intravenous contrast administration. Multiplanar CT image reconstructions were also generated. COMPARISON:  Prior radiograph from 11/22/2020. FINDINGS: Segmentation: Standard. Lowest well-formed disc space labeled the L5-S1 level. Alignment: Trace retrolisthesis of L1 on L2. Alignment otherwise normal with preservation of the normal lumbar lordosis. Vertebrae: There is an acute burst type compression fracture involving the L5 vertebral body. Associated mild central height loss of up to 35% without bony  retropulsion. There is an additional acute to subacute fracture of the right transverse process of L1 (series 3, image 35). No other acute fracture within the lumbar spine. Chronic T12 fracture noted, described on corresponding thoracic spine CT. Visualized sacrum and pelvis intact. SI joints symmetric and normal. Subcentimeter benign bone island noted within the left sacral ala. No other discrete or worrisome osseous lesions. Visualized lower ribs intact. Paraspinal and other soft tissues mild paraspinous edema adjacent to the L1 compression fracture. Paraspinous soft tissues demonstrate no other acute finding. Aorto bi-iliac atherosclerotic disease. Electrode for sacral stimulator noted. Disc levels: L1-2: Degenerative intervertebral disc space narrowing with diffuse disc bulge and disc desiccation. Mild facet hypertrophy. No spinal stenosis. Foramina remain patent. L2-3: Mild disc bulge, eccentric to the right. Mild facet hypertrophy. No significant spinal stenosis. Foramina remain patent. L3-4: Mild disc bulge, asymmetric to the right. Mild to moderate right worse than left facet hypertrophy. No significant spinal stenosis. Mild bilateral foraminal narrowing. L4-5: Mild disc bulge. Moderate facet hypertrophy. Mild narrowing of the lateral recesses bilaterally. Mild bilateral L4 foraminal stenosis. L5-S1: Degenerative intervertebral disc space narrowing with disc desiccation and diffuse disc bulge. Superimposed central to right subarticular disc protrusion closely approximates the descending S1 nerve roots, greater on the right. Mild facet hypertrophy probable mild right lateral recess stenosis. Central canal remains patent. Moderate bilateral foraminal narrowing. IMPRESSION: 1. Acute burst type compression fracture involving the L5 vertebral body with up to 35% central height loss without bony retropulsion. 2. Acute to subacute fracture of the right transverse process of L1. 3. No other acute traumatic injury  within the lumbar spine. 4. Central to right subarticular disc protrusion at L5-S1, closely approximating and potentially affecting either of the descending S1 nerve roots. 5. Chronic  T12 fracture, described on corresponding thoracic spine CT. Aortic Atherosclerosis (ICD10-I70.0). Electronically Signed   By: Jeannine Boga M.D.   On: 11/23/2020 01:16   US RENAL  Result Date: 11/23/2020 CLINICAL DATA:  Acute kidney injury. EXAM: RENAL / URINARY TRACT ULTRASOUND COMPLETE COMPARISON:  Report from abdominopelvic CT 06/08/2018, images not available FINDINGS: Right Kidney: Renal measurements: 9.9 x 5.6 x 5.0 cm = volume: 145 mL. Diffusely increased renal parenchymal echogenicity with cortical thinning. Areas of cortical scarring. There is mild hydronephrosis. No visualized renal stone or focal lesion. Left Kidney: Renal measurements: 10.1 x 6.8 x 5.5 cm = volume: 194 mL. Mild hydronephrosis. Borderline increased renal parenchymal echogenicity. Small cyst on prior CT is not well seen by ultrasound. No visualized stone or suspicious lesion. Bladder: Distended with bladder volume of 1040 cc. No bladder wall thickening or focal abnormality. Neither ureteral jet is seen. Other: None. IMPRESSION: 1. Distended urinary bladder with bladder volume of 1040 cc. Recommend correlation for bladder outlet obstruction. 2. Mild bilateral hydronephrosis may be secondary to bladder distension. 3. Increased right renal parenchyma echogenicity and areas of cortical scarring. Minimally increased left renal parenchymal echogenicity. Electronically Signed   By: Keith Rake M.D.   On: 11/23/2020 20:28   CT HIP LEFT WO CONTRAST  Result Date: 11/26/2020 CLINICAL DATA:  Acute left hip pain. EXAM: CT OF THE LEFT HIP WITHOUT CONTRAST TECHNIQUE: Multidetector CT imaging of the left hip was performed according to the standard protocol. Multiplanar CT image reconstructions were also generated. COMPARISON:  CT scan 06/08/2018 FINDINGS:  The left hip is normally located. Mild degenerative changes but no fracture or AVN. The visualized left hemipelvis bony structures are intact. No pubic rami fractures. The surrounding hip and pelvic musculature are grossly normal by CT. No obvious muscle tear/intramuscular hematoma. No significant intrapelvic abnormalities are identified. There is a Foley catheter noted in the bladder. IMPRESSION: 1. Mild degenerative changes involving the left hip but no fracture or AVN. 2. Grossly normal CT appearance of the surrounding hip and pelvic musculature. Electronically Signed   By: Marijo Sanes M.D.   On: 11/26/2020 08:13   DG Chest Portable 1 View  Result Date: 11/22/2020 CLINICAL DATA:  Hypoxia EXAM: PORTABLE CHEST 1 VIEW COMPARISON:  09/29/2020 FINDINGS: Lungs are clear. No pneumothorax or pleural effusion. Left subclavian dual lead pacemaker is unchanged. Cardiac size within normal limits. Pulmonary vascularity is normal. Right total shoulder arthroplasty has been performed. IMPRESSION: No active disease. Electronically Signed   By: Fidela Salisbury M.D.   On: 11/22/2020 21:21   ECHOCARDIOGRAM COMPLETE  Result Date: 11/24/2020    ECHOCARDIOGRAM REPORT   Patient Name:   LARAINA DEMAINE Date of Exam: 11/24/2020 Medical Rec #:  [id number]      Height:       60.0 in Accession #:    [id number]     Weight:       210.0 lb Date of Birth:  14-Jul-1945      BSA:          1.906 m Patient Age:    41 years       BP:           104/72 mmHg Patient Gender: F              HR:           83 bpm. Exam Location:  Inpatient Procedure: 2D Echo, Cardiac Doppler and Color Doppler Indications:    Syncope  History:  Patient has prior history of Echocardiogram examinations.  Sonographer:    Jyl Heinz Referring Phys: [id number] [place]  1. Left ventricular ejection fraction, by estimation, is 60 to 65%. The left ventricle has normal function. The left ventricle has no regional wall motion abnormalities. Left  ventricular diastolic parameters are consistent with Grade I diastolic dysfunction (impaired relaxation). Elevated left atrial pressure.  2. Right ventricular systolic function is normal. The right ventricular size is normal.  3. No evidence of mitral valve regurgitation.  4. The aortic valve is tricuspid. Aortic valve regurgitation is not visualized. Mild aortic valve sclerosis is present, with no evidence of aortic valve stenosis. FINDINGS  Left Ventricle: Left ventricular ejection fraction, by estimation, is 60 to 65%. The left ventricle has normal function. The left ventricle has no regional wall motion abnormalities. The left ventricular internal cavity size was normal in size. There is  no left ventricular hypertrophy. Left ventricular diastolic parameters are consistent with Grade I diastolic dysfunction (impaired relaxation). Elevated left atrial pressure. Right Ventricle: The right ventricular size is normal. No increase in right ventricular wall thickness. Right ventricular systolic function is normal. Left Atrium: Left atrial size was normal in size. Right Atrium: Right atrial size was normal in size. Pericardium: There is no evidence of pericardial effusion. Mitral Valve: Mild mitral annular calcification. No evidence of mitral valve regurgitation. MV peak gradient, 12.2 mmHg. The mean mitral valve gradient is 5.0 mmHg. Tricuspid Valve: The tricuspid valve is normal in structure. Tricuspid valve regurgitation is trivial. Aortic Valve: The aortic valve is tricuspid. Aortic valve regurgitation is not visualized. Mild aortic valve sclerosis is present, with no evidence of aortic valve stenosis. Aortic valve peak gradient measures 13.6 mmHg. Pulmonic Valve: The pulmonic valve was normal in structure. Pulmonic valve regurgitation is not visualized. Aorta: The aortic root and ascending aorta are structurally normal, with no evidence of dilitation. IAS/Shunts: No atrial level shunt detected by color flow  Doppler. Additional Comments: A device lead is visualized.  LEFT VENTRICLE PLAX 2D LVIDd:         3.95 cm      Diastology LVIDs:         2.51 cm      LV e' medial:    7.94 cm/s LV PW:         1.09 cm      LV E/e' medial:  17.8 LV IVS:        1.07 cm      LV e' lateral:   7.62 cm/s LVOT diam:     2.00 cm      LV E/e' lateral: 18.5 LV SV:         99 LV SV Index:   52 LVOT Area:     3.14 cm  LV Volumes (MOD) LV vol d, MOD A2C: 123.0 ml LV vol d, MOD A4C: 101.0 ml LV vol s, MOD A2C: 44.4 ml LV vol s, MOD A4C: 37.2 ml LV SV MOD A2C:     78.6 ml LV SV MOD A4C:     101.0 ml LV SV MOD BP:      71.1 ml RIGHT VENTRICLE RV Basal diam:  3.33 cm RV Mid diam:    3.30 cm RV S prime:     15.40 cm/s TAPSE (M-mode): 2.0 cm LEFT ATRIUM             Index        RIGHT ATRIUM  Index LA diam:        4.20 cm 2.20 cm/m   RA Area:     12.10 cm 6.35 cm/m LA Vol (A2C):   59.7 ml 31.33 ml/m LA Vol (A4C):   47.4 ml 24.87 ml/m LA Biplane Vol: 54.4 ml 28.55 ml/m  AORTIC VALVE AV Area (Vmax): 2.33 cm AV Vmax:        184.50 cm/s AV Peak Grad:   13.6 mmHg LVOT Vmax:      137.00 cm/s LVOT Vmean:     107.000 cm/s LVOT VTI:       0.314 m  AORTA Ao Root diam: 2.80 cm Ao Asc diam:  3.50 cm MITRAL VALVE                TRICUSPID VALVE MV Area (PHT): 3.65 cm     TR Peak grad:   38.7 mmHg MV Area VTI:   2.34 cm     TR Vmax:        311.00 cm/s MV Peak grad:  12.2 mmHg MV Mean grad:  5.0 mmHg     SHUNTS MV Vmax:       1.75 m/s     Systemic VTI:  0.31 m MV Vmean:      104.0 cm/s   Systemic Diam: 2.00 cm MV Decel Time: 208 msec MV E velocity: 141.00 cm/s MV A velocity: 161.00 cm/s MV E/A ratio:  0.88 Dorris Carnes MD Electronically signed by Dorris Carnes MD Signature Date/Time: 11/24/2020/1:38:22 PM    Final    CUP PACEART REMOTE DEVICE CHECK  Result Date: 11/17/2020 Scheduled remote reviewed. Normal device function.  10 NSVT, 1 fast AV.  EGM's show 1-2sec bursts of SVT Next remote 91 days. LR  CT Maxillofacial Wo Contrast  Result Date:  11/22/2020 CLINICAL DATA:  Head trauma, minor. Facial trauma. Neck trauma. Additional history provided: Frequent falls. EXAM: CT HEAD WITHOUT CONTRAST CT MAXILLOFACIAL WITHOUT CONTRAST CT CERVICAL SPINE WITHOUT CONTRAST TECHNIQUE: Multidetector CT imaging of the head, cervical spine, and maxillofacial structures were performed using the standard protocol without intravenous contrast. Multiplanar CT image reconstructions of the cervical spine and maxillofacial structures were also generated. COMPARISON:  CT of the head, cervical spine and maxillofacial structures 09/13/2020. FINDINGS: CT HEAD FINDINGS Brain: Cerebral volume is normal for age. There is no acute intracranial hemorrhage. No demarcated cortical infarct. No extra-axial fluid collection. No evidence of an intracranial mass. No midline shift. Vascular: No hyperdense vessel.  Atherosclerotic calcifications. Skull: Normal. Negative for fracture or focal lesion. Other: Left frontotemporal scalp hematoma. CT MAXILLOFACIAL FINDINGS Osseous: No evidence of acute maxillofacial fracture. Orbits: No acute or significant orbital finding. Sinuses: No significant paranasal sinus disease. Soft tissues: Left frontotemporal scalp hematoma. CT CERVICAL SPINE FINDINGS Alignment: Straightening of the expected cervical lordosis. Slight cervical levocurvature. No significant spondylolisthesis. Skull base and vertebrae: The basion-dental and atlanto-dental intervals are maintained.No evidence of acute fracture to the cervical spine. Soft tissues and spinal canal: No prevertebral fluid or swelling. No visible canal hematoma. Disc levels: Cervical spondylosis with multilevel disc space narrowing, disc bulges/central disc protrusions, uncovertebral hypertrophy and facet arthrosis. Disc space narrowing is greatest at C6-C7 (moderate/advanced at this level). No appreciable high-grade spinal canal stenosis. No high-grade bony neural foraminal narrowing. Upper chest: Patchy  ground-glass opacity within the imaged lung apices, nonspecific but possibly reflecting edema or infection. IMPRESSION: CT head: 1. No evidence of acute intracranial abnormality. 2. Left frontotemporal scalp hematoma. CT maxillofacial: 1. No evidence of acute maxillofacial fracture.  2. Left frontotemporal scalp hematoma. CT cervical spine: 1. No evidence of acute fracture to the cervical spine 2. Straightening of the expected cervical lordosis. 3. Slight cervical levocurvature. 4. Cervical spondylosis, as described. 5. Patchy ground-glass opacity within the imaged lung apices, nonspecific but possibly reflecting edema or infection. Clinical correlation is recommended. Additionally, dedicated chest radiographs are recommended for further evaluation. Electronically Signed   By: Jackey Loge D.O.   On: 11/22/2020 20:39    Subjective: She is alert, pain better controlled.    Discharge Exam: Vitals:   11/27/20 0438 11/27/20 0845  BP: (!) 161/69 (!) 160/96  Pulse: 65 98  Resp:  18  Temp: 97.8 F (36.6 C) 98.2 F (36.8 C)  SpO2: 98% 100%     General: Pt is alert, awake, not in acute distress Cardiovascular: RRR, S1/S2 +, no rubs, no gallops Respiratory: CTA bilaterally, no wheezing, no rhonchi Abdominal: Soft, NT, ND, bowel sounds + Extremities: no edema, no cyanosis    The results of significant diagnostics from this hospitalization (including imaging, microbiology, ancillary and laboratory) are listed below for reference.     Microbiology: Recent Results (from the past 240 hour(s))  Resp Panel by RT-PCR (Flu A&B, Covid) Nasopharyngeal Swab     Status: None   Collection Time: 11/22/20  8:56 PM   Specimen: Nasopharyngeal Swab; Nasopharyngeal(NP) swabs in vial transport medium  Result Value Ref Range Status   SARS Coronavirus 2 by RT PCR NEGATIVE NEGATIVE Final    Comment: (NOTE) SARS-CoV-2 target nucleic acids are NOT DETECTED.  The SARS-CoV-2 RNA is generally detectable in upper  respiratory specimens during the acute phase of infection. The lowest concentration of SARS-CoV-2 viral copies this assay can detect is 138 copies/mL. A negative result does not preclude SARS-Cov-2 infection and should not be used as the sole basis for treatment or other patient management decisions. A negative result may occur with  improper specimen collection/handling, submission of specimen other than nasopharyngeal swab, presence of viral mutation(s) within the areas targeted by this assay, and inadequate number of viral copies(<138 copies/mL). A negative result must be combined with clinical observations, patient history, and epidemiological information. The expected result is Negative.  Fact Sheet for Patients:  BloggerCourse.com  Fact Sheet for Healthcare Providers:  SeriousBroker.it  This test is no t yet approved or cleared by the [place] FDA and  has been authorized for detection and/or diagnosis of SARS-CoV-2 by FDA under an Emergency Use Authorization (EUA). This EUA will remain  in effect (meaning this test can be used) for the duration of the COVID-19 declaration under Section 564(b)(1) of the Act, 21 U.S.C.section 360bbb-3(b)(1), unless the authorization is terminated  or revoked sooner.       Influenza A by PCR NEGATIVE NEGATIVE Final   Influenza B by PCR NEGATIVE NEGATIVE Final    Comment: (NOTE) The Xpert Xpress SARS-CoV-2/FLU/RSV plus assay is intended as an aid in the diagnosis of influenza from Nasopharyngeal swab specimens and should not be used as a sole basis for treatment. Nasal washings and aspirates are unacceptable for Xpert Xpress SARS-CoV-2/FLU/RSV testing.  Fact Sheet for Patients: BloggerCourse.com  Fact Sheet for Healthcare Providers: SeriousBroker.it  This test is not yet approved or cleared by the [place] FDA and has been  authorized for detection and/or diagnosis of SARS-CoV-2 by FDA under an Emergency Use Authorization (EUA). This EUA will remain in effect (meaning this test can be used) for the duration of the COVID-19 declaration under Section 564(b)(1) of  the Act, 21 U.S.C. section 360bbb-3(b)(1), unless the authorization is terminated or revoked.  Performed at [facility], [address] [place], [place] [address]      Labs: BNP (last 3 results) Recent Labs    01/10/20 1604  BNP [id number]   Basic Metabolic Panel: Recent Labs  Lab 11/23/20 1609 11/24/20 0148 11/25/20 0154 11/26/20 0055 11/27/20 0109  NA 132* 136 136 135 134*  K 3.2* 3.7 3.5 4.1 4.4  CL 93* 101 102 101 101  CO2 27 25 25 27 27   GLUCOSE 63* 84 75 165* 216*  BUN 41* 28* 19 21 15   CREATININE 1.99* 1.61* 1.04* 0.98 0.88  CALCIUM 6.1* 6.6* 7.2* 7.5* 7.8*  MG 2.0  --   --   --   --    Liver Function Tests: Recent Labs  Lab 11/22/20 1932 11/23/20 1609 11/24/20 0148 11/25/20 0154  AST 50* 81* 47* 29  ALT 41 44 36 29  ALKPHOS 182* 207* 164* 147*  BILITOT 0.8 1.4* 1.0 1.1  PROT 7.1 7.1 5.8* 5.9*  ALBUMIN 3.3* 3.2* 2.5* 2.5*   No results for input(s): LIPASE, AMYLASE in the last 168 hours. Recent Labs  Lab 11/26/20 1009  AMMONIA 15   CBC: Recent Labs  Lab 11/22/20 1932 11/23/20 1609 11/24/20 0148 11/25/20 0154 11/26/20 0055 11/27/20 0109  WBC 6.4 5.7 5.6 6.1 6.5 5.5  NEUTROABS 5.0 4.2  --   --   --  4.6  HGB 11.0* 10.9* 10.2* 9.8* 9.5* 9.7*  HCT 32.5* 32.9* 30.4* 30.3* 28.4* 29.7*  MCV 87.6 90.1 88.1 91.3 90.4 91.1  PLT 243 224 191 216 234 257   Cardiac Enzymes: Recent Labs  Lab 11/22/20 2249  CKTOTAL 214   BNP: Invalid input(s): POCBNP CBG: Recent Labs  Lab 11/26/20 1206 11/26/20 1640 11/26/20 2031 11/27/20 0748 11/27/20 1234  GLUCAP 121* 134* 224* 116* 197*   D-Dimer No results for input(s): DDIMER in the last 72 hours. Hgb A1c No results for input(s): HGBA1C in the last 72  hours. Lipid Profile No results for input(s): CHOL, HDL, LDLCALC, TRIG, CHOLHDL, LDLDIRECT in the last 72 hours. Thyroid function studies No results for input(s): TSH, T4TOTAL, T3FREE, THYROIDAB in the last 72 hours.  Invalid input(s): FREET3 Anemia work up No results for input(s): VITAMINB12, FOLATE, FERRITIN, TIBC, IRON, RETICCTPCT in the last 72 hours. Urinalysis    Component Value Date/Time   COLORURINE YELLOW 11/23/2020 0301   APPEARANCEUR CLEAR 11/23/2020 0301   LABSPEC 1.010 11/23/2020 0301   PHURINE 5.5 11/23/2020 0301   GLUCOSEU NEGATIVE 11/23/2020 0301   HGBUR NEGATIVE 11/23/2020 0301   BILIRUBINUR NEGATIVE 11/23/2020 0301   KETONESUR NEGATIVE 11/23/2020 0301   PROTEINUR NEGATIVE 11/23/2020 0301   UROBILINOGEN 0.2 09/15/2012 1536   NITRITE NEGATIVE 11/23/2020 0301   LEUKOCYTESUR SMALL (A) 11/23/2020 0301   Sepsis Labs Invalid input(s): PROCALCITONIN,  WBC,  LACTICIDVEN Microbiology Recent Results (from the past 240 hour(s))  Resp Panel by RT-PCR (Flu A&B, Covid) Nasopharyngeal Swab     Status: None   Collection Time: 11/22/20  8:56 PM   Specimen: Nasopharyngeal Swab; Nasopharyngeal(NP) swabs in vial transport medium  Result Value Ref Range Status   SARS Coronavirus 2 by RT PCR NEGATIVE NEGATIVE Final    Comment: (NOTE) SARS-CoV-2 target nucleic acids are NOT DETECTED.  The SARS-CoV-2 RNA is generally detectable in upper respiratory specimens during the acute phase of infection. The lowest concentration of SARS-CoV-2 viral copies this assay can detect is  138 copies/mL. A negative result does not preclude SARS-Cov-2 infection and should not be used as the sole basis for treatment or other patient management decisions. A negative result may occur with  improper specimen collection/handling, submission of specimen other than nasopharyngeal swab, presence of viral mutation(s) within the areas targeted by this assay, and inadequate number of viral copies(<138  copies/mL). A negative result must be combined with clinical observations, patient history, and epidemiological information. The expected result is Negative.  Fact Sheet for Patients:  EntrepreneurPulse.com.au  Fact Sheet for Healthcare Providers:  IncredibleEmployment.be  This test is no t yet approved or cleared by the [place] FDA and  has been authorized for detection and/or diagnosis of SARS-CoV-2 by FDA under an Emergency Use Authorization (EUA). This EUA will remain  in effect (meaning this test can be used) for the duration of the COVID-19 declaration under Section 564(b)(1) of the Act, 21 U.S.C.section 360bbb-3(b)(1), unless the authorization is terminated  or revoked sooner.       Influenza A by PCR NEGATIVE NEGATIVE Final   Influenza B by PCR NEGATIVE NEGATIVE Final    Comment: (NOTE) The Xpert Xpress SARS-CoV-2/FLU/RSV plus assay is intended as an aid in the diagnosis of influenza from Nasopharyngeal swab specimens and should not be used as a sole basis for treatment. Nasal washings and aspirates are unacceptable for Xpert Xpress SARS-CoV-2/FLU/RSV testing.  Fact Sheet for Patients: EntrepreneurPulse.com.au  Fact Sheet for Healthcare Providers: IncredibleEmployment.be  This test is not yet approved or cleared by the [place] FDA and has been authorized for detection and/or diagnosis of SARS-CoV-2 by FDA under an Emergency Use Authorization (EUA). This EUA will remain in effect (meaning this test can be used) for the duration of the COVID-19 declaration under Section 564(b)(1) of the Act, 21 U.S.C. section 360bbb-3(b)(1), unless the authorization is terminated or revoked.  Performed at [facility], [address], [place], [place] [address]      Time coordinating discharge: 40 minutes  SIGNED:   Elmarie Shiley, MD  [facility]

## 2020-11-27 NOTE — Progress Notes (Signed)
Physical Therapy Treatment Patient Details Name: Jillian Leblanc MRN: [id number] DOB: 15-Jul-1945 Today's Date: 11/27/2020   History of Present Illness Pt is 75 yo female presenting with falls. CT Lumbar spine showed acute L5 compression fracture. She was noted to have AKI. She was noted to be hypoxic on RA.medical history significant of hypothyroidism, DM2, SSS, interstitial cystitis.    PT Comments    Pt demos improved tolerance and independence with bed mobility and sit > stand, min A only for proper form to follow log roll technique.  However, pt continues to c/o inc pain and LE heaviness in standing limiting ability to amb.  Pt only able to transfer over to chair with min A.  Pt would benefit from SNF to improve functional mobility, however, pt in insistent on returning home.  PT takes time to discuss home needs with pt to make sure that plan is as safe as possible.  In order to safely return home, pt would require w/c, total assist to ascend 1 step up into house with w/c, BSC.  Will need min A for transfers in and out of w/c and total assist to negotiate room to room in chair.  Pt believes that spouse can adequately support her in these needs.  PT to continue to follow acutely to inc balance, LE strength, and progress gait training as able.  Recommendations for follow up therapy are one component of a multi-disciplinary discharge planning process, led by the attending physician.  Recommendations may be updated based on patient status, additional functional criteria and insurance authorization.  Follow Up Recommendations  Other (comment) (Pt. could benefit from SNF placement, however, pt. preference is home with home health)     Assistance Recommended at Discharge    Equipment Recommendations  3in1 (PT);Wheelchair (measurements PT) (Pt. already has RW)    Recommendations for Other Services       Precautions / Restrictions Precautions Precautions: Back Required Braces or Orthoses: Spinal  Brace Spinal Brace: Thoracolumbosacral orthotic;Applied in sitting position     Mobility  Bed Mobility Overal bed mobility: Needs Assistance Bed Mobility: Rolling;Sidelying to Sit Rolling: Min guard Sidelying to sit: Min guard       General bed mobility comments: CGA for appropriate form with log rolling.  PT assists with donning TLSO at EOB. Patient Response: Cooperative  Transfers Overall transfer level: Modified independent Equipment used: Rolling walker (2 wheels) Transfers: Sit to/from [facility] Sit to Stand: Modified independent (Device/Increase time) Stand pivot transfers: Min assist         General transfer comment: Pt. performs sit > stand x 2 with mod I.  Demos safety with hand placement.  1st attempt she tries to take 2 steps away from bed but c/o LE heaviness and returns to sitting.  2nd attempt she completes transfer to chair with min A for safety.    Ambulation/Gait Ambulation/Gait assistance: Min assist Gait Distance (Feet): 1 Feet Assistive device: Rolling walker (2 wheels) Gait Pattern/deviations: Step-to pattern     General Gait Details: Slow cadence, only able to take a couple steps over to chair and then sits quickly with poor eccentric control.  Pt. insists on returning home.  Discussed need for w/c for safety with pt.   Stairs             Wheelchair Mobility    Modified Rankin (Stroke Patients Only)       Balance Overall balance assessment: Needs assistance         Standing balance  support: Bilateral upper extremity supported;During functional activity Standing balance-Leahy Scale: Poor                              Cognition Arousal/Alertness: Awake/alert Behavior During Therapy: WFL for tasks assessed/performed Overall Cognitive Status: Within Functional Limits for tasks assessed                                 General Comments: Pt. is supine in bed when PT arrives.  Agreeable to  work with PT.  Immediately states "I'm going home."        Exercises General Exercises - Lower Extremity Long Arc Quad: AROM;20 reps;Both Hip Flexion/Marching: AROM;Both;20 reps    General Comments General comments (skin integrity, edema, etc.): PT and pt have long discussion re: needs at home if pt. plans to return home.      Pertinent Vitals/Pain Pain Assessment: 0-10 Pain Score: 5  Breathing: normal Pain Location: Pt. reports pain in B hips; states that legs get "heavy and numb" when standing. Pain Descriptors / Indicators: Aching;Radiating;Heaviness;Numbness Pain Intervention(s): Limited activity within patient's tolerance;Monitored during session;Repositioned    Home Living                          Prior Function            PT Goals (current goals can now be found in the care plan section) Progress towards PT goals: Progressing toward goals    Frequency           PT Plan Current plan remains appropriate    Co-evaluation              AM-PAC PT "6 Clicks" Mobility   Outcome Measure  Help needed turning from your back to your side while in a flat bed without using bedrails?: A Little Help needed moving from lying on your back to sitting on the side of a flat bed without using bedrails?: A Little Help needed moving to and from a bed to a chair (including a wheelchair)?: A Little Help needed standing up from a chair using your arms (e.g., wheelchair or bedside chair)?: A Little Help needed to walk in hospital room?: A Lot Help needed climbing 3-5 steps with a railing? : Total 6 Click Score: 15    End of Session Equipment Utilized During Treatment: Gait belt Activity Tolerance: Patient limited by pain Patient left: in chair;with call bell/phone within reach;with chair alarm set         Time: 8341-9622 PT Time Calculation (min) (ACUTE ONLY): 32 min  Charges:  $Therapeutic Exercise: 8-22 mins $Therapeutic Activity: 8-22 mins                     Jillian Leblanc A. Lolamae Voisin, PT, DPT Acute Rehabilitation Services Office: [phone]    Eletha Culbertson A Ulmer Degen 11/27/2020, 12:38 PM

## 2020-11-27 NOTE — Care Management (Signed)
    Durable Medical Equipment  (From admission, onward)           Start     Ordered   11/27/20 1550  For home use only DME 3 n 1  Once        11/27/20 1550   11/27/20 1550  For home use only DME standard manual wheelchair with seat cushion  Once       Comments: Patient suffers from  Syncope,   Fall,   Compression of lumbar vertebra  which impairs their ability to perform daily activities like ambulating  in the home.  A cane  will not resolve issue with performing activities of daily living. A wheelchair will allow patient to safely perform daily activities. Patient can safely propel the wheelchair in the home or has a caregiver who can provide assistance. Length of need lifetime . Accessories: elevating leg rests (ELRs), wheel locks, extensions and anti-tippers.  Seat and back cushions   11/27/20 1550

## 2020-12-04 ENCOUNTER — Inpatient Hospital Stay (HOSPITAL_COMMUNITY)
Admission: EM | Admit: 2020-12-04 | Discharge: 2020-12-09 | DRG: 683 | Disposition: A | Discharge status: Skilled Nursing Facility | Payer: Medicare Other | Attending: Internal Medicine | Admitting: Internal Medicine

## 2020-12-04 ENCOUNTER — Emergency Department (HOSPITAL_COMMUNITY): Payer: Medicare Other

## 2020-12-04 ENCOUNTER — Other Ambulatory Visit: Payer: Self-pay

## 2020-12-04 ENCOUNTER — Inpatient Hospital Stay (HOSPITAL_COMMUNITY): Payer: Medicare Other

## 2020-12-04 ENCOUNTER — Encounter (HOSPITAL_COMMUNITY): Payer: Self-pay

## 2020-12-04 DIAGNOSIS — R339 Retention of urine, unspecified: Secondary | ICD-10-CM | POA: Diagnosis present

## 2020-12-04 DIAGNOSIS — S22089D Unspecified fracture of T11-T12 vertebra, subsequent encounter for fracture with routine healing: Secondary | ICD-10-CM

## 2020-12-04 DIAGNOSIS — Z20822 Contact with and (suspected) exposure to covid-19: Secondary | ICD-10-CM | POA: Diagnosis present

## 2020-12-04 DIAGNOSIS — W19XXXD Unspecified fall, subsequent encounter: Secondary | ICD-10-CM | POA: Diagnosis present

## 2020-12-04 DIAGNOSIS — Z79890 Hormone replacement therapy (postmenopausal): Secondary | ICD-10-CM | POA: Diagnosis not present

## 2020-12-04 DIAGNOSIS — R9431 Abnormal electrocardiogram [ECG] [EKG]: Secondary | ICD-10-CM | POA: Diagnosis not present

## 2020-12-04 DIAGNOSIS — Z7982 Long term (current) use of aspirin: Secondary | ICD-10-CM | POA: Diagnosis not present

## 2020-12-04 DIAGNOSIS — E1169 Type 2 diabetes mellitus with other specified complication: Secondary | ICD-10-CM | POA: Diagnosis present

## 2020-12-04 DIAGNOSIS — E1122 Type 2 diabetes mellitus with diabetic chronic kidney disease: Secondary | ICD-10-CM | POA: Diagnosis present

## 2020-12-04 DIAGNOSIS — E890 Postprocedural hypothyroidism: Secondary | ICD-10-CM | POA: Diagnosis present

## 2020-12-04 DIAGNOSIS — Z888 Allergy status to other drugs, medicaments and biological substances status: Secondary | ICD-10-CM

## 2020-12-04 DIAGNOSIS — I495 Sick sinus syndrome: Secondary | ICD-10-CM | POA: Diagnosis present

## 2020-12-04 DIAGNOSIS — N3010 Interstitial cystitis (chronic) without hematuria: Secondary | ICD-10-CM | POA: Diagnosis present

## 2020-12-04 DIAGNOSIS — G8929 Other chronic pain: Secondary | ICD-10-CM | POA: Diagnosis present

## 2020-12-04 DIAGNOSIS — E871 Hypo-osmolality and hyponatremia: Secondary | ICD-10-CM | POA: Diagnosis present

## 2020-12-04 DIAGNOSIS — N179 Acute kidney failure, unspecified: Secondary | ICD-10-CM | POA: Diagnosis present

## 2020-12-04 DIAGNOSIS — E669 Obesity, unspecified: Secondary | ICD-10-CM | POA: Diagnosis present

## 2020-12-04 DIAGNOSIS — E11649 Type 2 diabetes mellitus with hypoglycemia without coma: Secondary | ICD-10-CM | POA: Diagnosis present

## 2020-12-04 DIAGNOSIS — Z79899 Other long term (current) drug therapy: Secondary | ICD-10-CM

## 2020-12-04 DIAGNOSIS — Z95810 Presence of automatic (implantable) cardiac defibrillator: Secondary | ICD-10-CM

## 2020-12-04 DIAGNOSIS — N183 Chronic kidney disease, stage 3 unspecified: Secondary | ICD-10-CM | POA: Diagnosis present

## 2020-12-04 DIAGNOSIS — Z6841 Body Mass Index (BMI) 40.0 and over, adult: Secondary | ICD-10-CM

## 2020-12-04 DIAGNOSIS — E872 Acidosis, unspecified: Secondary | ICD-10-CM | POA: Diagnosis present

## 2020-12-04 DIAGNOSIS — E861 Hypovolemia: Secondary | ICD-10-CM | POA: Diagnosis present

## 2020-12-04 DIAGNOSIS — L89159 Pressure ulcer of sacral region, unspecified stage: Secondary | ICD-10-CM | POA: Diagnosis present

## 2020-12-04 DIAGNOSIS — E039 Hypothyroidism, unspecified: Secondary | ICD-10-CM | POA: Diagnosis present

## 2020-12-04 DIAGNOSIS — Z96611 Presence of right artificial shoulder joint: Secondary | ICD-10-CM | POA: Diagnosis present

## 2020-12-04 DIAGNOSIS — Z7984 Long term (current) use of oral hypoglycemic drugs: Secondary | ICD-10-CM

## 2020-12-04 DIAGNOSIS — S32000A Wedge compression fracture of unspecified lumbar vertebra, initial encounter for closed fracture: Secondary | ICD-10-CM | POA: Diagnosis present

## 2020-12-04 LAB — HEPATIC FUNCTION PANEL
ALT: 28 U/L (ref 0–44)
AST: 36 U/L (ref 15–41)
Albumin: 3.1 g/dL — ABNORMAL LOW (ref 3.5–5.0)
Alkaline Phosphatase: 182 U/L — ABNORMAL HIGH (ref 38–126)
Bilirubin, Direct: 0.2 mg/dL (ref 0.0–0.2)
Indirect Bilirubin: 0.7 mg/dL (ref 0.3–0.9)
Total Bilirubin: 0.9 mg/dL (ref 0.3–1.2)
Total Protein: 7.1 g/dL (ref 6.5–8.1)

## 2020-12-04 LAB — URINALYSIS, ROUTINE W REFLEX MICROSCOPIC
Bilirubin Urine: NEGATIVE
Glucose, UA: NEGATIVE mg/dL
Ketones, ur: NEGATIVE mg/dL
Nitrite: NEGATIVE
Protein, ur: NEGATIVE mg/dL
Specific Gravity, Urine: 1.006 (ref 1.005–1.030)
pH: 5 (ref 5.0–8.0)

## 2020-12-04 LAB — RESP PANEL BY RT-PCR (FLU A&B, COVID) ARPGX2
Influenza A by PCR: NEGATIVE
Influenza B by PCR: NEGATIVE
SARS Coronavirus 2 by RT PCR: NEGATIVE

## 2020-12-04 LAB — CBC WITH DIFFERENTIAL/PLATELET
Abs Immature Granulocytes: 0.03 10*3/uL (ref 0.00–0.07)
Basophils Absolute: 0 10*3/uL (ref 0.0–0.1)
Basophils Relative: 0 %
Eosinophils Absolute: 0 10*3/uL (ref 0.0–0.5)
Eosinophils Relative: 0 %
HCT: 33.5 % — ABNORMAL LOW (ref 36.0–46.0)
Hemoglobin: 11.4 g/dL — ABNORMAL LOW (ref 12.0–15.0)
Immature Granulocytes: 0 %
Lymphocytes Relative: 10 %
Lymphs Abs: 0.8 10*3/uL (ref 0.7–4.0)
MCH: 29.8 pg (ref 26.0–34.0)
MCHC: 34 g/dL (ref 30.0–36.0)
MCV: 87.5 fL (ref 80.0–100.0)
Monocytes Absolute: 0.5 10*3/uL (ref 0.1–1.0)
Monocytes Relative: 5 %
Neutro Abs: 7.5 10*3/uL (ref 1.7–7.7)
Neutrophils Relative %: 85 %
Platelets: 460 10*3/uL — ABNORMAL HIGH (ref 150–400)
RBC: 3.83 MIL/uL — ABNORMAL LOW (ref 3.87–5.11)
RDW: 14.6 % (ref 11.5–15.5)
WBC: 8.9 10*3/uL (ref 4.0–10.5)
nRBC: 0 % (ref 0.0–0.2)

## 2020-12-04 LAB — BASIC METABOLIC PANEL
Anion gap: 20 — ABNORMAL HIGH (ref 5–15)
BUN: 52 mg/dL — ABNORMAL HIGH (ref 8–23)
CO2: 20 mmol/L — ABNORMAL LOW (ref 22–32)
Calcium: 6.1 mg/dL — CL (ref 8.9–10.3)
Chloride: 82 mmol/L — ABNORMAL LOW (ref 98–111)
Creatinine, Ser: 3.71 mg/dL — ABNORMAL HIGH (ref 0.44–1.00)
GFR, Estimated: 12 mL/min — ABNORMAL LOW (ref >60)
Glucose, Bld: 71 mg/dL (ref 70–99)
Potassium: 3.6 mmol/L (ref 3.5–5.1)
Sodium: 122 mmol/L — ABNORMAL LOW (ref 135–145)

## 2020-12-04 LAB — PHOSPHORUS: Phosphorus: 7.7 mg/dL — ABNORMAL HIGH (ref 2.5–4.6)

## 2020-12-04 LAB — MAGNESIUM: Magnesium: 1.5 mg/dL — ABNORMAL LOW (ref 1.7–2.4)

## 2020-12-04 LAB — CBG MONITORING, ED
Glucose-Capillary: 58 mg/dL — ABNORMAL LOW (ref 70–99)
Glucose-Capillary: 107 mg/dL — ABNORMAL HIGH (ref 70–99)

## 2020-12-04 LAB — LACTIC ACID, PLASMA
Lactic Acid, Venous: 2.4 mmol/L (ref 0.5–1.9)
Lactic Acid, Venous: 2.2 mmol/L (ref 0.5–1.9)

## 2020-12-04 LAB — VITAMIN D 25 HYDROXY (VIT D DEFICIENCY, FRACTURES): Vit D, 25-Hydroxy: 22.38 ng/mL — ABNORMAL LOW (ref 30–100)

## 2020-12-04 MED ORDER — MAGNESIUM SULFATE 2 GM/50ML IV SOLN
2.0000 g | Freq: Once | INTRAVENOUS | Status: AC
  Administered 2020-12-04: 2 g via INTRAVENOUS
  Filled 2020-12-04: qty 50

## 2020-12-04 MED ORDER — SODIUM CHLORIDE 0.9 % IV SOLN: INTRAVENOUS | Status: AC

## 2020-12-04 MED ORDER — HEPARIN SODIUM (PORCINE) 5000 UNIT/ML IJ SOLN
5000.0000 [IU] | Freq: Three times a day (TID) | INTRAMUSCULAR | Status: DC
  Administered 2020-12-04 – 2020-12-09 (×15): 5000 [IU] via SUBCUTANEOUS
  Filled 2020-12-04 (×14): qty 1

## 2020-12-04 MED ORDER — HYDROMORPHONE HCL 1 MG/ML IJ SOLN
1.0000 mg | Freq: Once | INTRAMUSCULAR | Status: AC
  Administered 2020-12-04: 1 mg via INTRAVENOUS
  Filled 2020-12-04: qty 1

## 2020-12-04 MED ORDER — INSULIN ASPART 100 UNIT/ML IJ SOLN
0.0000 [IU] | Freq: Three times a day (TID) | INTRAMUSCULAR | Status: DC
  Administered 2020-12-07 – 2020-12-09 (×5): 1 [IU] via SUBCUTANEOUS

## 2020-12-04 MED ORDER — ACETAMINOPHEN 325 MG PO TABS
650.0000 mg | ORAL_TABLET | Freq: Four times a day (QID) | ORAL | Status: DC | PRN
  Administered 2020-12-06 – 2020-12-07 (×2): 650 mg via ORAL
  Filled 2020-12-04 (×2): qty 2

## 2020-12-04 MED ORDER — SODIUM CHLORIDE 0.9 % IV BOLUS
1000.0000 mL | Freq: Once | INTRAVENOUS | Status: AC
  Administered 2020-12-04: 1000 mL via INTRAVENOUS

## 2020-12-04 MED ORDER — ACETAMINOPHEN 650 MG RE SUPP: 650.0000 mg | Freq: Four times a day (QID) | RECTAL | Status: DC | PRN

## 2020-12-04 MED ORDER — CALCIUM GLUCONATE-NACL 1-0.675 GM/50ML-% IV SOLN
1.0000 g | Freq: Once | INTRAVENOUS | Status: AC
  Administered 2020-12-04: 1000 mg via INTRAVENOUS
  Filled 2020-12-04: qty 50

## 2020-12-04 MED ORDER — SODIUM CHLORIDE 0.9% FLUSH
3.0000 mL | Freq: Two times a day (BID) | INTRAVENOUS | Status: DC
  Administered 2020-12-04 – 2020-12-09 (×9): 3 mL via INTRAVENOUS

## 2020-12-04 MED ORDER — HYDROMORPHONE HCL 1 MG/ML IJ SOLN
0.5000 mg | INTRAMUSCULAR | Status: DC | PRN
  Administered 2020-12-06 (×2): 0.5 mg via INTRAVENOUS
  Filled 2020-12-04 (×2): qty 1

## 2020-12-04 MED ORDER — OXYCODONE HCL 5 MG PO TABS
5.0000 mg | ORAL_TABLET | ORAL | Status: DC | PRN
  Administered 2020-12-05 – 2020-12-08 (×8): 5 mg via ORAL
  Filled 2020-12-04 (×8): qty 1

## 2020-12-04 MED ORDER — LEVOTHYROXINE SODIUM 75 MCG PO TABS
150.0000 ug | ORAL_TABLET | Freq: Every day | ORAL | Status: DC
  Administered 2020-12-05 – 2020-12-09 (×5): 150 ug via ORAL
  Filled 2020-12-04 (×5): qty 2

## 2020-12-04 MED ORDER — GABAPENTIN 100 MG PO CAPS
100.0000 mg | ORAL_CAPSULE | Freq: Every day | ORAL | Status: DC
Start: 2020-12-04 — End: 2020-12-09
  Administered 2020-12-04 – 2020-12-08 (×5): 100 mg via ORAL
  Filled 2020-12-04 (×5): qty 1

## 2020-12-04 MED ORDER — OXYCODONE-ACETAMINOPHEN 5-325 MG PO TABS
1.0000 | ORAL_TABLET | Freq: Once | ORAL | Status: AC
  Administered 2020-12-04: 1 via ORAL
  Filled 2020-12-04: qty 1

## 2020-12-04 NOTE — ED Notes (Signed)
CBG 58, obtaining IV access for dextrose delivery

## 2020-12-04 NOTE — H&P (Signed)
History and Physical    Areen Asaro [id number] DOB: [birth year] DOA: 12/04/2020  PCP: Guadlupe Spanish, MD   Patient coming from: Home   Chief Complaint: Decreased appetite, decreased urine output, increased pain   HPI: Markee Mclendon is a pleasant 75 y.o. female with medical history significant for type 2 diabetes mellitus, interstitial cystitis, hypothyroidism, chronic pain, and recent admission with vertebral compression fracture managed nonoperatively and AKI with urinary retention, now returning to the emergency department with decreased appetite, poor urine output, and increased chronic pain.  During the recent admission, the patient was seen by neurosurgery with regard to her vertebral compression fracture and has been managed nonoperatively.  She had Foley catheter placed during the recent admission after imaging demonstrated bilateral hydronephrosis and distended urinary bladder, was also given IV fluids, and kidney function normalized.  Over the past couple days, she has had very poor appetite and has only had 800 cc of urine output.  She denies any vomiting or diarrhea, denies abdominal pain, but states that she has had no appetite since leaving the hospital.  She had a follow-up appointment scheduled with urology but missed it.  She denies chest pain, cough, fevers, or chills.  ED Course: Upon arrival to the ED, patient is found to be afebrile, saturating mid 90s, and with stable blood pressure.  EKG features sinus rhythm with QTC 511 ms.  Chemistry panel most notable for sodium 122, calcium 6.1, BUN 52, and creatinine 3.71.  Lactic acid is 2.4.  Renal ultrasound demonstrates resolution of recent hydronephrosis.  Chest x-ray negative for acute findings and no acute osseous abnormality on radiographs of the right hip, pelvis, and right foot.  Patient was given a gram of IV calcium, Dilaudid, Percocet, and a liter of saline in the ED.  Review of Systems:  All other systems reviewed  and apart from HPI, are negative.  Past Medical History:  Diagnosis Date   Diabetes mellitus without complication ([place])    Interstitial cystitis    Thyroid disease     Past Surgical History:  Procedure Laterality Date   ABDOMINAL HYSTERECTOMY     CHOLECYSTECTOMY     NECK SURGERY     PACEMAKER IMPLANT N/A 08/16/2019   Procedure: PACEMAKER IMPLANT;  Surgeon: Constance Haw, MD;  Location: [facility];  Service: Cardiovascular;  Laterality: N/A;   REVERSE SHOULDER ARTHROPLASTY Right 07/28/2020   Procedure: REVERSE SHOULDER ARTHROPLASTY;  Surgeon: Justice Britain, MD;  Location: [facility];  Service: Orthopedics;  Laterality: Right;   SHOULDER SURGERY     THYROIDECTOMY      Social History:   reports that she has never smoked. She has never used smokeless tobacco. She reports that she does not drink alcohol and does not use drugs.  Allergies  Allergen Reactions   Pentosan Polysulfate Sodium     Other reaction(s): Other (See Comments) ELMIRON Y Drug hair fell out 10/21/2009 12:00:00 AM by [name] CNA 1   Elmiron [Pentosan Polysulfate]    Naloxone Other (See Comments)    Hallucinations Confusion Nightmares    History reviewed. No pertinent family history.   Prior to Admission medications   Medication Sig Start Date End Date Taking? Authorizing Provider  ASPIRIN LOW DOSE 81 MG EC tablet Take 81 mg by mouth daily. 06/16/19   [provider]  calcium-vitamin D 250-100 MG-UNIT tablet Take 1 tablet by mouth 2 (two) times daily. Patient not taking: No sig reported 09/14/20   Randal Buba, April, MD  colchicine  0.6 MG tablet Take 1 tablet (0.6 mg total) by mouth daily. 11/27/20   Regalado, Belkys A, MD  diclofenac Sodium (VOLTAREN) 1 % GEL Apply 2 g topically 4 (four) times daily. 11/27/20   Regalado, Belkys A, MD  feeding supplement (ENSURE ENLIVE / ENSURE PLUS) LIQD Take 237 mLs by mouth 2 (two) times daily between meals. 11/27/20   Regalado, Jerald Kief A, MD  ferrous sulfate  325 (65 FE) MG tablet Take 1 tablet (325 mg total) by mouth daily with breakfast. 07/29/20   Amin, Jeanella Flattery, MD  gabapentin (NEURONTIN) 300 MG capsule Take 1 capsule (300 mg total) by mouth 2 (two) times daily. 11/27/20   Regalado, Belkys A, MD  glipiZIDE (GLUCOTROL XL) 2.5 MG 24 hr tablet Take 1 tablet (2.5 mg total) by mouth daily with breakfast. 11/27/20   Regalado, Belkys A, MD  hydrOXYzine (ATARAX/VISTARIL) 25 MG tablet Take 25 mg by mouth at bedtime as needed for anxiety. 06/14/19   [provider]  levothyroxine (SYNTHROID) 150 MCG tablet Take 150 mcg by mouth daily. 09/01/20   [provider]  nystatin cream (MYCOSTATIN) Apply 1 application topically 2 (two) times daily as needed for dry skin. 11/16/20   [provider]  ondansetron (ZOFRAN-ODT) 4 MG disintegrating tablet Take 4 mg by mouth every 8 (eight) hours as needed for nausea or vomiting. 09/02/20   [provider]  polyethylene glycol (MIRALAX / GLYCOLAX) 17 g packet Take 17 g by mouth 2 (two) times daily. 11/27/20   Regalado, Belkys A, MD  pramipexole (MIRAPEX) 1.5 MG tablet Take 1.5 mg by mouth 3 (three) times daily. 06/29/20   [provider]    Physical Exam: Vitals:   12/04/20 1730 12/04/20 1742 12/04/20 1830 12/04/20 1915  BP: (!) 112/58  119/69 (!) 127/104  Pulse: 79  71 79  Resp: 14  19 (!) 26  Temp:      TempSrc:      SpO2: 93%  96% 99%  Weight:  96 kg    Height:  5' (1.524 m)      Constitutional: NAD, calm  Eyes: PERTLA, lids and conjunctivae normal ENMT: Mucous membranes are moist. Posterior pharynx clear of any exudate or lesions.   Neck: supple, no masses  Respiratory:  no wheezing, no crackles. No accessory muscle use.  Cardiovascular: S1 & S2 heard, regular rate and rhythm. Trace pedal edema.   Abdomen: No distension, no tenderness, soft. Bowel sounds active.  Musculoskeletal: no clubbing / cyanosis. No joint deformity upper and lower extremities.   Skin: Crusted  lesion near left temple. Poor turgor. Warm, dry, well-perfused. Neurologic: CN 2-12 grossly intact. Moving all extremities. Alert and oriented.  Psychiatric: Calm. Cooperative.    Labs and Imaging on Admission: I have personally reviewed following labs and imaging studies  CBC: Recent Labs  Lab 12/04/20 1734  WBC 8.9  NEUTROABS 7.5  HGB 11.4*  HCT 33.5*  MCV 87.5  PLT 123456*   Basic Metabolic Panel: Recent Labs  Lab 12/04/20 1734  NA 122*  K 3.6  CL 82*  CO2 20*  GLUCOSE 71  BUN 52*  CREATININE 3.71*  CALCIUM 6.1*   GFR: Estimated Creatinine Clearance: 14 mL/min (A) (by C-G formula based on SCr of 3.71 mg/dL (H)). Liver Function Tests: Recent Labs  Lab 12/04/20 1734  AST 36  ALT 28  ALKPHOS 182*  BILITOT 0.9  PROT 7.1  ALBUMIN 3.1*   No results for input(s): LIPASE, AMYLASE in the last 168  hours. No results for input(s): AMMONIA in the last 168 hours. Coagulation Profile: No results for input(s): INR, PROTIME in the last 168 hours. Cardiac Enzymes: No results for input(s): CKTOTAL, CKMB, CKMBINDEX, TROPONINI in the last 168 hours. BNP (last 3 results) No results for input(s): PROBNP in the last 8760 hours. HbA1C: No results for input(s): HGBA1C in the last 72 hours. CBG: Recent Labs  Lab 12/04/20 1717 12/04/20 1835  GLUCAP 58* 107*   Lipid Profile: No results for input(s): CHOL, HDL, LDLCALC, TRIG, CHOLHDL, LDLDIRECT in the last 72 hours. Thyroid Function Tests: No results for input(s): TSH, T4TOTAL, FREET4, T3FREE, THYROIDAB in the last 72 hours. Anemia Panel: No results for input(s): VITAMINB12, FOLATE, FERRITIN, TIBC, IRON, RETICCTPCT in the last 72 hours. Urine analysis:    Component Value Date/Time   COLORURINE YELLOW 11/23/2020 0301   APPEARANCEUR CLEAR 11/23/2020 0301   LABSPEC 1.010 11/23/2020 0301   PHURINE 5.5 11/23/2020 0301   GLUCOSEU NEGATIVE 11/23/2020 0301   HGBUR NEGATIVE 11/23/2020 0301   BILIRUBINUR NEGATIVE 11/23/2020 0301    KETONESUR NEGATIVE 11/23/2020 0301   PROTEINUR NEGATIVE 11/23/2020 0301   UROBILINOGEN 0.2 09/15/2012 1536   NITRITE NEGATIVE 11/23/2020 0301   LEUKOCYTESUR SMALL (A) 11/23/2020 0301   Sepsis Labs: @LABRCNTIP (procalcitonin:4,lacticidven:4) ) Recent Results (from the past 240 hour(s))  Resp Panel by RT-PCR (Flu A&B, Covid) Nasopharyngeal Swab     Status: None   Collection Time: 12/04/20  6:24 PM   Specimen: Nasopharyngeal Swab; Nasopharyngeal(NP) swabs in vial transport medium  Result Value Ref Range Status   SARS Coronavirus 2 by RT PCR NEGATIVE NEGATIVE Final    Comment: (NOTE) SARS-CoV-2 target nucleic acids are NOT DETECTED.  The SARS-CoV-2 RNA is generally detectable in upper respiratory specimens during the acute phase of infection. The lowest concentration of SARS-CoV-2 viral copies this assay can detect is 138 copies/mL. A negative result does not preclude SARS-Cov-2 infection and should not be used as the sole basis for treatment or other patient management decisions. A negative result may occur with  improper specimen collection/handling, submission of specimen other than nasopharyngeal swab, presence of viral mutation(s) within the areas targeted by this assay, and inadequate number of viral copies(<138 copies/mL). A negative result must be combined with clinical observations, patient history, and epidemiological information. The expected result is Negative.  Fact Sheet for Patients:  13/11/22  Fact Sheet for Healthcare Providers:  BloggerCourse.com  This test is no t yet approved or cleared by the [employer] FDA and  has been authorized for detection and/or diagnosis of SARS-CoV-2 by FDA under an Emergency Use Authorization (EUA). This EUA will remain  in effect (meaning this test can be used) for the duration of the COVID-19 declaration under Section 564(b)(1) of the Act, 21 U.S.C.section  360bbb-3(b)(1), unless the authorization is terminated  or revoked sooner.       Influenza A by PCR NEGATIVE NEGATIVE Final   Influenza B by PCR NEGATIVE NEGATIVE Final    Comment: (NOTE) The Xpert Xpress SARS-CoV-2/FLU/RSV plus assay is intended as an aid in the diagnosis of influenza from Nasopharyngeal swab specimens and should not be used as a sole basis for treatment. Nasal washings and aspirates are unacceptable for Xpert Xpress SARS-CoV-2/FLU/RSV testing.  Fact Sheet for Patients: [place]  Fact Sheet for Healthcare Providers: BloggerCourse.com  This test is not yet approved or cleared by the [employer] FDA and has been authorized for detection and/or diagnosis of SARS-CoV-2 by FDA under an Emergency Use Authorization (EUA).  This EUA will remain in effect (meaning this test can be used) for the duration of the COVID-19 declaration under Section 564(b)(1) of the Act, 21 U.S.C. section 360bbb-3(b)(1), unless the authorization is terminated or revoked.  Performed at [facility], [place] [address] [place], [place] [address]      Radiological Exams on Admission: US Renal  Result Date: 12/04/2020 CLINICAL DATA:  Decreased urine output EXAM: RENAL / URINARY TRACT ULTRASOUND COMPLETE COMPARISON:  None. FINDINGS: Right Kidney: Renal measurements: 9.2 x 5.1 x 4.4 cm = volume: 108 mL. There is moderate renal cortical atrophy and cortical scarring, similar to that noted on prior examination. Diffusely increased renal cortical echogenicity appears stable, in keeping with underlying medical renal disease. Previously noted hydronephrosis has resolved with mild residual fullness of the right renal pelvis noted. No intrarenal masses or calcifications are seen. No perinephric fluid collections are seen. Left Kidney: Renal measurements: 11.1 x 6.0 x 6.4 cm = volume: 221 mL. Mild left renal cortical thinning and borderline  cortical hyperechogenicity is unchanged. No hydronephrosis. No intrarenal masses or calcifications are seen. Bladder: The bladder is decompressed Other: None. IMPRESSION: Interval bladder decompression with resolution of bilateral hydronephrosis. Stable cortical hyperechogenicity, asymmetrically more severe on the right, suggesting changes of underlying medical renal disease. Stable bilateral renal cortical atrophy, asymmetrically more severe on the right, with superimposed areas of cortical scarring on the right. Electronically Signed   By: Fidela Salisbury M.D.   On: 12/04/2020 19:52    EKG: Independently reviewed. Sinus rhythm, LAD, late R-transition, QTc 511 ms.   Assessment/Plan  1. AKI - Presents with poor appetite and decreased UOP and found to have SCr of 3.71, up from 0.88 a week earlier  - RUS with changes of medical renal disease and resolution of recent b/l hydronephrosis - Likely prerenal azotemia in setting of anorexia  - Continue IVF hydration, renally-dose medications, monitor    2. Hyponatremia  - Serum sodium 122 in setting of hypovolemia and acute renal insufficiency  - She was given a liter of NS in ED  - Continue IVF hydration and monitor with serial chem panels    3. Hypoglycemia; type II DM  - CBG was 51 in ED  - Due to glipizide use with poor appetite and AKI  - Hold glipizide, monitor glucose    4. Prolonged QT interval  - QTc 511 ms in ED  - Continue cardiac monitoring, correct electrolyte derangements, avoid QT-prolonging medications    5. Hypocalcemia  - IV calcium given in ED, mag and PTH levels pending    6. Chronic pain; vertebral compression fracture   - Pt has chronic pain and recent compression fx managed conservatively  - Database reviewed, continue home medications    7. Urinary retention  - Foley present on admission  - Continue Foley care, urology follow-up    DVT prophylaxis: sq heparin  Code Status: Full  Level of Care: Level of care:  Telemetry Medical Family Communication: None present   Disposition Plan:  [name] from: home  Anticipated d/c is to: TBD Anticipated d/c date is: 12/07/20 Patient currently: Pending improvement in renal function, tolerance of adequate oral intake  Consults called: none  Admission status: Inpatient     Vianne Bulls, MD [facility]  12/04/2020, 8:01 PM

## 2020-12-04 NOTE — ED Notes (Signed)
Pt placed on 2L  for support after med administration.

## 2020-12-04 NOTE — ED Triage Notes (Addendum)
Pt BIB [facility] from home for eval of decreased UOP x 2 days. Family reports decreased appetite, generalized pain and weakness. Pt w/ approx 800 in bag which family reports hasnt been emptied in 2 days.

## 2020-12-04 NOTE — ED Provider Notes (Addendum)
[facility] EMERGENCY DEPARTMENT Provider Note   CSN: [id number] Arrival date & time: 12/04/20  1708     History Chief Complaint  Patient presents with   Decreased Urine Output    Jillian Leblanc is a 75 y.o. female.  HPI  75 year old female with medical history significant for hypothyroidism, DM 2, sick sinus syndrome status post pacemaker, history of interstitial cystitis, frequent falls, recent acute L5 compression fracture that is managed conservatively and nonsurgically, recent AKI with a creatinine of 1.9, recent hospitalization during which time she had a renal ultrasound which showed distended urinary bladder with bilateral hydronephrosis status post Foley catheter placement who presents the emergency department with decreased urine output.  Patient was brought in by EMS from home for decreased urine output over the past 2 days.  She is only had an output of approximately 800 cc in the bag over the past 2 days.  The bag has not been emptied out during that time.  Urine appears dark.  Distally, the patient complains of right foot pain.  She was previously experiencing foot and toe pain which was thought to be gout during her last hospitalization and was started on colchicine and prednisone after serum uric acid was found to be elevated.  No recent trauma to the foot.  Past Medical History:  Diagnosis Date   Diabetes mellitus without complication ([place])    Interstitial cystitis    Thyroid disease     Patient Active Problem List   Diagnosis Date Noted   Fall    Compression of lumbar vertebra ([place])    Type 2 diabetes mellitus without complication, with long-term current use of insulin (H[facility])    Elevated transaminase level    Hypothyroidism    Syncope 11/23/2020   AKI (acute kidney injury) ([place]) 11/22/2020   Multiple falls 09/14/2020   Pressure injury of skin 09/14/2020   Generalized weakness 09/14/2020   Symptomatic anemia 07/26/2020   Acute kidney injury  superimposed on chronic kidney disease ([place]) 07/26/2020   Hyponatremia 07/26/2020   Hypokalemia 07/26/2020   Dislocated shoulder, right, initial encounter 07/26/2020   Closed displaced fracture of coracoid process of right shoulder 07/26/2020   Diabetes mellitus type 2 in obese ([place]) 07/26/2020   Elevated troponin 07/26/2020   Recurrent falls 08/16/2019   Sinus arrest 08/15/2019   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   Cervicalgia of occipito-atlanto-axial region 06/26/2019   Head injury 06/26/2019   Frequent falls 06/26/2019    Past Surgical History:  Procedure Laterality Date   ABDOMINAL HYSTERECTOMY     CHOLECYSTECTOMY     NECK SURGERY     PACEMAKER IMPLANT N/A 08/16/2019   Procedure: PACEMAKER IMPLANT;  Surgeon: Constance Haw, MD;  Location: [place] [facility];  Service: Cardiovascular;  Laterality: N/A;   REVERSE SHOULDER ARTHROPLASTY Right 07/28/2020   Procedure: REVERSE SHOULDER ARTHROPLASTY;  Surgeon: Justice Britain, MD;  Location: [facility];  Service: Orthopedics;  Laterality: Right;   SHOULDER SURGERY     THYROIDECTOMY       OB History   No obstetric history on file.     History reviewed. No pertinent family history.  Social History   Tobacco Use   Smoking status: Never   Smokeless tobacco: Never  Vaping Use   Vaping Use: Never used  Substance Use Topics   Alcohol use: No   Drug use: Never    Home Medications Prior to Admission medications   Medication Sig Start Date End Date Taking? Authorizing Provider  ASPIRIN LOW DOSE 81 MG EC tablet Take 81 mg by mouth daily. 06/16/19   [provider]  calcium-vitamin D 250-100 MG-UNIT tablet Take 1 tablet by mouth 2 (two) times daily. Patient not taking: No sig reported 09/14/20   Palumbo, April, MD  colchicine 0.6 MG tablet Take 1 tablet (0.6 mg total) by mouth daily. 11/27/20   Regalado, Belkys A, MD  diclofenac Sodium (VOLTAREN) 1 % GEL Apply 2 g topically 4 (four) times daily. 11/27/20   Regalado, Belkys  A, MD  feeding supplement (ENSURE ENLIVE / ENSURE PLUS) LIQD Take 237 mLs by mouth 2 (two) times daily between meals. 11/27/20   Regalado, Jon Billings A, MD  ferrous sulfate 325 (65 FE) MG tablet Take 1 tablet (325 mg total) by mouth daily with breakfast. 07/29/20   Amin, Loura Halt, MD  gabapentin (NEURONTIN) 300 MG capsule Take 1 capsule (300 mg total) by mouth 2 (two) times daily. 11/27/20   Regalado, Belkys A, MD  glipiZIDE (GLUCOTROL XL) 2.5 MG 24 hr tablet Take 1 tablet (2.5 mg total) by mouth daily with breakfast. 11/27/20   Regalado, Belkys A, MD  hydrOXYzine (ATARAX/VISTARIL) 25 MG tablet Take 25 mg by mouth at bedtime as needed for anxiety. 06/14/19   [provider]  levothyroxine (SYNTHROID) 150 MCG tablet Take 150 mcg by mouth daily. 09/01/20   [provider]  nystatin cream (MYCOSTATIN) Apply 1 application topically 2 (two) times daily as needed for dry skin. 11/16/20   [provider]  ondansetron (ZOFRAN-ODT) 4 MG disintegrating tablet Take 4 mg by mouth every 8 (eight) hours as needed for nausea or vomiting. 09/02/20   [provider]  polyethylene glycol (MIRALAX / GLYCOLAX) 17 g packet Take 17 g by mouth 2 (two) times daily. 11/27/20   Regalado, Belkys A, MD  pramipexole (MIRAPEX) 1.5 MG tablet Take 1.5 mg by mouth 3 (three) times daily. 06/29/20   [provider]    Allergies    Pentosan polysulfate sodium, Elmiron [pentosan polysulfate], and Naloxone  Review of Systems   Review of Systems  Constitutional:  Negative for chills and fever.  HENT:  Negative for ear pain and sore throat.   Eyes:  Negative for pain and visual disturbance.  Respiratory:  Negative for cough and shortness of breath.   Cardiovascular:  Negative for chest pain and palpitations.  Gastrointestinal:  Negative for abdominal pain and vomiting.  Genitourinary:  Positive for decreased urine volume. Negative for dysuria and hematuria.  Musculoskeletal:  Positive for back  pain. Negative for arthralgias.  Skin:  Negative for color change and rash.  Neurological:  Negative for seizures and syncope.  All other systems reviewed and are negative.  Physical Exam Updated Vital Signs BP 119/69   Pulse 71   Temp (!) 97.5 F (36.4 C) (Oral)   Resp 19   Ht 5' (1.524 m)   Wt 96 kg   SpO2 96%   BMI 41.33 kg/m   Physical Exam Vitals and nursing note reviewed.  Constitutional:      General: She is not in acute distress.    Appearance: She is well-developed.  HENT:     Head: Normocephalic and atraumatic.  Eyes:     Conjunctiva/sclera: Conjunctivae normal.     Pupils: Pupils are equal, round, and reactive to light.  Cardiovascular:     Rate and Rhythm: Normal rate and regular rhythm.     Heart sounds: No murmur heard. Pulmonary:     Effort: Pulmonary  effort is normal. No respiratory distress.     Breath sounds: Normal breath sounds.  Abdominal:     General: There is no distension.     Palpations: Abdomen is soft.     Tenderness: There is no abdominal tenderness. There is no guarding.  Genitourinary:    Comments: Foley catheter in place draining dark and cloudy urine Musculoskeletal:        General: No deformity or signs of injury.     Cervical back: Normal range of motion and neck supple.     Comments: Some right foot tenderness along the base of the plantar fascia with some tenderness of the metacarpals.  Skin:    General: Skin is warm and dry.     Findings: No lesion or rash.  Neurological:     General: No focal deficit present.     Mental Status: She is alert. Mental status is at baseline.     Sensory: No sensory deficit.     Motor: No weakness.    ED Results / Procedures / Treatments   Labs (all labs ordered are listed, but only abnormal results are displayed) Labs Reviewed  BASIC METABOLIC PANEL - Abnormal; Notable for the following components:      Result Value   Sodium 122 (*)    Chloride 82 (*)    CO2 20 (*)    BUN 52 (*)     Creatinine, Ser 3.71 (*)    Calcium 6.1 (*)    GFR, Estimated 12 (*)    Anion gap 20 (*)    All other components within normal limits  HEPATIC FUNCTION PANEL - Abnormal; Notable for the following components:   Albumin 3.1 (*)    Alkaline Phosphatase 182 (*)    All other components within normal limits  CBC WITH DIFFERENTIAL/PLATELET - Abnormal; Notable for the following components:   RBC 3.83 (*)    Hemoglobin 11.4 (*)    HCT 33.5 (*)    Platelets 460 (*)    All other components within normal limits  LACTIC ACID, PLASMA - Abnormal; Notable for the following components:   Lactic Acid, Venous 2.4 (*)    All other components within normal limits  CBG MONITORING, ED - Abnormal; Notable for the following components:   Glucose-Capillary 58 (*)    All other components within normal limits  CBG MONITORING, ED - Abnormal; Notable for the following components:   Glucose-Capillary 107 (*)    All other components within normal limits  RESP PANEL BY RT-PCR (FLU A&B, COVID) ARPGX2  LACTIC ACID, PLASMA  URINALYSIS, ROUTINE W REFLEX MICROSCOPIC  CALCIUM, IONIZED  PARATHYROID HORMONE, INTACT (NO CA)  VITAMIN D 25 HYDROXY (VIT D DEFICIENCY, FRACTURES)  MAGNESIUM  PHOSPHORUS    EKG EKG Interpretation  Date/Time:  Friday December 04 2020 18:27:57 EST Ventricular Rate:  76 PR Interval:  161 QRS Duration: 104 QT Interval:  454 QTC Calculation: 511 R Axis:   -40 Text Interpretation: Sinus rhythm Left axis deviation Abnormal R-wave progression, late transition Nonspecific T abnormalities, lateral leads Prolonged QT interval Confirmed by Regan Lemming ([id number]) on 12/04/2020 6:37:55 PM  Radiology No results found.  Procedures .Critical Care Performed by: Regan Lemming, MD Authorized by: Regan Lemming, MD   Critical care provider statement:    Critical care time (minutes):  34   Critical care was necessary to treat or prevent imminent or life-threatening deterioration of the following  conditions:  Endocrine crisis   Critical care was time spent personally  by me on the following activities:  Development of treatment plan with patient or surrogate, evaluation of patient's response to treatment, examination of patient, obtaining history from patient or surrogate, ordering and performing treatments and interventions, ordering and review of laboratory studies, ordering and review of radiographic studies, re-evaluation of patient's condition and review of old charts   Care discussed with: admitting provider     Medications Ordered in ED Medications  calcium gluconate 1 g/ 50 mL sodium chloride IVPB (has no administration in time range)  sodium chloride 0.9 % bolus 1,000 mL (0 mLs Intravenous Stopped 12/04/20 1906)  oxyCODONE-acetaminophen (PERCOCET/ROXICET) 5-325 MG per tablet 1 tablet (1 tablet Oral Given 12/04/20 1832)    ED Course  I have reviewed the triage vital signs and the nursing notes.  Pertinent labs & imaging results that were available during my care of the patient were reviewed by me and considered in my medical decision making (see chart for details).    MDM Rules/Calculators/A&P                            75 year old female with medical history significant for hypothyroidism, DM 2, sick sinus syndrome status post pacemaker, history of interstitial cystitis, frequent falls, recent acute L5 compression fracture that is managed conservatively and nonsurgically, recent AKI with a creatinine of 1.9, recent hospitalization during which time she had a renal ultrasound which showed distended urinary bladder with bilateral hydronephrosis status post Foley catheter placement who presents the emergency department with decreased urine output.  Patient was brought in by EMS from home for decreased urine output over the past 2 days.  She is only had an output of approximately 800 cc in the bag over the past 2 days.  The bag has not been emptied out during that time.  Urine  appears dark.  Distally, the patient complains of right foot pain.  She was previously experiencing foot and toe pain which was thought to be gout during her last hospitalization and was started on colchicine and prednisone after serum uric acid was found to be elevated.  No recent trauma to the foot.  On arrival, the patient was afebrile, hemodynamically stable.  Laboratory work-up initiated and was significant for mild hypoglycemia on arrival with a CBG of 58 which was subsequently treated with 250 cc of D10 and subsequent improvement in her blood sugar to 107. The patient has had some decreased oral intake but has been drinking water.  Additional laboratory work-up concerning for lactic acidosis 2.4, hyponatremia to 122, and anion gap metabolic acidosis with a bicarb of 20 and an anion gap of 20 hypocalcemia that is severe to 6.1 (corrects to 6.8 after correcting for hypoalbuminemia to 3.1), worsening AKI with a creatinine of 3.71 from a recent measurement of 0.88 one week ago.  X-ray imaging of the chest was negative for acute cardiac or pulm abnormality.  X-ray imaging of the right foot revealed bone spur along the plantar calcaneus with midfoot degenerative changes and spurring, no acute osseous abnormality, mild soft tissue edema.  Renal ultrasound pending.  Patient has QTC prolongation on EKG to 511. We will treat with IV calcium gluconate.  She is asymptomatic from her hypocalcemia and hyponatremia. She has a new AKI. Renal US ordered in addition to PTH, Vitamin D, ionized Ca, Mag, Phos. Hospitalist medicine consulted for admission.  Discussed the patient with Dr. Myna Hidalgo who accepts the patient admission.  Patient subsequently admitted in  stable condition.   Final Clinical Impression(s) / ED Diagnoses Final diagnoses:  Hypocalcemia  Hyponatremia  AKI (acute kidney injury) [facility])    Rx / [place] Orders ED Discharge Orders     None           Regan Lemming, MD 12/04/20 2041

## 2020-12-04 NOTE — ED Notes (Signed)
IV access placed, 250cc D10 admin via IV access. MD Lawsing aware

## 2020-12-05 ENCOUNTER — Other Ambulatory Visit: Payer: Self-pay

## 2020-12-05 DIAGNOSIS — N179 Acute kidney failure, unspecified: Principal | ICD-10-CM

## 2020-12-05 LAB — GLUCOSE, CAPILLARY
Glucose-Capillary: 71 mg/dL (ref 70–99)
Glucose-Capillary: 86 mg/dL (ref 70–99)
Glucose-Capillary: 45 mg/dL — ABNORMAL LOW (ref 70–99)
Glucose-Capillary: 58 mg/dL — ABNORMAL LOW (ref 70–99)
Glucose-Capillary: 100 mg/dL — ABNORMAL HIGH (ref 70–99)
Glucose-Capillary: 74 mg/dL (ref 70–99)

## 2020-12-05 LAB — BASIC METABOLIC PANEL
Anion gap: 17 — ABNORMAL HIGH (ref 5–15)
Anion gap: 19 — ABNORMAL HIGH (ref 5–15)
BUN: 52 mg/dL — ABNORMAL HIGH (ref 8–23)
BUN: 49 mg/dL — ABNORMAL HIGH (ref 8–23)
CO2: 19 mmol/L — ABNORMAL LOW (ref 22–32)
CO2: 22 mmol/L (ref 22–32)
Calcium: 5.8 mg/dL — CL (ref 8.9–10.3)
Calcium: 6.3 mg/dL — CL (ref 8.9–10.3)
Chloride: 85 mmol/L — ABNORMAL LOW (ref 98–111)
Chloride: 85 mmol/L — ABNORMAL LOW (ref 98–111)
Creatinine, Ser: 3.52 mg/dL — ABNORMAL HIGH (ref 0.44–1.00)
Creatinine, Ser: 3.64 mg/dL — ABNORMAL HIGH (ref 0.44–1.00)
GFR, Estimated: 13 mL/min — ABNORMAL LOW (ref >60)
GFR, Estimated: 13 mL/min — ABNORMAL LOW (ref >60)
Glucose, Bld: 50 mg/dL — ABNORMAL LOW (ref 70–99)
Glucose, Bld: 58 mg/dL — ABNORMAL LOW (ref 70–99)
Potassium: 3.4 mmol/L — ABNORMAL LOW (ref 3.5–5.1)
Potassium: 3.5 mmol/L (ref 3.5–5.1)
Sodium: 121 mmol/L — ABNORMAL LOW (ref 135–145)
Sodium: 126 mmol/L — ABNORMAL LOW (ref 135–145)

## 2020-12-05 LAB — CBC
HCT: 32.2 % — ABNORMAL LOW (ref 36.0–46.0)
Hemoglobin: 11.2 g/dL — ABNORMAL LOW (ref 12.0–15.0)
MCH: 30.1 pg (ref 26.0–34.0)
MCHC: 34.8 g/dL (ref 30.0–36.0)
MCV: 86.6 fL (ref 80.0–100.0)
Platelets: 478 10*3/uL — ABNORMAL HIGH (ref 150–400)
RBC: 3.72 MIL/uL — ABNORMAL LOW (ref 3.87–5.11)
RDW: 14.6 % (ref 11.5–15.5)
WBC: 11.8 10*3/uL — ABNORMAL HIGH (ref 4.0–10.5)
nRBC: 0 % (ref 0.0–0.2)

## 2020-12-05 LAB — SODIUM, URINE, RANDOM: Sodium, Ur: 72 mmol/L

## 2020-12-05 LAB — CREATININE, URINE, RANDOM: Creatinine, Urine: 18.13 mg/dL

## 2020-12-05 MED ORDER — CALCIUM GLUCONATE-NACL 1-0.675 GM/50ML-% IV SOLN
1.0000 g | Freq: Once | INTRAVENOUS | Status: AC
  Administered 2020-12-05: 1000 mg via INTRAVENOUS
  Filled 2020-12-05: qty 50

## 2020-12-05 MED ORDER — CALCIUM GLUCONATE-NACL 1-0.675 GM/50ML-% IV SOLN
1.0000 g | Freq: Once | INTRAVENOUS | Status: AC
  Administered 2020-12-05: 1000 mg via INTRAVENOUS
  Filled 2020-12-05 (×2): qty 50

## 2020-12-05 MED ORDER — DEXTROSE 50 % IV SOLN
INTRAVENOUS | Status: AC
  Administered 2020-12-05: 50 mL
  Filled 2020-12-05: qty 50

## 2020-12-05 MED ORDER — WHITE PETROLATUM EX OINT
TOPICAL_OINTMENT | CUTANEOUS | Status: AC
  Administered 2020-12-05: 0.2
  Filled 2020-12-05: qty 28.35

## 2020-12-05 MED ORDER — CHLORHEXIDINE GLUCONATE CLOTH 2 % EX PADS
6.0000 | MEDICATED_PAD | Freq: Every day | CUTANEOUS | Status: DC
  Administered 2020-12-05 – 2020-12-09 (×5): 6 via TOPICAL

## 2020-12-05 MED ORDER — SODIUM CHLORIDE 0.9 % IV SOLN: INTRAVENOUS | Status: DC

## 2020-12-05 NOTE — Progress Notes (Signed)
Hypoglycemic Event  CBG: 45  Treatment: 8 oz juice/soda and food  Symptoms: None  Follow-up CBG: Time:1719 CBG Result:58  Possible Reasons for Event: Inadequate meal intake  Comments: Patient had juice and encouraged to eat something. Rechecked and was 58. Encouraged to eat again, and given d50. Recheck 100 at 1745.    Myrtis Hopping

## 2020-12-05 NOTE — Progress Notes (Signed)
PROGRESS NOTE   Jillian Leblanc  [id number] DOB: [birth year] DOA: 12/04/2020 PCP: Karle Plumber, MD  Brief Narrative:  75 year old white female known history 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 sick sinus syndrome multiple falls She has been admitted in August and most recently 10/30 through 11/27/2020 for falls-at last admission he had 5 compression fracture which was managed nonoperatively she also had mild AKI and some encephalopathy at times--she was discharged with urinary catheter secondary to bilateral hydronephrosis and retention Patient returned to the hospital 11/11 X 2 days with decreased urine output CBGs in the 50s, and found to have AKI creatinine 3.7 hypocalcemia in addition and metabolic acidosis   Hospital-Problem based course  Hyponatremia, hypovolemic AKI prior to admission likely prerenal azotemia Etiology unclear--will need to ensure adequate irrigation prior to d/c and good follow-up on d/c Dr. Karilyn Cota urology Was not on any offending agents known to cause AKI prior to admit Continue IVF NS 100cc/h--slow improving Hypocalcemia Replace again with calcium gluconate 1g Check am mag and replace as well if low Await PTH level Interstitial cystitis Has seen Dr. Logan Bores chronically Dm ty ii complicated by severe hypoglycemia on admit Hold glipizide XL 2.5given risk of extnesion of effect 2/2 AKI--cresume gabapentin when creat improves Recent vertebral fracture in  a setting of multiple falls Monitor mobility in next 24-48 h Sacral decubitus present on admit Dressings and turning as per protocol  DVT prophylaxis: heparin Code Status: full Family Communication: none + at  bedside Disposition:  Status is: Inpatient  Remains inpatient appropriate because: of illness       Consultants:  n  Procedures: n  Antimicrobials: n    Subjective: Awake coherent feesl some better no distress no fever no chills  Objective: Vitals:   12/04/20 2249 12/05/20  0455 12/05/20 0633 12/05/20 0920  BP: 111/68 132/88 (!) 103/54 110/60  Pulse: 83 84 80 78  Resp: 19 19  18   Temp: 99 F (37.2 C) 97.8 F (36.6 C) (!) 97.4 F (36.3 C) 98 F (36.7 C)  TempSrc:   Oral Oral  SpO2: 95% 96% 98% 98%  Weight: 86.2 kg     Height:        Intake/Output Summary (Last 24 hours) at 12/05/2020 1342 Last data filed at 12/05/2020 0900 Gross per 24 hour  Intake 120 ml  Output 1450 ml  Net -1330 ml   Filed Weights   12/04/20 1742 12/04/20 2249  Weight: 96 kg 86.2 kg    Examination:  Obese leasant CF wearing oxygen No distress S1 s 2no m/r/g Ctab no added sound Abd soft nt nd no rebound no gaurd Trace to +1 edema LE   Data Reviewed: personally reviewed   CBC    Component Value Date/Time   WBC 11.8 (H) 12/05/2020 0101   RBC 3.72 (L) 12/05/2020 0101   HGB 11.2 (L) 12/05/2020 0101   HCT 32.2 (L) 12/05/2020 0101   PLT 478 (H) 12/05/2020 0101   MCV 86.6 12/05/2020 0101   MCH 30.1 12/05/2020 0101   MCHC 34.8 12/05/2020 0101   RDW 14.6 12/05/2020 0101   LYMPHSABS 0.8 12/04/2020 1734   MONOABS 0.5 12/04/2020 1734   EOSABS 0.0 12/04/2020 1734   BASOSABS 0.0 12/04/2020 1734   CMP Latest Ref Rng & Units 12/05/2020 12/04/2020 11/27/2020  Glucose 70 - 99 mg/dL 34(L) 71 937(T)  BUN 8 - 23 mg/dL 02(I) 09(B) 15  Creatinine 0.44 - 1.00 mg/dL 3.53(G) 9.92(E) 2.68  Sodium 135 - 145  mmol/L 121(L) 122(L) 134(L)  Potassium 3.5 - 5.1 mmol/L 3.4(L) 3.6 4.4  Chloride 98 - 111 mmol/L 85(L) 82(L) 101  CO2 22 - 32 mmol/L 19(L) 20(L) 27  Calcium 8.9 - 10.3 mg/dL 1.6(XW5.8(LL) 6.1(LL) 7.8(L)  Total Protein 6.5 - 8.1 g/dL - 7.1 -  Total Bilirubin 0.3 - 1.2 mg/dL - 0.9 -  Alkaline Phos 38 - 126 U/L - 182(H) -  AST 15 - 41 U/L - 36 -  ALT 0 - 44 U/L - 28 -     Radiology Studies: US Renal  Result Date: 12/04/2020 CLINICAL DATA:  Decreased urine output EXAM: RENAL / URINARY TRACT ULTRASOUND COMPLETE COMPARISON:  None. FINDINGS: Right Kidney: Renal measurements: 9.2 x  5.1 x 4.4 cm = volume: 108 mL. There is moderate renal cortical atrophy and cortical scarring, similar to that noted on prior examination. Diffusely increased renal cortical echogenicity appears stable, in keeping with underlying medical renal disease. Previously noted hydronephrosis has resolved with mild residual fullness of the right renal pelvis noted. No intrarenal masses or calcifications are seen. No perinephric fluid collections are seen. Left Kidney: Renal measurements: 11.1 x 6.0 x 6.4 cm = volume: 221 mL. Mild left renal cortical thinning and borderline cortical hyperechogenicity is unchanged. No hydronephrosis. No intrarenal masses or calcifications are seen. Bladder: The bladder is decompressed Other: None. IMPRESSION: Interval bladder decompression with resolution of bilateral hydronephrosis. Stable cortical hyperechogenicity, asymmetrically more severe on the right, suggesting changes of underlying medical renal disease. Stable bilateral renal cortical atrophy, asymmetrically more severe on the right, with superimposed areas of cortical scarring on the right. Electronically Signed   By: Helyn NumbersAshesh  Parikh M.D.   On: 12/04/2020 19:52   DG Chest Portable 1 View  Result Date: 12/04/2020 CLINICAL DATA:  Cough. EXAM: PORTABLE CHEST 1 VIEW COMPARISON:  Radiograph 11/22/2020.  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 Stable heart size and mediastinal contours. No acute airspace disease. No pulmonary edema, pleural effusion, or pneumothorax. Reverse right shoulder arthroplasty. IMPRESSION: No acute abnormality. Electronically Signed   By: Narda RutherfordMelanie  Sanford M.D.   On: 12/04/2020 20:07   DG Foot Complete Right  Result Date: 12/04/2020 CLINICAL DATA:  Right foot pain. EXAM: RIGHT FOOT COMPLETE - 3+ VIEW COMPARISON:  None. FINDINGS: There is no evidence of fracture or dislocation. Hammertoe deformity of the digits. Midfoot degenerative change with spurring. There is a plantar calcaneal spur. No  erosion or bony destruction. Mild soft tissue edema overlies the dorsum of the foot. IMPRESSION: 1. Midfoot degenerative change with spurring. Plantar calcaneal spur. 2. No acute osseous abnormality. 3. Mild soft tissue edema. Electronically Signed   By: Narda RutherfordMelanie  Sanford M.D.   On: 12/04/2020 20:06   DG Hip Unilat W or Wo Pelvis 2-3 Views Right  Result Date: 12/04/2020 CLINICAL DATA:  Hip pain. EXAM: DG HIP (WITH OR WITHOUT PELVIS) 2-3V RIGHT COMPARISON:  Pelvis radiograph 11/22/2020 FINDINGS: The bones are diffusely under mineralized. Mild bilateral hip degenerative change. No evidence of fracture, erosion, avascular necrosis or focal bone abnormality. Pre sacral stimulator in place. IMPRESSION: Mild bilateral hip degenerative change.  No acute findings. Electronically Signed   By: Narda RutherfordMelanie  Sanford M.D.   On: 12/04/2020 20:09     Scheduled Meds:  Chlorhexidine Gluconate Cloth  6 each Topical Daily   gabapentin  100 mg Oral QHS   heparin  5,000 Units Subcutaneous Q8H   insulin aspart  0-6 Units Subcutaneous TID WC   levothyroxine  150 mcg Oral Daily  sodium chloride flush  3 mL Intravenous Q12H   Continuous Infusions:   LOS: 1 day   Time spent: 88  Rhetta Mura, MD [facility] To contact the attending provider between 7A-7P or the covering provider during after hours 7P-7A, please log into the web site [email or web address] and access using universal  password for that web site. If you do not have the password, please call the hospital operator.  12/05/2020, 1:42 PM

## 2020-12-05 NOTE — Progress Notes (Signed)
Lab reports at 0139  critical calcium of 5.8 drawn at o100. Dr Imogene Burn notified. Orders given

## 2020-12-05 NOTE — Progress Notes (Signed)
I was notified by nursing staff for a second assessment of Jillian Leblanc's mental status. Reportedly pt awoke confused and some delayed responses. CBG 71 and pt was given juice prior to my arrival. Pt was drowsy however able to follow commands and answer some questions. No focal deficits. Pt c/o hip pain and burning in her feet. Highly likely her mentation was altered from a low blood sugar.

## 2020-12-05 NOTE — Progress Notes (Signed)
Critical Lab result Calcium 6.3.  MD notified. Orders placed.  Jean Rosenthal, RN

## 2020-12-05 NOTE — Progress Notes (Signed)
  X-cover Note: Secure chat message sent by bedside RN. Pt with multiple watery loose stools. RN requesting flexiseal fecal management system.  Defer to rounding attending if C. Diff testing would be appropriate.   Carollee Herter, DO [facility]

## 2020-12-06 LAB — BASIC METABOLIC PANEL
Anion gap: 13 (ref 5–15)
BUN: 34 mg/dL — ABNORMAL HIGH (ref 8–23)
CO2: 19 mmol/L — ABNORMAL LOW (ref 22–32)
Calcium: 6.8 mg/dL — ABNORMAL LOW (ref 8.9–10.3)
Chloride: 93 mmol/L — ABNORMAL LOW (ref 98–111)
Creatinine, Ser: 2.06 mg/dL — ABNORMAL HIGH (ref 0.44–1.00)
GFR, Estimated: 25 mL/min — ABNORMAL LOW (ref >60)
Glucose, Bld: 139 mg/dL — ABNORMAL HIGH (ref 70–99)
Potassium: 3.6 mmol/L (ref 3.5–5.1)
Sodium: 125 mmol/L — ABNORMAL LOW (ref 135–145)

## 2020-12-06 LAB — C DIFFICILE QUICK SCREEN W PCR REFLEX
C Diff antigen: NEGATIVE
C Diff interpretation: NOT DETECTED
C Diff toxin: NEGATIVE

## 2020-12-06 LAB — CBC
HCT: 32.8 % — ABNORMAL LOW (ref 36.0–46.0)
Hemoglobin: 11 g/dL — ABNORMAL LOW (ref 12.0–15.0)
MCH: 29.7 pg (ref 26.0–34.0)
MCHC: 33.5 g/dL (ref 30.0–36.0)
MCV: 88.6 fL (ref 80.0–100.0)
Platelets: 397 10*3/uL (ref 150–400)
RBC: 3.7 MIL/uL — ABNORMAL LOW (ref 3.87–5.11)
RDW: 14.7 % (ref 11.5–15.5)
WBC: 5.8 10*3/uL (ref 4.0–10.5)
nRBC: 0 % (ref 0.0–0.2)

## 2020-12-06 LAB — GLUCOSE, CAPILLARY
Glucose-Capillary: 86 mg/dL (ref 70–99)
Glucose-Capillary: 144 mg/dL — ABNORMAL HIGH (ref 70–99)
Glucose-Capillary: 122 mg/dL — ABNORMAL HIGH (ref 70–99)
Glucose-Capillary: 86 mg/dL (ref 70–99)

## 2020-12-06 LAB — MAGNESIUM: Magnesium: 2 mg/dL (ref 1.7–2.4)

## 2020-12-06 MED ORDER — METHOCARBAMOL 1000 MG/10ML IJ SOLN
500.0000 mg | Freq: Once | INTRAVENOUS | Status: AC
  Administered 2020-12-06: 500 mg via INTRAVENOUS
  Filled 2020-12-06: qty 5

## 2020-12-06 MED ORDER — CALCIUM GLUCONATE-NACL 2-0.675 GM/100ML-% IV SOLN
2.0000 g | Freq: Once | INTRAVENOUS | Status: AC
  Administered 2020-12-06: 2000 mg via INTRAVENOUS
  Filled 2020-12-06: qty 100

## 2020-12-06 NOTE — Progress Notes (Signed)
PROGRESS NOTE   Jillian Leblanc  YEB:[id number] DOB: 29-May-1945 DOA: 12/04/2020 PCP: Karle Plumber, MD  Brief Narrative:  75 year old white female known history 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 sick sinus syndrome multiple falls She has been admitted in August and most recently 10/30 through 11/27/2020 for falls-at last admission he had 5 compression fracture which was managed nonoperatively she also had mild AKI and some encephalopathy at times--she was discharged with urinary catheter secondary to bilateral hydronephrosis and retention Patient returned to the hospital 11/11 X 2 days with decreased urine output CBGs in the 50s, and found to have AKI creatinine 3.7 hypocalcemia in addition and metabolic acidosis   Hospital-Problem based course  Hyponatremia, hypovolemic AKI prior to admission likely prerenal azotemia Etiology unclear--will need to ensure adequate irrigation prior to d/c and good follow-up on d/c Dr. Karilyn Cota urology Continue IVF NS 100cc/h--slow improving Hypocalcemia Replace again with calcium gluconate 1g Check am mag and replace as well if low Await PTH level Interstitial cystitis Has seen Dr. Logan Bores chronically Dm ty ii complicated by severe hypoglycemia on admit Hold glipizide XL 2.5g-continue lower dosing gabapentin 100 qd Recent vertebral fracture in  a setting of multiple falls Monitor mobility in next 24-48 h Get PT to eval ensure safe for home mobility Sacral decubitus present on admit Dressings and turning as per protocol  DVT prophylaxis: heparin Code Status: full Family Communication: none + at  bedside Disposition:  Status is: Inpatient  Remains inpatient appropriate because: of illness  Consultants:  n  Procedures: n  Antimicrobials: n    Subjective:  Dopesnt feel good Overall stable since yesterday No n/v No cp No fever  Objective: Vitals:   12/05/20 2202 12/06/20 0342 12/06/20 0500 12/06/20 0934  BP: 122/71 (!) 110/57   (!) 112/55  Pulse: 76 71  68  Resp: 16 16  16   Temp: 98.4 F (36.9 C) 98 F (36.7 C)  98 F (36.7 C)  TempSrc: Oral Oral  Oral  SpO2: 97% 100%  100%  Weight:   87.3 kg   Height:        Intake/Output Summary (Last 24 hours) at 12/06/2020 1603 Last data filed at 12/06/2020 1500 Gross per 24 hour  Intake 3488.81 ml  Output 3275 ml  Net 213.81 ml    Filed Weights   12/04/20 1742 12/04/20 2249 12/06/20 0500  Weight: 96 kg 86.2 kg 87.3 kg    Examination:  Obese wf nad S1 s 2no m/r/g Ctab no added sound Abd soft nt nd no rebound no gaurd Trace to +1 edema LE   Data Reviewed: personally reviewed   CBC    Component Value Date/Time   WBC 5.8 12/06/2020 0540   RBC 3.70 (L) 12/06/2020 0540   HGB 11.0 (L) 12/06/2020 0540   HCT 32.8 (L) 12/06/2020 0540   PLT 397 12/06/2020 0540   MCV 88.6 12/06/2020 0540   MCH 29.7 12/06/2020 0540   MCHC 33.5 12/06/2020 0540   RDW 14.7 12/06/2020 0540   LYMPHSABS 0.8 12/04/2020 1734   MONOABS 0.5 12/04/2020 1734   EOSABS 0.0 12/04/2020 1734   BASOSABS 0.0 12/04/2020 1734   CMP Latest Ref Rng & Units 12/06/2020 12/06/2020 12/05/2020  Glucose 70 - 99 mg/dL 837(G) 902(X) 11(B)  BUN 8 - 23 mg/dL 52(C) 80(E) 23(V)  Creatinine 0.44 - 1.00 mg/dL 6.12(A) 4.49(P) 5.30(Y)  Sodium 135 - 145 mmol/L 125(L) 126(L) 126(L)  Potassium 3.5 - 5.1 mmol/L 3.6 3.1(L) 3.5  Chloride 98 -  111 mmol/L 93(L) 88(L) 85(L)  CO2 22 - 32 mmol/L 19(L) 24 22  Calcium 8.9 - 10.3 mg/dL 6.8(L) 6.3(LL) 6.3(LL)  Total Protein 6.5 - 8.1 g/dL - - -  Total Bilirubin 0.3 - 1.2 mg/dL - - -  Alkaline Phos 38 - 126 U/L - - -  AST 15 - 41 U/L - - -  ALT 0 - 44 U/L - - -     Radiology Studies: US Renal  Result Date: 12/04/2020 CLINICAL DATA:  Decreased urine output EXAM: RENAL / URINARY TRACT ULTRASOUND COMPLETE COMPARISON:  None. FINDINGS: Right Kidney: Renal measurements: 9.2 x 5.1 x 4.4 cm = volume: 108 mL. There is moderate renal cortical atrophy and cortical  scarring, similar to that noted on prior examination. Diffusely increased renal cortical echogenicity appears stable, in keeping with underlying medical renal disease. Previously noted hydronephrosis has resolved with mild residual fullness of the right renal pelvis noted. No intrarenal masses or calcifications are seen. No perinephric fluid collections are seen. Left Kidney: Renal measurements: 11.1 x 6.0 x 6.4 cm = volume: 221 mL. Mild left renal cortical thinning and borderline cortical hyperechogenicity is unchanged. No hydronephrosis. No intrarenal masses or calcifications are seen. Bladder: The bladder is decompressed Other: None. IMPRESSION: Interval bladder decompression with resolution of bilateral hydronephrosis. Stable cortical hyperechogenicity, asymmetrically more severe on the right, suggesting changes of underlying medical renal disease. Stable bilateral renal cortical atrophy, asymmetrically more severe on the right, with superimposed areas of cortical scarring on the right. Electronically Signed   By: Fidela Salisbury M.D.   On: 12/04/2020 19:52   DG Chest Portable 1 View  Result Date: 12/04/2020 CLINICAL DATA:  Cough. EXAM: PORTABLE CHEST 1 VIEW COMPARISON:  Radiograph 11/22/2020.  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 Stable heart size and mediastinal contours. No acute airspace disease. No pulmonary edema, pleural effusion, or pneumothorax. Reverse right shoulder arthroplasty. IMPRESSION: No acute abnormality. Electronically Signed   By: Keith Rake M.D.   On: 12/04/2020 20:07   DG Foot Complete Right  Result Date: 12/04/2020 CLINICAL DATA:  Right foot pain. EXAM: RIGHT FOOT COMPLETE - 3+ VIEW COMPARISON:  None. FINDINGS: There is no evidence of fracture or dislocation. Hammertoe deformity of the digits. Midfoot degenerative change with spurring. There is a plantar calcaneal spur. No erosion or bony destruction. Mild soft tissue edema overlies the dorsum of the foot.  IMPRESSION: 1. Midfoot degenerative change with spurring. Plantar calcaneal spur. 2. No acute osseous abnormality. 3. Mild soft tissue edema. Electronically Signed   By: Keith Rake M.D.   On: 12/04/2020 20:06   DG Hip Unilat W or Wo Pelvis 2-3 Views Right  Result Date: 12/04/2020 CLINICAL DATA:  Hip pain. EXAM: DG HIP (WITH OR WITHOUT PELVIS) 2-3V RIGHT COMPARISON:  Pelvis radiograph 11/22/2020 FINDINGS: The bones are diffusely under mineralized. Mild bilateral hip degenerative change. No evidence of fracture, erosion, avascular necrosis or focal bone abnormality. Pre sacral stimulator in place. IMPRESSION: Mild bilateral hip degenerative change.  No acute findings. Electronically Signed   By: Keith Rake M.D.   On: 12/04/2020 20:09     Scheduled Meds:  Chlorhexidine Gluconate Cloth  6 each Topical Daily   gabapentin  100 mg Oral QHS   heparin  5,000 Units Subcutaneous Q8H   insulin aspart  0-6 Units Subcutaneous TID WC   levothyroxine  150 mcg Oral Daily   sodium chloride flush  3 mL Intravenous Q12H   Continuous Infusions:  sodium chloride 100 mL/hr  at 12/06/20 0337     LOS: 2 days   Time spent: 55  Rhetta Mura, MD [facility] To contact the attending provider between 7A-7P or the covering provider during after hours 7P-7A, please log into the web site [email or web address] and access using universal [place] password for that web site. If you do not have the password, please call the hospital operator.  12/06/2020, 4:03 PM

## 2020-12-06 NOTE — Progress Notes (Signed)
Date and time results received:  12/06/20 0854   Test: Calcium Critical Value: 6.3  Name of Provider Notified:  Landis Gandy, HD

## 2020-12-07 LAB — RENAL FUNCTION PANEL
Albumin: 2.9 g/dL — ABNORMAL LOW (ref 3.5–5.0)
Anion gap: 12 (ref 5–15)
BUN: 20 mg/dL (ref 8–23)
CO2: 23 mmol/L (ref 22–32)
Calcium: 6.9 mg/dL — ABNORMAL LOW (ref 8.9–10.3)
Chloride: 94 mmol/L — ABNORMAL LOW (ref 98–111)
Creatinine, Ser: 1.43 mg/dL — ABNORMAL HIGH (ref 0.44–1.00)
GFR, Estimated: 39 mL/min — ABNORMAL LOW (ref >60)
Glucose, Bld: 146 mg/dL — ABNORMAL HIGH (ref 70–99)
Phosphorus: 3 mg/dL (ref 2.5–4.6)
Potassium: 3.5 mmol/L (ref 3.5–5.1)
Sodium: 129 mmol/L — ABNORMAL LOW (ref 135–145)

## 2020-12-07 LAB — BASIC METABOLIC PANEL
Anion gap: 14 (ref 5–15)
BUN: 40 mg/dL — ABNORMAL HIGH (ref 8–23)
CO2: 24 mmol/L (ref 22–32)
Calcium: 6.3 mg/dL — CL (ref 8.9–10.3)
Chloride: 88 mmol/L — ABNORMAL LOW (ref 98–111)
Creatinine, Ser: 2.53 mg/dL — ABNORMAL HIGH (ref 0.44–1.00)
GFR, Estimated: 20 mL/min — ABNORMAL LOW (ref >60)
Glucose, Bld: 112 mg/dL — ABNORMAL HIGH (ref 70–99)
Potassium: 3.1 mmol/L — ABNORMAL LOW (ref 3.5–5.1)
Sodium: 126 mmol/L — ABNORMAL LOW (ref 135–145)

## 2020-12-07 LAB — CALCIUM, IONIZED: Calcium, Ionized, Serum: 3.1 mg/dL — ABNORMAL LOW (ref 4.5–5.6)

## 2020-12-07 LAB — GASTROINTESTINAL PANEL BY PCR, STOOL (REPLACES STOOL CULTURE)

## 2020-12-07 LAB — CBC
HCT: 35.7 % — ABNORMAL LOW (ref 36.0–46.0)
Hemoglobin: 11.5 g/dL — ABNORMAL LOW (ref 12.0–15.0)
MCH: 29 pg (ref 26.0–34.0)
MCHC: 32.2 g/dL (ref 30.0–36.0)
MCV: 90.2 fL (ref 80.0–100.0)
Platelets: 331 10*3/uL (ref 150–400)
RBC: 3.96 MIL/uL (ref 3.87–5.11)
RDW: 15 % (ref 11.5–15.5)
WBC: 5.6 10*3/uL (ref 4.0–10.5)
nRBC: 0 % (ref 0.0–0.2)

## 2020-12-07 LAB — OCCULT BLOOD X 1 CARD TO LAB, STOOL: Fecal Occult Bld: POSITIVE — AB

## 2020-12-07 LAB — GLUCOSE, CAPILLARY
Glucose-Capillary: 110 mg/dL — ABNORMAL HIGH (ref 70–99)
Glucose-Capillary: 174 mg/dL — ABNORMAL HIGH (ref 70–99)
Glucose-Capillary: 139 mg/dL — ABNORMAL HIGH (ref 70–99)
Glucose-Capillary: 149 mg/dL — ABNORMAL HIGH (ref 70–99)

## 2020-12-07 LAB — PARATHYROID HORMONE, INTACT (NO CA): PTH: 31 pg/mL (ref 15–65)

## 2020-12-07 MED ORDER — CALCIUM CARBONATE ANTACID 500 MG PO CHEW
800.0000 mg | CHEWABLE_TABLET | Freq: Two times a day (BID) | ORAL | Status: DC
  Administered 2020-12-07 – 2020-12-09 (×5): 800 mg via ORAL
  Filled 2020-12-07 (×4): qty 4

## 2020-12-07 NOTE — Plan of Care (Signed)
  Problem: Education: Goal: Knowledge of General Education information will improve Description: Including pain rating scale, medication(s)/side effects and non-pharmacologic comfort measures Outcome: Progressing   Problem: Clinical Measurements: Goal: Will remain free from infection Outcome: Progressing Goal: Diagnostic test results will improve Outcome: Progressing   Problem: Activity: Goal: Risk for activity intolerance will decrease Outcome: Progressing   Problem: Nutrition: Goal: Adequate nutrition will be maintained Outcome: Progressing   Problem: Elimination: Goal: Will not experience complications related to bowel motility Outcome: Progressing Goal: Will not experience complications related to urinary retention Outcome: Progressing   Problem: Safety: Goal: Ability to remain free from injury will improve Outcome: Progressing   

## 2020-12-07 NOTE — Progress Notes (Signed)
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 sick sinus syndrome multiple falls She has been admitted in August and most recently 10/30 through 11/27/2020 for falls-at last admission he had 5 compression fracture which was managed nonoperatively she also had mild AKI and some encephalopathy at times--she was discharged with urinary catheter secondary to bilateral hydronephrosis and retention Patient returned to the hospital 11/11 X 2 days with decreased urine output CBGs in the 50s, and found to have AKI creatinine 3.7 hypocalcemia in addition and metabolic acidosis   Hospital-Problem based course  Hyponatremia, hypovolemic AKI prior to admission likely prerenal azotemia ensure adequate irrigation prior to d/c and good follow-up on d/c Dr. Karilyn Cota urology Continue IVF NS 100cc/h--slow improving sodium, creat Subacute diarrhea  Has been going on for several weeks.  Stool bacame loose over 24 hours to the point of needing flexiseal Hemoccult is +, but hemoglobin stable and this can be worked up as OP Await GI path panel If no pathogen, start scheduled imodium Hypocalcemia Replace again with calcium gluconate 1g Check am mag and replace as well if low PTH level normal--no further workup Replace orally Interstitial cystitis Has seen Dr. Logan Bores chronically Dm ty ii complicated by severe hypoglycemia on admit Hold glipizide XL 2.5g-continue lower dosing gabapentin 100 qd Recent vertebral fracture in  a setting of multiple falls Monitor mobility in next 24-48 h PT rec's SNF Sacral decubitus present on admit Dressings and turning as per protocol  DVT prophylaxis: heparin Code Status: full Family Communication: none + at  bedside Disposition:  Status is: Inpatient  Remains inpatient appropriate because: of  illness  Consultants:  n  Procedures: n  Antimicrobials: n    Subjective:  Increasing diarr--flexiseal placed Tells me has had this for several weeks No cp no n/v tol diet  Objective: Vitals:   12/06/20 2031 12/07/20 0400 12/07/20 0442 12/07/20 0744  BP: (!) 131/57  (!) 124/53 130/60  Pulse: 74  72 73  Resp: 17  17 18   Temp: 99.5 F (37.5 C)  98.3 F (36.8 C) 98.5 F (36.9 C)  TempSrc: Oral  Oral Oral  SpO2: 97%  94% 100%  Weight:  89.3 kg    Height:        Intake/Output Summary (Last 24 hours) at 12/07/2020 1245 Last data filed at 12/07/2020 0844 Gross per 24 hour  Intake 2644.09 ml  Output 2800 ml  Net -155.91 ml    Filed Weights   12/04/20 2249 12/06/20 0500 12/07/20 0400  Weight: 86.2 kg 87.3 kg 89.3 kg    Examination:  Obese wf nad, on oxygen S1 s 2no m/r/g-nsr on monitors Ctab no added sound Abd soft nt nd no rebound no gaurd Trace to +1 edema LE   Data Reviewed: personally reviewed   CBC    Component Value Date/Time   WBC 5.6 12/07/2020 0646   RBC 3.96 12/07/2020 0646   HGB 11.5 (L) 12/07/2020 0646   HCT 35.7 (L) 12/07/2020 0646   PLT 331 12/07/2020 0646   MCV 90.2 12/07/2020 0646   MCH 29.0 12/07/2020 0646   MCHC 32.2 12/07/2020 0646   RDW 15.0 12/07/2020 0646   LYMPHSABS 0.8 12/04/2020 1734   MONOABS 0.5 12/04/2020 1734   EOSABS 0.0 12/04/2020 1734   BASOSABS 0.0 12/04/2020 1734   CMP Latest Ref Rng & Units 12/07/2020  12/06/2020 12/06/2020  Glucose 70 - 99 mg/dL 146(H) 139(H) 112(H)  BUN 8 - 23 mg/dL 20 34(H) 40(H)  Creatinine 0.44 - 1.00 mg/dL 1.43(H) 2.06(H) 2.53(H)  Sodium 135 - 145 mmol/L 129(L) 125(L) 126(L)  Potassium 3.5 - 5.1 mmol/L 3.5 3.6 3.1(L)  Chloride 98 - 111 mmol/L 94(L) 93(L) 88(L)  CO2 22 - 32 mmol/L 23 19(L) 24  Calcium 8.9 - 10.3 mg/dL 6.9(L) 6.8(L) 6.3(LL)  Total Protein 6.5 - 8.1 g/dL - - -  Total Bilirubin 0.3 - 1.2 mg/dL - - -  Alkaline Phos 38 - 126 U/L - - -  AST 15 - 41 U/L - - -  ALT 0 - 44 U/L -  - -     Radiology Studies: No results found.   Scheduled Meds:  calcium carbonate  800 mg of elemental calcium Oral BID   Chlorhexidine Gluconate Cloth  6 each Topical Daily   gabapentin  100 mg Oral QHS   heparin  5,000 Units Subcutaneous Q8H   insulin aspart  0-6 Units Subcutaneous TID WC   levothyroxine  150 mcg Oral Daily   sodium chloride flush  3 mL Intravenous Q12H   Continuous Infusions:  sodium chloride 100 mL/hr at 12/07/20 0844     LOS: 3 days   Time spent: [place], MD [facility] To contact the attending provider between 7A-7P or the covering provider during after hours 7P-7A, please log into the web site [email or web address] and access using universal [place] password for that web site. If you do not have the password, please call the hospital operator.  12/07/2020, 12:45 PM

## 2020-12-07 NOTE — Evaluation (Signed)
Physical Therapy Evaluation Patient Details Name: Jillian Leblanc MRN: [id number] DOB: Jan 03, 1946 Today's Date: 12/07/2020  History of Present Illness  Pt is a 75 y.o. F who presents 12/04/2020 with hyponatremia, hypocalcemia, and metabolic acidosis. recent admitted 10/30 through 11/27/2020 with recurrent falls. Imaging 10/31 showing acute burst type compression fracture involving L5 vertebral body, acute to subacute fracture of right transverse process of L1, central to right subarticular disc protrusion L5-S1, chronic T12 fx. Neurosurgery consulted; recommended no surgery and brace for comfort only.  Other significant PMH: sacral decubitus ulcer, DM2, interstitial cystitis.  Clinical Impression  Pt admitted with above. Pt reports 8-9 falls since her recent discharge from the hospital 11/27/2020. Pt denies back pain, however reports right hip pain with ambulation. Pt requiring min assist for functional mobility. Ambulating 15 feet with a walker to the sink and back, demonstrating a step to, heavily antalgic gait pattern. She presents as a significant high fall risk based on recurrent falls and decreased gait speed. Pt now seems agreeable to ST SNF. Highly recommend post acute rehab to address deficits, maximize functional mobility and decrease caregiver burden.      Recommendations for follow up therapy are one component of a multi-disciplinary discharge planning process, led by the attending physician.  Recommendations may be updated based on patient status, additional functional criteria and insurance authorization.  Follow Up Recommendations Skilled nursing-short term rehab (<3 hours/day)    Assistance Recommended at Discharge Frequent or constant Supervision/Assistance  Functional Status Assessment Patient has had a recent decline in their functional status and demonstrates the ability to make significant improvements in function in a reasonable and predictable amount of time.  Equipment  Recommendations  None recommended by PT (pt well equipped)    Recommendations for Other Services       Precautions / Restrictions Precautions Precautions: Fall;Back Precaution Comments: Back for comfort in setting of recent compression fractures Restrictions Weight Bearing Restrictions: No      Mobility  Bed Mobility               General bed mobility comments: OOB in chair    Transfers Overall transfer level: Needs assistance Equipment used: Rolling walker (2 wheels) Transfers: Sit to/from Stand Sit to Stand: Min guard           General transfer comment: close min guard assist    Ambulation/Gait Ambulation/Gait assistance: Min assist Gait Distance (Feet): 15 Feet Assistive device: Rolling walker (2 wheels) Gait Pattern/deviations: Step-to pattern;Decreased stance time - right;Decreased weight shift to right;Antalgic Gait velocity: decreased Gait velocity interpretation: <1.31 ft/sec, indicative of household ambulator   General Gait Details: heavily antalgic gait pattern, increased trunk/hip flexion, able to correct minimally with cues. cues also provided for rolling walker rather than picking it up  Stairs            Wheelchair Mobility    Modified Rankin (Stroke Patients Only)       Balance Overall balance assessment: Needs assistance Sitting-balance support: Feet supported;Single extremity supported;Bilateral upper extremity supported Sitting balance-Leahy Scale: Fair     Standing balance support: Bilateral upper extremity supported;During functional activity Standing balance-Leahy Scale: Poor Standing balance comment: requiring min guard A for brushing teeth at sink                             Pertinent Vitals/Pain Pain Assessment: Faces Faces Pain Scale: Hurts even more Pain Location: R hip, toes Pain Descriptors / Indicators: Aching;Guarding;Grimacing Pain  Intervention(s): Limited activity within patient's  tolerance;Monitored during session    Home Living Family/patient expects to be discharged to:: Private residence Living Arrangements: Spouse/significant other Available Help at Discharge: Family Type of Home: House Home Access: Stairs to enter Entrance Stairs-Rails: Right Entrance Stairs-Number of Steps: 2   Home Layout: Two level;Full bath on main level;Able to live on main level with bedroom/bathroom Home Equipment: Rolling Walker (2 wheels);Cane - quad;Grab bars - toilet;Grab bars - tub/shower;Wheelchair - manual;BSC/3in1      Prior Function Prior Level of Function : History of Falls (last six months);Needs assist             Mobility Comments: h/o falls, uses RW vs w/c, limited household ambulator ADLs Comments: requires assist     Hand Dominance   Dominant Hand: Right    Extremity/Trunk Assessment   Upper Extremity Assessment Upper Extremity Assessment: Defer to OT evaluation    Lower Extremity Assessment Lower Extremity Assessment: RLE deficits/detail;LLE deficits/detail RLE Deficits / Details: grossly 4-/5 strength LLE Deficits / Details: grossly 4-/5 strength    Cervical / Trunk Assessment Cervical / Trunk Assessment: Kyphotic  Communication   Communication: No difficulties  Cognition Arousal/Alertness: Awake/alert Behavior During Therapy: WFL for tasks assessed/performed Overall Cognitive Status: Impaired/Different from baseline Area of Impairment: Safety/judgement                         Safety/Judgement: Decreased awareness of deficits;Decreased awareness of safety              General Comments      Exercises General Exercises - Lower Extremity Long Arc Quad: Both;10 reps;Seated Hip Flexion/Marching: Both;10 reps;Seated Toe Raises: Both;20 reps;Seated   Assessment/Plan    PT Assessment Patient needs continued PT services  PT Problem List Decreased strength;Decreased activity tolerance;Decreased balance;Decreased  mobility;Decreased knowledge of use of DME;Decreased knowledge of precautions;Pain       PT Treatment Interventions DME instruction;Gait training;Functional mobility training;Therapeutic activities;Therapeutic exercise;Stair training;Balance training;Patient/family education    PT Goals (Current goals can be found in the Care Plan section)  Acute Rehab PT Goals Patient Stated Goal: to stop falling PT Goal Formulation: With patient Time For Goal Achievement: 12/21/20 Potential to Achieve Goals: Good    Frequency Min 3X/week   Barriers to discharge        Co-evaluation               AM-PAC PT "6 Clicks" Mobility  Outcome Measure Help needed turning from your back to your side while in a flat bed without using bedrails?: A Little Help needed moving from lying on your back to sitting on the side of a flat bed without using bedrails?: A Little Help needed moving to and from a bed to a chair (including a wheelchair)?: A Little Help needed standing up from a chair using your arms (e.g., wheelchair or bedside chair)?: A Little Help needed to walk in hospital room?: A Little Help needed climbing 3-5 steps with a railing? : Total 6 Click Score: 16    End of Session Equipment Utilized During Treatment: Gait belt Activity Tolerance: Patient tolerated treatment well Patient left: in chair;with call bell/phone within reach;with chair alarm set Nurse Communication: Mobility status PT Visit Diagnosis: Unsteadiness on feet (R26.81);Muscle weakness (generalized) (M62.81);Difficulty in walking, not elsewhere classified (R26.2);Pain Pain - Right/Left: Right Pain - part of body: Hip    Time: 0926-0950 PT Time Calculation (min) (ACUTE ONLY): 24 min   Charges:   PT  Evaluation $PT Eval Moderate Complexity: 1 Mod PT Treatments $Gait Training: 8-22 mins        [name], PT, DPT Acute Rehabilitation Services Pager [phone] Office [phone]   [name] 12/07/2020, 10:39 AM

## 2020-12-08 LAB — GLUCOSE, CAPILLARY
Glucose-Capillary: 150 mg/dL — ABNORMAL HIGH (ref 70–99)
Glucose-Capillary: 176 mg/dL — ABNORMAL HIGH (ref 70–99)
Glucose-Capillary: 181 mg/dL — ABNORMAL HIGH (ref 70–99)
Glucose-Capillary: 224 mg/dL — ABNORMAL HIGH (ref 70–99)

## 2020-12-08 LAB — SARS CORONAVIRUS 2 (TAT 6-24 HRS): SARS Coronavirus 2: NEGATIVE

## 2020-12-08 MED ORDER — GLIPIZIDE ER 2.5 MG PO TB24: 2.5000 mg | ORAL_TABLET | Freq: Every day | ORAL | 1 refills | Status: DC

## 2020-12-08 MED ORDER — LOPERAMIDE HCL 2 MG PO CAPS: 2.0000 mg | ORAL_CAPSULE | ORAL | 0 refills | Status: DC

## 2020-12-08 MED ORDER — GABAPENTIN 300 MG PO CAPS: 300.0000 mg | ORAL_CAPSULE | Freq: Two times a day (BID) | ORAL | 0 refills | Status: AC

## 2020-12-08 MED ORDER — LOPERAMIDE HCL 2 MG PO CAPS
2.0000 mg | ORAL_CAPSULE | ORAL | Status: DC
  Administered 2020-12-08 – 2020-12-09 (×8): 2 mg via ORAL
  Filled 2020-12-08 (×9): qty 1

## 2020-12-08 MED ORDER — HYDROXYZINE HCL 25 MG PO TABS: 25.0000 mg | ORAL_TABLET | Freq: Every evening | ORAL | 0 refills | Status: DC | PRN

## 2020-12-08 MED ORDER — CALCIUM CARBONATE ANTACID 500 MG PO CHEW: 800.0000 mg | CHEWABLE_TABLET | Freq: Two times a day (BID) | ORAL | Status: DC

## 2020-12-08 NOTE — Plan of Care (Signed)

## 2020-12-08 NOTE — Discharge Summary (Addendum)
Physician Discharge Summary  Jillian Leblanc DOB: 05-13-1945 DOA: 12/04/2020  PCP: Guadlupe Spanish, MD  Admit date: 12/04/2020 Discharge date: 12/09/2020  Addendum on 12/09/2020: -[name] waiting to be discharged to SNF once bed is available.  She is currently medically stable for discharge.   Time spent: 33 minutes  Recommendations for Outpatient Follow-up:  Needs bmet and cbc 1 week Rec OP work-up for periph neuropathy vs trial gabapentin once renal function stabilized Will need Foley voiding trial at [facility] and possible referral back to Dr. Amalia Hailey of [facility] as OP for obstructive uropathy as op Consider OP work-up Colonoscopy in several weeks once stabilizes from hospital stay for Heme + stool  Discharge Diagnoses:  MAIN problem for hospitalization   AKI likely 2/2 diarrheal illness  Please see below for itemized issues addressed in [place]- refer to other progress notes for clarity if needed  Discharge Condition:  improved  Diet recommendation:  Renal hh  Filed Weights   12/07/20 0400 12/08/20 0500 12/08/20 0635  Weight: 89.3 kg 85.3 kg 88.1 kg    History of present illness:  75 year old white female known history 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 sick sinus syndrome multiple falls She has been admitted in August and most recently 10/30 through 11/27/2020 for falls-at last admission he had 5 compression fracture which was managed nonoperatively she also had mild AKI and some encephalopathy at times--she was discharged with urinary catheter secondary to bilateral hydronephrosis and retention Patient returned to the hospital 11/11 X 2 days with decreased urine output CBGs in the 108s, and found to have AKI creatinine 3.7 hypocalcemia in addition and metabolic acidosis  Hospital Course:  Hyponatremia, hypovolemic AKI prior to admission likely prerenal azotemia Improved on d/c follow-up on d/c Dr. Cecile Hearing urology Received IVF during hospital stay  and improved significantly on d/c Subacute diarrhea             Has been going on for several weeks.  Stool bacame loose over 24 hours to the point of needing Flexiseal which was removed after resolution diarr Hemoccult is +, but hemoglobin stable and this can be worked up as OP GI path panel neg--good response to Imodium which will be Rx on d/c Hypocalcemia Replace again with calcium gluconate 1g Check am mag and replace as well if low PTH level normal--no further workup Replace orally on d/c Interstitial cystitis Has seen Dr. Amalia Hailey chronically Dm ty ii complicated by severe hypoglycemia on admit Hold glipizide XL 2.5g- gabapentin 100 qd was changed back to usual dosing on d/c Will resume oral glipizide at d.c Recent vertebral fracture in  a setting of multiple falls Monitor mobility in next 24-48 h PT rec's SNF Sacral decubitus present on admit Dressings and turning as per protocol   Discharge Exam: Vitals:   12/08/20 0635 12/08/20 0912  BP: (!) 150/52 (!) 145/60  Pulse: 65 66  Resp: 18   Temp: 98.6 F (37 C) 98.2 F (36.8 C)  SpO2: 99% 98%    Subj on day of d/c   Awake coherent pleasant in nad  Well overall no diarr No cp Some neuropathic like discomfort  General Exam on discharge  Eomi ncat thick neck, obese Abd soft nt nd no rebound  S1 s2 no m/r/g Cta b no rales no rhonchi no periph edema Neuro intact  Discharge Instructions   Discharge Instructions     Diet - low sodium heart healthy   Complete by: As directed  Increase activity slowly   Complete by: As directed    No wound care   Complete by: As directed       Allergies as of 12/08/2020       Reactions   Pentosan Polysulfate Sodium    Other reaction(s): Other (See Comments) ELMIRON Y Drug hair fell out 10/21/2009 12:00:00 AM by [name] CNA 1   Elmiron [pentosan Polysulfate]    Naloxone Other (See Comments)   Hallucinations Confusion Nightmares        Medication List      STOP taking these medications    Aspirin Low Dose 81 MG EC tablet Generic drug: aspirin   colchicine 0.6 MG tablet   diclofenac Sodium 1 % Gel Commonly known as: VOLTAREN   feeding supplement Liqd   ferrous sulfate 325 (65 FE) MG tablet   nystatin cream Commonly known as: MYCOSTATIN   polyethylene glycol 17 g packet Commonly known as: MIRALAX / GLYCOLAX   tizanidine 2 MG capsule Commonly known as: ZANAFLEX       TAKE these medications    calcium carbonate 500 MG chewable tablet Commonly known as: TUMS - dosed in mg elemental calcium Chew 4 tablets (800 mg of elemental calcium total) by mouth 2 (two) times daily.   calcium-vitamin D 250-100 MG-UNIT tablet Take 1 tablet by mouth 2 (two) times daily.   gabapentin 300 MG capsule Commonly known as: NEURONTIN Take 1 capsule (300 mg total) by mouth 2 (two) times daily.   glipiZIDE 2.5 MG 24 hr tablet Commonly known as: GLUCOTROL XL Take 1 tablet (2.5 mg total) by mouth daily with breakfast.   hydrOXYzine 25 MG tablet Commonly known as: ATARAX/VISTARIL Take 1 tablet (25 mg total) by mouth at bedtime as needed for anxiety.   levothyroxine 150 MCG tablet Commonly known as: SYNTHROID Take 150 mcg by mouth daily.   loperamide 2 MG capsule Commonly known as: IMODIUM Take 1 capsule (2 mg total) by mouth every 4 (four) hours.   ondansetron 4 MG disintegrating tablet Commonly known as: ZOFRAN-ODT Take 4 mg by mouth every 8 (eight) hours as needed for nausea or vomiting.   pramipexole 1.5 MG tablet Commonly known as: MIRAPEX Take 1.5 mg by mouth 3 (three) times daily.       Allergies  Allergen Reactions   Pentosan Polysulfate Sodium     Other reaction(s): Other (See Comments) ELMIRON Y Drug hair fell out 10/21/2009 12:00:00 AM by [name] CNA 1   Elmiron [Pentosan Polysulfate]    Naloxone Other (See Comments)    Hallucinations Confusion Nightmares      The results of significant diagnostics from  this hospitalization (including imaging, microbiology, ancillary and laboratory) are listed below for reference.    Significant Diagnostic Studies: DG Lumbar Spine Complete  Result Date: 11/22/2020 CLINICAL DATA:  Status post fall EXAM: LUMBAR SPINE - COMPLETE 4+ VIEW COMPARISON:  CT abdomen pelvis 09/15/2012, CT angiography chest 09/29/2020 FINDINGS: Markedly limited evaluation due to overlapping osseous structures and overlying soft tissues. Five non-rib-bearing lumbar vertebral bodies. Grade 1 anterolisthesis of L5 on S1. Vertebral body height loss of the L5 vertebral body. Interval development of T12 anterior wedge compression fracture. T11 vertebral body height loss. Aortic calcification. Neurostimulator lead overlies the pelvis. Right upper quadrant surgical clips. IMPRESSION: 1. Grade 1 anterolisthesis of L5 on S1. Vertebral body height loss of the L5 vertebral body. Underlying acute component not excluded. Markedly limited evaluation due to overlapping osseous structures and overlying soft tissues. Recommend  cross-sectional imaging for further evaluation. 2. Interval development of a T12 anterior wedge compression fracture. Recommend cross-sectional imaging for further evaluation. 3.  Aortic Atherosclerosis (ICD10-I70.0). Electronically Signed   By: Tish Frederickson M.D.   On: 11/22/2020 20:48   DG Pelvis 1-2 Views  Result Date: 11/22/2020 CLINICAL DATA:  Multiple falls EXAM: PELVIS - 1-2 VIEW COMPARISON:  None. FINDINGS: There is no evidence of pelvic fracture or diastasis. No definite acute displaced fracture or dislocation of bilateral hips. No pelvic bone lesions are seen. IMPRESSION: Negative for acute traumatic injury. Electronically Signed   By: Tish Frederickson M.D.   On: 11/22/2020 20:49   CT HEAD WO CONTRAST ( )  Result Date: 11/26/2020 CLINICAL DATA:  Headache EXAM: CT HEAD WITHOUT CONTRAST TECHNIQUE: Contiguous axial images were obtained from the base of the skull through the  vertex without intravenous contrast. COMPARISON:  CT head 11/22/2020 FINDINGS: Brain: No evidence of acute infarction, hemorrhage, hydrocephalus, extra-axial collection or mass lesion/mass effect. Vascular: No hyperdense vessel or unexpected calcification. Skull: Normal. Negative for fracture or focal lesion. Sinuses/Orbits: No acute finding. Other: Left frontal scalp hematoma similar to previous. IMPRESSION: No acute intracranial process identified. Left frontal scalp hematoma. Electronically Signed   By: Jannifer Hick M.D.   On: 11/26/2020 11:33   CT Head Wo Contrast  Result Date: 11/22/2020 CLINICAL DATA:  Head trauma, minor. Facial trauma. Neck trauma. Additional history provided: Frequent falls. EXAM: CT HEAD WITHOUT CONTRAST CT MAXILLOFACIAL WITHOUT CONTRAST CT CERVICAL SPINE WITHOUT CONTRAST TECHNIQUE: Multidetector CT imaging of the head, cervical spine, and maxillofacial structures were performed using the standard protocol without intravenous contrast. Multiplanar CT image reconstructions of the cervical spine and maxillofacial structures were also generated. COMPARISON:  CT of the head, cervical spine and maxillofacial structures 09/13/2020. FINDINGS: CT HEAD FINDINGS Brain: Cerebral volume is normal for age. There is no acute intracranial hemorrhage. No demarcated cortical infarct. No extra-axial fluid collection. No evidence of an intracranial mass. No midline shift. Vascular: No hyperdense vessel.  Atherosclerotic calcifications. Skull: Normal. Negative for fracture or focal lesion. Other: Left frontotemporal scalp hematoma. CT MAXILLOFACIAL FINDINGS Osseous: No evidence of acute maxillofacial fracture. Orbits: No acute or significant orbital finding. Sinuses: No significant paranasal sinus disease. Soft tissues: Left frontotemporal scalp hematoma. CT CERVICAL SPINE FINDINGS Alignment: Straightening of the expected cervical lordosis. Slight cervical levocurvature. No significant  spondylolisthesis. Skull base and vertebrae: The basion-dental and atlanto-dental intervals are maintained.No evidence of acute fracture to the cervical spine. Soft tissues and spinal canal: No prevertebral fluid or swelling. No visible canal hematoma. Disc levels: Cervical spondylosis with multilevel disc space narrowing, disc bulges/central disc protrusions, uncovertebral hypertrophy and facet arthrosis. Disc space narrowing is greatest at C6-C7 (moderate/advanced at this level). No appreciable high-grade spinal canal stenosis. No high-grade bony neural foraminal narrowing. Upper chest: Patchy ground-glass opacity within the imaged lung apices, nonspecific but possibly reflecting edema or infection. IMPRESSION: CT head: 1. No evidence of acute intracranial abnormality. 2. Left frontotemporal scalp hematoma. CT maxillofacial: 1. No evidence of acute maxillofacial fracture. 2. Left frontotemporal scalp hematoma. CT cervical spine: 1. No evidence of acute fracture to the cervical spine 2. Straightening of the expected cervical lordosis. 3. Slight cervical levocurvature. 4. Cervical spondylosis, as described. 5. Patchy ground-glass opacity within the imaged lung apices, nonspecific but possibly reflecting edema or infection. Clinical correlation is recommended. Additionally, dedicated chest radiographs are recommended for further evaluation. Electronically Signed   By: Jackey Loge D.O.   On: 11/22/2020 20:39  CT Cervical Spine Wo Contrast  Result Date: 11/22/2020 CLINICAL DATA:  Head trauma, minor. Facial trauma. Neck trauma. Additional history provided: Frequent falls. EXAM: CT HEAD WITHOUT CONTRAST CT MAXILLOFACIAL WITHOUT CONTRAST CT CERVICAL SPINE WITHOUT CONTRAST TECHNIQUE: Multidetector CT imaging of the head, cervical spine, and maxillofacial structures were performed using the standard protocol without intravenous contrast. Multiplanar CT image reconstructions of the cervical spine and maxillofacial  structures were also generated. COMPARISON:  CT of the head, cervical spine and maxillofacial structures 09/13/2020. FINDINGS: CT HEAD FINDINGS Brain: Cerebral volume is normal for age. There is no acute intracranial hemorrhage. No demarcated cortical infarct. No extra-axial fluid collection. No evidence of an intracranial mass. No midline shift. Vascular: No hyperdense vessel.  Atherosclerotic calcifications. Skull: Normal. Negative for fracture or focal lesion. Other: Left frontotemporal scalp hematoma. CT MAXILLOFACIAL FINDINGS Osseous: No evidence of acute maxillofacial fracture. Orbits: No acute or significant orbital finding. Sinuses: No significant paranasal sinus disease. Soft tissues: Left frontotemporal scalp hematoma. CT CERVICAL SPINE FINDINGS Alignment: Straightening of the expected cervical lordosis. Slight cervical levocurvature. No significant spondylolisthesis. Skull base and vertebrae: The basion-dental and atlanto-dental intervals are maintained.No evidence of acute fracture to the cervical spine. Soft tissues and spinal canal: No prevertebral fluid or swelling. No visible canal hematoma. Disc levels: Cervical spondylosis with multilevel disc space narrowing, disc bulges/central disc protrusions, uncovertebral hypertrophy and facet arthrosis. Disc space narrowing is greatest at C6-C7 (moderate/advanced at this level). No appreciable high-grade spinal canal stenosis. No high-grade bony neural foraminal narrowing. Upper chest: Patchy ground-glass opacity within the imaged lung apices, nonspecific but possibly reflecting edema or infection. IMPRESSION: CT head: 1. No evidence of acute intracranial abnormality. 2. Left frontotemporal scalp hematoma. CT maxillofacial: 1. No evidence of acute maxillofacial fracture. 2. Left frontotemporal scalp hematoma. CT cervical spine: 1. No evidence of acute fracture to the cervical spine 2. Straightening of the expected cervical lordosis. 3. Slight cervical  levocurvature. 4. Cervical spondylosis, as described. 5. Patchy ground-glass opacity within the imaged lung apices, nonspecific but possibly reflecting edema or infection. Clinical correlation is recommended. Additionally, dedicated chest radiographs are recommended for further evaluation. Electronically Signed   By: Jackey Loge D.O.   On: 11/22/2020 20:39   CT Thoracic Spine Wo Contrast  Result Date: 11/23/2020 CLINICAL DATA:  Initial evaluation for acute trauma, evaluate for spine fracture. EXAM: CT THORACIC SPINE WITHOUT CONTRAST TECHNIQUE: Multidetector CT images of the thoracic were obtained using the standard protocol without intravenous contrast. COMPARISON:  Prior radiograph from earlier the same day. FINDINGS: Alignment: Straightening of the normal thoracic kyphosis. No listhesis. Vertebrae: Compression deformity involving the superior endplate of T12 with up to approximate 40-50% height loss and trace bony retropulsion, chronic in appearance. Minimal chronic height loss noted at the superior endplates of T3 and T4 as well. No acute fracture. Visualized ribs intact. No discrete or worrisome osseous lesions. Paraspinal and other soft tissues: Paraspinous soft tissues demonstrate no acute finding. Scattered atelectatic changes noted within the visualized lungs. Left-sided pacemaker/AICD in place, partially visualized. Left shoulder arthroplasty partially visualized. Aortic atherosclerosis. Disc levels: No significant disc pathology for patient age. No significant stenosis or neural impingement. IMPRESSION: 1. No acute traumatic injury within the thoracic spine. 2. Chronic compression deformity involving the superior endplate of T12 with up to 40-50% height loss and trace bony retropulsion. 3. Minimal chronic height loss at the superior endplates of T3 and T4. Aortic Atherosclerosis (ICD10-I70.0). Electronically Signed   By: Rise Mu M.D.   On: 11/23/2020  01:05   CT Lumbar Spine Wo  Contrast  Result Date: 11/23/2020 CLINICAL DATA:  Initial evaluation for acute low back pain status post trauma. EXAM: CT LUMBAR SPINE WITHOUT CONTRAST TECHNIQUE: Multidetector CT imaging of the lumbar spine was performed without intravenous contrast administration. Multiplanar CT image reconstructions were also generated. COMPARISON:  Prior radiograph from 11/22/2020. FINDINGS: Segmentation: Standard. Lowest well-formed disc space labeled the L5-S1 level. Alignment: Trace retrolisthesis of L1 on L2. Alignment otherwise normal with preservation of the normal lumbar lordosis. Vertebrae: There is an acute burst type compression fracture involving the L5 vertebral body. Associated mild central height loss of up to 35% without bony retropulsion. There is an additional acute to subacute fracture of the right transverse process of L1 (series 3, image 35). No other acute fracture within the lumbar spine. Chronic T12 fracture noted, described on corresponding thoracic spine CT. Visualized sacrum and pelvis intact. SI joints symmetric and normal. Subcentimeter benign bone island noted within the left sacral ala. No other discrete or worrisome osseous lesions. Visualized lower ribs intact. Paraspinal and other soft tissues mild paraspinous edema adjacent to the L1 compression fracture. Paraspinous soft tissues demonstrate no other acute finding. Aorto bi-iliac atherosclerotic disease. Electrode for sacral stimulator noted. Disc levels: L1-2: Degenerative intervertebral disc space narrowing with diffuse disc bulge and disc desiccation. Mild facet hypertrophy. No spinal stenosis. Foramina remain patent. L2-3: Mild disc bulge, eccentric to the right. Mild facet hypertrophy. No significant spinal stenosis. Foramina remain patent. L3-4: Mild disc bulge, asymmetric to the right. Mild to moderate right worse than left facet hypertrophy. No significant spinal stenosis. Mild bilateral foraminal narrowing. L4-5: Mild disc bulge.  Moderate facet hypertrophy. Mild narrowing of the lateral recesses bilaterally. Mild bilateral L4 foraminal stenosis. L5-S1: Degenerative intervertebral disc space narrowing with disc desiccation and diffuse disc bulge. Superimposed central to right subarticular disc protrusion closely approximates the descending S1 nerve roots, greater on the right. Mild facet hypertrophy probable mild right lateral recess stenosis. Central canal remains patent. Moderate bilateral foraminal narrowing. IMPRESSION: 1. Acute burst type compression fracture involving the L5 vertebral body with up to 35% central height loss without bony retropulsion. 2. Acute to subacute fracture of the right transverse process of L1. 3. No other acute traumatic injury within the lumbar spine. 4. Central to right subarticular disc protrusion at L5-S1, closely approximating and potentially affecting either of the descending S1 nerve roots. 5. Chronic T12 fracture, described on corresponding thoracic spine CT. Aortic Atherosclerosis (ICD10-I70.0). Electronically Signed   By: Jeannine Boga M.D.   On: 11/23/2020 01:16   US Renal  Result Date: 12/04/2020 CLINICAL DATA:  Decreased urine output EXAM: RENAL / URINARY TRACT ULTRASOUND COMPLETE COMPARISON:  None. FINDINGS: Right Kidney: Renal measurements: 9.2 x 5.1 x 4.4 cm = volume: 108 mL. There is moderate renal cortical atrophy and cortical scarring, similar to that noted on prior examination. Diffusely increased renal cortical echogenicity appears stable, in keeping with underlying medical renal disease. Previously noted hydronephrosis has resolved with mild residual fullness of the right renal pelvis noted. No intrarenal masses or calcifications are seen. No perinephric fluid collections are seen. Left Kidney: Renal measurements: 11.1 x 6.0 x 6.4 cm = volume: 221 mL. Mild left renal cortical thinning and borderline cortical hyperechogenicity is unchanged. No hydronephrosis. No intrarenal masses  or calcifications are seen. Bladder: The bladder is decompressed Other: None. IMPRESSION: Interval bladder decompression with resolution of bilateral hydronephrosis. Stable cortical hyperechogenicity, asymmetrically more severe on the right, suggesting changes of underlying  medical renal disease. Stable bilateral renal cortical atrophy, asymmetrically more severe on the right, with superimposed areas of cortical scarring on the right. Electronically Signed   By: Fidela Salisbury M.D.   On: 12/04/2020 19:52   US RENAL  Result Date: 11/23/2020 CLINICAL DATA:  Acute kidney injury. EXAM: RENAL / URINARY TRACT ULTRASOUND COMPLETE COMPARISON:  Report from abdominopelvic CT 06/08/2018, images not available FINDINGS: Right Kidney: Renal measurements: 9.9 x 5.6 x 5.0 cm = volume: 145 mL. Diffusely increased renal parenchymal echogenicity with cortical thinning. Areas of cortical scarring. There is mild hydronephrosis. No visualized renal stone or focal lesion. Left Kidney: Renal measurements: 10.1 x 6.8 x 5.5 cm = volume: 194 mL. Mild hydronephrosis. Borderline increased renal parenchymal echogenicity. Small cyst on prior CT is not well seen by ultrasound. No visualized stone or suspicious lesion. Bladder: Distended with bladder volume of 1040 cc. No bladder wall thickening or focal abnormality. Neither ureteral jet is seen. Other: None. IMPRESSION: 1. Distended urinary bladder with bladder volume of 1040 cc. Recommend correlation for bladder outlet obstruction. 2. Mild bilateral hydronephrosis may be secondary to bladder distension. 3. Increased right renal parenchyma echogenicity and areas of cortical scarring. Minimally increased left renal parenchymal echogenicity. Electronically Signed   By: Keith Rake M.D.   On: 11/23/2020 20:28   CT HIP LEFT WO CONTRAST  Result Date: 11/26/2020 CLINICAL DATA:  Acute left hip pain. EXAM: CT OF THE LEFT HIP WITHOUT CONTRAST TECHNIQUE: Multidetector CT imaging of the left  hip was performed according to the standard protocol. Multiplanar CT image reconstructions were also generated. COMPARISON:  CT scan 06/08/2018 FINDINGS: The left hip is normally located. Mild degenerative changes but no fracture or AVN. The visualized left hemipelvis bony structures are intact. No pubic rami fractures. The surrounding hip and pelvic musculature are grossly normal by CT. No obvious muscle tear/intramuscular hematoma. No significant intrapelvic abnormalities are identified. There is a Foley catheter noted in the bladder. IMPRESSION: 1. Mild degenerative changes involving the left hip but no fracture or AVN. 2. Grossly normal CT appearance of the surrounding hip and pelvic musculature. Electronically Signed   By: Marijo Sanes M.D.   On: 11/26/2020 08:13   DG Chest Portable 1 View  Result Date: 12/04/2020 CLINICAL DATA:  Cough. EXAM: PORTABLE CHEST 1 VIEW COMPARISON:  Radiograph 11/22/2020.  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 Stable heart size and mediastinal contours. No acute airspace disease. No pulmonary edema, pleural effusion, or pneumothorax. Reverse right shoulder arthroplasty. IMPRESSION: No acute abnormality. Electronically Signed   By: Keith Rake M.D.   On: 12/04/2020 20:07   DG Chest Portable 1 View  Result Date: 11/22/2020 CLINICAL DATA:  Hypoxia EXAM: PORTABLE CHEST 1 VIEW COMPARISON:  09/29/2020 FINDINGS: Lungs are clear. No pneumothorax or pleural effusion. Left subclavian dual lead pacemaker is unchanged. Cardiac size within normal limits. Pulmonary vascularity is normal. Right total shoulder arthroplasty has been performed. IMPRESSION: No active disease. Electronically Signed   By: Fidela Salisbury M.D.   On: 11/22/2020 21:21   DG Foot Complete Right  Result Date: 12/04/2020 CLINICAL DATA:  Right foot pain. EXAM: RIGHT FOOT COMPLETE - 3+ VIEW COMPARISON:  None. FINDINGS: There is no evidence of fracture or dislocation. Hammertoe deformity of the  digits. Midfoot degenerative change with spurring. There is a plantar calcaneal spur. No erosion or bony destruction. Mild soft tissue edema overlies the dorsum of the foot. IMPRESSION: 1. Midfoot degenerative change with spurring. Plantar calcaneal spur. 2. No acute  osseous abnormality. 3. Mild soft tissue edema. Electronically Signed   By: Keith Rake M.D.   On: 12/04/2020 20:06   ECHOCARDIOGRAM COMPLETE  Result Date: 11/24/2020    ECHOCARDIOGRAM REPORT   Patient Name:   Jillian Leblanc Date of Exam: 11/24/2020 Medical Rec #:  [id number]      Height:       60.0 in Accession #:    [id number]     Weight:       210.0 lb Date of Birth:  1945/02/06      BSA:          1.906 m Patient Age:    75 years       BP:           104/72 mmHg Patient Gender: F              HR:           83 bpm. Exam Location:  Inpatient Procedure: 2D Echo, Cardiac Doppler and Color Doppler Indications:    Syncope  History:        Patient has prior history of Echocardiogram examinations.  Sonographer:    Jyl Heinz Referring Phys: [id number] [place]  1. Left ventricular ejection fraction, by estimation, is 60 to 65%. The left ventricle has normal function. The left ventricle has no regional wall motion abnormalities. Left ventricular diastolic parameters are consistent with Grade I diastolic dysfunction (impaired relaxation). Elevated left atrial pressure.  2. Right ventricular systolic function is normal. The right ventricular size is normal.  3. No evidence of mitral valve regurgitation.  4. The aortic valve is tricuspid. Aortic valve regurgitation is not visualized. Mild aortic valve sclerosis is present, with no evidence of aortic valve stenosis. FINDINGS  Left Ventricle: Left ventricular ejection fraction, by estimation, is 60 to 65%. The left ventricle has normal function. The left ventricle has no regional wall motion abnormalities. The left ventricular internal cavity size was normal in size. There is  no left  ventricular hypertrophy. Left ventricular diastolic parameters are consistent with Grade I diastolic dysfunction (impaired relaxation). Elevated left atrial pressure. Right Ventricle: The right ventricular size is normal. No increase in right ventricular wall thickness. Right ventricular systolic function is normal. Left Atrium: Left atrial size was normal in size. Right Atrium: Right atrial size was normal in size. Pericardium: There is no evidence of pericardial effusion. Mitral Valve: Mild mitral annular calcification. No evidence of mitral valve regurgitation. MV peak gradient, 12.2 mmHg. The mean mitral valve gradient is 5.0 mmHg. Tricuspid Valve: The tricuspid valve is normal in structure. Tricuspid valve regurgitation is trivial. Aortic Valve: The aortic valve is tricuspid. Aortic valve regurgitation is not visualized. Mild aortic valve sclerosis is present, with no evidence of aortic valve stenosis. Aortic valve peak gradient measures 13.6 mmHg. Pulmonic Valve: The pulmonic valve was normal in structure. Pulmonic valve regurgitation is not visualized. Aorta: The aortic root and ascending aorta are structurally normal, with no evidence of dilitation. IAS/Shunts: No atrial level shunt detected by color flow Doppler. Additional Comments: A device lead is visualized.  LEFT VENTRICLE PLAX 2D LVIDd:         3.95 cm      Diastology LVIDs:         2.51 cm      LV e' medial:    7.94 cm/s LV PW:         1.09 cm      LV E/e' medial:  17.8 LV IVS:  1.07 cm      LV e' lateral:   7.62 cm/s LVOT diam:     2.00 cm      LV E/e' lateral: 18.5 LV SV:         99 LV SV Index:   52 LVOT Area:     3.14 cm  LV Volumes (MOD) LV vol d, MOD A2C: 123.0 ml LV vol d, MOD A4C: 101.0 ml LV vol s, MOD A2C: 44.4 ml LV vol s, MOD A4C: 37.2 ml LV SV MOD A2C:     78.6 ml LV SV MOD A4C:     101.0 ml LV SV MOD BP:      71.1 ml RIGHT VENTRICLE RV Basal diam:  3.33 cm RV Mid diam:    3.30 cm RV S prime:     15.40 cm/s TAPSE (M-mode): 2.0 cm  LEFT ATRIUM             Index        RIGHT ATRIUM           Index LA diam:        4.20 cm 2.20 cm/m   RA Area:     12.10 cm 6.35 cm/m LA Vol (A2C):   59.7 ml 31.33 ml/m LA Vol (A4C):   47.4 ml 24.87 ml/m LA Biplane Vol: 54.4 ml 28.55 ml/m  AORTIC VALVE AV Area (Vmax): 2.33 cm AV Vmax:        184.50 cm/s AV Peak Grad:   13.6 mmHg LVOT Vmax:      137.00 cm/s LVOT Vmean:     107.000 cm/s LVOT VTI:       0.314 m  AORTA Ao Root diam: 2.80 cm Ao Asc diam:  3.50 cm MITRAL VALVE                TRICUSPID VALVE MV Area (PHT): 3.65 cm     TR Peak grad:   38.7 mmHg MV Area VTI:   2.34 cm     TR Vmax:        311.00 cm/s MV Peak grad:  12.2 mmHg MV Mean grad:  5.0 mmHg     SHUNTS MV Vmax:       1.75 m/s     Systemic VTI:  0.31 m MV Vmean:      104.0 cm/s   Systemic Diam: 2.00 cm MV Decel Time: 208 msec MV E velocity: 141.00 cm/s MV A velocity: 161.00 cm/s MV E/A ratio:  0.88 Dorris Carnes MD Electronically signed by Dorris Carnes MD Signature Date/Time: 11/24/2020/1:38:22 PM    Final    CUP PACEART REMOTE DEVICE CHECK  Result Date: 11/17/2020 Scheduled remote reviewed. Normal device function.  10 NSVT, 1 fast AV.  EGM's show 1-2sec bursts of SVT Next remote 91 days. LR  DG Hip Unilat W or Wo Pelvis 2-3 Views Right  Result Date: 12/04/2020 CLINICAL DATA:  Hip pain. EXAM: DG HIP (WITH OR WITHOUT PELVIS) 2-3V RIGHT COMPARISON:  Pelvis radiograph 11/22/2020 FINDINGS: The bones are diffusely under mineralized. Mild bilateral hip degenerative change. No evidence of fracture, erosion, avascular necrosis or focal bone abnormality. Pre sacral stimulator in place. IMPRESSION: Mild bilateral hip degenerative change.  No acute findings. Electronically Signed   By: Keith Rake M.D.   On: 12/04/2020 20:09   CT Maxillofacial Wo Contrast  Result Date: 11/22/2020 CLINICAL DATA:  Head trauma, minor. Facial trauma. Neck trauma. Additional history provided: Frequent falls. EXAM: CT HEAD WITHOUT CONTRAST CT MAXILLOFACIAL  WITHOUT CONTRAST CT CERVICAL SPINE WITHOUT CONTRAST TECHNIQUE: Multidetector CT imaging of the head, cervical spine, and maxillofacial structures were performed using the standard protocol without intravenous contrast. Multiplanar CT image reconstructions of the cervical spine and maxillofacial structures were also generated. COMPARISON:  CT of the head, cervical spine and maxillofacial structures 09/13/2020. FINDINGS: CT HEAD FINDINGS Brain: Cerebral volume is normal for age. There is no acute intracranial hemorrhage. No demarcated cortical infarct. No extra-axial fluid collection. No evidence of an intracranial mass. No midline shift. Vascular: No hyperdense vessel.  Atherosclerotic calcifications. Skull: Normal. Negative for fracture or focal lesion. Other: Left frontotemporal scalp hematoma. CT MAXILLOFACIAL FINDINGS Osseous: No evidence of acute maxillofacial fracture. Orbits: No acute or significant orbital finding. Sinuses: No significant paranasal sinus disease. Soft tissues: Left frontotemporal scalp hematoma. CT CERVICAL SPINE FINDINGS Alignment: Straightening of the expected cervical lordosis. Slight cervical levocurvature. No significant spondylolisthesis. Skull base and vertebrae: The basion-dental and atlanto-dental intervals are maintained.No evidence of acute fracture to the cervical spine. Soft tissues and spinal canal: No prevertebral fluid or swelling. No visible canal hematoma. Disc levels: Cervical spondylosis with multilevel disc space narrowing, disc bulges/central disc protrusions, uncovertebral hypertrophy and facet arthrosis. Disc space narrowing is greatest at C6-C7 (moderate/advanced at this level). No appreciable high-grade spinal canal stenosis. No high-grade bony neural foraminal narrowing. Upper chest: Patchy ground-glass opacity within the imaged lung apices, nonspecific but possibly reflecting edema or infection. IMPRESSION: CT head: 1. No evidence of acute intracranial abnormality.  2. Left frontotemporal scalp hematoma. CT maxillofacial: 1. No evidence of acute maxillofacial fracture. 2. Left frontotemporal scalp hematoma. CT cervical spine: 1. No evidence of acute fracture to the cervical spine 2. Straightening of the expected cervical lordosis. 3. Slight cervical levocurvature. 4. Cervical spondylosis, as described. 5. Patchy ground-glass opacity within the imaged lung apices, nonspecific but possibly reflecting edema or infection. Clinical correlation is recommended. Additionally, dedicated chest radiographs are recommended for further evaluation. Electronically Signed   By: Kellie Simmering D.O.   On: 11/22/2020 20:39    Microbiology: Recent Results (from the past 240 hour(s))  Resp Panel by RT-PCR (Flu A&B, Covid) Nasopharyngeal Swab     Status: None   Collection Time: 12/04/20  6:24 PM   Specimen: Nasopharyngeal Swab; Nasopharyngeal(NP) swabs in vial transport medium  Result Value Ref Range Status   SARS Coronavirus 2 by RT PCR NEGATIVE NEGATIVE Final    Comment: (NOTE) SARS-CoV-2 target nucleic acids are NOT DETECTED.  The SARS-CoV-2 RNA is generally detectable in upper respiratory specimens during the acute phase of infection. The lowest concentration of SARS-CoV-2 viral copies this assay can detect is 138 copies/mL. A negative result does not preclude SARS-Cov-2 infection and should not be used as the sole basis for treatment or other patient management decisions. A negative result may occur with  improper specimen collection/handling, submission of specimen other than nasopharyngeal swab, presence of viral mutation(s) within the areas targeted by this assay, and inadequate number of viral copies(<138 copies/mL). A negative result must be combined with clinical observations, patient history, and epidemiological information. The expected result is Negative.  Fact Sheet for Patients:  EntrepreneurPulse.com.au  Fact Sheet for Healthcare Providers:   IncredibleEmployment.be  This test is no t yet approved or cleared by the [place] FDA and  has been authorized for detection and/or diagnosis of SARS-CoV-2 by FDA under an Emergency Use Authorization (EUA). This EUA will remain  in effect (meaning this test can be used) for the duration of the COVID-19  declaration under Section 564(b)(1) of the Act, 21 U.S.C.section 360bbb-3(b)(1), unless the authorization is terminated  or revoked sooner.       Influenza A by PCR NEGATIVE NEGATIVE Final   Influenza B by PCR NEGATIVE NEGATIVE Final    Comment: (NOTE) The Xpert Xpress SARS-CoV-2/FLU/RSV plus assay is intended as an aid in the diagnosis of influenza from Nasopharyngeal swab specimens and should not be used as a sole basis for treatment. Nasal washings and aspirates are unacceptable for Xpert Xpress SARS-CoV-2/FLU/RSV testing.  Fact Sheet for Patients: EntrepreneurPulse.com.au  Fact Sheet for Healthcare Providers: IncredibleEmployment.be  This test is not yet approved or cleared by the [place] FDA and has been authorized for detection and/or diagnosis of SARS-CoV-2 by FDA under an Emergency Use Authorization (EUA). This EUA will remain in effect (meaning this test can be used) for the duration of the COVID-19 declaration under Section 564(b)(1) of the Act, 21 U.S.C. section 360bbb-3(b)(1), unless the authorization is terminated or revoked.  Performed at [facility], [place] [address], [place], [place] [address]   C Difficile Quick Screen w PCR reflex     Status: None   Collection Time: 12/06/20  8:01 AM   Specimen: STOOL  Result Value Ref Range Status   C Diff antigen NEGATIVE NEGATIVE Final   C Diff toxin NEGATIVE NEGATIVE Final   C Diff interpretation No C. difficile detected.  Final    Comment: NEGATIVE Performed at [facility], [place] [address] [place], [place] [address]   Gastrointestinal  Panel by PCR , Stool     Status: None   Collection Time: 12/07/20  8:56 AM   Specimen: STOOL  Result Value Ref Range Status   Campylobacter species NOT DETECTED NOT DETECTED Final   Plesimonas shigelloides NOT DETECTED NOT DETECTED Final   Salmonella species NOT DETECTED NOT DETECTED Final   Yersinia enterocolitica NOT DETECTED NOT DETECTED Final   Vibrio species NOT DETECTED NOT DETECTED Final   Vibrio cholerae NOT DETECTED NOT DETECTED Final   Enteroaggregative E coli (EAEC) NOT DETECTED NOT DETECTED Final   Enteropathogenic E coli (EPEC) NOT DETECTED NOT DETECTED Final   Enterotoxigenic E coli (ETEC) NOT DETECTED NOT DETECTED Final   Shiga like toxin producing E coli (STEC) NOT DETECTED NOT DETECTED Final   Shigella/Enteroinvasive E coli (EIEC) NOT DETECTED NOT DETECTED Final   Cryptosporidium NOT DETECTED NOT DETECTED Final   Cyclospora cayetanensis NOT DETECTED NOT DETECTED Final   Entamoeba histolytica NOT DETECTED NOT DETECTED Final   Giardia lamblia NOT DETECTED NOT DETECTED Final   Adenovirus F40/41 NOT DETECTED NOT DETECTED Final   Astrovirus NOT DETECTED NOT DETECTED Final   Norovirus GI/GII NOT DETECTED NOT DETECTED Final   Rotavirus A NOT DETECTED NOT DETECTED Final   Sapovirus (I, II, IV, and V) NOT DETECTED NOT DETECTED Final    Comment: Performed at [facility], [address] [place], [place] [address]     Labs: Basic Metabolic Panel: Recent Labs  Lab 12/04/20 1855 12/05/20 0101 12/05/20 1227 12/06/20 0540 12/06/20 1205 12/07/20 0646  NA  --  121* 126* 126* 125* 129*  K  --  3.4* 3.5 3.1* 3.6 3.5  CL  --  85* 85* 88* 93* 94*  CO2  --  19* 22 24 19* 23  GLUCOSE  --  50* 58* 112* 139* 146*  BUN  --  52* 49* 40* 34* 20  CREATININE  --  3.52* 3.64* 2.53* 2.06* 1.43*  CALCIUM  --  5.8* 6.3* 6.3* 6.8* 6.9*  MG 1.5*  --   --  2.0  --   --   PHOS 7.7*  --   --   --   --  3.0   Liver Function Tests: Recent Labs  Lab 12/04/20 1734 12/07/20 0646   AST 36  --   ALT 28  --   ALKPHOS 182*  --   BILITOT 0.9  --   PROT 7.1  --   ALBUMIN 3.1* 2.9*   No results for input(s): LIPASE, AMYLASE in the last 168 hours. No results for input(s): AMMONIA in the last 168 hours. CBC: Recent Labs  Lab 12/04/20 1734 12/05/20 0101 12/06/20 0540 12/07/20 0646  WBC 8.9 11.8* 5.8 5.6  NEUTROABS 7.5  --   --   --   HGB 11.4* 11.2* 11.0* 11.5*  HCT 33.5* 32.2* 32.8* 35.7*  MCV 87.5 86.6 88.6 90.2  PLT 460* 478* 397 331   Cardiac Enzymes: No results for input(s): CKTOTAL, CKMB, CKMBINDEX, TROPONINI in the last 168 hours. BNP: BNP (last 3 results) Recent Labs    01/10/20 1604  BNP 68.8    ProBNP (last 3 results) No results for input(s): PROBNP in the last 8760 hours.  CBG: Recent Labs  Lab 12/07/20 0637 12/07/20 1156 12/07/20 1729 12/07/20 2147 12/08/20 0635  GLUCAP 110* 174* 139* 149* 150*       Signed:  Nita Sells MD   [facility] 12/08/2020, 11:05 AM

## 2020-12-08 NOTE — Evaluation (Signed)
Occupational Therapy Evaluation Patient Details Name: Jillian Leblanc MRN: [id number] DOB: November 14, 1945 Today's Date: 12/08/2020   History of Present Illness Pt is a 75 y.o. F who presents 12/04/2020 with hyponatremia, hypocalcemia, and metabolic acidosis. recent admitted 10/30 through 11/27/2020 with recurrent falls. Imaging 10/31 showing acute burst type compression fracture involving L5 vertebral body, acute to subacute fracture of right transverse process of L1, central to right subarticular disc protrusion L5-S1, chronic T12 fx. Neurosurgery consulted; recommended no surgery and brace for comfort only.  Other significant PMH: sacral decubitus ulcer, DM2, interstitial cystitis.   Clinical Impression   PTA, pt lives with spouse and reports typically Modified Independent with ADLs/mobility though has required increased assist from husband in recent weeks due to frequent falls. Pt presents with deficits in standing balance, endurance, and overall strength. Pt able to mobilize short distances in room using RW at [place], requires Min A for UB ADLs and Mod A for LB ADLs. With prolonged standing with ADLs at sink, pt began to fatigue with increasing unsteadiness and anxiety (fearful of falling), requiring increased physical assist and encouragement. Recommend ST rehab at SNF prior to return home to maximize safety with daily tasks - pt in agreement for pursuing SNF rehab. Plan to further educate in fall prevention strategies and progress LB ADLs in next sessions.       Recommendations for follow up therapy are one component of a multi-disciplinary discharge planning process, led by the attending physician.  Recommendations may be updated based on patient status, additional functional criteria and insurance authorization.   Follow Up Recommendations  Skilled nursing-short term rehab (<3 hours/day)    Assistance Recommended at Discharge Frequent or constant Supervision/Assistance  Functional Status  Assessment  Patient has had a recent decline in their functional status and demonstrates the ability to make significant improvements in function in a reasonable and predictable amount of time.  Equipment Recommendations  None recommended by OT    Recommendations for Other Services       Precautions / Restrictions Precautions Precautions: Fall;Back;Other (comment) Precaution Comments: Back for comfort in setting of recent compression fractures (had TLSO at home to wear if painful), flexi seal Restrictions Weight Bearing Restrictions: No      Mobility Bed Mobility Overal bed mobility: Needs Assistance Bed Mobility: Supine to Sit     Supine to sit: Supervision;HOB elevated     General bed mobility comments: use of bed rails, increased time    Transfers Overall transfer level: Needs assistance Equipment used: Rolling walker (2 wheels) Transfers: Sit to/from Stand Sit to Stand: Min guard           General transfer comment: close min guard assist      Balance Overall balance assessment: Needs assistance Sitting-balance support: Feet supported;Single extremity supported;Bilateral upper extremity supported Sitting balance-Leahy Scale: Fair     Standing balance support: Bilateral upper extremity supported;During functional activity Standing balance-Leahy Scale: Poor Standing balance comment: reliant on UE support in standing                           ADL either performed or assessed with clinical judgement   ADL Overall ADL's : Needs assistance/impaired Eating/Feeding: Independent;Sitting   Grooming: Min guard;Standing;Oral care;Brushing hair Grooming Details (indicate cue type and reason): min guard for safety progressing to supervision without LOB. Pt able to stand < 6 min for tasks before fatigue reported impacting balance Upper Body Bathing: Minimal assistance;Sitting   Lower Body  Bathing: Moderate assistance;Sit to/from stand   Upper Body  Dressing : Minimal assistance;Sitting   Lower Body Dressing: Moderate assistance;Sit to/from stand   Toilet Transfer: Ambulation;Comfort height toilet;Rolling walker (2 wheels);Minimal assistance     Toileting - Clothing Manipulation Details (indicate cue type and reason): with flexiseal and foley cath at this time     Functional mobility during ADLs: Minimal assistance;Rolling walker (2 wheels) General ADL Comments: pt with deficits in standing tolerance, standing balance with deficits becoming more apparent with increased activity due to pt fatigue.     Vision Baseline Vision/History: 1 Wears glasses Ability to See in Adequate Light: 0 Adequate Patient Visual Report: No change from baseline Vision Assessment?: No apparent visual deficits     Perception     Praxis      Pertinent Vitals/Pain Pain Assessment: Faces Faces Pain Scale: Hurts little more Pain Location: toes Pain Descriptors / Indicators: Burning;Nagging Pain Intervention(s): Monitored during session;Limited activity within patient's tolerance     Hand Dominance Right   Extremity/Trunk Assessment Upper Extremity Assessment Upper Extremity Assessment: Generalized weakness   Lower Extremity Assessment Lower Extremity Assessment: Defer to PT evaluation   Cervical / Trunk Assessment Cervical / Trunk Assessment: Kyphotic   Communication Communication Communication: No difficulties   Cognition Arousal/Alertness: Awake/alert Behavior During Therapy: WFL for tasks assessed/performed Overall Cognitive Status: Impaired/Different from baseline Area of Impairment: Safety/judgement                         Safety/Judgement: Decreased awareness of deficits;Decreased awareness of safety     General Comments: mild decreased safety awareness with cues for DME use, safe pacing and fall prevention strategies     General Comments       Exercises     Shoulder Instructions      Home Living  Family/patient expects to be discharged to:: Private residence Living Arrangements: Spouse/significant other Available Help at Discharge: Family Type of Home: House Home Access: Stairs to enter [employer] of Steps: 2 Entrance Stairs-Rails: Right Home Layout: Two level;Full bath on main level;Able to live on main level with bedroom/bathroom     Bathroom Shower/Tub: Walk-in shower;Tub only   Bathroom Toilet: Handicapped height Bathroom Accessibility: Yes   Home Equipment: Conservation officer, nature (2 wheels);Cane - quad;Grab bars - toilet;Grab bars - tub/shower;Wheelchair - manual;BSC/3in1;Shower seat          Prior Functioning/Environment Prior Level of Function : History of Falls (last six months);Needs assist             Mobility Comments: h/o falls, uses RW vs w/c -reports use of DME new for pt, limited household ambulator ADLs Comments: reports typically able to complete ADLs though increasing difficulty due to falls; assist with IADLs        OT Problem List: Decreased strength;Impaired balance (sitting and/or standing);Decreased knowledge of use of DME or AE;Decreased knowledge of precautions;Pain;Decreased activity tolerance;Decreased safety awareness      OT Treatment/Interventions: Self-care/ADL training;DME and/or AE instruction;Therapeutic activities;Patient/family education;Balance training    OT Goals(Current goals can be found in the care plan section) Acute Rehab OT Goals Patient Stated Goal: reduce falls, go to rehab OT Goal Formulation: With patient Time For Goal Achievement: 12/22/20 Potential to Achieve Goals: Good  OT Frequency: Min 2X/week   Barriers to D/C:            Co-evaluation              AM-PAC OT "6 Clicks" Daily Activity  Outcome Measure Help from another person eating meals?: None Help from another person taking care of personal grooming?: A Little Help from another person toileting, which includes using toliet, bedpan,  or urinal?: A Lot Help from another person bathing (including washing, rinsing, drying)?: A Lot Help from another person to put on and taking off regular upper body clothing?: A Little Help from another person to put on and taking off regular lower body clothing?: A Lot 6 Click Score: 16   End of Session Equipment Utilized During Treatment: Gait belt;Rolling walker (2 wheels) Nurse Communication: Mobility status  Activity Tolerance: Patient tolerated treatment well Patient left: in chair;with call bell/phone within reach;with chair alarm set  OT Visit Diagnosis: Unsteadiness on feet (R26.81);Muscle weakness (generalized) (M62.81);Pain;Repeated falls (R29.6) Pain - Right/Left:  (both) Pain - part of body: Ankle and joints of foot                Time: 3212-2482 OT Time Calculation (min): 34 min Charges:  OT General Charges $OT Visit: 1 Visit OT Evaluation $OT Eval Moderate Complexity: 1 Mod OT Treatments $Self Care/Home Management : 8-22 mins  Bradd Canary, OTR/L Acute Rehab Services Office: [phone]   Lorre Munroe 12/08/2020, 12:10 PM

## 2020-12-09 LAB — GLUCOSE, CAPILLARY
Glucose-Capillary: 171 mg/dL — ABNORMAL HIGH (ref 70–99)
Glucose-Capillary: 176 mg/dL — ABNORMAL HIGH (ref 70–99)

## 2020-12-09 NOTE — Progress Notes (Signed)
Patient ID: Jillian Leblanc, female   DOB: 1945/10/05, 75 y.o.   MRN: [id number] [name] waiting to be discharged to SNF once bed is available.  She is currently medically stable for discharge.  Patient seen and examined at bedside.  Please refer to the full discharge summary done by Dr. Mahala Menghini on 12/08/2020 for full details.  If the patient discharges to SNF today, date of discharge will be 12/09/2020.

## 2020-12-09 NOTE — TOC Transition Note (Signed)
Transition of Care [facility]) - CM/SW Discharge Note *Discharged to [facility] SNF   Patient Details  Name: Jillian Leblanc MRN: [id number] Date of Birth: 02-26-1945  Transition of Care [facility]) CM/SW Contact:  Cristobal Goldmann, LCSW Phone Number: 12/09/2020, 3:26 PM   Clinical Narrative:  Patient medically stable for discharge and going to [facility] for ST rehab. She is being transported by her husband Jillian Leblanc. CSW talked with admissions director Darlina Rumpf regarding patient and discharge summary and transfer report transmitted to facility. Nurse provided with information to call report.   Final next level of care: Skilled Nursing Facility [facility]) Barriers to Discharge: Barriers Resolved   Patient Goals and CMS Choice Patient states their goals for this hospitalization and ongoing recovery are:: Patient agreeable to ST rehab the home CMS Medicare.gov Compare Post Acute Care list provided to:: Patient Choice offered to / list presented to : Patient  Discharge Placement PASRR number recieved: 12/09/20 ([id number])            Patient chooses bed at: [facility] Patient to be transferred to facility by: Her husband Name of family member notified: Patient called her husband to let him know he could come to hospital Patient and family notified of of transfer: 12/09/20  Discharge Plan and Services In-house Referral: Clinical Social Work                DME Agency: NA                  Social Determinants of Health (SDOH) Interventions  No SDOH interventions requested or needed at discharge.   Readmission Risk Interventions No flowsheet data found.

## 2020-12-09 NOTE — TOC Initial Note (Signed)
Transition of Care [facility]) - Initial/Assessment Note    Patient Details  Name: Jillian Leblanc MRN: [id number] Date of Birth: 1945/09/25  Transition of Care [facility]) CM/SW Contact:    Cristobal Goldmann, LCSW Phone Number: 12/09/2020, 1:43 PM  Clinical Narrative:  CSW talked with patient at bedside regarding her discharge disposition and the recommendation of short-term rehab. Jillian Leblanc was sitting in a chair and was alert, oriented and willing to talk with CSW. Patient reported that she has 2 sons from a previous marriage and 1 daughter from her marriage to her current spouse. She has 10 grandchildren and 6 great-grandchildren and this was discussed. She added that all of her children live in [place] [facility].   Patient informed of therapy recommendations for ST rehab, and when asked responded that she has never been to a facility for ST rehab. This was discussed and Jillian Leblanc was agreeable and her facility preference is [facility]. She was also agreeable to CSW sending her information out to [facility] in [facility]. Mrs. Guereca was provided with the [facility] SNF list.                 Expected Discharge Plan: Skilled Nursing Facility [facility]) Barriers to Discharge: Barriers Resolved   Patient Goals and CMS Choice Patient states their goals for this hospitalization and ongoing recovery are:: Patient agreeable to ST rehab, then home CMS Medicare.gov Compare Post Acute Care list provided to:: Patient Choice offered to / list presented to : Patient  Expected Discharge Plan and Services Expected Discharge Plan: Skilled Nursing Facility [facility]) In-house Referral: Clinical Social Work     Living arrangements for the past 2 months: Single Family Home Expected Discharge Date: 12/08/20                                    Prior Living Arrangements/Services Living arrangements for the past 2 months: Single Family Home Lives with:: Spouse Patient  language and need for interpreter reviewed:: No Do you feel safe going back to the place where you live?: Yes (Patient feels safe going home, however is agreeable to ST rehab)      Need for Family Participation in Patient Care: Yes (Comment) Care giver support system in place?: Yes (comment)   Criminal Activity/Legal Involvement Pertinent to Current Situation/Hospitalization: No - Comment as needed  Activities of Daily Living Home Assistive Devices/Equipment: Walker (specify type) ADL Screening (condition at time of admission) Patient's cognitive ability adequate to safely complete daily activities?: Yes Is the patient deaf or have difficulty hearing?: No Does the patient have difficulty seeing, even when wearing glasses/contacts?: No Does the patient have difficulty concentrating, remembering, or making decisions?: No Patient able to express need for assistance with ADLs?: Yes Does the patient have difficulty dressing or bathing?: Yes Independently performs ADLs?: Yes (appropriate for developmental age) Communication: Independent Dressing (OT): Independent with device (comment) Is this a change from baseline?: Pre-admission baseline Grooming: Appropriate for developmental age Feeding: Appropriate for developmental age Bathing: Appropriate for developmental age Is this a change from baseline?: Pre-admission baseline Toileting: Needs assistance Is this a change from baseline?: Pre-admission baseline In/Out Bed: Needs assistance Is this a change from baseline?: Pre-admission baseline Walks in Home: Needs assistance Is this a change from baseline?: Pre-admission baseline Does the patient have difficulty walking or climbing stairs?: Yes Weakness of Legs: Both Weakness of Arms/Hands: Both  Permission Sought/Granted Permission sought to  share information with : Family Supports    Share Information with NAME: Regenna Tedrick     Permission granted to share info w Relationship:  Husband  Permission granted to share info w Contact Information: [phone]  Emotional Assessment Appearance:: Appears stated age Attitude/Demeanor/Rapport: Engaged Affect (typically observed): Appropriate Orientation: : Oriented to Place, Oriented to Situation, Oriented to Self Alcohol / Substance Use: Tobacco Use, Alcohol Use (Per H&P, patient has never smoked and does not drink or use illict drugs) Psych Involvement: No (comment)  Admission diagnosis:  Hypocalcemia [E83.51] Hyponatremia [E87.1] Prolonged Q-T interval on ECG [R94.31] AKI (acute kidney injury) ([place]) [N17.9] Patient Active Problem List   Diagnosis Date Noted   Prolonged QT interval 12/04/2020   Chronic pain 12/04/2020   Fall    Compression of lumbar vertebra (HCC)    Type 2 diabetes mellitus with hypoglycemia without coma (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 [place] [id number] [place] [address] Phone: [phone] Fax: [phone]     Social Determinants of Health (SDOH) Interventions  No SDOH interventions requested or needed at this time  Readmission Risk Interventions No flowsheet data found.

## 2020-12-09 NOTE — Progress Notes (Signed)
DISCHARGE NOTE SN[facility] Patient verbalized understanding.Report given to [facility] R.N.  Skin clean, dry and intact without evidence of skin break down, no evidence of skin tears noted. IV catheter discontinued intact. Site without signs and symptoms of complications. Dressing and pressure applied. Pt denies pain at the site currently. No complaints noted.  Patent is discharging with Foley catheter as per MD ordered.  Discharge packet assembled. An After Visit Summary (AVS) was printed and given to the EMS personnel. Patient escorted via stretcher and discharged to [facility] via ambulance. Report called to accepting facility; all questions and concerns addressed.   Benson, Kem Kays, RN

## 2020-12-09 NOTE — NC FL2 (Signed)
[place] MEDICAID FL2 LEVEL OF CARE SCREENING TOOL     IDENTIFICATION  Patient Name: Jillian Leblanc Birthdate: 03-23-1945 Sex: female Admission Date (Current Location): 12/04/2020  [facility] and [place] Number:  Producer, television/film/video and Address:  [facility], [address], [place], [place] [address]      Provider Number: [id number]  Attending Physician Name and Address:  Glade Lloyd, MD  Relative Name and Phone Number:  Evalene Vath - spouse - [phone]    Current Level of Care: Hospital Recommended Level of Care: Skilled Nursing Facility Prior Approval Number:    Date Approved/Denied:   PASRR Number: [id number] (Eff. 12/09/20)  Discharge Plan: SNF    Current Diagnoses: Patient Active Problem List   Diagnosis Date Noted   Prolonged QT interval 12/04/2020   Chronic pain 12/04/2020   Fall    Compression of lumbar vertebra (HCC)    Type 2 diabetes mellitus with hypoglycemia without coma (HCC)    Hypothyroidism    Syncope 11/23/2020   AKI (acute kidney injury) (HCC) 11/22/2020   Multiple falls 09/14/2020   Pressure injury of skin 09/14/2020   Generalized weakness 09/14/2020   Hyponatremia 07/26/2020   Closed displaced fracture of coracoid process of right shoulder 07/26/2020   Diabetes mellitus type 2 in obese (HCC) 07/26/2020   Elevated troponin 07/26/2020   Recurrent falls 08/16/2019   Sinus arrest 08/15/2019   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   Cervicalgia of occipito-atlanto-axial region 06/26/2019   Head injury 06/26/2019   Frequent falls 06/26/2019    Orientation RESPIRATION BLADDER Height & Weight     Self, Time, Situation, Place  Normal Continent Weight: 194 lb 3.6 oz (88.1 kg) Height:  5' (152.4 cm)  BEHAVIORAL SYMPTOMS/MOOD NEUROLOGICAL BOWEL NUTRITION STATUS      Continent Diet (Regular)  AMBULATORY STATUS COMMUNICATION OF NEEDS Skin   Limited Assist (Min assist per PT)   Other (Comment) (MASD anterior  right/left abdomen; Stage 2 pressre injury to medial sacrum; Abrasion left/right face and knee; Ecchymosis arm, leg and face)                       Personal Care Assistance Level of Assistance  Bathing, Feeding, Dressing Bathing Assistance: Limited assistance (Upper body min assist and lower body mod assist) Feeding assistance: Independent Dressing Assistance: Limited assistance (Upper body min assist and lower body mod assist)     Functional Limitations Info  Sight, Hearing, Speech Sight Info: Impaired (Wears glasses) Hearing Info: Adequate Speech Info: Adequate    SPECIAL CARE FACTORS FREQUENCY  PT (By licensed PT), OT (By licensed OT)     PT Frequency: PT evaluation 11/4. PT at SNF eval and treat, a minimum of 5 days per week OT Frequency: OT evaluation 11/5. OT at SNF eval and treat, a minimum of 5 days per week            Contractures Contractures Info: Not present    Additional Factors Info  Code Status, Allergies, Insulin Sliding Scale Code Status Info: Full Allergies Info: Pentosan Polysulfate Sodium, Elmiron, Naloxone   Insulin Sliding Scale Info: 0-6 Units 3 times per day with meals       Current Medications (12/09/2020):  This is the current hospital active medication list Current Facility-Administered Medications  Medication Dose Route Frequency Provider Last Rate Last Admin   acetaminophen (TYLENOL) tablet 650 mg  650 mg Oral Q6H PRN Opyd, Lavone Neri, MD   650 mg at  12/07/20 2157   Or   acetaminophen (TYLENOL) suppository 650 mg  650 mg Rectal Q6H PRN Opyd, Lavone Neri, MD       calcium carbonate (TUMS - dosed in mg elemental calcium) chewable tablet 800 mg of elemental calcium  800 mg of elemental calcium Oral BID Rhetta Mura, MD   800 mg of elemental calcium at 12/08/20 2117   Chlorhexidine Gluconate Cloth 2 % PADS 6 each  6 each Topical Daily Rhetta Mura, MD   6 each at 12/08/20 1133   gabapentin (NEURONTIN) capsule 100 mg  100 mg Oral  QHS Opyd, Lavone Neri, MD   100 mg at 12/08/20 2117   heparin injection 5,000 Units  5,000 Units Subcutaneous Q8H Opyd, Lavone Neri, MD   5,000 Units at 12/09/20 0525   HYDROmorphone (DILAUDID) injection 0.5 mg  0.5 mg Intravenous Q3H PRN Opyd, Lavone Neri, MD   0.5 mg at 12/06/20 2122   insulin aspart (novoLOG) injection 0-6 Units  0-6 Units Subcutaneous TID WC Opyd, Lavone Neri, MD   1 Units at 12/09/20 4163   levothyroxine (SYNTHROID) tablet 150 mcg  150 mcg Oral Daily Briscoe Deutscher, MD   150 mcg at 12/08/20 8453   loperamide (IMODIUM) capsule 2 mg  2 mg Oral Q4H Rhetta Mura, MD   2 mg at 12/09/20 6468   oxyCODONE (Oxy IR/ROXICODONE) immediate release tablet 5 mg  5 mg Oral Q4H PRN Opyd, Lavone Neri, MD   5 mg at 12/08/20 2356   sodium chloride flush (NS) 0.9 % injection 3 mL  3 mL Intravenous Q12H Opyd, Lavone Neri, MD   3 mL at 12/08/20 1134     Discharge Medications: Please see discharge summary for a list of discharge medications.  Relevant Imaging Results:  Relevant Lab Results:   Additional Information ss#[id number]. Patient has had 2 COVID vaccinations  Okey Dupre, Lazaro Arms, LCSW

## 2020-12-09 NOTE — Progress Notes (Signed)
PT Cancellation Note  Patient Details Name: Jillian Leblanc MRN: [id number] DOB: Mar 08, 1945   Cancelled Treatment:    Reason Eval/Treat Not Completed: Other (comment) Pt pleasantly declining therapy session; reports she recently got back into bed. Plan for d/c to SNF today.  [name], PT, DPT Acute Rehabilitation Services Pager [phone] Office [phone]    [name] 12/09/2020, 2:03 PM

## 2020-12-18 ENCOUNTER — Emergency Department (HOSPITAL_COMMUNITY): Payer: Medicare Other

## 2020-12-18 ENCOUNTER — Inpatient Hospital Stay (HOSPITAL_COMMUNITY)
Admission: EM | Admit: 2020-12-18 | Discharge: 2021-01-08 | DRG: 853 | Disposition: A | Discharge status: Skilled Nursing Facility | Payer: Medicare Other | Attending: Internal Medicine | Admitting: Internal Medicine

## 2020-12-18 DIAGNOSIS — J9601 Acute respiratory failure with hypoxia: Secondary | ICD-10-CM | POA: Diagnosis present

## 2020-12-18 DIAGNOSIS — R062 Wheezing: Secondary | ICD-10-CM

## 2020-12-18 DIAGNOSIS — N1831 Chronic kidney disease, stage 3a: Secondary | ICD-10-CM | POA: Diagnosis present

## 2020-12-18 DIAGNOSIS — Z20822 Contact with and (suspected) exposure to covid-19: Secondary | ICD-10-CM | POA: Diagnosis present

## 2020-12-18 DIAGNOSIS — E861 Hypovolemia: Secondary | ICD-10-CM | POA: Diagnosis not present

## 2020-12-18 DIAGNOSIS — G928 Other toxic encephalopathy: Secondary | ICD-10-CM | POA: Diagnosis present

## 2020-12-18 DIAGNOSIS — M8468XA Pathological fracture in other disease, other site, initial encounter for fracture: Secondary | ICD-10-CM | POA: Diagnosis present

## 2020-12-18 DIAGNOSIS — N182 Chronic kidney disease, stage 2 (mild): Secondary | ICD-10-CM | POA: Diagnosis not present

## 2020-12-18 DIAGNOSIS — D696 Thrombocytopenia, unspecified: Secondary | ICD-10-CM | POA: Diagnosis not present

## 2020-12-18 DIAGNOSIS — D61818 Other pancytopenia: Secondary | ICD-10-CM | POA: Diagnosis not present

## 2020-12-18 DIAGNOSIS — K219 Gastro-esophageal reflux disease without esophagitis: Secondary | ICD-10-CM | POA: Diagnosis present

## 2020-12-18 DIAGNOSIS — E222 Syndrome of inappropriate secretion of antidiuretic hormone: Secondary | ICD-10-CM | POA: Diagnosis present

## 2020-12-18 DIAGNOSIS — Z7984 Long term (current) use of oral hypoglycemic drugs: Secondary | ICD-10-CM

## 2020-12-18 DIAGNOSIS — E890 Postprocedural hypothyroidism: Secondary | ICD-10-CM | POA: Diagnosis present

## 2020-12-18 DIAGNOSIS — E039 Hypothyroidism, unspecified: Secondary | ICD-10-CM

## 2020-12-18 DIAGNOSIS — Z96611 Presence of right artificial shoulder joint: Secondary | ICD-10-CM | POA: Diagnosis present

## 2020-12-18 DIAGNOSIS — I480 Paroxysmal atrial fibrillation: Secondary | ICD-10-CM | POA: Diagnosis present

## 2020-12-18 DIAGNOSIS — Z79890 Hormone replacement therapy (postmenopausal): Secondary | ICD-10-CM

## 2020-12-18 DIAGNOSIS — D65 Disseminated intravascular coagulation [defibrination syndrome]: Secondary | ICD-10-CM | POA: Diagnosis present

## 2020-12-18 DIAGNOSIS — F05 Delirium due to known physiological condition: Secondary | ICD-10-CM | POA: Diagnosis not present

## 2020-12-18 DIAGNOSIS — M25551 Pain in right hip: Secondary | ICD-10-CM | POA: Diagnosis present

## 2020-12-18 DIAGNOSIS — E871 Hypo-osmolality and hyponatremia: Secondary | ICD-10-CM | POA: Diagnosis not present

## 2020-12-18 DIAGNOSIS — R6521 Severe sepsis with septic shock: Secondary | ICD-10-CM | POA: Diagnosis present

## 2020-12-18 DIAGNOSIS — E1169 Type 2 diabetes mellitus with other specified complication: Secondary | ICD-10-CM | POA: Diagnosis present

## 2020-12-18 DIAGNOSIS — R609 Edema, unspecified: Secondary | ICD-10-CM | POA: Diagnosis not present

## 2020-12-18 DIAGNOSIS — D649 Anemia, unspecified: Secondary | ICD-10-CM | POA: Diagnosis not present

## 2020-12-18 DIAGNOSIS — M25522 Pain in left elbow: Secondary | ICD-10-CM | POA: Diagnosis present

## 2020-12-18 DIAGNOSIS — D638 Anemia in other chronic diseases classified elsewhere: Secondary | ICD-10-CM | POA: Diagnosis present

## 2020-12-18 DIAGNOSIS — E872 Acidosis, unspecified: Secondary | ICD-10-CM | POA: Diagnosis present

## 2020-12-18 DIAGNOSIS — L89152 Pressure ulcer of sacral region, stage 2: Secondary | ICD-10-CM | POA: Diagnosis present

## 2020-12-18 DIAGNOSIS — T827XXA Infection and inflammatory reaction due to other cardiac and vascular devices, implants and grafts, initial encounter: Secondary | ICD-10-CM | POA: Diagnosis not present

## 2020-12-18 DIAGNOSIS — M4626 Osteomyelitis of vertebra, lumbar region: Secondary | ICD-10-CM | POA: Diagnosis present

## 2020-12-18 DIAGNOSIS — N179 Acute kidney failure, unspecified: Secondary | ICD-10-CM | POA: Diagnosis not present

## 2020-12-18 DIAGNOSIS — B9620 Unspecified Escherichia coli [E. coli] as the cause of diseases classified elsewhere: Secondary | ICD-10-CM | POA: Diagnosis not present

## 2020-12-18 DIAGNOSIS — E875 Hyperkalemia: Secondary | ICD-10-CM | POA: Diagnosis not present

## 2020-12-18 DIAGNOSIS — R7881 Bacteremia: Secondary | ICD-10-CM

## 2020-12-18 DIAGNOSIS — G9341 Metabolic encephalopathy: Secondary | ICD-10-CM | POA: Diagnosis not present

## 2020-12-18 DIAGNOSIS — T40605A Adverse effect of unspecified narcotics, initial encounter: Secondary | ICD-10-CM | POA: Diagnosis not present

## 2020-12-18 DIAGNOSIS — M4646 Discitis, unspecified, lumbar region: Secondary | ICD-10-CM

## 2020-12-18 DIAGNOSIS — E1142 Type 2 diabetes mellitus with diabetic polyneuropathy: Secondary | ICD-10-CM | POA: Diagnosis present

## 2020-12-18 DIAGNOSIS — R52 Pain, unspecified: Secondary | ICD-10-CM

## 2020-12-18 DIAGNOSIS — J45901 Unspecified asthma with (acute) exacerbation: Secondary | ICD-10-CM | POA: Diagnosis not present

## 2020-12-18 DIAGNOSIS — Z95810 Presence of automatic (implantable) cardiac defibrillator: Secondary | ICD-10-CM

## 2020-12-18 DIAGNOSIS — E1165 Type 2 diabetes mellitus with hyperglycemia: Secondary | ICD-10-CM | POA: Diagnosis present

## 2020-12-18 DIAGNOSIS — G834 Cauda equina syndrome: Secondary | ICD-10-CM | POA: Diagnosis not present

## 2020-12-18 DIAGNOSIS — Z7982 Long term (current) use of aspirin: Secondary | ICD-10-CM

## 2020-12-18 DIAGNOSIS — E1122 Type 2 diabetes mellitus with diabetic chronic kidney disease: Secondary | ICD-10-CM | POA: Diagnosis present

## 2020-12-18 DIAGNOSIS — I5032 Chronic diastolic (congestive) heart failure: Secondary | ICD-10-CM

## 2020-12-18 DIAGNOSIS — A4151 Sepsis due to Escherichia coli [E. coli]: Secondary | ICD-10-CM | POA: Diagnosis present

## 2020-12-18 DIAGNOSIS — E11649 Type 2 diabetes mellitus with hypoglycemia without coma: Secondary | ICD-10-CM | POA: Diagnosis not present

## 2020-12-18 DIAGNOSIS — R6520 Severe sepsis without septic shock: Secondary | ICD-10-CM | POA: Diagnosis not present

## 2020-12-18 DIAGNOSIS — A419 Sepsis, unspecified organism: Secondary | ICD-10-CM

## 2020-12-18 DIAGNOSIS — E8809 Other disorders of plasma-protein metabolism, not elsewhere classified: Secondary | ICD-10-CM | POA: Diagnosis present

## 2020-12-18 DIAGNOSIS — Z6841 Body Mass Index (BMI) 40.0 and over, adult: Secondary | ICD-10-CM

## 2020-12-18 DIAGNOSIS — I4891 Unspecified atrial fibrillation: Secondary | ICD-10-CM | POA: Diagnosis not present

## 2020-12-18 DIAGNOSIS — I959 Hypotension, unspecified: Secondary | ICD-10-CM | POA: Diagnosis not present

## 2020-12-18 DIAGNOSIS — R1310 Dysphagia, unspecified: Secondary | ICD-10-CM | POA: Diagnosis not present

## 2020-12-18 DIAGNOSIS — R296 Repeated falls: Secondary | ICD-10-CM | POA: Diagnosis present

## 2020-12-18 DIAGNOSIS — G061 Intraspinal abscess and granuloma: Secondary | ICD-10-CM | POA: Diagnosis present

## 2020-12-18 DIAGNOSIS — R0602 Shortness of breath: Secondary | ICD-10-CM

## 2020-12-18 DIAGNOSIS — E876 Hypokalemia: Secondary | ICD-10-CM | POA: Diagnosis not present

## 2020-12-18 DIAGNOSIS — M4620 Osteomyelitis of vertebra, site unspecified: Secondary | ICD-10-CM | POA: Diagnosis not present

## 2020-12-18 DIAGNOSIS — Z79899 Other long term (current) drug therapy: Secondary | ICD-10-CM

## 2020-12-18 DIAGNOSIS — M869 Osteomyelitis, unspecified: Secondary | ICD-10-CM

## 2020-12-18 DIAGNOSIS — D62 Acute posthemorrhagic anemia: Secondary | ICD-10-CM | POA: Diagnosis not present

## 2020-12-18 DIAGNOSIS — Z789 Other specified health status: Secondary | ICD-10-CM

## 2020-12-18 DIAGNOSIS — Z419 Encounter for procedure for purposes other than remedying health state, unspecified: Secondary | ICD-10-CM

## 2020-12-18 HISTORY — DX: Heart failure, unspecified: I50.9

## 2020-12-18 HISTORY — DX: Presence of cardiac pacemaker: Z95.0

## 2020-12-18 HISTORY — DX: Hypothyroidism, unspecified: E03.9

## 2020-12-18 HISTORY — DX: Other chronic pain: G89.29

## 2020-12-18 HISTORY — DX: Cardiac arrhythmia, unspecified: I49.9

## 2020-12-18 LAB — COMPREHENSIVE METABOLIC PANEL
ALT: 29 U/L (ref 0–44)
AST: 43 U/L — ABNORMAL HIGH (ref 15–41)
Albumin: 2.3 g/dL — ABNORMAL LOW (ref 3.5–5.0)
Alkaline Phosphatase: 217 U/L — ABNORMAL HIGH (ref 38–126)
Anion gap: 13 (ref 5–15)
BUN: 37 mg/dL — ABNORMAL HIGH (ref 8–23)
CO2: 19 mmol/L — ABNORMAL LOW (ref 22–32)
Calcium: 7 mg/dL — ABNORMAL LOW (ref 8.9–10.3)
Chloride: 94 mmol/L — ABNORMAL LOW (ref 98–111)
Creatinine, Ser: 2.2 mg/dL — ABNORMAL HIGH (ref 0.44–1.00)
GFR, Estimated: 23 mL/min — ABNORMAL LOW (ref >60)
Glucose, Bld: 129 mg/dL — ABNORMAL HIGH (ref 70–99)
Potassium: 4.2 mmol/L (ref 3.5–5.1)
Sodium: 126 mmol/L — ABNORMAL LOW (ref 135–145)
Total Bilirubin: 1.6 mg/dL — ABNORMAL HIGH (ref 0.3–1.2)
Total Protein: 6.1 g/dL — ABNORMAL LOW (ref 6.5–8.1)

## 2020-12-18 LAB — RESP PANEL BY RT-PCR (FLU A&B, COVID) ARPGX2
Influenza A by PCR: NEGATIVE
Influenza B by PCR: NEGATIVE
SARS Coronavirus 2 by RT PCR: NEGATIVE

## 2020-12-18 LAB — LACTIC ACID, PLASMA
Lactic Acid, Venous: 2.1 mmol/L (ref 0.5–1.9)
Lactic Acid, Venous: 1.8 mmol/L (ref 0.5–1.9)

## 2020-12-18 LAB — CBC WITH DIFFERENTIAL/PLATELET
Abs Immature Granulocytes: 0.04 10*3/uL (ref 0.00–0.07)
Basophils Absolute: 0 10*3/uL (ref 0.0–0.1)
Basophils Relative: 0 %
Eosinophils Absolute: 0 10*3/uL (ref 0.0–0.5)
Eosinophils Relative: 0 %
HCT: 27.3 % — ABNORMAL LOW (ref 36.0–46.0)
Hemoglobin: 8.9 g/dL — ABNORMAL LOW (ref 12.0–15.0)
Immature Granulocytes: 1 %
Lymphocytes Relative: 6 %
Lymphs Abs: 0.3 10*3/uL — ABNORMAL LOW (ref 0.7–4.0)
MCH: 28.9 pg (ref 26.0–34.0)
MCHC: 32.6 g/dL (ref 30.0–36.0)
MCV: 88.6 fL (ref 80.0–100.0)
Monocytes Absolute: 0.2 10*3/uL (ref 0.1–1.0)
Monocytes Relative: 4 %
Neutro Abs: 4.8 10*3/uL (ref 1.7–7.7)
Neutrophils Relative %: 89 %
Platelets: 69 10*3/uL — ABNORMAL LOW (ref 150–400)
RBC: 3.08 MIL/uL — ABNORMAL LOW (ref 3.87–5.11)
RDW: 15.5 % (ref 11.5–15.5)
WBC: 5.4 10*3/uL (ref 4.0–10.5)
nRBC: 0 % (ref 0.0–0.2)

## 2020-12-18 LAB — APTT: aPTT: 32 seconds (ref 24–36)

## 2020-12-18 LAB — PROTIME-INR
INR: 1.2 (ref 0.8–1.2)
Prothrombin Time: 14.9 seconds (ref 11.4–15.2)

## 2020-12-18 MED ORDER — LACTATED RINGERS IV BOLUS (SEPSIS)
1000.0000 mL | Freq: Once | INTRAVENOUS | Status: AC
  Administered 2020-12-18: 1000 mL via INTRAVENOUS

## 2020-12-18 MED ORDER — FENTANYL CITRATE PF 50 MCG/ML IJ SOSY
50.0000 ug | PREFILLED_SYRINGE | Freq: Once | INTRAMUSCULAR | Status: AC
  Administered 2020-12-18: 50 ug via INTRAVENOUS
  Filled 2020-12-18: qty 1

## 2020-12-18 MED ORDER — LACTATED RINGERS IV SOLN: INTRAVENOUS | Status: AC

## 2020-12-18 MED ORDER — ACETAMINOPHEN 325 MG PO TABS
650.0000 mg | ORAL_TABLET | Freq: Once | ORAL | Status: DC
  Filled 2020-12-18: qty 2

## 2020-12-18 MED ORDER — IPRATROPIUM-ALBUTEROL 0.5-2.5 (3) MG/3ML IN SOLN
3.0000 mL | Freq: Once | RESPIRATORY_TRACT | Status: AC
  Administered 2020-12-18: 3 mL via RESPIRATORY_TRACT
  Filled 2020-12-18: qty 3

## 2020-12-18 MED ORDER — VANCOMYCIN HCL 1750 MG/350ML IV SOLN
1750.0000 mg | Freq: Once | INTRAVENOUS | Status: AC
  Administered 2020-12-18: 1750 mg via INTRAVENOUS
  Filled 2020-12-18: qty 350

## 2020-12-18 MED ORDER — METRONIDAZOLE 500 MG/100ML IV SOLN
500.0000 mg | Freq: Once | INTRAVENOUS | Status: AC
  Administered 2020-12-18: 500 mg via INTRAVENOUS
  Filled 2020-12-18: qty 100

## 2020-12-18 MED ORDER — SODIUM CHLORIDE 0.9 % IV SOLN
2.0000 g | Freq: Once | INTRAVENOUS | Status: AC
  Administered 2020-12-18: 2 g via INTRAVENOUS
  Filled 2020-12-18: qty 2

## 2020-12-18 MED ORDER — ACETAMINOPHEN 325 MG PO TABS
650.0000 mg | ORAL_TABLET | Freq: Four times a day (QID) | ORAL | Status: DC | PRN
  Administered 2020-12-19 – 2021-01-02 (×8): 650 mg via ORAL
  Filled 2020-12-18 (×11): qty 2

## 2020-12-18 MED ORDER — ACETAMINOPHEN 650 MG RE SUPP
650.0000 mg | Freq: Four times a day (QID) | RECTAL | Status: DC | PRN
  Administered 2020-12-19: 650 mg via RECTAL
  Filled 2020-12-18: qty 1

## 2020-12-18 NOTE — Progress Notes (Signed)
Sepsis tracking by eLINK 

## 2020-12-18 NOTE — ED Triage Notes (Signed)
Pt arrived via [facility] from home. Per EMS, pt had pneumonia 1 month ago and was d/c from this facility one week ago. Pt c/o increased SOB, fatigue, muscle aches, and back pain over the past 24 hours but that it hasn't completely improved since she was discharged. Pt caox4. EMS reports possibly febrile. Pt arrived to ED caox4 stating primary complaint is back pain which is chronic.

## 2020-12-18 NOTE — H&P (Signed)
History and Physical    PLEASE NOTE THAT DRAGON DICTATION SOFTWARE WAS USED IN THE CONSTRUCTION OF THIS NOTE.   Jillian Leblanc BSJ:[id number] DOB: 13-Nov-1945 DOA: 12/18/2020  PCP: Guadlupe Spanish, MD  Patient coming from: home   I have personally briefly reviewed patient's old medical records in [place]  Chief Complaint: Cough  HPI: Jillian Leblanc is a 75 y.o. female with medical history significant for chronic back pain, type 2 diabetes mellitus complicated by peripheral polyneuropathy, acquired hypothyroidism, chronic diastolic heart failure, chronic anemia with baseline hemoglobin 9-11.5, who is admitted to [facility] on 12/18/2020 with severe sepsis after presenting from home to [facility] ED complaining of cough.   In the setting of the patient's altered mental status, the following history is provided by the patient's daughter, who is present at bedside, in addition to my discussions with the EDP and via chart review.  The patient was recently hospitalized at [facility] after a fall, and was admitted at the time for further evaluation management of acute kidney injury.  Relative to her baseline creatinine approximately 1.0, she had presented to [facility] emergency department on 12/04/2020 with creatinine of 3.71, with ensuing renal function improving with interval IV fluids, with final serum creatinine prior to discharge from the hospital on 12/07/2020 noted to be 1.43.  Additionally, initial serum sodium on day of admission was noted to be 1.1 on 12/05/2020, which slightly improved to 129 02/07/2020 following interval IV fluids . she was evaluated by physical therapy during this process position, recommended SNF placement for temporary rehab, although the patient and her family ultimately elected to be discharged to home.   Over the last 2 to 3 days, patient has exhibited progressive nonproductive cough, which is new for her, before developing an objective fever over the  course of the last day, temperature max at home noted to be 101.  Daughter reports that the patient is also exhibited interval decline in oral intake as well as some increasing confusion and increased somnolence over the last 1 to 2 days.  Medical history notable for chronic diastolic heart failure, with most recent echocardiogram performed on 11/24/2020 demonstrating LVEF 60 to 65%, no focal wall motion normalities, grade 1 diastolic dysfunction, normal right ventricular systolic function, and no evidence of significant valvular pathology.  She also has a history of chronic anemia with baseline hemoglobin 9-11.5.  She is on a daily baby aspirin at home, but otherwise on no additional blood thinners.  No known chronic underlying pulmonary pathology, and daughter reports that patient is lifelong non-smoker.   She has a history of chronic back pain for which she has been following with a pain specialist as an outpatient.  She was evaluated by this provider in the last week, with ensuing recommendation for steroid injection.  However, the patient developed the presenting cough and subsequently had objective fever prior to being able to attend her scheduled steroid injection.     ED Course:  Vital signs in the ED were notable for the following: Temperature max 101.3; initial heart rate 108, which decreased to 98 following interval IV fluids; initial blood pressure 96/54, which increased to 109/47 following interval IV fluids; respiratory rate 16-26, oxygen saturation 96 to 100% on room air.  Labs were notable for the following: CMP notable for the following: Sodium 126 , BUN 37, creatinine 2.20.  Calcium, corrected for albumin: 8.4, albumin 2.3.  CBC notable for white blood cell count 5400, hemoglobin 8.9 compared to  11.5 on 12/07/2020 and associated normocytic/remarkable findings as well as elevated RDW, platelets 69, compared to 331 on 12/07/2020.  Initial lactic acid 2.1, with repeat trending down to 1.8.   INR 1.2.  Urinalysis has been ordered, with result currently pending.  COVID-19/influenza PCR checked in the ED today found to be negative.  Blood cultures x2 were collected prior to initiation of IV antibiotics.  Imaging and additional notable ED work-up: EKG shows sinus rhythm with heart rate 91, normal intervals, no evidence of T wave or ST changes, including no evidence of ST elevation.  Chest x-ray suggestive of mild pulmonary vascular congestion, but otherwise showed no evidence of acute cardiopulmonary process, including no evidence of interstitial or alveolar edema, no evidence of infiltrate, pleural effusion, or pneumothorax.  While in the ED, the following were administered: Fentanyl 50 mcg IV x2 doses, duo nebulizer treatment x1, cefepime, IV vancomycin, Flagyl, 1 L LR bolus followed by transition to continuous LR at 150 cc/h.  Subsequently, the patient was admitted to the PCU for further evaluation management severe sepsis with acute kidney injury, and associated acute metabolic encephalopathy.     Review of Systems: As per HPI otherwise 10 point review of systems negative.   Past Medical History:  Diagnosis Date   Diabetes mellitus without complication ([place])    Interstitial cystitis    Thyroid disease     Past Surgical History:  Procedure Laterality Date   ABDOMINAL HYSTERECTOMY     CHOLECYSTECTOMY     NECK SURGERY     PACEMAKER IMPLANT N/A 08/16/2019   Procedure: PACEMAKER IMPLANT;  Surgeon: Constance Haw, MD;  Location: [facility];  Service: Cardiovascular;  Laterality: N/A;   REVERSE SHOULDER ARTHROPLASTY Right 07/28/2020   Procedure: REVERSE SHOULDER ARTHROPLASTY;  Surgeon: Justice Britain, MD;  Location: [facility];  Service: Orthopedics;  Laterality: Right;   SHOULDER SURGERY     THYROIDECTOMY      Social History:  reports that she has never smoked. She has never used smokeless tobacco. She reports that she does not drink alcohol and does not use  drugs.   Allergies  Allergen Reactions   Pentosan Polysulfate Sodium     Other reaction(s): Other (See Comments) ELMIRON Y Drug hair fell out 10/21/2009 12:00:00 AM by [name] CNA 1   Elmiron [Pentosan Polysulfate]    Naloxone Other (See Comments)    Hallucinations Confusion Nightmares   Family history reviewed and not pertinent    Prior to Admission medications   Medication Sig Start Date End Date Taking? Authorizing Provider  amitriptyline (ELAVIL) 100 MG tablet Take 100 mg by mouth at bedtime.   Yes [provider]  aspirin EC 81 MG tablet Take 81 mg by mouth daily. Swallow whole.   Yes [provider]  calcium carbonate (TUMS - DOSED IN MG ELEMENTAL 8.4.  Albumin 2.3) 500 MG chewable tablet Chew 4 tablets (800 mg of elemental calcium total) by mouth 2 (two) times daily. 12/08/20  Yes Nita Sells, MD  calcium-vitamin D 250-100 MG-UNIT tablet Take 1 tablet by mouth 2 (two) times daily. 09/14/20  Yes Palumbo, April, MD  dapagliflozin propanediol (FARXIGA) 10 MG TABS tablet Take 10 mg by mouth daily. 12/12/20  Yes [provider]  furosemide (LASIX) 40 MG tablet Take 40 mg by mouth 2 (two) times daily.   Yes [provider]  gabapentin (NEURONTIN) 300 MG capsule Take 1 capsule (300 mg total) by mouth 2 (two) times daily. 12/08/20  Yes  Nita Sells, MD  glipiZIDE (GLUCOTROL XL) 2.5 MG 24 hr tablet Take 1 tablet (2.5 mg total) by mouth daily with breakfast. 12/08/20  Yes Nita Sells, MD  hydrOXYzine (ATARAX/VISTARIL) 25 MG tablet Take 1 tablet (25 mg total) by mouth at bedtime as needed for anxiety. 12/08/20  Yes Nita Sells, MD  levothyroxine (SYNTHROID) 150 MCG tablet Take 150 mcg by mouth daily. 09/01/20  Yes [provider]  methocarbamol (ROBAXIN) 500 MG tablet Take 500 mg by mouth every 8 (eight) hours as needed for muscle spasms. 11/27/20  Yes [provider]  oxybutynin (DITROPAN-XL) 10 MG  24 hr tablet Take 10 mg by mouth at bedtime.   Yes [provider]  pramipexole (MIRAPEX) 1.5 MG tablet Take 1.5 mg by mouth 3 (three) times daily. 06/29/20  Yes [provider]  loperamide (IMODIUM) 2 MG capsule Take 1 capsule (2 mg total) by mouth every 4 (four) hours. Patient not taking: Reported on 12/18/2020 12/08/20   Nita Sells, MD  ondansetron (ZOFRAN-ODT) 4 MG disintegrating tablet Take 4 mg by mouth every 8 (eight) hours as needed for nausea or vomiting. Patient not taking: Reported on 12/18/2020 09/02/20   [provider]     Objective    Physical Exam: Vitals:   12/18/20 2215 12/18/20 2230 12/18/20 2237 12/18/20 2248  BP: (!) 101/52 (!) 126/92    Pulse: (!) 104 (!) 106    Resp: 20 (!) 22    Temp:   (!) 101.3 F (38.5 C)   TempSrc:   Rectal   SpO2: 96% 92%  99%    General: appears to be stated age; somnolent, patient will briefly open eyes questions posed to her, will for falling back asleep; unable to follow instructions at this time; however, she is noted to spontaneously move all 4 extremities. Skin: warm, dry, no rash Head:  AT/[place] Mouth:  Oral mucosa membranes appear dry, normal dentition Neck: supple; trachea midline Heart:  RRR; did not appreciate any M/R/G Lungs: CTAB, did not appreciate any wheezes, rales, or rhonchi Abdomen: + BS; soft, ND Vascular: 2+ pedal pulses b/l; 2+ radial pulses b/l Extremities: no peripheral edema, no muscle wasting Neuro: In the setting of the patient's current mental status and associated inability to follow instructions, unable to perform full neurologic exam at this time.  As such, assessment of strength, sensation, and cranial nerves is limited at this time. Patient noted to spontaneously move all 4 extremities. No tremors.     Labs on Admission: I have personally reviewed following labs and imaging studies  CBC: Recent Labs  Lab 12/18/20 1949  WBC 5.4  NEUTROABS 4.8  HGB 8.9*  HCT  27.3*  MCV 88.6  PLT 69*   Basic Metabolic Panel: Recent Labs  Lab 12/18/20 1949  NA 126*  K 4.2  CL 94*  CO2 19*  GLUCOSE 129*  BUN 37*  CREATININE 2.20*  CALCIUM 7.0*   GFR: Estimated Creatinine Clearance: 22.5 mL/min (A) (by C-G formula based on SCr of 2.2 mg/dL (H)). Liver Function Tests: Recent Labs  Lab 12/18/20 1949  AST 43*  ALT 29  ALKPHOS 217*  BILITOT 1.6*  PROT 6.1*  ALBUMIN 2.3*   No results for input(s): LIPASE, AMYLASE in the last 168 hours. No results for input(s): AMMONIA in the last 168 hours. Coagulation Profile: Recent Labs  Lab 12/18/20 1949  INR 1.2   Cardiac Enzymes: No results for input(s): CKTOTAL, CKMB, CKMBINDEX, TROPONINI in the last 168 hours. BNP (last  3 results) No results for input(s): PROBNP in the last 8760 hours. HbA1C: No results for input(s): HGBA1C in the last 72 hours. CBG: No results for input(s): GLUCAP in the last 168 hours. Lipid Profile: No results for input(s): CHOL, HDL, LDLCALC, TRIG, CHOLHDL, LDLDIRECT in the last 72 hours. Thyroid Function Tests: No results for input(s): TSH, T4TOTAL, FREET4, T3FREE, THYROIDAB in the last 72 hours. Anemia Panel: No results for input(s): VITAMINB12, FOLATE, FERRITIN, TIBC, IRON, RETICCTPCT in the last 72 hours. Urine analysis:    Component Value Date/Time   COLORURINE YELLOW 12/04/2020 [place] 12/04/2020 1734   LABSPEC 1.006 12/04/2020 1734   PHURINE 5.0 12/04/2020 1734   GLUCOSEU NEGATIVE 12/04/2020 1734   HGBUR SMALL (A) 12/04/2020 1734   BILIRUBINUR NEGATIVE 12/04/2020 1734   KETONESUR NEGATIVE 12/04/2020 1734   PROTEINUR NEGATIVE 12/04/2020 1734   UROBILINOGEN 0.2 09/15/2012 1536   NITRITE NEGATIVE 12/04/2020 1734   LEUKOCYTESUR MODERATE (A) 12/04/2020 1734    Radiological Exams on Admission: DG Chest Port 1 View  Result Date: 12/18/2020 CLINICAL DATA:  Initial evaluation for questionable sepsis. EXAM: PORTABLE CHEST 1 VIEW COMPARISON:  Prior  radiograph from 12/04/2020. FINDINGS: Dual lead left-sided pacemaker/AICD in place. Mild cardiomegaly, stable. Mediastinal silhouette within normal limits. Aortic atherosclerosis. Lungs are hypoinflated. Mild diffuse pulmonary vascular congestion. No pleural effusion. No consolidative airspace disease or focal infiltrate. No pneumothorax. Right shoulder arthroplasty noted.  No acute osseous finding. IMPRESSION: 1. Cardiomegaly with mild diffuse pulmonary vascular congestion. 2. No other active cardiopulmonary disease. 3.  Aortic Atherosclerosis (ICD10-I70.0). Electronically Signed   By: Jeannine Boga M.D.   On: 12/18/2020 20:38     EKG: Independently reviewed, with result as described above.    Assessment/Plan   Principal Problem:   Severe sepsis (HCC) Active Problems:   AKI (acute kidney injury) ([place])   Type 2 diabetes mellitus with hypoglycemia without coma (HCC)   Hypothyroidism   Acute metabolic encephalopathy   Acute hyponatremia   Thrombocytopenia (HCC)   Chronic anemia   Chronic diastolic CHF (congestive heart failure) ([place])     #) Severe sepsis: In the setting of presenting new onset cough with objective fever, SIRS criteria met by aforementioned objective fever as well as tachypnea.  Of note, given the associated presence of suspected end organ damage in the form of concominant presenting acute kidney injury, acute encephalopathy, and elevated lactic acid of 2.1, criteria are met for pt's sepsis to be considered severe in nature. However, in the absence of lactic acid level that is greater than or equal to 4.0, and in the absence of any associated hypotension refractory to IVF's, there are no indications for administration of a 30 mL/kg IVF bolus at this time.  While underlying infection is suspected, specific source is not yet been identified.  COVID-19/influenza PCR found to be negative.  Differential at this time includes, given presenting onset cough.  Blood present chest  x-ray demonstrates no evidence of infiltrate, she is clinically dehydrated, r increasing risk for a radiographic false negative given this volume status.  We will further evaluate by checking procalcitonin, a repeat chest x-ray following interval IV resuscitative efforts.  In the interval, we will continue broad-spectrum IV antibiotics in the form of IV vancomycin and cefepime.  Of note, urinalysis is also been ordered, with result currently pending.  He has a history of cholecystectomy, and no evidence of acute transaminitis at this time.  Blood cultures x2 collected in ED prior to initiation of  IV antibiotics.  Repeat lactate trending down to 1.8.   Plan: CBC w/ diff in AM.  Continuous LR, close monitoring of ensuing volume status.  Follow-up results x2.  IV vancomycin and cefepime, as above.  Repeat chest x-ray in the morning, as further detailed above.  Follow-up results of urinalysis.  Add on procalcitonin.  I will spirometry, flutter valve, strep urine antigen ordered.  Check MRSA PCR.  CMP in the morning.      #) Acute kidney injury: Presenting serum creatinine 2.20 relative to baseline 1.0.  This is in the context of prior acute kidney injury identified at time of admission from most recent provider hospitalization, at which time creatinine was noted to be 3.7 before improved normal course of the hospitalization, with final serum creatinine data point prior to discharge noted to be 1.43.  Suspect that today's acute kidney injury is prerenal in nature, with clinical suspicion for intravascular depletion given recent decline in oral intake of food and fluid over the last few days as well as additional intravascular depletion as a consequence of increased capillary permeability as a consequence of presenting severe sepsis.  Of note, will hold home gabapentin given that this medication is exclusively renally cleared.  Urinalysis has been ordered, with result currently pending.  Plan: Continuous IV  fluids, as above.  Monitor strict I's and O's and weights.  Tempt avoid nephrotoxic agents.  Hold home gabapentin for now, as above.  Follow-up result urinalysis with microscopy.  Add on reemergence of as well as written urine creatinine.  Further evaluation management presenting severe sepsis, as above.  Repeat CMP in the morning.  Hold home Lasix for now.      #) Acute hypo-osmolar hyponatremia: Presenting serum sodium 126, relative to most recent prior value 129 on 12/07/2020, which improved from 121 first day associated with his recent prior hospitalization, as further detailed above, following interval administration of IV fluids.  Suspect an element of hypovolumeia, with suspected contribution from dehydration in the setting of clinical evidence of such as well as report of recent decline in oral intake coinciding with the timeframe of decline of serum sodium levels.  Differential also includes the possibility of a contribution from SIADH, particularly given suspicion for acute pulmonary infection, significant. in general, will provide gentle IV fluids to attend to suspected contribution from dehydration, while further evaluating for any additional contributing factors, including SIADH, as further detailed below. Will also TSH. No overt pharmacologic contributions. Of note, no evidence of associated acute focal neurologic deficits.    Plan: monitor strict I's and O's and daily weights.  check UA, random urine sodium, urine osmolality.  Check serum osmolality to confirm suspected hypoosmolar etiology.  Repeat BMP in the morning. Check TSH. Gentle IVF's.  Holding home Lasix for now.     #) Acute metabolic encephalopathy: Increased somnolence, confusion over the last 1 to 2 days, which appears to be multifactorial in etiology, with suspected contributions from physiologic stressors stemming from presenting severe sepsis as well as dehydration, acute kidney injury, including associated diminished  renal clearance of gabapentin.  Further evaluation for underlying suspected infectious source as further detailed above, pending procalcitonin as well as urinalysis. [name] unable to follow commands for, performing neuro exam at this time, she is noted to be spontaneously moving all 4 extremities rendering possibility of acute CVA less likely at the present.  Will check blood gas to further evaluate for any contribution from hypercapnic encephalopathy.   Plan: fall precautions. Repeat  CMP/CBC in the AM. Check magnesium level. check VBG, TSH, MMA.  Also consider sepsis, as above.  Holding home central acting medications for now, including gabapentin as well as oxybutynin, with the latter posing increased risk for anticholinergic side effects.  Urinalysis.  Procalcitonin.  Urinary drug screen.  Repeat chest x-ray in the morning.     #) Thrombocytopenia CBC reflects platelet count of 69, relative to most recent prior value 331 on 12/07/2020.  Suspect that this is as a consequence of severe sepsis.  Presenting hemoglobin consistent with baseline hemoglobin range, as further established above.  INR 1.2.  No overt evidence of active bleed at this time.  Is on a daily baby aspirin at home, but otherwise on no blood thinners.  Plan: Further evaluation and treatment presents for sepsis, as above.  Refrain from pharmacologic DVT prophylaxis for now.  SCDs.  Hold home aspirin for now.  Repeat CBC in the morning.       #) Type 2 diabetes mellitus: Not on insulin as an outpatient.  Rather is on dapagliflozin as well as glipizide.  Presenting blood sugar noted to be normoglycemic.  Plan: Hold home oral hypoglycemic agents during this hospitalization.  Accu-Cheks with sliding scale insulin.       #) Chronic diastolic heart failure: Documented history of such, with most recent echocardiogram on 11/24/2020 notable for LVEF 60 to 65% as well as grade 1 diastolic dysfunction, with additional details as  further described above.  Diuretic regimen consists of Lasix 40 mg p.o. twice daily.  Clinically and radiographically, presentation less suggestive of acutely consider failure at this time, particular given report of significant decline in oral intake over the last few days, while chest x-ray shows mild pulmonary vascular congestion in the absence of any evidence of interstitial or pulmonary edema, infiltrate, will effusion.  Will check BNP to further assess.  Presenting EKG shows sinus rhythm without evidence of acute changes.  As the patient appears clinically dehydrated at this time, will continue existing order for IV fluids, decrease rate to 125 cc/h, we will closely monitoring ensuing volume status for evidence of ensuing development of acute volume overload.   Plan: Monitor strict I's and O's and daily weights.  Hold home Lasix for now.  Monitor continuous pulse oximetry.  Add insert magnesium level.  Repeat CMP in the morning.     #) Anemia of chronic disease: Documented history of such, baseline hemoglobin range noted to be 9-11.5, with presenting hemoglobin consistent with this range, and associated with normocytic/normochromic findings as well as nonelevated RDW, no evidence at this time to suggest active bleeding.  INR 1.2.  Plan: Repeat CBC in the morning, including attention to interval platelet count.  SCDs, as further detailed above.      #) Acquired hypothyroidism: Documented history of such, on Synthroid as an outpatient.  Plan: In the setting of presenting acute metabolic encephalopathy, will also check TSH.  For now, continue home dose of Synthroid.       DVT prophylaxis: SCD's   Code Status: Full code Family Communication: Case was discussed with patient's daughter, who is present at bedside Disposition Plan: Per Rounding Team Consults called: none;  Admission status: Inpatient; PCU  Warrants inpatient status on basis of need for further evaluation management of  severe sepsis, including further laboratory and imaging evaluation as well as need for associated IV antibiotics, with evaluation and management of AKI, including IV antibiotics as well as close monitoring of ensuing serial labs to  trend response and renal function as well as associated electrolytes.   PLEASE NOTE THAT DRAGON DICTATION SOFTWARE WAS USED IN THE CONSTRUCTION OF THIS NOTE.   Pearson DO [facility]  From [place]   12/18/2020, 11:13 PM

## 2020-12-18 NOTE — ED Notes (Signed)
Placed on 2L O2 via [place] d/t SpO2 decreasing to the upper 80's when pt falls asleep.

## 2020-12-18 NOTE — ED Provider Notes (Signed)
[facility] EMERGENCY DEPARTMENT Provider Note  CSN: [id number] Arrival date & time: 12/18/20 1923    History No chief complaint on file.   Jillian Leblanc is a 75 y.o. female with history of chronic back pain, DM, interstitial cystitis has had several recent admissions for falls and AKI, she was just discharged 11/16 to SNF but went home instead of going there. She was seen on 11/22 for the first time at [facility] and was scheduled for ESI the following day but began to feel ill with cough, SOB and fever to 103F at home per daughter at bedside. She also reports the patient has been having worsening back pain and has been very confused, hallucinating and not acting her normal self. She is unable to give much additional history. Level 5 caveat applies.    Past Medical History:  Diagnosis Date   Diabetes mellitus without complication ([place])    Interstitial cystitis    Thyroid disease     Past Surgical History:  Procedure Laterality Date   ABDOMINAL HYSTERECTOMY     CHOLECYSTECTOMY     NECK SURGERY     PACEMAKER IMPLANT N/A 08/16/2019   Procedure: PACEMAKER IMPLANT;  Surgeon: Constance Haw, MD;  Location: [facility];  Service: Cardiovascular;  Laterality: N/A;   REVERSE SHOULDER ARTHROPLASTY Right 07/28/2020   Procedure: REVERSE SHOULDER ARTHROPLASTY;  Surgeon: Justice Britain, MD;  Location: [facility];  Service: Orthopedics;  Laterality: Right;   SHOULDER SURGERY     THYROIDECTOMY      No family history on file.  Social History   Tobacco Use   Smoking status: Never   Smokeless tobacco: Never  Vaping Use   Vaping Use: Never used  Substance Use Topics   Alcohol use: No   Drug use: Never     Home Medications Prior to Admission medications   Medication Sig Start Date End Date Taking? Authorizing Provider  amitriptyline (ELAVIL) 100 MG tablet Take 100 mg by mouth at bedtime.   Yes [provider]  aspirin EC 81 MG tablet Take 81 mg by mouth daily.  Swallow whole.   Yes [provider]  calcium carbonate (TUMS - DOSED IN MG ELEMENTAL CALCIUM) 500 MG chewable tablet Chew 4 tablets (800 mg of elemental calcium total) by mouth 2 (two) times daily. 12/08/20  Yes Nita Sells, MD  calcium-vitamin D 250-100 MG-UNIT tablet Take 1 tablet by mouth 2 (two) times daily. 09/14/20  Yes Palumbo, April, MD  dapagliflozin propanediol (FARXIGA) 10 MG TABS tablet Take 10 mg by mouth daily. 12/12/20  Yes [provider]  furosemide (LASIX) 40 MG tablet Take 40 mg by mouth 2 (two) times daily.   Yes [provider]  gabapentin (NEURONTIN) 300 MG capsule Take 1 capsule (300 mg total) by mouth 2 (two) times daily. 12/08/20  Yes Nita Sells, MD  glipiZIDE (GLUCOTROL XL) 2.5 MG 24 hr tablet Take 1 tablet (2.5 mg total) by mouth daily with breakfast. 12/08/20  Yes Nita Sells, MD  hydrOXYzine (ATARAX/VISTARIL) 25 MG tablet Take 1 tablet (25 mg total) by mouth at bedtime as needed for anxiety. 12/08/20  Yes Nita Sells, MD  levothyroxine (SYNTHROID) 150 MCG tablet Take 150 mcg by mouth daily. 09/01/20  Yes [provider]  methocarbamol (ROBAXIN) 500 MG tablet Take 500 mg by mouth every 8 (eight) hours as needed for muscle spasms. 11/27/20  Yes [provider]  oxybutynin (DITROPAN-XL) 10 MG 24 hr tablet Take 10 mg by mouth  at bedtime.   Yes [provider]  pramipexole (MIRAPEX) 1.5 MG tablet Take 1.5 mg by mouth 3 (three) times daily. 06/29/20  Yes [provider]  loperamide (IMODIUM) 2 MG capsule Take 1 capsule (2 mg total) by mouth every 4 (four) hours. Patient not taking: Reported on 12/18/2020 12/08/20   Nita Sells, MD  ondansetron (ZOFRAN-ODT) 4 MG disintegrating tablet Take 4 mg by mouth every 8 (eight) hours as needed for nausea or vomiting. Patient not taking: Reported on 12/18/2020 09/02/20   [provider]     Allergies    Pentosan polysulfate  sodium, Elmiron [pentosan polysulfate], and Naloxone   Review of Systems   Review of Systems Unable to assess due to mental status.    Physical Exam BP (!) 104/55   Pulse (!) 103   Temp (!) 101.3 F (38.5 C) (Rectal)   Resp 17   SpO2 98%   Physical Exam Vitals and nursing note reviewed.  Constitutional:      Appearance: Normal appearance.  HENT:     Head: Normocephalic and atraumatic.     Nose: Nose normal.     Mouth/Throat:     Mouth: Mucous membranes are moist.  Eyes:     Extraocular Movements: Extraocular movements intact.     Conjunctiva/sclera: Conjunctivae normal.  Cardiovascular:     Rate and Rhythm: Tachycardia present.  Pulmonary:     Effort: Pulmonary effort is normal.     Breath sounds: Wheezing and rales present.  Abdominal:     General: Abdomen is flat.     Palpations: Abdomen is soft.     Tenderness: There is no abdominal tenderness.  Musculoskeletal:        General: No swelling. Normal range of motion.     Cervical back: Neck supple.  Skin:    General: Skin is warm and dry.  Neurological:     General: No focal deficit present.     Mental Status: She is alert. She is disoriented.     Motor: No weakness.  Psychiatric:        Mood and Affect: Mood normal.     ED Results / Procedures / Treatments   Labs (all labs ordered are listed, but only abnormal results are displayed) Labs Reviewed  LACTIC ACID, PLASMA - Abnormal; Notable for the following components:      Result Value   Lactic Acid, Venous 2.1 (*)    All other components within normal limits  COMPREHENSIVE METABOLIC PANEL - Abnormal; Notable for the following components:   Sodium 126 (*)    Chloride 94 (*)    CO2 19 (*)    Glucose, Bld 129 (*)    BUN 37 (*)    Creatinine, Ser 2.20 (*)    Calcium 7.0 (*)    Total Protein 6.1 (*)    Albumin 2.3 (*)    AST 43 (*)    Alkaline Phosphatase 217 (*)    Total Bilirubin 1.6 (*)    GFR, Estimated 23 (*)    All other components within  normal limits  CBC WITH DIFFERENTIAL/PLATELET - Abnormal; Notable for the following components:   RBC 3.08 (*)    Hemoglobin 8.9 (*)    HCT 27.3 (*)    Platelets 69 (*)    Lymphs Abs 0.3 (*)    All other components within normal limits  RESP PANEL BY RT-PCR (FLU A&B, COVID) [id number]  CULTURE, BLOOD (ROUTINE X 2)  CULTURE, BLOOD (ROUTINE X 2)  URINE CULTURE  LACTIC ACID, PLASMA  PROTIME-INR  APTT  URINALYSIS, ROUTINE W REFLEX MICROSCOPIC  MAGNESIUM  MAGNESIUM  COMPREHENSIVE METABOLIC PANEL  CBC WITH DIFFERENTIAL/PLATELET    EKG EKG Interpretation  Date/Time:  Friday December 18 2020 19:26:14 EST Ventricular Rate:  91 PR Interval:  152 QRS Duration: 96 QT Interval:  352 QTC Calculation: 433 R Axis:   -32 Text Interpretation: Sinus rhythm Left axis deviation Low voltage, precordial leads No significant change since last tracing Confirmed by Calvert Cantor [phone]) on 12/18/2020 8:57:32 PM  Radiology DG Chest Port 1 View  Result Date: 12/18/2020 CLINICAL DATA:  Initial evaluation for questionable sepsis. EXAM: PORTABLE CHEST 1 VIEW COMPARISON:  Prior radiograph from 12/04/2020. FINDINGS: Dual lead left-sided pacemaker/AICD in place. Mild cardiomegaly, stable. Mediastinal silhouette within normal limits. Aortic atherosclerosis. Lungs are hypoinflated. Mild diffuse pulmonary vascular congestion. No pleural effusion. No consolidative airspace disease or focal infiltrate. No pneumothorax. Right shoulder arthroplasty noted.  No acute osseous finding. IMPRESSION: 1. Cardiomegaly with mild diffuse pulmonary vascular congestion. 2. No other active cardiopulmonary disease. 3.  Aortic Atherosclerosis (ICD10-I70.0). Electronically Signed   By: Jeannine Boga M.D.   On: 12/18/2020 20:38    Procedures Procedures  Medications Ordered in the ED Medications  lactated ringers infusion ( Intravenous New Bag/Given 12/18/20 2130)  vancomycin (VANCOREADY) IVPB 1750 mg/350 mL (1,750 mg  Intravenous New Bag/Given 12/18/20 2241)  acetaminophen (TYLENOL) tablet 650 mg (has no administration in time range)  acetaminophen (TYLENOL) tablet 650 mg (has no administration in time range)    Or  acetaminophen (TYLENOL) suppository 650 mg (has no administration in time range)  lactated ringers bolus 1,000 mL (0 mLs Intravenous Stopped 12/18/20 2100)  ceFEPIme (MAXIPIME) 2 g in sodium chloride 0.9 % 100 mL IVPB (0 g Intravenous Stopped 12/18/20 2050)  metroNIDAZOLE (FLAGYL) IVPB 500 mg (0 mg Intravenous Stopped 12/18/20 2210)  fentaNYL (SUBLIMAZE) injection 50 mcg (50 mcg Intravenous Given 12/18/20 2002)  ipratropium-albuterol (DUONEB) 0.5-2.5 (3) MG/3ML nebulizer solution 3 mL (3 mLs Nebulization Given 12/18/20 2109)  fentaNYL (SUBLIMAZE) injection 50 mcg (50 mcg Intravenous Given 12/18/20 2140)     MDM Rules/Calculators/A&P MDM  Patient with reported fevers, cough and AMS from home. Low grade oral temp here, will check rectal. Also noted to be tachycardic, will begin Sepsis order set. Fentanyl for back pain.   ED Course  I have reviewed the triage vital signs and the nursing notes.  Pertinent labs & imaging results that were available during my care of the patient were reviewed by me and considered in my medical decision making (see chart for details).  Clinical Course as of 12/18/20 2341  Ludwig Clarks Dec 18, 2020  2123 Coags normal.  [CS]  2152 CMP with AKI, hyponatremia, metabolic acidosis.  [CS]  2214 Lactic acid is mildly elevated, will recheck after IVF. Given borderline BP, will give full 30cc/kg bolus. CBC with anemia but no leukocytosis.  [CS]  2233 Lactic acid is not elevated. She appears more comfortable. BP and HR improving. Will hold off on full IVF bolus given signs of vascular congestion on CXR.  [CS]  2245 Repeat lactic acid is normal. Rectal temp confirmed positive.  [CS]  [id number] Covid/Flu neg. Will discuss admission with Hospitalist.  [CS]  2312 Spoke with Dr. Domenick Gong,  Hospitalist, who will evaluate for admission.  [CS]    Clinical Course User Index [CS] Truddie Hidden, MD    Final Clinical Impression(s) / ED Diagnoses Final diagnoses:  Sepsis with acute renal  failure without septic shock, due to unspecified organism, unspecified acute renal failure type (HCC)  AKI (acute kidney injury) [facility])    Rx / DC Orders ED Discharge Orders     None        Pollyann Savoy, MD 12/18/20 2341

## 2020-12-19 ENCOUNTER — Inpatient Hospital Stay (HOSPITAL_COMMUNITY): Payer: Medicare Other

## 2020-12-19 ENCOUNTER — Encounter (HOSPITAL_COMMUNITY): Payer: Self-pay | Admitting: Internal Medicine

## 2020-12-19 DIAGNOSIS — A419 Sepsis, unspecified organism: Secondary | ICD-10-CM | POA: Diagnosis present

## 2020-12-19 DIAGNOSIS — R609 Edema, unspecified: Secondary | ICD-10-CM | POA: Diagnosis not present

## 2020-12-19 DIAGNOSIS — I4891 Unspecified atrial fibrillation: Secondary | ICD-10-CM

## 2020-12-19 DIAGNOSIS — G9341 Metabolic encephalopathy: Secondary | ICD-10-CM | POA: Diagnosis present

## 2020-12-19 DIAGNOSIS — R6520 Severe sepsis without septic shock: Secondary | ICD-10-CM | POA: Diagnosis not present

## 2020-12-19 DIAGNOSIS — E871 Hypo-osmolality and hyponatremia: Secondary | ICD-10-CM | POA: Diagnosis present

## 2020-12-19 DIAGNOSIS — B9620 Unspecified Escherichia coli [E. coli] as the cause of diseases classified elsewhere: Secondary | ICD-10-CM | POA: Insufficient documentation

## 2020-12-19 DIAGNOSIS — I5032 Chronic diastolic (congestive) heart failure: Secondary | ICD-10-CM | POA: Diagnosis present

## 2020-12-19 DIAGNOSIS — D649 Anemia, unspecified: Secondary | ICD-10-CM | POA: Diagnosis present

## 2020-12-19 DIAGNOSIS — D696 Thrombocytopenia, unspecified: Secondary | ICD-10-CM | POA: Diagnosis present

## 2020-12-19 DIAGNOSIS — R7881 Bacteremia: Secondary | ICD-10-CM

## 2020-12-19 LAB — BLOOD CULTURE ID PANEL (REFLEXED) - BCID2

## 2020-12-19 LAB — CBC WITH DIFFERENTIAL/PLATELET
Abs Immature Granulocytes: 0 10*3/uL (ref 0.00–0.07)
Band Neutrophils: 3 %
Basophils Absolute: 0 10*3/uL (ref 0.0–0.1)
Basophils Relative: 0 %
Eosinophils Absolute: 0 10*3/uL (ref 0.0–0.5)
Eosinophils Relative: 0 %
HCT: 26.5 % — ABNORMAL LOW (ref 36.0–46.0)
Hemoglobin: 8.6 g/dL — ABNORMAL LOW (ref 12.0–15.0)
Lymphocytes Relative: 6 %
Lymphs Abs: 0.3 10*3/uL — ABNORMAL LOW (ref 0.7–4.0)
MCH: 29.7 pg (ref 26.0–34.0)
MCHC: 32.5 g/dL (ref 30.0–36.0)
MCV: 91.4 fL (ref 80.0–100.0)
Monocytes Absolute: 0.2 10*3/uL (ref 0.1–1.0)
Monocytes Relative: 4 %
Neutro Abs: 4 10*3/uL (ref 1.7–7.7)
Neutrophils Relative %: 87 %
Platelets: 57 10*3/uL — ABNORMAL LOW (ref 150–400)
RBC: 2.9 MIL/uL — ABNORMAL LOW (ref 3.87–5.11)
RDW: 15.6 % — ABNORMAL HIGH (ref 11.5–15.5)
WBC: 4.4 10*3/uL (ref 4.0–10.5)
nRBC: 0 % (ref 0.0–0.2)
nRBC: 0 /100 WBC

## 2020-12-19 LAB — RESPIRATORY PANEL BY PCR

## 2020-12-19 LAB — I-STAT ARTERIAL BLOOD GAS, ED
Acid-base deficit: 6 mmol/L — ABNORMAL HIGH (ref 0.0–2.0)
Bicarbonate: 18.3 mmol/L — ABNORMAL LOW (ref 20.0–28.0)
Calcium, Ion: 0.97 mmol/L — ABNORMAL LOW (ref 1.15–1.40)
HCT: 25 % — ABNORMAL LOW (ref 36.0–46.0)
Hemoglobin: 8.5 g/dL — ABNORMAL LOW (ref 12.0–15.0)
O2 Saturation: 96 %
Potassium: 3.8 mmol/L (ref 3.5–5.1)
Sodium: 129 mmol/L — ABNORMAL LOW (ref 135–145)
TCO2: 19 mmol/L — ABNORMAL LOW (ref 22–32)
pCO2 arterial: 30.6 mmHg — ABNORMAL LOW (ref 32.0–48.0)
pH, Arterial: 7.384 (ref 7.350–7.450)
pO2, Arterial: 83 mmHg (ref 83.0–108.0)

## 2020-12-19 LAB — BRAIN NATRIURETIC PEPTIDE: B Natriuretic Peptide: 199.7 pg/mL — ABNORMAL HIGH (ref 0.0–100.0)

## 2020-12-19 LAB — MRSA NEXT GEN BY PCR, NASAL: MRSA by PCR Next Gen: DETECTED — AB

## 2020-12-19 LAB — T4, FREE: Free T4: 1.28 ng/dL — ABNORMAL HIGH (ref 0.61–1.12)

## 2020-12-19 LAB — URINALYSIS, ROUTINE W REFLEX MICROSCOPIC
Bilirubin Urine: NEGATIVE
Glucose, UA: >=500 mg/dL — AB
Ketones, ur: 5 mg/dL — AB
Nitrite: NEGATIVE
Protein, ur: 30 mg/dL — AB
Specific Gravity, Urine: 1.011 (ref 1.005–1.030)
pH: 5 (ref 5.0–8.0)

## 2020-12-19 LAB — OSMOLALITY: Osmolality: 276 mOsm/kg (ref 275–295)

## 2020-12-19 LAB — CBG MONITORING, ED
Glucose-Capillary: 94 mg/dL (ref 70–99)
Glucose-Capillary: 104 mg/dL — ABNORMAL HIGH (ref 70–99)
Glucose-Capillary: 73 mg/dL (ref 70–99)

## 2020-12-19 LAB — RAPID URINE DRUG SCREEN, HOSP PERFORMED
Amphetamines: NOT DETECTED
Barbiturates: NOT DETECTED
Benzodiazepines: NOT DETECTED
Cocaine: NOT DETECTED
Opiates: NOT DETECTED
Tetrahydrocannabinol: NOT DETECTED

## 2020-12-19 LAB — COMPREHENSIVE METABOLIC PANEL
ALT: 27 U/L (ref 0–44)
AST: 39 U/L (ref 15–41)
Albumin: 2.2 g/dL — ABNORMAL LOW (ref 3.5–5.0)
Alkaline Phosphatase: 172 U/L — ABNORMAL HIGH (ref 38–126)
Anion gap: 13 (ref 5–15)
BUN: 35 mg/dL — ABNORMAL HIGH (ref 8–23)
CO2: 17 mmol/L — ABNORMAL LOW (ref 22–32)
Calcium: 6.8 mg/dL — ABNORMAL LOW (ref 8.9–10.3)
Chloride: 96 mmol/L — ABNORMAL LOW (ref 98–111)
Creatinine, Ser: 1.98 mg/dL — ABNORMAL HIGH (ref 0.44–1.00)
GFR, Estimated: 26 mL/min — ABNORMAL LOW (ref >60)
Glucose, Bld: 99 mg/dL (ref 70–99)
Potassium: 3.8 mmol/L (ref 3.5–5.1)
Sodium: 126 mmol/L — ABNORMAL LOW (ref 135–145)
Total Bilirubin: 1.8 mg/dL — ABNORMAL HIGH (ref 0.3–1.2)
Total Protein: 5.7 g/dL — ABNORMAL LOW (ref 6.5–8.1)

## 2020-12-19 LAB — PROCALCITONIN: Procalcitonin: 16.24 ng/mL

## 2020-12-19 LAB — GLUCOSE, CAPILLARY
Glucose-Capillary: 86 mg/dL (ref 70–99)
Glucose-Capillary: 92 mg/dL (ref 70–99)

## 2020-12-19 LAB — CREATININE, URINE, RANDOM: Creatinine, Urine: 57.67 mg/dL

## 2020-12-19 LAB — STREP PNEUMONIAE URINARY ANTIGEN: Strep Pneumo Urinary Antigen: NEGATIVE

## 2020-12-19 LAB — OSMOLALITY, URINE: Osmolality, Ur: 364 mOsm/kg (ref 300–900)

## 2020-12-19 LAB — D-DIMER, QUANTITATIVE: D-Dimer, Quant: 8.61 ug/mL-FEU — ABNORMAL HIGH (ref 0.00–0.50)

## 2020-12-19 LAB — SODIUM, URINE, RANDOM: Sodium, Ur: 26 mmol/L

## 2020-12-19 LAB — MAGNESIUM: Magnesium: 1.5 mg/dL — ABNORMAL LOW (ref 1.7–2.4)

## 2020-12-19 LAB — TSH: TSH: 5.442 u[IU]/mL — ABNORMAL HIGH (ref 0.350–4.500)

## 2020-12-19 MED ORDER — ACETAMINOPHEN 325 MG PO TABS
650.0000 mg | ORAL_TABLET | Freq: Once | ORAL | Status: AC
  Administered 2020-12-19: 650 mg via ORAL
  Filled 2020-12-19: qty 2

## 2020-12-19 MED ORDER — HYDROXYZINE HCL 25 MG PO TABS
25.0000 mg | ORAL_TABLET | Freq: Every evening | ORAL | Status: DC | PRN
  Administered 2020-12-19 – 2021-01-02 (×3): 25 mg via ORAL
  Filled 2020-12-19 (×4): qty 1

## 2020-12-19 MED ORDER — MUPIROCIN 2 % EX OINT
1.0000 "application " | TOPICAL_OINTMENT | Freq: Two times a day (BID) | CUTANEOUS | Status: AC
  Administered 2020-12-19 – 2020-12-24 (×10): 1 via NASAL
  Filled 2020-12-19 (×2): qty 22

## 2020-12-19 MED ORDER — INSULIN ASPART 100 UNIT/ML IJ SOLN: 0.0000 [IU] | Freq: Three times a day (TID) | INTRAMUSCULAR | Status: DC

## 2020-12-19 MED ORDER — FUROSEMIDE 10 MG/ML IJ SOLN
40.0000 mg | Freq: Two times a day (BID) | INTRAMUSCULAR | Status: DC
  Administered 2020-12-19: 40 mg via INTRAVENOUS
  Filled 2020-12-19: qty 4

## 2020-12-19 MED ORDER — MAGNESIUM SULFATE 2 GM/50ML IV SOLN
2.0000 g | Freq: Once | INTRAVENOUS | Status: AC
  Administered 2020-12-19: 2 g via INTRAVENOUS
  Filled 2020-12-19: qty 50

## 2020-12-19 MED ORDER — LIP MEDEX EX OINT
TOPICAL_OINTMENT | CUTANEOUS | Status: DC | PRN
  Administered 2020-12-19: 75 via TOPICAL
  Filled 2020-12-19: qty 7

## 2020-12-19 MED ORDER — CALCIUM GLUCONATE-NACL 1-0.675 GM/50ML-% IV SOLN
1.0000 g | Freq: Once | INTRAVENOUS | Status: AC
  Administered 2020-12-19: 1000 mg via INTRAVENOUS
  Filled 2020-12-19: qty 50

## 2020-12-19 MED ORDER — VANCOMYCIN VARIABLE DOSE PER UNSTABLE RENAL FUNCTION (PHARMACIST DOSING): Status: DC

## 2020-12-19 MED ORDER — PRAMIPEXOLE DIHYDROCHLORIDE 1.5 MG PO TABS
1.5000 mg | ORAL_TABLET | Freq: Three times a day (TID) | ORAL | Status: DC
  Administered 2020-12-19 – 2021-01-08 (×58): 1.5 mg via ORAL
  Filled 2020-12-19 (×65): qty 1

## 2020-12-19 MED ORDER — CHLORHEXIDINE GLUCONATE CLOTH 2 % EX PADS
6.0000 | MEDICATED_PAD | Freq: Every day | CUTANEOUS | Status: AC
  Administered 2020-12-20 – 2020-12-23 (×4): 6 via TOPICAL

## 2020-12-19 MED ORDER — SODIUM CHLORIDE 0.9 % IV SOLN: 2.0000 g | INTRAVENOUS | Status: DC

## 2020-12-19 MED ORDER — ORAL CARE MOUTH RINSE
15.0000 mL | Freq: Two times a day (BID) | OROMUCOSAL | Status: DC
  Administered 2020-12-19 – 2021-01-08 (×39): 15 mL via OROMUCOSAL

## 2020-12-19 MED ORDER — OYSTER SHELL CALCIUM/D3 500-5 MG-MCG PO TABS
0.5000 | ORAL_TABLET | Freq: Two times a day (BID) | ORAL | Status: DC
  Administered 2020-12-20 – 2021-01-08 (×38): 0.5 via ORAL
  Filled 2020-12-19 (×40): qty 1

## 2020-12-19 MED ORDER — FENTANYL CITRATE PF 50 MCG/ML IJ SOSY
25.0000 ug | PREFILLED_SYRINGE | INTRAMUSCULAR | Status: DC | PRN
  Administered 2020-12-19 – 2020-12-20 (×6): 25 ug via INTRAVENOUS
  Filled 2020-12-19 (×7): qty 1

## 2020-12-19 MED ORDER — SODIUM CHLORIDE 0.9 % IV BOLUS
500.0000 mL | Freq: Once | INTRAVENOUS | Status: AC
  Administered 2020-12-19: 1000 mL via INTRAVENOUS

## 2020-12-19 MED ORDER — SODIUM CHLORIDE 0.9 % IV SOLN
2.0000 g | INTRAVENOUS | Status: DC
  Administered 2020-12-19: 2 g via INTRAVENOUS
  Filled 2020-12-19: qty 20

## 2020-12-19 MED ORDER — IPRATROPIUM-ALBUTEROL 0.5-2.5 (3) MG/3ML IN SOLN
3.0000 mL | Freq: Four times a day (QID) | RESPIRATORY_TRACT | Status: DC | PRN
  Administered 2020-12-19 – 2021-01-02 (×6): 3 mL via RESPIRATORY_TRACT
  Filled 2020-12-19 (×5): qty 3

## 2020-12-19 MED ORDER — LEVOTHYROXINE SODIUM 75 MCG PO TABS
150.0000 ug | ORAL_TABLET | Freq: Every day | ORAL | Status: DC
  Administered 2020-12-19: 150 ug via ORAL
  Filled 2020-12-19: qty 2

## 2020-12-19 MED ORDER — SODIUM CHLORIDE 0.9 % IV BOLUS
500.0000 mL | Freq: Once | INTRAVENOUS | Status: AC
  Administered 2020-12-19: 500 mL via INTRAVENOUS

## 2020-12-19 MED ORDER — CALCIUM CARBONATE ANTACID 500 MG PO CHEW
800.0000 mg | CHEWABLE_TABLET | Freq: Two times a day (BID) | ORAL | Status: DC
Start: 2020-12-19 — End: 2021-01-09
  Administered 2020-12-19: 800 mg via ORAL
  Administered 2020-12-19: 200 mg via ORAL
  Administered 2020-12-20 – 2021-01-08 (×37): 800 mg via ORAL
  Filled 2020-12-19 (×43): qty 4

## 2020-12-19 MED ORDER — AMIODARONE HCL IN DEXTROSE 360-4.14 MG/200ML-% IV SOLN
60.0000 mg/h | INTRAVENOUS | Status: AC
  Administered 2020-12-20: 01:00:00 60 mg/h via INTRAVENOUS
  Filled 2020-12-19: qty 200

## 2020-12-19 MED ORDER — NALOXONE HCL 0.4 MG/ML IJ SOLN: 0.4000 mg | INTRAMUSCULAR | Status: DC | PRN

## 2020-12-19 MED ORDER — DILTIAZEM HCL 25 MG/5ML IV SOLN
15.0000 mg | Freq: Once | INTRAVENOUS | Status: AC
  Administered 2020-12-19: 15 mg via INTRAVENOUS
  Filled 2020-12-19: qty 5

## 2020-12-19 MED ORDER — AMIODARONE HCL IN DEXTROSE 360-4.14 MG/200ML-% IV SOLN
30.0000 mg/h | INTRAVENOUS | Status: DC
  Administered 2020-12-20 – 2020-12-23 (×7): 30 mg/h via INTRAVENOUS
  Filled 2020-12-19 (×7): qty 200

## 2020-12-19 NOTE — ED Notes (Signed)
RN walked into pt room to start meds and draw labs. Pt was tearful, daughter at bedside tearful and panicked as well. Pt daughter stated the pt called her and "was told by a MD that she was going to die." Pt and daughter is requesting to speak with the MD before RN can start meds. RN messaged admitting MD. RN reassured pt and daughter.

## 2020-12-19 NOTE — Progress Notes (Signed)
PHARMACY - PHYSICIAN COMMUNICATION CRITICAL VALUE ALERT - BLOOD CULTURE IDENTIFICATION (BCID)  Jillian Leblanc is an 75 y.o. female who presented to [facility] on 12/18/2020 with a chief complaint of non-productive cough, AKI, and sepsis.   Assessment:  Blood cultures with 2 out of 3 growing GNR and BCID positive for Ecoli (no resistance detected). Signs and symptoms concerning for pyelonephritis. CXR negative for acute cardiopulmonary process per H&P on 11/25. MRSA PCR positive.   Name of physician (or Provider) Contacted: Dr. Jacqulyn Bath  Current antibiotics: Vancomycin, Cefepime  Changes to prescribed antibiotics recommended:  Recommendations accepted by provider Change to Ceftriaxone 2g IV every 24 hours.   Results for orders placed or performed during the hospital encounter of 12/18/20  Blood Culture ID Panel (Reflexed) (Collected: 12/18/2020  7:49 PM)  Result Value Ref Range   Enterococcus faecalis NOT DETECTED NOT DETECTED   Enterococcus Faecium NOT DETECTED NOT DETECTED   Listeria monocytogenes NOT DETECTED NOT DETECTED   Staphylococcus species NOT DETECTED NOT DETECTED   Staphylococcus aureus (BCID) NOT DETECTED NOT DETECTED   Staphylococcus epidermidis NOT DETECTED NOT DETECTED   Staphylococcus lugdunensis NOT DETECTED NOT DETECTED   Streptococcus species NOT DETECTED NOT DETECTED   Streptococcus agalactiae NOT DETECTED NOT DETECTED   Streptococcus pneumoniae NOT DETECTED NOT DETECTED   Streptococcus pyogenes NOT DETECTED NOT DETECTED   A.calcoaceticus-baumannii NOT DETECTED NOT DETECTED   Bacteroides fragilis NOT DETECTED NOT DETECTED   Enterobacterales DETECTED (A) NOT DETECTED   Enterobacter cloacae complex NOT DETECTED NOT DETECTED   Escherichia coli DETECTED (A) NOT DETECTED   Klebsiella aerogenes NOT DETECTED NOT DETECTED   Klebsiella oxytoca NOT DETECTED NOT DETECTED   Klebsiella pneumoniae NOT DETECTED NOT DETECTED   Proteus species NOT DETECTED NOT DETECTED    Salmonella species NOT DETECTED NOT DETECTED   Serratia marcescens NOT DETECTED NOT DETECTED   Haemophilus influenzae NOT DETECTED NOT DETECTED   Neisseria meningitidis NOT DETECTED NOT DETECTED   Pseudomonas aeruginosa NOT DETECTED NOT DETECTED   Stenotrophomonas maltophilia NOT DETECTED NOT DETECTED   Candida albicans NOT DETECTED NOT DETECTED   Candida auris NOT DETECTED NOT DETECTED   Candida glabrata NOT DETECTED NOT DETECTED   Candida krusei NOT DETECTED NOT DETECTED   Candida parapsilosis NOT DETECTED NOT DETECTED   Candida tropicalis NOT DETECTED NOT DETECTED   Cryptococcus neoformans/gattii NOT DETECTED NOT DETECTED   CTX-M ESBL NOT DETECTED NOT DETECTED   Carbapenem resistance IMP NOT DETECTED NOT DETECTED   Carbapenem resistance KPC NOT DETECTED NOT DETECTED   Carbapenem resistance NDM NOT DETECTED NOT DETECTED   Carbapenem resist OXA 48 LIKE NOT DETECTED NOT DETECTED   Carbapenem resistance VIM NOT DETECTED NOT DETECTED    Fayne Norrie 12/19/2020  10:19 AM

## 2020-12-19 NOTE — Progress Notes (Signed)
Patient developed new onset atrial fibrillation with RVR with rates around 154.  EKG completed and atrial fibrillation confirmed.  Blood pressure around 110/90.  Ordered 1 dose of IV Cardizem 15 mg.  Despite of that, patient's heart rate remained around 140.  Patient asymptomatic.  Blood pressure still around same area.  I have consulted and discussed case in length with cardiology fellow Dr. Welton Flakes And have requested him to see this patient in consultation and manage patient's atrial fibrillation.  Unfortunately due to severe thrombocytopenia, we are unable to start this patient on any anticoagulation.  Doppler lower extremity basically inconclusive for DVT.  High D-dimer, and now new onset atrial fibrillation raises concern for PE.  VQ scan was ordered this morning as a stat, still pending.  Again, not safe to start any anticoagulation empirically in the setting of severe thrombocytopenia.

## 2020-12-19 NOTE — Consult Note (Signed)
Cardiology Consultation:   Patient ID: Jillian Leblanc MRN: [id number]; DOB: Nov 18, 1945  Admit date: 12/18/2020 Date of Consult: 12/19/2020  PCP:  Guadlupe Spanish, MD   [facility] Providers Cardiologist:  None  Electrophysiologist:  Will Meredith Leeds, MD       Patient Profile:   Jillian Leblanc is a 75 y.o. 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 Doristine Bosworth.   History of Present Illness:   [name] is a 75 y.o. 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 Doristine Bosworth. She was admitted to [facility] on 12/18/2020 with severe sepsis after presenting from home to [facility] ED complaining of cough. She was found to have Severe sepsis secondary to UTI and possible L5-S1 abscess/osteomyelitis?/Gram-negative bacteremia. She was started on broad Abx; her SBP have been soft. This afternoon she went in to Afib RVR HR 130-140s hence cardiology has been consulted. When I evaluated her, she was moaning in pain. She was very anxious and complaining of pain. No CP. HR was in 130/140s. She was given 1 dose IV Dilt by primary team. Her SBP were in 100s.    Past Medical History:  Diagnosis Date   Chronic back pain    Diabetes mellitus without complication ([place])    Interstitial cystitis    Thyroid disease     Past Surgical History:  Procedure Laterality Date   ABDOMINAL HYSTERECTOMY     CHOLECYSTECTOMY     NECK SURGERY     PACEMAKER IMPLANT N/A 08/16/2019   Procedure: PACEMAKER IMPLANT;  Surgeon: Constance Haw, MD;  Location: [facility];  Service: Cardiovascular;  Laterality: N/A;   REVERSE SHOULDER ARTHROPLASTY Right 07/28/2020   Procedure: REVERSE SHOULDER ARTHROPLASTY;  Surgeon: Justice Britain, MD;  Location: [facility];  Service: Orthopedics;  Laterality: Right;   SHOULDER SURGERY     THYROIDECTOMY         Inpatient Medications: Scheduled Meds:  acetaminophen  650 mg Oral Once   calcium carbonate  800 mg of elemental calcium Oral BID   [START ON 12/20/2020] calcium-vitamin D  0.5 tablet Oral BID   [START ON 12/20/2020] Chlorhexidine Gluconate Cloth  6 each Topical Q0600   insulin aspart  0-6 Units Subcutaneous TID WC   levothyroxine  150 mcg Oral Daily   mupirocin ointment  1 application Nasal BID   pramipexole  1.5 mg Oral TID   Continuous Infusions:  cefTRIAXone (ROCEPHIN)  IV     PRN Meds: acetaminophen **OR** acetaminophen, fentaNYL (SUBLIMAZE) injection, hydrOXYzine, ipratropium-albuterol, naLOXone (NARCAN)  injection  Allergies:    Allergies  Allergen Reactions   Pentosan Polysulfate Sodium     Other reaction(s): Other (See Comments) ELMIRON Y Drug hair fell out 10/21/2009 12:00:00 AM by [name] CNA 1   Elmiron [Pentosan Polysulfate]    Naloxone Other (See Comments)    Hallucinations Confusion Nightmares    Social History:   Social History   Socioeconomic History   Marital status: Married    Spouse name: Not on file   Number of children: Not on file   Years of education: Not on  file   Highest education level: Not on file  Occupational History   Not on file  Tobacco Use   Smoking status: Never   Smokeless tobacco: Never  Vaping Use   Vaping Use: Never used  Substance and Sexual Activity   Alcohol use: No   Drug use: Never   Sexual activity: Not on file  Other Topics Concern   Not on file  Social History Narrative   Not on file   Social Determinants of Health   Financial Resource Strain: Not on file  Food Insecurity: Not on file   Transportation Needs: Not on file  Physical Activity: Not on file  Stress: Not on file  Social Connections: Not on file  Intimate Partner Violence: Not on file    Family History:   No sudden cardiac deaths  ROS:  Please see the history of present illness.  All other ROS reviewed and negative.     Physical Exam/Data:   Vitals:   12/19/20 1623 12/19/20 1702 12/19/20 1721 12/19/20 1729  BP: 103/76   (!) 112/98  Pulse: (!) 113  (!) 153 (!) 156  Resp: 20  20 (!) 30  Temp:    98.1 F (36.7 C)  TempSrc:    Oral  SpO2: 96% 100% 99% 92%    Intake/Output Summary (Last 24 hours) at 12/19/2020 1856 Last data filed at 12/19/2020 1324 Gross per 24 hour  Intake 2716.56 ml  Output --  Net 2716.56 ml   Last 3 Weights 12/08/2020 12/08/2020 12/07/2020  Weight (lbs) 194 lb 3.6 oz 188 lb 0.8 oz 196 lb 13.9 oz  Weight (kg) 88.1 kg 85.3 kg 89.3 kg     There is no height or weight on file to calculate BMI.  General:  Uncomfortable; in distress HEENT: normal Neck: no JVD Vascular: No carotid bruits; Distal pulses 2+ bilaterally Cardiac:  Irregular; tachycardic Lungs:  Poor airflow. On [place] Abd: soft, nontender, no hepatomegaly  Ext: no edema Psych:  In pain  EKG:  The EKG was personally reviewed and demonstrates:  Afib  Laboratory Data:  High Sensitivity Troponin:  No results for input(s): TROPONINIHS in the last 720 hours.   Chemistry Recent Labs  Lab 12/18/20 1949 12/19/20 0208 12/19/20 0237  NA 126* 129* 126*  K 4.2 3.8 3.8  CL 94*  --  96*  CO2 19*  --  17*  GLUCOSE 129*  --  99  BUN 37*  --  35*  CREATININE 2.20*  --  1.98*  CALCIUM 7.0*  --  6.8*  MG  --   --  1.5*  GFRNONAA 23*  --  26*  ANIONGAP 13  --  13    Recent Labs  Lab 12/18/20 1949 12/19/20 0237  PROT 6.1* 5.7*  ALBUMIN 2.3* 2.2*  AST 43* 39  ALT 29 27  ALKPHOS 217* 172*  BILITOT 1.6* 1.8*   Lipids No results for input(s): CHOL, TRIG, HDL, LABVLDL, LDLCALC, CHOLHDL in the last 168 hours.   Hematology Recent Labs  Lab 12/18/20 1949 12/19/20 0208 12/19/20 0237  WBC 5.4  --  4.4  RBC 3.08*  --  2.90*  HGB 8.9* 8.5* 8.6*  HCT 27.3* 25.0* 26.5*  MCV 88.6  --  91.4  MCH 28.9  --  29.7  MCHC 32.6  --  32.5  RDW 15.5  --  15.6*  PLT 69*  --  57*   Thyroid  Recent Labs  Lab 12/19/20 0237 12/19/20 1235  TSH 5.442*  --  FREET4  --  1.28*    BNP Recent Labs  Lab 12/19/20 0237  BNP 199.7*    DDimer  Recent Labs  Lab 12/19/20 1235  DDIMER 8.61*     Radiology/Studies:  CT ABDOMEN PELVIS WO CONTRAST  Result Date: 12/19/2020 CLINICAL DATA:  75 year old female abdominal and pelvic pain. Sepsis. EXAM: CT ABDOMEN AND PELVIS WITHOUT CONTRAST TECHNIQUE: Multidetector CT imaging of the abdomen and pelvis was performed following the standard protocol without IV contrast. COMPARISON:  11/22/2020 lumbar spine CT 06/08/2018 CT FINDINGS: Please note that parenchymal and vascular abnormalities may be missed as intravenous contrast was not administered. Lower chest: No acute abnormality. Pacemaker/AICD leads are present. Hepatobiliary: No hepatic abnormalities are identified. The patient is status post cholecystectomy. There is no evidence of intrahepatic or extrahepatic biliary dilatation. 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 There is no evidence gas within the renal collecting system or renal parenchyma. No hydronephrosis is identified. Scattered areas of RIGHT renal scarring are present. The adrenal glands are unremarkable. Stomach/Bowel: There is no evidence of bowel obstruction, definite bowel wall thickening or bowel inflammatory changes. Vascular/Lymphatic: Aortic atherosclerosis. No enlarged abdominal or pelvic lymph nodes. Reproductive: Status post hysterectomy. No adnexal masses. Other: Subcutaneous edema is noted. There is no evidence of ascites, focal collection or  pneumoperitoneum. Musculoskeletal: A compression fracture of L5 is again identified, increased from 35% on 11/22/2020 to 50% today, now with irregularity of the L5 vertebral body. There are foci of gas in the L4-S1 paravertebral regions with small amount of gas posterior along the spinal canal at L5-S1. No discrete focal collection is identified on this noncontrast study. Disc protrusion at L5-S1 is again noted. A healing RIGHT L1 transverse process fracture remote T12 compression fracture is present. Sacral stimulator again noted IMPRESSION: 1. Increasing compression fracture and heterogeneity of L5 with new paravertebral gas from L4-S1 since 11/22/2020. Although L5 bony changes and paravertebral gas could be related to healing fracture and gas extension from vacuum disc, this is suspicious for infection/osteomyelitis given patient's sepsis. 2. 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 No evidence of gas within the renal collecting system or renal parenchyma. 3. Aortic Atherosclerosis (ICD10-I70.0). Critical Value/emergent results were called by telephone at the time of interpretation on 12/19/2020 at 10:58 am to provider [facility] , who verbally acknowledged these results. Electronically Signed   By: Harmon Pier M.D.   On: 12/19/2020 11:00   CT L-SPINE NO CHARGE  Result Date: 12/19/2020 CLINICAL DATA:  Epidural abscess.  Chronic back pain. EXAM: CT LUMBAR SPINE WITHOUT CONTRAST TECHNIQUE: Multiplanar CT image reconstructions of the lumbar spine were generated from data acquired during abdominopelvic CT of the same date. Abdominopelvic CT findings dictated separately. COMPARISON:  Abdominopelvic CT same date. Lumbar spine CT 11/22/2020. FINDINGS: Segmentation: There are 5 lumbar type vertebral bodies. Alignment: Normal. Vertebrae: Mildly progressive loss of vertebral body height at the L5 burst fracture compared with the CT from last month. There is approximately 40% loss of vertebral  body height with progressive irregular lucency and sclerosis in the L5 vertebral body and 1st sacral segment, suspicious for osteomyelitis. A chronic superior endplate compression deformity at T12 is unchanged. The sacroiliac joints appear unremarkable. There is a right sacral spinal stimulator. Paraspinal and other soft tissues: There is gas and soft tissue stranding within the paraspinal soft tissues at L4 and L5. No focal fluid collections are identified. There is gas within the  urinary bladder wall. Additional abdominopelvic findings deferred to separate abdominopelvic CT report. Disc levels: Stable chronic disc and endplate degeneration at T11-12, T12-L1 and L1-2. No significant disc space findings at L2-3 or L3-4. L4-5: Disc height is preserved. Annular disc bulging appears mildly progressive, and there is new gas within the left lateral recess. There is mild facet and ligamentous hypertrophy. Lateral recess and foraminal narrowing appears progressive. No drainable fluid collection identified. L5-S1: Interval decreased gas within the disc with progressive annular disc bulging and a right paracentral disc protrusion. As above, there are small collections of gas within the paraspinal soft tissues as well as the ventral epidural space. Lateral recess and foraminal narrowing appears progressive. No drainable fluid collection identified. IMPRESSION: 1. Progressive loss of vertebral body height at the L5 fracture compared with prior CT of 1 month ago. Osseous changes within the L5 vertebral body and upper sacrum are suspicious for developing osteomyelitis and discitis, especially given the paraspinal inflammatory changes and small gas collections. 2. Increased lateral recess and foraminal narrowing bilaterally at L4-5 and L5-S1, in part due to epidural gas. Assessment for epidural fluid collections is limited by noncontrast CT. Consider MRI for further evaluation if possible (patient has a sacral spinal stimulator).  If unable to undergo MRI, dedicated postcontrast imaging of the lumbar spine could be performed. 3. Abdominopelvic findings dictated separately. Electronically Signed   By: Richardean Sale M.D.   On: 12/19/2020 12:46   DG CHEST PORT 1 VIEW  Result Date: 12/19/2020 CLINICAL DATA:  Severe sepsis. EXAM: PORTABLE CHEST 1 VIEW COMPARISON:  12/18/2020. FINDINGS: The heart is enlarged and the pulmonary vasculature is mildly distended. Atherosclerotic calcification of the aorta is noted. Lung volumes are low. No consolidation, effusion, or pneumothorax. A dual lead pacemaker is present over the left chest. The bony structures are stable. IMPRESSION: Cardiomegaly with pulmonary vascular congestion. Electronically Signed   By: Brett Fairy M.D.   On: 12/19/2020 03:09   DG Chest Port 1 View  Result Date: 12/18/2020 CLINICAL DATA:  Initial evaluation for questionable sepsis. EXAM: PORTABLE CHEST 1 VIEW COMPARISON:  Prior radiograph from 12/04/2020. FINDINGS: Dual lead left-sided pacemaker/AICD in place. Mild cardiomegaly, stable. Mediastinal silhouette within normal limits. Aortic atherosclerosis. Lungs are hypoinflated. Mild diffuse pulmonary vascular congestion. No pleural effusion. No consolidative airspace disease or focal infiltrate. No pneumothorax. Right shoulder arthroplasty noted.  No acute osseous finding. IMPRESSION: 1. Cardiomegaly with mild diffuse pulmonary vascular congestion. 2. No other active cardiopulmonary disease. 3.  Aortic Atherosclerosis (ICD10-I70.0). Electronically Signed   By: Jeannine Boga M.D.   On: 12/18/2020 20:38   DG HIP UNILAT WITH PELVIS 2-3 VIEWS RIGHT  Result Date: 12/19/2020 CLINICAL DATA:  Right hip and back pain after multiple falls. EXAM: DG HIP (WITH OR WITHOUT PELVIS) 2-3V RIGHT COMPARISON:  12/04/2020. FINDINGS: No fracture or bone lesion. Hip joints, SI joints and symphysis pubis are normally spaced and aligned. Left lower quadrant stimulator device and right  mid pelvic lead are unchanged. Soft tissues otherwise unremarkable. IMPRESSION: 1. No fracture or acute finding. Electronically Signed   By: Lajean Manes M.D.   On: 12/19/2020 11:07   VAS [place] LOWER EXTREMITY VENOUS (DVT)  Result Date: 12/19/2020  Lower Venous DVT Study Patient Name:  OLUWANIFEMI PELISSIER  Date of Exam:   12/19/2020 Medical Rec #: [id number]       Accession #:    [id number] Date of Birth: February 03, 1945       Patient Gender: F Patient Age:   56  years Exam Location:  [facility] Procedure:      VAS [place] LOWER EXTREMITY VENOUS (DVT) Referring Phys: RAVI PAHWANI --------------------------------------------------------------------------------  Indications: Edema.  Risk Factors: CHF. Limitations: Body habitus and patient intolerance to compression at some segments. Comparison Study: No prior studies. Performing Technologist: Darlin Coco RDMS, RVT  Examination Guidelines: A complete evaluation includes B-mode imaging, spectral Doppler, color Doppler, and power Doppler as needed of all accessible portions of each vessel. Bilateral testing is considered an integral part of a complete examination. Limited examinations for reoccurring indications may be performed as noted. The reflux portion of the exam is performed with the patient in reverse Trendelenburg.  +---------+---------------+---------+-----------+----------+-------------------+ RIGHT    CompressibilityPhasicitySpontaneityPropertiesThrombus Aging      +---------+---------------+---------+-----------+----------+-------------------+ CFV                     Yes      Yes                  Unable to compress                                                        due to patient                                                            discomfort          +---------+---------------+---------+-----------+----------+-------------------+ SFJ      Full                                                              +---------+---------------+---------+-----------+----------+-------------------+ FV Prox  Full                                                             +---------+---------------+---------+-----------+----------+-------------------+ FV Mid   Full                                                             +---------+---------------+---------+-----------+----------+-------------------+ FV DistalFull                                                             +---------+---------------+---------+-----------+----------+-------------------+ PFV      Full                                                             +---------+---------------+---------+-----------+----------+-------------------+  POP      Full           Yes      Yes                                      +---------+---------------+---------+-----------+----------+-------------------+ PTV      Full                                                             +---------+---------------+---------+-----------+----------+-------------------+ PERO     Full                                                             +---------+---------------+---------+-----------+----------+-------------------+   +---------+---------------+---------+-----------+----------+--------------+ LEFT     CompressibilityPhasicitySpontaneityPropertiesThrombus Aging +---------+---------------+---------+-----------+----------+--------------+ CFV      Full           Yes      Yes                                 +---------+---------------+---------+-----------+----------+--------------+ SFJ      Full                                                        +---------+---------------+---------+-----------+----------+--------------+ FV Prox  Full                                                        +---------+---------------+---------+-----------+----------+--------------+ FV Mid   Full                                                         +---------+---------------+---------+-----------+----------+--------------+ FV DistalFull                                                        +---------+---------------+---------+-----------+----------+--------------+ PFV      Full                                                        +---------+---------------+---------+-----------+----------+--------------+ POP      Full           Yes      Yes                                 +---------+---------------+---------+-----------+----------+--------------+  PTV      Full                                                        +---------+---------------+---------+-----------+----------+--------------+ PERO     Full                                                        +---------+---------------+---------+-----------+----------+--------------+     Summary: RIGHT: - There is no evidence of deep vein thrombosis in the lower extremity. However, portions of this examination were limited- see technologist comments above.  - Cystic structure found in the popliteal fossa.  LEFT: - There is no evidence of deep vein thrombosis in the lower extremity.  - No cystic structure found in the popliteal fossa.  *See table(s) above for measurements and observations. Electronically signed by Orlie Pollen on 12/19/2020 at 10:36:03 AM.    Final      Assessment and Plan:   # New onset Afib with RVR  -Most likely driven by severe sepsis  -Moreover, her T4 was slightly high and she is on levothyroxine. Will consider stopping that or touching base with endocrinology -Will not recommend BB or CCB in the setting of severe sepsis and soft blood pressures -Adequate control of pain, anxiety and sepsis (main driver) should bring HR down. -If HR jumps very high (>150), then can consider amiodarone pushes. She is very thrombocytopenic and is high risk of bleeding hence cant be on AC per primary.  -She had a recent Echo which  showed diastolic HF.    For questions or updates, please contact [place] Please consult [email or web address] for contact info under    Signed, Jaci Lazier, MD  12/19/2020 6:56 PM

## 2020-12-19 NOTE — ED Notes (Signed)
RN aware of pt BP 

## 2020-12-19 NOTE — ED Notes (Signed)
Pt's SpO2 has maintained WNL on 2 L O2 via [place], Dr. Arlean Hopping was made aware of peripheral cyanosis in finger tips and delayed cap refill.

## 2020-12-19 NOTE — Progress Notes (Signed)
Patient noted to have rapid HR in 150's. Patient denies chest pain at this time. Dr. Jacqulyn Bath notified new orders.

## 2020-12-19 NOTE — Significant Event (Signed)
Rapid Response Event Note   Reason for Call :  Atrial fibrillation with RVR and Dyspnea  Initial Focused Assessment:  Patient went into afib rvr after receiving a breathing treatment when she got up to the unit. Her rates have been 130-160. MD ordered 15 mg of Dilt, but her rates are still elevated in the 140's. The patient has an elevated d-dimer with a VQ scan that is pending. Per MD's note, the patient is not a candidate to start anticoagulation because of her low platelet count.  The patient is having dyspnea. She is using accessory muscles but, is not hunched over in a tripod position. Her lung sounds are nonremarkable but there was a little wheeze. She is tachypnic and is having trouble voicing small sentences because of her dyspnea. O2 saturation remains high despite her breathing pattern. I would suspect that there is an anxiety component to this as well.    Interventions:  Emotional support given to this patient  Plan of Care:  The patient will remain on 5W. MD discussed her case with a Cards fellow who will see her.    Event Summary:   MD Notified: Dr. Jacqulyn Bath Call Time: 1800 Arrival Time: 1802 End Time: 1900  Andrey Spearman, RN

## 2020-12-19 NOTE — Evaluation (Signed)
Physical Therapy Evaluation Patient Details Name: Jillian Leblanc MRN: [id number] DOB: 1945-11-29 Today's Date: 12/19/2020  History of Present Illness  pt is a 75 y/o female admitted 11/25 with severe sepsis with c/o cough.  Found to have a UTI and possible L5-S1 abscess/osteo.  PMHx:  DM, chole, pacemaker implanted.  Clinical Impression  Pt admitted with/for sepsis due to UTI.  Pt is very weak, sore and needing moderate or more assist for mobility.  Likely 2 person assist for ambulation..  Pt currently limited functionally due to the problems listed. ( See problems list.)   Pt will benefit from PT to maximize function and safety in order to get ready for next venue listed below.        Recommendations for follow up therapy are one component of a multi-disciplinary discharge planning process, led by the attending physician.  Recommendations may be updated based on patient status, additional functional criteria and insurance authorization.  Follow Up Recommendations Skilled nursing-short term rehab (<3 hours/day)    Assistance Recommended at Discharge Frequent or constant Supervision/Assistance  Functional Status Assessment Patient has had a recent decline in their functional status and demonstrates the ability to make significant improvements in function in a reasonable and predictable amount of time.  Equipment Recommendations  None recommended by PT    Recommendations for Other Services       Precautions / Restrictions Precautions Precautions: Fall;Back      Mobility  Bed Mobility   Bed Mobility: Supine to Sit;Sit to Supine     Supine to sit: Mod assist Sit to supine: Mod assist   General bed mobility comments: extremities and trunk very stiff, needed significant trunk and LE assist up and down from supine.    Transfers Overall transfer level: Needs assistance   Transfers: Sit to/from Stand Sit to Stand: Mod assist;Max assist           General transfer comment: pt  unable to optimally use UE's.  completed a face to face tranfer sit to stand with mod/light maximal assist    Ambulation/Gait               General Gait Details: NT  Stairs            Wheelchair Mobility    Modified Rankin (Stroke Patients Only)       Balance   Sitting-balance support: Feet unsupported;Single extremity supported;No upper extremity supported Sitting balance-Leahy Scale: Fair       Standing balance-Leahy Scale: Poor Standing balance comment: reliant on external support today                             Pertinent Vitals/Pain Pain Assessment: Faces Faces Pain Scale: Hurts little more Pain Location: back, generalized Pain Descriptors / Indicators: Sore Pain Intervention(s): Monitored during session;Limited activity within patient's tolerance    Home Living Family/patient expects to be discharged to:: Private residence Living Arrangements: Spouse/significant other Available Help at Discharge: Family Type of Home: House Home Access: Stairs to enter Entrance Stairs-Rails: Right Entrance Stairs-Number of Steps: 2   Home Layout: Two level;Full bath on main level;Able to live on main level with bedroom/bathroom Home Equipment: Rolling Walker (2 wheels);Cane - quad;Grab bars - toilet;Grab bars - tub/shower;Wheelchair - manual;BSC/3in1;Shower seat      Prior Function Prior Level of Function : History of Falls (last six months);Needs assist               ADLs Comments:  reports typically able to complete ADLs though increasing difficulty due to falls; assist with IADLs     Hand Dominance   Dominant Hand: Right    Extremity/Trunk Assessment   Upper Extremity Assessment Upper Extremity Assessment: Generalized weakness    Lower Extremity Assessment Lower Extremity Assessment: Generalized weakness    Cervical / Trunk Assessment Cervical / Trunk Assessment: Kyphotic  Communication   Communication: No difficulties   Cognition Arousal/Alertness: Awake/alert Behavior During Therapy: WFL for tasks assessed/performed Overall Cognitive Status: No family/caregiver present to determine baseline cognitive functioning (Impaired attention, safety awareness)                                          General Comments General comments (skin integrity, edema, etc.): vss on oxygen, Sats at 100%, HR in the 90's/100's.  Pt coughing and congested, feeling horrible.    Exercises     Assessment/Plan    PT Assessment Patient needs continued PT services  PT Problem List Decreased strength;Decreased activity tolerance;Decreased mobility;Cardiopulmonary status limiting activity;Decreased range of motion;Decreased balance;Pain       PT Treatment Interventions DME instruction;Gait training;Functional mobility training;Therapeutic activities;Therapeutic exercise;Stair training;Balance training;Patient/family education    PT Goals (Current goals can be found in the Care Plan section)  Acute Rehab PT Goals Patient Stated Goal: To feel better PT Goal Formulation: With patient Time For Goal Achievement: 01/02/21 Potential to Achieve Goals: Fair    Frequency Min 3X/week   Barriers to discharge        Co-evaluation               AM-PAC PT "6 Clicks" Mobility  Outcome Measure Help needed turning from your back to your side while in a flat bed without using bedrails?: A Lot Help needed moving from lying on your back to sitting on the side of a flat bed without using bedrails?: A Lot Help needed moving to and from a bed to a chair (including a wheelchair)?: A Lot Help needed standing up from a chair using your arms (e.g., wheelchair or bedside chair)?: A Lot Help needed to walk in hospital room?: A Lot Help needed climbing 3-5 steps with a railing? : Total 6 Click Score: 11    End of Session   Activity Tolerance: Patient limited by fatigue Patient left: in bed;with call bell/phone within  reach Nurse Communication: Mobility status PT Visit Diagnosis: Other abnormalities of gait and mobility (R26.89);Muscle weakness (generalized) (M62.81) Pain - Right/Left:  (back)    Time: [id number] PT Time Calculation (min) (ACUTE ONLY): 24 min   Charges:   PT Evaluation $PT Eval Moderate Complexity: 1 Mod PT Treatments $Therapeutic Activity: 8-22 mins        12/19/2020  Ginger Carne., PT Acute Rehabilitation Services [phone]  (pager) [phone]  (office)  Tessie Fass Cobin Cadavid 12/19/2020, 2:14 PM

## 2020-12-19 NOTE — Progress Notes (Signed)
Lower extremity venous bilateral study completed.   Please see CV Proc for preliminary results.   Shanece Cochrane, RDMS, RVT  

## 2020-12-19 NOTE — Progress Notes (Addendum)
  X-cover Note: Asked by rapid response nurse to come evaluate the patient.  Patient admitted on 12/18/2020 for UTI.  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  Blood cultures grew out E. coli.  Cardiology consulted earlier this evening due to rapid atrial fibrillation.  I was paged to the bedside due to the patient's persistent A. fib.  Nurse has been try to get a hold of cardiology due to the patient's continued tachycardia.  Patient has become hypotensive with systolic blood pressure less than 90 and maps less than 65.  Patient also complaining of severe back pain.  Daughter [name] is at the bedside.  Patient has a history of back pain.  Recent CT scan of the abdomen and pelvis demonstrated a L5 compression fracture now at 50%.  It was at 35% in October.  There is some paravertebral gas near her L5 fracture.  Suspicion for possible osteomyelitis.  Patient's blood cultures returned positive for E. coli.  She is on IV Rocephin.  Patient has a history of diastolic heart failure.  She also has a history of a pacemaker.  Given the patient's hypotension, will start with a fluid bolus of 500 cc.  Recently patient had a bout of acute kidney injury in early November.  When she was discharged from the hospital on November 14, her serum creatinine was down to 1.43.  Today it is 1.98.  Patient may need another fluid bolus.  Patient has been thrombocytopenic since admission and therefore systemic anticoagulation has not been started.  If her heart rate does not improve with IV fluids, will need to consider IV amiodarone and/or transfer to ICU.   Carollee Herter, DO [facility]

## 2020-12-19 NOTE — ED Notes (Signed)
Fentanyl pulled and plastic packaging over the medication syringe was opened but after multiple BP checks with BP remaining in the 90's, dose was not administered. Delorise Shiner RN wasted as witness full syringe of 50 mcg. Dr. Arlean Hopping was made aware of holding the pain medication d/t pt's BP and agreed.

## 2020-12-19 NOTE — Progress Notes (Addendum)
PROGRESS NOTE    Jillian Leblanc  GDJ:[id number] DOB: 07/31/1945 DOA: 12/18/2020 PCP: Guadlupe Spanish, MD   Brief Narrative:  Jillian Leblanc is a 75 y.o. female with medical history significant for chronic back pain, type 2 diabetes mellitus complicated by peripheral polyneuropathy, acquired hypothyroidism, chronic diastolic heart failure, chronic anemia with baseline hemoglobin 9-11.5, who is admitted to [facility] on 12/18/2020 with severe sepsis after presenting from home to [facility] ED complaining of cough.   Assessment & Plan:   Principal Problem:   Severe sepsis ([place]) Active Problems:   AKI (acute kidney injury) ([place])   Type 2 diabetes mellitus with hypoglycemia without coma (HCC)   Hypothyroidism   Acute metabolic encephalopathy   Acute hyponatremia   Thrombocytopenia (HCC)   Chronic anemia   Chronic diastolic CHF (congestive heart failure) (HCC)  Severe sepsis secondary to UTI and possible L5-S1 abscess/osteomyelitis?/Gram-negative bacteremia POA:  Patient met sever sepsis criteria based on temperature 101.3, tachycardia, tachypnea, lactic acid of 2.1, acute kidney injury and acute encephalopathy.  Last fever at around 10:30 PM on 12/18/2020.  Patient appears to be uncomfortable.  Complaining of acute on chronic low back pain and right flank pain.  UA indicates UTI.  Out of concern of possible pyelonephritis due to her complaint of right flank pain, CT abdomen and pelvis was done which ruled out pyelonephritis but confirms cystitis.  CT abdomen and pelvis also is concerning for possible osteomyelitis or abscess around L5-S1.  Due to patient having pacer, unfortunately we cannot do MRI and due to her having AKI, CT with contrast can also not be completed.  In the meantime, we will proceed with CT lumbar spine without contrast after discussing with radiology.  Her blood culture 2 out of 3 sets is also growing gram-negative rods.  Procalcitonin 16.24.  We will narrow over  antibiotics to Rocephin 2 g daily.  Chest x-ray is negative for any infiltrates.  Urine antigen for streptococci negative.  We will check urine antigen for Legionella as well as respiratory viral panel.  Addendum-2:20 PM: CT lumbar spine without contrast shows possible discitis or osteomyelitis and cannot rule out possible abscess at L5-S1 region.  D-dimer elevated, Doppler lower extremity negative.  VQ scan pending.  I have consulted and discussed case with Dr. Drucilla Schmidt who is going to see this patient and agreed with Rocephin antibiotic.  Per his recommendation, I have also consulted IR to try to aspirate fluid if there is any or get for biopsy of L5-S1 region.  Acute metabolic/toxic encephalopathy: Apparently patient was confused yesterday when she arrived.  Currently she is fully alert and oriented.  Encephalopathy likely secondary to sepsis.  AKI on CKD stage IIIa: It appears that patient's baseline creatinine is around 1.15 with a GFR about 50.  She presented with creatinine of 2.20.  This is improving.  This is likely prerenal secondary to severe sepsis.  We will continue IV fluids and monitor daily.  Acute thrombocytopenia: Likely secondary to severe sepsis.  No signs of bleeding.  Monitor closely.  Hypocalcemia: We will replace with calcium gluconate IV.  Hypomagnesemia: 1.5 yesterday.  No replacement was given.  I will replace now.  Acute hypoxic respiratory failure secondary to acute on chronic diastolic congestive heart failure: Chest x-ray shows pulmonary vascular congestion and she has some bibasilar crackles.  She is hypoxic as well.  She takes Lasix p.o. 40 mg twice daily at home but unfortunately due to acute kidney injury and hypotension, we are unable  to give her any diuretics.  Patient looks uncomfortable with tachypnea, this also raises suspicion for possible PE since patient does not much of a mobile at home due to limping and she also has bilateral calf tenderness on examination  raising concern for DVT.  I have ordered VQ scan as well as Doppler lower extremity bilaterally and D-dimer.  Right hip pain: Patient complains of right hip pain since about a month and she says that she is limping.  We will proceed with x-ray of the right hip.  Acute hypovolemic hyponatremia: Presented with 126 and that is a stable.  Hopefully this will improve with improvement of the sepsis and hypovolemia.  Type 2 diabetes mellitus: Recent hemoglobin A1c 8.4.  Hold home oral medications.  Continue SSI.  Blood sugar controlled here.  Acquired hypothyroidism: TSH elevated at 5.4.  Will check free T4.  Continue Synthroid.  TSH could be elevated in acute sepsis as well.  Anemia of chronic disease: Hemoglobin is stable.  DVT prophylaxis: SCDs Start: 12/18/20 2313   Code Status: Full Code  Family Communication:  None present at bedside.  Plan of care discussed with patient in length and he verbalized understanding and agreed with it.  Addendum: Around 12:15 PM, I received a message from patient's nurse that patient is crying and talking to her daughter over the phone saying that the doctor told her that she was dying and that she is upset and the family is upset as well.  During my visit earlier, as a common practice, I discussed CODE STATUS with the patient especially since she is very sick.  Patient verbalized that she wanted to continue to be full code.  It appears that patient totally misunderstood that conversation.  She did not say anything about dying at that point in time.  I talked to her daughter Jillian Leblanc over the phone and explained all of that.  She was appreciative of the call and will explain this to the patient.  Primary RN also made aware of this communication.  Status is: Inpatient  Remains inpatient appropriate because: Patient very sick needs acute management of many issues.  Estimated body mass index is 37.93 kg/m as calculated from the following:   Height as of 12/04/20: 5'  (1.524 m).   Weight as of 12/08/20: 88.1 kg.  Pressure Injury 12/04/20 Sacrum Medial Stage 2 -  Partial thickness loss of dermis presenting as a shallow open injury with a red, pink wound bed without slough. (Active)  12/04/20 2353  Location: Sacrum  Location Orientation: Medial  Staging: Stage 2 -  Partial thickness loss of dermis presenting as a shallow open injury with a red, pink wound bed without slough.  Wound Description (Comments):   Present on Admission: Yes    Nutritional Assessment: There is no height or weight on file to calculate BMI.. Seen by dietician.  I agree with the assessment and plan as outlined below: Nutrition Status:   Skin Assessment: I have examined the patient's skin and I agree with the wound assessment as performed by the wound care RN as outlined below: Pressure Injury 12/04/20 Sacrum Medial Stage 2 -  Partial thickness loss of dermis presenting as a shallow open injury with a red, pink wound bed without slough. (Active)  12/04/20 2353  Location: Sacrum  Location Orientation: Medial  Staging: Stage 2 -  Partial thickness loss of dermis presenting as a shallow open injury with a red, pink wound bed without slough.  Wound Description (Comments):  Present on Admission: Yes    Consultants:  None  Procedures:  None  Antimicrobials:  Anti-infectives (From admission, onward)    Start     Dose/Rate Route Frequency Ordered Stop   12/19/20 2000  ceFEPIme (MAXIPIME) 2 g in sodium chloride 0.9 % 100 mL IVPB  Status:  Discontinued        2 g 200 mL/hr over 30 Minutes Intravenous Every 24 hours 12/19/20 0057 12/19/20 1023   12/19/20 2000  cefTRIAXone (ROCEPHIN) 2 g in sodium chloride 0.9 % 100 mL IVPB        2 g 200 mL/hr over 30 Minutes Intravenous Every 24 hours 12/19/20 1023     12/19/20 0057  vancomycin variable dose per unstable renal function (pharmacist dosing)  Status:  Discontinued         Does not apply See admin instructions 12/19/20 0057  12/19/20 1023   12/18/20 2000  ceFEPIme (MAXIPIME) 2 g in sodium chloride 0.9 % 100 mL IVPB        2 g 200 mL/hr over 30 Minutes Intravenous  Once 12/18/20 1955 12/18/20 2050   12/18/20 2000  metroNIDAZOLE (FLAGYL) IVPB 500 mg        500 mg 100 mL/hr over 60 Minutes Intravenous  Once 12/18/20 1955 12/18/20 2210   12/18/20 2000  vancomycin (VANCOREADY) IVPB 1750 mg/350 mL        1,750 mg 175 mL/hr over 120 Minutes Intravenous  Once 12/18/20 1955 12/19/20 0045          Subjective: Patient seen and examined.  She complains of shortness of breath, acute on chronic low back pain, right flank pain.  Increased frequency of urination but no burning urination.  No abdominal pain.  No other complaint.  She looks slightly uncomfortable.  Denies any chest pain.  Objective: Vitals:   12/19/20 0700 12/19/20 0730 12/19/20 0800 12/19/20 0830  BP: (!) 107/56 (!) 100/47 (!) 102/52 (!) 107/55  Pulse: 95 93 93 88  Resp: '20 20 17 14  ' Temp:      TempSrc:      SpO2: 99% 99% 99% 100%    Intake/Output Summary (Last 24 hours) at 12/19/2020 1138 Last data filed at 12/19/2020 0045 Gross per 24 hour  Intake 1150 ml  Output --  Net 1150 ml   There were no vitals filed for this visit.  Examination:  General exam: Appears uncomfortable and tachypneic. Respiratory system: Bibasilar crackles, faint with some tachypnea and shallow breathing. Cardiovascular system: S1 & S2 heard, RRR. No JVD, murmurs, rubs, gallops or clicks. No pedal edema. Gastrointestinal system: Abdomen is nondistended, soft and nontender. No organomegaly or masses felt. Normal bowel sounds heard. Central nervous system: Alert and oriented. No focal neurological deficits. Extremities: Symmetric 5 x 5 power. Skin: No rashes, lesions or ulcers   Data Reviewed: I have personally reviewed following labs and imaging studies  CBC: Recent Labs  Lab 12/18/20 1949 12/19/20 0208 12/19/20 0237  WBC 5.4  --  4.4  NEUTROABS 4.8  --   4.0  HGB 8.9* 8.5* 8.6*  HCT 27.3* 25.0* 26.5*  MCV 88.6  --  91.4  PLT 69*  --  57*   Basic Metabolic Panel: Recent Labs  Lab 12/18/20 1949 12/19/20 0208 12/19/20 0237  NA 126* 129* 126*  K 4.2 3.8 3.8  CL 94*  --  96*  CO2 19*  --  17*  GLUCOSE 129*  --  99  BUN 37*  --  35*  CREATININE 2.20*  --  1.98*  CALCIUM 7.0*  --  6.8*  MG  --   --  1.5*   GFR: Estimated Creatinine Clearance: 25 mL/min (A) (by C-G formula based on SCr of 1.98 mg/dL (H)). Liver Function Tests: Recent Labs  Lab 12/18/20 1949 12/19/20 0237  AST 43* 39  ALT 29 27  ALKPHOS 217* 172*  BILITOT 1.6* 1.8*  PROT 6.1* 5.7*  ALBUMIN 2.3* 2.2*   No results for input(s): LIPASE, AMYLASE in the last 168 hours. No results for input(s): AMMONIA in the last 168 hours. Coagulation Profile: Recent Labs  Lab 12/18/20 1949  INR 1.2   Cardiac Enzymes: No results for input(s): CKTOTAL, CKMB, CKMBINDEX, TROPONINI in the last 168 hours. BNP (last 3 results) No results for input(s): PROBNP in the last 8760 hours. HbA1C: No results for input(s): HGBA1C in the last 72 hours. CBG: Recent Labs  Lab 12/19/20 0820 12/19/20 0850  GLUCAP 94 104*   Lipid Profile: No results for input(s): CHOL, HDL, LDLCALC, TRIG, CHOLHDL, LDLDIRECT in the last 72 hours. Thyroid Function Tests: Recent Labs    12/19/20 0237  TSH 5.442*   Anemia Panel: No results for input(s): VITAMINB12, FOLATE, FERRITIN, TIBC, IRON, RETICCTPCT in the last 72 hours. Sepsis Labs: Recent Labs  Lab 12/18/20 1955 12/18/20 2148 12/19/20 0237  PROCALCITON  --   --  16.24  LATICACIDVEN 2.1* 1.8  --     Recent Results (from the past 240 hour(s))  Blood Culture (routine x 2)     Status: None (Preliminary result)   Collection Time: 12/18/20  7:49 PM   Specimen: BLOOD  Result Value Ref Range Status   Specimen Description BLOOD RIGHT ANTECUBITAL  Final   Special Requests   Final    BOTTLES DRAWN AEROBIC AND ANAEROBIC Blood Culture results  may not be optimal due to an excessive volume of blood received in culture bottles   Culture  Setup Time   Final    GRAM NEGATIVE RODS ANAEROBIC BOTTLE ONLY CRITICAL RESULT CALLED TO, READ BACK BY AND VERIFIED WITH: J,MILLEN PHARMD '@[id number]'  12/19/20 EB Performed at [facility], [place] [address] [place],  [address]    Culture GRAM NEGATIVE RODS  Final   Report Status PENDING  Incomplete  Blood Culture ID Panel (Reflexed)     Status: Abnormal   Collection Time: 12/18/20  7:49 PM  Result Value Ref Range Status   Enterococcus faecalis NOT DETECTED NOT DETECTED Final   Enterococcus Faecium NOT DETECTED NOT DETECTED Final   Listeria monocytogenes NOT DETECTED NOT DETECTED Final   Staphylococcus species NOT DETECTED NOT DETECTED Final   Staphylococcus aureus (BCID) NOT DETECTED NOT DETECTED Final   Staphylococcus epidermidis NOT DETECTED NOT DETECTED Final   Staphylococcus lugdunensis NOT DETECTED NOT DETECTED Final   Streptococcus species NOT DETECTED NOT DETECTED Final   Streptococcus agalactiae NOT DETECTED NOT DETECTED Final   Streptococcus pneumoniae NOT DETECTED NOT DETECTED Final   Streptococcus pyogenes NOT DETECTED NOT DETECTED Final   A.calcoaceticus-baumannii NOT DETECTED NOT DETECTED Final   Bacteroides fragilis NOT DETECTED NOT DETECTED Final   Enterobacterales DETECTED (A) NOT DETECTED Final    Comment: Enterobacterales represent a large order of gram negative bacteria, not a single organism. CRITICAL RESULT CALLED TO, READ BACK BY AND VERIFIED WITH: J,MILLEN PHARMD '@[id number]'  12/19/20 EB    Enterobacter cloacae complex NOT DETECTED NOT DETECTED Final   Escherichia coli DETECTED (A) NOT DETECTED Final    Comment: CRITICAL RESULT  CALLED TO, READ BACK BY AND VERIFIED WITH: J,MILLEN PHARMD '@[id number]'  12/19/20 EB    Klebsiella aerogenes NOT DETECTED NOT DETECTED Final   Klebsiella oxytoca NOT DETECTED NOT DETECTED Final   Klebsiella pneumoniae NOT DETECTED NOT DETECTED Final    Proteus species NOT DETECTED NOT DETECTED Final   Salmonella species NOT DETECTED NOT DETECTED Final   Serratia marcescens NOT DETECTED NOT DETECTED Final   Haemophilus influenzae NOT DETECTED NOT DETECTED Final   Neisseria meningitidis NOT DETECTED NOT DETECTED Final   Pseudomonas aeruginosa NOT DETECTED NOT DETECTED Final   Stenotrophomonas maltophilia NOT DETECTED NOT DETECTED Final   Candida albicans NOT DETECTED NOT DETECTED Final   Candida auris NOT DETECTED NOT DETECTED Final   Candida glabrata NOT DETECTED NOT DETECTED Final   Candida krusei NOT DETECTED NOT DETECTED Final   Candida parapsilosis NOT DETECTED NOT DETECTED Final   Candida tropicalis NOT DETECTED NOT DETECTED Final   Cryptococcus neoformans/gattii NOT DETECTED NOT DETECTED Final   CTX-M ESBL NOT DETECTED NOT DETECTED Final   Carbapenem resistance IMP NOT DETECTED NOT DETECTED Final   Carbapenem resistance KPC NOT DETECTED NOT DETECTED Final   Carbapenem resistance NDM NOT DETECTED NOT DETECTED Final   Carbapenem resist OXA 48 LIKE NOT DETECTED NOT DETECTED Final   Carbapenem resistance VIM NOT DETECTED NOT DETECTED Final    Comment: Performed at [facility], [address] [place], [place] [address]  Blood Culture (routine x 2)     Status: None (Preliminary result)   Collection Time: 12/18/20  8:10 PM   Specimen: BLOOD  Result Value Ref Range Status   Specimen Description BLOOD SITE NOT SPECIFIED  Final   Special Requests AEROBIC BOTTLE ONLY Blood Culture adequate volume  Final   Culture  Setup Time   Final    GRAM NEGATIVE RODS AEROBIC BOTTLE ONLY CRITICAL VALUE NOTED.  VALUE IS CONSISTENT WITH PREVIOUSLY REPORTED AND CALLED VALUE. Performed at [facility], [place] [address] [place], [place] [address]    Culture GRAM NEGATIVE RODS  Final   Report Status PENDING  Incomplete  Resp Panel by RT-PCR (Flu A&B, Covid) Nasopharyngeal Swab     Status: None   Collection Time: 12/18/20  9:10 PM    Specimen: Nasopharyngeal Swab; Nasopharyngeal(NP) swabs in vial transport medium  Result Value Ref Range Status   SARS Coronavirus 2 by RT PCR NEGATIVE NEGATIVE Final    Comment: (NOTE) SARS-CoV-2 target nucleic acids are NOT DETECTED.  The SARS-CoV-2 RNA is generally detectable in upper respiratory specimens during the acute phase of infection. The lowest concentration of SARS-CoV-2 viral copies this assay can detect is 138 copies/mL. A negative result does not preclude SARS-Cov-2 infection and should not be used as the sole basis for treatment or other patient management decisions. A negative result may occur with  improper specimen collection/handling, submission of specimen other than nasopharyngeal swab, presence of viral mutation(s) within the areas targeted by this assay, and inadequate number of viral copies(<138 copies/mL). A negative result must be combined with clinical observations, patient history, and epidemiological information. The expected result is Negative.  Fact Sheet for Patients:  EntrepreneurPulse.com.au  Fact Sheet for Healthcare Providers:  IncredibleEmployment.be  This test is no t yet approved or cleared by the [place] FDA and  has been authorized for detection and/or diagnosis of SARS-CoV-2 by FDA under an Emergency Use Authorization (EUA). This EUA will remain  in effect (meaning this test can be used) for the duration  of the COVID-19 declaration under Section 564(b)(1) of the Act, 21 U.S.C.section 360bbb-3(b)(1), unless the authorization is terminated  or revoked sooner.       Influenza A by PCR NEGATIVE NEGATIVE Final   Influenza B by PCR NEGATIVE NEGATIVE Final    Comment: (NOTE) The Xpert Xpress SARS-CoV-2/FLU/RSV plus assay is intended as an aid in the diagnosis of influenza from Nasopharyngeal swab specimens and should not be used as a sole basis for treatment. Nasal washings and aspirates are  unacceptable for Xpert Xpress SARS-CoV-2/FLU/RSV testing.  Fact Sheet for Patients: EntrepreneurPulse.com.au  Fact Sheet for Healthcare Providers: IncredibleEmployment.be  This test is not yet approved or cleared by the [place] FDA and has been authorized for detection and/or diagnosis of SARS-CoV-2 by FDA under an Emergency Use Authorization (EUA). This EUA will remain in effect (meaning this test can be used) for the duration of the COVID-19 declaration under Section 564(b)(1) of the Act, 21 U.S.C. section 360bbb-3(b)(1), unless the authorization is terminated or revoked.  Performed at [facility], [place] [address], [place], [place] [address]   MRSA Next Gen by PCR, Nasal     Status: Abnormal   Collection Time: 12/19/20 12:27 AM   Specimen: Nasal Mucosa; Nasal Swab  Result Value Ref Range Status   MRSA by PCR Next Gen DETECTED (A) NOT DETECTED Final    Comment: RESULT CALLED TO, READ BACK BY AND VERIFIED WITH: C MAS,RN'@[id number]'  12/19/20 [place] (NOTE) The GeneXpert MRSA Assay (FDA approved for NASAL specimens only), is one component of a comprehensive MRSA colonization surveillance program. It is not intended to diagnose MRSA infection nor to guide or monitor treatment for MRSA infections. Test performance is not FDA approved in patients less than 43 years old. Performed at [facility], [place] [address], [place], [place] [address]       Radiology Studies: CT ABDOMEN PELVIS WO CONTRAST  Result Date: 12/19/2020 CLINICAL DATA:  75 year old female abdominal and pelvic pain. Sepsis. EXAM: CT ABDOMEN AND PELVIS WITHOUT CONTRAST TECHNIQUE: Multidetector CT imaging of the abdomen and pelvis was performed following the standard protocol without IV contrast. COMPARISON:  11/22/2020 lumbar spine CT 06/08/2018 CT FINDINGS: Please note that parenchymal and vascular abnormalities may be missed as intravenous contrast was not administered. Lower  chest: No acute abnormality. Pacemaker/AICD leads are present. Hepatobiliary: No hepatic abnormalities are identified. The patient is status post cholecystectomy. There is no evidence of intrahepatic or extrahepatic biliary dilatation. 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 There is no evidence gas within the renal collecting system or renal parenchyma. No hydronephrosis is identified. Scattered areas of RIGHT renal scarring are present. The adrenal glands are unremarkable. Stomach/Bowel: There is no evidence of bowel obstruction, definite bowel wall thickening or bowel inflammatory changes. Vascular/Lymphatic: Aortic atherosclerosis. No enlarged abdominal or pelvic lymph nodes. Reproductive: Status post hysterectomy. No adnexal masses. Other: Subcutaneous edema is noted. There is no evidence of ascites, focal collection or pneumoperitoneum. Musculoskeletal: A compression fracture of L5 is again identified, increased from 35% on 11/22/2020 to 50% today, now with irregularity of the L5 vertebral body. There are foci of gas in the L4-S1 paravertebral regions with small amount of gas posterior along the spinal canal at L5-S1. No discrete focal collection is identified on this noncontrast study. Disc protrusion at L5-S1 is again noted. A healing RIGHT L1 transverse process fracture remote T12 compression fracture is present. Sacral stimulator again noted IMPRESSION:  1. Increasing compression fracture and heterogeneity of L5 with new paravertebral gas from L4-S1 since 11/22/2020. Although L5 bony changes and paravertebral gas could be related to healing fracture and gas extension from vacuum disc, this is suspicious for infection/osteomyelitis given patient's sepsis. 2. 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 No evidence of gas within the renal collecting system or renal parenchyma. 3. Aortic  Atherosclerosis (ICD10-I70.0). Critical Value/emergent results were called by telephone at the time of interpretation on 12/19/2020 at 10:58 am to provider [facility] , who verbally acknowledged these results. Electronically Signed   By: Margarette Canada M.D.   On: 12/19/2020 11:00   DG CHEST PORT 1 VIEW  Result Date: 12/19/2020 CLINICAL DATA:  Severe sepsis. EXAM: PORTABLE CHEST 1 VIEW COMPARISON:  12/18/2020. FINDINGS: The heart is enlarged and the pulmonary vasculature is mildly distended. Atherosclerotic calcification of the aorta is noted. Lung volumes are low. No consolidation, effusion, or pneumothorax. A dual lead pacemaker is present over the left chest. The bony structures are stable. IMPRESSION: Cardiomegaly with pulmonary vascular congestion. Electronically Signed   By: Brett Fairy M.D.   On: 12/19/2020 03:09   DG Chest Port 1 View  Result Date: 12/18/2020 CLINICAL DATA:  Initial evaluation for questionable sepsis. EXAM: PORTABLE CHEST 1 VIEW COMPARISON:  Prior radiograph from 12/04/2020. FINDINGS: Dual lead left-sided pacemaker/AICD in place. Mild cardiomegaly, stable. Mediastinal silhouette within normal limits. Aortic atherosclerosis. Lungs are hypoinflated. Mild diffuse pulmonary vascular congestion. No pleural effusion. No consolidative airspace disease or focal infiltrate. No pneumothorax. Right shoulder arthroplasty noted.  No acute osseous finding. IMPRESSION: 1. Cardiomegaly with mild diffuse pulmonary vascular congestion. 2. No other active cardiopulmonary disease. 3.  Aortic Atherosclerosis (ICD10-I70.0). Electronically Signed   By: Jeannine Boga M.D.   On: 12/18/2020 20:38   DG HIP UNILAT WITH PELVIS 2-3 VIEWS RIGHT  Result Date: 12/19/2020 CLINICAL DATA:  Right hip and back pain after multiple falls. EXAM: DG HIP (WITH OR WITHOUT PELVIS) 2-3V RIGHT COMPARISON:  12/04/2020. FINDINGS: No fracture or bone lesion. Hip joints, SI joints and symphysis pubis are normally spaced  and aligned. Left lower quadrant stimulator device and right mid pelvic lead are unchanged. Soft tissues otherwise unremarkable. IMPRESSION: 1. No fracture or acute finding. Electronically Signed   By: Lajean Manes M.D.   On: 12/19/2020 11:07   VAS [place] LOWER EXTREMITY VENOUS (DVT)  Result Date: 12/19/2020  Lower Venous DVT Study Patient Name:  Jillian Leblanc  Date of Exam:   12/19/2020 Medical Rec #: [id number]       Accession #:    [id number] Date of Birth: 02/08/1945       Patient Gender: F Patient Age:   42 years Exam Location:  [facility] Procedure:      VAS [place] LOWER EXTREMITY VENOUS (DVT) Referring Phys: Treylan Mcclintock --------------------------------------------------------------------------------  Indications: Edema.  Risk Factors: CHF. Limitations: Body habitus and patient intolerance to compression at some segments. Comparison Study: No prior studies. Performing Technologist: Darlin Coco RDMS, RVT  Examination Guidelines: A complete evaluation includes B-mode imaging, spectral Doppler, color Doppler, and power Doppler as needed of all accessible portions of each vessel. Bilateral testing is considered an integral part of a complete examination. Limited examinations for reoccurring indications may be performed as noted. The reflux portion of the exam is performed with the patient in reverse Trendelenburg.  +---------+---------------+---------+-----------+----------+-------------------+ RIGHT    CompressibilityPhasicitySpontaneityPropertiesThrombus Aging      +---------+---------------+---------+-----------+----------+-------------------+ CFV  Yes      Yes                  Unable to compress                                                        due to patient                                                            discomfort          +---------+---------------+---------+-----------+----------+-------------------+ SFJ      Full                                                              +---------+---------------+---------+-----------+----------+-------------------+ FV Prox  Full                                                             +---------+---------------+---------+-----------+----------+-------------------+ FV Mid   Full                                                             +---------+---------------+---------+-----------+----------+-------------------+ FV DistalFull                                                             +---------+---------------+---------+-----------+----------+-------------------+ PFV      Full                                                             +---------+---------------+---------+-----------+----------+-------------------+ POP      Full           Yes      Yes                                      +---------+---------------+---------+-----------+----------+-------------------+ PTV      Full                                                             +---------+---------------+---------+-----------+----------+-------------------+  PERO     Full                                                             +---------+---------------+---------+-----------+----------+-------------------+   +---------+---------------+---------+-----------+----------+--------------+ LEFT     CompressibilityPhasicitySpontaneityPropertiesThrombus Aging +---------+---------------+---------+-----------+----------+--------------+ CFV      Full           Yes      Yes                                 +---------+---------------+---------+-----------+----------+--------------+ SFJ      Full                                                        +---------+---------------+---------+-----------+----------+--------------+ FV Prox  Full                                                        +---------+---------------+---------+-----------+----------+--------------+ FV  Mid   Full                                                        +---------+---------------+---------+-----------+----------+--------------+ FV DistalFull                                                        +---------+---------------+---------+-----------+----------+--------------+ PFV      Full                                                        +---------+---------------+---------+-----------+----------+--------------+ POP      Full           Yes      Yes                                 +---------+---------------+---------+-----------+----------+--------------+ PTV      Full                                                        +---------+---------------+---------+-----------+----------+--------------+ PERO     Full                                                        +---------+---------------+---------+-----------+----------+--------------+  Summary: RIGHT: - There is no evidence of deep vein thrombosis in the lower extremity. However, portions of this examination were limited- see technologist comments above.  - Cystic structure found in the popliteal fossa.  LEFT: - There is no evidence of deep vein thrombosis in the lower extremity.  - No cystic structure found in the popliteal fossa.  *See table(s) above for measurements and observations. Electronically signed by Orlie Pollen on 12/19/2020 at 10:36:03 AM.    Final     Scheduled Meds:  acetaminophen  650 mg Oral Once   calcium carbonate  800 mg of elemental calcium Oral BID   [START ON 12/20/2020] calcium-vitamin D  0.5 tablet Oral BID   furosemide  40 mg Intravenous BID   insulin aspart  0-6 Units Subcutaneous TID WC   levothyroxine  150 mcg Oral Daily   pramipexole  1.5 mg Oral TID   Continuous Infusions:  cefTRIAXone (ROCEPHIN)  IV     lactated ringers 100 mL/hr at 12/19/20 0352     LOS: 1 day   Time spent: 43 minutes   Darliss Cheney, MD [facility]  12/19/2020, 11:38 AM   Please page via [facility] and do not message via secure chat for anything urgent. Secure chat can be used for anything non urgent.  How to contact the [facility] Attending or Consulting provider [place] or covering provider during after hours [place], for this patient?  Check the care team in [facility] and look for a) attending/consulting [facility] provider listed and b) the [facility] team listed. Page or secure chat [phone]. Log into [email or web address] and use [place]'s universal password to access. If you do not have the password, please contact the hospital operator. Locate the [facility] provider you are looking for under [facility] and page to a number that you can be directly reached. If you still have difficulty reaching the provider, please page the [facility] (Director on Call) for the Hospitalists listed on amion for assistance.

## 2020-12-19 NOTE — Progress Notes (Signed)
Pharmacy Antibiotic Note  Jillian Leblanc is a 75 y.o. female admitted on 12/18/2020 with  E coli bacteremia (likely source UTI), worsening with appropriate therapy .  Pharmacy has been consulted for Meropenem dosing.  Plan: Meropenem 1gm IV q 12h Will f/u renal function, micro data, and pt's clinical condition    Temp (24hrs), Avg:100.2 F (37.9 C), Min:97.5 F (36.4 C), Max:102.3 F (39.1 C)  Recent Labs  Lab 12/18/20 1949 12/18/20 1955 12/18/20 2148 12/19/20 0237  WBC 5.4  --   --  4.4  CREATININE 2.20*  --   --  1.98*  LATICACIDVEN  --  2.1* 1.8  --     Estimated Creatinine Clearance: 25 mL/min (A) (by C-G formula based on SCr of 1.98 mg/dL (H)).    Allergies  Allergen Reactions   Pentosan Polysulfate Sodium     Other reaction(s): Other (See Comments) ELMIRON Y Drug hair fell out 10/21/2009 12:00:00 AM by Johny Blamer CNA 1   Elmiron [Pentosan Polysulfate]    Naloxone Other (See Comments)    Hallucinations Confusion Nightmares    Antimicrobials this admission: Vanc 11/25 x 1 Cefepime 11/25 x1  Flagyl x1 11/25 Ceftriaxone 11/26 >11/27 Meropenem 11/27 >>   Microbiology results: 11/25 UCx - 11/25 BCx - GNR - BCID Ecoli 2 out of 3 11/25 MRSA PCR - positive  Thank you for allowing pharmacy to be a part of this patient's care.  Christoper Fabian, PharmD, BCPS Please see amion for complete clinical pharmacist phone list 12/19/2020 11:58 PM

## 2020-12-19 NOTE — Progress Notes (Addendum)
   12/19/20 1729  Assess: MEWS Score  Temp 98.1 F (36.7 C)  BP (!) 112/98  Pulse Rate (!) 156  ECG Heart Rate (!) 153  Resp (!) 30  Level of Consciousness Alert  SpO2 92 %  O2 Device Nasal Cannula  O2 Flow Rate (L/min) 2 L/min  Assess: MEWS Score  MEWS Temp 0  MEWS Systolic 0  MEWS Pulse 3  MEWS RR 2  MEWS LOC 0  MEWS Score 5  MEWS Score Color Red  Assess: if the MEWS score is Yellow or Red  Were vital signs taken at a resting state? Yes  Focused Assessment Change from prior assessment (see assessment flowsheet)  Early Detection of Sepsis Score *See Row Information* Medium  MEWS guidelines implemented *See Row Information* Yes  Treat  MEWS Interventions Administered scheduled meds/treatments  Pain Scale 0-10  Pain Score 8  Pain Type Acute pain  Pain Location Back  Pain Orientation Lower  Pain Descriptors / Indicators Aching  Pain Intervention(s) Repositioned  Take Vital Signs  Increase Vital Sign Frequency  Red: Q 1hr X 4 then Q 4hr X 4, if remains red, continue Q 4hrs  Escalate  MEWS: Escalate Red: discuss with charge nurse/RN and provider, consider discussing with RRT  Notify: Charge Nurse/RN  Name of Charge Nurse/RN Notified Maralyn Sago  Date Charge Nurse/RN Notified 12/19/20  Time Charge Nurse/RN Notified 1729  Notify: Provider  Provider Name/Title Dr. Jacqulyn Bath  Date Provider Notified 12/19/20  Time Provider Notified 1720 (notified for new onset Afib with rapid rate)  Notification Type Page  Notification Reason Change in status  Provider response No new orders;Other (Comment) (consulted Cardiology)  Date of Provider Response 12/19/20  Time of Provider Response 1730  Notify: Rapid Response  Name of Rapid Response RN Notified Luisa Hart  Date Rapid Response Notified 12/19/20  Time Rapid Response Notified 1730  Document  Patient Outcome Not stable and remains on department;Other (Comment) (Heart rate decreased slightly after IV cardizem)

## 2020-12-19 NOTE — Progress Notes (Signed)
Pharmacy Antibiotic Note  Jillian Leblanc is a 75 y.o. female admitted on 12/18/2020 with sepsis.  Pharmacy has been consulted for vancomycin and cefepime dosing.  Pt w/ AKI; baseline SCr <1, now 2.2.  Plan: Vancomycin 1750mg  IV x1 in ED; will monitor SCr +/- vanc levels prior to redosing. Cefepime 2g IV Q24H.   Temp (24hrs), Avg:100.7 F (38.2 C), Min:100 F (37.8 C), Max:101.3 F (38.5 C)  Recent Labs  Lab 12/18/20 1949 12/18/20 1955 12/18/20 2148  WBC 5.4  --   --   CREATININE 2.20*  --   --   LATICACIDVEN  --  2.1* 1.8    Estimated Creatinine Clearance: 22.5 mL/min (A) (by C-G formula based on SCr of 2.2 mg/dL (H)).    Allergies  Allergen Reactions   Pentosan Polysulfate Sodium     Other reaction(s): Other (See Comments) ELMIRON Y Drug hair fell out 10/21/2009 12:00:00 AM by 10/23/2009 CNA 1   Elmiron [Pentosan Polysulfate]    Naloxone Other (See Comments)    Hallucinations Confusion Nightmares    Thank you for allowing pharmacy to be a part of this patient's care.  Johny Blamer, PharmD, BCPS  12/19/2020 12:58 AM

## 2020-12-20 ENCOUNTER — Encounter (HOSPITAL_COMMUNITY): Payer: Self-pay | Admitting: Internal Medicine

## 2020-12-20 ENCOUNTER — Other Ambulatory Visit: Payer: Self-pay

## 2020-12-20 DIAGNOSIS — M4620 Osteomyelitis of vertebra, site unspecified: Secondary | ICD-10-CM

## 2020-12-20 DIAGNOSIS — E871 Hypo-osmolality and hyponatremia: Secondary | ICD-10-CM | POA: Diagnosis not present

## 2020-12-20 DIAGNOSIS — R7881 Bacteremia: Secondary | ICD-10-CM | POA: Diagnosis not present

## 2020-12-20 DIAGNOSIS — T827XXA Infection and inflammatory reaction due to other cardiac and vascular devices, implants and grafts, initial encounter: Secondary | ICD-10-CM

## 2020-12-20 DIAGNOSIS — G9341 Metabolic encephalopathy: Secondary | ICD-10-CM | POA: Diagnosis not present

## 2020-12-20 DIAGNOSIS — A419 Sepsis, unspecified organism: Secondary | ICD-10-CM | POA: Diagnosis not present

## 2020-12-20 LAB — COMPREHENSIVE METABOLIC PANEL
ALT: 23 U/L (ref 0–44)
ALT: 23 U/L (ref 0–44)
AST: 29 U/L (ref 15–41)
AST: 30 U/L (ref 15–41)
Albumin: 1.8 g/dL — ABNORMAL LOW (ref 3.5–5.0)
Albumin: 1.9 g/dL — ABNORMAL LOW (ref 3.5–5.0)
Alkaline Phosphatase: 208 U/L — ABNORMAL HIGH (ref 38–126)
Alkaline Phosphatase: 208 U/L — ABNORMAL HIGH (ref 38–126)
Anion gap: 11 (ref 5–15)
Anion gap: 13 (ref 5–15)
BUN: 25 mg/dL — ABNORMAL HIGH (ref 8–23)
BUN: 28 mg/dL — ABNORMAL HIGH (ref 8–23)
CO2: 18 mmol/L — ABNORMAL LOW (ref 22–32)
CO2: 21 mmol/L — ABNORMAL LOW (ref 22–32)
Calcium: 7.3 mg/dL — ABNORMAL LOW (ref 8.9–10.3)
Calcium: 7.4 mg/dL — ABNORMAL LOW (ref 8.9–10.3)
Chloride: 97 mmol/L — ABNORMAL LOW (ref 98–111)
Chloride: 98 mmol/L (ref 98–111)
Creatinine, Ser: 1.55 mg/dL — ABNORMAL HIGH (ref 0.44–1.00)
Creatinine, Ser: 1.56 mg/dL — ABNORMAL HIGH (ref 0.44–1.00)
GFR, Estimated: 35 mL/min — ABNORMAL LOW (ref >60)
GFR, Estimated: 35 mL/min — ABNORMAL LOW (ref >60)
Glucose, Bld: 78 mg/dL (ref 70–99)
Glucose, Bld: 85 mg/dL (ref 70–99)
Potassium: 3.4 mmol/L — ABNORMAL LOW (ref 3.5–5.1)
Potassium: 3.6 mmol/L (ref 3.5–5.1)
Sodium: 129 mmol/L — ABNORMAL LOW (ref 135–145)
Sodium: 129 mmol/L — ABNORMAL LOW (ref 135–145)
Total Bilirubin: 1.5 mg/dL — ABNORMAL HIGH (ref 0.3–1.2)
Total Bilirubin: 1.7 mg/dL — ABNORMAL HIGH (ref 0.3–1.2)
Total Protein: 5.3 g/dL — ABNORMAL LOW (ref 6.5–8.1)
Total Protein: 5.5 g/dL — ABNORMAL LOW (ref 6.5–8.1)

## 2020-12-20 LAB — GLUCOSE, CAPILLARY
Glucose-Capillary: 82 mg/dL (ref 70–99)
Glucose-Capillary: 97 mg/dL (ref 70–99)
Glucose-Capillary: 76 mg/dL (ref 70–99)
Glucose-Capillary: 77 mg/dL (ref 70–99)

## 2020-12-20 LAB — MAGNESIUM: Magnesium: 1.7 mg/dL (ref 1.7–2.4)

## 2020-12-20 LAB — C-REACTIVE PROTEIN: CRP: 30.6 mg/dL — ABNORMAL HIGH (ref <1.0)

## 2020-12-20 LAB — CBC WITH DIFFERENTIAL/PLATELET
Abs Immature Granulocytes: 0 10*3/uL (ref 0.00–0.07)
Basophils Absolute: 0 10*3/uL (ref 0.0–0.1)
Basophils Relative: 0 %
Eosinophils Absolute: 0 10*3/uL (ref 0.0–0.5)
Eosinophils Relative: 1 %
HCT: 24.2 % — ABNORMAL LOW (ref 36.0–46.0)
Hemoglobin: 8.1 g/dL — ABNORMAL LOW (ref 12.0–15.0)
Lymphocytes Relative: 4 %
Lymphs Abs: 0.2 10*3/uL — ABNORMAL LOW (ref 0.7–4.0)
MCH: 28.6 pg (ref 26.0–34.0)
MCHC: 33.5 g/dL (ref 30.0–36.0)
MCV: 85.5 fL (ref 80.0–100.0)
Monocytes Absolute: 0.2 10*3/uL (ref 0.1–1.0)
Monocytes Relative: 5 %
Neutro Abs: 3.7 10*3/uL (ref 1.7–7.7)
Neutrophils Relative %: 90 %
Platelets: 54 10*3/uL — ABNORMAL LOW (ref 150–400)
RBC: 2.83 MIL/uL — ABNORMAL LOW (ref 3.87–5.11)
RDW: 15.2 % (ref 11.5–15.5)
WBC: 4.1 10*3/uL (ref 4.0–10.5)
nRBC: 0 % (ref 0.0–0.2)
nRBC: 0 /100 WBC

## 2020-12-20 LAB — CBC
HCT: 22 % — ABNORMAL LOW (ref 36.0–46.0)
Hemoglobin: 7.6 g/dL — ABNORMAL LOW (ref 12.0–15.0)
MCH: 29.5 pg (ref 26.0–34.0)
MCHC: 34.5 g/dL (ref 30.0–36.0)
MCV: 85.3 fL (ref 80.0–100.0)
Platelets: 48 10*3/uL — ABNORMAL LOW (ref 150–400)
RBC: 2.58 MIL/uL — ABNORMAL LOW (ref 3.87–5.11)
RDW: 15.1 % (ref 11.5–15.5)
WBC: 2.8 10*3/uL — ABNORMAL LOW (ref 4.0–10.5)
nRBC: 0 % (ref 0.0–0.2)

## 2020-12-20 LAB — LACTIC ACID, PLASMA: Lactic Acid, Venous: 1.5 mmol/L (ref 0.5–1.9)

## 2020-12-20 LAB — SEDIMENTATION RATE: Sed Rate: 85 mm/hr — ABNORMAL HIGH (ref 0–22)

## 2020-12-20 MED ORDER — METHOCARBAMOL 500 MG PO TABS
500.0000 mg | ORAL_TABLET | Freq: Three times a day (TID) | ORAL | Status: DC | PRN
  Administered 2020-12-20 – 2021-01-03 (×12): 500 mg via ORAL
  Filled 2020-12-20 (×12): qty 1

## 2020-12-20 MED ORDER — FENTANYL CITRATE PF 50 MCG/ML IJ SOSY
25.0000 ug | PREFILLED_SYRINGE | INTRAMUSCULAR | Status: DC | PRN
  Administered 2020-12-20: 09:00:00 50 ug via INTRAVENOUS
  Administered 2020-12-20: 21:00:00 25 ug via INTRAVENOUS
  Administered 2020-12-20 (×3): 50 ug via INTRAVENOUS
  Administered 2020-12-20: 21:00:00 25 ug via INTRAVENOUS
  Administered 2020-12-21 (×4): 50 ug via INTRAVENOUS
  Filled 2020-12-20 (×9): qty 1

## 2020-12-20 MED ORDER — MEROPENEM 1 G IV SOLR
1.0000 g | Freq: Three times a day (TID) | INTRAVENOUS | Status: DC
  Administered 2020-12-20 (×2): 1 g via INTRAVENOUS
  Filled 2020-12-20 (×2): qty 1

## 2020-12-20 MED ORDER — OXYCODONE HCL 5 MG PO TABS
5.0000 mg | ORAL_TABLET | ORAL | Status: DC | PRN
  Administered 2020-12-20: 21:00:00 5 mg via ORAL
  Administered 2020-12-20 – 2020-12-21 (×4): 10 mg via ORAL
  Filled 2020-12-20 (×2): qty 2
  Filled 2020-12-20: qty 1
  Filled 2020-12-20 (×2): qty 2

## 2020-12-20 MED ORDER — POTASSIUM CHLORIDE CRYS ER 20 MEQ PO TBCR
40.0000 meq | EXTENDED_RELEASE_TABLET | Freq: Once | ORAL | Status: AC
  Administered 2020-12-20: 14:00:00 40 meq via ORAL
  Filled 2020-12-20: qty 2

## 2020-12-20 MED ORDER — SODIUM CHLORIDE 0.9 % IV SOLN
2.0000 g | INTRAVENOUS | Status: DC
  Administered 2020-12-20: 17:00:00 2 g via INTRAVENOUS
  Filled 2020-12-20 (×2): qty 20

## 2020-12-20 MED ORDER — SODIUM CHLORIDE 0.9 % IV BOLUS
1000.0000 mL | Freq: Once | INTRAVENOUS | Status: AC
  Administered 2020-12-20: 05:00:00 1000 mL via INTRAVENOUS

## 2020-12-20 MED ORDER — POLYETHYLENE GLYCOL 3350 17 G PO PACK
17.0000 g | PACK | Freq: Every day | ORAL | Status: DC | PRN
  Administered 2020-12-27 – 2020-12-28 (×2): 17 g via ORAL
  Filled 2020-12-20 (×2): qty 1

## 2020-12-20 MED ORDER — DOCUSATE SODIUM 100 MG PO CAPS
100.0000 mg | ORAL_CAPSULE | Freq: Two times a day (BID) | ORAL | Status: DC | PRN
  Administered 2020-12-27 – 2021-01-06 (×4): 100 mg via ORAL
  Filled 2020-12-20 (×4): qty 1

## 2020-12-20 MED ORDER — SODIUM CHLORIDE 0.9 % IV SOLN
INTRAVENOUS | Status: DC | PRN
  Administered 2020-12-20: 02:00:00 10 mL/h via INTRAVENOUS

## 2020-12-20 MED ORDER — GABAPENTIN 300 MG PO CAPS
300.0000 mg | ORAL_CAPSULE | Freq: Two times a day (BID) | ORAL | Status: DC
  Administered 2020-12-20 – 2021-01-08 (×38): 300 mg via ORAL
  Filled 2020-12-20 (×40): qty 1

## 2020-12-20 NOTE — Progress Notes (Signed)
Spoke with [employer] regarding patient's stimulator. It is a [employer] InterStim implanted 08/11/2017. However, pt has an older lead # [phone] that is conditional to allow Head only scans, T/R coil only, 1.5T only. No other body part, scanner, coils, etc are deemed conditional. Pt will also need their stimulator remote to turn stimulation off or put in MRI mode, whichever is valid for her generator. 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

## 2020-12-20 NOTE — Consult Note (Signed)
Date of Admission:  12/18/2020          Reason for Consult: E. 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 coli Atrial fibrillation with rapid ventricular DIC Acute renal failure Diabetes mellitus History of diastolic heart failure  Plan:  Continue ceftriaxone 2 g IV daily Repeat blood cultures MRI of lumbosacral area if possible Agree with IR plan to aspirate disc area for culture to see if there are any other organisms besides her E. coli that we need to target 2D echocardiogram and she will need a transesophageal echocardiogram to exclude pacemaker infection Continue close neurological monitoring [facility], critical care cardiology and neurosurgery as well as IR are following the patient  Principal Problem:   Gram-negative bacteremia Active Problems:   AKI (acute kidney injury) (HCC)   Type 2 diabetes mellitus with hypoglycemia without coma (HCC)   Hypothyroidism   Severe sepsis (HCC)   Acute metabolic encephalopathy   Acute hyponatremia   Thrombocytopenia (HCC)   Chronic anemia   Chronic diastolic CHF (congestive heart failure) (HCC)   Scheduled Meds:  acetaminophen  650 mg Oral Once   calcium carbonate  800 mg of elemental calcium Oral BID   calcium-vitamin D  0.5 tablet Oral BID   Chlorhexidine Gluconate Cloth  6 each Topical Q0600   gabapentin  300 mg Oral BID   insulin aspart  0-6 Units Subcutaneous TID WC   mouth rinse  15 mL Mouth Rinse BID   mupirocin ointment   1 application Nasal BID   potassium chloride  40 mEq Oral Once   pramipexole  1.5 mg Oral TID   Continuous Infusions:  sodium chloride 10 mL/hr at 12/20/20 0753   amiodarone 30 mg/hr (12/20/20 1200)   cefTRIAXone (ROCEPHIN)  IV     PRN Meds:.sodium chloride, acetaminophen **OR** acetaminophen, docusate sodium, fentaNYL (SUBLIMAZE) injection, hydrOXYzine, ipratropium-albuterol, lip balm, methocarbamol, naLOXone (NARCAN)  injection, oxyCODONE, polyethylene glycol  HPI: Jillian Leblanc is a 75 y.o. female with multiple medical problems including diabetes mellitus with peripheral neuropathy hypothyroidism chronic diastolic heart failure, pacemaker, diabetes mellitus who had a recent hospitalization at [facility] after a fall with acute renal failure and multiple metabolic derangements.  She had had apparently multiple falls and a CT scan done in late October and shown what was thought to be a burst compression fracture at L5 vertebral body.  She also had central right subarticular disc protrusion from L5-S1 at that point in time.  During her recent hospitalization was recommended that she be placed in skilled nursing facility to continue with present physical therapy and rehabilitation but she and family wanted her to go home.  She was ultimately discharged on December 09, 2020.  She was having worsening back pain and was seeking a steroid injection but began having a cough as well as having fevers.  Temperature was as high as 101 degrees.  She also was not eating as much and was developing confusion and somnolence.  She was brought to the emergency department where she was found to be  febrile 101.3 and tachycardic with hypotension.  Labs significant for acute renal failure with BUN of 37 and creatinine of 2.2 CBC with a white count of 5400 worsening thrombocytopenia with platelets at 69 compared to 331 blood cultures were taken and urine analysis and culture was taken CT of the abdomen pelvis was  performed which showed the previously visualized compression fracture but now with adjacent paravertebral gas in the L4-S1 space all concerning for osteomyelitis involving the vertebral spine and paraspinous abscess.  CT it also showed gas in the bladder wall which radiology were concerned represented emphysematous cystitis.  She was started on broad-spectrum antibiotics in the form of vancomycin and cefepime and metronidazole.  The blood cultures are now growing gram-negative rods with BC ID identifying E. coli without ESBL.  Not sure why but for some reason she was switched over to meropenem despite ESBL not being detected.  She has deteriorated acutely with worsening hypotension in the setting of atrial fibrillation with rapid ventricular response and transfer from the floor to the intensive care unit where she is responded to fluid boluses she also been seen by cardiology for atrial fibrillation with rapid ventricular response.  Neurosurgery of also evaluated the patient.  MRI was desired but her pacemaker may not be MRI compatible and she also has a stimulator in her bladder that may also not be compatible with MRI imaging.  I switched her from meropenem back to ceftriaxone this morning.  I am repeating blood cultures and I am ordering a 2D echocardiogram.  I spent 81 minutes with the patient including than 50% of the time in face to face counseling of the patient who is critically ill with E. coli bacteremia vertebral infection multiorgan system failure requiring frequent high complexity decision making assessment support evaluation E coli sepsis,  which included personally reviewing CT of the spine from late October CT of the abdomen pelvis performed on the 26 CBC CMP updated blood cultures urine cultures along with review of medical records in preparation for the visit and during the visit and in coordination of her care with Dr. Leonides Cave and Dr. Tonia Brooms.    Review of Systems: Review of  Systems  Unable to perform ROS: Critical illness   Past Medical History:  Diagnosis Date   Chronic back pain    Diabetes mellitus without complication (HCC)    Interstitial cystitis    Thyroid disease     Social History   Tobacco Use   Smoking status: Never   Smokeless tobacco: Never  Vaping Use   Vaping Use: Never used  Substance Use Topics   Alcohol use: No   Drug use: Never    History reviewed. No pertinent family history. Allergies  Allergen Reactions   Pentosan Polysulfate Sodium     Other reaction(s): Other (See Comments) ELMIRON Y Drug hair fell out 10/21/2009 12:00:00 AM by Johny Blamer CNA 1   Elmiron [Pentosan Polysulfate]    Naloxone Other (See Comments)    Hallucinations Confusion Nightmares    OBJECTIVE: Blood pressure 121/79, pulse (!) 107, temperature 98.5 F (36.9 C), temperature source Oral, resp. rate 15, height 5' (1.524 m), weight 97.3 kg, SpO2 100 %.  Physical Exam Constitutional:      Appearance: She is ill-appearing.  HENT:     Head: Normocephalic and atraumatic.  Eyes:     General:        Right eye: No discharge.        Left eye: No discharge.  Cardiovascular:  Rate and Rhythm: Rhythm irregular.     Heart sounds: No murmur heard.   No friction rub. No gallop.  Pulmonary:     Breath sounds: No stridor. No wheezing or rhonchi.  Chest:     Chest wall: No tenderness.  Abdominal:     General: Abdomen is flat.     Palpations: Abdomen is soft.  Psychiatric:        Attention and Perception: She is inattentive.        Speech: Speech is delayed.        Behavior: Behavior is cooperative.        Cognition and Memory: Cognition is impaired. Memory is impaired. She exhibits impaired recent memory and impaired remote memory.   Patient was fairly somnolent when I examined her.  She did appear to have weaker dorsiflexion and particularly on the left  PM pocket is not overtly infected  Lab Results Lab Results  Component Value Date    WBC 4.1 12/20/2020   HGB 8.1 (L) 12/20/2020   HCT 24.2 (L) 12/20/2020   MCV 85.5 12/20/2020   PLT 54 (L) 12/20/2020    Lab Results  Component Value Date   CREATININE 1.56 (H) 12/20/2020   BUN 25 (H) 12/20/2020   NA 129 (L) 12/20/2020   K 3.6 12/20/2020   CL 98 12/20/2020   CO2 18 (L) 12/20/2020    Lab Results  Component Value Date   ALT 23 12/20/2020   AST 29 12/20/2020   GGT 109 (H) 11/23/2020   ALKPHOS 208 (H) 12/20/2020   BILITOT 1.7 (H) 12/20/2020     Microbiology: Recent Results (from the past 240 hour(s))  Blood Culture (routine x 2)     Status: Abnormal (Preliminary result)   Collection Time: 12/18/20  7:49 PM   Specimen: BLOOD  Result Value Ref Range Status   Specimen Description BLOOD RIGHT ANTECUBITAL  Final   Special Requests   Final    BOTTLES DRAWN AEROBIC AND ANAEROBIC Blood Culture results may not be optimal due to an excessive volume of blood received in culture bottles   Culture  Setup Time   Final    GRAM NEGATIVE RODS IN BOTH AEROBIC AND ANAEROBIC BOTTLES CRITICAL RESULT CALLED TO, READ BACK BY AND VERIFIED WITH: J,MILLEN PHARMD @0958  12/19/20 EB    Culture (A)  Final    ESCHERICHIA COLI SUSCEPTIBILITIES TO FOLLOW Performed at [facility], [address] [place], [place]    Report Status PENDING  Incomplete  Blood Culture ID Panel (Reflexed)     Status: Abnormal   Collection Time: 12/18/20  7:49 PM  Result Value Ref Range Status   Enterococcus faecalis NOT DETECTED NOT DETECTED Final   Enterococcus Faecium NOT DETECTED NOT DETECTED Final   Listeria monocytogenes NOT DETECTED NOT DETECTED Final   Staphylococcus species NOT DETECTED NOT DETECTED Final   Staphylococcus aureus (BCID) NOT DETECTED NOT DETECTED Final   Staphylococcus epidermidis NOT DETECTED NOT DETECTED Final   Staphylococcus lugdunensis NOT DETECTED NOT DETECTED Final   Streptococcus species NOT DETECTED NOT DETECTED Final   Streptococcus agalactiae NOT DETECTED  NOT DETECTED Final   Streptococcus pneumoniae NOT DETECTED NOT DETECTED Final   Streptococcus pyogenes NOT DETECTED NOT DETECTED Final   A.calcoaceticus-baumannii NOT DETECTED NOT DETECTED Final   Bacteroides fragilis NOT DETECTED NOT DETECTED Final   Enterobacterales DETECTED (A) NOT DETECTED Final    Comment: Enterobacterales represent a large order of gram negative bacteria, not a single organism.  CRITICAL RESULT CALLED TO, READ BACK BY AND VERIFIED WITH: J,MILLEN PHARMD @0958  12/19/20 EB    Enterobacter cloacae complex NOT DETECTED NOT DETECTED Final   Escherichia coli DETECTED (A) NOT DETECTED Final    Comment: CRITICAL RESULT CALLED TO, READ BACK BY AND VERIFIED WITH: J,MILLEN PHARMD @0958  12/19/20 EB    Klebsiella aerogenes NOT DETECTED NOT DETECTED Final   Klebsiella oxytoca NOT DETECTED NOT DETECTED Final   Klebsiella pneumoniae NOT DETECTED NOT DETECTED Final   Proteus species NOT DETECTED NOT DETECTED Final   Salmonella species NOT DETECTED NOT DETECTED Final   Serratia marcescens NOT DETECTED NOT DETECTED Final   Haemophilus influenzae NOT DETECTED NOT DETECTED Final   Neisseria meningitidis NOT DETECTED NOT DETECTED Final   Pseudomonas aeruginosa NOT DETECTED NOT DETECTED Final   Stenotrophomonas maltophilia NOT DETECTED NOT DETECTED Final   Candida albicans NOT DETECTED NOT DETECTED Final   Candida auris NOT DETECTED NOT DETECTED Final   Candida glabrata NOT DETECTED NOT DETECTED Final   Candida krusei NOT DETECTED NOT DETECTED Final   Candida parapsilosis NOT DETECTED NOT DETECTED Final   Candida tropicalis NOT DETECTED NOT DETECTED Final   Cryptococcus neoformans/gattii NOT DETECTED NOT DETECTED Final   CTX-M ESBL NOT DETECTED NOT DETECTED Final   Carbapenem resistance IMP NOT DETECTED NOT DETECTED Final   Carbapenem resistance KPC NOT DETECTED NOT DETECTED Final   Carbapenem resistance NDM NOT DETECTED NOT DETECTED Final   Carbapenem resist OXA 48 LIKE NOT  DETECTED NOT DETECTED Final   Carbapenem resistance VIM NOT DETECTED NOT DETECTED Final    Comment: Performed at [facility], [address] [place], [address] [place]  Blood Culture (routine x 2)     Status: None (Preliminary result)   Collection Time: 12/18/20  8:10 PM   Specimen: BLOOD  Result Value Ref Range Status   Specimen Description BLOOD SITE NOT SPECIFIED  Final   Special Requests AEROBIC BOTTLE ONLY Blood Culture adequate volume  Final   Culture  Setup Time   Final    GRAM NEGATIVE RODS AEROBIC BOTTLE ONLY CRITICAL VALUE NOTED.  VALUE IS CONSISTENT WITH PREVIOUSLY REPORTED AND CALLED VALUE.    Culture   Final    GRAM NEGATIVE RODS IDENTIFICATION TO FOLLOW Performed at [facility], [address] [place], [address] [place]    Report Status PENDING  Incomplete  Resp Panel by RT-PCR (Flu A&B, Covid) Nasopharyngeal Swab     Status: None   Collection Time: 12/18/20  9:10 PM   Specimen: Nasopharyngeal Swab; Nasopharyngeal(NP) swabs in vial transport medium  Result Value Ref Range Status   SARS Coronavirus 2 by RT PCR NEGATIVE NEGATIVE Final    Comment: (NOTE) SARS-CoV-2 target nucleic acids are NOT DETECTED.  The SARS-CoV-2 RNA is generally detectable in upper respiratory specimens during the acute phase of infection. The lowest concentration of SARS-CoV-2 viral copies this assay can detect is 138 copies/mL. A negative result does not preclude SARS-Cov-2 infection and should not be used as the sole basis for treatment or other patient management decisions. A negative result may occur with  improper specimen collection/handling, submission of specimen other than nasopharyngeal swab, presence of viral mutation(s) within the areas targeted by this assay, and inadequate number of viral copies(<138 copies/mL). A negative result must be combined with clinical observations, patient history, and epidemiological information. The expected result is Negative.  Fact  Sheet for Patients:  84166  Fact Sheet for Healthcare Providers:  12/20/20  This test is no t yet approved or cleared by the [place] and  has been authorized for detection and/or diagnosis of SARS-CoV-2 by FDA under an Emergency Use Authorization (EUA). This EUA will remain  in effect (meaning this test can be used) for the duration of the COVID-19 declaration under Section 564(b)(1) of the Act, 21 U.S.C.section 360bbb-3(b)(1), unless the authorization is terminated  or revoked sooner.       Influenza A by PCR NEGATIVE NEGATIVE Final   Influenza B by PCR NEGATIVE NEGATIVE Final    Comment: (NOTE) The Xpert Xpress SARS-CoV-2/FLU/RSV plus assay is intended as an aid in the diagnosis of influenza from Nasopharyngeal swab specimens and should not be used as a sole basis for treatment. Nasal washings and aspirates are unacceptable for Xpert Xpress SARS-CoV-2/FLU/RSV testing.  Fact Sheet for Patients: BloggerCourse.com  Fact Sheet for Healthcare Providers: SeriousBroker.it  This test is not yet approved or cleared by the [place] FDA and has been authorized for detection and/or diagnosis of SARS-CoV-2 by FDA under an Emergency Use Authorization (EUA). This EUA will remain in effect (meaning this test can be used) for the duration of the COVID-19 declaration under Section 564(b)(1) of the Act, 21 U.S.C. section 360bbb-3(b)(1), unless the authorization is terminated or revoked.  Performed at [facility], [address], [place], [place] [address]   MRSA Next Gen by PCR, Nasal     Status: Abnormal   Collection Time: 12/19/20 12:27 AM   Specimen: Nasal Mucosa; Nasal Swab  Result Value Ref Range Status   MRSA by PCR Next Gen DETECTED (A) NOT DETECTED Final    Comment: RESULT CALLED TO, READ BACK BY AND VERIFIED WITH: C MAS,RN@[id number]  12/19/20  MK (NOTE) The GeneXpert MRSA Assay (FDA approved for NASAL specimens only), is one component of a comprehensive MRSA colonization surveillance program. It is not intended to diagnose MRSA infection nor to guide or monitor treatment for MRSA infections. Test performance is not FDA approved in patients less than 74 years old. Performed at [facility], [address], [place], [place] [address]   Urine Culture     Status: Abnormal (Preliminary result)   Collection Time: 12/19/20 12:38 AM   Specimen: In/Out Cath Urine  Result Value Ref Range Status   Specimen Description IN/OUT CATH URINE  Final   Special Requests NONE  Final   Culture (A)  Final    1,000 COLONIES/mL ESCHERICHIA COLI SUSCEPTIBILITIES TO FOLLOW Performed at [facility], [address] [place], [place] [address]    Report Status PENDING  Incomplete  Respiratory (~20 pathogens) panel by PCR     Status: None   Collection Time: 12/19/20  9:07 AM   Specimen: Respiratory  Result Value Ref Range Status   Adenovirus NOT DETECTED NOT DETECTED Final   Coronavirus 229E NOT DETECTED NOT DETECTED Final    Comment: (NOTE) The Coronavirus on the Respiratory Panel, DOES NOT test for the novel  Coronavirus (2019 nCoV)    Coronavirus HKU1 NOT DETECTED NOT DETECTED Final   Coronavirus NL63 NOT DETECTED NOT DETECTED Final   Coronavirus OC43 NOT DETECTED NOT DETECTED Final   Metapneumovirus NOT DETECTED NOT DETECTED Final   Rhinovirus / Enterovirus NOT DETECTED NOT DETECTED Final   Influenza A NOT DETECTED NOT DETECTED Final   Influenza B NOT DETECTED NOT DETECTED Final   Parainfluenza Virus 1 NOT DETECTED NOT DETECTED Final   Parainfluenza Virus 2 NOT DETECTED NOT DETECTED Final  Parainfluenza Virus 3 NOT DETECTED NOT DETECTED Final   Parainfluenza Virus 4 NOT DETECTED NOT DETECTED Final   Respiratory Syncytial Virus NOT DETECTED NOT DETECTED Final   Bordetella pertussis NOT DETECTED NOT DETECTED Final    Bordetella Parapertussis NOT DETECTED NOT DETECTED Final   Chlamydophila pneumoniae NOT DETECTED NOT DETECTED Final   Mycoplasma pneumoniae NOT DETECTED NOT DETECTED Final    Comment: Performed at [facility], [address] [place], [place] [address]    Acey Lav, MD [facility] [phone] pager  12/20/2020, 1:28 PM

## 2020-12-20 NOTE — Progress Notes (Signed)
PCCM:  75 yo, back pain, history of bladder stimulator placed by Dr. Logan Bores at [facility] and dual lead pacemaker Medtronic Azure XT DR MRI SureScan w/ Dr. Elberta Fortis. Here with sepsis and GN bactermia. CT L-Spine with:  1. Progressive loss of vertebral body height at the L5 fracture compared with prior CT of 1 month ago. Osseous changes within the L5 vertebral body and upper sacrum are suspicious for developing osteomyelitis and discitis, especially given the paraspinal inflammatory changes and small gas collections. 2. Increased lateral recess and foraminal narrowing bilaterally at L4-5 and L5-S1, in part due to epidural gas. Assessment for epidural fluid collections is limited by noncontrast CT. Consider MRI for further evaluation if possible (patient has a sacral spinal stimulator). If unable to undergo MRI, dedicated postcontrast imaging of the lumbar spine could be performed.  BP (!) 90/54   Pulse (!) 124   Temp 98.9 F (37.2 C) (Oral)   Resp 16   Ht 5' (1.524 m)   Wt 97.3 kg   SpO2 100%   BMI 41.89 kg/m  Gen: comfortable in bed, back pain with movement  HENT: NCAT  Heart: RRR s1 s2  Lungs: CTAB Abd: soft, nt nd  Neuro: moves toes, normal strength BL LE   A: Possible lumbar sacral OM Sepsis  GM Bacteremia   P: I called neurosurgery  MRI w/wo is pending  She has multiple implants, bladder stim and dual chamber pacemaker  We appreciate the help determining MRI compatibility   This patient is critically ill with multiple organ system failure; which, requires frequent high complexity decision making, assessment, support, evaluation, and titration of therapies. This was completed through the application of advanced monitoring technologies and extensive interpretation of multiple databases. During this encounter critical care time was devoted to patient care services described in this note for 36 minutes.  Josephine Igo, DO [facility] 12/20/2020 9:12 AM

## 2020-12-20 NOTE — Progress Notes (Signed)
Rounded on pt during PM rounds BP 88/46, HR AFIB 140s, T 102.3 after tylenol, RR 23, SpO2 98% 2L [place], c/o back pain. Paged TR[facility] to bedside, may need tx to ICU level of care.

## 2020-12-20 NOTE — Progress Notes (Signed)
Subjective: This is a patient that Dr. Venetia Constable saw in consultation in late October for an L5 vertebral body fracture.  She states she had back pain for about 2 years.  This become more severe here recently.  She was readmitted with urosepsis with blood cultures growing out E. coli.  He has a bladder stem and a pacemaker but she cannot get an MRI but a CT scan showed worsening of the L5 vertebral body fracture with gas in the epidural space worrisome for osteomyelitis.  Therefore we were asked to reconsult on the patient.  She has been on merrem, believe that has been switched to Rocephin just now.  He denies leg pain or numbness or tingling or even weakness in the legs.  Its lumbosacral back pain that is severe.  She just received some fentanyl.  She had rapid A. fib and hypotension and was transferred to the ICU.  She is hyponatremic, anemic, and thrombocytopenic  Objective: Vital signs in last 24 hours: Temp:  [97.5 F (36.4 C)-102.3 F (39.1 C)] 98.9 F (37.2 C) (11/27 0737) Pulse Rate:  [42-156] 124 (11/27 0845) Resp:  [15-31] 16 (11/27 0845) BP: (81-170)/(48-159) 90/54 (11/27 0830) SpO2:  [91 %-100 %] 100 % (11/27 0845) Weight:  [97.3 kg] 97.3 kg (11/27 0159)  Intake/Output from previous day: 11/26 0701 - 11/27 0700 In: 3392.1 [P.O.:60; I.V.:1769.4; IV Piggyback:1562.7] Out: 1255 [Urine:1255] Intake/Output this shift: Total I/O In: 264.9 [P.O.:240; I.V.:18.4; IV Piggyback:6.5] Out: 175 [Urine:175]  Exam she is lethargic but arousable.  She is tachycardic.  Blood pressure is okay.  He follows commands with all 4 extremities.  Grips are 5 out of 5.  Negative Hoffmann sign.  She has fairly good strength in her lower extremities to and in bed exam with better than antigravity except for the dorsiflexors that are about 3 out of 5.  Sensation is grossly intact in the legs.  Lab Results: Lab Results  Component Value Date   WBC 4.1 12/20/2020   HGB 8.1 (L) 12/20/2020   HCT 24.2 (L)  12/20/2020   MCV 85.5 12/20/2020   PLT 54 (L) 12/20/2020   Lab Results  Component Value Date   INR 1.2 12/18/2020   BMET Lab Results  Component Value Date   NA 129 (L) 12/20/2020   K 3.6 12/20/2020   CL 98 12/20/2020   CO2 18 (L) 12/20/2020   GLUCOSE 85 12/20/2020   BUN 25 (H) 12/20/2020   CREATININE 1.56 (H) 12/20/2020   CALCIUM 7.4 (L) 12/20/2020    Studies/Results: CT ABDOMEN PELVIS WO CONTRAST  Result Date: 12/19/2020 CLINICAL DATA:  75 year old female abdominal and pelvic pain. Sepsis. EXAM: CT ABDOMEN AND PELVIS WITHOUT CONTRAST TECHNIQUE: Multidetector CT imaging of the abdomen and pelvis was performed following the standard protocol without IV contrast. COMPARISON:  11/22/2020 lumbar spine CT 06/08/2018 CT FINDINGS: Please note that parenchymal and vascular abnormalities may be missed as intravenous contrast was not administered. Lower chest: No acute abnormality. Pacemaker/AICD leads are present. Hepatobiliary: No hepatic abnormalities are identified. The patient is status post cholecystectomy. There is no evidence of intrahepatic or extrahepatic biliary dilatation. 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 There is no evidence gas within the renal collecting system or renal parenchyma. No hydronephrosis is identified. Scattered areas of RIGHT renal scarring are present. The adrenal glands are unremarkable. Stomach/Bowel: There is no evidence of bowel obstruction, definite bowel wall thickening or bowel inflammatory changes. Vascular/Lymphatic: Aortic  atherosclerosis. No enlarged abdominal or pelvic lymph nodes. Reproductive: Status post hysterectomy. No adnexal masses. Other: Subcutaneous edema is noted. There is no evidence of ascites, focal collection or pneumoperitoneum. Musculoskeletal: A compression fracture of L5 is again identified, increased from 35% on 11/22/2020 to 50%  today, now with irregularity of the L5 vertebral body. There are foci of gas in the L4-S1 paravertebral regions with small amount of gas posterior along the spinal canal at L5-S1. No discrete focal collection is identified on this noncontrast study. Disc protrusion at L5-S1 is again noted. A healing RIGHT L1 transverse process fracture remote T12 compression fracture is present. Sacral stimulator again noted IMPRESSION: 1. Increasing compression fracture and heterogeneity of L5 with new paravertebral gas from L4-S1 since 11/22/2020. Although L5 bony changes and paravertebral gas could be related to healing fracture and gas extension from vacuum disc, this is suspicious for infection/osteomyelitis given patient's sepsis. 2. 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 No evidence of gas within the renal collecting system or renal parenchyma. 3. Aortic Atherosclerosis (ICD10-I70.0). Critical Value/emergent results were called by telephone at the time of interpretation on 12/19/2020 at 10:58 am to provider [facility] , who verbally acknowledged these results. Electronically Signed   By: Harmon Pier M.D.   On: 12/19/2020 11:00   CT L-SPINE NO CHARGE  Result Date: 12/19/2020 CLINICAL DATA:  Epidural abscess.  Chronic back pain. EXAM: CT LUMBAR SPINE WITHOUT CONTRAST TECHNIQUE: Multiplanar CT image reconstructions of the lumbar spine were generated from data acquired during abdominopelvic CT of the same date. Abdominopelvic CT findings dictated separately. COMPARISON:  Abdominopelvic CT same date. Lumbar spine CT 11/22/2020. FINDINGS: Segmentation: There are 5 lumbar type vertebral bodies. Alignment: Normal. Vertebrae: Mildly progressive loss of vertebral body height at the L5 burst fracture compared with the CT from last month. There is approximately 40% loss of vertebral body height with progressive irregular lucency and sclerosis in the L5 vertebral body and 1st sacral segment, suspicious for  osteomyelitis. A chronic superior endplate compression deformity at T12 is unchanged. The sacroiliac joints appear unremarkable. There is a right sacral spinal stimulator. Paraspinal and other soft tissues: There is gas and soft tissue stranding within the paraspinal soft tissues at L4 and L5. No focal fluid collections are identified. There is gas within the urinary bladder wall. Additional abdominopelvic findings deferred to separate abdominopelvic CT report. Disc levels: Stable chronic disc and endplate degeneration at T11-12, T12-L1 and L1-2. No significant disc space findings at L2-3 or L3-4. L4-5: Disc height is preserved. Annular disc bulging appears mildly progressive, and there is new gas within the left lateral recess. There is mild facet and ligamentous hypertrophy. Lateral recess and foraminal narrowing appears progressive. No drainable fluid collection identified. L5-S1: Interval decreased gas within the disc with progressive annular disc bulging and a right paracentral disc protrusion. As above, there are small collections of gas within the paraspinal soft tissues as well as the ventral epidural space. Lateral recess and foraminal narrowing appears progressive. No drainable fluid collection identified. IMPRESSION: 1. Progressive loss of vertebral body height at the L5 fracture compared with prior CT of 1 month ago. Osseous changes within the L5 vertebral body and upper sacrum are suspicious for developing osteomyelitis and discitis, especially given the paraspinal inflammatory changes and small gas collections. 2. Increased lateral recess and foraminal narrowing bilaterally at L4-5 and L5-S1, in part due to epidural gas. Assessment for epidural fluid collections is limited by noncontrast CT. Consider MRI for further  evaluation if possible (patient has a sacral spinal stimulator). If unable to undergo MRI, dedicated postcontrast imaging of the lumbar spine could be performed. 3. Abdominopelvic findings  dictated separately. Electronically Signed   By: Richardean Sale M.D.   On: 12/19/2020 12:46   DG CHEST PORT 1 VIEW  Result Date: 12/19/2020 CLINICAL DATA:  Severe sepsis. EXAM: PORTABLE CHEST 1 VIEW COMPARISON:  12/18/2020. FINDINGS: The heart is enlarged and the pulmonary vasculature is mildly distended. Atherosclerotic calcification of the aorta is noted. Lung volumes are low. No consolidation, effusion, or pneumothorax. A dual lead pacemaker is present over the left chest. The bony structures are stable. IMPRESSION: Cardiomegaly with pulmonary vascular congestion. Electronically Signed   By: Brett Fairy M.D.   On: 12/19/2020 03:09   DG Chest Port 1 View  Result Date: 12/18/2020 CLINICAL DATA:  Initial evaluation for questionable sepsis. EXAM: PORTABLE CHEST 1 VIEW COMPARISON:  Prior radiograph from 12/04/2020. FINDINGS: Dual lead left-sided pacemaker/AICD in place. Mild cardiomegaly, stable. Mediastinal silhouette within normal limits. Aortic atherosclerosis. Lungs are hypoinflated. Mild diffuse pulmonary vascular congestion. No pleural effusion. No consolidative airspace disease or focal infiltrate. No pneumothorax. Right shoulder arthroplasty noted.  No acute osseous finding. IMPRESSION: 1. Cardiomegaly with mild diffuse pulmonary vascular congestion. 2. No other active cardiopulmonary disease. 3.  Aortic Atherosclerosis (ICD10-I70.0). Electronically Signed   By: Jeannine Boga M.D.   On: 12/18/2020 20:38   DG HIP UNILAT WITH PELVIS 2-3 VIEWS RIGHT  Result Date: 12/19/2020 CLINICAL DATA:  Right hip and back pain after multiple falls. EXAM: DG HIP (WITH OR WITHOUT PELVIS) 2-3V RIGHT COMPARISON:  12/04/2020. FINDINGS: No fracture or bone lesion. Hip joints, SI joints and symphysis pubis are normally spaced and aligned. Left lower quadrant stimulator device and right mid pelvic lead are unchanged. Soft tissues otherwise unremarkable. IMPRESSION: 1. No fracture or acute finding.  Electronically Signed   By: Lajean Manes M.D.   On: 12/19/2020 11:07   VAS [place] LOWER EXTREMITY VENOUS (DVT)  Result Date: 12/19/2020  Lower Venous DVT Study Patient Name:  Jillian Leblanc  Date of Exam:   12/19/2020 Medical Rec #: [id number]       Accession #:    [id number] Date of Birth: [birth year]       Patient Gender: F Patient Age:   75 years Exam Location:  [facility] Procedure:      VAS [place] LOWER EXTREMITY VENOUS (DVT) Referring Phys: RAVI PAHWANI --------------------------------------------------------------------------------  Indications: Edema.  Risk Factors: CHF. Limitations: Body habitus and patient intolerance to compression at some segments. Comparison Study: No prior studies. Performing Technologist: Darlin Coco RDMS, RVT  Examination Guidelines: A complete evaluation includes B-mode imaging, spectral Doppler, color Doppler, and power Doppler as needed of all accessible portions of each vessel. Bilateral testing is considered an integral part of a complete examination. Limited examinations for reoccurring indications may be performed as noted. The reflux portion of the exam is performed with the patient in reverse Trendelenburg.  +---------+---------------+---------+-----------+----------+-------------------+ RIGHT    CompressibilityPhasicitySpontaneityPropertiesThrombus Aging      +---------+---------------+---------+-----------+----------+-------------------+ CFV                     Yes      Yes                  Unable to compress  due to patient                                                            discomfort          +---------+---------------+---------+-----------+----------+-------------------+ SFJ      Full                                                             +---------+---------------+---------+-----------+----------+-------------------+ FV Prox  Full                                                              +---------+---------------+---------+-----------+----------+-------------------+ FV Mid   Full                                                             +---------+---------------+---------+-----------+----------+-------------------+ FV DistalFull                                                             +---------+---------------+---------+-----------+----------+-------------------+ PFV      Full                                                             +---------+---------------+---------+-----------+----------+-------------------+ POP      Full           Yes      Yes                                      +---------+---------------+---------+-----------+----------+-------------------+ PTV      Full                                                             +---------+---------------+---------+-----------+----------+-------------------+ PERO     Full                                                             +---------+---------------+---------+-----------+----------+-------------------+   +---------+---------------+---------+-----------+----------+--------------+  LEFT     CompressibilityPhasicitySpontaneityPropertiesThrombus Aging +---------+---------------+---------+-----------+----------+--------------+ CFV      Full           Yes      Yes                                 +---------+---------------+---------+-----------+----------+--------------+ SFJ      Full                                                        +---------+---------------+---------+-----------+----------+--------------+ FV Prox  Full                                                        +---------+---------------+---------+-----------+----------+--------------+ FV Mid   Full                                                        +---------+---------------+---------+-----------+----------+--------------+ FV DistalFull                                                         +---------+---------------+---------+-----------+----------+--------------+ PFV      Full                                                        +---------+---------------+---------+-----------+----------+--------------+ POP      Full           Yes      Yes                                 +---------+---------------+---------+-----------+----------+--------------+ PTV      Full                                                        +---------+---------------+---------+-----------+----------+--------------+ PERO     Full                                                        +---------+---------------+---------+-----------+----------+--------------+     Summary: RIGHT: - There is no evidence of deep vein thrombosis in the lower extremity. However, portions of this examination were limited- see technologist comments above.  - Cystic structure found in the popliteal fossa.  LEFT: - There is no evidence of  deep vein thrombosis in the lower extremity.  - No cystic structure found in the popliteal fossa.  *See table(s) above for measurements and observations. Electronically signed by Orlie Pollen on 12/19/2020 at 10:36:03 AM.    Final     Assessment/Plan: I reviewed the CT scan of the lumbar spine and I agree with the report.  It is somewhat concerning for osteomyelitis.  She is somewhat weak in her dorsiflexors and I do not know if that is acute or chronic.  She cannot have an MRI with and without contrast because of her bladder stim and pacemaker.  Check sed rate and CRP that these are likely to be high from sepsis  Consider CT scan of the lumbar spine with contrast versus CT myelogram which would give a better picture of the canal.  Could consider aspiration of the area through interventional radiology if they think there is something they could hit  For now, without further imaging, the treatment would be medical with IV  antibiotics.  Would consider get infectious disease involved.  Will list Dr. Venetia Constable and make him aware of her presence in the hospital Estimated body mass index is 41.89 kg/m as calculated from the following:   Height as of this encounter: 5' (1.524 m).   Weight as of this encounter: 97.3 kg.    LOS: 2 days    Eustace Moore 12/20/2020, 10:09 AM

## 2020-12-20 NOTE — Progress Notes (Signed)
   12/19/20 1930  Assess: MEWS Score  Temp (!) 100.7 F (38.2 C)  BP (!) 95/50  Pulse Rate (!) 127  ECG Heart Rate (!) 135  Resp 20  SpO2 99 %  O2 Device Nasal Cannula  Patient Activity (if Appropriate) In bed  O2 Flow Rate (L/min) 3 L/min  Assess: MEWS Score  MEWS Temp 1  MEWS Systolic 1  MEWS Pulse 3  MEWS RR 0  MEWS LOC 0  MEWS Score 5  MEWS Score Color Red  Assess: if the MEWS score is Yellow or Red  Were vital signs taken at a resting state? Yes  Focused Assessment No change from prior assessment  Early Detection of Sepsis Score *See Row Information* High  MEWS guidelines implemented *See Row Information* No, previously red, continue vital signs every 4 hours  Treat  MEWS Interventions Administered prn meds/treatments  Take Vital Signs  Increase Vital Sign Frequency  Red: Q 1hr X 4 then Q 4hr X 4, if remains red, continue Q 4hrs  Escalate  MEWS: Escalate Red: discuss with charge nurse/RN and provider, consider discussing with RRT  Notify: Charge Nurse/RN  Name of Charge Nurse/RN Notified Chris, RN  Date Charge Nurse/RN Notified 12/19/20  Time Charge Nurse/RN Notified 1947  Notify: Provider  Provider Name/Title Imogene Burn, MD  Date Provider Notified 12/19/20  Time Provider Notified 2049  Notification Type Page  Notification Reason Change in status (Continued Red MEWS. Cont. elevate HR,Resp,low BP, and fever.)  Provider response No new orders Imogene Burn, MD states to page Cardiology.)  Date of Provider Response 12/19/20  Time of Provider Response 2051  Notify: Rapid Response  Name of Rapid Response RN Notified Toni Amend, RN  Date Rapid Response Notified 12/19/20  Time Rapid Response Notified 2300  Document  Patient Outcome Not stable and remains on department   No response from Cardiology so far. This RN re-paged Cardiology. This RN also re-paged Imogene Burn, MD to notify of continued fever despite Tylenol given, and continued severe pain. Ice packs placed on patient for fever.  Patient not wanting to lay on side to place ice packs on back for pain. Patient's daughter, [name], is in room with patient. Will continue to monitor.

## 2020-12-20 NOTE — Consult Note (Signed)
NAME:  Jillian Leblanc, MRN:  [id number], DOB:  03-27-1945, LOS: 2 ADMISSION DATE:  12/18/2020, CONSULTATION DATE:  12/20/20 REFERRING MD:  Bridgett Larsson, Hospitalist, CHIEF COMPLAINT:  Back pain    History of Present Illness:  75 yo woman who presented with back pain after falls, developed sepsis, Ecoli from Blood cultures, now with hypotension and afib with RVR, fever.  Admitted 11/25 pm with SOB, non productive cough, subjective fever, fatigue, muscle aches, and back pain (hadn't completely resolved after prior recent discharge).   Increasing confusion and intermittent somnolence at home, poor po intake. ICU asked to consult for ongoing hypotension, tachypnea, afib with RVR in the setting of sepsis with ecoli, osteomyelitis.   Per notes, rapid response at around 7pm, patient was tachypneic and using accesory muscles at the time. Cards called for afib, rec amio as needed, advised against further beta blocker or CCB.   Fever increased at around 7pm 11/26  Continued to have dyspnea and severe back pain overnight.  Fentanyl does not appear to be very helpful, last received at around 10pm.  Fevers increasing since 7pm, was given tylenol prior to my arrival.    Neurosurgery has seen her regarding the compression fracture and it has been managed non operatively during prior admissions.    Recent hx of AKI and hydronephrosis.    Cr actually improved from yesterday to tonight.   Hypoalbuminemia UTI  CT - no hydro   Musculoskeletal: A compression fracture of L5 is again identified, increased from 35% on 11/22/2020 to 50% today, now with irregularity of the L5 vertebral body. There are foci of gas in the L4-S1 paravertebral regions with small amount of gas posterior along the spinal canal at L5-S1. No discrete focal collection is identified on this noncontrast study. Disc protrusion at L5-S1 is again noted.   A healing RIGHT L1 transverse process fracture remote T12 compression fracture is  present.    Pertinent  Medical History  DM2 Interstitial cystitis Hypothyroidism Chronic pain Vertebral compression fracture HFpEF  Significant Hospital Events: Including procedures, antibiotic start and stop dates in addition to other pertinent events     Interim History / Subjective:  Admitted 11/25, started on antibiotics (cefepime, flagyl, vanc,) narrowed to ctx 11/26 evening.   Objective   Blood pressure (!) 93/56, pulse (!) 143, temperature (!) 100.6 F (38.1 C), temperature source Oral, resp. rate (!) 28, SpO2 99 %.        Intake/Output Summary (Last 24 hours) at 12/20/2020 0048 Last data filed at 12/20/2020 0013 Gross per 24 hour  Intake 1966.96 ml  Output --  Net 1966.96 ml   There were no vitals filed for this visit.  Examination: General: moaning, alert, appears uncomfortable  HENT: NCAT Lungs: minimal crackles at bases  Cardiovascular: atrial fibrillation, tachy Abdomen: nt, nd, nbs,  Extremities: no edema, eschar on the knees B Neuro: agitated, uncomfortable appearing  GU:  Resolved Hospital Problem list     Assessment & Plan:  75 yo woman with hx of DM2, cystitis, here with sepsis, ecoli bacteremia, UTI, vertebral compression fracture, osteomyelitis.   Sepsis, fever.  UTI Vertebral fracture, osteomyelitis.  Atrial fibrillation with RVR Hypotension Pain, anxiety Agitation, delirium AKI, improving  Hx of urinary obstruction, no hydronephrosis now on CT.  Thrombocytopenia  Stopping fluids now given dyspnea, though may be 2/2 anxiety, pain and fever.   Trial of amiodarone  Start Meropenem, stop ctx.  Cover for possible ESBL given hx of infections and hospitalizations.   Cont to  treat pain prn.   Transfer to ICU for closer monitoring.   Best Practice (right click and "Reselect all SmartList Selections" daily)   Diet/type: NPO  DVT prophylaxis: other GI prophylaxis: N/A Lines: N/A Foley:  N/A Code Status:  full code Last date of  multidisciplinary goals of care discussion []   Labs   CBC: Recent Labs  Lab 12/18/20 1949 12/19/20 0208 12/19/20 0237  WBC 5.4  --  4.4  NEUTROABS 4.8  --  4.0  HGB 8.9* 8.5* 8.6*  HCT 27.3* 25.0* 26.5*  MCV 88.6  --  91.4  PLT 69*  --  57*    Basic Metabolic Panel: Recent Labs  Lab 12/18/20 1949 12/19/20 0208 12/19/20 0237 12/19/20 2337  NA 126* 129* 126* 129*  K 4.2 3.8 3.8 3.4*  CL 94*  --  96* 97*  CO2 19*  --  17* 21*  GLUCOSE 129*  --  99 78  BUN 37*  --  35* 28*  CREATININE 2.20*  --  1.98* 1.55*  CALCIUM 7.0*  --  6.8* 7.3*  MG  --   --  1.5* 1.7   GFR: Estimated Creatinine Clearance: 31.9 mL/min (A) (by C-G formula based on SCr of 1.55 mg/dL (H)). Recent Labs  Lab 12/18/20 1949 12/18/20 1955 12/18/20 2148 12/19/20 0237  PROCALCITON  --   --   --  16.24  WBC 5.4  --   --  4.4  LATICACIDVEN  --  2.1* 1.8  --     Liver Function Tests: Recent Labs  Lab 12/18/20 1949 12/19/20 0237 12/19/20 2337  AST 43* 39 30  ALT 29 27 23   ALKPHOS 217* 172* 208*  BILITOT 1.6* 1.8* 1.5*  PROT 6.1* 5.7* 5.3*  ALBUMIN 2.3* 2.2* 1.8*   No results for input(s): LIPASE, AMYLASE in the last 168 hours. No results for input(s): AMMONIA in the last 168 hours.  ABG    Component Value Date/Time   PHART 7.384 12/19/2020 0208   PCO2ART 30.6 (L) 12/19/2020 0208   PO2ART 83 12/19/2020 0208   HCO3 18.3 (L) 12/19/2020 0208   TCO2 19 (L) 12/19/2020 0208   ACIDBASEDEF 6.0 (H) 12/19/2020 0208   O2SAT 96.0 12/19/2020 0208     Coagulation Profile: Recent Labs  Lab 12/18/20 1949  INR 1.2    Cardiac Enzymes: No results for input(s): CKTOTAL, CKMB, CKMBINDEX, TROPONINI in the last 168 hours.  HbA1C: Hgb A1c MFr Bld  Date/Time Value Ref Range Status  11/23/2020 05:33 PM 8.4 (H) 4.8 - 5.6 % Final    Comment:    (NOTE) Pre diabetes:          5.7%-6.4%  Diabetes:              >6.4%  Glycemic control for   <7.0% adults with diabetes   09/14/2020 08:36 PM 7.5 (H)  4.8 - 5.6 % Final    Comment:    (NOTE) Pre diabetes:          5.7%-6.4%  Diabetes:              >6.4%  Glycemic control for   <7.0% adults with diabetes     CBG: Recent Labs  Lab 12/19/20 0820 12/19/20 0850 12/19/20 1150 12/19/20 1657 12/19/20 2005  GLUCAP 94 104* 73 86 92    Review of Systems:   Unable to assess.  Back pain is predominant   Past Medical History:  She,  has a past medical history of Chronic back  pain, Diabetes mellitus without complication ([place]), Interstitial cystitis, and Thyroid disease.   Surgical History:   Past Surgical History:  Procedure Laterality Date   ABDOMINAL HYSTERECTOMY     CHOLECYSTECTOMY     NECK SURGERY     PACEMAKER IMPLANT N/A 08/16/2019   Procedure: PACEMAKER IMPLANT;  Surgeon: Constance Haw, MD;  Location: [facility];  Service: Cardiovascular;  Laterality: N/A;   REVERSE SHOULDER ARTHROPLASTY Right 07/28/2020   Procedure: REVERSE SHOULDER ARTHROPLASTY;  Surgeon: Justice Britain, MD;  Location: [facility];  Service: Orthopedics;  Laterality: Right;   SHOULDER SURGERY     THYROIDECTOMY       Social History:   reports that she has never smoked. She has never used smokeless tobacco. She reports that she does not drink alcohol and does not use drugs.   Family History:  Her family history is not on file.   Allergies Allergies  Allergen Reactions   Pentosan Polysulfate Sodium     Other reaction(s): Other (See Comments) ELMIRON Y Drug hair fell out 10/21/2009 12:00:00 AM by [name] CNA 1   Elmiron [Pentosan Polysulfate]    Naloxone Other (See Comments)    Hallucinations Confusion Nightmares     Home Medications  Prior to Admission medications   Medication Sig Start Date End Date Taking? Authorizing Provider  amitriptyline (ELAVIL) 100 MG tablet Take 100 mg by mouth at bedtime.   Yes [provider]  aspirin EC 81 MG tablet Take 81 mg by mouth daily. Swallow whole.   Yes [provider]   calcium carbonate (TUMS - DOSED IN MG ELEMENTAL CALCIUM) 500 MG chewable tablet Chew 4 tablets (800 mg of elemental calcium total) by mouth 2 (two) times daily. 12/08/20  Yes Nita Sells, MD  calcium-vitamin D 250-100 MG-UNIT tablet Take 1 tablet by mouth 2 (two) times daily. 09/14/20  Yes Palumbo, April, MD  dapagliflozin propanediol (FARXIGA) 10 MG TABS tablet Take 10 mg by mouth daily. 12/12/20  Yes [provider]  furosemide (LASIX) 40 MG tablet Take 40 mg by mouth 2 (two) times daily.   Yes [provider]  gabapentin (NEURONTIN) 300 MG capsule Take 1 capsule (300 mg total) by mouth 2 (two) times daily. 12/08/20  Yes Nita Sells, MD  glipiZIDE (GLUCOTROL XL) 2.5 MG 24 hr tablet Take 1 tablet (2.5 mg total) by mouth daily with breakfast. 12/08/20  Yes Nita Sells, MD  hydrOXYzine (ATARAX/VISTARIL) 25 MG tablet Take 1 tablet (25 mg total) by mouth at bedtime as needed for anxiety. 12/08/20  Yes Nita Sells, MD  levothyroxine (SYNTHROID) 150 MCG tablet Take 150 mcg by mouth daily. 09/01/20  Yes [provider]  methocarbamol (ROBAXIN) 500 MG tablet Take 500 mg by mouth every 8 (eight) hours as needed for muscle spasms. 11/27/20  Yes [provider]  oxybutynin (DITROPAN-XL) 10 MG 24 hr tablet Take 10 mg by mouth at bedtime.   Yes [provider]  pramipexole (MIRAPEX) 1.5 MG tablet Take 1.5 mg by mouth 3 (three) times daily. 06/29/20  Yes [provider]  loperamide (IMODIUM) 2 MG capsule Take 1 capsule (2 mg total) by mouth every 4 (four) hours. Patient not taking: Reported on 12/18/2020 12/08/20   Nita Sells, MD  ondansetron (ZOFRAN-ODT) 4 MG disintegrating tablet Take 4 mg by mouth every 8 (eight) hours as needed for nausea or vomiting. Patient not taking: Reported on 12/18/2020 09/02/20   [provider]     Critical care time: 54  min

## 2020-12-20 NOTE — Progress Notes (Signed)
Chart reviewed.  Due to severe hypertension uncontrolled atrial fibrillation, patient was moved to ICU, PCCM team consulted.  PCCM team assuming this patient's care as primary attending.  Hospitalist signing off after discussing with Dr. Tonia Brooms.

## 2020-12-20 NOTE — Progress Notes (Signed)
eLink Physician-Brief Progress Note Patient Name: Jillian Leblanc DOB: July 01, 1945 MRN: [id number]   Date of Service  12/20/2020  HPI/Events of Note  Low back pain not improved with increased dose of Fentanyl. Now hypotensive - BP = 85/39 with MAP = 53. LVEF = 60-65%.  eICU Interventions  Plan: Restart home Robaxin 500 mg PO Q 8 hours PRN. Restart home Neurontin 300 mg PO Q 12 hours. Bolus with 0.9 NaCl 1 liter IV over 1 hour now.     Intervention Category Major Interventions: Other:  Lenell Antu 12/20/2020, 5:26 AM

## 2020-12-20 NOTE — Plan of Care (Signed)
  Problem: Education: °Goal: Knowledge of General Education information will improve °Description: Including pain rating scale, medication(s)/side effects and non-pharmacologic comfort measures °Outcome: Progressing °  °Problem: Health Behavior/Discharge Planning: °Goal: Ability to manage health-related needs will improve °Outcome: Progressing °  °Problem: Clinical Measurements: °Goal: Ability to maintain clinical measurements within normal limits will improve °Outcome: Progressing °Goal: Will remain free from infection °Outcome: Progressing °Goal: Diagnostic test results will improve °Outcome: Progressing °Goal: Respiratory complications will improve °Outcome: Progressing °Goal: Cardiovascular complication will be avoided °Outcome: Progressing °  °Problem: Activity: °Goal: Risk for activity intolerance will decrease °Outcome: Progressing °  °Problem: Nutrition: °Goal: Adequate nutrition will be maintained °Outcome: Progressing °  °Problem: Coping: °Goal: Level of anxiety will decrease °Outcome: Progressing °  °Problem: Elimination: °Goal: Will not experience complications related to bowel motility °Outcome: Progressing °Goal: Will not experience complications related to urinary retention °Outcome: Progressing °  °Problem: Pain Managment: °Goal: General experience of comfort will improve °Outcome: Progressing °  °Problem: Safety: °Goal: Ability to remain free from injury will improve °Outcome: Progressing °  °Problem: Skin Integrity: °Goal: Risk for impaired skin integrity will decrease °Outcome: Progressing °  °Problem: Fluid Volume: °Goal: Hemodynamic stability will improve °Outcome: Progressing °  °Problem: Clinical Measurements: °Goal: Diagnostic test results will improve °Outcome: Progressing °Goal: Signs and symptoms of infection will decrease °Outcome: Progressing °  °Problem: Respiratory: °Goal: Ability to maintain adequate ventilation will improve °Outcome: Progressing °  °Problem: Urinary  Elimination: °Goal: Signs and symptoms of infection will decrease °Outcome: Progressing °  °Problem: Education: °Goal: Knowledge of disease or condition will improve °Outcome: Progressing °Goal: Understanding of medication regimen will improve °Outcome: Progressing °Goal: Individualized Educational Video(s) °Outcome: Progressing °  °Problem: Activity: °Goal: Ability to tolerate increased activity will improve °Outcome: Progressing °  °Problem: Cardiac: °Goal: Ability to achieve and maintain adequate cardiopulmonary perfusion will improve °Outcome: Progressing °  °Problem: Health Behavior/Discharge Planning: °Goal: Ability to safely manage health-related needs after discharge will improve °Outcome: Progressing °  °

## 2020-12-20 NOTE — Progress Notes (Signed)
   12/19/20 1930 12/19/20 2030  Assess: MEWS Score  Temp (!) 100.7 F (38.2 C) (!) 101.8 F (38.8 C)  BP (!) 95/50 (!) 92/57  Pulse Rate (!) 127 (!) 146  ECG Heart Rate (!) 135 (!) 140  Resp 20 (!) 28  SpO2  --  97 %  O2 Device  --  Nasal Cannula  Patient Activity (if Appropriate)  --  In bed  O2 Flow Rate (L/min)  --  2 L/min  Assess: MEWS Score  MEWS Temp 1 2  MEWS Systolic 1 1  MEWS Pulse 3 3  MEWS RR 0 2  MEWS LOC 0 0  MEWS Score 5 8  MEWS Score Color Red Red  Notify: Provider  Provider Name/Title  --  Welton Flakes, MD  Date Provider Notified  --  12/19/20  Time Provider Notified  --  2057 (Re-paged at about 2200 d/t no response.)  Notification Type  --  Page  Notification Reason  --  Other (Comment) (Continued sustaining HR 130s-140s.)  Provider response  --  No new orders  Date of Provider Response  --  12/19/20  Time of Provider Response  --  2314 (MD called this RN.)   Toni Amend, Rapid RN, came up to see patient, and paged MD since this RN had not gotten response. Welton Flakes, MD from Cardiology, called this RN. Welton Flakes, MD stated that cardiology did not want to order any meds unless HR sustained >150. Welton Flakes, MD believes that elevated HR is more d/t severe sepsis, pain, and anxiety. Imogene Burn, MD Hospitalist, is at bedside now.

## 2020-12-20 NOTE — Progress Notes (Signed)
IR requested to eval for disc/bone aspiration for cultures Case/imaging initially reviewed with Dr. Grace Isaac Neurosurgery notes and recs reviewed.  Came to see pt to discuss. RN reports pt just recently received pain medication. Pt sleeping soundly, only arouses for a few seconds, unable to discuss procedure with pt at this time.  Per Neurosurgery, possible repeat CT L-spine with contrast vs CT myelogram. Pt does have CKD so unknown if can proceed with contrasted study. Unable to do MRI due to pt pacemaker and bladder stim.  Imaging that has been performed thus far reviewed, no defined drainable fluid collection. Can tentatively plan for L5-S1 disc/vertebral body aspiration/biopsy for culture purposes tomorrow. Will also review with NIR Dr. Corliss Skains or Dr. Tommie Sams before proceeding.   Brayton El PA-C Interventional Radiology 12/20/2020 12:15 PM

## 2020-12-20 NOTE — Plan of Care (Signed)
  Problem: Skin Integrity: Goal: Risk for impaired skin integrity will decrease 12/20/2020 2237 by Lloyd Huger, RN Outcome: Progressing 12/20/2020 2236 by Lloyd Huger, RN Outcome: Progressing   Problem: Clinical Measurements: Goal: Diagnostic test results will improve 12/20/2020 2237 by Lloyd Huger, RN Outcome: Progressing 12/20/2020 2236 by Lloyd Huger, RN Outcome: Progressing   Problem: Clinical Measurements: Goal: Signs and symptoms of infection will decrease 12/20/2020 2237 by Lloyd Huger, RN Outcome: Progressing 12/20/2020 2236 by Lloyd Huger, RN Outcome: Progressing   Problem: Respiratory: Goal: Ability to maintain adequate ventilation will improve 12/20/2020 2237 by Lloyd Huger, RN Outcome: Progressing 12/20/2020 2236 by Lloyd Huger, RN Outcome: Progressing   Problem: Education: Goal: Knowledge of disease or condition will improve 12/20/2020 2237 by Lloyd Huger, RN Outcome: Progressing 12/20/2020 2236 by Lloyd Huger, RN Outcome: Progressing   Problem: Education: Goal: Understanding of medication regimen will improve 12/20/2020 2237 by Lloyd Huger, RN Outcome: Progressing 12/20/2020 2236 by Lloyd Huger, RN Outcome: Progressing   Problem: Education: Goal: Individualized Educational Video(s) 12/20/2020 2237 by Lloyd Huger, RN Outcome: Progressing 12/20/2020 2236 by Lloyd Huger, RN Outcome: Progressing   Problem: Activity: Goal: Ability to tolerate increased activity will improve 12/20/2020 2237 by Lloyd Huger, RN Outcome: Progressing 12/20/2020 2236 by Lloyd Huger, RN Outcome: Progressing   Problem: Cardiac: Goal: Ability to achieve and maintain adequate cardiopulmonary perfusion will improve 12/20/2020 2237 by Lloyd Huger, RN Outcome: Progressing 12/20/2020 2236 by Lloyd Huger, RN Outcome: Progressing   Problem: Health Behavior/Discharge Planning: Goal: Ability to safely manage health-related needs after discharge will improve 12/20/2020 2237 by  Lloyd Huger, RN Outcome: Progressing 12/20/2020 2236 by Lloyd Huger, RN Outcome: Progressing

## 2020-12-20 NOTE — Progress Notes (Signed)
eLink Physician-Brief Progress Note Patient Name: Jillian Leblanc DOB: November 08, 1945 MRN: [id number]   Date of Service  12/20/2020  HPI/Events of Note  Back pain - Not improved with Fentanyl 25 mcg IV Q 2 hours PRN. BP 170/159.  eICU Interventions  Plan: Increase Fentanyl IV dose to 25-100 mcg IV Q 2 hours PRN pain.      Intervention Category Major Interventions: Other:  Stuart Guillen Dennard Nip 12/20/2020, 3:10 AM

## 2020-12-21 ENCOUNTER — Inpatient Hospital Stay (HOSPITAL_COMMUNITY): Payer: Medicare Other

## 2020-12-21 ENCOUNTER — Encounter (HOSPITAL_COMMUNITY): Payer: Self-pay | Admitting: Internal Medicine

## 2020-12-21 DIAGNOSIS — G9341 Metabolic encephalopathy: Secondary | ICD-10-CM | POA: Diagnosis not present

## 2020-12-21 DIAGNOSIS — A419 Sepsis, unspecified organism: Secondary | ICD-10-CM

## 2020-12-21 DIAGNOSIS — I959 Hypotension, unspecified: Secondary | ICD-10-CM | POA: Diagnosis not present

## 2020-12-21 DIAGNOSIS — R062 Wheezing: Secondary | ICD-10-CM

## 2020-12-21 DIAGNOSIS — D649 Anemia, unspecified: Secondary | ICD-10-CM | POA: Diagnosis not present

## 2020-12-21 DIAGNOSIS — M4646 Discitis, unspecified, lumbar region: Secondary | ICD-10-CM

## 2020-12-21 DIAGNOSIS — N182 Chronic kidney disease, stage 2 (mild): Secondary | ICD-10-CM

## 2020-12-21 DIAGNOSIS — I480 Paroxysmal atrial fibrillation: Secondary | ICD-10-CM

## 2020-12-21 DIAGNOSIS — E871 Hypo-osmolality and hyponatremia: Secondary | ICD-10-CM

## 2020-12-21 DIAGNOSIS — D696 Thrombocytopenia, unspecified: Secondary | ICD-10-CM | POA: Diagnosis not present

## 2020-12-21 DIAGNOSIS — R7881 Bacteremia: Secondary | ICD-10-CM

## 2020-12-21 DIAGNOSIS — J9601 Acute respiratory failure with hypoxia: Secondary | ICD-10-CM

## 2020-12-21 DIAGNOSIS — R6520 Severe sepsis without septic shock: Secondary | ICD-10-CM

## 2020-12-21 DIAGNOSIS — D61818 Other pancytopenia: Secondary | ICD-10-CM

## 2020-12-21 LAB — URINE CULTURE: Culture: 1000 — AB

## 2020-12-21 LAB — CBC WITH DIFFERENTIAL/PLATELET
Abs Immature Granulocytes: 0.06 10*3/uL (ref 0.00–0.07)
Basophils Absolute: 0 10*3/uL (ref 0.0–0.1)
Basophils Relative: 0 %
Eosinophils Absolute: 0.1 10*3/uL (ref 0.0–0.5)
Eosinophils Relative: 2 %
HCT: 24.6 % — ABNORMAL LOW (ref 36.0–46.0)
Hemoglobin: 8.6 g/dL — ABNORMAL LOW (ref 12.0–15.0)
Immature Granulocytes: 2 %
Lymphocytes Relative: 15 %
Lymphs Abs: 0.6 10*3/uL — ABNORMAL LOW (ref 0.7–4.0)
MCH: 29.7 pg (ref 26.0–34.0)
MCHC: 35 g/dL (ref 30.0–36.0)
MCV: 84.8 fL (ref 80.0–100.0)
Monocytes Absolute: 0.4 10*3/uL (ref 0.1–1.0)
Monocytes Relative: 11 %
Neutro Abs: 2.7 10*3/uL (ref 1.7–7.7)
Neutrophils Relative %: 70 %
Platelets: 68 10*3/uL — ABNORMAL LOW (ref 150–400)
RBC: 2.9 MIL/uL — ABNORMAL LOW (ref 3.87–5.11)
RDW: 15.5 % (ref 11.5–15.5)
WBC: 3.8 10*3/uL — ABNORMAL LOW (ref 4.0–10.5)
nRBC: 0 % (ref 0.0–0.2)

## 2020-12-21 LAB — CULTURE, BLOOD (ROUTINE X 2): Special Requests: ADEQUATE

## 2020-12-21 LAB — COMPREHENSIVE METABOLIC PANEL
ALT: 22 U/L (ref 0–44)
AST: 33 U/L (ref 15–41)
Albumin: 1.8 g/dL — ABNORMAL LOW (ref 3.5–5.0)
Alkaline Phosphatase: 243 U/L — ABNORMAL HIGH (ref 38–126)
Anion gap: 8 (ref 5–15)
BUN: 17 mg/dL (ref 8–23)
CO2: 21 mmol/L — ABNORMAL LOW (ref 22–32)
Calcium: 7.6 mg/dL — ABNORMAL LOW (ref 8.9–10.3)
Chloride: 98 mmol/L (ref 98–111)
Creatinine, Ser: 1.07 mg/dL — ABNORMAL HIGH (ref 0.44–1.00)
GFR, Estimated: 55 mL/min — ABNORMAL LOW (ref >60)
Glucose, Bld: 127 mg/dL — ABNORMAL HIGH (ref 70–99)
Potassium: 3.9 mmol/L (ref 3.5–5.1)
Sodium: 127 mmol/L — ABNORMAL LOW (ref 135–145)
Total Bilirubin: 1.1 mg/dL (ref 0.3–1.2)
Total Protein: 5.8 g/dL — ABNORMAL LOW (ref 6.5–8.1)

## 2020-12-21 LAB — GLUCOSE, CAPILLARY
Glucose-Capillary: 122 mg/dL — ABNORMAL HIGH (ref 70–99)
Glucose-Capillary: 132 mg/dL — ABNORMAL HIGH (ref 70–99)
Glucose-Capillary: 157 mg/dL — ABNORMAL HIGH (ref 70–99)
Glucose-Capillary: 192 mg/dL — ABNORMAL HIGH (ref 70–99)
Glucose-Capillary: 198 mg/dL — ABNORMAL HIGH (ref 70–99)

## 2020-12-21 LAB — C-REACTIVE PROTEIN: CRP: 30 mg/dL — ABNORMAL HIGH (ref <1.0)

## 2020-12-21 LAB — ECHOCARDIOGRAM LIMITED
Height: 60 in
Weight: 3428.59 oz

## 2020-12-21 LAB — HIV ANTIBODY (ROUTINE TESTING W REFLEX): HIV Screen 4th Generation wRfx: NONREACTIVE

## 2020-12-21 LAB — LEGIONELLA PNEUMOPHILA SEROGP 1 UR AG: L. pneumophila Serogp 1 Ur Ag: NEGATIVE

## 2020-12-21 LAB — SEDIMENTATION RATE: Sed Rate: 95 mm/hr — ABNORMAL HIGH (ref 0–22)

## 2020-12-21 LAB — METHYLMALONIC ACID, SERUM: Methylmalonic Acid, Quantitative: 376 nmol/L (ref 0–378)

## 2020-12-21 MED ORDER — PHENYLEPHRINE HCL-NACL 20-0.9 MG/250ML-% IV SOLN
25.0000 ug/min | INTRAVENOUS | Status: DC
  Administered 2020-12-21 (×2): 25 ug/min via INTRAVENOUS
  Filled 2020-12-21 (×2): qty 250

## 2020-12-21 MED ORDER — DEXMEDETOMIDINE HCL IN NACL 400 MCG/100ML IV SOLN
0.4000 ug/kg/h | INTRAVENOUS | Status: DC
  Administered 2020-12-21: 05:00:00 0.2 ug/kg/h via INTRAVENOUS
  Filled 2020-12-21: qty 100

## 2020-12-21 MED ORDER — AMITRIPTYLINE HCL 50 MG PO TABS
100.0000 mg | ORAL_TABLET | Freq: Every day | ORAL | Status: DC
  Administered 2020-12-21 – 2021-01-02 (×13): 100 mg via ORAL
  Filled 2020-12-21: qty 2
  Filled 2020-12-21: qty 1
  Filled 2020-12-21 (×6): qty 2
  Filled 2020-12-21: qty 1
  Filled 2020-12-21 (×6): qty 2

## 2020-12-21 MED ORDER — ARFORMOTEROL TARTRATE 15 MCG/2ML IN NEBU
15.0000 ug | INHALATION_SOLUTION | Freq: Two times a day (BID) | RESPIRATORY_TRACT | Status: DC
  Administered 2020-12-21 – 2021-01-08 (×35): 15 ug via RESPIRATORY_TRACT
  Filled 2020-12-21 (×38): qty 2

## 2020-12-21 MED ORDER — REVEFENACIN 175 MCG/3ML IN SOLN
175.0000 ug | Freq: Every day | RESPIRATORY_TRACT | Status: DC
Start: 2020-12-21 — End: 2020-12-22
  Administered 2020-12-21: 11:00:00 175 ug via RESPIRATORY_TRACT
  Filled 2020-12-21 (×2): qty 3

## 2020-12-21 MED ORDER — CEFAZOLIN SODIUM-DEXTROSE 2-4 GM/100ML-% IV SOLN
2.0000 g | Freq: Three times a day (TID) | INTRAVENOUS | Status: DC
  Administered 2020-12-21 – 2021-01-08 (×55): 2 g via INTRAVENOUS
  Filled 2020-12-21 (×54): qty 100

## 2020-12-21 MED ORDER — FENTANYL CITRATE PF 50 MCG/ML IJ SOSY
1.5000 ug | PREFILLED_SYRINGE | INTRAMUSCULAR | Status: DC | PRN
Start: 2020-12-21 — End: 2021-01-04
  Administered 2020-12-21: 10:00:00 50 ug via INTRAVENOUS
  Administered 2020-12-23: 25 ug via INTRAVENOUS
  Administered 2020-12-24 – 2020-12-26 (×2): 50 ug via INTRAVENOUS
  Administered 2020-12-31: 25 ug via INTRAVENOUS
  Filled 2020-12-21 (×6): qty 1

## 2020-12-21 MED ORDER — POTASSIUM CHLORIDE 10 MEQ/100ML IV SOLN
10.0000 meq | INTRAVENOUS | Status: AC
  Administered 2020-12-21 (×4): 10 meq via INTRAVENOUS
  Filled 2020-12-21 (×4): qty 100

## 2020-12-21 MED ORDER — FENTANYL CITRATE PF 50 MCG/ML IJ SOSY
100.0000 ug | PREFILLED_SYRINGE | Freq: Once | INTRAMUSCULAR | Status: DC
  Filled 2020-12-21 (×2): qty 2

## 2020-12-21 MED ORDER — IPRATROPIUM-ALBUTEROL 0.5-2.5 (3) MG/3ML IN SOLN
3.0000 mL | Freq: Four times a day (QID) | RESPIRATORY_TRACT | Status: DC
Start: 2020-12-21 — End: 2020-12-22
  Administered 2020-12-21 – 2020-12-22 (×4): 3 mL via RESPIRATORY_TRACT
  Filled 2020-12-21 (×5): qty 3

## 2020-12-21 MED ORDER — SODIUM CHLORIDE 0.9 % IV SOLN: 250.0000 mL | INTRAVENOUS | Status: DC

## 2020-12-21 MED ORDER — MAGNESIUM SULFATE 2 GM/50ML IV SOLN
2.0000 g | Freq: Once | INTRAVENOUS | Status: AC
  Administered 2020-12-21: 10:00:00 2 g via INTRAVENOUS
  Filled 2020-12-21: qty 50

## 2020-12-21 MED ORDER — METHYLPREDNISOLONE SODIUM SUCC 40 MG IJ SOLR
40.0000 mg | INTRAMUSCULAR | Status: DC
  Administered 2020-12-21 – 2020-12-30 (×10): 40 mg via INTRAVENOUS
  Filled 2020-12-21 (×10): qty 1

## 2020-12-21 MED ORDER — TECHNETIUM TO 99M ALBUMIN AGGREGATED
4.4000 | Freq: Once | INTRAVENOUS | Status: AC | PRN
  Administered 2020-12-21: 11:00:00 4.4 via INTRAVENOUS

## 2020-12-21 MED ORDER — INSULIN ASPART 100 UNIT/ML IJ SOLN
1.0000 [IU] | INTRAMUSCULAR | Status: DC
Start: 2020-12-21 — End: 2020-12-23
  Administered 2020-12-21: 12:00:00 1 [IU] via SUBCUTANEOUS
  Administered 2020-12-21 (×3): 2 [IU] via SUBCUTANEOUS
  Administered 2020-12-22: 3 [IU] via SUBCUTANEOUS
  Administered 2020-12-22: 2 [IU] via SUBCUTANEOUS
  Administered 2020-12-22: 3 [IU] via SUBCUTANEOUS
  Administered 2020-12-22 (×2): 2 [IU] via SUBCUTANEOUS
  Administered 2020-12-22: 3 [IU] via SUBCUTANEOUS
  Administered 2020-12-23 (×4): 2 [IU] via SUBCUTANEOUS
  Administered 2020-12-23: 3 [IU] via SUBCUTANEOUS

## 2020-12-21 NOTE — Progress Notes (Signed)
Progress Note  Patient Name: Jillian Leblanc Date of Encounter: 12/21/2020  [facility] Cardiologist: None  EP: Dr. Curt Bears  Subjective   Placed on precedex overnight to assist with pain management. Sleeping comfortably on my arrival but awakens easily. Shakes head yes when asked if she has back pain, but denies other concerns when asked yes/no questions.  Inpatient Medications    Scheduled Meds:  acetaminophen  650 mg Oral Once   calcium carbonate  800 mg of elemental calcium Oral BID   calcium-vitamin D  0.5 tablet Oral BID   Chlorhexidine Gluconate Cloth  6 each Topical Q0600   gabapentin  300 mg Oral BID   insulin aspart  0-6 Units Subcutaneous TID WC   mouth rinse  15 mL Mouth Rinse BID   mupirocin ointment  1 application Nasal BID   pramipexole  1.5 mg Oral TID   Continuous Infusions:  sodium chloride 10 mL/hr at 12/21/20 0700   amiodarone 30 mg/hr (12/21/20 0700)   cefTRIAXone (ROCEPHIN)  IV Stopped (12/20/20 1748)   dexmedetomidine (PRECEDEX) IV infusion 0.1 mcg/kg/hr (12/21/20 0700)   PRN Meds: sodium chloride, acetaminophen **OR** acetaminophen, docusate sodium, fentaNYL (SUBLIMAZE) injection, hydrOXYzine, ipratropium-albuterol, lip balm, methocarbamol, naLOXone (NARCAN)  injection, oxyCODONE, polyethylene glycol   Vital Signs    Vitals:   12/21/20 0630 12/21/20 0645 12/21/20 0700 12/21/20 0758  BP: (!) 103/46 (!) 94/48 (!) 99/58   Pulse: (!) 113 (!) 109 92   Resp: (!) 29 20 17    Temp:    99.2 F (37.3 C)  TempSrc:    Oral  SpO2: 100% 100% 100%   Weight:      Height:        Intake/Output Summary (Last 24 hours) at 12/21/2020 0847 Last data filed at 12/21/2020 0700 Gross per 24 hour  Intake 1049.96 ml  Output 1655 ml  Net -605.04 ml   Last 3 Weights 12/21/2020 12/20/2020 12/08/2020  Weight (lbs) 214 lb 4.6 oz 214 lb 8.1 oz 194 lb 3.6 oz  Weight (kg) 97.2 kg 97.3 kg 88.1 kg      Telemetry    Atrial fibrillation, rates 100s-140s - Personally  Reviewed  ECG    11/26 afib RVR at 156 bpm - Personally Reviewed  Physical Exam   GEN: No acute distress. Initially sleeping comfortably.  Neck: No JVD appreciated Cardiac: RRR, no murmurs, rubs, or gallops.  Respiratory: coarse diffusely GI: Soft, nontender, non-distended  MS: trivial LE bilateral edema; No deformity. Neuro:  Nonfocal, on precedex  Labs    High Sensitivity Troponin:  No results for input(s): TROPONINIHS in the last 720 hours.   Chemistry Recent Labs  Lab 12/19/20 0237 12/19/20 2337 12/20/20 0429 12/21/20 0134  NA 126* 129* 129* 127*  K 3.8 3.4* 3.6 3.9  CL 96* 97* 98 98  CO2 17* 21* 18* 21*  GLUCOSE 99 78 85 127*  BUN 35* 28* 25* 17  CREATININE 1.98* 1.55* 1.56* 1.07*  CALCIUM 6.8* 7.3* 7.4* 7.6*  MG 1.5* 1.7  --   --   PROT 5.7* 5.3* 5.5* 5.8*  ALBUMIN 2.2* 1.8* 1.9* 1.8*  AST 39 30 29 33  ALT 27 23 23 22   ALKPHOS 172* 208* 208* 243*  BILITOT 1.8* 1.5* 1.7* 1.1  GFRNONAA 26* 35* 35* 55*  ANIONGAP 13 11 13 8     Lipids No results for input(s): CHOL, TRIG, HDL, LABVLDL, LDLCALC, CHOLHDL in the last 168 hours.  Hematology Recent Labs  Lab 12/19/20 2337 12/20/20 [id number]  12/21/20 0134  WBC 2.8* 4.1 3.8*  RBC 2.58* 2.83* 2.90*  HGB 7.6* 8.1* 8.6*  HCT 22.0* 24.2* 24.6*  MCV 85.3 85.5 84.8  MCH 29.5 28.6 29.7  MCHC 34.5 33.5 35.0  RDW 15.1 15.2 15.5  PLT 48* 54* 68*   Thyroid  Recent Labs  Lab 12/19/20 0237 12/19/20 1235  TSH 5.442*  --   FREET4  --  1.28*    BNP Recent Labs  Lab 12/19/20 0237  BNP 199.7*    DDimer  Recent Labs  Lab 12/19/20 1235  DDIMER 8.61*     Radiology    CT ABDOMEN PELVIS WO CONTRAST  Result Date: 12/19/2020 CLINICAL DATA:  75 year old female abdominal and pelvic pain. Sepsis. EXAM: CT ABDOMEN AND PELVIS WITHOUT CONTRAST TECHNIQUE: Multidetector CT imaging of the abdomen and pelvis was performed following the standard protocol without IV contrast. COMPARISON:  11/22/2020 lumbar spine CT 06/08/2018 CT  FINDINGS: Please note that parenchymal and vascular abnormalities may be missed as intravenous contrast was not administered. Lower chest: No acute abnormality. Pacemaker/AICD leads are present. Hepatobiliary: No hepatic abnormalities are identified. The patient is status post cholecystectomy. There is no evidence of intrahepatic or extrahepatic biliary dilatation. 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 There is no evidence gas within the renal collecting system or renal parenchyma. No hydronephrosis is identified. Scattered areas of RIGHT renal scarring are present. The adrenal glands are unremarkable. Stomach/Bowel: There is no evidence of bowel obstruction, definite bowel wall thickening or bowel inflammatory changes. Vascular/Lymphatic: Aortic atherosclerosis. No enlarged abdominal or pelvic lymph nodes. Reproductive: Status post hysterectomy. No adnexal masses. Other: Subcutaneous edema is noted. There is no evidence of ascites, focal collection or pneumoperitoneum. Musculoskeletal: A compression fracture of L5 is again identified, increased from 35% on 11/22/2020 to 50% today, now with irregularity of the L5 vertebral body. There are foci of gas in the L4-S1 paravertebral regions with small amount of gas posterior along the spinal canal at L5-S1. No discrete focal collection is identified on this noncontrast study. Disc protrusion at L5-S1 is again noted. A healing RIGHT L1 transverse process fracture remote T12 compression fracture is present. Sacral stimulator again noted IMPRESSION: 1. Increasing compression fracture and heterogeneity of L5 with new paravertebral gas from L4-S1 since 11/22/2020. Although L5 bony changes and paravertebral gas could be related to healing fracture and gas extension from vacuum disc, this is suspicious for infection/osteomyelitis given patient's sepsis. 2. 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 No evidence of gas within the renal collecting system or renal parenchyma. 3. Aortic Atherosclerosis (ICD10-I70.0). Critical Value/emergent results were called by telephone at the time of interpretation on 12/19/2020 at 10:58 am to provider [facility] , who verbally acknowledged these results. Electronically Signed   By: Margarette Canada M.D.   On: 12/19/2020 11:00   CT L-SPINE NO CHARGE  Result Date: 12/19/2020 CLINICAL DATA:  Epidural abscess.  Chronic back pain. EXAM: CT LUMBAR SPINE WITHOUT CONTRAST TECHNIQUE: Multiplanar CT image reconstructions of the lumbar spine were generated from data acquired during abdominopelvic CT of the same date. Abdominopelvic CT findings dictated separately. COMPARISON:  Abdominopelvic CT same date. Lumbar spine CT 11/22/2020. FINDINGS: Segmentation: There are 5 lumbar type vertebral bodies. Alignment: Normal. Vertebrae: Mildly progressive loss of vertebral body height at the L5 burst fracture compared with the CT from last month. There is approximately 40% loss of vertebral body height with progressive irregular lucency  and sclerosis in the L5 vertebral body and 1st sacral segment, suspicious for osteomyelitis. A chronic superior endplate compression deformity at T12 is unchanged. The sacroiliac joints appear unremarkable. There is a right sacral spinal stimulator. Paraspinal and other soft tissues: There is gas and soft tissue stranding within the paraspinal soft tissues at L4 and L5. No focal fluid collections are identified. There is gas within the urinary bladder wall. Additional abdominopelvic findings deferred to separate abdominopelvic CT report. Disc levels: Stable chronic disc and endplate degeneration at T11-12, T12-L1 and L1-2. No significant disc space findings at L2-3 or L3-4. L4-5: Disc height is preserved. Annular disc bulging appears mildly progressive, and there is new gas within the left lateral recess. There  is mild facet and ligamentous hypertrophy. Lateral recess and foraminal narrowing appears progressive. No drainable fluid collection identified. L5-S1: Interval decreased gas within the disc with progressive annular disc bulging and a right paracentral disc protrusion. As above, there are small collections of gas within the paraspinal soft tissues as well as the ventral epidural space. Lateral recess and foraminal narrowing appears progressive. No drainable fluid collection identified. IMPRESSION: 1. Progressive loss of vertebral body height at the L5 fracture compared with prior CT of 1 month ago. Osseous changes within the L5 vertebral body and upper sacrum are suspicious for developing osteomyelitis and discitis, especially given the paraspinal inflammatory changes and small gas collections. 2. Increased lateral recess and foraminal narrowing bilaterally at L4-5 and L5-S1, in part due to epidural gas. Assessment for epidural fluid collections is limited by noncontrast CT. Consider MRI for further evaluation if possible (patient has a sacral spinal stimulator). If unable to undergo MRI, dedicated postcontrast imaging of the lumbar spine could be performed. 3. Abdominopelvic findings dictated separately. Electronically Signed   By: Carey Bullocks M.D.   On: 12/19/2020 12:46   DG CHEST PORT 1 VIEW  Result Date: 12/21/2020 CLINICAL DATA:  Sepsis, wheezing, shortness of breath. EXAM: PORTABLE CHEST 1 VIEW COMPARISON:  12/19/2020 and CT chest 09/29/2020. FINDINGS: [name] slightly rotated. Trachea is midline. Heart is enlarged, stable. Thoracic aorta is calcified. Pacemaker lead tips are in the right atrium and right ventricle. Lungs are somewhat low in volume with mild pulmonary vascular prominence. No pleural fluid. Right shoulder arthroplasty. Gaseous distension of the stomach, incompletely imaged. IMPRESSION: 1. Mild pulmonary vascular prominence may be due to low lung volumes. Difficult to exclude mild  edema. 2.  Aortic atherosclerosis (ICD10-I70.0). Electronically Signed   By: Leanna Battles M.D.   On: 12/21/2020 08:06   DG HIP UNILAT WITH PELVIS 2-3 VIEWS RIGHT  Result Date: 12/19/2020 CLINICAL DATA:  Right hip and back pain after multiple falls. EXAM: DG HIP (WITH OR WITHOUT PELVIS) 2-3V RIGHT COMPARISON:  12/04/2020. FINDINGS: No fracture or bone lesion. Hip joints, SI joints and symphysis pubis are normally spaced and aligned. Left lower quadrant stimulator device and right mid pelvic lead are unchanged. Soft tissues otherwise unremarkable. IMPRESSION: 1. No fracture or acute finding. Electronically Signed   By: Amie Portland M.D.   On: 12/19/2020 11:07   VAS [place] LOWER EXTREMITY VENOUS (DVT)  Result Date: 12/19/2020  Lower Venous DVT Study Patient Name:  CHARIYAH WISZ  Date of Exam:   12/19/2020 Medical Rec #: [id number]       Accession #:    [id number] Date of Birth: 1945/12/16       Patient Gender: F Patient Age:   7 years Exam Location:  [facility] Procedure:  VAS [place] LOWER EXTREMITY VENOUS (DVT) Referring Phys: RAVI PAHWANI --------------------------------------------------------------------------------  Indications: Edema.  Risk Factors: CHF. Limitations: Body habitus and patient intolerance to compression at some segments. Comparison Study: No prior studies. Performing Technologist: Jean Rosenthal RDMS, RVT  Examination Guidelines: A complete evaluation includes B-mode imaging, spectral Doppler, color Doppler, and power Doppler as needed of all accessible portions of each vessel. Bilateral testing is considered an integral part of a complete examination. Limited examinations for reoccurring indications may be performed as noted. The reflux portion of the exam is performed with the patient in reverse Trendelenburg.  +---------+---------------+---------+-----------+----------+-------------------+ RIGHT    CompressibilityPhasicitySpontaneityPropertiesThrombus Aging       +---------+---------------+---------+-----------+----------+-------------------+ CFV                     Yes      Yes                  Unable to compress                                                        due to patient                                                            discomfort          +---------+---------------+---------+-----------+----------+-------------------+ SFJ      Full                                                             +---------+---------------+---------+-----------+----------+-------------------+ FV Prox  Full                                                             +---------+---------------+---------+-----------+----------+-------------------+ FV Mid   Full                                                             +---------+---------------+---------+-----------+----------+-------------------+ FV DistalFull                                                             +---------+---------------+---------+-----------+----------+-------------------+ PFV      Full                                                             +---------+---------------+---------+-----------+----------+-------------------+  POP      Full           Yes      Yes                                      +---------+---------------+---------+-----------+----------+-------------------+ PTV      Full                                                             +---------+---------------+---------+-----------+----------+-------------------+ PERO     Full                                                             +---------+---------------+---------+-----------+----------+-------------------+   +---------+---------------+---------+-----------+----------+--------------+ LEFT     CompressibilityPhasicitySpontaneityPropertiesThrombus Aging +---------+---------------+---------+-----------+----------+--------------+ CFV      Full            Yes      Yes                                 +---------+---------------+---------+-----------+----------+--------------+ SFJ      Full                                                        +---------+---------------+---------+-----------+----------+--------------+ FV Prox  Full                                                        +---------+---------------+---------+-----------+----------+--------------+ FV Mid   Full                                                        +---------+---------------+---------+-----------+----------+--------------+ FV DistalFull                                                        +---------+---------------+---------+-----------+----------+--------------+ PFV      Full                                                        +---------+---------------+---------+-----------+----------+--------------+ POP      Full           Yes      Yes                                 +---------+---------------+---------+-----------+----------+--------------+  PTV      Full                                                        +---------+---------------+---------+-----------+----------+--------------+ PERO     Full                                                        +---------+---------------+---------+-----------+----------+--------------+     Summary: RIGHT: - There is no evidence of deep vein thrombosis in the lower extremity. However, portions of this examination were limited- see technologist comments above.  - Cystic structure found in the popliteal fossa.  LEFT: - There is no evidence of deep vein thrombosis in the lower extremity.  - No cystic structure found in the popliteal fossa.  *See table(s) above for measurements and observations. Electronically signed by Orlie Pollen on 12/19/2020 at 10:36:03 AM.    Final     Cardiac Studies   Echo 11/24/20  1. Left ventricular ejection fraction, by estimation, is 60 to 65%.  The  left ventricle has normal function. The left ventricle has no regional  wall motion abnormalities. Left ventricular diastolic parameters are  consistent with Grade I diastolic  dysfunction (impaired relaxation). Elevated left atrial pressure.   2. Right ventricular systolic function is normal. The right ventricular  size is normal.   3. No evidence of mitral valve regurgitation.   4. The aortic valve is tricuspid. Aortic valve regurgitation is not  visualized. Mild aortic valve sclerosis is present, with no evidence of  aortic valve stenosis.   Patient Profile     75 y.o. female with PMH type II diabetes, chronic diastolic heart failure, chronic anemia whom we are following for atrial fibrillation in the setting of severe sepsis.  Assessment & Plan    Atrial fibrillation with RVR: -likely 2/2 severe sepsis -may also be 2/2 thyroid, defer to primary team -limited treatment due to hypotension -CHA2DS2/VAS Stroke Risk Points=4.  -started on amiodarone given blood pressure, rates improved but still above 100 -has chronic anemia, Hgb 8.6 currently -thrombocytopenic with Plt at 68k -given her blood counts, per initial consult anticoagulation was not started due to risk of bleeding. Continue to monitor. 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

## 2020-12-21 NOTE — Progress Notes (Signed)
IR was requested for image guided L5-S1 disc aspiration.   Case was reviewed and approved for CT guided disc aspiration by Dr. Milford Cage, however, patient appears to be too unstable for the procedure (BP 90/57 despite on pressor, tachycardic and tachypenic.) Floor RN also unsure if patient can tolerate prone position for the procedure due to back pain.   Will place the procedure on hold, will monitor the patient throughout this week to determine appropriate time for the disc aspiration.     Attending provider Dr. Chestine Spore notified.  Please call IR for questions and concerns.   Lynann Bologna Keilly Fatula PA-C 12/21/2020 10:41 AM

## 2020-12-21 NOTE — Progress Notes (Addendum)
Subjective:  Complaining of severe back pain   Antibiotics:  Anti-infectives (From admission, onward)    Start     Dose/Rate Route Frequency Ordered Stop   12/21/20 1800  ceFAZolin (ANCEF) IVPB 2g/100 mL premix        2 g 200 mL/hr over 30 Minutes Intravenous Every 8 hours 12/21/20 1216     12/20/20 1800  cefTRIAXone (ROCEPHIN) 2 g in sodium chloride 0.9 % 100 mL IVPB  Status:  Discontinued        2 g 200 mL/hr over 30 Minutes Intravenous Every 24 hours 12/20/20 0920 12/21/20 1216   12/20/20 0100  meropenem (MERREM) 1 g in sodium chloride 0.9 % 100 mL IVPB  Status:  Discontinued        1 g 200 mL/hr over 30 Minutes Intravenous Every 8 hours 12/20/20 0004 12/20/20 0920   12/19/20 2000  ceFEPIme (MAXIPIME) 2 g in sodium chloride 0.9 % 100 mL IVPB  Status:  Discontinued        2 g 200 mL/hr over 30 Minutes Intravenous Every 24 hours 12/19/20 0057 12/19/20 1023   12/19/20 2000  cefTRIAXone (ROCEPHIN) 2 g in sodium chloride 0.9 % 100 mL IVPB  Status:  Discontinued        2 g 200 mL/hr over 30 Minutes Intravenous Every 24 hours 12/19/20 1023 12/20/20 0004   12/19/20 0057  vancomycin variable dose per unstable renal function (pharmacist dosing)  Status:  Discontinued         Does not apply See admin instructions 12/19/20 0057 12/19/20 1023   12/18/20 2000  ceFEPIme (MAXIPIME) 2 g in sodium chloride 0.9 % 100 mL IVPB        2 g 200 mL/hr over 30 Minutes Intravenous  Once 12/18/20 1955 12/18/20 2050   12/18/20 2000  metroNIDAZOLE (FLAGYL) IVPB 500 mg        500 mg 100 mL/hr over 60 Minutes Intravenous  Once 12/18/20 1955 12/18/20 2210   12/18/20 2000  vancomycin (VANCOREADY) IVPB 1750 mg/350 mL        1,750 mg 175 mL/hr over 120 Minutes Intravenous  Once 12/18/20 1955 12/19/20 0045       Medications: Scheduled Meds:  acetaminophen  650 mg Oral Once   amitriptyline  100 mg Oral QHS   arformoterol  15 mcg Nebulization BID   calcium carbonate  800 mg of elemental calcium  Oral BID   calcium-vitamin D  0.5 tablet Oral BID   Chlorhexidine Gluconate Cloth  6 each Topical Q0600   fentaNYL (SUBLIMAZE) injection  100 mcg Intravenous Once   gabapentin  300 mg Oral BID   insulin aspart  1-3 Units Subcutaneous Q4H   mouth rinse  15 mL Mouth Rinse BID   methylPREDNISolone (SOLU-MEDROL) injection  40 mg Intravenous Q24H   mupirocin ointment  1 application Nasal BID   pramipexole  1.5 mg Oral TID   revefenacin  175 mcg Nebulization Daily   Continuous Infusions:  sodium chloride Stopped (12/21/20 1240)   sodium chloride     amiodarone 30 mg/hr (12/21/20 1300)    ceFAZolin (ANCEF) IV     dexmedetomidine (PRECEDEX) IV infusion 0.1 mcg/kg/hr (12/21/20 1300)   phenylephrine (NEO-SYNEPHRINE) Adult infusion 25 mcg/min (12/21/20 1300)   potassium chloride 100 mL/hr at 12/21/20 1300   PRN Meds:.sodium chloride, acetaminophen **OR** acetaminophen, docusate sodium, fentaNYL (SUBLIMAZE) injection, hydrOXYzine, ipratropium-albuterol, lip balm, methocarbamol, naLOXone (NARCAN)  injection, oxyCODONE, polyethylene glycol  Objective: Weight change: -0.1 kg  Intake/Output Summary (Last 24 hours) at 12/21/2020 1333 Last data filed at 12/21/2020 1300 Gross per 24 hour  Intake 1226.34 ml  Output 1700 ml  Net -473.66 ml   Blood pressure (!) 97/55, pulse (!) 115, temperature 99.3 F (37.4 C), temperature source Oral, resp. rate 10, height 5' (1.524 m), weight 97.2 kg, SpO2 96 %. Temp:  [98.1 F (36.7 C)-99.7 F (37.6 C)] 99.3 F (37.4 C) (11/28 1124) Pulse Rate:  [74-149] 115 (11/28 1300) Resp:  [10-29] 10 (11/28 1300) BP: (89-144)/(44-107) 97/55 (11/28 1300) SpO2:  [96 %-100 %] 96 % (11/28 1300) FiO2 (%):  [28 %] 28 % (11/28 1132) Weight:  [97.2 kg] 97.2 kg (11/28 0542)  Physical Exam: Physical Exam Constitutional:      General: She is in acute distress.     Appearance: She is well-developed. She is ill-appearing. She is not diaphoretic.  HENT:     Head:  Normocephalic and atraumatic.     Right Ear: External ear normal.     Left Ear: External ear normal.     Mouth/Throat:     Pharynx: No oropharyngeal exudate.  Eyes:     General: No scleral icterus.    Extraocular Movements: Extraocular movements intact.     Conjunctiva/sclera: Conjunctivae normal.  Cardiovascular:     Rate and Rhythm: Regular rhythm. Tachycardia present.     Heart sounds: Normal heart sounds. No murmur heard.   No friction rub. No gallop.  Pulmonary:     Effort: Pulmonary effort is normal. No respiratory distress.     Breath sounds: Transmitted upper airway sounds present. Wheezing present. No rales.  Abdominal:     General: Bowel sounds are normal. There is no distension.     Palpations: Abdomen is soft.     Tenderness: There is no abdominal tenderness. There is no rebound.  Musculoskeletal:        General: No tenderness. Normal range of motion.  Lymphadenopathy:     Cervical: No cervical adenopathy.  Skin:    General: Skin is warm and dry.     Coloration: Skin is not pale.     Findings: No erythema or rash.  Neurological:     General: No focal deficit present.     Mental Status: She is alert.     Comments: Weaker dorsiflexion esp on left  Psychiatric:        Attention and Perception: Attention normal.        Mood and Affect: Mood is depressed.        Speech: Speech is delayed.        Behavior: Behavior is cooperative.        Thought Content: Thought content normal.        Cognition and Memory: Memory is impaired. She exhibits impaired recent memory.     CBC:    BMET Recent Labs    12/20/20 0429 12/21/20 0134  NA 129* 127*  K 3.6 3.9  CL 98 98  CO2 18* 21*  GLUCOSE 85 127*  BUN 25* 17  CREATININE 1.56* 1.07*  CALCIUM 7.4* 7.6*     Liver Panel  Recent Labs    12/20/20 0429 12/21/20 0134  PROT 5.5* 5.8*  ALBUMIN 1.9* 1.8*  AST 29 33  ALT 23 22  ALKPHOS 208* 243*  BILITOT 1.7* 1.1       Sedimentation Rate Recent Labs     12/21/20 0134  ESRSEDRATE 95*   C-Reactive Protein Recent  Labs    12/20/20 1428 12/21/20 0134  CRP 30.6* 30.0*    Micro Results: Recent Results (from the past 720 hour(s))  Resp Panel by RT-PCR (Flu A&B, Covid) Nasopharyngeal Swab     Status: None   Collection Time: 11/22/20  8:56 PM   Specimen: Nasopharyngeal Swab; Nasopharyngeal(NP) swabs in vial transport medium  Result Value Ref Range Status   SARS Coronavirus 2 by RT PCR NEGATIVE NEGATIVE Final    Comment: (NOTE) SARS-CoV-2 target nucleic acids are NOT DETECTED.  The SARS-CoV-2 RNA is generally detectable in upper respiratory specimens during the acute phase of infection. The lowest concentration of SARS-CoV-2 viral copies this assay can detect is 138 copies/mL. A negative result does not preclude SARS-Cov-2 infection and should not be used as the sole basis for treatment or other patient management decisions. A negative result may occur with  improper specimen collection/handling, submission of specimen other than nasopharyngeal swab, presence of viral mutation(s) within the areas targeted by this assay, and inadequate number of viral copies(<138 copies/mL). A negative result must be combined with clinical observations, patient history, and epidemiological information. The expected result is Negative.  Fact Sheet for Patients:  EntrepreneurPulse.com.au  Fact Sheet for Healthcare Providers:  IncredibleEmployment.be  This test is no t yet approved or cleared by the [place] FDA and  has been authorized for detection and/or diagnosis of SARS-CoV-2 by FDA under an Emergency Use Authorization (EUA). This EUA will remain  in effect (meaning this test can be used) for the duration of the COVID-19 declaration under Section 564(b)(1) of the Act, 21 U.S.C.section 360bbb-3(b)(1), unless the authorization is terminated  or revoked sooner.       Influenza A by PCR NEGATIVE NEGATIVE  Final   Influenza B by PCR NEGATIVE NEGATIVE Final    Comment: (NOTE) The Xpert Xpress SARS-CoV-2/FLU/RSV plus assay is intended as an aid in the diagnosis of influenza from Nasopharyngeal swab specimens and should not be used as a sole basis for treatment. Nasal washings and aspirates are unacceptable for Xpert Xpress SARS-CoV-2/FLU/RSV testing.  Fact Sheet for Patients: EntrepreneurPulse.com.au  Fact Sheet for Healthcare Providers: IncredibleEmployment.be  This test is not yet approved or cleared by the [place] FDA and has been authorized for detection and/or diagnosis of SARS-CoV-2 by FDA under an Emergency Use Authorization (EUA). This EUA will remain in effect (meaning this test can be used) for the duration of the COVID-19 declaration under Section 564(b)(1) of the Act, 21 U.S.C. section 360bbb-3(b)(1), unless the authorization is terminated or revoked.  Performed at [facility], [address] [place], [place] [address]   Resp Panel by RT-PCR (Flu A&B, Covid) Nasopharyngeal Swab     Status: None   Collection Time: 12/04/20  6:24 PM   Specimen: Nasopharyngeal Swab; Nasopharyngeal(NP) swabs in vial transport medium  Result Value Ref Range Status   SARS Coronavirus 2 by RT PCR NEGATIVE NEGATIVE Final    Comment: (NOTE) SARS-CoV-2 target nucleic acids are NOT DETECTED.  The SARS-CoV-2 RNA is generally detectable in upper respiratory specimens during the acute phase of infection. The lowest concentration of SARS-CoV-2 viral copies this assay can detect is 138 copies/mL. A negative result does not preclude SARS-Cov-2 infection and should not be used as the sole basis for treatment or other patient management decisions. A negative result may occur with  improper specimen collection/handling, submission of specimen other than nasopharyngeal swab, presence of viral mutation(s) within the areas targeted by this assay, and  inadequate number of viral copies(<138 copies/mL). A negative result must be combined with clinical observations, patient history, and epidemiological information. The expected result is Negative.  Fact Sheet for Patients:  EntrepreneurPulse.com.au  Fact Sheet for Healthcare Providers:  IncredibleEmployment.be  This test is no t yet approved or cleared by the [place] FDA and  has been authorized for detection and/or diagnosis of SARS-CoV-2 by FDA under an Emergency Use Authorization (EUA). This EUA will remain  in effect (meaning this test can be used) for the duration of the COVID-19 declaration under Section 564(b)(1) of the Act, 21 U.S.C.section 360bbb-3(b)(1), unless the authorization is terminated  or revoked sooner.       Influenza A by PCR NEGATIVE NEGATIVE Final   Influenza B by PCR NEGATIVE NEGATIVE Final    Comment: (NOTE) The Xpert Xpress SARS-CoV-2/FLU/RSV plus assay is intended as an aid in the diagnosis of influenza from Nasopharyngeal swab specimens and should not be used as a sole basis for treatment. Nasal washings and aspirates are unacceptable for Xpert Xpress SARS-CoV-2/FLU/RSV testing.  Fact Sheet for Patients: EntrepreneurPulse.com.au  Fact Sheet for Healthcare Providers: IncredibleEmployment.be  This test is not yet approved or cleared by the [place] FDA and has been authorized for detection and/or diagnosis of SARS-CoV-2 by FDA under an Emergency Use Authorization (EUA). This EUA will remain in effect (meaning this test can be used) for the duration of the COVID-19 declaration under Section 564(b)(1) of the Act, 21 U.S.C. section 360bbb-3(b)(1), unless the authorization is terminated or revoked.  Performed at [facility], [place] [address], [place], [place] [address]   C Difficile Quick Screen w PCR reflex     Status: None   Collection Time: 12/06/20  8:01 AM    Specimen: STOOL  Result Value Ref Range Status   C Diff antigen NEGATIVE NEGATIVE Final   C Diff toxin NEGATIVE NEGATIVE Final   C Diff interpretation No C. difficile detected.  Final    Comment: NEGATIVE Performed at [facility], [place] [address] [place], [place] [address]   Gastrointestinal Panel by PCR , Stool     Status: None   Collection Time: 12/07/20  8:56 AM   Specimen: STOOL  Result Value Ref Range Status   Campylobacter species NOT DETECTED NOT DETECTED Final   Plesimonas shigelloides NOT DETECTED NOT DETECTED Final   Salmonella species NOT DETECTED NOT DETECTED Final   Yersinia enterocolitica NOT DETECTED NOT DETECTED Final   Vibrio species NOT DETECTED NOT DETECTED Final   Vibrio cholerae NOT DETECTED NOT DETECTED Final   Enteroaggregative E coli (EAEC) NOT DETECTED NOT DETECTED Final   Enteropathogenic E coli (EPEC) NOT DETECTED NOT DETECTED Final   Enterotoxigenic E coli (ETEC) NOT DETECTED NOT DETECTED Final   Shiga like toxin producing E coli (STEC) NOT DETECTED NOT DETECTED Final   Shigella/Enteroinvasive E coli (EIEC) NOT DETECTED NOT DETECTED Final   Cryptosporidium NOT DETECTED NOT DETECTED Final   Cyclospora cayetanensis NOT DETECTED NOT DETECTED Final   Entamoeba histolytica NOT DETECTED NOT DETECTED Final   Giardia lamblia NOT DETECTED NOT DETECTED Final   Adenovirus F40/41 NOT DETECTED NOT DETECTED Final   Astrovirus NOT DETECTED NOT DETECTED Final   Norovirus GI/GII NOT DETECTED NOT DETECTED Final   Rotavirus A NOT DETECTED NOT DETECTED Final   Sapovirus (I, II, IV, and V) NOT DETECTED NOT DETECTED Final    Comment: Performed at [facility], [address], [place],  [address]  SARS  CORONAVIRUS 2 (TAT 6-24 HRS) Nasopharyngeal Nasopharyngeal Swab     Status: None   Collection Time: 12/08/20 10:12 AM   Specimen: Nasopharyngeal Swab  Result Value Ref Range Status   SARS Coronavirus 2 NEGATIVE NEGATIVE Final    Comment:  (NOTE) SARS-CoV-2 target nucleic acids are NOT DETECTED.  The SARS-CoV-2 RNA is generally detectable in upper and lower respiratory specimens during the acute phase of infection. Negative results do not preclude SARS-CoV-2 infection, do not rule out co-infections with other pathogens, and should not be used as the sole basis for treatment or other patient management decisions. Negative results must be combined with clinical observations, patient history, and epidemiological information. The expected result is Negative.  Fact Sheet for Patients: HairSlick.no  Fact Sheet for Healthcare Providers: quierodirigir.com  This test is not yet approved or cleared by the [place] FDA and  has been authorized for detection and/or diagnosis of SARS-CoV-2 by FDA under an Emergency Use Authorization (EUA). This EUA will remain  in effect (meaning this test can be used) for the duration of the COVID-19 declaration under Se ction 564(b)(1) of the Act, 21 U.S.C. section 360bbb-3(b)(1), unless the authorization is terminated or revoked sooner.  Performed at [facility], [address] [place], [place] [address]   Blood Culture (routine x 2)     Status: Abnormal   Collection Time: 12/18/20  7:49 PM   Specimen: BLOOD  Result Value Ref Range Status   Specimen Description BLOOD RIGHT ANTECUBITAL  Final   Special Requests   Final    BOTTLES DRAWN AEROBIC AND ANAEROBIC Blood Culture results may not be optimal due to an excessive volume of blood received in culture bottles   Culture  Setup Time   Final    GRAM NEGATIVE RODS IN BOTH AEROBIC AND ANAEROBIC BOTTLES CRITICAL RESULT CALLED TO, READ BACK BY AND VERIFIED WITH: J,MILLEN PHARMD @0958  12/19/20 EB Performed at [facility], [address] [place], [place]    Culture ESCHERICHIA COLI (A)  Final   Report Status 12/21/2020 FINAL  Final   Organism ID, Bacteria  ESCHERICHIA COLI  Final      Susceptibility   Escherichia coli - MIC*    AMPICILLIN 8 SENSITIVE Sensitive     CEFAZOLIN <=4 SENSITIVE Sensitive     CEFEPIME <=0.12 SENSITIVE Sensitive     CEFTAZIDIME <=1 SENSITIVE Sensitive     CEFTRIAXONE <=0.25 SENSITIVE Sensitive     CIPROFLOXACIN <=0.25 SENSITIVE Sensitive     GENTAMICIN <=1 SENSITIVE Sensitive     IMIPENEM <=0.25 SENSITIVE Sensitive     TRIMETH/SULFA <=20 SENSITIVE Sensitive     AMPICILLIN/SULBACTAM <=2 SENSITIVE Sensitive     PIP/TAZO <=4 SENSITIVE Sensitive     * ESCHERICHIA COLI  Blood Culture ID Panel (Reflexed)     Status: Abnormal   Collection Time: 12/18/20  7:49 PM  Result Value Ref Range Status   Enterococcus faecalis NOT DETECTED NOT DETECTED Final   Enterococcus Faecium NOT DETECTED NOT DETECTED Final   Listeria monocytogenes NOT DETECTED NOT DETECTED Final   Staphylococcus species NOT DETECTED NOT DETECTED Final   Staphylococcus aureus (BCID) NOT DETECTED NOT DETECTED Final   Staphylococcus epidermidis NOT DETECTED NOT DETECTED Final   Staphylococcus lugdunensis NOT DETECTED NOT DETECTED Final   Streptococcus species NOT DETECTED NOT DETECTED Final   Streptococcus agalactiae NOT DETECTED NOT DETECTED Final   Streptococcus pneumoniae NOT DETECTED NOT DETECTED Final   Streptococcus pyogenes NOT DETECTED NOT DETECTED  Final   A.calcoaceticus-baumannii NOT DETECTED NOT DETECTED Final   Bacteroides fragilis NOT DETECTED NOT DETECTED Final   Enterobacterales DETECTED (A) NOT DETECTED Final    Comment: Enterobacterales represent a large order of gram negative bacteria, not a single organism. CRITICAL RESULT CALLED TO, READ BACK BY AND VERIFIED WITH: J,MILLEN PHARMD @0958  12/19/20 EB    Enterobacter cloacae complex NOT DETECTED NOT DETECTED Final   Escherichia coli DETECTED (A) NOT DETECTED Final    Comment: CRITICAL RESULT CALLED TO, READ BACK BY AND VERIFIED WITH: J,MILLEN PHARMD @0958  12/19/20 EB    Klebsiella  aerogenes NOT DETECTED NOT DETECTED Final   Klebsiella oxytoca NOT DETECTED NOT DETECTED Final   Klebsiella pneumoniae NOT DETECTED NOT DETECTED Final   Proteus species NOT DETECTED NOT DETECTED Final   Salmonella species NOT DETECTED NOT DETECTED Final   Serratia marcescens NOT DETECTED NOT DETECTED Final   Haemophilus influenzae NOT DETECTED NOT DETECTED Final   Neisseria meningitidis NOT DETECTED NOT DETECTED Final   Pseudomonas aeruginosa NOT DETECTED NOT DETECTED Final   Stenotrophomonas maltophilia NOT DETECTED NOT DETECTED Final   Candida albicans NOT DETECTED NOT DETECTED Final   Candida auris NOT DETECTED NOT DETECTED Final   Candida glabrata NOT DETECTED NOT DETECTED Final   Candida krusei NOT DETECTED NOT DETECTED Final   Candida parapsilosis NOT DETECTED NOT DETECTED Final   Candida tropicalis NOT DETECTED NOT DETECTED Final   Cryptococcus neoformans/gattii NOT DETECTED NOT DETECTED Final   CTX-M ESBL NOT DETECTED NOT DETECTED Final   Carbapenem resistance IMP NOT DETECTED NOT DETECTED Final   Carbapenem resistance KPC NOT DETECTED NOT DETECTED Final   Carbapenem resistance NDM NOT DETECTED NOT DETECTED Final   Carbapenem resist OXA 48 LIKE NOT DETECTED NOT DETECTED Final   Carbapenem resistance VIM NOT DETECTED NOT DETECTED Final    Comment: Performed at [facility], [address], [place], [place] [address]  Blood Culture (routine x 2)     Status: Abnormal   Collection Time: 12/18/20  8:10 PM   Specimen: BLOOD  Result Value Ref Range Status   Specimen Description BLOOD SITE NOT SPECIFIED  Final   Special Requests AEROBIC BOTTLE ONLY Blood Culture adequate volume  Final   Culture  Setup Time   Final    GRAM NEGATIVE RODS AEROBIC BOTTLE ONLY CRITICAL VALUE NOTED.  VALUE IS CONSISTENT WITH PREVIOUSLY REPORTED AND CALLED VALUE.    Culture (A)  Final    ESCHERICHIA COLI SUSCEPTIBILITIES PERFORMED ON PREVIOUS CULTURE WITHIN THE LAST 5 DAYS. Performed at [facility], [place] [address] [place],  [address]    Report Status 12/21/2020 FINAL  Final  Resp Panel by RT-PCR (Flu A&B, Covid) Nasopharyngeal Swab     Status: None   Collection Time: 12/18/20  9:10 PM   Specimen: Nasopharyngeal Swab; Nasopharyngeal(NP) swabs in vial transport medium  Result Value Ref Range Status   SARS Coronavirus 2 by RT PCR NEGATIVE NEGATIVE Final    Comment: (NOTE) SARS-CoV-2 target nucleic acids are NOT DETECTED.  The SARS-CoV-2 RNA is generally detectable in upper respiratory specimens during the acute phase of infection. The lowest concentration of SARS-CoV-2 viral copies this assay can detect is 138 copies/mL. A negative result does not preclude SARS-Cov-2 infection and should not be used as the sole basis for treatment or other patient management decisions. A negative result may occur with  improper specimen collection/handling, submission of specimen other than nasopharyngeal swab, presence of viral mutation(s) within  the areas targeted by this assay, and inadequate number of viral copies(<138 copies/mL). A negative result must be combined with clinical observations, patient history, and epidemiological information. The expected result is Negative.  Fact Sheet for Patients:  EntrepreneurPulse.com.au  Fact Sheet for Healthcare Providers:  IncredibleEmployment.be  This test is no t yet approved or cleared by the [place] FDA and  has been authorized for detection and/or diagnosis of SARS-CoV-2 by FDA under an Emergency Use Authorization (EUA). This EUA will remain  in effect (meaning this test can be used) for the duration of the COVID-19 declaration under Section 564(b)(1) of the Act, 21 U.S.C.section 360bbb-3(b)(1), unless the authorization is terminated  or revoked sooner.       Influenza A by PCR NEGATIVE NEGATIVE Final   Influenza B by PCR NEGATIVE NEGATIVE Final    Comment: (NOTE) The Xpert  Xpress SARS-CoV-2/FLU/RSV plus assay is intended as an aid in the diagnosis of influenza from Nasopharyngeal swab specimens and should not be used as a sole basis for treatment. Nasal washings and aspirates are unacceptable for Xpert Xpress SARS-CoV-2/FLU/RSV testing.  Fact Sheet for Patients: EntrepreneurPulse.com.au  Fact Sheet for Healthcare Providers: IncredibleEmployment.be  This test is not yet approved or cleared by the [place] FDA and has been authorized for detection and/or diagnosis of SARS-CoV-2 by FDA under an Emergency Use Authorization (EUA). This EUA will remain in effect (meaning this test can be used) for the duration of the COVID-19 declaration under Section 564(b)(1) of the Act, 21 U.S.C. section 360bbb-3(b)(1), unless the authorization is terminated or revoked.  Performed at [facility], [place] [address] [place], [place] [address]   MRSA Next Gen by PCR, Nasal     Status: Abnormal   Collection Time: 12/19/20 12:27 AM   Specimen: Nasal Mucosa; Nasal Swab  Result Value Ref Range Status   MRSA by PCR Next Gen DETECTED (A) NOT DETECTED Final    Comment: RESULT CALLED TO, READ BACK BY AND VERIFIED WITH: C MAS,RN@[id number]  12/19/20 [place] (NOTE) The GeneXpert MRSA Assay (FDA approved for NASAL specimens only), is one component of a comprehensive MRSA colonization surveillance program. It is not intended to diagnose MRSA infection nor to guide or monitor treatment for MRSA infections. Test performance is not FDA approved in patients less than 66 years old. Performed at [facility], [place] [address] [place], [place] [address]   Urine Culture     Status: Abnormal   Collection Time: 12/19/20 12:38 AM   Specimen: In/Out Cath Urine  Result Value Ref Range Status   Specimen Description IN/OUT CATH URINE  Final   Special Requests   Final    NONE Performed at [facility], [place] [address], [place], [place] [address]     Culture 1,000 COLONIES/mL ESCHERICHIA COLI (A)  Final   Report Status 12/21/2020 FINAL  Final   Organism ID, Bacteria ESCHERICHIA COLI (A)  Final      Susceptibility   Escherichia coli - MIC*    AMPICILLIN 8 SENSITIVE Sensitive     CEFAZOLIN <=4 SENSITIVE Sensitive     CEFEPIME <=0.12 SENSITIVE Sensitive     CEFTRIAXONE <=0.25 SENSITIVE Sensitive     CIPROFLOXACIN <=0.25 SENSITIVE Sensitive     GENTAMICIN <=1 SENSITIVE Sensitive     IMIPENEM <=0.25 SENSITIVE Sensitive     NITROFURANTOIN <=16 SENSITIVE Sensitive     TRIMETH/SULFA <=20 SENSITIVE Sensitive     AMPICILLIN/SULBACTAM 4 SENSITIVE Sensitive     PIP/TAZO <=4  SENSITIVE Sensitive     * 1,000 COLONIES/mL ESCHERICHIA COLI  Respiratory (~20 pathogens) panel by PCR     Status: None   Collection Time: 12/19/20  9:07 AM   Specimen: Respiratory  Result Value Ref Range Status   Adenovirus NOT DETECTED NOT DETECTED Final   Coronavirus 229E NOT DETECTED NOT DETECTED Final    Comment: (NOTE) The Coronavirus on the Respiratory Panel, DOES NOT test for the novel  Coronavirus (2019 nCoV)    Coronavirus HKU1 NOT DETECTED NOT DETECTED Final   Coronavirus NL63 NOT DETECTED NOT DETECTED Final   Coronavirus OC43 NOT DETECTED NOT DETECTED Final   Metapneumovirus NOT DETECTED NOT DETECTED Final   Rhinovirus / Enterovirus NOT DETECTED NOT DETECTED Final   Influenza A NOT DETECTED NOT DETECTED Final   Influenza B NOT DETECTED NOT DETECTED Final   Parainfluenza Virus 1 NOT DETECTED NOT DETECTED Final   Parainfluenza Virus 2 NOT DETECTED NOT DETECTED Final   Parainfluenza Virus 3 NOT DETECTED NOT DETECTED Final   Parainfluenza Virus 4 NOT DETECTED NOT DETECTED Final   Respiratory Syncytial Virus NOT DETECTED NOT DETECTED Final   Bordetella pertussis NOT DETECTED NOT DETECTED Final   Bordetella Parapertussis NOT DETECTED NOT DETECTED Final   Chlamydophila pneumoniae NOT DETECTED NOT DETECTED Final   Mycoplasma pneumoniae NOT DETECTED NOT  DETECTED Final    Comment: Performed at [facility], [place]. [address] [place], [place] [address]  Culture, blood (Routine X 2) w Reflex to ID Panel     Status: None (Preliminary result)   Collection Time: 12/20/20  2:28 PM   Specimen: BLOOD LEFT HAND  Result Value Ref Range Status   Specimen Description BLOOD LEFT HAND  Final   Special Requests   Final    BOTTLES DRAWN AEROBIC ONLY Blood Culture results may not be optimal due to an inadequate volume of blood received in culture bottles   Culture   Final    NO GROWTH < 24 HOURS Performed at [facility], [place] [address], [place], [place] [address]    Report Status PENDING  Incomplete  Culture, blood (Routine X 2) w Reflex to ID Panel     Status: None (Preliminary result)   Collection Time: 12/20/20  2:31 PM   Specimen: BLOOD RIGHT HAND  Result Value Ref Range Status   Specimen Description BLOOD RIGHT HAND  Final   Special Requests   Final    BOTTLES DRAWN AEROBIC AND ANAEROBIC Blood Culture adequate volume   Culture   Final    NO GROWTH < 24 HOURS Performed at [facility], [place] [address], [place], [place] [address]    Report Status PENDING  Incomplete    Studies/Results: NM Pulmonary Perfusion  Result Date: 12/21/2020 CLINICAL DATA:  Hypotension, tachypnea, atrial fibrillation with rapid ventricular response in setting of sepsis, question pulmonary embolism EXAM: NUCLEAR MEDICINE PERFUSION LUNG SCAN TECHNIQUE: Perfusion images were obtained in multiple projections after intravenous injection of radiopharmaceutical. Ventilation scans intentionally deferred if perfusion scan and chest x-ray adequate for interpretation during COVID 19 epidemic. RADIOPHARMACEUTICALS:  4.4 mCi Tc-21m MAA IV COMPARISON:  None Correlation: Chest radiograph 12/21/2020 FINDINGS: Normal perfusion lung scan. No perfusion defects. IMPRESSION: Normal perfusion lung scan. Electronically Signed   By: Lavonia Dana M.D.   On: 12/21/2020 11:43   DG  CHEST PORT 1 VIEW  Result Date: 12/21/2020 CLINICAL DATA:  Sepsis, wheezing, shortness of breath. EXAM: PORTABLE CHEST 1 VIEW COMPARISON:  12/19/2020 and  CT chest 09/29/2020. FINDINGS: [name] slightly rotated. Trachea is midline. Heart is enlarged, stable. Thoracic aorta is calcified. Pacemaker lead tips are in the right atrium and right ventricle. Lungs are somewhat low in volume with mild pulmonary vascular prominence. No pleural fluid. Right shoulder arthroplasty. Gaseous distension of the stomach, incompletely imaged. IMPRESSION: 1. Mild pulmonary vascular prominence may be due to low lung volumes. Difficult to exclude mild edema. 2.  Aortic atherosclerosis (ICD10-I70.0). Electronically Signed   By: Lorin Picket M.D.   On: 12/21/2020 08:06      Assessment/Plan:  INTERVAL HISTORY: Patient has been evaluated by Dr. Venetia Constable with neurosurgery and not a neurosurgical candidate she also is not able to have IR procedure performed a myelogram is with significant risk   Principal Problem:   Gram-negative bacteremia Active Problems:   AKI (acute kidney injury) ([place])   Type 2 diabetes mellitus with hypoglycemia without coma (HCC)   Hypothyroidism   Severe sepsis (HCC)   Acute metabolic encephalopathy   Acute hyponatremia   Thrombocytopenia (HCC)   Chronic anemia   Chronic diastolic CHF (congestive heart failure) ([place])    Jillian Leblanc is a 75 y.o. female with multiple medical problems including diabetes mellitus peripheral neuropathy chronic struct systolic heart failure pacemaker diabetes mellitus admitted to [facility] after falling acute renal failure with multiple metabolic derangements.  She had subsequent falls and CT scan in [place] what was an apparent burst fracture at L5 vertebral body.  In the interim after she went home she came back with worsening back pain fevers followed by confusion and some somnolence.  In the ER she was febrile and with septic shock and  endorgan failure.  CT of the abdomen pelvis performed that shows infection the L4 S1 with gas suspicious for osteomyelitis and infection in the spine.  Her pacemaker and her urinary device apparently make MRI not possible.  And eventual radiology cannot safely perform IR guided aspirate of disc at this point in time.  She is also medically too unstable to undergo neurosurgery.   #1  E. coli bacteremia with vertebral infection and likely paravertebral abscess:  E. coli was pansensitive and we will switch to cefazolin to give her narrow therapy without as much of the salt load that unasyn or AMP might provide given her CHF   Continue Neurological checks  She is going to undergo a transthoracic echocardiogram today that is a limited one just to look at the heart valves.  When she stabilizes can contemplate TEE but right now is not suitable for that  I spent 31 minutes of critical care time managing this complicated 75 year old lady with septic shock and multiorgan failure due to bacteremia with vertebral osteomyelitis discitis, rule out cardiac device infection with pancytopenia DIC  ARF, acute on chronic respiratory failure including  face to face counseling of the patient personally reviewing CXR, CBC CMP updated cultures,  along with , review of medical records in preparation for the visit and during the visit and in coordination of her care with Cardiology.      LOS: 3 days   Alcide Evener 12/21/2020, 1:33 PM

## 2020-12-21 NOTE — Progress Notes (Signed)
NAME:  Jillian Leblanc, MRN:  [id number], DOB:  1945/03/20, LOS: 3 ADMISSION DATE:  12/18/2020, CONSULTATION DATE:  12/20/20 REFERRING MD:  Bridgett Larsson, Hospitalist, CHIEF COMPLAINT:  Back pain    History of Present Illness:  75 yo woman who presented with back pain after falls, developed sepsis, Ecoli from Blood cultures, now with hypotension and afib with RVR, fever.  Admitted 11/25 pm with SOB, non productive cough, subjective fever, fatigue, muscle aches, and back pain (hadn't completely resolved after prior recent discharge).   Increasing confusion and intermittent somnolence at home, poor po intake. ICU asked to consult for ongoing hypotension, tachypnea, afib with RVR in the setting of sepsis with ecoli, osteomyelitis.   Per notes, rapid response at around 7pm, patient was tachypneic and using accesory muscles at the time. Cards called for afib, rec amio as needed, advised against further beta blocker or CCB.   Fever increased at around 7pm 11/26  Continued to have dyspnea and severe back pain overnight.  Fentanyl does not appear to be very helpful, last received at around 10pm.  Fevers increasing since 7pm, was given tylenol prior to my arrival.    Neurosurgery has seen her regarding the compression fracture and it has been managed non operatively during prior admissions.    Recent hx of AKI and hydronephrosis.    Cr actually improved from yesterday to tonight.   Hypoalbuminemia UTI  CT - no hydro   Musculoskeletal: A compression fracture of L5 is again identified, increased from 35% on 11/22/2020 to 50% today, now with irregularity of the L5 vertebral body. There are foci of gas in the L4-S1 paravertebral regions with small amount of gas posterior along the spinal canal at L5-S1. No discrete focal collection is identified on this noncontrast study. Disc protrusion at L5-S1 is again noted.   A healing RIGHT L1 transverse process fracture remote T12 compression fracture is  present.    Pertinent  Medical History  DM2 Interstitial cystitis Hypothyroidism Chronic pain Vertebral compression fracture HFpEF  Significant Hospital Events: Including procedures, antibiotic start and stop dates in addition to other pertinent events   11/25 admitted 11/27 moved to ICU  Interim History / Subjective:  Continues to have back pain, was started on precedex. Tentatively planning for bone biopsy for cultures today vs tomorrow due to concerns over BP and mentation.  Objective   Blood pressure 91/68, pulse (!) 120, temperature 99.2 F (37.3 C), temperature source Oral, resp. rate 18, height 5' (1.524 m), weight 97.2 kg, SpO2 100 %.        Intake/Output Summary (Last 24 hours) at 12/21/2020 0927 Last data filed at 12/21/2020 0700 Gross per 24 hour  Intake 1038.46 ml  Output 1655 ml  Net -616.54 ml    Filed Weights   12/20/20 0159 12/21/20 0542  Weight: 97.3 kg 97.2 kg    Examination: General: chronically ill appearing woman lying in bed in NAD HENT: /AT, eyes anicteric Lungs: diffuse wheezing, mild expiratory accessory muscle use.  Cardiovascular: S1S2, mildly tachycardic, irreg rhythm Abdomen:  obese, soft, NT Extremities: hand edema, no LE edema Neuro: sleeping but arouses easily with verbal stimulation, moving all extremities on command, oriented to place Derm: warm, dry, no rashes  Na+  127 BUN 17 Cr 1.07 WBC 3.8 H/H 8.6/14.6 Platelets 68 ESR 85 CRP 30 CXR personally reviewed> mild central pulmonary edema bilaterally  BNP 200 on 11/26  Resolved Hospital Problem list     Assessment & Plan:  75 yo woman with  hx of DM2, cystitis, here with sepsis, ecoli bacteremia, UTI, vertebral compression fracture, osteomyelitis.   Sepsis due ot E. Coli bacteremia, UTI. Likely septic discitis L5-S1 -con't broad spectrum antibiotics> ceftraixone per ID. Appreciate their recommendations. -Appreciate NS input-- not a candidate for operative  intervention currently and myelogram may pose a higher disk of spreading infection.  Acute respiratory failure with hypoxia Wheezing, concern for COPD-- mosaicism diffusely on lung images of CT T-spine on 11/22/20. Suspect acute pulmonary edema -diuretics when stable from BP standpoint -yupelri & brovana -con't duonebs PRN -solumedrol 45m daily -diuresis when able from BP standpoint -VQ scan pending given indeterminate LE UKoreaand elevated D-dimer  Hyponatremia, worsening CKD 2; AKI resolved -renally dose meds, avoid nephrotoxic meds -strict I/Os -needs diuresis when BP stabilizes  Acute back pain 2/2 discitis -adding PTA elavil  -con't precedex & opiates PRN -con't gabapentin  Acute metabolic encephalopathy-- multifactorial- hyponatremia, uncontrolled pain, pain meds, sepsis -avoid deliriogenic meds as much as possible -precedex is a good choice for pain adjunctive therapy -frequent reorientation  Hypotension, concern for developing septic shock vs due to sedation/ pain control -peripheral neosynephrine  Afib with RVR -amiodarone -tele monitoring -Can start heparin when platelets improve.  Acute thrombocytopenia, suspect this is due to antibiotics or sepsis -monitor  Anemia, chronic -transfuse for Hb<7 or hemodynamically significant bleeding -monitor  Hypergylcemia -SSI PRN  Best Practice (right click and "Reselect all SmartList Selections" daily)   Diet/type: NPO  DVT prophylaxis: other GI prophylaxis: N/A Lines: N/A Foley:  N/A Code Status:  full code Last date of multidisciplinary goals of care discussion '[ ]'   Labs   CBC: Recent Labs  Lab 12/18/20 1949 12/19/20 0208 12/19/20 0237 12/19/20 2337 12/20/20 0429 12/21/20 0134  WBC 5.4  --  4.4 2.8* 4.1 3.8*  NEUTROABS 4.8  --  4.0  --  3.7 2.7  HGB 8.9* 8.5* 8.6* 7.6* 8.1* 8.6*  HCT 27.3* 25.0* 26.5* 22.0* 24.2* 24.6*  MCV 88.6  --  91.4 85.3 85.5 84.8  PLT 69*  --  57* 48* 54* 68*     Basic  Metabolic Panel: Recent Labs  Lab 12/18/20 1949 12/19/20 0208 12/19/20 0237 12/19/20 2337 12/20/20 0429 12/21/20 0134  NA 126* 129* 126* 129* 129* 127*  K 4.2 3.8 3.8 3.4* 3.6 3.9  CL 94*  --  96* 97* 98 98  CO2 19*  --  17* 21* 18* 21*  GLUCOSE 129*  --  99 78 85 127*  BUN 37*  --  35* 28* 25* 17  CREATININE 2.20*  --  1.98* 1.55* 1.56* 1.07*  CALCIUM 7.0*  --  6.8* 7.3* 7.4* 7.6*  MG  --   --  1.5* 1.7  --   --     GFR: Estimated Creatinine Clearance: 48.9 mL/min (A) (by C-G formula based on SCr of 1.07 mg/dL (H)). Recent Labs  Lab 12/18/20 1955 12/18/20 2148 12/19/20 0237 12/19/20 2337 12/20/20 0429 12/21/20 0134  PROCALCITON  --   --  16.24  --   --   --   WBC  --   --  4.4 2.8* 4.1 3.8*  LATICACIDVEN 2.1* 1.8  --   --  1.5  --      Liver Function Tests: Recent Labs  Lab 12/18/20 1949 12/19/20 0237 12/19/20 2337 12/20/20 0429 12/21/20 0134  AST 43* 39 30 29 33  ALT '29 27 23 23 22  ' ALKPHOS 217* 172* 208* 208* 243*  BILITOT 1.6* 1.8* 1.5* 1.7* 1.1  PROT 6.1* 5.7* 5.3* 5.5* 5.8*  ALBUMIN 2.3* 2.2* 1.8* 1.9* 1.8*    No results for input(s): LIPASE, AMYLASE in the last 168 hours. No results for input(s): AMMONIA in the last 168 hours.  ABG    Component Value Date/Time   PHART 7.384 12/19/2020 0208   PCO2ART 30.6 (L) 12/19/2020 0208   PO2ART 83 12/19/2020 0208   HCO3 18.3 (L) 12/19/2020 0208   TCO2 19 (L) 12/19/2020 0208   ACIDBASEDEF 6.0 (H) 12/19/2020 0208   O2SAT 96.0 12/19/2020 0208     This patient is critically ill with multiple organ system failure which requires frequent high complexity decision making, assessment, support, evaluation, and titration of therapies. This was completed through the application of advanced monitoring technologies and extensive interpretation of multiple databases. During this encounter critical care time was devoted to patient care services described in this note for 36 minutes.  Julian Hy, DO 12/21/20 10:03  AM [facility]

## 2020-12-21 NOTE — Progress Notes (Signed)
Limited 2D echocardiogram completed.  12/21/2020 2:37 PM Eula Fried., MHA, RVT, RDCS, RDMS

## 2020-12-21 NOTE — Progress Notes (Signed)
eLink Physician-Brief Progress Note Patient Name: Miasia Crabtree DOB: 1945/02/03 MRN: [id number]   Date of Service  12/21/2020  HPI/Events of Note  Pt has persistent back pain, getting worse over last day and PRN's are not working as well  eICU Interventions  Pt currently sleeping but if she wakes and pain is severe again, we can consider putting her on precedex to aid in analgesia      Intervention Category Intermediate Interventions: Pain - evaluation and management  Jacinta Shoe 12/21/2020, 3:48 AM

## 2020-12-21 NOTE — Progress Notes (Signed)
Daughter updated at bedside. She understands my concern for multiple hospitalizations and prolonged recovery after critical illness when she was already very debilitated and had been falling frequently prior to admission.  Steffanie Dunn, DO 12/21/20 6:34 PM  Pulmonary & Critical Care

## 2020-12-21 NOTE — Progress Notes (Signed)
Taking over for Dr. Yetta Barre from a Neurosurgical perspective. Pt is unfortunately acutely ill with some distal foot weakness on his exam. She's altered, but today on my exam I also do feel like she has some dorsiflexion weakness 4-/5 otherwise full strength given the limitations due to her AMS.   Given her bacteremia and the location of the epidural gas, I think a myelogram has a significant risk of seeding the thecal sac. Given her thrombocytopenia, lymphopenia in the setting of sepsis, RVR with hypotension, AKI, in the setting of her multiple chronic medical problems including CHF, I do not think she is a good operative candidate at this time.  I think it's quite convincing she has discitis at L5-S1 and the myelogram would help guide a surgical approach but I don't think she's a candidate for that. If there is concern about polymicrobial infection, I think a disc space biopsy by IR is reasonable. But until she improves from a sepsis perspective, there's unfortunately not much I can do surgically to help. I discussed this with her at bedside.

## 2020-12-21 NOTE — Progress Notes (Signed)
VAST consult received to place USGIV access for vasopressors. Upon arrival at patient's bedside, patient being transported to nuclear med. Spoke with patient's nurse who stated the two IV's she has are working well, with one being monitored by [employer]. [place], RN will place consult if further IV access is needed.

## 2020-12-22 DIAGNOSIS — D696 Thrombocytopenia, unspecified: Secondary | ICD-10-CM | POA: Diagnosis not present

## 2020-12-22 DIAGNOSIS — E871 Hypo-osmolality and hyponatremia: Secondary | ICD-10-CM | POA: Diagnosis not present

## 2020-12-22 DIAGNOSIS — N179 Acute kidney failure, unspecified: Secondary | ICD-10-CM | POA: Diagnosis not present

## 2020-12-22 DIAGNOSIS — R6521 Severe sepsis with septic shock: Secondary | ICD-10-CM

## 2020-12-22 DIAGNOSIS — I480 Paroxysmal atrial fibrillation: Secondary | ICD-10-CM | POA: Diagnosis not present

## 2020-12-22 DIAGNOSIS — G9341 Metabolic encephalopathy: Secondary | ICD-10-CM | POA: Diagnosis not present

## 2020-12-22 DIAGNOSIS — R7881 Bacteremia: Secondary | ICD-10-CM | POA: Diagnosis not present

## 2020-12-22 DIAGNOSIS — A419 Sepsis, unspecified organism: Secondary | ICD-10-CM | POA: Diagnosis not present

## 2020-12-22 DIAGNOSIS — D649 Anemia, unspecified: Secondary | ICD-10-CM | POA: Diagnosis not present

## 2020-12-22 LAB — CBC WITH DIFFERENTIAL/PLATELET
Abs Immature Granulocytes: 0.05 10*3/uL (ref 0.00–0.07)
Basophils Absolute: 0 10*3/uL (ref 0.0–0.1)
Basophils Relative: 0 %
Eosinophils Absolute: 0 10*3/uL (ref 0.0–0.5)
Eosinophils Relative: 0 %
HCT: 27 % — ABNORMAL LOW (ref 36.0–46.0)
Hemoglobin: 9.1 g/dL — ABNORMAL LOW (ref 12.0–15.0)
Immature Granulocytes: 1 %
Lymphocytes Relative: 9 %
Lymphs Abs: 0.5 10*3/uL — ABNORMAL LOW (ref 0.7–4.0)
MCH: 29.2 pg (ref 26.0–34.0)
MCHC: 33.7 g/dL (ref 30.0–36.0)
MCV: 86.5 fL (ref 80.0–100.0)
Monocytes Absolute: 0.1 10*3/uL (ref 0.1–1.0)
Monocytes Relative: 2 %
Neutro Abs: 5.2 10*3/uL (ref 1.7–7.7)
Neutrophils Relative %: 88 %
Platelets: 118 10*3/uL — ABNORMAL LOW (ref 150–400)
RBC: 3.12 MIL/uL — ABNORMAL LOW (ref 3.87–5.11)
RDW: 16.2 % — ABNORMAL HIGH (ref 11.5–15.5)
WBC: 5.9 10*3/uL (ref 4.0–10.5)
nRBC: 0 % (ref 0.0–0.2)

## 2020-12-22 LAB — COMPREHENSIVE METABOLIC PANEL
ALT: 22 U/L (ref 0–44)
AST: 25 U/L (ref 15–41)
Albumin: 1.9 g/dL — ABNORMAL LOW (ref 3.5–5.0)
Alkaline Phosphatase: 257 U/L — ABNORMAL HIGH (ref 38–126)
Anion gap: 9 (ref 5–15)
BUN: 21 mg/dL (ref 8–23)
CO2: 20 mmol/L — ABNORMAL LOW (ref 22–32)
Calcium: 8.1 mg/dL — ABNORMAL LOW (ref 8.9–10.3)
Chloride: 99 mmol/L (ref 98–111)
Creatinine, Ser: 1.11 mg/dL — ABNORMAL HIGH (ref 0.44–1.00)
GFR, Estimated: 52 mL/min — ABNORMAL LOW (ref >60)
Glucose, Bld: 218 mg/dL — ABNORMAL HIGH (ref 70–99)
Potassium: 5 mmol/L (ref 3.5–5.1)
Sodium: 128 mmol/L — ABNORMAL LOW (ref 135–145)
Total Bilirubin: 0.8 mg/dL (ref 0.3–1.2)
Total Protein: 6.3 g/dL — ABNORMAL LOW (ref 6.5–8.1)

## 2020-12-22 LAB — DIC (DISSEMINATED INTRAVASCULAR COAGULATION)PANEL
D-Dimer, Quant: 4.48 ug/mL-FEU — ABNORMAL HIGH (ref 0.00–0.50)
Fibrinogen: 693 mg/dL — ABNORMAL HIGH (ref 210–475)
INR: 1.1 (ref 0.8–1.2)
Platelets: 120 10*3/uL — ABNORMAL LOW (ref 150–400)
Prothrombin Time: 14.3 seconds (ref 11.4–15.2)
Smear Review: NONE SEEN
aPTT: 32 seconds (ref 24–36)

## 2020-12-22 LAB — GLUCOSE, CAPILLARY
Glucose-Capillary: 201 mg/dL — ABNORMAL HIGH (ref 70–99)
Glucose-Capillary: 164 mg/dL — ABNORMAL HIGH (ref 70–99)
Glucose-Capillary: 185 mg/dL — ABNORMAL HIGH (ref 70–99)
Glucose-Capillary: 207 mg/dL — ABNORMAL HIGH (ref 70–99)
Glucose-Capillary: 212 mg/dL — ABNORMAL HIGH (ref 70–99)
Glucose-Capillary: 242 mg/dL — ABNORMAL HIGH (ref 70–99)

## 2020-12-22 LAB — MAGNESIUM: Magnesium: 2.1 mg/dL (ref 1.7–2.4)

## 2020-12-22 MED ORDER — GUAIFENESIN ER 600 MG PO TB12
ORAL_TABLET | ORAL | Status: AC
  Filled 2020-12-22: qty 1

## 2020-12-22 MED ORDER — HEPARIN SODIUM (PORCINE) 5000 UNIT/ML IJ SOLN
INTRAMUSCULAR | Status: AC
  Filled 2020-12-22: qty 1

## 2020-12-22 MED ORDER — HEPARIN SODIUM (PORCINE) 5000 UNIT/ML IJ SOLN
5000.0000 [IU] | Freq: Three times a day (TID) | INTRAMUSCULAR | Status: DC
  Administered 2020-12-22 – 2021-01-05 (×40): 5000 [IU] via SUBCUTANEOUS
  Filled 2020-12-22 (×39): qty 1

## 2020-12-22 MED ORDER — LIDOCAINE 5 % EX PTCH
MEDICATED_PATCH | CUTANEOUS | Status: AC
  Filled 2020-12-22: qty 1

## 2020-12-22 MED ORDER — GUAIFENESIN ER 600 MG PO TB12
600.0000 mg | ORAL_TABLET | Freq: Two times a day (BID) | ORAL | Status: DC | PRN
  Administered 2020-12-22 – 2021-01-01 (×3): 600 mg via ORAL
  Filled 2020-12-22 (×2): qty 1

## 2020-12-22 MED ORDER — FUROSEMIDE 10 MG/ML IJ SOLN
40.0000 mg | Freq: Once | INTRAMUSCULAR | Status: AC
  Administered 2020-12-22: 40 mg via INTRAVENOUS

## 2020-12-22 MED ORDER — INSULIN DETEMIR 100 UNIT/ML ~~LOC~~ SOLN
5.0000 [IU] | Freq: Every day | SUBCUTANEOUS | Status: DC
  Administered 2020-12-22 – 2020-12-23 (×2): 5 [IU] via SUBCUTANEOUS
  Filled 2020-12-22 (×2): qty 0.05

## 2020-12-22 MED ORDER — ACETAMINOPHEN 325 MG PO TABS
650.0000 mg | ORAL_TABLET | Freq: Four times a day (QID) | ORAL | Status: AC
  Administered 2020-12-22 – 2020-12-25 (×11): 650 mg via ORAL
  Filled 2020-12-22 (×9): qty 2

## 2020-12-22 MED ORDER — FUROSEMIDE 10 MG/ML IJ SOLN
INTRAMUSCULAR | Status: AC
  Filled 2020-12-22: qty 4

## 2020-12-22 MED ORDER — LIDOCAINE 5 % EX PTCH
1.0000 | MEDICATED_PATCH | Freq: Every day | CUTANEOUS | Status: DC
  Administered 2020-12-22 – 2021-01-07 (×14): 1 via TRANSDERMAL
  Filled 2020-12-22 (×15): qty 1

## 2020-12-22 MED ORDER — REVEFENACIN 175 MCG/3ML IN SOLN
175.0000 ug | Freq: Every day | RESPIRATORY_TRACT | Status: DC
  Administered 2020-12-22 – 2021-01-08 (×17): 175 ug via RESPIRATORY_TRACT
  Filled 2020-12-22 (×18): qty 3

## 2020-12-22 NOTE — Progress Notes (Signed)
Physical Therapy Treatment Patient Details Name: Jillian Leblanc MRN: [id number] DOB: [birth year] Today's Date: 12/22/2020   History of Present Illness 75 y/o female admitted 11/25 with severe sepsis with UTI and possible L5-S1 abscess/osteo. Pt with Afib with RVR, hypotension and AMS with transfer to ICU 11/27. Not currently sx candidate. PMHx:  DM, PPM, neuropathy, falls, L5 fx Oct '22    PT Comments    Pt with flat affect and decreased cognition and processing. Pt reports pain in hips and that sitting EOB was helpful. Pt with very limited standing tolerance and unable to weight shift to step toward [facility] or chair. Pt remains very high fall risk as well as debilitated and unable to care for herself with SNF appropriate. Will continue to follow and attempt increased function.   HR 72 98% RA 130/63 (82)    Recommendations for follow up therapy are one component of a multi-disciplinary discharge planning process, led by the attending physician.  Recommendations may be updated based on patient status, additional functional criteria and insurance authorization.  Follow Up Recommendations  Skilled nursing-short term rehab (<3 hours/day)     Assistance Recommended at Discharge Frequent or constant Supervision/Assistance  Equipment Recommendations  None recommended by PT    Recommendations for Other Services       Precautions / Restrictions Precautions Precautions: Fall;Back Precaution Comments: Back for comfort     Mobility  Bed Mobility Overal bed mobility: Needs Assistance Bed Mobility: Rolling;Sidelying to Sit;Sit to Supine Rolling: Mod assist Sidelying to sit: Mod assist Supine to sit: Max assist     General bed mobility comments: rolling with cues and assist to bend knee and reach for rail with assist of pad. Increased time and effort to rise to sitting from side. Return to bed with max assist and total +2 to slide toward [facility]    Transfers Overall transfer level: Needs  assistance   Transfers: Sit to/from Stand Sit to Stand: Mod assist           General transfer comment: mod assist to rise from bed with left knee blocked x 2 trials. pt unable fully stand upright or shift feet to pivot or step toward [facility] despite physical assist for weight shifting. pt returned to bed    Ambulation/Gait               General Gait Details: unable   Stairs             Wheelchair Mobility    Modified Rankin (Stroke Patients Only)       Balance Overall balance assessment: Needs assistance Sitting-balance support: Feet supported Sitting balance-Leahy Scale: Fair Sitting balance - Comments: EOB with minguard assist   Standing balance support: Bilateral upper extremity supported;During functional activity Standing balance-Leahy Scale: Poor Standing balance comment: bil UE on RW with knee blocked                            Cognition Arousal/Alertness: Awake/alert Behavior During Therapy: Flat affect Overall Cognitive Status: No family/caregiver present to determine baseline cognitive functioning Area of Impairment: Orientation;Attention;Memory;Following commands;Awareness                 Orientation Level: Disoriented to;Time Current Attention Level: Focused Memory: Decreased short-term memory Following Commands: Follows one step commands inconsistently;Follows one step commands with increased time Safety/Judgement: Decreased awareness of deficits;Decreased awareness of safety     General Comments: pt with very slow processing and at times does  not respond to questions        Exercises      General Comments        Pertinent Vitals/Pain Pain Score: 6  Facial Expression: Tense Body Movements: Restlessness Muscle Tension: Tense, rigid Compliance with ventilator (intubated pts.): N/A Vocalization (extubated pts.): Sighing, moaning CPOT Total: 5 Pain Location: bil hips Pain Descriptors / Indicators:  Aching;Tender Pain Intervention(s): Limited activity within patient's tolerance;Repositioned;Monitored during session    Home Living                          Prior Function            PT Goals (current goals can now be found in the care plan section) Progress towards PT goals: Progressing toward goals    Frequency    Min 2X/week      PT Plan Current plan remains appropriate;Frequency needs to be updated    Co-evaluation              AM-PAC PT "6 Clicks" Mobility   Outcome Measure  Help needed turning from your back to your side while in a flat bed without using bedrails?: A Lot Help needed moving from lying on your back to sitting on the side of a flat bed without using bedrails?: A Lot Help needed moving to and from a bed to a chair (including a wheelchair)?: Total Help needed standing up from a chair using your arms (e.g., wheelchair or bedside chair)?: Total Help needed to walk in hospital room?: Total Help needed climbing 3-5 steps with a railing? : Total 6 Click Score: 8    End of Session Equipment Utilized During Treatment: Gait belt Activity Tolerance: Patient limited by fatigue Patient left: in bed;with call bell/phone within reach;with bed alarm set;with nursing/sitter in room Nurse Communication: Mobility status;Need for lift equipment PT Visit Diagnosis: Other abnormalities of gait and mobility (R26.89);Muscle weakness (generalized) (M62.81)     Time: 2778-2423 PT Time Calculation (min) (ACUTE ONLY): 23 min  Charges:  $Therapeutic Activity: 23-37 mins                     Madelaine Whipple P, PT Acute Rehabilitation Services Pager: [phone] Office: [phone]    Jillian Leblanc B Jillian Leblanc 12/22/2020, 10:02 AM

## 2020-12-22 NOTE — Progress Notes (Signed)
NAME:  Jillian Leblanc, MRN:  [id number], DOB:  16-May-1945, LOS: 4 ADMISSION DATE:  12/18/2020, CONSULTATION DATE:  12/20/20 REFERRING MD:  Imogene Burn, Hospitalist, CHIEF COMPLAINT:  Back pain    History of Present Illness:  75 yo woman who presented with back pain after falls, developed sepsis, Ecoli from Blood cultures, now with hypotension and afib with RVR, fever.  Admitted 11/25 pm with SOB, non productive cough, subjective fever, fatigue, muscle aches, and back pain (hadn't completely resolved after prior recent discharge).   Increasing confusion and intermittent somnolence at home, poor po intake. ICU asked to consult for ongoing hypotension, tachypnea, afib with RVR in the setting of sepsis with ecoli, osteomyelitis.   Per notes, rapid response at around 7pm, patient was tachypneic and using accesory muscles at the time. Cards called for afib, rec amio as needed, advised against further beta blocker or CCB.   Fever increased at around 7pm 11/26  Continued to have dyspnea and severe back pain overnight.  Fentanyl does not appear to be very helpful, last received at around 10pm.  Fevers increasing since 7pm, was given tylenol prior to my arrival.    Neurosurgery has seen her regarding the compression fracture and it has been managed non operatively during prior admissions.    Recent hx of AKI and hydronephrosis.    Cr actually improved from yesterday to tonight.   Hypoalbuminemia UTI  CT - no hydro   Musculoskeletal: A compression fracture of L5 is again identified, increased from 35% on 11/22/2020 to 50% today, now with irregularity of the L5 vertebral body. There are foci of gas in the L4-S1 paravertebral regions with small amount of gas posterior along the spinal canal at L5-S1. No discrete focal collection is identified on this noncontrast study. Disc protrusion at L5-S1 is again noted.   A healing RIGHT L1 transverse process fracture remote T12 compression fracture is  present.  Pertinent  Medical History  DM2 Interstitial cystitis Hypothyroidism Chronic pain Vertebral compression fracture HFpEF  Significant Hospital Events: Including procedures, antibiotic start and stop dates in addition to other pertinent events   11/25 admitted 11/27 moved to ICU  Interim History / Subjective:  This morning she has back pain working with PT. Remains on low-dose phenylephrine.   Objective   Blood pressure 111/87, pulse 70, temperature 98.6 F (37 C), temperature source Oral, resp. rate 20, height 5' (1.524 m), weight 95.5 kg, SpO2 100 %.    FiO2 (%):  [28 %] 28 %   Intake/Output Summary (Last 24 hours) at 12/22/2020 0855 Last data filed at 12/22/2020 0700 Gross per 24 hour  Intake 1473.01 ml  Output 1920 ml  Net -446.99 ml    Filed Weights   12/20/20 0159 12/21/20 0542 12/22/20 0359  Weight: 97.3 kg 97.2 kg 95.5 kg    Examination: General: chronically ill appearing woman sitting EOB HENT: /AT, eyes anicteric Lungs:  coughing occasionally, no longer wheezing. CTAB posteriorly. Cardiovascular: S1S2, RRR Abdomen:  obese, ND Extremities: mild edema Neuro: awake, alert, very weak trying to stand with PT Derm: warm, dry, no rashes  Na+  128 BUN 21 Cr 1.11 WBC 5.9 H/H 9.1/27 Platelets 118 Fibrinogen 693 INR 1.1   VQ scan- no PE   Resolved Hospital Problem list     Assessment & Plan:  75 yo woman with hx of DM2, cystitis, here with sepsis, ecoli bacteremia, UTI, vertebral compression fracture, osteomyelitis.   Septic shock due ot E. Coli bacteremia, UTI, septic discitis/OM L5-S1 -Con't  ceftriaxone per ID. Will need 6 weeks and PICC line. Anticipate she will need SNF. -Appreciate NS input-- not a candidate for operative intervention currently and myelogram may pose a higher disk of spreading infection. -peripheral neosynephrine to maintain MAP >65  Acute respiratory failure with hypoxia Wheezing better with bronchodilators, concern  for COPD-- mosaicism diffusely on lung images of CT T-spine on 11/22/20. She denies smoking history of asthma history. Suspect acute pulmonary edema -lasix 40mg  today -Con't yupelri & brovana -Con't duonebs PRN -Con't solumedrol 40mg  daily -adding guaifenesin for cough  Hyponatremia, worsening CKD 2; AKI resolved -renally dose meds, avoid nephrotoxic meds -lasix -strict I/O  Acute back pain 2/2 discitis & OM, previous compression fracture -con't PTA elavil  -adding scheduled tylenol -adding lidocaine patch -con't PRN opiates  -con't gabapentin -PT, OT  Acute metabolic encephalopathy, improvied-- multifactorial- hyponatremia, uncontrolled pain, pain meds, sepsis -avoid deliriogenic meds -optimize pain control -frequent reorientation  Afib with RVR , new onset, likely 2/2 sepsis. Back in NSR. -amiodarone until off pressors, then stop -tele monitoring -hold on starting heparin unless it recurs. Worry long-term about AC for her given frequent falls per daughter's report.  Acute thrombocytopenia, suspect this is due to antibiotics or sepsis> improving. No DIC. 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  Best Practice (right click and "Reselect all SmartList Selections" daily)   Diet/type: regular if passes bedside swallow  DVT prophylaxis: prophylactic heparin  GI prophylaxis: N/A Lines: N/A Foley:  Yes, and it is still needed Code Status:  full code Last date of multidisciplinary goals of care discussion [  ]  Labs   CBC: Recent Labs  Lab 12/18/20 1949 12/19/20 0208 12/19/20 0237 12/19/20 2337 12/20/20 0429 12/21/20 0134 12/22/20 0017   WBC 5.4  --  4.4 2.8* 4.1 3.8* 5.9  NEUTROABS 4.8  --  4.0  --  3.7 2.7 5.2  HGB 8.9*   < > 8.6* 7.6* 8.1* 8.6* 9.1*  HCT 27.3*   < > 26.5* 22.0* 24.2* 24.6* 27.0*  MCV 88.6  --  91.4 85.3 85.5 84.8 86.5  PLT 69*  --  57* 48* 54* 68* 120*  118*   < > = values in this interval not displayed.     Basic Metabolic Panel: Recent Labs  Lab 12/19/20 0237 12/19/20 2337 12/20/20 0429 12/21/20 0134 12/22/20 0017  NA 126* 129* 129* 127* 128*  K 3.8 3.4* 3.6 3.9 5.0  CL 96* 97* 98 98 99  CO2 17* 21* 18* 21* 20*  GLUCOSE 99 78 85 127* 218*  BUN 35* 28* 25* 17 21  CREATININE 1.98* 1.55* 1.56* 1.07* 1.11*  CALCIUM 6.8* 7.3* 7.4* 7.6* 8.1*  MG 1.5* 1.7  --   --  2.1    GFR: Estimated Creatinine Clearance: 46.7 mL/min (A) (by C-G formula based on SCr of 1.11 mg/dL (H)). Recent Labs  Lab 12/18/20 1955 12/18/20 2148 12/19/20 0237 12/19/20 2337 12/20/20 0429 12/21/20 0134 12/22/20 0017  PROCALCITON  --   --  16.24  --   --   --   --   WBC  --   --  4.4 2.8* 4.1 3.8* 5.9  LATICACIDVEN 2.1* 1.8  --   --  1.5  --   --  Liver Function Tests: Recent Labs  Lab 12/19/20 0237 12/19/20 2337 12/20/20 0429 12/21/20 0134 12/22/20 0017  AST 39 30 29 33 25  ALT 27 23 23 22 22   ALKPHOS 172* 208* 208* 243* 257*  BILITOT 1.8* 1.5* 1.7* 1.1 0.8  PROT 5.7* 5.3* 5.5* 5.8* 6.3*  ALBUMIN 2.2* 1.8* 1.9* 1.8* 1.9*    No results for input(s): LIPASE, AMYLASE in the last 168 hours. No results for input(s): AMMONIA in the last 168 hours.  ABG    Component Value Date/Time   PHART 7.384 12/19/2020 0208   PCO2ART 30.6 (L) 12/19/2020 0208   PO2ART 83 12/19/2020 0208   HCO3 18.3 (L) 12/19/2020 0208   TCO2 19 (L) 12/19/2020 0208   ACIDBASEDEF 6.0 (H) 12/19/2020 0208   O2SAT 96.0 12/19/2020 0208     This patient is critically ill with multiple organ system failure which requires frequent high complexity decision making, assessment, support, evaluation, and titration of therapies. This  was completed through the application of advanced monitoring technologies and extensive interpretation of multiple databases. During this encounter critical care time was devoted to patient care services described in this note for 40 minutes.  Julian Hy, DO 12/22/20 9:40 AM [facility]

## 2020-12-22 NOTE — Progress Notes (Signed)
Progress Note  Patient Name: Jillian Leblanc Date of Encounter: 12/22/2020  [facility] Cardiologist: None  EP: Dr. Curt Bears  Subjective   She converted to sinus rhythm around 1 AM this morning. She is interactive and endorses significant back pain, is tearful.  Inpatient Medications    Scheduled Meds:  acetaminophen  650 mg Oral Once   amitriptyline  100 mg Oral QHS   arformoterol  15 mcg Nebulization BID   calcium carbonate  800 mg of elemental calcium Oral BID   calcium-vitamin D  0.5 tablet Oral BID   Chlorhexidine Gluconate Cloth  6 each Topical Q0600   fentaNYL (SUBLIMAZE) injection  100 mcg Intravenous Once   gabapentin  300 mg Oral BID   insulin aspart  1-3 Units Subcutaneous Q4H   ipratropium-albuterol  3 mL Nebulization Q6H   mouth rinse  15 mL Mouth Rinse BID   methylPREDNISolone (SOLU-MEDROL) injection  40 mg Intravenous Q24H   mupirocin ointment  1 application Nasal BID   pramipexole  1.5 mg Oral TID   revefenacin  175 mcg Nebulization Daily   Continuous Infusions:  sodium chloride Stopped (12/21/20 1240)   sodium chloride     amiodarone 30 mg/hr (12/22/20 0700)    ceFAZolin (ANCEF) IV Stopped (12/22/20 0251)   phenylephrine (NEO-SYNEPHRINE) Adult infusion Stopped (12/22/20 0605)   PRN Meds: sodium chloride, acetaminophen **OR** acetaminophen, docusate sodium, fentaNYL (SUBLIMAZE) injection, hydrOXYzine, ipratropium-albuterol, lip balm, methocarbamol, naLOXone (NARCAN)  injection, oxyCODONE, polyethylene glycol   Vital Signs    Vitals:   12/22/20 0747 12/22/20 0750 12/22/20 0752 12/22/20 0808  BP: 111/87     Pulse: 70     Resp: 20     Temp:    98.6 F (37 C)  TempSrc:    Oral  SpO2: 100% 100% 100%   Weight:      Height:        Intake/Output Summary (Last 24 hours) at 12/22/2020 0855 Last data filed at 12/22/2020 0700 Gross per 24 hour  Intake 1473.01 ml  Output 1920 ml  Net -446.99 ml   Last 3 Weights 12/22/2020 12/21/2020 12/20/2020   Weight (lbs) 210 lb 8.6 oz 214 lb 4.6 oz 214 lb 8.1 oz  Weight (kg) 95.5 kg 97.2 kg 97.3 kg      Telemetry    Converted to sinus rhythm around 1 AM, now NSR, rare paced beats - Personally Reviewed  ECG    11/26 afib RVR at 156 bpm. Repeat ordered this AM - Personally Reviewed  Physical Exam   GEN: Well nourished, well developed, on room air NECK: No JVD CARDIAC: regular rhythm, normal S1 and S2, no rubs or gallops. No murmur. VASCULAR: Radial pulses 2+ bilaterally.  RESPIRATORY:  Coarse diffusely but no wheezing appreciated ABDOMEN: Soft, non-tender, non-distended MUSCULOSKELETAL:  Moves all 4 limbs independently SKIN: Warm and dry, trivial BL LE edema Psych: interactive, answers question, tearful and uncomfortable  Labs    High Sensitivity Troponin:  No results for input(s): TROPONINIHS in the last 720 hours.   Chemistry Recent Labs  Lab 12/19/20 0237 12/19/20 2337 12/20/20 0429 12/21/20 0134 12/22/20 0017  NA 126* 129* 129* 127* 128*  K 3.8 3.4* 3.6 3.9 5.0  CL 96* 97* 98 98 99  CO2 17* 21* 18* 21* 20*  GLUCOSE 99 78 85 127* 218*  BUN 35* 28* 25* 17 21  CREATININE 1.98* 1.55* 1.56* 1.07* 1.11*  CALCIUM 6.8* 7.3* 7.4* 7.6* 8.1*  MG 1.5* 1.7  --   --  2.1  PROT 5.7* 5.3* 5.5* 5.8* 6.3*  ALBUMIN 2.2* 1.8* 1.9* 1.8* 1.9*  AST 39 30 29 33 25  ALT 27 23 23 22 22   ALKPHOS 172* 208* 208* 243* 257*  BILITOT 1.8* 1.5* 1.7* 1.1 0.8  GFRNONAA 26* 35* 35* 55* 52*  ANIONGAP 13 11 13 8 9     Lipids No results for input(s): CHOL, TRIG, HDL, LABVLDL, LDLCALC, CHOLHDL in the last 168 hours.  Hematology Recent Labs  Lab 12/20/20 0429 12/21/20 0134 12/22/20 0017  WBC 4.1 3.8* 5.9  RBC 2.83* 2.90* 3.12*  HGB 8.1* 8.6* 9.1*  HCT 24.2* 24.6* 27.0*  MCV 85.5 84.8 86.5  MCH 28.6 29.7 29.2  MCHC 33.5 35.0 33.7  RDW 15.2 15.5 16.2*  PLT 54* 68* 120*  118*   Thyroid  Recent Labs  Lab 12/19/20 0237 12/19/20 1235  TSH 5.442*  --   FREET4  --  1.28*    BNP Recent  Labs  Lab 12/19/20 0237  BNP 199.7*    DDimer  Recent Labs  Lab 12/19/20 1235 12/22/20 0017  DDIMER 8.61* 4.48*     Radiology    NM Pulmonary Perfusion  Result Date: 12/21/2020 CLINICAL DATA:  Hypotension, tachypnea, atrial fibrillation with rapid ventricular response in setting of sepsis, question pulmonary embolism EXAM: NUCLEAR MEDICINE PERFUSION LUNG SCAN TECHNIQUE: Perfusion images were obtained in multiple projections after intravenous injection of radiopharmaceutical. Ventilation scans intentionally deferred if perfusion scan and chest x-ray adequate for interpretation during COVID 19 epidemic. RADIOPHARMACEUTICALS:  4.4 mCi Tc-61m MAA IV COMPARISON:  None Correlation: Chest radiograph 12/21/2020 FINDINGS: Normal perfusion lung scan. No perfusion defects. IMPRESSION: Normal perfusion lung scan. Electronically Signed   By: Lavonia Dana M.D.   On: 12/21/2020 11:43   DG CHEST PORT 1 VIEW  Result Date: 12/21/2020 CLINICAL DATA:  Sepsis, wheezing, shortness of breath. EXAM: PORTABLE CHEST 1 VIEW COMPARISON:  12/19/2020 and CT chest 09/29/2020. FINDINGS: [name] slightly rotated. Trachea is midline. Heart is enlarged, stable. Thoracic aorta is calcified. Pacemaker lead tips are in the right atrium and right ventricle. Lungs are somewhat low in volume with mild pulmonary vascular prominence. No pleural fluid. Right shoulder arthroplasty. Gaseous distension of the stomach, incompletely imaged. IMPRESSION: 1. Mild pulmonary vascular prominence may be due to low lung volumes. Difficult to exclude mild edema. 2.  Aortic atherosclerosis (ICD10-I70.0). Electronically Signed   By: Lorin Picket M.D.   On: 12/21/2020 08:06   ECHOCARDIOGRAM LIMITED  Result Date: 12/21/2020    ECHOCARDIOGRAM LIMITED REPORT   Patient Name:   Jillian Leblanc Date of Exam: 12/21/2020 Medical Rec #:  [id number]      Height:       60.0 in Accession #:    [id number]     Weight:       214.3 lb Date of Birth:  [birth year]       BSA:          1.922 m Patient Age:    76 years       BP:           99/65 mmHg Patient Gender: F              HR:           123 bpm. Exam Location:  Inpatient Procedure: Limited Echo, Color Doppler and Cardiac Doppler Indications:    Bacteremia  History:        Patient has prior history of Echocardiogram examinations, most  recent 11/24/2020. CHF, Arrythmias:Dysrhythmia; Risk                 Factors:Diabetes.  Sonographer:    Gertie Fey MHA, RDMS, RVT, RDCS Referring Phys: [id number] CORNELIUS N [place] IMPRESSIONS  1. LVEF appears moderately depressed. Endocardium is not well seen in all segments. WOuld recomm limited echo with Definity to confirm wall motion and LVEF . Left ventricular diastolic parameters are indeterminate.  2. Right ventricular systolic function is normal. The right ventricular size is mildly enlarged.  3. Right atrial size was mildly dilated.  4. Trivial mitral valve regurgitation.  5. Tricuspid valve regurgitation is moderate.  6. The aortic valve is tricuspid. Aortic valve regurgitation is not visualized. Aortic valve sclerosis is present, with no evidence of aortic valve stenosis. FINDINGS  Left Ventricle: LVEF appears moderately depressed. Endocardium is not well seen in all segments. WOuld recomm limited echo with Definity to confirm wall motion and LVEF. The left ventricular internal cavity size was normal in size. Left ventricular diastolic parameters are indeterminate. Right Ventricle: The right ventricular size is mildly enlarged. Right vetricular wall thickness was not assessed. Right ventricular systolic function is normal. Left Atrium: Left atrial size was normal in size. Right Atrium: Right atrial size was mildly dilated. Pericardium: There is no evidence of pericardial effusion. Mitral Valve: There is mild thickening of the mitral valve leaflet(s). Mild to moderate mitral annular calcification. Trivial mitral valve regurgitation. Tricuspid Valve: The tricuspid  valve is normal in structure. Tricuspid valve regurgitation is moderate. Aortic Valve: The aortic valve is tricuspid. Aortic valve regurgitation is not visualized. Aortic valve sclerosis is present, with no evidence of aortic valve stenosis. Pulmonic Valve: The pulmonic valve was grossly normal. Pulmonic valve regurgitation is not visualized. Additional Comments: A device lead is visualized. TRICUSPID VALVE TR Peak grad:   40.4 mmHg TR Vmax:        318.00 cm/s Dietrich Pates MD Electronically signed by Dietrich Pates MD Signature Date/Time: 12/21/2020/5:00:32 PM    Final     Cardiac Studies   Echo 11/24/20  1. Left ventricular ejection fraction, by estimation, is 60 to 65%. The  left ventricle has normal function. The left ventricle has no regional  wall motion abnormalities. Left ventricular diastolic parameters are  consistent with Grade I diastolic  dysfunction (impaired relaxation). Elevated left atrial pressure.   2. Right ventricular systolic function is normal. The right ventricular  size is normal.   3. No evidence of mitral valve regurgitation.   4. The aortic valve is tricuspid. Aortic valve regurgitation is not  visualized. Mild aortic valve sclerosis is present, with no evidence of  aortic valve stenosis.   Patient Profile     75 y.o. female with PMH type II diabetes, chronic diastolic heart failure, chronic anemia whom we are following for atrial fibrillation in the setting of severe sepsis.  Assessment & Plan    Atrial fibrillation with RVR: -likely 2/2 severe sepsis -may also be 2/2 thyroid, defer to primary team -limited treatment options due to hypotension -CHA2DS2/VAS Stroke Risk Points=4.  -started on amiodarone given blood pressure, has now converted to sinus rhythm -has chronic anemia, Hgb 9.1 currently -thrombocytopenic with Plt at 120k -given her blood counts, per initial consult anticoagulation was not started due to risk of bleeding. Continue to monitor. With  chadsvasc=4, would re-assess for anticoagulation as an outpatient or once she is recovered from her acute illness. Would also involve neurosurgery prior to starting anticoagulation given location of her  inflammation/epidural gas. Overall anticipate she will not be able to be considered for anticoagulation unless her clinical picture improves significantly.  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 Her lab work is concerning. Her atrial fibrillation has now converted to sinus on amiodarone.  Given that the majority of her issues are non-cardiac, we will sign off for now. When she is nearing discharge, or if her condition changes, please let us know and we will assist with discharge medical management.  I would continue her on IV amiodarone for now, can be changed to oral in ~48 hours.  [facility] will sign off at this time, but please contact us with any questions in the interim, and let us know when she is nearing discharge.  For questions or updates, please contact [place] Please consult [email or web address] for contact info under        Signed, Buford Dresser, MD  12/22/2020, 8:55 AM

## 2020-12-22 NOTE — Plan of Care (Signed)
  Problem: Elimination: Goal: Will not experience complications related to urinary retention Outcome: Progressing   Problem: Safety: Goal: Ability to remain free from injury will improve Outcome: Progressing   Problem: Coping: Goal: Level of anxiety will decrease Outcome: Not Progressing   Problem: Pain Managment: Goal: General experience of comfort will improve Outcome: Not Progressing

## 2020-12-22 NOTE — Progress Notes (Signed)
Subjective:  Still having significant back pain   Antibiotics:  Anti-infectives (From admission, onward)    Start     Dose/Rate Route Frequency Ordered Stop   12/21/20 1800  ceFAZolin (ANCEF) IVPB 2g/100 mL premix        2 g 200 mL/hr over 30 Minutes Intravenous Every 8 hours 12/21/20 1216     12/20/20 1800  cefTRIAXone (ROCEPHIN) 2 g in sodium chloride 0.9 % 100 mL IVPB  Status:  Discontinued        2 g 200 mL/hr over 30 Minutes Intravenous Every 24 hours 12/20/20 0920 12/21/20 1216   12/20/20 0100  meropenem (MERREM) 1 g in sodium chloride 0.9 % 100 mL IVPB  Status:  Discontinued        1 g 200 mL/hr over 30 Minutes Intravenous Every 8 hours 12/20/20 0004 12/20/20 0920   12/19/20 2000  ceFEPIme (MAXIPIME) 2 g in sodium chloride 0.9 % 100 mL IVPB  Status:  Discontinued        2 g 200 mL/hr over 30 Minutes Intravenous Every 24 hours 12/19/20 0057 12/19/20 1023   12/19/20 2000  cefTRIAXone (ROCEPHIN) 2 g in sodium chloride 0.9 % 100 mL IVPB  Status:  Discontinued        2 g 200 mL/hr over 30 Minutes Intravenous Every 24 hours 12/19/20 1023 12/20/20 0004   12/19/20 0057  vancomycin variable dose per unstable renal function (pharmacist dosing)  Status:  Discontinued         Does not apply See admin instructions 12/19/20 0057 12/19/20 1023   12/18/20 2000  ceFEPIme (MAXIPIME) 2 g in sodium chloride 0.9 % 100 mL IVPB        2 g 200 mL/hr over 30 Minutes Intravenous  Once 12/18/20 1955 12/18/20 2050   12/18/20 2000  metroNIDAZOLE (FLAGYL) IVPB 500 mg        500 mg 100 mL/hr over 60 Minutes Intravenous  Once 12/18/20 1955 12/18/20 2210   12/18/20 2000  vancomycin (VANCOREADY) IVPB 1750 mg/350 mL        1,750 mg 175 mL/hr over 120 Minutes Intravenous  Once 12/18/20 1955 12/19/20 0045       Medications: Scheduled Meds:  acetaminophen  650 mg Oral Once   acetaminophen  650 mg Oral Q6H   amitriptyline  100 mg Oral QHS   arformoterol  15 mcg Nebulization BID   calcium  carbonate  800 mg of elemental calcium Oral BID   calcium-vitamin D  0.5 tablet Oral BID   Chlorhexidine Gluconate Cloth  6 each Topical Q0600   fentaNYL (SUBLIMAZE) injection  100 mcg Intravenous Once   furosemide  40 mg Intravenous Once   gabapentin  300 mg Oral BID   heparin injection (subcutaneous)  5,000 Units Subcutaneous Q8H   insulin aspart  1-3 Units Subcutaneous Q4H   insulin detemir  5 Units Subcutaneous Daily   ipratropium-albuterol  3 mL Nebulization Q6H   lidocaine  1 patch Transdermal Daily   mouth rinse  15 mL Mouth Rinse BID   methylPREDNISolone (SOLU-MEDROL) injection  40 mg Intravenous Q24H   mupirocin ointment  1 application Nasal BID   pramipexole  1.5 mg Oral TID   revefenacin  175 mcg Nebulization Daily   Continuous Infusions:  sodium chloride Stopped (12/21/20 1240)   sodium chloride     amiodarone 30 mg/hr (12/22/20 0700)    ceFAZolin (ANCEF) IV 2 g (12/22/20 1106)   phenylephrine (NEO-SYNEPHRINE)  Adult infusion Stopped (12/22/20 0605)   PRN Meds:.sodium chloride, acetaminophen **OR** acetaminophen, docusate sodium, fentaNYL (SUBLIMAZE) injection, guaiFENesin, hydrOXYzine, ipratropium-albuterol, lip balm, methocarbamol, naLOXone (NARCAN)  injection, oxyCODONE, polyethylene glycol    Objective: Weight change: -1.7 kg  Intake/Output Summary (Last 24 hours) at 12/22/2020 1112 Last data filed at 12/22/2020 0700 Gross per 24 hour  Intake 1473.01 ml  Output 1920 ml  Net -446.99 ml    Blood pressure 130/63, pulse 70, temperature 98.6 F (37 C), temperature source Oral, resp. rate 13, height 5' (1.524 m), weight 95.5 kg, SpO2 99 %. Temp:  [97.9 F (36.6 C)-99.3 F (37.4 C)] 98.6 F (37 C) (11/29 0808) Pulse Rate:  [46-169] 70 (11/29 0915) Resp:  [9-26] 13 (11/29 0915) BP: (89-132)/(48-87) 130/63 (11/29 0900) SpO2:  [96 %-100 %] 99 % (11/29 0958) FiO2 (%):  [28 %] 28 % (11/29 0752) Weight:  [95.5 kg] 95.5 kg (11/29 0359)  Physical Exam: Physical  Exam Constitutional:      General: She is not in acute distress.    Appearance: She is well-developed. She is obese.  HENT:     Head: Normocephalic and atraumatic.     Right Ear: External ear normal.     Left Ear: External ear normal.     Mouth/Throat:     Pharynx: No oropharyngeal exudate.  Eyes:     General: No scleral icterus.    Extraocular Movements: Extraocular movements intact.     Conjunctiva/sclera: Conjunctivae normal.  Cardiovascular:     Rate and Rhythm: Normal rate and regular rhythm.     Heart sounds: Normal heart sounds. No murmur heard.   No friction rub. No gallop.  Pulmonary:     Effort: Pulmonary effort is normal. No respiratory distress.     Breath sounds: Normal breath sounds. No stridor. No wheezing, rhonchi or rales.  Abdominal:     General: Bowel sounds are normal. There is no distension.     Palpations: Abdomen is soft.     Tenderness: There is no abdominal tenderness. There is no rebound.  Musculoskeletal:        General: No tenderness. Normal range of motion.  Lymphadenopathy:     Cervical: No cervical adenopathy.  Skin:    General: Skin is warm.     Coloration: Skin is not pale.     Findings: No erythema or rash.  Neurological:     General: No focal deficit present.     Motor: Weakness present. No abnormal muscle tone.  Psychiatric:        Mood and Affect: Mood normal.        Behavior: Behavior normal.        Judgment: Judgment normal.     CBC:    BMET Recent Labs    12/21/20 0134 12/22/20 0017  NA 127* 128*  K 3.9 5.0  CL 98 99  CO2 21* 20*  GLUCOSE 127* 218*  BUN 17 21  CREATININE 1.07* 1.11*  CALCIUM 7.6* 8.1*      Liver Panel  Recent Labs    12/21/20 0134 12/22/20 0017  PROT 5.8* 6.3*  ALBUMIN 1.8* 1.9*  AST 33 25  ALT 22 22  ALKPHOS 243* 257*  BILITOT 1.1 0.8        Sedimentation Rate Recent Labs    12/21/20 0134  ESRSEDRATE 95*    C-Reactive Protein Recent Labs    12/20/20 1428 12/21/20 0134   CRP 30.6* 30.0*     Micro Results: Recent Results (from  the past 720 hour(s))  Resp Panel by RT-PCR (Flu A&B, Covid) Nasopharyngeal Swab     Status: None   Collection Time: 11/22/20  8:56 PM   Specimen: Nasopharyngeal Swab; Nasopharyngeal(NP) swabs in vial transport medium  Result Value Ref Range Status   SARS Coronavirus 2 by RT PCR NEGATIVE NEGATIVE Final    Comment: (NOTE) SARS-CoV-2 target nucleic acids are NOT DETECTED.  The SARS-CoV-2 RNA is generally detectable in upper respiratory specimens during the acute phase of infection. The lowest concentration of SARS-CoV-2 viral copies this assay can detect is 138 copies/mL. A negative result does not preclude SARS-Cov-2 infection and should not be used as the sole basis for treatment or other patient management decisions. A negative result may occur with  improper specimen collection/handling, submission of specimen other than nasopharyngeal swab, presence of viral mutation(s) within the areas targeted by this assay, and inadequate number of viral copies(<138 copies/mL). A negative result must be combined with clinical observations, patient history, and epidemiological information. The expected result is Negative.  Fact Sheet for Patients:  EntrepreneurPulse.com.au  Fact Sheet for Healthcare Providers:  IncredibleEmployment.be  This test is no t yet approved or cleared by the [place] FDA and  has been authorized for detection and/or diagnosis of SARS-CoV-2 by FDA under an Emergency Use Authorization (EUA). This EUA will remain  in effect (meaning this test can be used) for the duration of the COVID-19 declaration under Section 564(b)(1) of the Act, 21 U.S.C.section 360bbb-3(b)(1), unless the authorization is terminated  or revoked sooner.       Influenza A by PCR NEGATIVE NEGATIVE Final   Influenza B by PCR NEGATIVE NEGATIVE Final    Comment: (NOTE) The Xpert Xpress  SARS-CoV-2/FLU/RSV plus assay is intended as an aid in the diagnosis of influenza from Nasopharyngeal swab specimens and should not be used as a sole basis for treatment. Nasal washings and aspirates are unacceptable for Xpert Xpress SARS-CoV-2/FLU/RSV testing.  Fact Sheet for Patients: EntrepreneurPulse.com.au  Fact Sheet for Healthcare Providers: IncredibleEmployment.be  This test is not yet approved or cleared by the [place] FDA and has been authorized for detection and/or diagnosis of SARS-CoV-2 by FDA under an Emergency Use Authorization (EUA). This EUA will remain in effect (meaning this test can be used) for the duration of the COVID-19 declaration under Section 564(b)(1) of the Act, 21 U.S.C. section 360bbb-3(b)(1), unless the authorization is terminated or revoked.  Performed at [facility], [address] [place], [place] [address]   Resp Panel by RT-PCR (Flu A&B, Covid) Nasopharyngeal Swab     Status: None   Collection Time: 12/04/20  6:24 PM   Specimen: Nasopharyngeal Swab; Nasopharyngeal(NP) swabs in vial transport medium  Result Value Ref Range Status   SARS Coronavirus 2 by RT PCR NEGATIVE NEGATIVE Final    Comment: (NOTE) SARS-CoV-2 target nucleic acids are NOT DETECTED.  The SARS-CoV-2 RNA is generally detectable in upper respiratory specimens during the acute phase of infection. The lowest concentration of SARS-CoV-2 viral copies this assay can detect is 138 copies/mL. A negative result does not preclude SARS-Cov-2 infection and should not be used as the sole basis for treatment or other patient management decisions. A negative result may occur with  improper specimen collection/handling, submission of specimen other than nasopharyngeal swab, presence of viral mutation(s) within the areas targeted by this assay, and inadequate number of viral copies(<138 copies/mL). A negative result must be combined  with clinical observations, patient history, and epidemiological information.  The expected result is Negative.  Fact Sheet for Patients:  EntrepreneurPulse.com.au  Fact Sheet for Healthcare Providers:  IncredibleEmployment.be  This test is no t yet approved or cleared by the [place] FDA and  has been authorized for detection and/or diagnosis of SARS-CoV-2 by FDA under an Emergency Use Authorization (EUA). This EUA will remain  in effect (meaning this test can be used) for the duration of the COVID-19 declaration under Section 564(b)(1) of the Act, 21 U.S.C.section 360bbb-3(b)(1), unless the authorization is terminated  or revoked sooner.       Influenza A by PCR NEGATIVE NEGATIVE Final   Influenza B by PCR NEGATIVE NEGATIVE Final    Comment: (NOTE) The Xpert Xpress SARS-CoV-2/FLU/RSV plus assay is intended as an aid in the diagnosis of influenza from Nasopharyngeal swab specimens and should not be used as a sole basis for treatment. Nasal washings and aspirates are unacceptable for Xpert Xpress SARS-CoV-2/FLU/RSV testing.  Fact Sheet for Patients: EntrepreneurPulse.com.au  Fact Sheet for Healthcare Providers: IncredibleEmployment.be  This test is not yet approved or cleared by the [place] FDA and has been authorized for detection and/or diagnosis of SARS-CoV-2 by FDA under an Emergency Use Authorization (EUA). This EUA will remain in effect (meaning this test can be used) for the duration of the COVID-19 declaration under Section 564(b)(1) of the Act, 21 U.S.C. section 360bbb-3(b)(1), unless the authorization is terminated or revoked.  Performed at [facility], [place] [address], [place], [place] [address]   C Difficile Quick Screen w PCR reflex     Status: None   Collection Time: 12/06/20  8:01 AM   Specimen: STOOL  Result Value Ref Range Status   C Diff antigen NEGATIVE NEGATIVE  Final   C Diff toxin NEGATIVE NEGATIVE Final   C Diff interpretation No C. difficile detected.  Final    Comment: NEGATIVE Performed at [facility], [place] [address], [place], [place] [address]   Gastrointestinal Panel by PCR , Stool     Status: None   Collection Time: 12/07/20  8:56 AM   Specimen: STOOL  Result Value Ref Range Status   Campylobacter species NOT DETECTED NOT DETECTED Final   Plesimonas shigelloides NOT DETECTED NOT DETECTED Final   Salmonella species NOT DETECTED NOT DETECTED Final   Yersinia enterocolitica NOT DETECTED NOT DETECTED Final   Vibrio species NOT DETECTED NOT DETECTED Final   Vibrio cholerae NOT DETECTED NOT DETECTED Final   Enteroaggregative E coli (EAEC) NOT DETECTED NOT DETECTED Final   Enteropathogenic E coli (EPEC) NOT DETECTED NOT DETECTED Final   Enterotoxigenic E coli (ETEC) NOT DETECTED NOT DETECTED Final   Shiga like toxin producing E coli (STEC) NOT DETECTED NOT DETECTED Final   Shigella/Enteroinvasive E coli (EIEC) NOT DETECTED NOT DETECTED Final   Cryptosporidium NOT DETECTED NOT DETECTED Final   Cyclospora cayetanensis NOT DETECTED NOT DETECTED Final   Entamoeba histolytica NOT DETECTED NOT DETECTED Final   Giardia lamblia NOT DETECTED NOT DETECTED Final   Adenovirus F40/41 NOT DETECTED NOT DETECTED Final   Astrovirus NOT DETECTED NOT DETECTED Final   Norovirus GI/GII NOT DETECTED NOT DETECTED Final   Rotavirus A NOT DETECTED NOT DETECTED Final   Sapovirus (I, II, IV, and V) NOT DETECTED NOT DETECTED Final    Comment: Performed at [facility], [address] [place], [place] [address]  SARS CORONAVIRUS 2 (TAT 6-24 HRS) Nasopharyngeal Nasopharyngeal Swab     Status: None   Collection Time: 12/08/20 10:12  AM   Specimen: Nasopharyngeal Swab  Result Value Ref Range Status   SARS Coronavirus 2 NEGATIVE NEGATIVE Final    Comment: (NOTE) SARS-CoV-2 target nucleic acids are NOT DETECTED.  The SARS-CoV-2 RNA is generally  detectable in upper and lower respiratory specimens during the acute phase of infection. Negative results do not preclude SARS-CoV-2 infection, do not rule out co-infections with other pathogens, and should not be used as the sole basis for treatment or other patient management decisions. Negative results must be combined with clinical observations, patient history, and epidemiological information. The expected result is Negative.  Fact Sheet for Patients: HairSlick.no  Fact Sheet for Healthcare Providers: quierodirigir.com  This test is not yet approved or cleared by the [place] FDA and  has been authorized for detection and/or diagnosis of SARS-CoV-2 by FDA under an Emergency Use Authorization (EUA). This EUA will remain  in effect (meaning this test can be used) for the duration of the COVID-19 declaration under Se ction 564(b)(1) of the Act, 21 U.S.C. section 360bbb-3(b)(1), unless the authorization is terminated or revoked sooner.  Performed at [facility], [address], [place], [place] [address]   Blood Culture (routine x 2)     Status: Abnormal   Collection Time: 12/18/20  7:49 PM   Specimen: BLOOD  Result Value Ref Range Status   Specimen Description BLOOD RIGHT ANTECUBITAL  Final   Special Requests   Final    BOTTLES DRAWN AEROBIC AND ANAEROBIC Blood Culture results may not be optimal due to an excessive volume of blood received in culture bottles   Culture  Setup Time   Final    GRAM NEGATIVE RODS IN BOTH AEROBIC AND ANAEROBIC BOTTLES CRITICAL RESULT CALLED TO, READ BACK BY AND VERIFIED WITH: J,MILLEN PHARMD @0958  12/19/20 EB Performed at [facility], [address] [place], [place] [address]    Culture ESCHERICHIA COLI (A)  Final   Report Status 12/21/2020 FINAL  Final   Organism ID, Bacteria ESCHERICHIA COLI  Final      Susceptibility   Escherichia coli - MIC*    AMPICILLIN 8  SENSITIVE Sensitive     CEFAZOLIN <=4 SENSITIVE Sensitive     CEFEPIME <=0.12 SENSITIVE Sensitive     CEFTAZIDIME <=1 SENSITIVE Sensitive     CEFTRIAXONE <=0.25 SENSITIVE Sensitive     CIPROFLOXACIN <=0.25 SENSITIVE Sensitive     GENTAMICIN <=1 SENSITIVE Sensitive     IMIPENEM <=0.25 SENSITIVE Sensitive     TRIMETH/SULFA <=20 SENSITIVE Sensitive     AMPICILLIN/SULBACTAM <=2 SENSITIVE Sensitive     PIP/TAZO <=4 SENSITIVE Sensitive     * ESCHERICHIA COLI  Blood Culture ID Panel (Reflexed)     Status: Abnormal   Collection Time: 12/18/20  7:49 PM  Result Value Ref Range Status   Enterococcus faecalis NOT DETECTED NOT DETECTED Final   Enterococcus Faecium NOT DETECTED NOT DETECTED Final   Listeria monocytogenes NOT DETECTED NOT DETECTED Final   Staphylococcus species NOT DETECTED NOT DETECTED Final   Staphylococcus aureus (BCID) NOT DETECTED NOT DETECTED Final   Staphylococcus epidermidis NOT DETECTED NOT DETECTED Final   Staphylococcus lugdunensis NOT DETECTED NOT DETECTED Final   Streptococcus species NOT DETECTED NOT DETECTED Final   Streptococcus agalactiae NOT DETECTED NOT DETECTED Final   Streptococcus pneumoniae NOT DETECTED NOT DETECTED Final   Streptococcus pyogenes NOT DETECTED NOT DETECTED Final   A.calcoaceticus-baumannii NOT DETECTED NOT DETECTED Final   Bacteroides fragilis NOT DETECTED NOT DETECTED Final  Enterobacterales DETECTED (A) NOT DETECTED Final    Comment: Enterobacterales represent a large order of gram negative bacteria, not a single organism. CRITICAL RESULT CALLED TO, READ BACK BY AND VERIFIED WITH: J,MILLEN PHARMD @0958  12/19/20 EB    Enterobacter cloacae complex NOT DETECTED NOT DETECTED Final   Escherichia coli DETECTED (A) NOT DETECTED Final    Comment: CRITICAL RESULT CALLED TO, READ BACK BY AND VERIFIED WITH: J,MILLEN PHARMD @0958  12/19/20 EB    Klebsiella aerogenes NOT DETECTED NOT DETECTED Final   Klebsiella oxytoca NOT DETECTED NOT DETECTED  Final   Klebsiella pneumoniae NOT DETECTED NOT DETECTED Final   Proteus species NOT DETECTED NOT DETECTED Final   Salmonella species NOT DETECTED NOT DETECTED Final   Serratia marcescens NOT DETECTED NOT DETECTED Final   Haemophilus influenzae NOT DETECTED NOT DETECTED Final   Neisseria meningitidis NOT DETECTED NOT DETECTED Final   Pseudomonas aeruginosa NOT DETECTED NOT DETECTED Final   Stenotrophomonas maltophilia NOT DETECTED NOT DETECTED Final   Candida albicans NOT DETECTED NOT DETECTED Final   Candida auris NOT DETECTED NOT DETECTED Final   Candida glabrata NOT DETECTED NOT DETECTED Final   Candida krusei NOT DETECTED NOT DETECTED Final   Candida parapsilosis NOT DETECTED NOT DETECTED Final   Candida tropicalis NOT DETECTED NOT DETECTED Final   Cryptococcus neoformans/gattii NOT DETECTED NOT DETECTED Final   CTX-M ESBL NOT DETECTED NOT DETECTED Final   Carbapenem resistance IMP NOT DETECTED NOT DETECTED Final   Carbapenem resistance KPC NOT DETECTED NOT DETECTED Final   Carbapenem resistance NDM NOT DETECTED NOT DETECTED Final   Carbapenem resist OXA 48 LIKE NOT DETECTED NOT DETECTED Final   Carbapenem resistance VIM NOT DETECTED NOT DETECTED Final    Comment: Performed at [facility], [address] [place], [place] [address]  Blood Culture (routine x 2)     Status: Abnormal   Collection Time: 12/18/20  8:10 PM   Specimen: BLOOD  Result Value Ref Range Status   Specimen Description BLOOD SITE NOT SPECIFIED  Final   Special Requests AEROBIC BOTTLE ONLY Blood Culture adequate volume  Final   Culture  Setup Time   Final    GRAM NEGATIVE RODS AEROBIC BOTTLE ONLY CRITICAL VALUE NOTED.  VALUE IS CONSISTENT WITH PREVIOUSLY REPORTED AND CALLED VALUE.    Culture (A)  Final    ESCHERICHIA COLI SUSCEPTIBILITIES PERFORMED ON PREVIOUS CULTURE WITHIN THE LAST 5 DAYS. Performed at [facility], [place] [address], [place], [place] [address]    Report Status 12/21/2020 FINAL   Final  Resp Panel by RT-PCR (Flu A&B, Covid) Nasopharyngeal Swab     Status: None   Collection Time: 12/18/20  9:10 PM   Specimen: Nasopharyngeal Swab; Nasopharyngeal(NP) swabs in vial transport medium  Result Value Ref Range Status   SARS Coronavirus 2 by RT PCR NEGATIVE NEGATIVE Final    Comment: (NOTE) SARS-CoV-2 target nucleic acids are NOT DETECTED.  The SARS-CoV-2 RNA is generally detectable in upper respiratory specimens during the acute phase of infection. The lowest concentration of SARS-CoV-2 viral copies this assay can detect is 138 copies/mL. A negative result does not preclude SARS-Cov-2 infection and should not be used as the sole basis for treatment or other patient management decisions. A negative result may occur with  improper specimen collection/handling, submission of specimen other than nasopharyngeal swab, presence of viral mutation(s) within the areas targeted by this assay, and inadequate number of viral copies(<138 copies/mL). A negative result must be combined with  clinical observations, patient history, and epidemiological information. The expected result is Negative.  Fact Sheet for Patients:  EntrepreneurPulse.com.au  Fact Sheet for Healthcare Providers:  IncredibleEmployment.be  This test is no t yet approved or cleared by the [place] FDA and  has been authorized for detection and/or diagnosis of SARS-CoV-2 by FDA under an Emergency Use Authorization (EUA). This EUA will remain  in effect (meaning this test can be used) for the duration of the COVID-19 declaration under Section 564(b)(1) of the Act, 21 U.S.C.section 360bbb-3(b)(1), unless the authorization is terminated  or revoked sooner.       Influenza A by PCR NEGATIVE NEGATIVE Final   Influenza B by PCR NEGATIVE NEGATIVE Final    Comment: (NOTE) The Xpert Xpress SARS-CoV-2/FLU/RSV plus assay is intended as an aid in the diagnosis of influenza from  Nasopharyngeal swab specimens and should not be used as a sole basis for treatment. Nasal washings and aspirates are unacceptable for Xpert Xpress SARS-CoV-2/FLU/RSV testing.  Fact Sheet for Patients: EntrepreneurPulse.com.au  Fact Sheet for Healthcare Providers: IncredibleEmployment.be  This test is not yet approved or cleared by the [place] FDA and has been authorized for detection and/or diagnosis of SARS-CoV-2 by FDA under an Emergency Use Authorization (EUA). This EUA will remain in effect (meaning this test can be used) for the duration of the COVID-19 declaration under Section 564(b)(1) of the Act, 21 U.S.C. section 360bbb-3(b)(1), unless the authorization is terminated or revoked.  Performed at [facility], [place] [address] [place], [place] [address]   MRSA Next Gen by PCR, Nasal     Status: Abnormal   Collection Time: 12/19/20 12:27 AM   Specimen: Nasal Mucosa; Nasal Swab  Result Value Ref Range Status   MRSA by PCR Next Gen DETECTED (A) NOT DETECTED Final    Comment: RESULT CALLED TO, READ BACK BY AND VERIFIED WITH: C MAS,RN@[id number]  12/19/20 [place] (NOTE) The GeneXpert MRSA Assay (FDA approved for NASAL specimens only), is one component of a comprehensive MRSA colonization surveillance program. It is not intended to diagnose MRSA infection nor to guide or monitor treatment for MRSA infections. Test performance is not FDA approved in patients less than 23 years old. Performed at [facility], [place] [address] [place], [place] [address]   Urine Culture     Status: Abnormal   Collection Time: 12/19/20 12:38 AM   Specimen: In/Out Cath Urine  Result Value Ref Range Status   Specimen Description IN/OUT CATH URINE  Final   Special Requests   Final    NONE Performed at [facility], [place] [address] [place], [place] [address]    Culture 1,000 COLONIES/mL ESCHERICHIA COLI (A)  Final   Report Status 12/21/2020 FINAL   Final   Organism ID, Bacteria ESCHERICHIA COLI (A)  Final      Susceptibility   Escherichia coli - MIC*    AMPICILLIN 8 SENSITIVE Sensitive     CEFAZOLIN <=4 SENSITIVE Sensitive     CEFEPIME <=0.12 SENSITIVE Sensitive     CEFTRIAXONE <=0.25 SENSITIVE Sensitive     CIPROFLOXACIN <=0.25 SENSITIVE Sensitive     GENTAMICIN <=1 SENSITIVE Sensitive     IMIPENEM <=0.25 SENSITIVE Sensitive     NITROFURANTOIN <=16 SENSITIVE Sensitive     TRIMETH/SULFA <=20 SENSITIVE Sensitive     AMPICILLIN/SULBACTAM 4 SENSITIVE Sensitive     PIP/TAZO <=4 SENSITIVE Sensitive     * 1,000 COLONIES/mL ESCHERICHIA COLI  Respiratory (~20 pathogens) panel by PCR  Status: None   Collection Time: 12/19/20  9:07 AM   Specimen: Respiratory  Result Value Ref Range Status   Adenovirus NOT DETECTED NOT DETECTED Final   Coronavirus 229E NOT DETECTED NOT DETECTED Final    Comment: (NOTE) The Coronavirus on the Respiratory Panel, DOES NOT test for the novel  Coronavirus (2019 nCoV)    Coronavirus HKU1 NOT DETECTED NOT DETECTED Final   Coronavirus NL63 NOT DETECTED NOT DETECTED Final   Coronavirus OC43 NOT DETECTED NOT DETECTED Final   Metapneumovirus NOT DETECTED NOT DETECTED Final   Rhinovirus / Enterovirus NOT DETECTED NOT DETECTED Final   Influenza A NOT DETECTED NOT DETECTED Final   Influenza B NOT DETECTED NOT DETECTED Final   Parainfluenza Virus 1 NOT DETECTED NOT DETECTED Final   Parainfluenza Virus 2 NOT DETECTED NOT DETECTED Final   Parainfluenza Virus 3 NOT DETECTED NOT DETECTED Final   Parainfluenza Virus 4 NOT DETECTED NOT DETECTED Final   Respiratory Syncytial Virus NOT DETECTED NOT DETECTED Final   Bordetella pertussis NOT DETECTED NOT DETECTED Final   Bordetella Parapertussis NOT DETECTED NOT DETECTED Final   Chlamydophila pneumoniae NOT DETECTED NOT DETECTED Final   Mycoplasma pneumoniae NOT DETECTED NOT DETECTED Final    Comment: Performed at [facility], [place]. [address], [place],  [place] [address]  Culture, blood (Routine X 2) w Reflex to ID Panel     Status: None (Preliminary result)   Collection Time: 12/20/20  2:28 PM   Specimen: BLOOD LEFT HAND  Result Value Ref Range Status   Specimen Description BLOOD LEFT HAND  Final   Special Requests   Final    BOTTLES DRAWN AEROBIC ONLY Blood Culture results may not be optimal due to an inadequate volume of blood received in culture bottles   Culture   Final    NO GROWTH 2 DAYS Performed at [facility], [place] [address] [place], [place] [address]    Report Status PENDING  Incomplete  Culture, blood (Routine X 2) w Reflex to ID Panel     Status: None (Preliminary result)   Collection Time: 12/20/20  2:31 PM   Specimen: BLOOD RIGHT HAND  Result Value Ref Range Status   Specimen Description BLOOD RIGHT HAND  Final   Special Requests   Final    BOTTLES DRAWN AEROBIC AND ANAEROBIC Blood Culture adequate volume   Culture   Final    NO GROWTH 2 DAYS Performed at [facility], [place] [address] [place], [place] [address]    Report Status PENDING  Incomplete    Studies/Results: NM Pulmonary Perfusion  Result Date: 12/21/2020 CLINICAL DATA:  Hypotension, tachypnea, atrial fibrillation with rapid ventricular response in setting of sepsis, question pulmonary embolism EXAM: NUCLEAR MEDICINE PERFUSION LUNG SCAN TECHNIQUE: Perfusion images were obtained in multiple projections after intravenous injection of radiopharmaceutical. Ventilation scans intentionally deferred if perfusion scan and chest x-ray adequate for interpretation during COVID 19 epidemic. RADIOPHARMACEUTICALS:  4.4 mCi Tc-5m MAA IV COMPARISON:  None Correlation: Chest radiograph 12/21/2020 FINDINGS: Normal perfusion lung scan. No perfusion defects. IMPRESSION: Normal perfusion lung scan. Electronically Signed   By: Lavonia Dana M.D.   On: 12/21/2020 11:43   DG CHEST PORT 1 VIEW  Result Date: 12/21/2020 CLINICAL DATA:  Sepsis, wheezing, shortness of breath.  EXAM: PORTABLE CHEST 1 VIEW COMPARISON:  12/19/2020 and CT chest 09/29/2020. FINDINGS: [name] slightly rotated. Trachea is midline. Heart is enlarged, stable. Thoracic aorta is calcified. Pacemaker lead tips are in  the right atrium and right ventricle. Lungs are somewhat low in volume with mild pulmonary vascular prominence. No pleural fluid. Right shoulder arthroplasty. Gaseous distension of the stomach, incompletely imaged. IMPRESSION: 1. Mild pulmonary vascular prominence may be due to low lung volumes. Difficult to exclude mild edema. 2.  Aortic atherosclerosis (ICD10-I70.0). Electronically Signed   By: Leanna Battles M.D.   On: 12/21/2020 08:06   ECHOCARDIOGRAM LIMITED  Result Date: 12/21/2020    ECHOCARDIOGRAM LIMITED REPORT   Patient Name:   Jillian Leblanc Date of Exam: 12/21/2020 Medical Rec #:  [id number]      Height:       60.0 in Accession #:    [id number]     Weight:       214.3 lb Date of Birth:  Mar 05, 1945      BSA:          1.922 m Patient Age:    75 years       BP:           99/65 mmHg Patient Gender: F              HR:           123 bpm. Exam Location:  Inpatient Procedure: Limited Echo, Color Doppler and Cardiac Doppler Indications:    Bacteremia  History:        Patient has prior history of Echocardiogram examinations, most                 recent 11/24/2020. CHF, Arrythmias:Dysrhythmia; Risk                 Factors:Diabetes.  Sonographer:    Gertie Fey MHA, RDMS, RVT, RDCS Referring Phys: [id number] Ahaana Rochette N [place] IMPRESSIONS  1. LVEF appears moderately depressed. Endocardium is not well seen in all segments. WOuld recomm limited echo with Definity to confirm wall motion and LVEF . Left ventricular diastolic parameters are indeterminate.  2. Right ventricular systolic function is normal. The right ventricular size is mildly enlarged.  3. Right atrial size was mildly dilated.  4. Trivial mitral valve regurgitation.  5. Tricuspid valve regurgitation is moderate.  6. The aortic valve  is tricuspid. Aortic valve regurgitation is not visualized. Aortic valve sclerosis is present, with no evidence of aortic valve stenosis. FINDINGS  Left Ventricle: LVEF appears moderately depressed. Endocardium is not well seen in all segments. WOuld recomm limited echo with Definity to confirm wall motion and LVEF. The left ventricular internal cavity size was normal in size. Left ventricular diastolic parameters are indeterminate. Right Ventricle: The right ventricular size is mildly enlarged. Right vetricular wall thickness was not assessed. Right ventricular systolic function is normal. Left Atrium: Left atrial size was normal in size. Right Atrium: Right atrial size was mildly dilated. Pericardium: There is no evidence of pericardial effusion. Mitral Valve: There is mild thickening of the mitral valve leaflet(s). Mild to moderate mitral annular calcification. Trivial mitral valve regurgitation. Tricuspid Valve: The tricuspid valve is normal in structure. Tricuspid valve regurgitation is moderate. Aortic Valve: The aortic valve is tricuspid. Aortic valve regurgitation is not visualized. Aortic valve sclerosis is present, with no evidence of aortic valve stenosis. Pulmonic Valve: The pulmonic valve was grossly normal. Pulmonic valve regurgitation is not visualized. Additional Comments: A device lead is visualized. TRICUSPID VALVE TR Peak grad:   40.4 mmHg TR Vmax:        318.00 cm/s Dietrich Pates MD Electronically signed by Dietrich Pates MD Signature Date/Time: 12/21/2020/5:00:32 PM  Final       Assessment/Plan:  INTERVAL HISTORY: Patient seems to be improving clinically and now working with physical therapy   Principal Problem:   Gram-negative bacteremia Active Problems:   AKI (acute kidney injury) ([place])   Type 2 diabetes mellitus with hypoglycemia without coma (HCC)   Hypothyroidism   Severe sepsis (HCC)   Acute metabolic encephalopathy   Acute hyponatremia   Thrombocytopenia (HCC)   Chronic  anemia   Chronic diastolic CHF (congestive heart failure) (HCC)   Discitis of lumbar region   Sepsis with acute renal failure without septic shock ([place])   Wheeze   Pancytopenia (HCC)    Jillian Leblanc is a 75 y.o. female with multiple medical problems including diabetes mellitus peripheral neuropathy chronic struct systolic heart failure pacemaker diabetes mellitus admitted to [facility] after falling acute renal failure with multiple metabolic derangements.  She had subsequent falls and CT scan in October [place] what was an apparent burst fracture at L5 vertebral body.  In the interim after she went home she came back with worsening back pain fevers followed by confusion and some somnolence.  In the ER she was febrile and with septic shock and endorgan failure.  CT of the abdomen pelvis performed that shows infection the L4 S1 with gas suspicious for osteomyelitis and infection in the spine.  Her pacemaker and her urinary device apparently make MRI not possible.  Radiology cannot safely perform IR guided aspirate of disc at this point in time.  She has also been medically too unstable to undergo neurosurgery.   #1  E. coli bacteremia with vertebral infection and likely paravertebral abscess:  E. coli was pansensitive and we will switch to cefazolin to give her narrow therapy without as much of the salt load that unasyn or AMP might provide given her CHF  Continue neurological checks  TTE was limited  If she can undergo TEE I think that is prudent given she has Pacemaker  She will need 6 weeks of effective treatment for her spine infection  #2 Thrombocytopenia:   Undoubtedly due to her sepsis this is improving  #3 ARFL this is resolving as well  I spent 38 minutes with the patient including  face to face counseling of the patient regarding her E. coli bacteremia and discitis vertebral infection personally reviewing CXR, blood cultures, TTE along review of medical records in  preparation for the visit and during the visit and in coordination of her care with Critical Care.      LOS: 4 days   Alcide Evener 12/22/2020, 11:12 AM

## 2020-12-22 NOTE — Evaluation (Signed)
Clinical/Bedside Swallow Evaluation Patient Details  Name: Jillian Leblanc MRN: [id number] Date of Birth: 04-29-1945  Today's Date: 12/22/2020 Time: SLP Start Time (ACUTE ONLY): 1333 SLP Stop Time (ACUTE ONLY): 1347 SLP Time Calculation (min) (ACUTE ONLY): 14 min  Past Medical History:  Past Medical History:  Diagnosis Date   CHF (congestive heart failure) (HCC)    Chronic back pain    Diabetes mellitus without complication (HCC)    Dysrhythmia    Hypothyroidism    Interstitial cystitis    Presence of permanent cardiac pacemaker    Thyroid disease    Past Surgical History:  Past Surgical History:  Procedure Laterality Date   ABDOMINAL HYSTERECTOMY     CHOLECYSTECTOMY     NECK SURGERY     PACEMAKER IMPLANT N/A 08/16/2019   Procedure: PACEMAKER IMPLANT;  Surgeon: Regan Lemming, MD;  Location: [facility] INVASIVE [facility];  Service: Cardiovascular;  Laterality: N/A;   REVERSE SHOULDER ARTHROPLASTY Right 07/28/2020   Procedure: REVERSE SHOULDER ARTHROPLASTY;  Surgeon: Francena Hanly, MD;  Location: [facility] ORS;  Service: Orthopedics;  Laterality: Right;   SHOULDER SURGERY     THYROIDECTOMY     HPI:  75 y/o female admitted 11/25 with severe sepsis with UTI and possible L5-S1 abscess/osteo. Pt with Afib with RVR, hypotension and AMS with transfer to ICU 11/27. Not currently sx candidate. PMHx:  DM, PPM, neuropathy, falls, L5 fx Oct '22    Assessment / Plan / Recommendation  Clinical Impression  Pt is too lethargic to attempt PO intake at this time despite multimodal cues and stimulation to maximize level of alertness. She shows now awareness of boluses presented toward her oral cavity. Recommend that she remain NPO for now, hopefully with good prognosis for PO diet as she becomes more alert.      Aspiration Risk  Severe aspiration risk    Diet Recommendation NPO   Medication Administration: Via alternative means    Other  Recommendations Oral Care Recommendations: Oral care QID Other  Recommendations: Have oral suction available    Recommendations for follow up therapy are one component of a multi-disciplinary discharge planning process, led by the attending physician.  Recommendations may be updated based on patient status, additional functional criteria and insurance authorization.  Follow up Recommendations Skilled nursing-short term rehab (<3 hours/day)      Assistance Recommended at Discharge Frequent or constant Supervision/Assistance  Functional Status Assessment Patient has had a recent decline in their functional status and demonstrates the ability to make significant improvements in function in a reasonable and predictable amount of time.  Frequency and Duration min 2x/week  2 weeks       Prognosis Prognosis for Safe Diet Advancement: Good Barriers to Reach Goals: Cognitive deficits      Swallow Study   General HPI: 75 y/o female admitted 11/25 with severe sepsis with UTI and possible L5-S1 abscess/osteo. Pt with Afib with RVR, hypotension and AMS with transfer to ICU 11/27. Not currently sx candidate. PMHx:  DM, PPM, neuropathy, falls, L5 fx Oct '22 Type of Study: Bedside Swallow Evaluation Previous Swallow Assessment: none in chart Diet Prior to this Study: NPO Temperature Spikes Noted: No Respiratory Status: Room air History of Recent Intubation: No Behavior/Cognition: Lethargic/Drowsy;Doesn't follow directions Oral Care Completed by SLP: Yes Patient Positioning: Upright in bed;Other (comment) (tends to lean to her R side) Baseline Vocal Quality: Not observed Volitional Cough: Cognitively unable to elicit Volitional Swallow: Unable to elicit    Oral/Motor/Sensory Function     Ice  Chips Ice chips: Not tested   Thin Liquid Thin Liquid: Not tested    Nectar Thick Nectar Thick Liquid: Not tested   Honey Thick Honey Thick Liquid: Not tested   Puree Puree: Not tested   Solid     Solid: Not tested      Osie Bond., M.A. [place] [facility] Pager [phone] Office [phone]  12/22/2020,3:03 PM

## 2020-12-22 NOTE — Progress Notes (Signed)
Ir was requested for L5-S1 disc aspiration, but patient was too unstable for the procedure.   Discussed with Dr. Daiva Eves and Dr. Chestine Spore, ok to cancel the disc aspiration as patient may not be able to tolerate the procedure (patient has severe back pain, unclear if she can tolerate prone position.)  Will cancel the disc aspiration order, Please call IR for questions and concerns.  Lynann Bologna Keanna Tugwell PA-C 12/22/2020 3:54 PM

## 2020-12-22 NOTE — TOC Initial Note (Addendum)
Transition of Care [facility]) - Initial/Assessment Note    Patient Details  Name: Jillian Leblanc MRN: [id number] Date of Birth: [birth year]  Transition of Care [facility]) CM/SW Contact:    Delilah Shan, LCSWA Phone Number: 12/22/2020, 3:54 PM  Clinical Narrative:                  CSW received consult for possible SNF placement at time of discharge. CSW spoke with patients daughter Jillian Leblanc regarding PT recommendation of SNF placement for patient  at time of discharge. Jillian Leblanc reports patient comes from home with spouse. Patients daughter Jillian Leblanc expressed understanding of PT recommendation and request to get back with CSW tomorrow after speaking with patients spouse. Patients daughter confirmed she will call CSW tomorrow with decision on possible SNF placement. Jillian Leblanc reports patient  has received the COVID vaccines. CSW to continue to follow and assist with discharge planning needs.        Patient Goals and CMS Choice        Expected Discharge Plan and Services                                                Prior Living Arrangements/Services                       Activities of Daily Living Home Assistive Devices/Equipment: Dan Humphreys (specify type) ADL Screening (condition at time of admission) Patient's cognitive ability adequate to safely complete daily activities?: No Is the patient deaf or have difficulty hearing?: No Does the patient have difficulty seeing, even when wearing glasses/contacts?: Yes Does the patient have difficulty concentrating, remembering, or making decisions?: Yes Patient able to express need for assistance with ADLs?: Yes Does the patient have difficulty dressing or bathing?: Yes Independently performs ADLs?: No Does the patient have difficulty walking or climbing stairs?: Yes Weakness of Legs: Both Weakness of Arms/Hands: Both  Permission Sought/Granted                  Emotional Assessment              Admission diagnosis:  Right  hip pain [M25.551] AKI (acute kidney injury) (HCC) [N17.9] Severe sepsis (HCC) [A41.9, R65.20] 1 minute Apgar score 3 [Z78.9] Sepsis with acute renal failure without septic shock, due to unspecified organism, unspecified acute renal failure type (HCC) [A41.9, R65.20, N17.9] Patient Active Problem List   Diagnosis Date Noted   Discitis of lumbar region    Sepsis with acute renal failure without septic shock (HCC)    Wheeze    Pancytopenia (HCC)    Severe sepsis (HCC) 12/19/2020   Acute metabolic encephalopathy 12/19/2020   Acute hyponatremia 12/19/2020   Thrombocytopenia (HCC) 12/19/2020   Chronic anemia 12/19/2020   Chronic diastolic CHF (congestive heart failure) (HCC) 12/19/2020   Gram-negative bacteremia 12/19/2020   Prolonged QT interval 12/04/2020   Chronic pain 12/04/2020   Fall    Compression of lumbar vertebra (HCC)    Type 2 diabetes mellitus with hypoglycemia without coma (HCC)    Hypothyroidism    Syncope 11/23/2020   AKI (acute kidney injury) (HCC) 11/22/2020   Multiple falls 09/14/2020   Pressure injury of skin 09/14/2020   Generalized weakness 09/14/2020   Hyponatremia 07/26/2020   Closed displaced fracture of coracoid process of right shoulder 07/26/2020   Diabetes mellitus type 2 in obese (  [place]) 07/26/2020   Elevated troponin 07/26/2020   Recurrent falls 08/16/2019   Sinus arrest 08/15/2019   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 - [place], [place] - [address] [place] [id number] [place] [address] Phone: [phone] Fax: [phone]     Social Determinants of Health (SDOH) Interventions    Readmission Risk Interventions No flowsheet data found.

## 2020-12-23 DIAGNOSIS — A419 Sepsis, unspecified organism: Secondary | ICD-10-CM | POA: Diagnosis not present

## 2020-12-23 DIAGNOSIS — E871 Hypo-osmolality and hyponatremia: Secondary | ICD-10-CM | POA: Diagnosis not present

## 2020-12-23 DIAGNOSIS — N179 Acute kidney failure, unspecified: Secondary | ICD-10-CM | POA: Diagnosis not present

## 2020-12-23 DIAGNOSIS — R7881 Bacteremia: Secondary | ICD-10-CM | POA: Diagnosis not present

## 2020-12-23 DIAGNOSIS — B9620 Unspecified Escherichia coli [E. coli] as the cause of diseases classified elsewhere: Secondary | ICD-10-CM

## 2020-12-23 DIAGNOSIS — G9341 Metabolic encephalopathy: Secondary | ICD-10-CM | POA: Diagnosis not present

## 2020-12-23 DIAGNOSIS — J9601 Acute respiratory failure with hypoxia: Secondary | ICD-10-CM | POA: Diagnosis not present

## 2020-12-23 LAB — BASIC METABOLIC PANEL
Anion gap: 11 (ref 5–15)
BUN: 24 mg/dL — ABNORMAL HIGH (ref 8–23)
CO2: 22 mmol/L (ref 22–32)
Calcium: 8.8 mg/dL — ABNORMAL LOW (ref 8.9–10.3)
Chloride: 95 mmol/L — ABNORMAL LOW (ref 98–111)
Creatinine, Ser: 1.12 mg/dL — ABNORMAL HIGH (ref 0.44–1.00)
GFR, Estimated: 52 mL/min — ABNORMAL LOW (ref >60)
Glucose, Bld: 224 mg/dL — ABNORMAL HIGH (ref 70–99)
Potassium: 4.7 mmol/L (ref 3.5–5.1)
Sodium: 128 mmol/L — ABNORMAL LOW (ref 135–145)

## 2020-12-23 LAB — GLUCOSE, CAPILLARY
Glucose-Capillary: 151 mg/dL — ABNORMAL HIGH (ref 70–99)
Glucose-Capillary: 179 mg/dL — ABNORMAL HIGH (ref 70–99)
Glucose-Capillary: 185 mg/dL — ABNORMAL HIGH (ref 70–99)
Glucose-Capillary: 200 mg/dL — ABNORMAL HIGH (ref 70–99)
Glucose-Capillary: 293 mg/dL — ABNORMAL HIGH (ref 70–99)
Glucose-Capillary: 352 mg/dL — ABNORMAL HIGH (ref 70–99)

## 2020-12-23 MED ORDER — CHLORHEXIDINE GLUCONATE CLOTH 2 % EX PADS
6.0000 | MEDICATED_PAD | Freq: Every day | CUTANEOUS | Status: DC
  Administered 2020-12-24 – 2021-01-02 (×9): 6 via TOPICAL

## 2020-12-23 MED ORDER — FLUTICASONE PROPIONATE 50 MCG/ACT NA SUSP
1.0000 | Freq: Every day | NASAL | Status: DC
Start: 2020-12-23 — End: 2021-01-09
  Administered 2020-12-23 – 2021-01-08 (×17): 1 via NASAL
  Filled 2020-12-23 (×2): qty 16

## 2020-12-23 MED ORDER — INSULIN DETEMIR 100 UNIT/ML ~~LOC~~ SOLN
8.0000 [IU] | Freq: Every day | SUBCUTANEOUS | Status: DC
  Administered 2020-12-24 – 2021-01-02 (×9): 8 [IU] via SUBCUTANEOUS
  Filled 2020-12-23 (×10): qty 0.08

## 2020-12-23 MED ORDER — PHENOL 1.4 % MT LIQD
1.0000 | OROMUCOSAL | Status: DC | PRN
  Filled 2020-12-23: qty 177

## 2020-12-23 MED ORDER — BENZONATATE 100 MG PO CAPS
200.0000 mg | ORAL_CAPSULE | Freq: Two times a day (BID) | ORAL | Status: DC | PRN
  Administered 2020-12-23 – 2020-12-24 (×2): 200 mg via ORAL
  Filled 2020-12-23 (×2): qty 2

## 2020-12-23 MED ORDER — FUROSEMIDE 10 MG/ML IJ SOLN
40.0000 mg | Freq: Once | INTRAMUSCULAR | Status: AC
  Administered 2020-12-23: 40 mg via INTRAVENOUS
  Filled 2020-12-23: qty 4

## 2020-12-23 MED ORDER — LEVOTHYROXINE SODIUM 75 MCG PO TABS
150.0000 ug | ORAL_TABLET | Freq: Every day | ORAL | Status: DC
  Administered 2020-12-24 – 2021-01-08 (×15): 150 ug via ORAL
  Filled 2020-12-23 (×15): qty 2

## 2020-12-23 MED ORDER — DEXTROMETHORPHAN POLISTIREX ER 30 MG/5ML PO SUER
15.0000 mg | Freq: Two times a day (BID) | ORAL | Status: DC | PRN
  Administered 2020-12-23 – 2020-12-31 (×6): 15 mg via ORAL
  Filled 2020-12-23 (×10): qty 5

## 2020-12-23 MED ORDER — INSULIN ASPART 100 UNIT/ML IJ SOLN
0.0000 [IU] | INTRAMUSCULAR | Status: DC
  Administered 2020-12-23: 15 [IU] via SUBCUTANEOUS
  Administered 2020-12-24 (×2): 2 [IU] via SUBCUTANEOUS
  Administered 2020-12-24: 5 [IU] via SUBCUTANEOUS
  Administered 2020-12-24: 8 [IU] via SUBCUTANEOUS
  Administered 2020-12-24: 3 [IU] via SUBCUTANEOUS
  Administered 2020-12-24: 2 [IU] via SUBCUTANEOUS
  Administered 2020-12-25: 3 [IU] via SUBCUTANEOUS
  Administered 2020-12-25: 2 [IU] via SUBCUTANEOUS
  Administered 2020-12-25: 5 [IU] via SUBCUTANEOUS
  Administered 2020-12-26: 8 [IU] via SUBCUTANEOUS
  Administered 2020-12-26 (×3): 3 [IU] via SUBCUTANEOUS
  Administered 2020-12-26 – 2020-12-27 (×2): 8 [IU] via SUBCUTANEOUS
  Administered 2020-12-27: 09:00:00 3 [IU] via SUBCUTANEOUS
  Administered 2020-12-27 (×2): 5 [IU] via SUBCUTANEOUS
  Administered 2020-12-28 (×2): 2 [IU] via SUBCUTANEOUS
  Administered 2020-12-28: 11 [IU] via SUBCUTANEOUS
  Administered 2020-12-28: 3 [IU] via SUBCUTANEOUS
  Administered 2020-12-28 (×2): 8 [IU] via SUBCUTANEOUS
  Administered 2020-12-29: 3 [IU] via SUBCUTANEOUS
  Administered 2020-12-29: 2 [IU] via SUBCUTANEOUS
  Administered 2020-12-29 (×2): 3 [IU] via SUBCUTANEOUS
  Administered 2020-12-30 (×2): 2 [IU] via SUBCUTANEOUS
  Administered 2020-12-30: 3 [IU] via SUBCUTANEOUS
  Administered 2020-12-30: 8 [IU] via SUBCUTANEOUS
  Administered 2020-12-30: 11 [IU] via SUBCUTANEOUS
  Administered 2020-12-30: 2 [IU] via SUBCUTANEOUS
  Administered 2020-12-31: 3 [IU] via SUBCUTANEOUS
  Administered 2020-12-31: 2 [IU] via SUBCUTANEOUS
  Administered 2020-12-31 (×2): 3 [IU] via SUBCUTANEOUS
  Administered 2020-12-31: 2 [IU] via SUBCUTANEOUS
  Administered 2021-01-01: 8 [IU] via SUBCUTANEOUS
  Administered 2021-01-01 (×4): 2 [IU] via SUBCUTANEOUS
  Administered 2021-01-02: 5 [IU] via SUBCUTANEOUS
  Administered 2021-01-02: 3 [IU] via SUBCUTANEOUS
  Administered 2021-01-02: 2 [IU] via SUBCUTANEOUS
  Administered 2021-01-02: 3 [IU] via SUBCUTANEOUS
  Administered 2021-01-03 (×3): 2 [IU] via SUBCUTANEOUS
  Administered 2021-01-03: 8 [IU] via SUBCUTANEOUS
  Administered 2021-01-04 – 2021-01-05 (×3): 2 [IU] via SUBCUTANEOUS
  Administered 2021-01-05 (×2): 3 [IU] via SUBCUTANEOUS
  Administered 2021-01-05 – 2021-01-06 (×3): 2 [IU] via SUBCUTANEOUS
  Administered 2021-01-06: 20:00:00 3 [IU] via SUBCUTANEOUS
  Administered 2021-01-06 (×2): 2 [IU] via SUBCUTANEOUS
  Administered 2021-01-07 (×2): 3 [IU] via SUBCUTANEOUS
  Administered 2021-01-07 (×2): 2 [IU] via SUBCUTANEOUS
  Administered 2021-01-07: 3 [IU] via SUBCUTANEOUS

## 2020-12-23 MED ORDER — LORATADINE 10 MG PO TABS
10.0000 mg | ORAL_TABLET | Freq: Every day | ORAL | Status: DC
  Administered 2020-12-23 – 2021-01-08 (×16): 10 mg via ORAL
  Filled 2020-12-23 (×18): qty 1

## 2020-12-23 MED ORDER — OXYCODONE HCL 5 MG PO TABS
5.0000 mg | ORAL_TABLET | Freq: Four times a day (QID) | ORAL | Status: DC | PRN
  Administered 2020-12-23 – 2020-12-24 (×3): 5 mg via ORAL
  Administered 2020-12-24: 10 mg via ORAL
  Administered 2020-12-24 – 2020-12-25 (×2): 5 mg via ORAL
  Administered 2020-12-25 – 2020-12-28 (×10): 10 mg via ORAL
  Administered 2020-12-29 – 2021-01-02 (×8): 5 mg via ORAL
  Administered 2021-01-03 – 2021-01-08 (×6): 10 mg via ORAL
  Filled 2020-12-23 (×2): qty 2
  Filled 2020-12-23: qty 1
  Filled 2020-12-23 (×2): qty 2
  Filled 2020-12-23: qty 1
  Filled 2020-12-23: qty 2
  Filled 2020-12-23: qty 1
  Filled 2020-12-23: qty 2
  Filled 2020-12-23: qty 1
  Filled 2020-12-23: qty 2
  Filled 2020-12-23 (×3): qty 1
  Filled 2020-12-23 (×5): qty 2
  Filled 2020-12-23: qty 1
  Filled 2020-12-23 (×2): qty 2
  Filled 2020-12-23: qty 1
  Filled 2020-12-23: qty 2
  Filled 2020-12-23: qty 1
  Filled 2020-12-23: qty 2
  Filled 2020-12-23 (×2): qty 1
  Filled 2020-12-23: qty 2
  Filled 2020-12-23: qty 1
  Filled 2020-12-23 (×2): qty 2
  Filled 2020-12-23: qty 1

## 2020-12-23 MED ORDER — PANTOPRAZOLE SODIUM 40 MG PO TBEC
40.0000 mg | DELAYED_RELEASE_TABLET | Freq: Every day | ORAL | Status: DC
  Administered 2020-12-23 – 2021-01-08 (×16): 40 mg via ORAL
  Filled 2020-12-23 (×17): qty 1

## 2020-12-23 NOTE — Progress Notes (Signed)
NAME:  Jillian Leblanc, MRN:  [id number], DOB:  Apr 20, 1945, LOS: 5 ADMISSION DATE:  12/18/2020, CONSULTATION DATE:  12/20/20 REFERRING MD:  Imogene Burn, Hospitalist, CHIEF COMPLAINT:  Back pain    History of Present Illness:  75 yo woman who presented with back pain after falls, developed sepsis, Ecoli from Blood cultures, now with hypotension and afib with RVR, fever.  Admitted 11/25 pm with SOB, non productive cough, subjective fever, fatigue, muscle aches, and back pain (hadn't completely resolved after prior recent discharge).   Increasing confusion and intermittent somnolence at home, poor po intake. ICU asked to consult for ongoing hypotension, tachypnea, afib with RVR in the setting of sepsis with ecoli, osteomyelitis.   Per notes, rapid response at around 7pm, patient was tachypneic and using accesory muscles at the time. Cards called for afib, rec amio as needed, advised against further beta blocker or CCB.   Fever increased at around 7pm 11/26  Continued to have dyspnea and severe back pain overnight.  Fentanyl does not appear to be very helpful, last received at around 10pm.  Fevers increasing since 7pm, was given tylenol prior to my arrival.    Neurosurgery has seen her regarding the compression fracture and it has been managed non operatively during prior admissions.    Recent hx of AKI and hydronephrosis.    Cr actually improved from yesterday to tonight.   Hypoalbuminemia UTI  CT - no hydro   Musculoskeletal: A compression fracture of L5 is again identified, increased from 35% on 11/22/2020 to 50% today, now with irregularity of the L5 vertebral body. There are foci of gas in the L4-S1 paravertebral regions with small amount of gas posterior along the spinal canal at L5-S1. No discrete focal collection is identified on this noncontrast study. Disc protrusion at L5-S1 is again noted.   A healing RIGHT L1 transverse process fracture remote T12 compression fracture is  present.  Pertinent  Medical History  DM2 Interstitial cystitis Hypothyroidism Chronic pain Vertebral compression fracture HFpEF  Significant Hospital Events: Including procedures, antibiotic start and stop dates in addition to other pertinent events   11/25 admitted 11/27 moved to ICU, started pressors 11/29 weaned off pressors   Interim History / Subjective:  Still having back pain. Still delirious per RN's but better when she was working with SLP earlier. She complains of cough.  Objective   Blood pressure (!) 148/67, pulse 63, temperature (!) 96.7 F (35.9 C), resp. rate (!) 9, height 5' (1.524 m), weight 94.9 kg, SpO2 100 %.        Intake/Output Summary (Last 24 hours) at 12/23/2020 1334 Last data filed at 12/23/2020 1300 Gross per 24 hour  Intake 1620.2 ml  Output 2045 ml  Net -424.8 ml    Filed Weights   12/21/20 0542 12/22/20 0359 12/23/20 0500  Weight: 97.2 kg 95.5 kg 94.9 kg    Examination: General: chronically ill appearing, frail elderly woman lying in bed in NAD HENT: [place]/AT, eyes anicteric Lungs: wet-sounding cough. No wheezing or rhonchi. No accessory muscle use. Mild tachypnea. Cardiovascular: S1S2, RRR Abdomen:  obese, soft, NT Extremities: mild edema, no clubbing Neuro: awake, somewhat alert, able to answer simple questions, but still confused. Moving all extremities. Derm: no rashes, warm & dry  Na+  128 BUN 24 Cr 1.12   Resolved Hospital Problem list     Assessment & Plan:  75 yo woman with hx of DM2, cystitis, here with sepsis, ecoli bacteremia, UTI, vertebral compression fracture, osteomyelitis.   Septic  shock due to E. Coli bacteremia, UTI, septic discitis/OM L5-S1 -Ceftriaxone; 6 weeks of antibiotics and PICC line will be needed. Anticipate she will need SNF for this, especially with very limited mobility and frequent falls at baseline. -Appreciate NS input-- not a candidate for operative intervention currently and myelogram may  pose a higher disk of spreading infection. -off pressors   Acute respiratory failure with hypoxia Wheezing better with bronchodilators, concern for COPD vs more likely asthma with her allergy history. Mosaicism diffusely on lung images of CT T-spine on 11/22/20 supports obstructive lung disease. She denies smoking history or asthma history. Suspect acute pulmonary edema -lasix 40mg  again today -Con't yupelri & brovana. Adding ICS. -Con't solumedrol; can stop after 5 days. -Con't duonebs PRN -con't mucinex -tessalon and delsym added -adding GERD and allergic rhinitis meds  Allergic rhinitis -flonase -claritin  GERD -PPI daily added  Hyponatremia, not improving. 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; AKI resolved -renally dose meds, avoid nephrotoxic meds -con't lasix -strict I/O  Acute back pain 2/2 discitis & OM, previous compression fracture -con't home Elavil -con't  scheduled tylenol -adding lidocaine patch -try to optimize positioning to hopefully take weight off this area -con't PRN opiates - prefer oral to IV -con't gabapentin -PT, OT  Dysphagia -appreciate SLP's input  Acute metabolic encephalopathy, improvied-- multifactorial- hyponatremia, uncontrolled pain, pain meds, sepsis -optimize pain control; using non-opiate meds as backbone of regimen -OOB mobility as able -frequent reorientation  Afib with RVR , new onset, likely 2/2 sepsis. Back in NSR > 24 hours. -d/c amiodarone, monitor for recurrence of Afib on tele. -con't tele monitoring -Hold on starting heparin unless it recurs. Worry long-term about AC for her given frequent falls per daughter's report.  Acute thrombocytopenia, suspect this is due to antibiotics or sepsis> improving. No DIC. -monitor  Anemia, chronic & stable -transfuse for Hb <7 or hemodynamically significant bleeding -monitor periodically  Hypergylcemia, uncontrolled on steroids -increase levemir to 8 units daily -SSI PRN -goal BG  140-180  Deconditioning -needs PT & OT -Anticipate she will require SNF or rehab before discharge home due to frequent falls, deconditioning, need for prolonged IV antibiotics.  Stable to step down to med tele floor. D/w bedside Rns. TRH to assume care tomorrow. PCCM will sign off.   Best Practice (right click and "Reselect all SmartList Selections" daily)   Diet/type: dysphagia per SLP DVT prophylaxis: prophylactic heparin  GI prophylaxis: N/A Lines: N/A Foley:  Yes, and it is still needed Code Status:  full code Last date of multidisciplinary goals of care discussion [ daughter on 11/28 ]  Labs   CBC: Recent Labs  Lab 12/18/20 1949 12/19/20 0208 12/19/20 0237 12/19/20 2337 12/20/20 0429 12/21/20 0134 12/22/20 0017  WBC 5.4  --  4.4 2.8* 4.1 3.8* 5.9  NEUTROABS 4.8  --  4.0  --  3.7 2.7 5.2  HGB 8.9*   < > 8.6* 7.6* 8.1* 8.6* 9.1*  HCT 27.3*   < > 26.5* 22.0* 24.2* 24.6* 27.0*  MCV 88.6  --  91.4 85.3 85.5 84.8 86.5  PLT 69*  --  57* 48* 54* 68* 120*  118*   < > = values in this interval not displayed.     Basic Metabolic Panel: Recent Labs  Lab 12/19/20 0237 12/19/20 2337 12/20/20 0429 12/21/20 0134 12/22/20 0017 12/23/20 0052  NA 126* 129* 129* 127* 128* 128*  K 3.8 3.4* 3.6 3.9 5.0 4.7  CL 96* 97* 98 98 99 95*  CO2 17* 21*  18* 21* 20* 22  GLUCOSE 99 78 85 127* 218* 224*  BUN 35* 28* 25* 17 21 24*  CREATININE 1.98* 1.55* 1.56* 1.07* 1.11* 1.12*  CALCIUM 6.8* 7.3* 7.4* 7.6* 8.1* 8.8*  MG 1.5* 1.7  --   --  2.1  --     GFR: Estimated Creatinine Clearance: 46.1 mL/min (A) (by C-G formula based on SCr of 1.12 mg/dL (H)). Recent Labs  Lab 12/18/20 1955 12/18/20 2148 12/19/20 0237 12/19/20 2337 12/20/20 0429 12/21/20 0134 12/22/20 0017  PROCALCITON  --   --  16.24  --   --   --   --   WBC  --   --  4.4 2.8* 4.1 3.8* 5.9  LATICACIDVEN 2.1* 1.8  --   --  1.5  --   --      Liver Function Tests: Recent Labs  Lab 12/19/20 0237 12/19/20 2337  12/20/20 0429 12/21/20 0134 12/22/20 0017  AST 39 30 29 33 25  ALT 27 23 23 22 22   ALKPHOS 172* 208* 208* 243* 257*  BILITOT 1.8* 1.5* 1.7* 1.1 0.8  PROT 5.7* 5.3* 5.5* 5.8* 6.3*  ALBUMIN 2.2* 1.8* 1.9* 1.8* 1.9*    No results for input(s): LIPASE, AMYLASE in the last 168 hours. No results for input(s): AMMONIA in the last 168 hours.  ABG    Component Value Date/Time   PHART 7.384 12/19/2020 0208   PCO2ART 30.6 (L) 12/19/2020 0208   PO2ART 83 12/19/2020 0208   HCO3 18.3 (L) 12/19/2020 0208   TCO2 19 (L) 12/19/2020 0208   ACIDBASEDEF 6.0 (H) 12/19/2020 0208   O2SAT 96.0 12/19/2020 [place] Josi Roediger, DO 12/23/20 2:44 PM [facility]

## 2020-12-23 NOTE — TOC Progression Note (Signed)
Transition of Care [facility]) - Progression Note    Patient Details  Name: Pascale Maves MRN: [id number] Date of Birth: 31-Jul-1945  Transition of Care [facility]) CM/SW Contact  Delilah Shan, LCSWA Phone Number: 12/23/2020, 12:38 PM  Clinical Narrative:     CSW spoke with patients daughter [name] to follow up on dc plan for patient when medically ready. [name] reports she spoke with patients spouse. They are both in agreement to SNF placement for patient when medically ready for dc. [name] reports when patient is better oriented and able to discuss with patient if patient decides she does not want to pursue SNF then they may be interested in alternate plan at dc for patient. [name] gave CSW permission to fax out initial referral near the [facility] area. Gillian Scarce number one SNF choice for patient is [facility]. CSW will follow up with patients daughter when bed offers are in. No further questions reported at this time. CSW will continue to follow and assist with patients dc planning needs.       Expected Discharge Plan and Services                                                 Social Determinants of Health (SDOH) Interventions    Readmission Risk Interventions No flowsheet data found.

## 2020-12-23 NOTE — Progress Notes (Signed)
Called Elink re: patient has had consistent hacking nonproductive cough for an hour and a half at the time of writing without resolve to PRN medications. RN asking for something else to resolve cough.   At 0400 patient continues to have hacking cough and is now sobbing in bed- confused and seeing people in her room. Patient stating "it hurts, its hurts." When asked about what she is referring to, she mentions her lower back is hurting. CPOT measured 8- provided with PRN pain medication.  Still awaiting response from [facility] re: something else for her cough.

## 2020-12-23 NOTE — NC FL2 (Signed)
[place] LEVEL OF CARE SCREENING TOOL     IDENTIFICATION  Patient Name: Jillian Leblanc Birthdate: October 06, 1945 Sex: female Admission Date (Current Location): 12/18/2020  [facility] and [place] Number:  Herbalist and Address:  [facility] [place], [place] [address], [place], [place] [address]      Provider Number: [id number]  Attending Physician Name and Address:  Julian Hy, DO  Relative Name and Phone Number:  Abigail Butts [phone]    Current Level of Care: Hospital Recommended Level of Care: [place] Prior Approval Number:    Date Approved/Denied:   PASRR Number: [id number]  Discharge Plan: SNF    Current Diagnoses: Patient Active Problem List   Diagnosis Date Noted   Discitis of lumbar region    Sepsis with acute renal failure without septic shock ([place])    Wheeze    Pancytopenia ([place])    Severe sepsis ([place]) [id number]   Acute metabolic encephalopathy [id number]   Acute hyponatremia 12/19/2020   Thrombocytopenia ([place]) 12/19/2020   Chronic anemia 12/19/2020   Chronic diastolic CHF (congestive heart failure) ([place]) 12/19/2020   Gram-negative bacteremia 12/19/2020   Prolonged QT interval 12/04/2020   Chronic pain 12/04/2020   Fall    Compression of lumbar vertebra ([place])    Type 2 diabetes mellitus with hypoglycemia without coma ([place])    Hypothyroidism    Syncope 11/23/2020   AKI (acute kidney injury) ([place]) 11/22/2020   Multiple falls 09/14/2020   Pressure injury of skin 09/14/2020   Generalized weakness 09/14/2020   Hyponatremia 07/26/2020   Closed displaced fracture of coracoid process of right shoulder 07/26/2020   Diabetes mellitus type 2 in obese ([place]) 07/26/2020   Elevated troponin 07/26/2020   Recurrent falls 08/16/2019   Sinus arrest 08/15/2019   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   Cervicalgia of occipito-atlanto-axial region 06/26/2019   Head injury 06/26/2019   Frequent falls 06/26/2019     [place] RESPIRATION BLADDER Height & Weight     Self  O2 (Nasal Cannula 1 liter) Continent, External catheter (Urethral Catheter 16 Fr.) Weight: 209 lb 3.5 oz (94.9 kg) Height:  5' (152.4 cm)  BEHAVIORAL SYMPTOMS/MOOD NEUROLOGICAL BOWEL NUTRITION STATUS      Incontinent Diet (Please see discharge summary)  AMBULATORY STATUS COMMUNICATION OF NEEDS Skin   Extensive Assist Verbally Other (Comment) (abrasion,knee,R,L,Ecchymosis abdomen,arm,chest,R,L,anterior,posterior.,PI sacrum medial stage 2 4cmx.5cm,foam lift dressing changed daily,Incis. closed pretibial L,proximal,clean,dry,intact,PRN, See additional info)                       Personal Care Assistance Level of Assistance    Bathing Assistance: Maximum assistance Feeding assistance: Limited assistance Dressing Assistance: Maximum assistance     Functional Limitations Info  Sight, Hearing, Speech Sight Info: Impaired Hearing Info: Adequate Speech Info: Adequate    SPECIAL CARE FACTORS FREQUENCY  PT (By licensed PT), OT (By licensed OT)     PT Frequency: 5x min weekly OT Frequency: 5x min weekly            Contractures Contractures Info: Not present    Additional Factors Info  Code Status, Allergies, Psychotropic, Insulin Sliding Scale Code Status Info: FULL Code Allergies Info: Pentosan Polysulfate Sodium,Elmiron (pentosan Polysulfate,Naloxone Psychotropic Info: amitriptyline (ELAVIL) tablet 100 mg daily at bedtime Insulin Sliding Scale Info: insulin aspart (novoLOG) injection 1-3 Units every 4 hours,insulin detemir (LEVEMIR) injection 5 Units daily,       Current Medications (12/23/2020):  This is the current hospital active  medication list Current Facility-Administered Medications  Medication Dose Route Frequency Provider Last Rate Last Admin   0.9 %  sodium chloride infusion   Intravenous PRN Hughie Closs, MD   Stopped at 12/21/20 1240   0.9 %  sodium chloride infusion  250 mL Intravenous  Continuous Steffanie Dunn, DO       acetaminophen (TYLENOL) tablet 650 mg  650 mg Oral Q6H PRN Howerter, Justin B, DO   650 mg at 12/21/20 1817   Or   acetaminophen (TYLENOL) suppository 650 mg  650 mg Rectal Q6H PRN Howerter, Justin B, DO   650 mg at 12/19/20 0100   acetaminophen (TYLENOL) tablet 650 mg  650 mg Oral Once Pollyann Savoy, MD       acetaminophen (TYLENOL) tablet 650 mg  650 mg Oral Q6H Karie Fetch P, DO   650 mg at 12/23/20 1046   amiodarone (NEXTERONE PREMIX) 360-4.14 MG/200ML-% (1.8 mg/mL) IV infusion  30 mg/hr Intravenous Continuous Charlotte Sanes, MD 16.67 mL/hr at 12/23/20 1300 30 mg/hr at 12/23/20 1300   amitriptyline (ELAVIL) tablet 100 mg  100 mg Oral QHS Karie Fetch P, DO   100 mg at 12/22/20 2236   arformoterol (BROVANA) nebulizer solution 15 mcg  15 mcg Nebulization BID Steffanie Dunn, DO   15 mcg at 12/23/20 9892   benzonatate (TESSALON) capsule 200 mg  200 mg Oral BID PRN Jacinta Shoe, MD       calcium carbonate (TUMS - dosed in mg elemental calcium) chewable tablet 800 mg of elemental calcium  800 mg of elemental calcium Oral BID Howerter, Justin B, DO   800 mg of elemental calcium at 12/23/20 1047   calcium-vitamin D (OSCAL WITH D) 500-5 MG-MCG per tablet 0.5 tablet  0.5 tablet Oral BID Howerter, Justin B, DO   0.5 tablet at 12/23/20 1047   ceFAZolin (ANCEF) IVPB 2g/100 mL premix  2 g Intravenous Q8H Daiva Eves, Lisette Grinder, MD   Stopped at 12/23/20 1125   Chlorhexidine Gluconate Cloth 2 % PADS 6 each  6 each Topical Q0600 Hughie Closs, MD   6 each at 12/22/20 2200   dextromethorphan (DELSYM) 30 MG/5ML liquid 15 mg  15 mg Oral Q12H PRN Jacinta Shoe, MD   15 mg at 12/23/20 1050   docusate sodium (COLACE) capsule 100 mg  100 mg Oral BID PRN Charlotte Sanes, MD       fentaNYL (SUBLIMAZE) injection 1.5-50 mcg  1.5-50 mcg Intravenous Q2H PRN Karie Fetch P, DO   25 mcg at 12/23/20 0437   fentaNYL (SUBLIMAZE) injection 100 mcg  100 mcg Intravenous Once Karie Fetch P, DO       fluticasone (FLONASE) 50 MCG/ACT nasal spray 1 spray  1 spray Each Nare Daily Steffanie Dunn, DO   1 spray at 12/23/20 1134   gabapentin (NEURONTIN) capsule 300 mg  300 mg Oral BID Karl Ito, MD   300 mg at 12/23/20 1047   guaiFENesin (MUCINEX) 12 hr tablet 600 mg  600 mg Oral BID PRN Steffanie Dunn, DO   600 mg at 12/23/20 0249   heparin injection 5,000 Units  5,000 Units Subcutaneous Q8H Steffanie Dunn, DO   5,000 Units at 12/23/20 0650   hydrOXYzine (ATARAX/VISTARIL) tablet 25 mg  25 mg Oral QHS PRN Howerter, Justin B, DO   25 mg at 12/21/20 0319   insulin aspart (novoLOG) injection 1-3 Units  1-3 Units Subcutaneous Q4H Karie Fetch P, DO   2 Units  at 12/23/20 1239   insulin detemir (LEVEMIR) injection 5 Units  5 Units Subcutaneous Daily Julian Hy, DO   5 Units at 12/23/20 [id number]   ipratropium-albuterol (DUONEB) 0.5-2.5 (3) MG/3ML nebulizer solution 3 mL  3 mL Nebulization Q6H PRN Howerter, Justin B, DO   3 mL at 12/23/20 0251   [START ON 12/24/2020] levothyroxine (SYNTHROID) tablet 150 mcg  150 mcg Oral Q0600 Noemi Chapel P, DO       lidocaine (LIDODERM) 5 % 1 patch  1 patch Transdermal Daily Noemi Chapel P, DO   1 patch at 12/23/20 1049   lip balm (CARMEX) ointment   Topical PRN Darliss Cheney, MD   75 application at [id number]   loratadine (CLARITIN) tablet 10 mg  10 mg Oral Daily Noemi Chapel P, DO   10 mg at 12/23/20 1123   MEDLINE mouth rinse  15 mL Mouth Rinse BID Darliss Cheney, MD   15 mL at 12/23/20 1055   methocarbamol (ROBAXIN) tablet 500 mg  500 mg Oral Q8H PRN Anders Simmonds, MD   500 mg at 12/23/20 0301   methylPREDNISolone sodium succinate (SOLU-MEDROL) 40 mg/mL injection 40 mg  40 mg Intravenous Q24H Noemi Chapel P, DO   40 mg at 12/23/20 1123   mupirocin ointment (BACTROBAN) 2 % 1 application  1 application Nasal BID Darliss Cheney, MD   1 application at [id number]   naloxone [facility]) injection 0.4 mg  0.4 mg Intravenous PRN Howerter, Justin B, DO        oxyCODONE (Oxy IR/ROXICODONE) immediate release tablet 5-10 mg  5-10 mg Oral Q6H PRN Noemi Chapel P, DO   5 mg at 12/23/20 1123   pantoprazole (PROTONIX) EC tablet 40 mg  40 mg Oral Daily Noemi Chapel P, DO   40 mg at 12/23/20 1123   phenol (CHLORASEPTIC) mouth spray 1 spray  1 spray Mouth/Throat PRN Noemi Chapel P, DO       phenylephrine (NEO-SYNEPHRINE) 20mg /NS 271mL premix infusion  25-200 mcg/min Intravenous Titrated Julian Hy, DO   Stopped at 12/22/20 [id number]   polyethylene glycol (MIRALAX / GLYCOLAX) packet 17 g  17 g Oral Daily PRN Collier Bullock, MD       pramipexole (MIRAPEX) tablet 1.5 mg  1.5 mg Oral TID Howerter, Justin B, DO   1.5 mg at 12/23/20 1047   revefenacin (YUPELRI) nebulizer solution 175 mcg  175 mcg Nebulization Daily Julian Hy, DO   175 mcg at 12/23/20 [id number]     Discharge Medications: Please see discharge summary for a list of discharge medications.  Relevant Imaging Results:  Relevant Lab Results:   Additional Information [id number], Both Covid Vaccines, wound incision open or dehiced skin tear,abdomen,lower,medial under skin folds,moisture barrier,wound incision open or dehiced (MASD) abdomen,anterior,R,L,moisture barrier  Milas Gain, LCSWA

## 2020-12-23 NOTE — Progress Notes (Signed)
Speech Language Pathology Treatment: Dysphagia  Patient Details Name: Jillian Leblanc MRN: [id number] DOB: Apr 01, 1945 Today's Date: 12/23/2020 Time: 4098-1191 SLP Time Calculation (min) (ACUTE ONLY): 12 min  Assessment / Plan / Recommendation Clinical Impression  Pt is much more alert today for PO trials. Her swallowing function seemed to be functional across PO trials, although she is somewhat limited in her positioning secondary to back pain (8/10, RN made aware). Reverse Trendelenburg was used to maximize positioning for this tx. Upon leveling her bed back down after trials were finished, pt had a dry coughing spell. Note that this was occurring overnight as well in the absence of PO intake. Did discuss this with nursing to be able to monitor this, and to provide set-up assist with meals to optimize positioning. Given the above, pt is agreeable to start with Dys 3 diet and thin liquids with SLP f/u for potential to return to regular solids.    HPI HPI: 75 y/o female admitted 11/25 with severe sepsis with UTI and possible L5-S1 abscess/osteo. Pt with Afib with RVR, hypotension and AMS with transfer to ICU 11/27. Not currently sx candidate. PMHx:  DM, PPM, neuropathy, falls, L5 fx Oct '22      SLP Plan  Continue with current plan of care      Recommendations for follow up therapy are one component of a multi-disciplinary discharge planning process, led by the attending physician.  Recommendations may be updated based on patient status, additional functional criteria and insurance authorization.    Recommendations  Diet recommendations: Dysphagia 3 (mechanical soft);Thin liquid Liquids provided via: Cup;Straw Medication Administration: Whole meds with liquid Supervision: Staff to assist with self feeding Compensations: Minimize environmental distractions;Slow rate;Small sips/bites Postural Changes and/or Swallow Maneuvers: Seated upright 90 degrees                Oral Care  Recommendations: Oral care BID Follow Up Recommendations: Skilled nursing-short term rehab (<3 hours/day) Assistance recommended at discharge: Frequent or constant Supervision/Assistance SLP Visit Diagnosis: Dysphagia, unspecified (R13.10) Plan: Continue with current plan of care       GO                Mahala Menghini., M.A. CCC-SLP Acute Rehabilitation Services Pager [phone] Office [phone]  12/23/2020, 11:11 AM

## 2020-12-23 NOTE — Progress Notes (Signed)
Brief cardiology note:  Asked to comment on recommendation re: TEE to evaluate pacemaker leads. She is planned for 6 weeks of antibiotic therapy given her vertebral infection. Question would be whether there might be infection on her pacemaker leads.   While I think the TEE would help determine if leads are involved, I have two concerns. The first is timing. She has thrombocytopenia, starting to improve, and she has recently had hypotension and delerium. She will need monitored anesthesia for the TEE, and there is risk of bleeding/damage, especially with thrombocytopenia.  The second concern is whether she would require lead extraction if there is concern for infection on the leads. This procedure also has risk, and as I do not perform them, I am not sure that the risk/benefit works in her favor at this time.  If deemed absolutely necessary, I would consider TEE once she has remained normotensive and had platelets >150k consistently for at least 24 hours. Alternative would be to treat with antibiotics and then perform TEE if cultures do not clear. As she will already require 6 weeks of antibiotics, TEE would not change duration of therapy at this time.  I defer to the ID team on necessity, but in terms of timing, I do not think she is ready yet.  Once she meets the blood pressure/platelet recommendations above, please contact us and we can re-evaluate for TEE if it is deemed clinically necessary.  Jodelle Red, MD, PhD, [facility]  [facility]  [place]  Heart & Vascular at [facility] at [facility] [address] [place], [place] [address] [phone]

## 2020-12-23 NOTE — Progress Notes (Addendum)
eLink Physician-Brief Progress Note Patient Name: Jillian Leblanc DOB: [birth year] MRN: [id number]   Date of Service  12/23/2020  HPI/Events of Note  Notified of hyperglycemia with glucose at 352.   eICU Interventions  Increase insulin sliding scale to moderate range scale.      Intervention Category Major Interventions: Hyperglycemia - active titration of insulin therapy  Larinda Buttery 12/23/2020, 11:42 PM  5:38 AM Notifed of hyperkalemia with K 6.1,  Crea stable at 1.15.  Plan> Give kayexalate 15g PO.  Continue to monitor K.  No medications on board that typically cause hyperkalemia.

## 2020-12-23 NOTE — Progress Notes (Signed)
Subjective:  Still having significant back pain   Antibiotics:  Anti-infectives (From admission, onward)    Start     Dose/Rate Route Frequency Ordered Stop   12/21/20 1800  ceFAZolin (ANCEF) IVPB 2g/100 mL premix        2 g 200 mL/hr over 30 Minutes Intravenous Every 8 hours 12/21/20 1216     12/20/20 1800  cefTRIAXone (ROCEPHIN) 2 g in sodium chloride 0.9 % 100 mL IVPB  Status:  Discontinued        2 g 200 mL/hr over 30 Minutes Intravenous Every 24 hours 12/20/20 0920 12/21/20 1216   12/20/20 0100  meropenem (MERREM) 1 g in sodium chloride 0.9 % 100 mL IVPB  Status:  Discontinued        1 g 200 mL/hr over 30 Minutes Intravenous Every 8 hours 12/20/20 0004 12/20/20 0920   12/19/20 2000  ceFEPIme (MAXIPIME) 2 g in sodium chloride 0.9 % 100 mL IVPB  Status:  Discontinued        2 g 200 mL/hr over 30 Minutes Intravenous Every 24 hours 12/19/20 0057 12/19/20 1023   12/19/20 2000  cefTRIAXone (ROCEPHIN) 2 g in sodium chloride 0.9 % 100 mL IVPB  Status:  Discontinued        2 g 200 mL/hr over 30 Minutes Intravenous Every 24 hours 12/19/20 1023 12/20/20 0004   12/19/20 0057  vancomycin variable dose per unstable renal function (pharmacist dosing)  Status:  Discontinued         Does not apply See admin instructions 12/19/20 0057 12/19/20 1023   12/18/20 2000  ceFEPIme (MAXIPIME) 2 g in sodium chloride 0.9 % 100 mL IVPB        2 g 200 mL/hr over 30 Minutes Intravenous  Once 12/18/20 1955 12/18/20 2050   12/18/20 2000  metroNIDAZOLE (FLAGYL) IVPB 500 mg        500 mg 100 mL/hr over 60 Minutes Intravenous  Once 12/18/20 1955 12/18/20 2210   12/18/20 2000  vancomycin (VANCOREADY) IVPB 1750 mg/350 mL        1,750 mg 175 mL/hr over 120 Minutes Intravenous  Once 12/18/20 1955 12/19/20 0045       Medications: Scheduled Meds:  acetaminophen  650 mg Oral Once   acetaminophen  650 mg Oral Q6H   amitriptyline  100 mg Oral QHS   arformoterol  15 mcg Nebulization BID   calcium  carbonate  800 mg of elemental calcium Oral BID   calcium-vitamin D  0.5 tablet Oral BID   Chlorhexidine Gluconate Cloth  6 each Topical Q0600   fentaNYL (SUBLIMAZE) injection  100 mcg Intravenous Once   fluticasone  1 spray Each Nare Daily   gabapentin  300 mg Oral BID   heparin injection (subcutaneous)  5,000 Units Subcutaneous Q8H   insulin aspart  1-3 Units Subcutaneous Q4H   insulin detemir  5 Units Subcutaneous Daily   [START ON 12/24/2020] levothyroxine  150 mcg Oral Q0600   lidocaine  1 patch Transdermal Daily   loratadine  10 mg Oral Daily   mouth rinse  15 mL Mouth Rinse BID   methylPREDNISolone (SOLU-MEDROL) injection  40 mg Intravenous Q24H   mupirocin ointment  1 application Nasal BID   pantoprazole  40 mg Oral Daily   pramipexole  1.5 mg Oral TID   revefenacin  175 mcg Nebulization Daily   Continuous Infusions:  sodium chloride Stopped (12/21/20 1240)   sodium chloride  amiodarone 30 mg/hr (12/23/20 1300)    ceFAZolin (ANCEF) IV Stopped (12/23/20 1125)   phenylephrine (NEO-SYNEPHRINE) Adult infusion Stopped (12/22/20 0605)   PRN Meds:.sodium chloride, acetaminophen **OR** acetaminophen, benzonatate, dextromethorphan, docusate sodium, fentaNYL (SUBLIMAZE) injection, guaiFENesin, hydrOXYzine, ipratropium-albuterol, lip balm, methocarbamol, naLOXone (NARCAN)  injection, oxyCODONE, phenol, polyethylene glycol    Objective: Weight change: -0.6 kg  Intake/Output Summary (Last 24 hours) at 12/23/2020 1338 Last data filed at 12/23/2020 1300 Gross per 24 hour  Intake 1620.2 ml  Output 2045 ml  Net -424.8 ml    Blood pressure (!) 148/67, pulse 63, temperature (!) 96.7 F (35.9 C), resp. rate (!) 9, height 5' (1.524 m), weight 94.9 kg, SpO2 100 %. Temp:  [96.7 F (35.9 C)-98.5 F (36.9 C)] 96.7 F (35.9 C) (11/30 1236) Pulse Rate:  [50-77] 63 (11/30 1300) Resp:  [0-26] 9 (11/30 1300) BP: (101-148)/(49-109) 148/67 (11/30 1300) SpO2:  [92 %-100 %] 100 % (11/30  1300) Weight:  [94.9 kg] 94.9 kg (11/30 0500)  Physical Exam: Physical Exam Constitutional:      General: She is not in acute distress.    Appearance: She is well-developed. She is obese. She is ill-appearing. She is not diaphoretic.  HENT:     Head: Normocephalic and atraumatic.     Right Ear: External ear normal.     Left Ear: External ear normal.     Mouth/Throat:     Pharynx: No oropharyngeal exudate.  Eyes:     General: No scleral icterus.    Extraocular Movements: Extraocular movements intact.     Conjunctiva/sclera: Conjunctivae normal.  Cardiovascular:     Rate and Rhythm: Normal rate and regular rhythm.     Heart sounds: Normal heart sounds.  Pulmonary:     Effort: Pulmonary effort is normal. No respiratory distress.     Breath sounds: Rhonchi present. No wheezing or rales.  Abdominal:     General: Bowel sounds are normal. There is no distension.     Palpations: Abdomen is soft.     Tenderness: There is no abdominal tenderness.  Musculoskeletal:        General: No tenderness. Normal range of motion.  Lymphadenopathy:     Cervical: No cervical adenopathy.  Skin:    General: Skin is warm and dry.     Coloration: Skin is not pale.     Findings: No erythema or rash.  Neurological:     Mental Status: She is alert and oriented to person, place, and time.     Motor: No abnormal muscle tone.  Psychiatric:        Attention and Perception: She is inattentive.        Mood and Affect: Mood is depressed.        Speech: Speech is delayed.        Behavior: Behavior normal.        Thought Content: Thought content normal.        Cognition and Memory: Cognition normal.        Judgment: Judgment normal.     CBC:    BMET Recent Labs    12/22/20 0017 12/23/20 0052  NA 128* 128*  K 5.0 4.7  CL 99 95*  CO2 20* 22  GLUCOSE 218* 224*  BUN 21 24*  CREATININE 1.11* 1.12*  CALCIUM 8.1* 8.8*      Liver Panel  Recent Labs    12/21/20 0134 12/22/20 0017  PROT  5.8* 6.3*  ALBUMIN 1.8* 1.9*  AST 33 25  ALT 22 22  ALKPHOS 243* 257*  BILITOT 1.1 0.8        Sedimentation Rate Recent Labs    12/21/20 0134  ESRSEDRATE 95*    C-Reactive Protein Recent Labs    12/20/20 1428 12/21/20 0134  CRP 30.6* 30.0*     Micro Results: Recent Results (from the past 720 hour(s))  Resp Panel by RT-PCR (Flu A&B, Covid) Nasopharyngeal Swab     Status: None   Collection Time: 12/04/20  6:24 PM   Specimen: Nasopharyngeal Swab; Nasopharyngeal(NP) swabs in vial transport medium  Result Value Ref Range Status   SARS Coronavirus 2 by RT PCR NEGATIVE NEGATIVE Final    Comment: (NOTE) SARS-CoV-2 target nucleic acids are NOT DETECTED.  The SARS-CoV-2 RNA is generally detectable in upper respiratory specimens during the acute phase of infection. The lowest concentration of SARS-CoV-2 viral copies this assay can detect is 138 copies/mL. A negative result does not preclude SARS-Cov-2 infection and should not be used as the sole basis for treatment or other patient management decisions. A negative result may occur with  improper specimen collection/handling, submission of specimen other than nasopharyngeal swab, presence of viral mutation(s) within the areas targeted by this assay, and inadequate number of viral copies(<138 copies/mL). A negative result must be combined with clinical observations, patient history, and epidemiological information. The expected result is Negative.  Fact Sheet for Patients:  EntrepreneurPulse.com.au  Fact Sheet for Healthcare Providers:  IncredibleEmployment.be  This test is no t yet approved or cleared by the [place] FDA and  has been authorized for detection and/or diagnosis of SARS-CoV-2 by FDA under an Emergency Use Authorization (EUA). This EUA will remain  in effect (meaning this test can be used) for the duration of the COVID-19 declaration under Section 564(b)(1) of the  Act, 21 U.S.C.section 360bbb-3(b)(1), unless the authorization is terminated  or revoked sooner.       Influenza A by PCR NEGATIVE NEGATIVE Final   Influenza B by PCR NEGATIVE NEGATIVE Final    Comment: (NOTE) The Xpert Xpress SARS-CoV-2/FLU/RSV plus assay is intended as an aid in the diagnosis of influenza from Nasopharyngeal swab specimens and should not be used as a sole basis for treatment. Nasal washings and aspirates are unacceptable for Xpert Xpress SARS-CoV-2/FLU/RSV testing.  Fact Sheet for Patients: EntrepreneurPulse.com.au  Fact Sheet for Healthcare Providers: IncredibleEmployment.be  This test is not yet approved or cleared by the [place] FDA and has been authorized for detection and/or diagnosis of SARS-CoV-2 by FDA under an Emergency Use Authorization (EUA). This EUA will remain in effect (meaning this test can be used) for the duration of the COVID-19 declaration under Section 564(b)(1) of the Act, 21 U.S.C. section 360bbb-3(b)(1), unless the authorization is terminated or revoked.  Performed at [facility], [place] [address], [place], [place] [address]   C Difficile Quick Screen w PCR reflex     Status: None   Collection Time: 12/06/20  8:01 AM   Specimen: STOOL  Result Value Ref Range Status   C Diff antigen NEGATIVE NEGATIVE Final   C Diff toxin NEGATIVE NEGATIVE Final   C Diff interpretation No C. difficile detected.  Final    Comment: NEGATIVE Performed at [facility], [place] [address] [place], [place] [address]   Gastrointestinal Panel by PCR , Stool     Status: None   Collection Time: 12/07/20  8:56 AM   Specimen: STOOL  Result Value Ref Range Status   Campylobacter species  NOT DETECTED NOT DETECTED Final   Plesimonas shigelloides NOT DETECTED NOT DETECTED Final   Salmonella species NOT DETECTED NOT DETECTED Final   Yersinia enterocolitica NOT DETECTED NOT DETECTED Final   Vibrio species NOT  DETECTED NOT DETECTED Final   Vibrio cholerae NOT DETECTED NOT DETECTED Final   Enteroaggregative E coli (EAEC) NOT DETECTED NOT DETECTED Final   Enteropathogenic E coli (EPEC) NOT DETECTED NOT DETECTED Final   Enterotoxigenic E coli (ETEC) NOT DETECTED NOT DETECTED Final   Shiga like toxin producing E coli (STEC) NOT DETECTED NOT DETECTED Final   Shigella/Enteroinvasive E coli (EIEC) NOT DETECTED NOT DETECTED Final   Cryptosporidium NOT DETECTED NOT DETECTED Final   Cyclospora cayetanensis NOT DETECTED NOT DETECTED Final   Entamoeba histolytica NOT DETECTED NOT DETECTED Final   Giardia lamblia NOT DETECTED NOT DETECTED Final   Adenovirus F40/41 NOT DETECTED NOT DETECTED Final   Astrovirus NOT DETECTED NOT DETECTED Final   Norovirus GI/GII NOT DETECTED NOT DETECTED Final   Rotavirus A NOT DETECTED NOT DETECTED Final   Sapovirus (I, II, IV, and V) NOT DETECTED NOT DETECTED Final    Comment: Performed at [facility], [address] [place], [place] [address]  SARS CORONAVIRUS 2 (TAT 6-24 HRS) Nasopharyngeal Nasopharyngeal Swab     Status: None   Collection Time: 12/08/20 10:12 AM   Specimen: Nasopharyngeal Swab  Result Value Ref Range Status   SARS Coronavirus 2 NEGATIVE NEGATIVE Final    Comment: (NOTE) SARS-CoV-2 target nucleic acids are NOT DETECTED.  The SARS-CoV-2 RNA is generally detectable in upper and lower respiratory specimens during the acute phase of infection. Negative results do not preclude SARS-CoV-2 infection, do not rule out co-infections with other pathogens, and should not be used as the sole basis for treatment or other patient management decisions. Negative results must be combined with clinical observations, patient history, and epidemiological information. The expected result is Negative.  Fact Sheet for Patients: SugarRoll.be  Fact Sheet for Healthcare Providers: https://www.woods-mathews.com/  This  test is not yet approved or cleared by the [place] FDA and  has been authorized for detection and/or diagnosis of SARS-CoV-2 by FDA under an Emergency Use Authorization (EUA). This EUA will remain  in effect (meaning this test can be used) for the duration of the COVID-19 declaration under Se ction 564(b)(1) of the Act, 21 U.S.C. section 360bbb-3(b)(1), unless the authorization is terminated or revoked sooner.  Performed at [facility], [place] [address], [place], [place] [address]   Blood Culture (routine x 2)     Status: Abnormal   Collection Time: 12/18/20  7:49 PM   Specimen: BLOOD  Result Value Ref Range Status   Specimen Description BLOOD RIGHT ANTECUBITAL  Final   Special Requests   Final    BOTTLES DRAWN AEROBIC AND ANAEROBIC Blood Culture results may not be optimal due to an excessive volume of blood received in culture bottles   Culture  Setup Time   Final    GRAM NEGATIVE RODS IN BOTH AEROBIC AND ANAEROBIC BOTTLES CRITICAL RESULT CALLED TO, READ BACK BY AND VERIFIED WITH: J,MILLEN PHARMD @0958  12/19/20 EB Performed at [facility], [place] [address], [place], [place] [address]    Culture ESCHERICHIA COLI (A)  Final   Report Status 12/21/2020 FINAL  Final   Organism ID, Bacteria ESCHERICHIA COLI  Final      Susceptibility   Escherichia coli - MIC*    AMPICILLIN 8 SENSITIVE Sensitive  CEFAZOLIN <=4 SENSITIVE Sensitive     CEFEPIME <=0.12 SENSITIVE Sensitive     CEFTAZIDIME <=1 SENSITIVE Sensitive     CEFTRIAXONE <=0.25 SENSITIVE Sensitive     CIPROFLOXACIN <=0.25 SENSITIVE Sensitive     GENTAMICIN <=1 SENSITIVE Sensitive     IMIPENEM <=0.25 SENSITIVE Sensitive     TRIMETH/SULFA <=20 SENSITIVE Sensitive     AMPICILLIN/SULBACTAM <=2 SENSITIVE Sensitive     PIP/TAZO <=4 SENSITIVE Sensitive     * ESCHERICHIA COLI  Blood Culture ID Panel (Reflexed)     Status: Abnormal   Collection Time: 12/18/20  7:49 PM  Result Value Ref Range Status   Enterococcus  faecalis NOT DETECTED NOT DETECTED Final   Enterococcus Faecium NOT DETECTED NOT DETECTED Final   Listeria monocytogenes NOT DETECTED NOT DETECTED Final   Staphylococcus species NOT DETECTED NOT DETECTED Final   Staphylococcus aureus (BCID) NOT DETECTED NOT DETECTED Final   Staphylococcus epidermidis NOT DETECTED NOT DETECTED Final   Staphylococcus lugdunensis NOT DETECTED NOT DETECTED Final   Streptococcus species NOT DETECTED NOT DETECTED Final   Streptococcus agalactiae NOT DETECTED NOT DETECTED Final   Streptococcus pneumoniae NOT DETECTED NOT DETECTED Final   Streptococcus pyogenes NOT DETECTED NOT DETECTED Final   A.calcoaceticus-baumannii NOT DETECTED NOT DETECTED Final   Bacteroides fragilis NOT DETECTED NOT DETECTED Final   Enterobacterales DETECTED (A) NOT DETECTED Final    Comment: Enterobacterales represent a large order of gram negative bacteria, not a single organism. CRITICAL RESULT CALLED TO, READ BACK BY AND VERIFIED WITH: J,MILLEN PHARMD @0958  12/19/20 EB    Enterobacter cloacae complex NOT DETECTED NOT DETECTED Final   Escherichia coli DETECTED (A) NOT DETECTED Final    Comment: CRITICAL RESULT CALLED TO, READ BACK BY AND VERIFIED WITH: J,MILLEN PHARMD @0958  12/19/20 EB    Klebsiella aerogenes NOT DETECTED NOT DETECTED Final   Klebsiella oxytoca NOT DETECTED NOT DETECTED Final   Klebsiella pneumoniae NOT DETECTED NOT DETECTED Final   Proteus species NOT DETECTED NOT DETECTED Final   Salmonella species NOT DETECTED NOT DETECTED Final   Serratia marcescens NOT DETECTED NOT DETECTED Final   Haemophilus influenzae NOT DETECTED NOT DETECTED Final   Neisseria meningitidis NOT DETECTED NOT DETECTED Final   Pseudomonas aeruginosa NOT DETECTED NOT DETECTED Final   Stenotrophomonas maltophilia NOT DETECTED NOT DETECTED Final   Candida albicans NOT DETECTED NOT DETECTED Final   Candida auris NOT DETECTED NOT DETECTED Final   Candida glabrata NOT DETECTED NOT DETECTED Final    Candida krusei NOT DETECTED NOT DETECTED Final   Candida parapsilosis NOT DETECTED NOT DETECTED Final   Candida tropicalis NOT DETECTED NOT DETECTED Final   Cryptococcus neoformans/gattii NOT DETECTED NOT DETECTED Final   CTX-M ESBL NOT DETECTED NOT DETECTED Final   Carbapenem resistance IMP NOT DETECTED NOT DETECTED Final   Carbapenem resistance KPC NOT DETECTED NOT DETECTED Final   Carbapenem resistance NDM NOT DETECTED NOT DETECTED Final   Carbapenem resist OXA 48 LIKE NOT DETECTED NOT DETECTED Final   Carbapenem resistance VIM NOT DETECTED NOT DETECTED Final    Comment: Performed at [facility], [address], [place], [place] [address]  Blood Culture (routine x 2)     Status: Abnormal   Collection Time: 12/18/20  8:10 PM   Specimen: BLOOD  Result Value Ref Range Status   Specimen Description BLOOD SITE NOT SPECIFIED  Final   Special Requests AEROBIC BOTTLE ONLY Blood Culture adequate volume  Final   Culture  Setup Time  Final    GRAM NEGATIVE RODS AEROBIC BOTTLE ONLY CRITICAL VALUE NOTED.  VALUE IS CONSISTENT WITH PREVIOUSLY REPORTED AND CALLED VALUE.    Culture (A)  Final    ESCHERICHIA COLI SUSCEPTIBILITIES PERFORMED ON PREVIOUS CULTURE WITHIN THE LAST 5 DAYS. Performed at [facility], [place] [address] [place], [place] [address]    Report Status 12/21/2020 FINAL  Final  Resp Panel by RT-PCR (Flu A&B, Covid) Nasopharyngeal Swab     Status: None   Collection Time: 12/18/20  9:10 PM   Specimen: Nasopharyngeal Swab; Nasopharyngeal(NP) swabs in vial transport medium  Result Value Ref Range Status   SARS Coronavirus 2 by RT PCR NEGATIVE NEGATIVE Final    Comment: (NOTE) SARS-CoV-2 target nucleic acids are NOT DETECTED.  The SARS-CoV-2 RNA is generally detectable in upper respiratory specimens during the acute phase of infection. The lowest concentration of SARS-CoV-2 viral copies this assay can detect is 138 copies/mL. A negative result does not preclude  SARS-Cov-2 infection and should not be used as the sole basis for treatment or other patient management decisions. A negative result may occur with  improper specimen collection/handling, submission of specimen other than nasopharyngeal swab, presence of viral mutation(s) within the areas targeted by this assay, and inadequate number of viral copies(<138 copies/mL). A negative result must be combined with clinical observations, patient history, and epidemiological information. The expected result is Negative.  Fact Sheet for Patients:  EntrepreneurPulse.com.au  Fact Sheet for Healthcare Providers:  IncredibleEmployment.be  This test is no t yet approved or cleared by the [place] FDA and  has been authorized for detection and/or diagnosis of SARS-CoV-2 by FDA under an Emergency Use Authorization (EUA). This EUA will remain  in effect (meaning this test can be used) for the duration of the COVID-19 declaration under Section 564(b)(1) of the Act, 21 U.S.C.section 360bbb-3(b)(1), unless the authorization is terminated  or revoked sooner.       Influenza A by PCR NEGATIVE NEGATIVE Final   Influenza B by PCR NEGATIVE NEGATIVE Final    Comment: (NOTE) The Xpert Xpress SARS-CoV-2/FLU/RSV plus assay is intended as an aid in the diagnosis of influenza from Nasopharyngeal swab specimens and should not be used as a sole basis for treatment. Nasal washings and aspirates are unacceptable for Xpert Xpress SARS-CoV-2/FLU/RSV testing.  Fact Sheet for Patients: EntrepreneurPulse.com.au  Fact Sheet for Healthcare Providers: IncredibleEmployment.be  This test is not yet approved or cleared by the [place] FDA and has been authorized for detection and/or diagnosis of SARS-CoV-2 by FDA under an Emergency Use Authorization (EUA). This EUA will remain in effect (meaning this test can be used) for the duration of  the COVID-19 declaration under Section 564(b)(1) of the Act, 21 U.S.C. section 360bbb-3(b)(1), unless the authorization is terminated or revoked.  Performed at [facility], [place] [address], [place], [place] [address]   MRSA Next Gen by PCR, Nasal     Status: Abnormal   Collection Time: 12/19/20 12:27 AM   Specimen: Nasal Mucosa; Nasal Swab  Result Value Ref Range Status   MRSA by PCR Next Gen DETECTED (A) NOT DETECTED Final    Comment: RESULT CALLED TO, READ BACK BY AND VERIFIED WITH: C MAS,RN@[id number]  12/19/20 [place] (NOTE) The GeneXpert MRSA Assay (FDA approved for NASAL specimens only), is one component of a comprehensive MRSA colonization surveillance program. It is not intended to diagnose MRSA infection nor to guide or monitor treatment for MRSA infections. Test performance is not FDA approved  in patients less than 53 years old. Performed at [facility], [place] [address], [place], [place] [address]   Urine Culture     Status: Abnormal   Collection Time: 12/19/20 12:38 AM   Specimen: In/Out Cath Urine  Result Value Ref Range Status   Specimen Description IN/OUT CATH URINE  Final   Special Requests   Final    NONE Performed at [facility], [place] [address] [place], [place] [address]    Culture 1,000 COLONIES/mL ESCHERICHIA COLI (A)  Final   Report Status 12/21/2020 FINAL  Final   Organism ID, Bacteria ESCHERICHIA COLI (A)  Final      Susceptibility   Escherichia coli - MIC*    AMPICILLIN 8 SENSITIVE Sensitive     CEFAZOLIN <=4 SENSITIVE Sensitive     CEFEPIME <=0.12 SENSITIVE Sensitive     CEFTRIAXONE <=0.25 SENSITIVE Sensitive     CIPROFLOXACIN <=0.25 SENSITIVE Sensitive     GENTAMICIN <=1 SENSITIVE Sensitive     IMIPENEM <=0.25 SENSITIVE Sensitive     NITROFURANTOIN <=16 SENSITIVE Sensitive     TRIMETH/SULFA <=20 SENSITIVE Sensitive     AMPICILLIN/SULBACTAM 4 SENSITIVE Sensitive     PIP/TAZO <=4 SENSITIVE Sensitive     * 1,000 COLONIES/mL ESCHERICHIA  COLI  Respiratory (~20 pathogens) panel by PCR     Status: None   Collection Time: 12/19/20  9:07 AM   Specimen: Respiratory  Result Value Ref Range Status   Adenovirus NOT DETECTED NOT DETECTED Final   Coronavirus 229E NOT DETECTED NOT DETECTED Final    Comment: (NOTE) The Coronavirus on the Respiratory Panel, DOES NOT test for the novel  Coronavirus (2019 nCoV)    Coronavirus HKU1 NOT DETECTED NOT DETECTED Final   Coronavirus NL63 NOT DETECTED NOT DETECTED Final   Coronavirus OC43 NOT DETECTED NOT DETECTED Final   Metapneumovirus NOT DETECTED NOT DETECTED Final   Rhinovirus / Enterovirus NOT DETECTED NOT DETECTED Final   Influenza A NOT DETECTED NOT DETECTED Final   Influenza B NOT DETECTED NOT DETECTED Final   Parainfluenza Virus 1 NOT DETECTED NOT DETECTED Final   Parainfluenza Virus 2 NOT DETECTED NOT DETECTED Final   Parainfluenza Virus 3 NOT DETECTED NOT DETECTED Final   Parainfluenza Virus 4 NOT DETECTED NOT DETECTED Final   Respiratory Syncytial Virus NOT DETECTED NOT DETECTED Final   Bordetella pertussis NOT DETECTED NOT DETECTED Final   Bordetella Parapertussis NOT DETECTED NOT DETECTED Final   Chlamydophila pneumoniae NOT DETECTED NOT DETECTED Final   Mycoplasma pneumoniae NOT DETECTED NOT DETECTED Final    Comment: Performed at [facility], [place]. [address] [place], [place] [address]  Culture, blood (Routine X 2) w Reflex to ID Panel     Status: None (Preliminary result)   Collection Time: 12/20/20  2:28 PM   Specimen: BLOOD LEFT HAND  Result Value Ref Range Status   Specimen Description BLOOD LEFT HAND  Final   Special Requests   Final    BOTTLES DRAWN AEROBIC ONLY Blood Culture results may not be optimal due to an inadequate volume of blood received in culture bottles   Culture   Final    NO GROWTH 3 DAYS Performed at [facility], [place] [address], [place], [place] [address]    Report Status PENDING  Incomplete  Culture, blood (Routine X 2) w Reflex  to ID Panel     Status: None (Preliminary result)   Collection Time: 12/20/20  2:31 PM  Specimen: BLOOD RIGHT HAND  Result Value Ref Range Status   Specimen Description BLOOD RIGHT HAND  Final   Special Requests   Final    BOTTLES DRAWN AEROBIC AND ANAEROBIC Blood Culture adequate volume   Culture   Final    NO GROWTH 3 DAYS Performed at [facility], [address], [place], [place] [address]    Report Status PENDING  Incomplete    Studies/Results: ECHOCARDIOGRAM LIMITED  Result Date: 12/21/2020    ECHOCARDIOGRAM LIMITED REPORT   Patient Name:   Jillian Leblanc Date of Exam: 12/21/2020 Medical Rec #:  [id number]      Height:       60.0 in Accession #:    [id number]     Weight:       214.3 lb Date of Birth:  [birth year]      BSA:          1.922 m Patient Age:    75 years       BP:           99/65 mmHg Patient Gender: F              HR:           123 bpm. Exam Location:  Inpatient Procedure: Limited Echo, Color Doppler and Cardiac Doppler Indications:    Bacteremia  History:        Patient has prior history of Echocardiogram examinations, most                 recent 11/24/2020. CHF, Arrythmias:Dysrhythmia; Risk                 Factors:Diabetes.  Sonographer:    Maudry Mayhew MHA, RDMS, RVT, RDCS Referring Phys: [place]  1. LVEF appears moderately depressed. Endocardium is not well seen in all segments. WOuld recomm limited echo with Definity to confirm wall motion and LVEF . Left ventricular diastolic parameters are indeterminate.  2. Right ventricular systolic function is normal. The right ventricular size is mildly enlarged.  3. Right atrial size was mildly dilated.  4. Trivial mitral valve regurgitation.  5. Tricuspid valve regurgitation is moderate.  6. The aortic valve is tricuspid. Aortic valve regurgitation is not visualized. Aortic valve sclerosis is present, with no evidence of aortic valve stenosis. FINDINGS  Left Ventricle: LVEF appears moderately  depressed. Endocardium is not well seen in all segments. WOuld recomm limited echo with Definity to confirm wall motion and LVEF. The left ventricular internal cavity size was normal in size. Left ventricular diastolic parameters are indeterminate. Right Ventricle: The right ventricular size is mildly enlarged. Right vetricular wall thickness was not assessed. Right ventricular systolic function is normal. Left Atrium: Left atrial size was normal in size. Right Atrium: Right atrial size was mildly dilated. Pericardium: There is no evidence of pericardial effusion. Mitral Valve: There is mild thickening of the mitral valve leaflet(s). Mild to moderate mitral annular calcification. Trivial mitral valve regurgitation. Tricuspid Valve: The tricuspid valve is normal in structure. Tricuspid valve regurgitation is moderate. Aortic Valve: The aortic valve is tricuspid. Aortic valve regurgitation is not visualized. Aortic valve sclerosis is present, with no evidence of aortic valve stenosis. Pulmonic Valve: The pulmonic valve was grossly normal. Pulmonic valve regurgitation is not visualized. Additional Comments: A device lead is visualized. TRICUSPID VALVE TR Peak grad:   40.4 mmHg TR Vmax:        318.00 cm/s Dorris Carnes MD Electronically signed by Nevin Bloodgood  Ross MD Signature Date/Time: 12/21/2020/5:00:32 PM    Final       Assessment/Plan:  INTERVAL HISTORY:   Patient continues to improve   Principal Problem:   Gram-negative bacteremia Active Problems:   AKI (acute kidney injury) ([place])   Type 2 diabetes mellitus with hypoglycemia without coma (H[facility])   Hypothyroidism   Severe sepsis (HCC)   Acute metabolic encephalopathy   Acute hyponatremia   Thrombocytopenia (HCC)   Chronic anemia   Chronic diastolic CHF (congestive heart failure) (HCC)   Discitis of lumbar region   Sepsis with acute renal failure without septic shock ([place])   Wheeze   Pancytopenia (HCC)    Jillian Leblanc is a 75 y.o. female with  multiple medical problems including diabetes mellitus peripheral neuropathy chronic struct systolic heart failure pacemaker diabetes mellitus admitted to [facility] after falling acute renal failure with multiple metabolic derangements.  She had subsequent falls and CT scan in [place] what was an apparent burst fracture at L5 vertebral body.  In the interim after she went home she came back with worsening back pain fevers followed by confusion and some somnolence.  In the ER she was febrile and with septic shock and endorgan failure.  CT of the abdomen pelvis performed that shows infection the L4 S1 with gas suspicious for osteomyelitis and infection in the spine.  Her pacemaker and her urinary device apparently made MRI not possible.  Radiology could not safely perform IR guided aspirate of disc initially.  She has also been medically too unstable to undergo neurosurgery.  Radiology reads out to me yesterday and now felt more comfortable aspirating this .  I think given the fact that the patient has an E. coli bacteremia that this is the likely culprit and that IR guided aspirate will not be very likely to be fruitful.  #1 E. coli bacteremia with vertebral infection:  Continue cefazolin Repeat blood cultures are no growth I reached out to cardiology to consider transesophageal echocardiogram to evaluate her pacemaker for infection  Continue neurological checks  She will need 6 weeks of effective treatment for her spine infection  #2  Thrombocytopenia this is improving  #3 ARF: resolved  BMP Latest Ref Rng & Units 12/23/2020 12/22/2020 12/21/2020  Glucose 70 - 99 mg/dL 224(H) 218(H) 127(H)  BUN 8 - 23 mg/dL 24(H) 21 17  Creatinine 0.44 - 1.00 mg/dL 1.12(H) 1.11(H) 1.07(H)  Sodium 135 - 145 mmol/L 128(L) 128(L) 127(L)  Potassium 3.5 - 5.1 mmol/L 4.7 5.0 3.9  Chloride 98 - 111 mmol/L 95(L) 99 98  CO2 22 - 32 mmol/L 22 20(L) 21(L)  Calcium 8.9 - 10.3 mg/dL 8.8(L) 8.1(L) 7.6(L)      I spent 36 minutes with the patient including  face to face counseling of the patient regarding her bacteremia spine infection concern for device infection, personally reviewing chest x-ray CBC CMP updated cultures along with review of medical records in preparation for the visit and during the visit and in coordination of her care.      LOS: 5 days   Alcide Evener 12/23/2020, 1:38 PM

## 2020-12-24 DIAGNOSIS — A419 Sepsis, unspecified organism: Secondary | ICD-10-CM | POA: Diagnosis not present

## 2020-12-24 DIAGNOSIS — G9341 Metabolic encephalopathy: Secondary | ICD-10-CM | POA: Diagnosis not present

## 2020-12-24 DIAGNOSIS — R7881 Bacteremia: Secondary | ICD-10-CM | POA: Diagnosis not present

## 2020-12-24 DIAGNOSIS — E871 Hypo-osmolality and hyponatremia: Secondary | ICD-10-CM | POA: Diagnosis not present

## 2020-12-24 LAB — CBC WITH DIFFERENTIAL/PLATELET
Abs Immature Granulocytes: 0.08 10*3/uL — ABNORMAL HIGH (ref 0.00–0.07)
Basophils Absolute: 0 10*3/uL (ref 0.0–0.1)
Basophils Relative: 0 %
Eosinophils Absolute: 0 10*3/uL (ref 0.0–0.5)
Eosinophils Relative: 0 %
HCT: 27.7 % — ABNORMAL LOW (ref 36.0–46.0)
Hemoglobin: 9.4 g/dL — ABNORMAL LOW (ref 12.0–15.0)
Immature Granulocytes: 1 %
Lymphocytes Relative: 11 %
Lymphs Abs: 0.7 10*3/uL (ref 0.7–4.0)
MCH: 28.7 pg (ref 26.0–34.0)
MCHC: 33.9 g/dL (ref 30.0–36.0)
MCV: 84.5 fL (ref 80.0–100.0)
Monocytes Absolute: 0.2 10*3/uL (ref 0.1–1.0)
Monocytes Relative: 3 %
Neutro Abs: 5.6 10*3/uL (ref 1.7–7.7)
Neutrophils Relative %: 85 %
Platelets: 221 10*3/uL (ref 150–400)
RBC: 3.28 MIL/uL — ABNORMAL LOW (ref 3.87–5.11)
RDW: 15.7 % — ABNORMAL HIGH (ref 11.5–15.5)
WBC: 6.5 10*3/uL (ref 4.0–10.5)
nRBC: 0 % (ref 0.0–0.2)

## 2020-12-24 LAB — BASIC METABOLIC PANEL
Anion gap: 11 (ref 5–15)
BUN: 27 mg/dL — ABNORMAL HIGH (ref 8–23)
CO2: 25 mmol/L (ref 22–32)
Calcium: 8.9 mg/dL (ref 8.9–10.3)
Chloride: 94 mmol/L — ABNORMAL LOW (ref 98–111)
Creatinine, Ser: 1.15 mg/dL — ABNORMAL HIGH (ref 0.44–1.00)
GFR, Estimated: 50 mL/min — ABNORMAL LOW (ref >60)
Glucose, Bld: 219 mg/dL — ABNORMAL HIGH (ref 70–99)
Potassium: 6.1 mmol/L — ABNORMAL HIGH (ref 3.5–5.1)
Sodium: 130 mmol/L — ABNORMAL LOW (ref 135–145)

## 2020-12-24 LAB — GLUCOSE, CAPILLARY
Glucose-Capillary: 121 mg/dL — ABNORMAL HIGH (ref 70–99)
Glucose-Capillary: 122 mg/dL — ABNORMAL HIGH (ref 70–99)
Glucose-Capillary: 123 mg/dL — ABNORMAL HIGH (ref 70–99)
Glucose-Capillary: 170 mg/dL — ABNORMAL HIGH (ref 70–99)
Glucose-Capillary: 208 mg/dL — ABNORMAL HIGH (ref 70–99)
Glucose-Capillary: 256 mg/dL — ABNORMAL HIGH (ref 70–99)
Glucose-Capillary: 313 mg/dL — ABNORMAL HIGH (ref 70–99)

## 2020-12-24 LAB — POTASSIUM
Potassium: 4.9 mmol/L (ref 3.5–5.1)
Potassium: 4.4 mmol/L (ref 3.5–5.1)
Potassium: 5.1 mmol/L (ref 3.5–5.1)

## 2020-12-24 MED ORDER — SODIUM POLYSTYRENE SULFONATE 15 GM/60ML PO SUSP
15.0000 g | Freq: Once | ORAL | Status: DC
  Filled 2020-12-24: qty 60

## 2020-12-24 NOTE — Care Management Important Message (Signed)
Important Message  Patient Details  Name: Jillian Leblanc MRN: [id number] Date of Birth: 30-Jan-1945   Medicare Important Message Given:  Yes     Dorena Bodo 12/24/2020, 2:13 PM

## 2020-12-24 NOTE — Progress Notes (Signed)
PROGRESS NOTE    Jillian Leblanc  DGL:[id number] DOB: May 10, 1945 DOA: 12/18/2020 PCP: Karle Plumber, MD   Brief Narrative:  75 yo woman who presented with back pain after falls, developed sepsis, Ecoli from Blood cultures, now with hypotension and afib with RVR, fever.  Admitted 11/25 pm with SOB, non productive cough, subjective fever, fatigue, muscle aches, and back pain (hadn't completely resolved after prior recent discharge).   Increasing confusion and intermittent somnolence at home, poor po intake.  Admitted to ICU for septic shock requiring pressors through 12/23/2020 in the setting of E. coli bacteremia initial source concern to be UTI with septic discitis in the setting of recent L5-S1 hardware placement.  Assessment & Plan:    Septic shock secondary to E. coli bacteremia, UTI, septic discitis, POA  -Patient off pressors as of 12/23/2020 -Continue ceftriaxone, 6-week duration via PICC line per discussion with ID, will likely need dispo to SNF for assistance with this in the setting of limited mobility recent surgery and recurrent admissions   Acute respiratory failure with hypoxia, multifactorial Concurrent asthma exacerbation versus COPD exacerbation, resolving Pulmonary edema, resolving -Continue inhaled medication, steroids per previous pulmonology recommendations -Supportive care incentive spirometry, flutter, Mucinex, Tessalon Delsym ongoing -Appears more euvolemic, hold further diuresis in the setting of minimally elevated creatinine  Allergic rhinitis -flonase ongoing -Continue claritin   GERD -PPI daily added   CKD 2; AKI resolved Chronic mild hyponatremia -Appears to be chronic, baseline sodium in the low 130s -renally dose meds, avoid nephrotoxic meds -Hold Lasix   Acute back pain 2/2 discitis & OM, previous compression fracture -Continue home Elavil, as needed Tylenol, lidocaine patch, out of bed as tolerated per surgical recommendations  -PT OT  ongoing, likely disposition SNF -Continue to wean narcotics as possible, continue oxycodone IR with as needed fentanyl IV -Continue gabapentin  Dysphagia -Likely multifactorial in setting of above, follow speech recommendations   Acute metabolic encephalopathy, multifactorial, resolving -In the setting of septic shock, respiratory failure, AKI, narcotics and potential hospital delirium  Provoked Afib with RVR , new onset now resolved. -Likely provoked in the setting of septic shock respiratory failure  -Initially requiring amiodarone for rate control; however, with resolution of septic shock and hypoxic respiratory failure heart rate has returned back to normal sinus/   Acute thrombocytopenia, resolved Chronic anemia, likely of chronic disease -Likely secondary to sepsis/septic shock, follow repeat labs  -Resolving with treatment of above -monitor   Uncontrolled diabetes in the setting of hypergylcemia Exacerbated on steroids -A1c 8.4 October of this year -Continue increased long-acting insulin at 8 units daily, sliding scale insulin, hypoglycemic protocol  Deconditioning -needs PT & OT -currently recommending SNF  DVT prophylaxis: Heparin subcu Code Status: Full Family Communication: None present  Status is: Inpatient  Dispo: The patient is from: Home              Anticipated d/c is to: SNF              Anticipated d/c date is: 48 to 72 hours              Patient currently not medically stable for discharge  Consultants:  PCCM, ID  Procedures:  None  Antimicrobials:  Cefazolin  Subjective: No acute issues or events overnight denies nausea vomiting diarrhea constipation headache fevers chills or chest pain  Objective: Vitals:   12/24/20 0000 12/24/20 0300 12/24/20 0400 12/24/20 0500  BP: (!) 155/67  137/61   Pulse: 60  (!) 59  Resp: 14  (!) 21   Temp:  97.9 F (36.6 C)    TempSrc:  Oral    SpO2: 100%  100%   Weight:    91.1 kg  Height:         Intake/Output Summary (Last 24 hours) at 12/24/2020 0733 Last data filed at 12/24/2020 0700 Gross per 24 hour  Intake 678.22 ml  Output 4960 ml  Net -4281.78 ml   Filed Weights   12/22/20 0359 12/23/20 0500 12/24/20 0500  Weight: 95.5 kg 94.9 kg 91.1 kg    Examination:  General:  Pleasantly resting in bed, No acute distress. HEENT:  Normocephalic atraumatic.  Sclerae nonicteric, noninjected.  Extraocular movements intact bilaterally. Neck:  Without mass or deformity.  Trachea is midline. Lungs:  Clear to auscultate bilaterally without rhonchi, wheeze, or rales. Heart:  Regular rate and rhythm.  Without murmurs, rubs, or gallops. Abdomen:  Soft, nontender, nondistended.  Without guarding or rebound. Extremities: Without cyanosis, clubbing, edema, or obvious deformity. Vascular:  Dorsalis pedis and posterior tibial pulses palpable bilaterally. Skin:  Warm and dry, no erythema, no ulcerations.   Data Reviewed: I have personally reviewed following labs and imaging studies  CBC: Recent Labs  Lab 12/19/20 0237 12/19/20 2337 12/20/20 0429 12/21/20 0134 12/22/20 0017 12/24/20 0446  WBC 4.4 2.8* 4.1 3.8* 5.9 6.5  NEUTROABS 4.0  --  3.7 2.7 5.2 5.6  HGB 8.6* 7.6* 8.1* 8.6* 9.1* 9.4*  HCT 26.5* 22.0* 24.2* 24.6* 27.0* 27.7*  MCV 91.4 85.3 85.5 84.8 86.5 84.5  PLT 57* 48* 54* 68* 120*  118* [id number]   Basic Metabolic Panel: Recent Labs  Lab 12/19/20 0237 12/19/20 2337 12/20/20 0429 12/21/20 0134 12/22/20 0017 12/23/20 0052 12/24/20 0446  NA 126* 129* 129* 127* 128* 128* 130*  K 3.8 3.4* 3.6 3.9 5.0 4.7 6.1*  CL 96* 97* 98 98 99 95* 94*  CO2 17* 21* 18* 21* 20* 22 25  GLUCOSE 99 78 85 127* 218* 224* 219*  BUN 35* 28* 25* 17 21 24* 27*  CREATININE 1.98* 1.55* 1.56* 1.07* 1.11* 1.12* 1.15*  CALCIUM 6.8* 7.3* 7.4* 7.6* 8.1* 8.8* 8.9  MG 1.5* 1.7  --   --  2.1  --   --    GFR: Estimated Creatinine Clearance: 43.8 mL/min (A) (by C-G formula based on SCr of 1.15 mg/dL  (H)). Liver Function Tests: Recent Labs  Lab 12/19/20 0237 12/19/20 2337 12/20/20 0429 12/21/20 0134 12/22/20 0017  AST 39 30 29 33 25  ALT 27 23 23 22 22   ALKPHOS 172* 208* 208* 243* 257*  BILITOT 1.8* 1.5* 1.7* 1.1 0.8  PROT 5.7* 5.3* 5.5* 5.8* 6.3*  ALBUMIN 2.2* 1.8* 1.9* 1.8* 1.9*   No results for input(s): LIPASE, AMYLASE in the last 168 hours. No results for input(s): AMMONIA in the last 168 hours. Coagulation Profile: Recent Labs  Lab 12/18/20 1949 12/22/20 0017  INR 1.2 1.1   Cardiac Enzymes: No results for input(s): CKTOTAL, CKMB, CKMBINDEX, TROPONINI in the last 168 hours. BNP (last 3 results) No results for input(s): PROBNP in the last 8760 hours. HbA1C: No results for input(s): HGBA1C in the last 72 hours. CBG: Recent Labs  Lab 12/23/20 2014 12/23/20 2335 12/24/20 0214 12/24/20 0326 12/24/20 0615  GLUCAP 293* 352* 313* 256* 123*   Lipid Profile: No results for input(s): CHOL, HDL, LDLCALC, TRIG, CHOLHDL, LDLDIRECT in the last 72 hours. Thyroid Function Tests: No results for input(s): TSH, T4TOTAL, FREET4, T3FREE, THYROIDAB in the last  72 hours. Anemia Panel: No results for input(s): VITAMINB12, FOLATE, FERRITIN, TIBC, IRON, RETICCTPCT in the last 72 hours. Sepsis Labs: Recent Labs  Lab 12/18/20 1955 12/18/20 2148 12/19/20 0237 12/20/20 0429  PROCALCITON  --   --  16.24  --   LATICACIDVEN 2.1* 1.8  --  1.5    Recent Results (from the past 240 hour(s))  Blood Culture (routine x 2)     Status: Abnormal   Collection Time: 12/18/20  7:49 PM   Specimen: BLOOD  Result Value Ref Range Status   Specimen Description BLOOD RIGHT ANTECUBITAL  Final   Special Requests   Final    BOTTLES DRAWN AEROBIC AND ANAEROBIC Blood Culture results may not be optimal due to an excessive volume of blood received in culture bottles   Culture  Setup Time   Final    GRAM NEGATIVE RODS IN BOTH AEROBIC AND ANAEROBIC BOTTLES CRITICAL RESULT CALLED TO, READ BACK BY AND  VERIFIED WITH: J,MILLEN PHARMD @0958  12/19/20 EB Performed at [facility], [place] [address], [place], [place] [address]    Culture ESCHERICHIA COLI (A)  Final   Report Status 12/21/2020 FINAL  Final   Organism ID, Bacteria ESCHERICHIA COLI  Final      Susceptibility   Escherichia coli - MIC*    AMPICILLIN 8 SENSITIVE Sensitive     CEFAZOLIN <=4 SENSITIVE Sensitive     CEFEPIME <=0.12 SENSITIVE Sensitive     CEFTAZIDIME <=1 SENSITIVE Sensitive     CEFTRIAXONE <=0.25 SENSITIVE Sensitive     CIPROFLOXACIN <=0.25 SENSITIVE Sensitive     GENTAMICIN <=1 SENSITIVE Sensitive     IMIPENEM <=0.25 SENSITIVE Sensitive     TRIMETH/SULFA <=20 SENSITIVE Sensitive     AMPICILLIN/SULBACTAM <=2 SENSITIVE Sensitive     PIP/TAZO <=4 SENSITIVE Sensitive     * ESCHERICHIA COLI  Blood Culture ID Panel (Reflexed)     Status: Abnormal   Collection Time: 12/18/20  7:49 PM  Result Value Ref Range Status   Enterococcus faecalis NOT DETECTED NOT DETECTED Final   Enterococcus Faecium NOT DETECTED NOT DETECTED Final   Listeria monocytogenes NOT DETECTED NOT DETECTED Final   Staphylococcus species NOT DETECTED NOT DETECTED Final   Staphylococcus aureus (BCID) NOT DETECTED NOT DETECTED Final   Staphylococcus epidermidis NOT DETECTED NOT DETECTED Final   Staphylococcus lugdunensis NOT DETECTED NOT DETECTED Final   Streptococcus species NOT DETECTED NOT DETECTED Final   Streptococcus agalactiae NOT DETECTED NOT DETECTED Final   Streptococcus pneumoniae NOT DETECTED NOT DETECTED Final   Streptococcus pyogenes NOT DETECTED NOT DETECTED Final   A.calcoaceticus-baumannii NOT DETECTED NOT DETECTED Final   Bacteroides fragilis NOT DETECTED NOT DETECTED Final   Enterobacterales DETECTED (A) NOT DETECTED Final    Comment: Enterobacterales represent a large order of gram negative bacteria, not a single organism. CRITICAL RESULT CALLED TO, READ BACK BY AND VERIFIED WITH: J,MILLEN PHARMD @0958  12/19/20 EB     Enterobacter cloacae complex NOT DETECTED NOT DETECTED Final   Escherichia coli DETECTED (A) NOT DETECTED Final    Comment: CRITICAL RESULT CALLED TO, READ BACK BY AND VERIFIED WITH: J,MILLEN PHARMD @0958  12/19/20 EB    Klebsiella aerogenes NOT DETECTED NOT DETECTED Final   Klebsiella oxytoca NOT DETECTED NOT DETECTED Final   Klebsiella pneumoniae NOT DETECTED NOT DETECTED Final   Proteus species NOT DETECTED NOT DETECTED Final   Salmonella species NOT DETECTED NOT DETECTED Final   Serratia marcescens NOT DETECTED NOT DETECTED Final   Haemophilus influenzae NOT  DETECTED NOT DETECTED Final   Neisseria meningitidis NOT DETECTED NOT DETECTED Final   Pseudomonas aeruginosa NOT DETECTED NOT DETECTED Final   Stenotrophomonas maltophilia NOT DETECTED NOT DETECTED Final   Candida albicans NOT DETECTED NOT DETECTED Final   Candida auris NOT DETECTED NOT DETECTED Final   Candida glabrata NOT DETECTED NOT DETECTED Final   Candida krusei NOT DETECTED NOT DETECTED Final   Candida parapsilosis NOT DETECTED NOT DETECTED Final   Candida tropicalis NOT DETECTED NOT DETECTED Final   Cryptococcus neoformans/gattii NOT DETECTED NOT DETECTED Final   CTX-M ESBL NOT DETECTED NOT DETECTED Final   Carbapenem resistance IMP NOT DETECTED NOT DETECTED Final   Carbapenem resistance KPC NOT DETECTED NOT DETECTED Final   Carbapenem resistance NDM NOT DETECTED NOT DETECTED Final   Carbapenem resist OXA 48 LIKE NOT DETECTED NOT DETECTED Final   Carbapenem resistance VIM NOT DETECTED NOT DETECTED Final    Comment: Performed at [facility], [address], [place], [place] [address]  Blood Culture (routine x 2)     Status: Abnormal   Collection Time: 12/18/20  8:10 PM   Specimen: BLOOD  Result Value Ref Range Status   Specimen Description BLOOD SITE NOT SPECIFIED  Final   Special Requests AEROBIC BOTTLE ONLY Blood Culture adequate volume  Final   Culture  Setup Time   Final    GRAM NEGATIVE RODS AEROBIC  BOTTLE ONLY CRITICAL VALUE NOTED.  VALUE IS CONSISTENT WITH PREVIOUSLY REPORTED AND CALLED VALUE.    Culture (A)  Final    ESCHERICHIA COLI SUSCEPTIBILITIES PERFORMED ON PREVIOUS CULTURE WITHIN THE LAST 5 DAYS. Performed at [facility], [address] [place], [place] [address]    Report Status 12/21/2020 FINAL  Final  Resp Panel by RT-PCR (Flu A&B, Covid) Nasopharyngeal Swab     Status: None   Collection Time: 12/18/20  9:10 PM   Specimen: Nasopharyngeal Swab; Nasopharyngeal(NP) swabs in vial transport medium  Result Value Ref Range Status   SARS Coronavirus 2 by RT PCR NEGATIVE NEGATIVE Final    Comment: (NOTE) SARS-CoV-2 target nucleic acids are NOT DETECTED.  The SARS-CoV-2 RNA is generally detectable in upper respiratory specimens during the acute phase of infection. The lowest concentration of SARS-CoV-2 viral copies this assay can detect is 138 copies/mL. A negative result does not preclude SARS-Cov-2 infection and should not be used as the sole basis for treatment or other patient management decisions. A negative result may occur with  improper specimen collection/handling, submission of specimen other than nasopharyngeal swab, presence of viral mutation(s) within the areas targeted by this assay, and inadequate number of viral copies(<138 copies/mL). A negative result must be combined with clinical observations, patient history, and epidemiological information. The expected result is Negative.  Fact Sheet for Patients:  BloggerCourse.com  Fact Sheet for Healthcare Providers:  SeriousBroker.it  This test is no t yet approved or cleared by the [place] FDA and  has been authorized for detection and/or diagnosis of SARS-CoV-2 by FDA under an Emergency Use Authorization (EUA). This EUA will remain  in effect (meaning this test can be used) for the duration of the COVID-19 declaration under Section 564(b)(1) of  the Act, 21 U.S.C.section 360bbb-3(b)(1), unless the authorization is terminated  or revoked sooner.       Influenza A by PCR NEGATIVE NEGATIVE Final   Influenza B by PCR NEGATIVE NEGATIVE Final    Comment: (NOTE) The Xpert Xpress SARS-CoV-2/FLU/RSV plus assay is intended as an aid  in the diagnosis of influenza from Nasopharyngeal swab specimens and should not be used as a sole basis for treatment. Nasal washings and aspirates are unacceptable for Xpert Xpress SARS-CoV-2/FLU/RSV testing.  Fact Sheet for Patients: EntrepreneurPulse.com.au  Fact Sheet for Healthcare Providers: IncredibleEmployment.be  This test is not yet approved or cleared by the [place] FDA and has been authorized for detection and/or diagnosis of SARS-CoV-2 by FDA under an Emergency Use Authorization (EUA). This EUA will remain in effect (meaning this test can be used) for the duration of the COVID-19 declaration under Section 564(b)(1) of the Act, 21 U.S.C. section 360bbb-3(b)(1), unless the authorization is terminated or revoked.  Performed at [facility], [place] [address], [place], [place] [address]   MRSA Next Gen by PCR, Nasal     Status: Abnormal   Collection Time: 12/19/20 12:27 AM   Specimen: Nasal Mucosa; Nasal Swab  Result Value Ref Range Status   MRSA by PCR Next Gen DETECTED (A) NOT DETECTED Final    Comment: RESULT CALLED TO, READ BACK BY AND VERIFIED WITH: C MAS,RN@[id number]  12/19/20 [place] (NOTE) The GeneXpert MRSA Assay (FDA approved for NASAL specimens only), is one component of a comprehensive MRSA colonization surveillance program. It is not intended to diagnose MRSA infection nor to guide or monitor treatment for MRSA infections. Test performance is not FDA approved in patients less than 56 years old. Performed at [facility], [place] [address], [place], [place] [address]   Urine Culture     Status: Abnormal   Collection Time: 12/19/20  12:38 AM   Specimen: In/Out Cath Urine  Result Value Ref Range Status   Specimen Description IN/OUT CATH URINE  Final   Special Requests   Final    NONE Performed at [facility], [place] [address], [place], [place] [address]    Culture 1,000 COLONIES/mL ESCHERICHIA COLI (A)  Final   Report Status 12/21/2020 FINAL  Final   Organism ID, Bacteria ESCHERICHIA COLI (A)  Final      Susceptibility   Escherichia coli - MIC*    AMPICILLIN 8 SENSITIVE Sensitive     CEFAZOLIN <=4 SENSITIVE Sensitive     CEFEPIME <=0.12 SENSITIVE Sensitive     CEFTRIAXONE <=0.25 SENSITIVE Sensitive     CIPROFLOXACIN <=0.25 SENSITIVE Sensitive     GENTAMICIN <=1 SENSITIVE Sensitive     IMIPENEM <=0.25 SENSITIVE Sensitive     NITROFURANTOIN <=16 SENSITIVE Sensitive     TRIMETH/SULFA <=20 SENSITIVE Sensitive     AMPICILLIN/SULBACTAM 4 SENSITIVE Sensitive     PIP/TAZO <=4 SENSITIVE Sensitive     * 1,000 COLONIES/mL ESCHERICHIA COLI  Respiratory (~20 pathogens) panel by PCR     Status: None   Collection Time: 12/19/20  9:07 AM   Specimen: Respiratory  Result Value Ref Range Status   Adenovirus NOT DETECTED NOT DETECTED Final   Coronavirus 229E NOT DETECTED NOT DETECTED Final    Comment: (NOTE) The Coronavirus on the Respiratory Panel, DOES NOT test for the novel  Coronavirus (2019 nCoV)    Coronavirus HKU1 NOT DETECTED NOT DETECTED Final   Coronavirus NL63 NOT DETECTED NOT DETECTED Final   Coronavirus OC43 NOT DETECTED NOT DETECTED Final   Metapneumovirus NOT DETECTED NOT DETECTED Final   Rhinovirus / Enterovirus NOT DETECTED NOT DETECTED Final   Influenza A NOT DETECTED NOT DETECTED Final   Influenza B NOT DETECTED NOT DETECTED Final   Parainfluenza Virus 1 NOT DETECTED NOT DETECTED Final   Parainfluenza Virus  2 NOT DETECTED NOT DETECTED Final   Parainfluenza Virus 3 NOT DETECTED NOT DETECTED Final   Parainfluenza Virus 4 NOT DETECTED NOT DETECTED Final   Respiratory Syncytial Virus NOT DETECTED NOT  DETECTED Final   Bordetella pertussis NOT DETECTED NOT DETECTED Final   Bordetella Parapertussis NOT DETECTED NOT DETECTED Final   Chlamydophila pneumoniae NOT DETECTED NOT DETECTED Final   Mycoplasma pneumoniae NOT DETECTED NOT DETECTED Final    Comment: Performed at [facility], [place] [address], [place], [place] [address]  Culture, blood (Routine X 2) w Reflex to ID Panel     Status: None (Preliminary result)   Collection Time: 12/20/20  2:28 PM   Specimen: BLOOD LEFT HAND  Result Value Ref Range Status   Specimen Description BLOOD LEFT HAND  Final   Special Requests   Final    BOTTLES DRAWN AEROBIC ONLY Blood Culture results may not be optimal due to an inadequate volume of blood received in culture bottles   Culture   Final    NO GROWTH 4 DAYS Performed at [facility], [place] [address], [place], [place] [address]    Report Status PENDING  Incomplete  Culture, blood (Routine X 2) w Reflex to ID Panel     Status: None (Preliminary result)   Collection Time: 12/20/20  2:31 PM   Specimen: BLOOD RIGHT HAND  Result Value Ref Range Status   Specimen Description BLOOD RIGHT HAND  Final   Special Requests   Final    BOTTLES DRAWN AEROBIC AND ANAEROBIC Blood Culture adequate volume   Culture   Final    NO GROWTH 4 DAYS Performed at [facility], [place] [address], [place], [place] [address]    Report Status PENDING  Incomplete         Radiology Studies: No results found.      Scheduled Meds:  acetaminophen  650 mg Oral Once   acetaminophen  650 mg Oral Q6H   amitriptyline  100 mg Oral QHS   arformoterol  15 mcg Nebulization BID   calcium carbonate  800 mg of elemental calcium Oral BID   calcium-vitamin D  0.5 tablet Oral BID   Chlorhexidine Gluconate Cloth  6 each Topical Daily   fentaNYL (SUBLIMAZE) injection  100 mcg Intravenous Once   fluticasone  1 spray Each Nare Daily   gabapentin  300 mg Oral BID   heparin injection (subcutaneous)  5,000 Units  Subcutaneous Q8H   insulin aspart  0-15 Units Subcutaneous Q4H   insulin detemir  8 Units Subcutaneous Daily   levothyroxine  150 mcg Oral Q0600   lidocaine  1 patch Transdermal Daily   loratadine  10 mg Oral Daily   mouth rinse  15 mL Mouth Rinse BID   methylPREDNISolone (SOLU-MEDROL) injection  40 mg Intravenous Q24H   mupirocin ointment  1 application Nasal BID   pantoprazole  40 mg Oral Daily   pramipexole  1.5 mg Oral TID   revefenacin  175 mcg Nebulization Daily   sodium polystyrene  15 g Oral Once   Continuous Infusions:  sodium chloride Stopped (12/21/20 1240)   sodium chloride      ceFAZolin (ANCEF) IV Stopped (12/24/20 0148)     LOS: 6 days   Time spent: 48min  Yolonda Purtle C Dontavia Brand, DO [facility]  If 7PM-7AM, please contact night-coverage [email or web address]  12/24/2020, 7:33 AM

## 2020-12-24 NOTE — Progress Notes (Addendum)
Subjective:  Still with significant back pain and also coughing a fair amount   Antibiotics:  Anti-infectives (From admission, onward)    Start     Dose/Rate Route Frequency Ordered Stop   12/21/20 1800  ceFAZolin (ANCEF) IVPB 2g/100 mL premix        2 g 200 mL/hr over 30 Minutes Intravenous Every 8 hours 12/21/20 1216     12/20/20 1800  cefTRIAXone (ROCEPHIN) 2 g in sodium chloride 0.9 % 100 mL IVPB  Status:  Discontinued        2 g 200 mL/hr over 30 Minutes Intravenous Every 24 hours 12/20/20 0920 12/21/20 1216   12/20/20 0100  meropenem (MERREM) 1 g in sodium chloride 0.9 % 100 mL IVPB  Status:  Discontinued        1 g 200 mL/hr over 30 Minutes Intravenous Every 8 hours 12/20/20 0004 12/20/20 0920   12/19/20 2000  ceFEPIme (MAXIPIME) 2 g in sodium chloride 0.9 % 100 mL IVPB  Status:  Discontinued        2 g 200 mL/hr over 30 Minutes Intravenous Every 24 hours 12/19/20 0057 12/19/20 1023   12/19/20 2000  cefTRIAXone (ROCEPHIN) 2 g in sodium chloride 0.9 % 100 mL IVPB  Status:  Discontinued        2 g 200 mL/hr over 30 Minutes Intravenous Every 24 hours 12/19/20 1023 12/20/20 0004   12/19/20 0057  vancomycin variable dose per unstable renal function (pharmacist dosing)  Status:  Discontinued         Does not apply See admin instructions 12/19/20 0057 12/19/20 1023   12/18/20 2000  ceFEPIme (MAXIPIME) 2 g in sodium chloride 0.9 % 100 mL IVPB        2 g 200 mL/hr over 30 Minutes Intravenous  Once 12/18/20 1955 12/18/20 2050   12/18/20 2000  metroNIDAZOLE (FLAGYL) IVPB 500 mg        500 mg 100 mL/hr over 60 Minutes Intravenous  Once 12/18/20 1955 12/18/20 2210   12/18/20 2000  vancomycin (VANCOREADY) IVPB 1750 mg/350 mL        1,750 mg 175 mL/hr over 120 Minutes Intravenous  Once 12/18/20 1955 12/19/20 0045       Medications: Scheduled Meds:  acetaminophen  650 mg Oral Once   acetaminophen  650 mg Oral Q6H   amitriptyline  100 mg Oral QHS   arformoterol  15 mcg  Nebulization BID   calcium carbonate  800 mg of elemental calcium Oral BID   calcium-vitamin D  0.5 tablet Oral BID   Chlorhexidine Gluconate Cloth  6 each Topical Daily   fentaNYL (SUBLIMAZE) injection  100 mcg Intravenous Once   fluticasone  1 spray Each Nare Daily   gabapentin  300 mg Oral BID   heparin injection (subcutaneous)  5,000 Units Subcutaneous Q8H   insulin aspart  0-15 Units Subcutaneous Q4H   insulin detemir  8 Units Subcutaneous Daily   levothyroxine  150 mcg Oral Q0600   lidocaine  1 patch Transdermal Daily   loratadine  10 mg Oral Daily   mouth rinse  15 mL Mouth Rinse BID   methylPREDNISolone (SOLU-MEDROL) injection  40 mg Intravenous Q24H   pantoprazole  40 mg Oral Daily   pramipexole  1.5 mg Oral TID   revefenacin  175 mcg Nebulization Daily   sodium polystyrene  15 g Oral Once   Continuous Infusions:  sodium chloride Stopped (12/21/20 1240)   sodium chloride  ceFAZolin (ANCEF) IV 2 g (12/24/20 0949)   PRN Meds:.sodium chloride, acetaminophen **OR** acetaminophen, benzonatate, dextromethorphan, docusate sodium, fentaNYL (SUBLIMAZE) injection, guaiFENesin, hydrOXYzine, ipratropium-albuterol, lip balm, methocarbamol, naLOXone (NARCAN)  injection, oxyCODONE, phenol, polyethylene glycol    Objective: Weight change: -3.8 kg  Intake/Output Summary (Last 24 hours) at 12/24/2020 1305 Last data filed at 12/24/2020 0700 Gross per 24 hour  Intake 478.13 ml  Output 4960 ml  Net -4481.87 ml    Blood pressure (!) 145/76, pulse 80, temperature 97.8 F (36.6 C), temperature source Oral, resp. rate 17, height 5' (1.524 m), weight 91.1 kg, SpO2 97 %. Temp:  [96.3 F (35.7 C)-98.4 F (36.9 C)] 97.8 F (36.6 C) (12/01 1126) Pulse Rate:  [58-80] 80 (12/01 1126) Resp:  [6-24] 17 (12/01 1126) BP: (124-160)/(57-79) 145/76 (12/01 1126) SpO2:  [93 %-100 %] 97 % (12/01 1126) Weight:  [91.1 kg] 91.1 kg (12/01 0500)  Physical Exam: Physical Exam Constitutional:       General: She is not in acute distress.    Appearance: She is well-developed. She is obese. She is ill-appearing. She is not diaphoretic.  HENT:     Head: Normocephalic and atraumatic.     Right Ear: External ear normal.     Left Ear: External ear normal.     Mouth/Throat:     Pharynx: No oropharyngeal exudate.  Eyes:     General: No scleral icterus.    Conjunctiva/sclera: Conjunctivae normal.     Pupils: Pupils are equal, round, and reactive to light.  Cardiovascular:     Rate and Rhythm: Regular rhythm. Tachycardia present.     Heart sounds: Normal heart sounds. No murmur heard.   No friction rub. No gallop.  Pulmonary:     Effort: Pulmonary effort is normal. No respiratory distress.     Breath sounds: Wheezing and rhonchi present. No rales.  Abdominal:     General: Bowel sounds are normal. There is no distension.     Palpations: Abdomen is soft.     Tenderness: There is no abdominal tenderness. There is no rebound.  Musculoskeletal:        General: No tenderness. Normal range of motion.  Lymphadenopathy:     Cervical: No cervical adenopathy.  Skin:    General: Skin is warm and dry.     Coloration: Skin is not pale.     Findings: No erythema or rash.  Neurological:     Mental Status: She is alert and oriented to person, place, and time.     Motor: No abnormal muscle tone.  Psychiatric:        Mood and Affect: Mood is anxious and depressed.        Speech: Speech is delayed.        Behavior: Behavior is cooperative.        Thought Content: Thought content normal.        Cognition and Memory: Cognition and memory normal.    Her dorsiflexion is weak bilaterally  CBC:    BMET Recent Labs    12/23/20 0052 12/24/20 0446 12/24/20 0926  NA 128* 130*  --   K 4.7 6.1* 4.9  CL 95* 94*  --   CO2 22 25  --   GLUCOSE 224* 219*  --   BUN 24* 27*  --   CREATININE 1.12* 1.15*  --   CALCIUM 8.8* 8.9  --       Liver Panel  Recent Labs    12/22/20 0017  PROT 6.3*  ALBUMIN 1.9*  AST 25  ALT 22  ALKPHOS 257*  BILITOT 0.8        Sedimentation Rate No results for input(s): ESRSEDRATE in the last 72 hours. C-Reactive Protein No results for input(s): CRP in the last 72 hours.  Micro Results: Recent Results (from the past 720 hour(s))  Resp Panel by RT-PCR (Flu A&B, Covid) Nasopharyngeal Swab     Status: None   Collection Time: 12/04/20  6:24 PM   Specimen: Nasopharyngeal Swab; Nasopharyngeal(NP) swabs in vial transport medium  Result Value Ref Range Status   SARS Coronavirus 2 by RT PCR NEGATIVE NEGATIVE Final    Comment: (NOTE) SARS-CoV-2 target nucleic acids are NOT DETECTED.  The SARS-CoV-2 RNA is generally detectable in upper respiratory specimens during the acute phase of infection. The lowest concentration of SARS-CoV-2 viral copies this assay can detect is 138 copies/mL. A negative result does not preclude SARS-Cov-2 infection and should not be used as the sole basis for treatment or other patient management decisions. A negative result may occur with  improper specimen collection/handling, submission of specimen other than nasopharyngeal swab, presence of viral mutation(s) within the areas targeted by this assay, and inadequate number of viral copies(<138 copies/mL). A negative result must be combined with clinical observations, patient history, and epidemiological information. The expected result is Negative.  Fact Sheet for Patients:  EntrepreneurPulse.com.au  Fact Sheet for Healthcare Providers:  IncredibleEmployment.be  This test is no t yet approved or cleared by the [place] FDA and  has been authorized for detection and/or diagnosis of SARS-CoV-2 by FDA under an Emergency Use Authorization (EUA). This EUA will remain  in effect (meaning this test can be used) for the duration of the COVID-19 declaration under Section 564(b)(1) of the Act, 21 U.S.C.section 360bbb-3(b)(1),  unless the authorization is terminated  or revoked sooner.       Influenza A by PCR NEGATIVE NEGATIVE Final   Influenza B by PCR NEGATIVE NEGATIVE Final    Comment: (NOTE) The Xpert Xpress SARS-CoV-2/FLU/RSV plus assay is intended as an aid in the diagnosis of influenza from Nasopharyngeal swab specimens and should not be used as a sole basis for treatment. Nasal washings and aspirates are unacceptable for Xpert Xpress SARS-CoV-2/FLU/RSV testing.  Fact Sheet for Patients: EntrepreneurPulse.com.au  Fact Sheet for Healthcare Providers: IncredibleEmployment.be  This test is not yet approved or cleared by the [place] FDA and has been authorized for detection and/or diagnosis of SARS-CoV-2 by FDA under an Emergency Use Authorization (EUA). This EUA will remain in effect (meaning this test can be used) for the duration of the COVID-19 declaration under Section 564(b)(1) of the Act, 21 U.S.C. section 360bbb-3(b)(1), unless the authorization is terminated or revoked.  Performed at [facility], [place] [address], [place], [place] [address]   C Difficile Quick Screen w PCR reflex     Status: None   Collection Time: 12/06/20  8:01 AM   Specimen: STOOL  Result Value Ref Range Status   C Diff antigen NEGATIVE NEGATIVE Final   C Diff toxin NEGATIVE NEGATIVE Final   C Diff interpretation No C. difficile detected.  Final    Comment: NEGATIVE Performed at [facility], [place] [address] [place], [place] [address]   Gastrointestinal Panel by PCR , Stool     Status: None   Collection Time: 12/07/20  8:56 AM   Specimen: STOOL  Result Value Ref Range Status   Campylobacter species NOT DETECTED NOT DETECTED Final  Plesimonas shigelloides NOT DETECTED NOT DETECTED Final   Salmonella species NOT DETECTED NOT DETECTED Final   Yersinia enterocolitica NOT DETECTED NOT DETECTED Final   Vibrio species NOT DETECTED NOT DETECTED Final   Vibrio  cholerae NOT DETECTED NOT DETECTED Final   Enteroaggregative E coli (EAEC) NOT DETECTED NOT DETECTED Final   Enteropathogenic E coli (EPEC) NOT DETECTED NOT DETECTED Final   Enterotoxigenic E coli (ETEC) NOT DETECTED NOT DETECTED Final   Shiga like toxin producing E coli (STEC) NOT DETECTED NOT DETECTED Final   Shigella/Enteroinvasive E coli (EIEC) NOT DETECTED NOT DETECTED Final   Cryptosporidium NOT DETECTED NOT DETECTED Final   Cyclospora cayetanensis NOT DETECTED NOT DETECTED Final   Entamoeba histolytica NOT DETECTED NOT DETECTED Final   Giardia lamblia NOT DETECTED NOT DETECTED Final   Adenovirus F40/41 NOT DETECTED NOT DETECTED Final   Astrovirus NOT DETECTED NOT DETECTED Final   Norovirus GI/GII NOT DETECTED NOT DETECTED Final   Rotavirus A NOT DETECTED NOT DETECTED Final   Sapovirus (I, II, IV, and V) NOT DETECTED NOT DETECTED Final    Comment: Performed at [facility], [address] [place], [place] [address]  SARS CORONAVIRUS 2 (TAT 6-24 HRS) Nasopharyngeal Nasopharyngeal Swab     Status: None   Collection Time: 12/08/20 10:12 AM   Specimen: Nasopharyngeal Swab  Result Value Ref Range Status   SARS Coronavirus 2 NEGATIVE NEGATIVE Final    Comment: (NOTE) SARS-CoV-2 target nucleic acids are NOT DETECTED.  The SARS-CoV-2 RNA is generally detectable in upper and lower respiratory specimens during the acute phase of infection. Negative results do not preclude SARS-CoV-2 infection, do not rule out co-infections with other pathogens, and should not be used as the sole basis for treatment or other patient management decisions. Negative results must be combined with clinical observations, patient history, and epidemiological information. The expected result is Negative.  Fact Sheet for Patients: HairSlick.no  Fact Sheet for Healthcare Providers: quierodirigir.com  This test is not yet approved or cleared by  the [place] FDA and  has been authorized for detection and/or diagnosis of SARS-CoV-2 by FDA under an Emergency Use Authorization (EUA). This EUA will remain  in effect (meaning this test can be used) for the duration of the COVID-19 declaration under Se ction 564(b)(1) of the Act, 21 U.S.C. section 360bbb-3(b)(1), unless the authorization is terminated or revoked sooner.  Performed at [facility], [address], [place], [place] [address]   Blood Culture (routine x 2)     Status: Abnormal   Collection Time: 12/18/20  7:49 PM   Specimen: BLOOD  Result Value Ref Range Status   Specimen Description BLOOD RIGHT ANTECUBITAL  Final   Special Requests   Final    BOTTLES DRAWN AEROBIC AND ANAEROBIC Blood Culture results may not be optimal due to an excessive volume of blood received in culture bottles   Culture  Setup Time   Final    GRAM NEGATIVE RODS IN BOTH AEROBIC AND ANAEROBIC BOTTLES CRITICAL RESULT CALLED TO, READ BACK BY AND VERIFIED WITH: J,MILLEN PHARMD @0958  12/19/20 EB Performed at [facility], [address], [place], [place]    Culture ESCHERICHIA COLI (A)  Final   Report Status 12/21/2020 FINAL  Final   Organism ID, Bacteria ESCHERICHIA COLI  Final      Susceptibility   Escherichia coli - MIC*    AMPICILLIN 8 SENSITIVE Sensitive     CEFAZOLIN <=4 SENSITIVE Sensitive  CEFEPIME <=0.12 SENSITIVE Sensitive     CEFTAZIDIME <=1 SENSITIVE Sensitive     CEFTRIAXONE <=0.25 SENSITIVE Sensitive     CIPROFLOXACIN <=0.25 SENSITIVE Sensitive     GENTAMICIN <=1 SENSITIVE Sensitive     IMIPENEM <=0.25 SENSITIVE Sensitive     TRIMETH/SULFA <=20 SENSITIVE Sensitive     AMPICILLIN/SULBACTAM <=2 SENSITIVE Sensitive     PIP/TAZO <=4 SENSITIVE Sensitive     * ESCHERICHIA COLI  Blood Culture ID Panel (Reflexed)     Status: Abnormal   Collection Time: 12/18/20  7:49 PM  Result Value Ref Range Status   Enterococcus faecalis NOT DETECTED NOT DETECTED Final    Enterococcus Faecium NOT DETECTED NOT DETECTED Final   Listeria monocytogenes NOT DETECTED NOT DETECTED Final   Staphylococcus species NOT DETECTED NOT DETECTED Final   Staphylococcus aureus (BCID) NOT DETECTED NOT DETECTED Final   Staphylococcus epidermidis NOT DETECTED NOT DETECTED Final   Staphylococcus lugdunensis NOT DETECTED NOT DETECTED Final   Streptococcus species NOT DETECTED NOT DETECTED Final   Streptococcus agalactiae NOT DETECTED NOT DETECTED Final   Streptococcus pneumoniae NOT DETECTED NOT DETECTED Final   Streptococcus pyogenes NOT DETECTED NOT DETECTED Final   A.calcoaceticus-baumannii NOT DETECTED NOT DETECTED Final   Bacteroides fragilis NOT DETECTED NOT DETECTED Final   Enterobacterales DETECTED (A) NOT DETECTED Final    Comment: Enterobacterales represent a large order of gram negative bacteria, not a single organism. CRITICAL RESULT CALLED TO, READ BACK BY AND VERIFIED WITH: J,MILLEN PHARMD @0958  12/19/20 EB    Enterobacter cloacae complex NOT DETECTED NOT DETECTED Final   Escherichia coli DETECTED (A) NOT DETECTED Final    Comment: CRITICAL RESULT CALLED TO, READ BACK BY AND VERIFIED WITH: J,MILLEN PHARMD @0958  12/19/20 EB    Klebsiella aerogenes NOT DETECTED NOT DETECTED Final   Klebsiella oxytoca NOT DETECTED NOT DETECTED Final   Klebsiella pneumoniae NOT DETECTED NOT DETECTED Final   Proteus species NOT DETECTED NOT DETECTED Final   Salmonella species NOT DETECTED NOT DETECTED Final   Serratia marcescens NOT DETECTED NOT DETECTED Final   Haemophilus influenzae NOT DETECTED NOT DETECTED Final   Neisseria meningitidis NOT DETECTED NOT DETECTED Final   Pseudomonas aeruginosa NOT DETECTED NOT DETECTED Final   Stenotrophomonas maltophilia NOT DETECTED NOT DETECTED Final   Candida albicans NOT DETECTED NOT DETECTED Final   Candida auris NOT DETECTED NOT DETECTED Final   Candida glabrata NOT DETECTED NOT DETECTED Final   Candida krusei NOT DETECTED NOT  DETECTED Final   Candida parapsilosis NOT DETECTED NOT DETECTED Final   Candida tropicalis NOT DETECTED NOT DETECTED Final   Cryptococcus neoformans/gattii NOT DETECTED NOT DETECTED Final   CTX-M ESBL NOT DETECTED NOT DETECTED Final   Carbapenem resistance IMP NOT DETECTED NOT DETECTED Final   Carbapenem resistance KPC NOT DETECTED NOT DETECTED Final   Carbapenem resistance NDM NOT DETECTED NOT DETECTED Final   Carbapenem resist OXA 48 LIKE NOT DETECTED NOT DETECTED Final   Carbapenem resistance VIM NOT DETECTED NOT DETECTED Final    Comment: Performed at [facility], [address], [place], [place] [address]  Blood Culture (routine x 2)     Status: Abnormal   Collection Time: 12/18/20  8:10 PM   Specimen: BLOOD  Result Value Ref Range Status   Specimen Description BLOOD SITE NOT SPECIFIED  Final   Special Requests AEROBIC BOTTLE ONLY Blood Culture adequate volume  Final   Culture  Setup Time   Final    GRAM NEGATIVE RODS AEROBIC  BOTTLE ONLY CRITICAL VALUE NOTED.  VALUE IS CONSISTENT WITH PREVIOUSLY REPORTED AND CALLED VALUE.    Culture (A)  Final    ESCHERICHIA COLI SUSCEPTIBILITIES PERFORMED ON PREVIOUS CULTURE WITHIN THE LAST 5 DAYS. Performed at [facility], [place] [address], [place], [place] [address]    Report Status 12/21/2020 FINAL  Final  Resp Panel by RT-PCR (Flu A&B, Covid) Nasopharyngeal Swab     Status: None   Collection Time: 12/18/20  9:10 PM   Specimen: Nasopharyngeal Swab; Nasopharyngeal(NP) swabs in vial transport medium  Result Value Ref Range Status   SARS Coronavirus 2 by RT PCR NEGATIVE NEGATIVE Final    Comment: (NOTE) SARS-CoV-2 target nucleic acids are NOT DETECTED.  The SARS-CoV-2 RNA is generally detectable in upper respiratory specimens during the acute phase of infection. The lowest concentration of SARS-CoV-2 viral copies this assay can detect is 138 copies/mL. A negative result does not preclude SARS-Cov-2 infection and should not be  used as the sole basis for treatment or other patient management decisions. A negative result may occur with  improper specimen collection/handling, submission of specimen other than nasopharyngeal swab, presence of viral mutation(s) within the areas targeted by this assay, and inadequate number of viral copies(<138 copies/mL). A negative result must be combined with clinical observations, patient history, and epidemiological information. The expected result is Negative.  Fact Sheet for Patients:  EntrepreneurPulse.com.au  Fact Sheet for Healthcare Providers:  IncredibleEmployment.be  This test is no t yet approved or cleared by the [place] FDA and  has been authorized for detection and/or diagnosis of SARS-CoV-2 by FDA under an Emergency Use Authorization (EUA). This EUA will remain  in effect (meaning this test can be used) for the duration of the COVID-19 declaration under Section 564(b)(1) of the Act, 21 U.S.C.section 360bbb-3(b)(1), unless the authorization is terminated  or revoked sooner.       Influenza A by PCR NEGATIVE NEGATIVE Final   Influenza B by PCR NEGATIVE NEGATIVE Final    Comment: (NOTE) The Xpert Xpress SARS-CoV-2/FLU/RSV plus assay is intended as an aid in the diagnosis of influenza from Nasopharyngeal swab specimens and should not be used as a sole basis for treatment. Nasal washings and aspirates are unacceptable for Xpert Xpress SARS-CoV-2/FLU/RSV testing.  Fact Sheet for Patients: EntrepreneurPulse.com.au  Fact Sheet for Healthcare Providers: IncredibleEmployment.be  This test is not yet approved or cleared by the [place] FDA and has been authorized for detection and/or diagnosis of SARS-CoV-2 by FDA under an Emergency Use Authorization (EUA). This EUA will remain in effect (meaning this test can be used) for the duration of the COVID-19 declaration under Section  564(b)(1) of the Act, 21 U.S.C. section 360bbb-3(b)(1), unless the authorization is terminated or revoked.  Performed at [facility], [place] [address] [place], [place] [address]   MRSA Next Gen by PCR, Nasal     Status: Abnormal   Collection Time: 12/19/20 12:27 AM   Specimen: Nasal Mucosa; Nasal Swab  Result Value Ref Range Status   MRSA by PCR Next Gen DETECTED (A) NOT DETECTED Final    Comment: RESULT CALLED TO, READ BACK BY AND VERIFIED WITH: C MAS,RN@[id number]  12/19/20 [place] (NOTE) The GeneXpert MRSA Assay (FDA approved for NASAL specimens only), is one component of a comprehensive MRSA colonization surveillance program. It is not intended to diagnose MRSA infection nor to guide or monitor treatment for MRSA infections. Test performance is not FDA approved in patients less than 65 years old. Performed  at [facility], [place] [address], [place], [place] [address]   Urine Culture     Status: Abnormal   Collection Time: 12/19/20 12:38 AM   Specimen: In/Out Cath Urine  Result Value Ref Range Status   Specimen Description IN/OUT CATH URINE  Final   Special Requests   Final    NONE Performed at [facility], [place] [address], [place], [place] [address]    Culture 1,000 COLONIES/mL ESCHERICHIA COLI (A)  Final   Report Status 12/21/2020 FINAL  Final   Organism ID, Bacteria ESCHERICHIA COLI (A)  Final      Susceptibility   Escherichia coli - MIC*    AMPICILLIN 8 SENSITIVE Sensitive     CEFAZOLIN <=4 SENSITIVE Sensitive     CEFEPIME <=0.12 SENSITIVE Sensitive     CEFTRIAXONE <=0.25 SENSITIVE Sensitive     CIPROFLOXACIN <=0.25 SENSITIVE Sensitive     GENTAMICIN <=1 SENSITIVE Sensitive     IMIPENEM <=0.25 SENSITIVE Sensitive     NITROFURANTOIN <=16 SENSITIVE Sensitive     TRIMETH/SULFA <=20 SENSITIVE Sensitive     AMPICILLIN/SULBACTAM 4 SENSITIVE Sensitive     PIP/TAZO <=4 SENSITIVE Sensitive     * 1,000 COLONIES/mL ESCHERICHIA COLI  Respiratory (~20 pathogens) panel  by PCR     Status: None   Collection Time: 12/19/20  9:07 AM   Specimen: Respiratory  Result Value Ref Range Status   Adenovirus NOT DETECTED NOT DETECTED Final   Coronavirus 229E NOT DETECTED NOT DETECTED Final    Comment: (NOTE) The Coronavirus on the Respiratory Panel, DOES NOT test for the novel  Coronavirus (2019 nCoV)    Coronavirus HKU1 NOT DETECTED NOT DETECTED Final   Coronavirus NL63 NOT DETECTED NOT DETECTED Final   Coronavirus OC43 NOT DETECTED NOT DETECTED Final   Metapneumovirus NOT DETECTED NOT DETECTED Final   Rhinovirus / Enterovirus NOT DETECTED NOT DETECTED Final   Influenza A NOT DETECTED NOT DETECTED Final   Influenza B NOT DETECTED NOT DETECTED Final   Parainfluenza Virus 1 NOT DETECTED NOT DETECTED Final   Parainfluenza Virus 2 NOT DETECTED NOT DETECTED Final   Parainfluenza Virus 3 NOT DETECTED NOT DETECTED Final   Parainfluenza Virus 4 NOT DETECTED NOT DETECTED Final   Respiratory Syncytial Virus NOT DETECTED NOT DETECTED Final   Bordetella pertussis NOT DETECTED NOT DETECTED Final   Bordetella Parapertussis NOT DETECTED NOT DETECTED Final   Chlamydophila pneumoniae NOT DETECTED NOT DETECTED Final   Mycoplasma pneumoniae NOT DETECTED NOT DETECTED Final    Comment: Performed at [facility], [place] [place], [place] [address]  Culture, blood (Routine X 2) w Reflex to ID Panel     Status: None (Preliminary result)   Collection Time: 12/20/20  2:28 PM   Specimen: BLOOD LEFT HAND  Result Value Ref Range Status   Specimen Description BLOOD LEFT HAND  Final   Special Requests   Final    BOTTLES DRAWN AEROBIC ONLY Blood Culture results may not be optimal due to an inadequate volume of blood received in culture bottles   Culture   Final    NO GROWTH 4 DAYS Performed at [facility], [place] [address] [place], [place] [address]    Report Status PENDING  Incomplete  Culture, blood (Routine X 2) w Reflex to ID Panel     Status: None (Preliminary  result)   Collection Time: 12/20/20  2:31 PM   Specimen: BLOOD RIGHT HAND  Result Value Ref  Range Status   Specimen Description BLOOD RIGHT HAND  Final   Special Requests   Final    BOTTLES DRAWN AEROBIC AND ANAEROBIC Blood Culture adequate volume   Culture   Final    NO GROWTH 4 DAYS Performed at [facility], [address] [place], [place] [address]    Report Status PENDING  Incomplete    Studies/Results: No results found.    Assessment/Plan:  INTERVAL HISTORY:   Patient coughing more today with worsening pain with coughing  Principal Problem:   Gram-negative bacteremia Active Problems:   AKI (acute kidney injury) ([place])   Type 2 diabetes mellitus with hypoglycemia without coma (HCC)   Hypothyroidism   Severe sepsis (HCC)   Acute metabolic encephalopathy   Acute hyponatremia   Thrombocytopenia (HCC)   Chronic anemia   Chronic diastolic CHF (congestive heart failure) (HCC)   Discitis of lumbar region   Sepsis with acute renal failure without septic shock ([place])   Wheeze   Pancytopenia (HCC)    Jillian Leblanc is a 75 y.o. female with multiple medical problems including diabetes mellitus peripheral neuropathy chronic struct systolic heart failure pacemaker diabetes mellitus admitted to [facility] after falling acute renal failure with multiple metabolic derangements.  She had subsequent falls and CT scan in October [place] what was an apparent burst fracture at L5 vertebral body.  In the interim after she went home she came back with worsening back pain fevers followed by confusion and some somnolence.  In the ER she was febrile and with septic shock and endorgan failure.  CT of the abdomen pelvis performed that shows infection the L4 S1 with gas suspicious for osteomyelitis and infection in the spine.  Her pacemaker and her urinary device apparently made MRI not possible.  Radiology could not safely perform IR guided aspirate of disc initially.  She has also  been medically too unstable to undergo neurosurgery.  Radiology reads out to me 2 days ago and now felt more comfortable aspirating this .  I think given the fact that the patient has an E. coli bacteremia that this is the likely culprit and that IR guided aspirate will not be very likely to be fruitful.  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   Continue neurological checks  Neurosurgery of not felt her to be an operative candidate   #2  Thrombocytopenia: This is improving  #3  Acute renal failure: Resolved  BMP Latest Ref Rng & Units 12/24/2020 12/24/2020 12/23/2020  Glucose 70 - 99 mg/dL - 219(H) 224(H)  BUN 8 - 23 mg/dL - 27(H) 24(H)  Creatinine 0.44 - 1.00 mg/dL - 1.15(H) 1.12(H)  Sodium 135 - 145 mmol/L - 130(L) 128(L)  Potassium 3.5 - 5.1 mmol/L 4.9 6.1(H) 4.7  Chloride 98 - 111 mmol/L - 94(L) 95(L)  CO2 22 - 32 mmol/L - 25 22  Calcium 8.9 - 10.3 mg/dL - 8.9 8.8(L)   #4 Cough wheezing: I wonder if she has some degree of volume overload contributing to this   Jillian Leblanc has  an appointment on 01/20/2021 with Dr. Linus Salmons at   [facility] for Infectious Disease is located in the [facility] at  [place] in [place].  [address], which is located to the left of the elevators.  Phone: [phone]  Fax: [phone]  [email or web address]  She should arrive 15-30 minutes prior to her appointment.  I spent 36 minutes with the patient including  face to  face counseling of the patient regarding her E. coli bacteremia and discitis her pacemaker that is present, reviewing updated blood cultures CBC BMP along with review of medical records in preparation for the visit and during the visit and in coordination of her care.   I will sign off for now.  Please call with further questions or if there is a change in her status for example if she has a transesophageal echocardiogram that shows a vegetation on her device.     LOS: 6 days   Alcide Evener 12/24/2020, 1:05 PM

## 2020-12-24 NOTE — Progress Notes (Signed)
Pt arrived to floor at 0852 via hosptial bed by [facility]. Pt alert and oriented x3 in no acute distress. VSS. Respirations even and unlabored on 1L/min, pt has a strong, dry cough. Assessment completed. Pt oriented to room. Bed in low position and call bell within reach.

## 2020-12-24 NOTE — TOC Progression Note (Addendum)
Transition of Care [facility]) - Progression Note    Patient Details  Name: Jillian Leblanc MRN: [id number] Date of Birth: Dec 16, 1945  Transition of Care [facility]) CM/SW Contact  Mearl Latin, LCSW Phone Number: 12/24/2020, 1:00 PM  Clinical Narrative:    11am-CSW left voicemail for Cheri with [facility] to see if patient would be able to return there at discharge (patient recently discharged from [facility]).   1pm-CSW received call back from [facility]. She stated that patient never actually admitted there. Patient was supposed to arrive at [facility] but instead asked spouse to take her home. [facility] informed her that she could admit by 11am the next morning but patient refused stating she had fallen and did not want to go. Dennard Nip will review referral but if she accepts patient she will require patient to come by non-emergency ambulance.        Barriers to Discharge: Continued Medical Work up  Expected Discharge Plan and Services   In-house Referral: Clinical Social Work   Post Acute Care Choice: Skilled Nursing Facility Living arrangements for the past 2 months: Single Family Home                                       Social Determinants of Health (SDOH) Interventions    Readmission Risk Interventions No flowsheet data found.

## 2020-12-24 NOTE — Progress Notes (Signed)
Speech Language Pathology Treatment: Dysphagia  Patient Details Name: Lafonda Patron MRN: [id number] DOB: February 16, 1945 Today's Date: 12/24/2020 Time: 1000-1017 SLP Time Calculation (min) (ACUTE ONLY): 17 min  Assessment / Plan / Recommendation Clinical Impression  Pt was being repositioned in bed upon SLP arrival with prolonged, dry coughing noted. Coughing seemed to subside once sitting more still and consuming POs. RN had pills to administer, with initial pill given with liquid wash. ORal holding was evident despite repeated boluses of water, but the capsule cleared on the first trial with a bolus of puree. Each additional pill was given one at a time with a bite of puree with no further oral holding observed. Pt consumed an entire container of yogurt with pieces of fruit mixed into it and drank straw sips of water with no overt s/s of aspiration. After trials had ceased and before SLP left the room, pt started to have another episode of coughing. Will continue to monitor given risk factors and baseline cough that does make clinical presentation more challenging to decipher, but given its presence more so in the absence of POs, would favor continuing with current diet.    HPI HPI: 75 y/o female admitted 11/25 with severe sepsis with UTI and possible L5-S1 abscess/osteo. Pt with Afib with RVR, hypotension and AMS with transfer to ICU 11/27. Not currently sx candidate. PMHx:  DM, PPM, neuropathy, falls, L5 fx Oct '22      SLP Plan  Continue with current plan of care      Recommendations for follow up therapy are one component of a multi-disciplinary discharge planning process, led by the attending physician.  Recommendations may be updated based on patient status, additional functional criteria and insurance authorization.    Recommendations  Diet recommendations: Dysphagia 3 (mechanical soft);Thin liquid Liquids provided via: Cup;Straw Medication Administration: Whole meds with  puree Supervision: Staff to assist with self feeding Compensations: Minimize environmental distractions;Slow rate;Small sips/bites Postural Changes and/or Swallow Maneuvers: Seated upright 90 degrees                Oral Care Recommendations: Oral care BID Follow Up Recommendations: Skilled nursing-short term rehab (<3 hours/day) Assistance recommended at discharge: Frequent or constant Supervision/Assistance SLP Visit Diagnosis: Dysphagia, unspecified (R13.10) Plan: Continue with current plan of care       GO                Mahala Menghini., M.A. CCC-SLP Acute Rehabilitation Services Pager [phone] Office [phone]  12/24/2020, 10:31 AM

## 2020-12-25 DIAGNOSIS — R7881 Bacteremia: Secondary | ICD-10-CM | POA: Diagnosis not present

## 2020-12-25 DIAGNOSIS — A419 Sepsis, unspecified organism: Secondary | ICD-10-CM | POA: Diagnosis not present

## 2020-12-25 DIAGNOSIS — G9341 Metabolic encephalopathy: Secondary | ICD-10-CM | POA: Diagnosis not present

## 2020-12-25 DIAGNOSIS — E871 Hypo-osmolality and hyponatremia: Secondary | ICD-10-CM | POA: Diagnosis not present

## 2020-12-25 LAB — CBC WITH DIFFERENTIAL/PLATELET
Abs Immature Granulocytes: 0.11 10*3/uL — ABNORMAL HIGH (ref 0.00–0.07)
Basophils Absolute: 0 10*3/uL (ref 0.0–0.1)
Basophils Relative: 0 %
Eosinophils Absolute: 0 10*3/uL (ref 0.0–0.5)
Eosinophils Relative: 0 %
HCT: 26.8 % — ABNORMAL LOW (ref 36.0–46.0)
Hemoglobin: 9 g/dL — ABNORMAL LOW (ref 12.0–15.0)
Immature Granulocytes: 1 %
Lymphocytes Relative: 5 %
Lymphs Abs: 0.5 10*3/uL — ABNORMAL LOW (ref 0.7–4.0)
MCH: 28.8 pg (ref 26.0–34.0)
MCHC: 33.6 g/dL (ref 30.0–36.0)
MCV: 85.9 fL (ref 80.0–100.0)
Monocytes Absolute: 0.1 10*3/uL (ref 0.1–1.0)
Monocytes Relative: 2 %
Neutro Abs: 8.2 10*3/uL — ABNORMAL HIGH (ref 1.7–7.7)
Neutrophils Relative %: 92 %
Platelets: 237 10*3/uL (ref 150–400)
RBC: 3.12 MIL/uL — ABNORMAL LOW (ref 3.87–5.11)
RDW: 16.1 % — ABNORMAL HIGH (ref 11.5–15.5)
WBC: 8.9 10*3/uL (ref 4.0–10.5)
nRBC: 0 % (ref 0.0–0.2)

## 2020-12-25 LAB — BASIC METABOLIC PANEL
Anion gap: 4 — ABNORMAL LOW (ref 5–15)
BUN: 21 mg/dL (ref 8–23)
CO2: 31 mmol/L (ref 22–32)
Calcium: 8.5 mg/dL — ABNORMAL LOW (ref 8.9–10.3)
Chloride: 97 mmol/L — ABNORMAL LOW (ref 98–111)
Creatinine, Ser: 0.89 mg/dL (ref 0.44–1.00)
GFR, Estimated: >60 mL/min (ref >60)
Glucose, Bld: 192 mg/dL — ABNORMAL HIGH (ref 70–99)
Potassium: 4.5 mmol/L (ref 3.5–5.1)
Sodium: 132 mmol/L — ABNORMAL LOW (ref 135–145)

## 2020-12-25 LAB — CULTURE, BLOOD (ROUTINE X 2)
Culture: NO GROWTH
Culture: NO GROWTH
Special Requests: ADEQUATE

## 2020-12-25 LAB — POTASSIUM: Potassium: 4.7 mmol/L (ref 3.5–5.1)

## 2020-12-25 LAB — GLUCOSE, CAPILLARY
Glucose-Capillary: 107 mg/dL — ABNORMAL HIGH (ref 70–99)
Glucose-Capillary: 149 mg/dL — ABNORMAL HIGH (ref 70–99)
Glucose-Capillary: 204 mg/dL — ABNORMAL HIGH (ref 70–99)
Glucose-Capillary: 139 mg/dL — ABNORMAL HIGH (ref 70–99)
Glucose-Capillary: 177 mg/dL — ABNORMAL HIGH (ref 70–99)

## 2020-12-25 NOTE — Progress Notes (Signed)
Physical Therapy Treatment Patient Details Name: Jillian Leblanc MRN: [id number] DOB: 08-18-1945 Today's Date: 12/25/2020   History of Present Illness 75 y/o female admitted 11/25 with severe sepsis with UTI and possible L5-S1 abscess/osteo. Pt with Afib with RVR, hypotension and AMS with transfer to ICU 11/27. Not currently sx candidate. PMHx:  DM, PPM, neuropathy, falls, L5 fx Oct '22    PT Comments    Pt pleasant appearing very fatigued and reporting wanting to be home. Pt agreeable to movement but significantly less assist offered from pt today with max assist to roll and unable to transition to EOB. Used bed to position to chair with pt tolerating minimal HEP with bil LE due to back and left shoulder pain with pt ultimately returned to supine with bil UE elevated. Pt states to check on her next week for continued participation. HR 89 SpO2 92% on 1L   Recommendations for follow up therapy are one component of a multi-disciplinary discharge planning process, led by the attending physician.  Recommendations may be updated based on patient status, additional functional criteria and insurance authorization.  Follow Up Recommendations  Skilled nursing-short term rehab (<3 hours/day)     Assistance Recommended at Discharge Frequent or constant Supervision/Assistance  Equipment Recommendations  None recommended by PT    Recommendations for Other Services       Precautions / Restrictions Precautions Precautions: Fall;Back Precaution Comments: Back for comfort     Mobility  Bed Mobility Overal bed mobility: Needs Assistance Bed Mobility: Rolling Rolling: Max assist         General bed mobility comments: max assist to roll bil with pt unable to progress to EOB or transfers this session. [facility]    Transfers                   General transfer comment: unable    Ambulation/Gait                   Stairs             Wheelchair  Mobility    Modified Rankin (Stroke Patients Only)       Balance                                            Cognition Arousal/Alertness: Awake/alert Behavior During Therapy: Flat affect Overall Cognitive Status: Impaired/Different from baseline Area of Impairment: Orientation;Attention;Memory;Following commands;Awareness                 Orientation Level: Disoriented to;Time Current Attention Level: Focused Memory: Decreased short-term memory Following Commands: Follows one step commands inconsistently;Follows one step commands with increased time Safety/Judgement: Decreased awareness of deficits;Decreased awareness of safety     General Comments: pt with very slow processing and appears very fatigued        Exercises General Exercises - Lower Extremity Short Arc Quad: AROM;10 reps;Right;Left;Seated;AAROM (AAROM on RLe in chair position in bed) Hip ABduction/ADduction: AAROM;Both;10 reps;Supine    General Comments        Pertinent Vitals/Pain Pain Score: 8  Pain Location: back Pain Descriptors / Indicators: Sore;Discomfort Pain Intervention(s): Limited activity within patient's tolerance;Monitored during session;Repositioned    Home Living                          Prior Function  PT Goals (current goals can now be found in the care plan section) Progress towards PT goals: Not progressing toward goals - comment    Frequency    Min 2X/week      PT Plan Current plan remains appropriate    Co-evaluation              AM-PAC PT "6 Clicks" Mobility   Outcome Measure  Help needed turning from your back to your side while in a flat bed without using bedrails?: Total Help needed moving from lying on your back to sitting on the side of a flat bed without using bedrails?: Total Help needed moving to and from a bed to a chair (including a wheelchair)?: Total Help needed standing up from a chair using your  arms (e.g., wheelchair or bedside chair)?: Total Help needed to walk in hospital room?: Total Help needed climbing 3-5 steps with a railing? : Total 6 Click Score: 6    End of Session   Activity Tolerance: Patient limited by fatigue;Patient limited by pain Patient left: in bed;with call bell/phone within reach;with bed alarm set Nurse Communication: Mobility status;Need for lift equipment PT Visit Diagnosis: Other abnormalities of gait and mobility (R26.89);Muscle weakness (generalized) (M62.81)     Time: 4287-6811 PT Time Calculation (min) (ACUTE ONLY): 20 min  Charges:  $Therapeutic Activity: 8-22 mins                     Jillian Leblanc, PT Acute Rehabilitation Services Pager: [phone] Office: [phone]    Jillian Leblanc 12/25/2020, 1:59 PM

## 2020-12-25 NOTE — TOC Progression Note (Signed)
Transition of Care [facility]) - Progression Note    Patient Details  Name: Jillian Leblanc MRN: [id number] Date of Birth: 10-16-1945  Transition of Care [facility]) CM/SW Contact  Mearl Latin, LCSW Phone Number: 12/25/2020, 2:24 PM  Clinical Narrative:    CSW spoke with patient's daughter [name] and updated her that [facility] would require family to complete paperwork prior to admission and that she would require non-emergency transport. [name] stated her dad would need to be the one to do paperwork and she will let him know. CSW offered to contact him but she stated he likely would not answer the phone if CSW tried so she will call him. CSW will continue to follow.      Barriers to Discharge: Continued Medical Work up  Expected Discharge Plan and Services   In-house Referral: Clinical Social Work   Post Acute Care Choice: Skilled Nursing Facility Living arrangements for the past 2 months: Single Family Home                                       Social Determinants of Health (SDOH) Interventions    Readmission Risk Interventions No flowsheet data found.

## 2020-12-25 NOTE — Progress Notes (Signed)
Neurosurgery Service Progress Note  Subjective: No acute events overnight, lethargic this morning and unable to provide much history / subjective information   Objective: Vitals:   12/25/20 0337 12/25/20 0500 12/25/20 0740 12/25/20 0745  BP: 136/62  (!) 141/68   Pulse: 75  (!) 111 (!) 108  Resp: 15  14 17   Temp: 97.6 F (36.4 C)  99.4 F (37.4 C)   TempSrc: Oral  Axillary   SpO2: 96%  95% 96%  Weight:  90.2 kg    Height:        Physical Exam: Somnolent, lying in bed with nebulizer mask on, eyes open to stim but falls back asleep, PERRL, gaze conjugate, FS, Fcx4 with persistence, non-focal except bilateral dorsiflexion weakness versus poor effort  Assessment & Plan: 75 y.o. 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   -Will see if she can recover enough to consider evacuation of her epidural abscess, at this point she looks far too frail, will re-evaluate on Monday to see if she could tolerate a laminectomy and abscess drainage next week  Saturday  12/25/20 9:26 AM

## 2020-12-25 NOTE — Progress Notes (Signed)
Brief cardiology progress note:  Following up, appears plan from yesterday is for 6 weeks of antibiotics with follow up surveillance cultures. Given this, we will not plan for TEE, but if plan changes please contact cardiology and we will reassess.  Jodelle Red, MD, PhD, [facility]  [facility]  [place]  [facility] & Vascular at [facility] at [facility] [address], [address] [place], [place] [address] [phone]

## 2020-12-25 NOTE — Progress Notes (Signed)
PROGRESS NOTE    Jillian Leblanc  ENI:[id number] DOB: February 10, 1945 DOA: 12/18/2020 PCP: Karle Plumber, MD   Brief Narrative:  75 yo woman who presented with back pain after falls, developed sepsis, Ecoli from Blood cultures, now with hypotension and afib with RVR, fever.  Admitted 11/25 pm with SOB, non productive cough, subjective fever, fatigue, muscle aches, and back pain (hadn't completely resolved after prior recent discharge).   Increasing confusion and intermittent somnolence at home, poor po intake.  Admitted to ICU for septic shock requiring pressors through 12/23/2020 in the setting of E. coli bacteremia initial source concern to be UTI with septic discitis in the setting of recent L5-S1 hardware placement.  Assessment & Plan:   Septic shock secondary to E. coli bacteremia, UTI, septic discitis, POA -Patient off pressors as of 12/23/2020 -shock resolved but continues to meet sepsis criteria -Continue ceftriaxone, 6-week duration via PICC line per ID with post abx blood cultures 2 weeks later to ensure clearance of infection -Neurosurgery considering evacuation of I&D and laminectomy however patient appears to be a poor surgical candidate given her ongoing sepsis criteria weakness and mental status.   Acute metabolic encephalopathy, multifactorial, ongoing -In the setting of septic shock, respiratory failure, AKI, narcotics and potential hospital delirium -Will try to limit narcotic use as tolerated  Acute respiratory failure with hypoxia, multifactorial Concurrent asthma exacerbation versus COPD exacerbation, resolving Pulmonary edema, resolving -Continue inhaled medication, steroids per previous pulmonology recommendations -Supportive care incentive spirometry, flutter, Mucinex, Tessalon Delsym ongoing -Appears more euvolemic, hold further diuresis in the setting of minimally elevated creatinine    Acute back pain 2/2 discitis & OM, previous compression fracture -Continue  home Elavil, as needed Tylenol, lidocaine patch, out of bed as tolerated per surgical recommendations  -PT OT ongoing, likely disposition SNF once more medically stable -Continue to wean narcotics as possible, continue oxycodone IR with as needed fentanyl IV -Continue gabapentin  Dysphagia -Likely multifactorial in setting of above, follow speech recommendations   Provoked Afib with RVR , new onset, now resolved. -Likely provoked in the setting of septic shock respiratory failure  -Initially requiring amiodarone for rate control; however, with resolution of septic shock and hypoxic respiratory failure heart rate has returned back to normal sinus/   Acute thrombocytopenia, resolved Chronic anemia, likely of chronic disease -Likely secondary to sepsis/septic shock, follow repeat labs  -Resolving with treatment of above -monitor   Uncontrolled diabetes in the setting of hypergylcemia Exacerbated on steroids -A1c 8.4 October of this year -Continue increased long-acting insulin at 8 units daily, sliding scale insulin, hypoglycemic protocol  Deconditioning -needs PT & OT -currently recommending SNF  GERD -PPI daily added   CKD 2; AKI resolved Chronic mild hyponatremia -Appears to be chronic, baseline sodium in the low 130s -renally dose meds, avoid nephrotoxic meds -Hold Lasix  Allergic rhinitis -flonase ongoing -Continue claritin  DVT prophylaxis: Heparin subcu Code Status: Full Family Communication: None present  Status is: Inpatient  Dispo: The patient is from: Home              Anticipated d/c is to: SNF              Anticipated d/c date is: 48 to 72 hours              Patient currently not medically stable for discharge  Consultants:  PCCM, ID  Procedures:  None  Antimicrobials:  Cefazolin  Subjective: No acute issues or events overnight denies nausea vomiting diarrhea constipation headache  fevers chills or chest pain  Objective: Vitals:   12/25/20 0337  12/25/20 0500 12/25/20 0740 12/25/20 0745  BP: 136/62  (!) 141/68   Pulse: 75  (!) 111 (!) 108  Resp: 15  14 17   Temp: 97.6 F (36.4 C)  99.4 F (37.4 C)   TempSrc: Oral  Axillary   SpO2: 96%  95% 96%  Weight:  90.2 kg    Height:        Intake/Output Summary (Last 24 hours) at 12/25/2020 0807 Last data filed at 12/24/2020 2357 Gross per 24 hour  Intake 200 ml  Output 2250 ml  Net -2050 ml    Filed Weights   12/23/20 0500 12/24/20 0500 12/25/20 0500  Weight: 94.9 kg 91.1 kg 90.2 kg    Examination:  General:  Pleasantly resting in bed, No acute distress. HEENT:  Normocephalic atraumatic.  Sclerae nonicteric, noninjected.  Extraocular movements intact bilaterally. Neck:  Without mass or deformity.  Trachea is midline. Lungs:  Clear to auscultate bilaterally without rhonchi, wheeze, or rales. Heart:  Regular rate and rhythm.  Without murmurs, rubs, or gallops. Abdomen:  Soft, nontender, nondistended.  Without guarding or rebound. Extremities: Without cyanosis, clubbing, edema, or obvious deformity. Vascular:  Dorsalis pedis and posterior tibial pulses palpable bilaterally. Skin:  Warm and dry, no erythema, no ulcerations.   Data Reviewed: I have personally reviewed following labs and imaging studies  CBC: Recent Labs  Lab 12/19/20 0237 12/19/20 2337 12/20/20 0429 12/21/20 0134 12/22/20 0017 12/24/20 0446  WBC 4.4 2.8* 4.1 3.8* 5.9 6.5  NEUTROABS 4.0  --  3.7 2.7 5.2 5.6  HGB 8.6* 7.6* 8.1* 8.6* 9.1* 9.4*  HCT 26.5* 22.0* 24.2* 24.6* 27.0* 27.7*  MCV 91.4 85.3 85.5 84.8 86.5 84.5  PLT 57* 48* 54* 68* 120*  118* [id number]    Basic Metabolic Panel: Recent Labs  Lab 12/19/20 0237 12/19/20 2337 12/20/20 0429 12/21/20 0134 12/22/20 0017 12/23/20 0052 12/24/20 0446 12/24/20 0926 12/24/20 1349 12/24/20 1755 12/25/20 0250  NA 126* 129* 129* 127* 128* 128* 130*  --   --   --   --   K 3.8 3.4* 3.6 3.9 5.0 4.7 6.1* 4.9 4.4 5.1 4.7  CL 96* 97* 98 98 99 95* 94*  --    --   --   --   CO2 17* 21* 18* 21* 20* 22 25  --   --   --   --   GLUCOSE 99 78 85 127* 218* 224* 219*  --   --   --   --   BUN 35* 28* 25* 17 21 24* 27*  --   --   --   --   CREATININE 1.98* 1.55* 1.56* 1.07* 1.11* 1.12* 1.15*  --   --   --   --   CALCIUM 6.8* 7.3* 7.4* 7.6* 8.1* 8.8* 8.9  --   --   --   --   MG 1.5* 1.7  --   --  2.1  --   --   --   --   --   --     GFR: Estimated Creatinine Clearance: 43.6 mL/min (A) (by C-G formula based on SCr of 1.15 mg/dL (H)). Liver Function Tests: Recent Labs  Lab 12/19/20 0237 12/19/20 2337 12/20/20 0429 12/21/20 0134 12/22/20 0017  AST 39 30 29 33 25  ALT 27 23 23 22 22   ALKPHOS 172* 208* 208* 243* 257*  BILITOT 1.8* 1.5* 1.7* 1.1 0.8  PROT  5.7* 5.3* 5.5* 5.8* 6.3*  ALBUMIN 2.2* 1.8* 1.9* 1.8* 1.9*    No results for input(s): LIPASE, AMYLASE in the last 168 hours. No results for input(s): AMMONIA in the last 168 hours. Coagulation Profile: Recent Labs  Lab 12/18/20 1949 12/22/20 0017  INR 1.2 1.1    Cardiac Enzymes: No results for input(s): CKTOTAL, CKMB, CKMBINDEX, TROPONINI in the last 168 hours. BNP (last 3 results) No results for input(s): PROBNP in the last 8760 hours. HbA1C: No results for input(s): HGBA1C in the last 72 hours. CBG: Recent Labs  Lab 12/24/20 1125 12/24/20 1600 12/24/20 2036 12/24/20 2343 12/25/20 0457  GLUCAP 121* 122* 208* 170* 107*    Lipid Profile: No results for input(s): CHOL, HDL, LDLCALC, TRIG, CHOLHDL, LDLDIRECT in the last 72 hours. Thyroid Function Tests: No results for input(s): TSH, T4TOTAL, FREET4, T3FREE, THYROIDAB in the last 72 hours. Anemia Panel: No results for input(s): VITAMINB12, FOLATE, FERRITIN, TIBC, IRON, RETICCTPCT in the last 72 hours. Sepsis Labs: Recent Labs  Lab 12/18/20 1955 12/18/20 2148 12/19/20 0237 12/20/20 0429  PROCALCITON  --   --  16.24  --   LATICACIDVEN 2.1* 1.8  --  1.5     Recent Results (from the past 240 hour(s))  Blood Culture  (routine x 2)     Status: Abnormal   Collection Time: 12/18/20  7:49 PM   Specimen: BLOOD  Result Value Ref Range Status   Specimen Description BLOOD RIGHT ANTECUBITAL  Final   Special Requests   Final    BOTTLES DRAWN AEROBIC AND ANAEROBIC Blood Culture results may not be optimal due to an excessive volume of blood received in culture bottles   Culture  Setup Time   Final    GRAM NEGATIVE RODS IN BOTH AEROBIC AND ANAEROBIC BOTTLES CRITICAL RESULT CALLED TO, READ BACK BY AND VERIFIED WITH: J,MILLEN PHARMD @0958  12/19/20 EB Performed at [facility], [place] [address], [place], [place] [address]    Culture ESCHERICHIA COLI (A)  Final   Report Status 12/21/2020 FINAL  Final   Organism ID, Bacteria ESCHERICHIA COLI  Final      Susceptibility   Escherichia coli - MIC*    AMPICILLIN 8 SENSITIVE Sensitive     CEFAZOLIN <=4 SENSITIVE Sensitive     CEFEPIME <=0.12 SENSITIVE Sensitive     CEFTAZIDIME <=1 SENSITIVE Sensitive     CEFTRIAXONE <=0.25 SENSITIVE Sensitive     CIPROFLOXACIN <=0.25 SENSITIVE Sensitive     GENTAMICIN <=1 SENSITIVE Sensitive     IMIPENEM <=0.25 SENSITIVE Sensitive     TRIMETH/SULFA <=20 SENSITIVE Sensitive     AMPICILLIN/SULBACTAM <=2 SENSITIVE Sensitive     PIP/TAZO <=4 SENSITIVE Sensitive     * ESCHERICHIA COLI  Blood Culture ID Panel (Reflexed)     Status: Abnormal   Collection Time: 12/18/20  7:49 PM  Result Value Ref Range Status   Enterococcus faecalis NOT DETECTED NOT DETECTED Final   Enterococcus Faecium NOT DETECTED NOT DETECTED Final   Listeria monocytogenes NOT DETECTED NOT DETECTED Final   Staphylococcus species NOT DETECTED NOT DETECTED Final   Staphylococcus aureus (BCID) NOT DETECTED NOT DETECTED Final   Staphylococcus epidermidis NOT DETECTED NOT DETECTED Final   Staphylococcus lugdunensis NOT DETECTED NOT DETECTED Final   Streptococcus species NOT DETECTED NOT DETECTED Final   Streptococcus agalactiae NOT DETECTED NOT DETECTED Final    Streptococcus pneumoniae NOT DETECTED NOT DETECTED Final   Streptococcus pyogenes NOT DETECTED NOT DETECTED Final   A.calcoaceticus-baumannii NOT DETECTED  NOT DETECTED Final   Bacteroides fragilis NOT DETECTED NOT DETECTED Final   Enterobacterales DETECTED (A) NOT DETECTED Final    Comment: Enterobacterales represent a large order of gram negative bacteria, not a single organism. CRITICAL RESULT CALLED TO, READ BACK BY AND VERIFIED WITH: J,MILLEN PHARMD @0958  12/19/20 EB    Enterobacter cloacae complex NOT DETECTED NOT DETECTED Final   Escherichia coli DETECTED (A) NOT DETECTED Final    Comment: CRITICAL RESULT CALLED TO, READ BACK BY AND VERIFIED WITH: J,MILLEN PHARMD @0958  12/19/20 EB    Klebsiella aerogenes NOT DETECTED NOT DETECTED Final   Klebsiella oxytoca NOT DETECTED NOT DETECTED Final   Klebsiella pneumoniae NOT DETECTED NOT DETECTED Final   Proteus species NOT DETECTED NOT DETECTED Final   Salmonella species NOT DETECTED NOT DETECTED Final   Serratia marcescens NOT DETECTED NOT DETECTED Final   Haemophilus influenzae NOT DETECTED NOT DETECTED Final   Neisseria meningitidis NOT DETECTED NOT DETECTED Final   Pseudomonas aeruginosa NOT DETECTED NOT DETECTED Final   Stenotrophomonas maltophilia NOT DETECTED NOT DETECTED Final   Candida albicans NOT DETECTED NOT DETECTED Final   Candida auris NOT DETECTED NOT DETECTED Final   Candida glabrata NOT DETECTED NOT DETECTED Final   Candida krusei NOT DETECTED NOT DETECTED Final   Candida parapsilosis NOT DETECTED NOT DETECTED Final   Candida tropicalis NOT DETECTED NOT DETECTED Final   Cryptococcus neoformans/gattii NOT DETECTED NOT DETECTED Final   CTX-M ESBL NOT DETECTED NOT DETECTED Final   Carbapenem resistance IMP NOT DETECTED NOT DETECTED Final   Carbapenem resistance KPC NOT DETECTED NOT DETECTED Final   Carbapenem resistance NDM NOT DETECTED NOT DETECTED Final   Carbapenem resist OXA 48 LIKE NOT DETECTED NOT DETECTED Final    Carbapenem resistance VIM NOT DETECTED NOT DETECTED Final    Comment: Performed at [facility], [address] [place], [place] [address]  Blood Culture (routine x 2)     Status: Abnormal   Collection Time: 12/18/20  8:10 PM   Specimen: BLOOD  Result Value Ref Range Status   Specimen Description BLOOD SITE NOT SPECIFIED  Final   Special Requests AEROBIC BOTTLE ONLY Blood Culture adequate volume  Final   Culture  Setup Time   Final    GRAM NEGATIVE RODS AEROBIC BOTTLE ONLY CRITICAL VALUE NOTED.  VALUE IS CONSISTENT WITH PREVIOUSLY REPORTED AND CALLED VALUE.    Culture (A)  Final    ESCHERICHIA COLI SUSCEPTIBILITIES PERFORMED ON PREVIOUS CULTURE WITHIN THE LAST 5 DAYS. Performed at [facility], [place] [address] [place], [place] [address]    Report Status 12/21/2020 FINAL  Final  Resp Panel by RT-PCR (Flu A&B, Covid) Nasopharyngeal Swab     Status: None   Collection Time: 12/18/20  9:10 PM   Specimen: Nasopharyngeal Swab; Nasopharyngeal(NP) swabs in vial transport medium  Result Value Ref Range Status   SARS Coronavirus 2 by RT PCR NEGATIVE NEGATIVE Final    Comment: (NOTE) SARS-CoV-2 target nucleic acids are NOT DETECTED.  The SARS-CoV-2 RNA is generally detectable in upper respiratory specimens during the acute phase of infection. The lowest concentration of SARS-CoV-2 viral copies this assay can detect is 138 copies/mL. A negative result does not preclude SARS-Cov-2 infection and should not be used as the sole basis for treatment or other patient management decisions. A negative result may occur with  improper specimen collection/handling, submission of specimen other than nasopharyngeal swab, presence of viral mutation(s) within the areas targeted by this assay, and  inadequate number of viral copies(<138 copies/mL). A negative result must be combined with clinical observations, patient history, and epidemiological information. The expected result is  Negative.  Fact Sheet for Patients:  EntrepreneurPulse.com.au  Fact Sheet for Healthcare Providers:  IncredibleEmployment.be  This test is no t yet approved or cleared by the [place] FDA and  has been authorized for detection and/or diagnosis of SARS-CoV-2 by FDA under an Emergency Use Authorization (EUA). This EUA will remain  in effect (meaning this test can be used) for the duration of the COVID-19 declaration under Section 564(b)(1) of the Act, 21 U.S.C.section 360bbb-3(b)(1), unless the authorization is terminated  or revoked sooner.       Influenza A by PCR NEGATIVE NEGATIVE Final   Influenza B by PCR NEGATIVE NEGATIVE Final    Comment: (NOTE) The Xpert Xpress SARS-CoV-2/FLU/RSV plus assay is intended as an aid in the diagnosis of influenza from Nasopharyngeal swab specimens and should not be used as a sole basis for treatment. Nasal washings and aspirates are unacceptable for Xpert Xpress SARS-CoV-2/FLU/RSV testing.  Fact Sheet for Patients: EntrepreneurPulse.com.au  Fact Sheet for Healthcare Providers: IncredibleEmployment.be  This test is not yet approved or cleared by the [place] FDA and has been authorized for detection and/or diagnosis of SARS-CoV-2 by FDA under an Emergency Use Authorization (EUA). This EUA will remain in effect (meaning this test can be used) for the duration of the COVID-19 declaration under Section 564(b)(1) of the Act, 21 U.S.C. section 360bbb-3(b)(1), unless the authorization is terminated or revoked.  Performed at [facility], [place] [address] [place], [place] [address]   MRSA Next Gen by PCR, Nasal     Status: Abnormal   Collection Time: 12/19/20 12:27 AM   Specimen: Nasal Mucosa; Nasal Swab  Result Value Ref Range Status   MRSA by PCR Next Gen DETECTED (A) NOT DETECTED Final    Comment: RESULT CALLED TO, READ BACK BY AND VERIFIED WITH: C  MAS,RN@0238  12/19/20 [place] (NOTE) The GeneXpert MRSA Assay (FDA approved for NASAL specimens only), is one component of a comprehensive MRSA colonization surveillance program. It is not intended to diagnose MRSA infection nor to guide or monitor treatment for MRSA infections. Test performance is not FDA approved in patients less than 1 years old. Performed at [facility], [place] [address] [place], [place] [address]   Urine Culture     Status: Abnormal   Collection Time: 12/19/20 12:38 AM   Specimen: In/Out Cath Urine  Result Value Ref Range Status   Specimen Description IN/OUT CATH URINE  Final   Special Requests   Final    NONE Performed at [facility], [place] [address] [place], [place] [address]    Culture 1,000 COLONIES/mL ESCHERICHIA COLI (A)  Final   Report Status 12/21/2020 FINAL  Final   Organism ID, Bacteria ESCHERICHIA COLI (A)  Final      Susceptibility   Escherichia coli - MIC*    AMPICILLIN 8 SENSITIVE Sensitive     CEFAZOLIN <=4 SENSITIVE Sensitive     CEFEPIME <=0.12 SENSITIVE Sensitive     CEFTRIAXONE <=0.25 SENSITIVE Sensitive     CIPROFLOXACIN <=0.25 SENSITIVE Sensitive     GENTAMICIN <=1 SENSITIVE Sensitive     IMIPENEM <=0.25 SENSITIVE Sensitive     NITROFURANTOIN <=16 SENSITIVE Sensitive     TRIMETH/SULFA <=20 SENSITIVE Sensitive     AMPICILLIN/SULBACTAM 4 SENSITIVE Sensitive     PIP/TAZO <=4 SENSITIVE Sensitive     *  1,000 COLONIES/mL ESCHERICHIA COLI  Respiratory (~20 pathogens) panel by PCR     Status: None   Collection Time: 12/19/20  9:07 AM   Specimen: Respiratory  Result Value Ref Range Status   Adenovirus NOT DETECTED NOT DETECTED Final   Coronavirus 229E NOT DETECTED NOT DETECTED Final    Comment: (NOTE) The Coronavirus on the Respiratory Panel, DOES NOT test for the novel  Coronavirus (2019 nCoV)    Coronavirus HKU1 NOT DETECTED NOT DETECTED Final   Coronavirus NL63 NOT DETECTED NOT DETECTED Final   Coronavirus OC43 NOT DETECTED  NOT DETECTED Final   Metapneumovirus NOT DETECTED NOT DETECTED Final   Rhinovirus / Enterovirus NOT DETECTED NOT DETECTED Final   Influenza A NOT DETECTED NOT DETECTED Final   Influenza B NOT DETECTED NOT DETECTED Final   Parainfluenza Virus 1 NOT DETECTED NOT DETECTED Final   Parainfluenza Virus 2 NOT DETECTED NOT DETECTED Final   Parainfluenza Virus 3 NOT DETECTED NOT DETECTED Final   Parainfluenza Virus 4 NOT DETECTED NOT DETECTED Final   Respiratory Syncytial Virus NOT DETECTED NOT DETECTED Final   Bordetella pertussis NOT DETECTED NOT DETECTED Final   Bordetella Parapertussis NOT DETECTED NOT DETECTED Final   Chlamydophila pneumoniae NOT DETECTED NOT DETECTED Final   Mycoplasma pneumoniae NOT DETECTED NOT DETECTED Final    Comment: Performed at [facility], [address] [place], [place] [address]  Culture, blood (Routine X 2) w Reflex to ID Panel     Status: None   Collection Time: 12/20/20  2:28 PM   Specimen: BLOOD LEFT HAND  Result Value Ref Range Status   Specimen Description BLOOD LEFT HAND  Final   Special Requests   Final    BOTTLES DRAWN AEROBIC ONLY Blood Culture results may not be optimal due to an inadequate volume of blood received in culture bottles   Culture   Final    NO GROWTH 5 DAYS Performed at [facility], [address] [place], [place] [address]    Report Status 12/25/2020 FINAL  Final  Culture, blood (Routine X 2) w Reflex to ID Panel     Status: None   Collection Time: 12/20/20  2:31 PM   Specimen: BLOOD RIGHT HAND  Result Value Ref Range Status   Specimen Description BLOOD RIGHT HAND  Final   Special Requests   Final    BOTTLES DRAWN AEROBIC AND ANAEROBIC Blood Culture adequate volume   Culture   Final    NO GROWTH 5 DAYS Performed at [facility], [address] [place], [place] [address]    Report Status 12/25/2020 FINAL  Final          Radiology Studies: No results found.      Scheduled Meds:  acetaminophen   650 mg Oral Once   acetaminophen  650 mg Oral Q6H   amitriptyline  100 mg Oral QHS   arformoterol  15 mcg Nebulization BID   calcium carbonate  800 mg of elemental calcium Oral BID   calcium-vitamin D  0.5 tablet Oral BID   Chlorhexidine Gluconate Cloth  6 each Topical Daily   fentaNYL (SUBLIMAZE) injection  100 mcg Intravenous Once   fluticasone  1 spray Each Nare Daily   gabapentin  300 mg Oral BID   heparin injection (subcutaneous)  5,000 Units Subcutaneous Q8H   insulin aspart  0-15 Units Subcutaneous Q4H   insulin detemir  8 Units Subcutaneous Daily   levothyroxine  150 mcg Oral  Q0600   lidocaine  1 patch Transdermal Daily   loratadine  10 mg Oral Daily   mouth rinse  15 mL Mouth Rinse BID   methylPREDNISolone (SOLU-MEDROL) injection  40 mg Intravenous Q24H   pantoprazole  40 mg Oral Daily   pramipexole  1.5 mg Oral TID   revefenacin  175 mcg Nebulization Daily   sodium polystyrene  15 g Oral Once   Continuous Infusions:  sodium chloride Stopped (12/21/20 1240)   sodium chloride      ceFAZolin (ANCEF) IV 2 g (12/25/20 0155)     LOS: 7 days   Time spent:  Azucena Fallen, DO [facility]  If 7PM-7AM, please contact night-coverage [email or web address]  12/25/2020, 8:07 AM

## 2020-12-25 NOTE — Progress Notes (Signed)
At this time, ive been notified by phlebotomy lead, Rosine Beat, that the stat labs ordered at 1430 cant be drawn d/t the fact that [facility] rule is not to stick pt more than 3 times in a 24hr.  Will page iv team at this time.

## 2020-12-26 DIAGNOSIS — E871 Hypo-osmolality and hyponatremia: Secondary | ICD-10-CM | POA: Diagnosis not present

## 2020-12-26 DIAGNOSIS — R7881 Bacteremia: Secondary | ICD-10-CM | POA: Diagnosis not present

## 2020-12-26 DIAGNOSIS — A419 Sepsis, unspecified organism: Secondary | ICD-10-CM | POA: Diagnosis not present

## 2020-12-26 DIAGNOSIS — G9341 Metabolic encephalopathy: Secondary | ICD-10-CM | POA: Diagnosis not present

## 2020-12-26 LAB — GLUCOSE, CAPILLARY
Glucose-Capillary: 135 mg/dL — ABNORMAL HIGH (ref 70–99)
Glucose-Capillary: 164 mg/dL — ABNORMAL HIGH (ref 70–99)
Glucose-Capillary: 171 mg/dL — ABNORMAL HIGH (ref 70–99)
Glucose-Capillary: 187 mg/dL — ABNORMAL HIGH (ref 70–99)
Glucose-Capillary: 265 mg/dL — ABNORMAL HIGH (ref 70–99)
Glucose-Capillary: 279 mg/dL — ABNORMAL HIGH (ref 70–99)
Glucose-Capillary: 41 mg/dL — CL (ref 70–99)
Glucose-Capillary: 57 mg/dL — ABNORMAL LOW (ref 70–99)

## 2020-12-26 LAB — POTASSIUM: Potassium: 4.2 mmol/L (ref 3.5–5.1)

## 2020-12-26 MED ORDER — SODIUM CHLORIDE 0.9% FLUSH
10.0000 mL | INTRAVENOUS | Status: DC | PRN
  Administered 2020-12-29 – 2021-01-07 (×3): 10 mL

## 2020-12-26 MED ORDER — DEXTROSE 50 % IV SOLN
INTRAVENOUS | Status: AC
  Administered 2020-12-26: 25 mL
  Filled 2020-12-26: qty 50

## 2020-12-26 MED ORDER — SODIUM CHLORIDE 0.9% FLUSH
10.0000 mL | Freq: Two times a day (BID) | INTRAVENOUS | Status: DC
Start: 2020-12-26 — End: 2021-01-09
  Administered 2020-12-26 – 2021-01-08 (×24): 10 mL

## 2020-12-26 NOTE — Progress Notes (Signed)
Hypoglycemic Event  CBG: 57  Treatment: 8 oz juice/soda  Symptoms: None  Follow-up CBG: Time:41 CBG Result:0925  Possible Reasons for Event: Inadequate meal intake  Comments/MD notified:Dr Lancaster notified  Hypoglycemic Event  CBG: 41  Treatment: D50 25 mL (12.5 gm)  Symptoms: None  Follow-up CBG: Time:135 CBG Result:0956  Possible Reasons for Event: Inadequate meal intake  Comments/MD notified:Dr Natale Milch notified will hold Levemir per MD     Jillian Leblanc

## 2020-12-26 NOTE — Progress Notes (Signed)
  NEUROSURGERY PROGRESS NOTE   No issues overnight. Pt has back pain, denies any worsening weakness in BLE.  EXAM:  BP 126/60 (BP Location: Right Arm)   Pulse 63   Temp 98.1 F (36.7 C) (Oral)   Resp 14   Ht 5' (1.524 m)   Wt 90.2 kg   SpO2 99%   BMI 38.84 kg/m   Awake, alert Speech fluent CN grossly intact  5/5 BUE Good proximal strength BLE Good PF/DF bilaterally   IMPRESSION:  75 y.o. female with likely lumbar SEA on CT, can't get MRI. LE motor exam is stable  PLAN: - Cont current mgmt - With stable exam, does not require emergent operative decompression at this time. Cont to monitor neurologic exam, Dr. Maurice Small will consider surgery early next week.   Lisbeth Renshaw, MD [facility]

## 2020-12-26 NOTE — Progress Notes (Signed)
PROGRESS NOTE    Jillian Leblanc  JME:[id number] DOB: [birth year] DOA: 12/18/2020 PCP: Karle Plumber, MD   Brief Narrative:  75 yo woman who presented with back pain after falls, developed sepsis, Ecoli from Blood cultures, now with hypotension and afib with RVR, fever.  Admitted 11/25 pm with SOB, non productive cough, subjective fever, fatigue, muscle aches, and back pain (hadn't completely resolved after prior recent discharge).   Increasing confusion and intermittent somnolence at home, poor po intake.  Admitted to ICU for septic shock requiring pressors through 12/23/2020 in the setting of E. coli bacteremia initial source concern to be UTI with septic discitis in the setting of recent L5-S1 hardware placement.  Assessment & Plan:   Septic shock secondary to E. coli bacteremia, UTI, septic discitis, POA -Patient off pressors as of 12/23/2020 -shock resolved but continues to meet sepsis criteria -Continue ceftriaxone, 6-week duration via PICC line per ID with post abx blood cultures 2 weeks later to ensure clearance of infection -Neurosurgery considering evacuation of I&D and laminectomy as early as next week; however, patient appears to be a poor surgical candidate given her ongoing sepsis criteria weakness and mental status.   Acute metabolic encephalopathy, multifactorial, ongoing -In the setting of septic shock, respiratory failure, AKI, narcotics and potential hospital delirium -Mild hypoglycemia this morning likely contributing to her mental status but resolved with treatment -Will try to limit narcotic use as tolerated given ongoing back pain  Acute respiratory failure with hypoxia, multifactorial Concurrent asthma exacerbation versus COPD exacerbation, resolving Pulmonary edema, resolving -Continue inhaled medication, steroids per previous pulmonology recommendations -Supportive care incentive spirometry, flutter, Mucinex, Tessalon Delsym ongoing -Appears more euvolemic,  hold further diuresis in the setting of minimally elevated creatinine    Acute back pain 2/2 discitis & OM, previous compression fracture -Continue home Elavil, as needed Tylenol, lidocaine patch, out of bed as tolerated per surgical recommendations  -PT OT ongoing, likely disposition SNF once more medically stable -Continue to wean narcotics as possible, continue oxycodone IR with as needed fentanyl IV -Continue gabapentin  Dysphagia -Likely multifactorial in setting of above, follow speech recommendations   Provoked Afib with RVR , new onset, now resolved. -Likely provoked in the setting of septic shock respiratory failure  -Initially requiring amiodarone for rate control; however, with resolution of septic shock and hypoxic respiratory failure heart rate has returned back to normal sinus/   Acute thrombocytopenia, resolved Chronic anemia, likely of chronic disease -Likely secondary to sepsis/septic shock, follow repeat labs  -Resolving with treatment of above -monitor   Uncontrolled diabetes in the setting of hypergylcemia Exacerbated on steroids -A1c 8.4 October of this year -Continue increased long-acting insulin at 8 units daily, sliding scale insulin, hypoglycemic protocol -monitor for hypoglycemia given glucose 41 this morning, holding long-acting insulin today given poor p.o. intake over the past 24 hours  Deconditioning -needs PT & OT -currently recommending SNF  GERD -PPI daily added   CKD 2; AKI resolved Chronic mild hyponatremia -Appears to be chronic, baseline sodium in the low 130s -renally dose meds, avoid nephrotoxic meds -Hold Lasix  Allergic rhinitis -flonase ongoing -Continue claritin  DVT prophylaxis: Heparin subcu Code Status: Full Family Communication: None present  Status is: Inpatient  Dispo: The patient is from: Home              Anticipated d/c is to: SNF              Anticipated d/c date is: 48 to 72 hours  Patient currently  not medically stable for discharge  Consultants:  PCCM, ID  Procedures:  None  Antimicrobials:  Cefazolin  Subjective: No acute issues or events overnight, patient much more awake alert and oriented this morning, continues to complain of ongoing back pain but denies nausea vomiting diarrhea constipation headache fevers chills or chest pain  Objective: Vitals:   12/25/20 2037 12/26/20 0022 12/26/20 0446 12/26/20 0553  BP: (!) 111/57 (!) 123/51 126/60   Pulse:      Resp: 20 14 14    Temp:  99.1 F (37.3 C) 98.1 F (36.7 C)   TempSrc:  Oral Oral   SpO2:    99%  Weight:      Height:        Intake/Output Summary (Last 24 hours) at 12/26/2020 0810 Last data filed at 12/26/2020 0447 Gross per 24 hour  Intake 320 ml  Output 1325 ml  Net -1005 ml    Filed Weights   12/23/20 0500 12/24/20 0500 12/25/20 0500  Weight: 94.9 kg 91.1 kg 90.2 kg    Examination:  General:  Pleasantly resting in bed, No acute distress. HEENT:  Normocephalic atraumatic.  Sclerae nonicteric, noninjected.  Extraocular movements intact bilaterally. Neck:  Without mass or deformity.  Trachea is midline. Lungs:  Clear to auscultate bilaterally without rhonchi, wheeze, or rales. Heart:  Regular rate and rhythm.  Without murmurs, rubs, or gallops. Abdomen:  Soft, nontender, nondistended.  Without guarding or rebound. Extremities: Without cyanosis, clubbing, edema, or obvious deformity. Vascular:  Dorsalis pedis and posterior tibial pulses palpable bilaterally. Skin:  Warm and dry, no erythema   Data Reviewed: I have personally reviewed following labs and imaging studies  CBC: Recent Labs  Lab 12/20/20 0429 12/21/20 0134 12/22/20 0017 12/24/20 0446 12/25/20 1836  WBC 4.1 3.8* 5.9 6.5 8.9  NEUTROABS 3.7 2.7 5.2 5.6 8.2*  HGB 8.1* 8.6* 9.1* 9.4* 9.0*  HCT 24.2* 24.6* 27.0* 27.7* 26.8*  MCV 85.5 84.8 86.5 84.5 85.9  PLT 54* 68* 120*  118* 221 [id number]    Basic Metabolic Panel: Recent Labs  Lab  12/19/20 2337 12/20/20 0429 12/21/20 0134 12/22/20 0017 12/23/20 0052 12/24/20 0446 12/24/20 0926 12/24/20 1349 12/24/20 1755 12/25/20 0250 12/25/20 1836 12/26/20 0600  NA 129*   < > 127* 128* 128* 130*  --   --   --   --  132*  --   K 3.4*   < > 3.9 5.0 4.7 6.1*   < > 4.4 5.1 4.7 4.5 4.2  CL 97*   < > 98 99 95* 94*  --   --   --   --  97*  --   CO2 21*   < > 21* 20* 22 25  --   --   --   --  31  --   GLUCOSE 78   < > 127* 218* 224* 219*  --   --   --   --  192*  --   BUN 28*   < > 17 21 24* 27*  --   --   --   --  21  --   CREATININE 1.55*   < > 1.07* 1.11* 1.12* 1.15*  --   --   --   --  0.89  --   CALCIUM 7.3*   < > 7.6* 8.1* 8.8* 8.9  --   --   --   --  8.5*  --   MG 1.7  --   --  2.1  --   --   --   --   --   --   --   --    < > = values in this interval not displayed.    GFR: Estimated Creatinine Clearance: 56.3 mL/min (by C-G formula based on SCr of 0.89 mg/dL). Liver Function Tests: Recent Labs  Lab 12/19/20 2337 12/20/20 0429 12/21/20 0134 12/22/20 0017  AST 30 29 33 25  ALT 23 23 22 22   ALKPHOS 208* 208* 243* 257*  BILITOT 1.5* 1.7* 1.1 0.8  PROT 5.3* 5.5* 5.8* 6.3*  ALBUMIN 1.8* 1.9* 1.8* 1.9*    No results for input(s): LIPASE, AMYLASE in the last 168 hours. No results for input(s): AMMONIA in the last 168 hours. Coagulation Profile: Recent Labs  Lab 12/22/20 0017  INR 1.1    Cardiac Enzymes: No results for input(s): CKTOTAL, CKMB, CKMBINDEX, TROPONINI in the last 168 hours. BNP (last 3 results) No results for input(s): PROBNP in the last 8760 hours. HbA1C: No results for input(s): HGBA1C in the last 72 hours. CBG: Recent Labs  Lab 12/25/20 1121 12/25/20 1516 12/25/20 2015 12/26/20 0017 12/26/20 0502  GLUCAP 204* 139* 177* 164* 171*    Lipid Profile: No results for input(s): CHOL, HDL, LDLCALC, TRIG, CHOLHDL, LDLDIRECT in the last 72 hours. Thyroid Function Tests: No results for input(s): TSH, T4TOTAL, FREET4, T3FREE, THYROIDAB in the  last 72 hours. Anemia Panel: No results for input(s): VITAMINB12, FOLATE, FERRITIN, TIBC, IRON, RETICCTPCT in the last 72 hours. Sepsis Labs: Recent Labs  Lab 12/20/20 0429  LATICACIDVEN 1.5     Recent Results (from the past 240 hour(s))  Blood Culture (routine x 2)     Status: Abnormal   Collection Time: 12/18/20  7:49 PM   Specimen: BLOOD  Result Value Ref Range Status   Specimen Description BLOOD RIGHT ANTECUBITAL  Final   Special Requests   Final    BOTTLES DRAWN AEROBIC AND ANAEROBIC Blood Culture results may not be optimal due to an excessive volume of blood received in culture bottles   Culture  Setup Time   Final    GRAM NEGATIVE RODS IN BOTH AEROBIC AND ANAEROBIC BOTTLES CRITICAL RESULT CALLED TO, READ BACK BY AND VERIFIED WITH: J,MILLEN PHARMD @0958  12/19/20 EB Performed at [facility], [place] [address], [place], [place] [address]    Culture ESCHERICHIA COLI (A)  Final   Report Status 12/21/2020 FINAL  Final   Organism ID, Bacteria ESCHERICHIA COLI  Final      Susceptibility   Escherichia coli - MIC*    AMPICILLIN 8 SENSITIVE Sensitive     CEFAZOLIN <=4 SENSITIVE Sensitive     CEFEPIME <=0.12 SENSITIVE Sensitive     CEFTAZIDIME <=1 SENSITIVE Sensitive     CEFTRIAXONE <=0.25 SENSITIVE Sensitive     CIPROFLOXACIN <=0.25 SENSITIVE Sensitive     GENTAMICIN <=1 SENSITIVE Sensitive     IMIPENEM <=0.25 SENSITIVE Sensitive     TRIMETH/SULFA <=20 SENSITIVE Sensitive     AMPICILLIN/SULBACTAM <=2 SENSITIVE Sensitive     PIP/TAZO <=4 SENSITIVE Sensitive     * ESCHERICHIA COLI  Blood Culture ID Panel (Reflexed)     Status: Abnormal   Collection Time: 12/18/20  7:49 PM  Result Value Ref Range Status   Enterococcus faecalis NOT DETECTED NOT DETECTED Final   Enterococcus Faecium NOT DETECTED NOT DETECTED Final   Listeria monocytogenes NOT DETECTED NOT DETECTED Final   Staphylococcus species NOT DETECTED NOT DETECTED Final   Staphylococcus  aureus (BCID) NOT DETECTED  NOT DETECTED Final   Staphylococcus epidermidis NOT DETECTED NOT DETECTED Final   Staphylococcus lugdunensis NOT DETECTED NOT DETECTED Final   Streptococcus species NOT DETECTED NOT DETECTED Final   Streptococcus agalactiae NOT DETECTED NOT DETECTED Final   Streptococcus pneumoniae NOT DETECTED NOT DETECTED Final   Streptococcus pyogenes NOT DETECTED NOT DETECTED Final   A.calcoaceticus-baumannii NOT DETECTED NOT DETECTED Final   Bacteroides fragilis NOT DETECTED NOT DETECTED Final   Enterobacterales DETECTED (A) NOT DETECTED Final    Comment: Enterobacterales represent a large order of gram negative bacteria, not a single organism. CRITICAL RESULT CALLED TO, READ BACK BY AND VERIFIED WITH: J,MILLEN PHARMD @0958  12/19/20 EB    Enterobacter cloacae complex NOT DETECTED NOT DETECTED Final   Escherichia coli DETECTED (A) NOT DETECTED Final    Comment: CRITICAL RESULT CALLED TO, READ BACK BY AND VERIFIED WITH: J,MILLEN PHARMD @0958  12/19/20 EB    Klebsiella aerogenes NOT DETECTED NOT DETECTED Final   Klebsiella oxytoca NOT DETECTED NOT DETECTED Final   Klebsiella pneumoniae NOT DETECTED NOT DETECTED Final   Proteus species NOT DETECTED NOT DETECTED Final   Salmonella species NOT DETECTED NOT DETECTED Final   Serratia marcescens NOT DETECTED NOT DETECTED Final   Haemophilus influenzae NOT DETECTED NOT DETECTED Final   Neisseria meningitidis NOT DETECTED NOT DETECTED Final   Pseudomonas aeruginosa NOT DETECTED NOT DETECTED Final   Stenotrophomonas maltophilia NOT DETECTED NOT DETECTED Final   Candida albicans NOT DETECTED NOT DETECTED Final   Candida auris NOT DETECTED NOT DETECTED Final   Candida glabrata NOT DETECTED NOT DETECTED Final   Candida krusei NOT DETECTED NOT DETECTED Final   Candida parapsilosis NOT DETECTED NOT DETECTED Final   Candida tropicalis NOT DETECTED NOT DETECTED Final   Cryptococcus neoformans/gattii NOT DETECTED NOT DETECTED Final   CTX-M ESBL NOT DETECTED NOT  DETECTED Final   Carbapenem resistance IMP NOT DETECTED NOT DETECTED Final   Carbapenem resistance KPC NOT DETECTED NOT DETECTED Final   Carbapenem resistance NDM NOT DETECTED NOT DETECTED Final   Carbapenem resist OXA 48 LIKE NOT DETECTED NOT DETECTED Final   Carbapenem resistance VIM NOT DETECTED NOT DETECTED Final    Comment: Performed at [facility], [address] [place], [place] [address]  Blood Culture (routine x 2)     Status: Abnormal   Collection Time: 12/18/20  8:10 PM   Specimen: BLOOD  Result Value Ref Range Status   Specimen Description BLOOD SITE NOT SPECIFIED  Final   Special Requests AEROBIC BOTTLE ONLY Blood Culture adequate volume  Final   Culture  Setup Time   Final    GRAM NEGATIVE RODS AEROBIC BOTTLE ONLY CRITICAL VALUE NOTED.  VALUE IS CONSISTENT WITH PREVIOUSLY REPORTED AND CALLED VALUE.    Culture (A)  Final    ESCHERICHIA COLI SUSCEPTIBILITIES PERFORMED ON PREVIOUS CULTURE WITHIN THE LAST 5 DAYS. Performed at [facility], [place] [address], [place], [place] [address]    Report Status 12/21/2020 FINAL  Final  Resp Panel by RT-PCR (Flu A&B, Covid) Nasopharyngeal Swab     Status: None   Collection Time: 12/18/20  9:10 PM   Specimen: Nasopharyngeal Swab; Nasopharyngeal(NP) swabs in vial transport medium  Result Value Ref Range Status   SARS Coronavirus 2 by RT PCR NEGATIVE NEGATIVE Final    Comment: (NOTE) SARS-CoV-2 target nucleic acids are NOT DETECTED.  The SARS-CoV-2 RNA is generally detectable in upper respiratory specimens during the acute phase of infection.  The lowest concentration of SARS-CoV-2 viral copies this assay can detect is 138 copies/mL. A negative result does not preclude SARS-Cov-2 infection and should not be used as the sole basis for treatment or other patient management decisions. A negative result may occur with  improper specimen collection/handling, submission of specimen other than nasopharyngeal swab, presence of  viral mutation(s) within the areas targeted by this assay, and inadequate number of viral copies(<138 copies/mL). A negative result must be combined with clinical observations, patient history, and epidemiological information. The expected result is Negative.  Fact Sheet for Patients:  EntrepreneurPulse.com.au  Fact Sheet for Healthcare Providers:  IncredibleEmployment.be  This test is no t yet approved or cleared by the [place] FDA and  has been authorized for detection and/or diagnosis of SARS-CoV-2 by FDA under an Emergency Use Authorization (EUA). This EUA will remain  in effect (meaning this test can be used) for the duration of the COVID-19 declaration under Section 564(b)(1) of the Act, 21 U.S.C.section 360bbb-3(b)(1), unless the authorization is terminated  or revoked sooner.       Influenza A by PCR NEGATIVE NEGATIVE Final   Influenza B by PCR NEGATIVE NEGATIVE Final    Comment: (NOTE) The Xpert Xpress SARS-CoV-2/FLU/RSV plus assay is intended as an aid in the diagnosis of influenza from Nasopharyngeal swab specimens and should not be used as a sole basis for treatment. Nasal washings and aspirates are unacceptable for Xpert Xpress SARS-CoV-2/FLU/RSV testing.  Fact Sheet for Patients: EntrepreneurPulse.com.au  Fact Sheet for Healthcare Providers: IncredibleEmployment.be  This test is not yet approved or cleared by the [place] FDA and has been authorized for detection and/or diagnosis of SARS-CoV-2 by FDA under an Emergency Use Authorization (EUA). This EUA will remain in effect (meaning this test can be used) for the duration of the COVID-19 declaration under Section 564(b)(1) of the Act, 21 U.S.C. section 360bbb-3(b)(1), unless the authorization is terminated or revoked.  Performed at [facility], [place] [address], [place], [place] [address]   MRSA Next Gen by PCR, Nasal      Status: Abnormal   Collection Time: 12/19/20 12:27 AM   Specimen: Nasal Mucosa; Nasal Swab  Result Value Ref Range Status   MRSA by PCR Next Gen DETECTED (A) NOT DETECTED Final    Comment: RESULT CALLED TO, READ BACK BY AND VERIFIED WITH: C MAS,RN@[id number]  12/19/20 [place] (NOTE) The GeneXpert MRSA Assay (FDA approved for NASAL specimens only), is one component of a comprehensive MRSA colonization surveillance program. It is not intended to diagnose MRSA infection nor to guide or monitor treatment for MRSA infections. Test performance is not FDA approved in patients less than 55 years old. Performed at [facility], [place] [address], [place], [place] [address]   Urine Culture     Status: Abnormal   Collection Time: 12/19/20 12:38 AM   Specimen: In/Out Cath Urine  Result Value Ref Range Status   Specimen Description IN/OUT CATH URINE  Final   Special Requests   Final    NONE Performed at [facility], [place] [address], [place], [place] [address]    Culture 1,000 COLONIES/mL ESCHERICHIA COLI (A)  Final   Report Status 12/21/2020 FINAL  Final   Organism ID, Bacteria ESCHERICHIA COLI (A)  Final      Susceptibility   Escherichia coli - MIC*    AMPICILLIN 8 SENSITIVE Sensitive     CEFAZOLIN <=4 SENSITIVE Sensitive     CEFEPIME <=0.12 SENSITIVE Sensitive  CEFTRIAXONE <=0.25 SENSITIVE Sensitive     CIPROFLOXACIN <=0.25 SENSITIVE Sensitive     GENTAMICIN <=1 SENSITIVE Sensitive     IMIPENEM <=0.25 SENSITIVE Sensitive     NITROFURANTOIN <=16 SENSITIVE Sensitive     TRIMETH/SULFA <=20 SENSITIVE Sensitive     AMPICILLIN/SULBACTAM 4 SENSITIVE Sensitive     PIP/TAZO <=4 SENSITIVE Sensitive     * 1,000 COLONIES/mL ESCHERICHIA COLI  Respiratory (~20 pathogens) panel by PCR     Status: None   Collection Time: 12/19/20  9:07 AM   Specimen: Respiratory  Result Value Ref Range Status   Adenovirus NOT DETECTED NOT DETECTED Final   Coronavirus 229E NOT DETECTED NOT DETECTED Final     Comment: (NOTE) The Coronavirus on the Respiratory Panel, DOES NOT test for the novel  Coronavirus (2019 nCoV)    Coronavirus HKU1 NOT DETECTED NOT DETECTED Final   Coronavirus NL63 NOT DETECTED NOT DETECTED Final   Coronavirus OC43 NOT DETECTED NOT DETECTED Final   Metapneumovirus NOT DETECTED NOT DETECTED Final   Rhinovirus / Enterovirus NOT DETECTED NOT DETECTED Final   Influenza A NOT DETECTED NOT DETECTED Final   Influenza B NOT DETECTED NOT DETECTED Final   Parainfluenza Virus 1 NOT DETECTED NOT DETECTED Final   Parainfluenza Virus 2 NOT DETECTED NOT DETECTED Final   Parainfluenza Virus 3 NOT DETECTED NOT DETECTED Final   Parainfluenza Virus 4 NOT DETECTED NOT DETECTED Final   Respiratory Syncytial Virus NOT DETECTED NOT DETECTED Final   Bordetella pertussis NOT DETECTED NOT DETECTED Final   Bordetella Parapertussis NOT DETECTED NOT DETECTED Final   Chlamydophila pneumoniae NOT DETECTED NOT DETECTED Final   Mycoplasma pneumoniae NOT DETECTED NOT DETECTED Final    Comment: Performed at [facility], [place] [address], [place], [place] [address]  Culture, blood (Routine X 2) w Reflex to ID Panel     Status: None   Collection Time: 12/20/20  2:28 PM   Specimen: BLOOD LEFT HAND  Result Value Ref Range Status   Specimen Description BLOOD LEFT HAND  Final   Special Requests   Final    BOTTLES DRAWN AEROBIC ONLY Blood Culture results may not be optimal due to an inadequate volume of blood received in culture bottles   Culture   Final    NO GROWTH 5 DAYS Performed at [facility], [place] [address], [place], [place] [address]    Report Status 12/25/2020 FINAL  Final  Culture, blood (Routine X 2) w Reflex to ID Panel     Status: None   Collection Time: 12/20/20  2:31 PM   Specimen: BLOOD RIGHT HAND  Result Value Ref Range Status   Specimen Description BLOOD RIGHT HAND  Final   Special Requests   Final    BOTTLES DRAWN AEROBIC AND ANAEROBIC Blood Culture adequate volume    Culture   Final    NO GROWTH 5 DAYS Performed at [facility], [place] [address], [place], [place] [address]    Report Status 12/25/2020 FINAL  Final   Radiology Studies: No results found.  Scheduled Meds:  acetaminophen  650 mg Oral Once   amitriptyline  100 mg Oral QHS   arformoterol  15 mcg Nebulization BID   calcium carbonate  800 mg of elemental calcium Oral BID   calcium-vitamin D  0.5 tablet Oral BID   Chlorhexidine Gluconate Cloth  6 each Topical Daily   fentaNYL (SUBLIMAZE) injection  100 mcg Intravenous Once   fluticasone  1 spray  Each Nare Daily   gabapentin  300 mg Oral BID   heparin injection (subcutaneous)  5,000 Units Subcutaneous Q8H   insulin aspart  0-15 Units Subcutaneous Q4H   insulin detemir  8 Units Subcutaneous Daily   levothyroxine  150 mcg Oral Q0600   lidocaine  1 patch Transdermal Daily   loratadine  10 mg Oral Daily   mouth rinse  15 mL Mouth Rinse BID   methylPREDNISolone (SOLU-MEDROL) injection  40 mg Intravenous Q24H   pantoprazole  40 mg Oral Daily   pramipexole  1.5 mg Oral TID   revefenacin  175 mcg Nebulization Daily   sodium chloride flush  10-40 mL Intracatheter Q12H   Continuous Infusions:  sodium chloride Stopped (12/21/20 1240)   sodium chloride      ceFAZolin (ANCEF) IV 2 g (12/26/20 0122)     LOS: 8 days   Time spent: 54min  Freddi Forster C Davetta Olliff, DO [facility]  If 7PM-7AM, please contact night-coverage [email or web address]  12/26/2020, 8:10 AM

## 2020-12-27 DIAGNOSIS — R7881 Bacteremia: Secondary | ICD-10-CM | POA: Diagnosis not present

## 2020-12-27 DIAGNOSIS — E871 Hypo-osmolality and hyponatremia: Secondary | ICD-10-CM | POA: Diagnosis not present

## 2020-12-27 DIAGNOSIS — A419 Sepsis, unspecified organism: Secondary | ICD-10-CM | POA: Diagnosis not present

## 2020-12-27 DIAGNOSIS — G9341 Metabolic encephalopathy: Secondary | ICD-10-CM | POA: Diagnosis not present

## 2020-12-27 LAB — CBC
HCT: 25 % — ABNORMAL LOW (ref 36.0–46.0)
Hemoglobin: 8.4 g/dL — ABNORMAL LOW (ref 12.0–15.0)
MCH: 29.1 pg (ref 26.0–34.0)
MCHC: 33.6 g/dL (ref 30.0–36.0)
MCV: 86.5 fL (ref 80.0–100.0)
Platelets: 265 10*3/uL (ref 150–400)
RBC: 2.89 MIL/uL — ABNORMAL LOW (ref 3.87–5.11)
RDW: 16 % — ABNORMAL HIGH (ref 11.5–15.5)
WBC: 9.4 10*3/uL (ref 4.0–10.5)
nRBC: 0 % (ref 0.0–0.2)

## 2020-12-27 LAB — GLUCOSE, CAPILLARY
Glucose-Capillary: 105 mg/dL — ABNORMAL HIGH (ref 70–99)
Glucose-Capillary: 117 mg/dL — ABNORMAL HIGH (ref 70–99)
Glucose-Capillary: 171 mg/dL — ABNORMAL HIGH (ref 70–99)
Glucose-Capillary: 204 mg/dL — ABNORMAL HIGH (ref 70–99)
Glucose-Capillary: 247 mg/dL — ABNORMAL HIGH (ref 70–99)
Glucose-Capillary: 260 mg/dL — ABNORMAL HIGH (ref 70–99)
Glucose-Capillary: 73 mg/dL (ref 70–99)

## 2020-12-27 LAB — BASIC METABOLIC PANEL
Anion gap: 8 (ref 5–15)
BUN: 12 mg/dL (ref 8–23)
CO2: 30 mmol/L (ref 22–32)
Calcium: 8.3 mg/dL — ABNORMAL LOW (ref 8.9–10.3)
Chloride: 91 mmol/L — ABNORMAL LOW (ref 98–111)
Creatinine, Ser: 0.86 mg/dL (ref 0.44–1.00)
GFR, Estimated: >60 mL/min (ref >60)
Glucose, Bld: 143 mg/dL — ABNORMAL HIGH (ref 70–99)
Potassium: 3.8 mmol/L (ref 3.5–5.1)
Sodium: 129 mmol/L — ABNORMAL LOW (ref 135–145)

## 2020-12-27 NOTE — Progress Notes (Signed)
PROGRESS NOTE    Jillian Leblanc  [id number] DOB: March 19, 1945 DOA: 12/18/2020 PCP: Guadlupe Spanish, MD   Brief Narrative:  75 yo woman who presented with back pain after falls, developed sepsis, Ecoli from Blood cultures, now with hypotension and afib with RVR, fever.  Admitted 11/25 pm with SOB, non productive cough, subjective fever, fatigue, muscle aches, and back pain (hadn't completely resolved after prior recent discharge).   Increasing confusion and intermittent somnolence at home, poor po intake.  Admitted to ICU for septic shock requiring pressors through 12/23/2020 in the setting of E. coli bacteremia initial source concern to be UTI with septic discitis in the setting of recent L5-S1 hardware placement.  Assessment & Plan:   Septic shock secondary to E. coli bacteremia, UTI, septic discitis, POA -Patient off pressors as of 12/23/2020 -shock resolved but continues to meet sepsis criteria -Continue ceftriaxone, 6-week duration via PICC line per ID with post abx blood cultures 2 weeks later to ensure clearance of infection -Neurosurgery considering evacuation of I&D and laminectomy as early as next week; however, patient appears to be a poor surgical candidate given her ongoing sepsis criteria weakness and mental status.   Acute metabolic encephalopathy, multifactorial, ongoing -In the setting of septic shock, respiratory failure, AKI, narcotics and potential hospital delirium -Mild hypoglycemia this morning likely contributing to her mental status but resolved with treatment -Will try to limit narcotic use as tolerated given ongoing back pain  Acute respiratory failure with hypoxia, multifactorial Concurrent asthma exacerbation versus COPD exacerbation, resolving Pulmonary edema, resolving -Continue inhaled medication, steroids per previous pulmonology recommendations -Supportive care incentive spirometry, flutter, Mucinex, Tessalon Delsym ongoing -Appears more euvolemic,  hold further diuresis in the setting of minimally elevated creatinine    Acute back pain 2/2 discitis & OM, previous compression fracture -Continue home Elavil, as needed Tylenol, lidocaine patch, out of bed as tolerated per surgical recommendations  -PT OT ongoing, likely disposition SNF once more medically stable -Continue to wean narcotics as possible, continue oxycodone IR with as needed fentanyl IV -Continue gabapentin  Dysphagia -Likely multifactorial in setting of above, follow speech recommendations   Provoked Afib with RVR , new onset, now resolved. -Likely provoked in the setting of septic shock respiratory failure  -Initially requiring amiodarone for rate control; however, with resolution of septic shock and hypoxic respiratory failure heart rate has returned back to normal sinus/   Acute thrombocytopenia, resolved Chronic anemia, likely of chronic disease -Likely secondary to sepsis/septic shock, follow repeat labs  -Resolving with treatment of above -monitor   Uncontrolled diabetes in the setting of hypergylcemia Exacerbated on steroids -A1c 8.4 October of this year -Continue increased long-acting insulin at 8 units daily, sliding scale insulin, hypoglycemic protocol -monitor for hypoglycemia given glucose 41 this morning, holding long-acting insulin today given poor p.o. intake over the past 24 hours  Deconditioning -needs PT & OT -currently recommending SNF  GERD -PPI daily added   CKD 2; AKI resolved Chronic mild hyponatremia -Appears to be chronic, baseline sodium in the low 130s -renally dose meds, avoid nephrotoxic meds -Hold Lasix  Allergic rhinitis -flonase ongoing -Continue claritin  DVT prophylaxis: Heparin subcu Code Status: Full Family Communication: Daughter updated over the phone 12/26/2020  Status is: Inpatient  Dispo: The patient is from: Home              Anticipated d/c is to: SNF              Anticipated d/c date is: 48 to 13  hours               Patient currently not medically stable for discharge  Consultants:  PCCM, ID  Procedures:  None  Antimicrobials:  Cefazolin  Subjective: No acute issues or events overnight, patient more awake alert and oriented this morning in the past 48 hours, appears to be resolving from sepsis criteria, continues to have some pain in her back but marginally improved since previous, otherwise denies chest pain shortness of breath headache fevers chills nausea vomiting or diarrhea  Objective: Vitals:   12/27/20 0528 12/27/20 0530 12/27/20 0602 12/27/20 0759  BP:  (!) 128/52 (!) 130/53 (!) 126/47  Pulse: 88 84 81 78  Resp: 15 14 14 18   Temp:    99.5 F (37.5 C)  TempSrc:    Oral  SpO2: 93% 93% 94% 95%  Weight:      Height:        Intake/Output Summary (Last 24 hours) at 12/27/2020 0809 Last data filed at 12/27/2020 [id number] Gross per 24 hour  Intake 240 ml  Output 3200 ml  Net -2960 ml    Filed Weights   12/23/20 0500 12/24/20 0500 12/25/20 0500  Weight: 94.9 kg 91.1 kg 90.2 kg    Examination:  General:  Pleasantly resting in bed, No acute distress. HEENT:  Normocephalic atraumatic.  Sclerae nonicteric, noninjected.  Extraocular movements intact bilaterally. Neck:  Without mass or deformity.  Trachea is midline. Lungs:  Clear to auscultate bilaterally without rhonchi, wheeze, or rales. Heart:  Regular rate and rhythm.  Without murmurs, rubs, or gallops. Abdomen:  Soft, nontender, nondistended.  Without guarding or rebound. Extremities: Without cyanosis, clubbing, edema, or obvious deformity. Vascular:  Dorsalis pedis and posterior tibial pulses palpable bilaterally. Skin:  Warm and dry, no erythema   Data Reviewed: I have personally reviewed following labs and imaging studies  CBC: Recent Labs  Lab 12/21/20 0134 12/22/20 0017 12/24/20 0446 12/25/20 1836 12/27/20 0356  WBC 3.8* 5.9 6.5 8.9 9.4  NEUTROABS 2.7 5.2 5.6 8.2*  --   HGB 8.6* 9.1* 9.4* 9.0* 8.4*  HCT  24.6* 27.0* 27.7* 26.8* 25.0*  MCV 84.8 86.5 84.5 85.9 86.5  PLT 68* 120*  118* 221 237 [id number]    Basic Metabolic Panel: Recent Labs  Lab 12/22/20 0017 12/23/20 0052 12/24/20 0446 12/24/20 0926 12/24/20 1755 12/25/20 0250 12/25/20 1836 12/26/20 0600 12/27/20 0356  NA 128* 128* 130*  --   --   --  132*  --  129*  K 5.0 4.7 6.1*   < > 5.1 4.7 4.5 4.2 3.8  CL 99 95* 94*  --   --   --  97*  --  91*  CO2 20* 22 25  --   --   --  31  --  30  GLUCOSE 218* 224* 219*  --   --   --  192*  --  143*  BUN 21 24* 27*  --   --   --  21  --  12  CREATININE 1.11* 1.12* 1.15*  --   --   --  0.89  --  0.86  CALCIUM 8.1* 8.8* 8.9  --   --   --  8.5*  --  8.3*  MG 2.1  --   --   --   --   --   --   --   --    < > = values in this interval not displayed.    GFR: Estimated  Creatinine Clearance: 58.3 mL/min (by C-G formula based on SCr of 0.86 mg/dL). Liver Function Tests: Recent Labs  Lab 12/21/20 0134 12/22/20 0017  AST 33 25  ALT 22 22  ALKPHOS 243* 257*  BILITOT 1.1 0.8  PROT 5.8* 6.3*  ALBUMIN 1.8* 1.9*    No results for input(s): LIPASE, AMYLASE in the last 168 hours. No results for input(s): AMMONIA in the last 168 hours. Coagulation Profile: Recent Labs  Lab 12/22/20 0017  INR 1.1    Cardiac Enzymes: No results for input(s): CKTOTAL, CKMB, CKMBINDEX, TROPONINI in the last 168 hours. BNP (last 3 results) No results for input(s): PROBNP in the last 8760 hours. HbA1C: No results for input(s): HGBA1C in the last 72 hours. CBG: Recent Labs  Lab 12/26/20 2038 12/27/20 0024 12/27/20 0423 12/27/20 0522 12/27/20 0757  GLUCAP 265* 105* 73 117* 171*    Lipid Profile: No results for input(s): CHOL, HDL, LDLCALC, TRIG, CHOLHDL, LDLDIRECT in the last 72 hours. Thyroid Function Tests: No results for input(s): TSH, T4TOTAL, FREET4, T3FREE, THYROIDAB in the last 72 hours. Anemia Panel: No results for input(s): VITAMINB12, FOLATE, FERRITIN, TIBC, IRON, RETICCTPCT in the last 72  hours. Sepsis Labs: No results for input(s): PROCALCITON, LATICACIDVEN in the last 168 hours.   Recent Results (from the past 240 hour(s))  Blood Culture (routine x 2)     Status: Abnormal   Collection Time: 12/18/20  7:49 PM   Specimen: BLOOD  Result Value Ref Range Status   Specimen Description BLOOD RIGHT ANTECUBITAL  Final   Special Requests   Final    BOTTLES DRAWN AEROBIC AND ANAEROBIC Blood Culture results may not be optimal due to an excessive volume of blood received in culture bottles   Culture  Setup Time   Final    GRAM NEGATIVE RODS IN BOTH AEROBIC AND ANAEROBIC BOTTLES CRITICAL RESULT CALLED TO, READ BACK BY AND VERIFIED WITH: J,MILLEN PHARMD @0958  12/19/20 EB Performed at [facility], [place] [address], [place], [place] [address]    Culture ESCHERICHIA COLI (A)  Final   Report Status 12/21/2020 FINAL  Final   Organism ID, Bacteria ESCHERICHIA COLI  Final      Susceptibility   Escherichia coli - MIC*    AMPICILLIN 8 SENSITIVE Sensitive     CEFAZOLIN <=4 SENSITIVE Sensitive     CEFEPIME <=0.12 SENSITIVE Sensitive     CEFTAZIDIME <=1 SENSITIVE Sensitive     CEFTRIAXONE <=0.25 SENSITIVE Sensitive     CIPROFLOXACIN <=0.25 SENSITIVE Sensitive     GENTAMICIN <=1 SENSITIVE Sensitive     IMIPENEM <=0.25 SENSITIVE Sensitive     TRIMETH/SULFA <=20 SENSITIVE Sensitive     AMPICILLIN/SULBACTAM <=2 SENSITIVE Sensitive     PIP/TAZO <=4 SENSITIVE Sensitive     * ESCHERICHIA COLI  Blood Culture ID Panel (Reflexed)     Status: Abnormal   Collection Time: 12/18/20  7:49 PM  Result Value Ref Range Status   Enterococcus faecalis NOT DETECTED NOT DETECTED Final   Enterococcus Faecium NOT DETECTED NOT DETECTED Final   Listeria monocytogenes NOT DETECTED NOT DETECTED Final   Staphylococcus species NOT DETECTED NOT DETECTED Final   Staphylococcus aureus (BCID) NOT DETECTED NOT DETECTED Final   Staphylococcus epidermidis NOT DETECTED NOT DETECTED Final   Staphylococcus  lugdunensis NOT DETECTED NOT DETECTED Final   Streptococcus species NOT DETECTED NOT DETECTED Final   Streptococcus agalactiae NOT DETECTED NOT DETECTED Final   Streptococcus pneumoniae NOT DETECTED NOT DETECTED Final   Streptococcus pyogenes  NOT DETECTED NOT DETECTED Final   A.calcoaceticus-baumannii NOT DETECTED NOT DETECTED Final   Bacteroides fragilis NOT DETECTED NOT DETECTED Final   Enterobacterales DETECTED (A) NOT DETECTED Final    Comment: Enterobacterales represent a large order of gram negative bacteria, not a single organism. CRITICAL RESULT CALLED TO, READ BACK BY AND VERIFIED WITH: J,MILLEN PHARMD @0958  12/19/20 EB    Enterobacter cloacae complex NOT DETECTED NOT DETECTED Final   Escherichia coli DETECTED (A) NOT DETECTED Final    Comment: CRITICAL RESULT CALLED TO, READ BACK BY AND VERIFIED WITH: J,MILLEN PHARMD @0958  12/19/20 EB    Klebsiella aerogenes NOT DETECTED NOT DETECTED Final   Klebsiella oxytoca NOT DETECTED NOT DETECTED Final   Klebsiella pneumoniae NOT DETECTED NOT DETECTED Final   Proteus species NOT DETECTED NOT DETECTED Final   Salmonella species NOT DETECTED NOT DETECTED Final   Serratia marcescens NOT DETECTED NOT DETECTED Final   Haemophilus influenzae NOT DETECTED NOT DETECTED Final   Neisseria meningitidis NOT DETECTED NOT DETECTED Final   Pseudomonas aeruginosa NOT DETECTED NOT DETECTED Final   Stenotrophomonas maltophilia NOT DETECTED NOT DETECTED Final   Candida albicans NOT DETECTED NOT DETECTED Final   Candida auris NOT DETECTED NOT DETECTED Final   Candida glabrata NOT DETECTED NOT DETECTED Final   Candida krusei NOT DETECTED NOT DETECTED Final   Candida parapsilosis NOT DETECTED NOT DETECTED Final   Candida tropicalis NOT DETECTED NOT DETECTED Final   Cryptococcus neoformans/gattii NOT DETECTED NOT DETECTED Final   CTX-M ESBL NOT DETECTED NOT DETECTED Final   Carbapenem resistance IMP NOT DETECTED NOT DETECTED Final   Carbapenem resistance  KPC NOT DETECTED NOT DETECTED Final   Carbapenem resistance NDM NOT DETECTED NOT DETECTED Final   Carbapenem resist OXA 48 LIKE NOT DETECTED NOT DETECTED Final   Carbapenem resistance VIM NOT DETECTED NOT DETECTED Final    Comment: Performed at [facility], [address] [place], [address] [place]  Blood Culture (routine x 2)     Status: Abnormal   Collection Time: 12/18/20  8:10 PM   Specimen: BLOOD  Result Value Ref Range Status   Specimen Description BLOOD SITE NOT SPECIFIED  Final   Special Requests AEROBIC BOTTLE ONLY Blood Culture adequate volume  Final   Culture  Setup Time   Final    GRAM NEGATIVE RODS AEROBIC BOTTLE ONLY CRITICAL VALUE NOTED.  VALUE IS CONSISTENT WITH PREVIOUSLY REPORTED AND CALLED VALUE.    Culture (A)  Final    ESCHERICHIA COLI SUSCEPTIBILITIES PERFORMED ON PREVIOUS CULTURE WITHIN THE LAST 5 DAYS. Performed at [facility], [address], [place], [address] [place]    Report Status 12/21/2020 FINAL  Final  Resp Panel by RT-PCR (Flu A&B, Covid) Nasopharyngeal Swab     Status: None   Collection Time: 12/18/20  9:10 PM   Specimen: Nasopharyngeal Swab; Nasopharyngeal(NP) swabs in vial transport medium  Result Value Ref Range Status   SARS Coronavirus 2 by RT PCR NEGATIVE NEGATIVE Final    Comment: (NOTE) SARS-CoV-2 target nucleic acids are NOT DETECTED.  The SARS-CoV-2 RNA is generally detectable in upper respiratory specimens during the acute phase of infection. The lowest concentration of SARS-CoV-2 viral copies this assay can detect is 138 copies/mL. A negative result does not preclude SARS-Cov-2 infection and should not be used as the sole basis for treatment or other patient management decisions. A negative result may occur with  improper specimen collection/handling, submission of specimen other than nasopharyngeal swab, presence of  viral mutation(s) within the areas targeted by this assay, and inadequate number of viral copies(<138  copies/mL). A negative result must be combined with clinical observations, patient history, and epidemiological information. The expected result is Negative.  Fact Sheet for Patients:  BloggerCourse.com  Fact Sheet for Healthcare Providers:  SeriousBroker.it  This test is no t yet approved or cleared by the [place] FDA and  has been authorized for detection and/or diagnosis of SARS-CoV-2 by FDA under an Emergency Use Authorization (EUA). This EUA will remain  in effect (meaning this test can be used) for the duration of the COVID-19 declaration under Section 564(b)(1) of the Act, 21 U.S.C.section 360bbb-3(b)(1), unless the authorization is terminated  or revoked sooner.       Influenza A by PCR NEGATIVE NEGATIVE Final   Influenza B by PCR NEGATIVE NEGATIVE Final    Comment: (NOTE) The Xpert Xpress SARS-CoV-2/FLU/RSV plus assay is intended as an aid in the diagnosis of influenza from Nasopharyngeal swab specimens and should not be used as a sole basis for treatment. Nasal washings and aspirates are unacceptable for Xpert Xpress SARS-CoV-2/FLU/RSV testing.  Fact Sheet for Patients: BloggerCourse.com  Fact Sheet for Healthcare Providers: SeriousBroker.it  This test is not yet approved or cleared by the [place] FDA and has been authorized for detection and/or diagnosis of SARS-CoV-2 by FDA under an Emergency Use Authorization (EUA). This EUA will remain in effect (meaning this test can be used) for the duration of the COVID-19 declaration under Section 564(b)(1) of the Act, 21 U.S.C. section 360bbb-3(b)(1), unless the authorization is terminated or revoked.  Performed at [facility], [address] [place], [place] [address]   MRSA Next Gen by PCR, Nasal     Status: Abnormal   Collection Time: 12/19/20 12:27 AM   Specimen: Nasal Mucosa; Nasal Swab  Result  Value Ref Range Status   MRSA by PCR Next Gen DETECTED (A) NOT DETECTED Final    Comment: RESULT CALLED TO, READ BACK BY AND VERIFIED WITH: C MAS,RN@[id number]  12/19/20 MK (NOTE) The GeneXpert MRSA Assay (FDA approved for NASAL specimens only), is one component of a comprehensive MRSA colonization surveillance program. It is not intended to diagnose MRSA infection nor to guide or monitor treatment for MRSA infections. Test performance is not FDA approved in patients less than 66 years old. Performed at [facility], [address], [place], [place] [address]   Urine Culture     Status: Abnormal   Collection Time: 12/19/20 12:38 AM   Specimen: In/Out Cath Urine  Result Value Ref Range Status   Specimen Description IN/OUT CATH URINE  Final   Special Requests   Final    NONE Performed at [facility], [address], [place], [place] [address]    Culture 1,000 COLONIES/mL ESCHERICHIA COLI (A)  Final   Report Status 12/21/2020 FINAL  Final   Organism ID, Bacteria ESCHERICHIA COLI (A)  Final      Susceptibility   Escherichia coli - MIC*    AMPICILLIN 8 SENSITIVE Sensitive     CEFAZOLIN <=4 SENSITIVE Sensitive     CEFEPIME <=0.12 SENSITIVE Sensitive     CEFTRIAXONE <=0.25 SENSITIVE Sensitive     CIPROFLOXACIN <=0.25 SENSITIVE Sensitive     GENTAMICIN <=1 SENSITIVE Sensitive     IMIPENEM <=0.25 SENSITIVE Sensitive     NITROFURANTOIN <=16 SENSITIVE Sensitive     TRIMETH/SULFA <=20 SENSITIVE Sensitive     AMPICILLIN/SULBACTAM 4 SENSITIVE Sensitive  PIP/TAZO <=4 SENSITIVE Sensitive     * 1,000 COLONIES/mL ESCHERICHIA COLI  Respiratory (~20 pathogens) panel by PCR     Status: None   Collection Time: 12/19/20  9:07 AM   Specimen: Respiratory  Result Value Ref Range Status   Adenovirus NOT DETECTED NOT DETECTED Final   Coronavirus 229E NOT DETECTED NOT DETECTED Final    Comment: (NOTE) The Coronavirus on the Respiratory Panel, DOES NOT test for the novel  Coronavirus (2019  nCoV)    Coronavirus HKU1 NOT DETECTED NOT DETECTED Final   Coronavirus NL63 NOT DETECTED NOT DETECTED Final   Coronavirus OC43 NOT DETECTED NOT DETECTED Final   Metapneumovirus NOT DETECTED NOT DETECTED Final   Rhinovirus / Enterovirus NOT DETECTED NOT DETECTED Final   Influenza A NOT DETECTED NOT DETECTED Final   Influenza B NOT DETECTED NOT DETECTED Final   Parainfluenza Virus 1 NOT DETECTED NOT DETECTED Final   Parainfluenza Virus 2 NOT DETECTED NOT DETECTED Final   Parainfluenza Virus 3 NOT DETECTED NOT DETECTED Final   Parainfluenza Virus 4 NOT DETECTED NOT DETECTED Final   Respiratory Syncytial Virus NOT DETECTED NOT DETECTED Final   Bordetella pertussis NOT DETECTED NOT DETECTED Final   Bordetella Parapertussis NOT DETECTED NOT DETECTED Final   Chlamydophila pneumoniae NOT DETECTED NOT DETECTED Final   Mycoplasma pneumoniae NOT DETECTED NOT DETECTED Final    Comment: Performed at [facility], [place] [place], [place] [address]  Culture, blood (Routine X 2) w Reflex to ID Panel     Status: None   Collection Time: 12/20/20  2:28 PM   Specimen: BLOOD LEFT HAND  Result Value Ref Range Status   Specimen Description BLOOD LEFT HAND  Final   Special Requests   Final    BOTTLES DRAWN AEROBIC ONLY Blood Culture results may not be optimal due to an inadequate volume of blood received in culture bottles   Culture   Final    NO GROWTH 5 DAYS Performed at [facility], [place] [address] [place], [place] [address]    Report Status 12/25/2020 FINAL  Final  Culture, blood (Routine X 2) w Reflex to ID Panel     Status: None   Collection Time: 12/20/20  2:31 PM   Specimen: BLOOD RIGHT HAND  Result Value Ref Range Status   Specimen Description BLOOD RIGHT HAND  Final   Special Requests   Final    BOTTLES DRAWN AEROBIC AND ANAEROBIC Blood Culture adequate volume   Culture   Final    NO GROWTH 5 DAYS Performed at [facility], [place] [address] [place], [place]  [address]    Report Status 12/25/2020 FINAL  Final   Radiology Studies: No results found.  Scheduled Meds:  acetaminophen  650 mg Oral Once   amitriptyline  100 mg Oral QHS   arformoterol  15 mcg Nebulization BID   calcium carbonate  800 mg of elemental calcium Oral BID   calcium-vitamin D  0.5 tablet Oral BID   Chlorhexidine Gluconate Cloth  6 each Topical Daily   fentaNYL (SUBLIMAZE) injection  100 mcg Intravenous Once   fluticasone  1 spray Each Nare Daily   gabapentin  300 mg Oral BID   heparin injection (subcutaneous)  5,000 Units Subcutaneous Q8H   insulin aspart  0-15 Units Subcutaneous Q4H   insulin detemir  8 Units Subcutaneous Daily   levothyroxine  150 mcg Oral Q0600   lidocaine  1 patch Transdermal Daily  loratadine  10 mg Oral Daily   mouth rinse  15 mL Mouth Rinse BID   methylPREDNISolone (SOLU-MEDROL) injection  40 mg Intravenous Q24H   pantoprazole  40 mg Oral Daily   pramipexole  1.5 mg Oral TID   revefenacin  175 mcg Nebulization Daily   sodium chloride flush  10-40 mL Intracatheter Q12H   Continuous Infusions:  sodium chloride Stopped (12/21/20 1240)   sodium chloride      ceFAZolin (ANCEF) IV 2 g (12/27/20 0140)     LOS: 9 days   Time spent: 70min  Sotero Brinkmeyer C Izacc Demeyer, DO [facility]  If 7PM-7AM, please contact night-coverage [email or web address]  12/27/2020, 8:09 AM

## 2020-12-27 NOTE — Progress Notes (Signed)
  NEUROSURGERY PROGRESS NOTE   No issues overnight. Pt denies any worsening weakness in BLE.  EXAM:  BP (!) 126/47 (BP Location: Right Arm)   Pulse 78   Temp 99.5 F (37.5 C) (Oral)   Resp 18   Ht 5' (1.524 m)   Wt 90.2 kg   SpO2 95%   BMI 38.84 kg/m   Awake, alert Speech fluent CN grossly intact  5/5 BUE Good proximal strength BLE Good PF/DF bilaterally   IMPRESSION:  75 y.o. female with likely lumbar SEA on CT. LE motor exam is stable  PLAN: - Cont current mgmt - With stable exam, does not require emergent operative decompression at this time. Cont to monitor neurologic exam, Dr. Maurice Small to re-evaluate tomorrow.   Lisbeth Renshaw, MD [facility]

## 2020-12-27 NOTE — Plan of Care (Signed)
  Problem: Education: °Goal: Knowledge of General Education information will improve °Description: Including pain rating scale, medication(s)/side effects and non-pharmacologic comfort measures °Outcome: Progressing °  °Problem: Health Behavior/Discharge Planning: °Goal: Ability to manage health-related needs will improve °Outcome: Progressing °  °Problem: Clinical Measurements: °Goal: Ability to maintain clinical measurements within normal limits will improve °Outcome: Progressing °Goal: Will remain free from infection °Outcome: Progressing °Goal: Diagnostic test results will improve °Outcome: Progressing °Goal: Respiratory complications will improve °Outcome: Progressing °Goal: Cardiovascular complication will be avoided °Outcome: Progressing °  °Problem: Activity: °Goal: Risk for activity intolerance will decrease °Outcome: Progressing °  °Problem: Nutrition: °Goal: Adequate nutrition will be maintained °Outcome: Progressing °  °Problem: Coping: °Goal: Level of anxiety will decrease °Outcome: Progressing °  °Problem: Elimination: °Goal: Will not experience complications related to bowel motility °Outcome: Progressing °Goal: Will not experience complications related to urinary retention °Outcome: Progressing °  °Problem: Pain Managment: °Goal: General experience of comfort will improve °Outcome: Progressing °  °Problem: Safety: °Goal: Ability to remain free from injury will improve °Outcome: Progressing °  °Problem: Skin Integrity: °Goal: Risk for impaired skin integrity will decrease °Outcome: Progressing °  °Problem: Fluid Volume: °Goal: Hemodynamic stability will improve °Outcome: Progressing °  °Problem: Clinical Measurements: °Goal: Diagnostic test results will improve °Outcome: Progressing °Goal: Signs and symptoms of infection will decrease °Outcome: Progressing °  °Problem: Respiratory: °Goal: Ability to maintain adequate ventilation will improve °Outcome: Progressing °  °Problem: Urinary  Elimination: °Goal: Signs and symptoms of infection will decrease °Outcome: Progressing °  °Problem: Education: °Goal: Knowledge of disease or condition will improve °Outcome: Progressing °Goal: Understanding of medication regimen will improve °Outcome: Progressing °Goal: Individualized Educational Video(s) °Outcome: Progressing °  °Problem: Activity: °Goal: Ability to tolerate increased activity will improve °Outcome: Progressing °  °Problem: Cardiac: °Goal: Ability to achieve and maintain adequate cardiopulmonary perfusion will improve °Outcome: Progressing °  °Problem: Health Behavior/Discharge Planning: °Goal: Ability to safely manage health-related needs after discharge will improve °Outcome: Progressing °  °

## 2020-12-28 DIAGNOSIS — A419 Sepsis, unspecified organism: Secondary | ICD-10-CM | POA: Diagnosis not present

## 2020-12-28 DIAGNOSIS — E871 Hypo-osmolality and hyponatremia: Secondary | ICD-10-CM | POA: Diagnosis not present

## 2020-12-28 DIAGNOSIS — R7881 Bacteremia: Secondary | ICD-10-CM | POA: Diagnosis not present

## 2020-12-28 DIAGNOSIS — G9341 Metabolic encephalopathy: Secondary | ICD-10-CM | POA: Diagnosis not present

## 2020-12-28 LAB — CBC
HCT: 24.9 % — ABNORMAL LOW (ref 36.0–46.0)
Hemoglobin: 8.3 g/dL — ABNORMAL LOW (ref 12.0–15.0)
MCH: 29.7 pg (ref 26.0–34.0)
MCHC: 33.3 g/dL (ref 30.0–36.0)
MCV: 89.2 fL (ref 80.0–100.0)
Platelets: 266 10*3/uL (ref 150–400)
RBC: 2.79 MIL/uL — ABNORMAL LOW (ref 3.87–5.11)
RDW: 16.3 % — ABNORMAL HIGH (ref 11.5–15.5)
WBC: 6.2 10*3/uL (ref 4.0–10.5)
nRBC: 0 % (ref 0.0–0.2)

## 2020-12-28 LAB — GLUCOSE, CAPILLARY
Glucose-Capillary: 129 mg/dL — ABNORMAL HIGH (ref 70–99)
Glucose-Capillary: 148 mg/dL — ABNORMAL HIGH (ref 70–99)
Glucose-Capillary: 150 mg/dL — ABNORMAL HIGH (ref 70–99)
Glucose-Capillary: 168 mg/dL — ABNORMAL HIGH (ref 70–99)
Glucose-Capillary: 265 mg/dL — ABNORMAL HIGH (ref 70–99)
Glucose-Capillary: 281 mg/dL — ABNORMAL HIGH (ref 70–99)
Glucose-Capillary: 305 mg/dL — ABNORMAL HIGH (ref 70–99)

## 2020-12-28 LAB — BASIC METABOLIC PANEL
Anion gap: 6 (ref 5–15)
BUN: 12 mg/dL (ref 8–23)
CO2: 34 mmol/L — ABNORMAL HIGH (ref 22–32)
Calcium: 8 mg/dL — ABNORMAL LOW (ref 8.9–10.3)
Chloride: 90 mmol/L — ABNORMAL LOW (ref 98–111)
Creatinine, Ser: 0.8 mg/dL (ref 0.44–1.00)
GFR, Estimated: >60 mL/min (ref >60)
Glucose, Bld: 156 mg/dL — ABNORMAL HIGH (ref 70–99)
Potassium: 3.8 mmol/L (ref 3.5–5.1)
Sodium: 130 mmol/L — ABNORMAL LOW (ref 135–145)

## 2020-12-28 NOTE — Progress Notes (Signed)
PROGRESS NOTE    Jillian Leblanc  ZOX:[id number] DOB: 04/15/1945 DOA: 12/18/2020 PCP: Karle Plumber, MD   Brief Narrative:  75 yo woman who presented with back pain after falls, developed sepsis, Ecoli from Blood cultures, now with hypotension and afib with RVR, fever.  Admitted 11/25 pm with SOB, non productive cough, subjective fever, fatigue, muscle aches, and back pain (hadn't completely resolved after prior recent discharge).   Increasing confusion and intermittent somnolence at home, poor po intake.  Admitted to ICU for septic shock requiring pressors through 12/23/2020 in the setting of E. coli bacteremia initial source concern to be UTI with septic discitis in the setting of recent L5-S1 hardware placement.  Assessment & Plan:  Septic shock secondary to E. coli bacteremia, UTI, septic discitis, POA -Patient off pressors as of 12/23/2020 -shock resolved but continues to meet sepsis criteria -Continue ceftriaxone, 6-week duration via PICC line per ID with post abx blood cultures 2 weeks later to ensure clearance of infection -Neurosurgery considering evacuation of I&D and laminectomy as early as next week; however, patient appears to be a poor surgical candidate given her ongoing sepsis criteria weakness and mental status. -Foley exchange for purewick on 12/28/20   Acute metabolic encephalopathy, multifactorial, ongoing -In the setting of septic shock, respiratory failure, AKI, narcotics and potential hospital delirium -Mild hypoglycemia this morning likely contributing to her mental status but resolved with treatment -Will try to limit narcotic use as tolerated given ongoing back pain  Acute respiratory failure with hypoxia, multifactorial Concurrent asthma exacerbation versus COPD exacerbation, resolving Pulmonary edema, resolving -Continue inhaled medication, steroids per previous pulmonology recommendations -Supportive care incentive spirometry, flutter, Mucinex, Tessalon  Delsym ongoing -Appears more euvolemic, hold further diuresis in the setting of minimally elevated creatinine    Acute back pain 2/2 discitis & OM, previous compression fracture -Continue home Elavil, as needed Tylenol, lidocaine patch, out of bed as tolerated per surgical recommendations  -PT OT ongoing, likely disposition SNF once more medically stable -Continue to wean narcotics as possible, continue oxycodone IR with as needed fentanyl IV -Continue gabapentin  Dysphagia -Likely multifactorial in setting of above, follow speech recommendations   Provoked Afib with RVR , new onset, now resolved. -Likely provoked in the setting of septic shock respiratory failure  -Initially requiring amiodarone for rate control; however, with resolution of septic shock and hypoxic respiratory failure heart rate has returned back to normal sinus/   Acute thrombocytopenia, resolved Chronic anemia, likely of chronic disease -Likely secondary to sepsis/septic shock, follow repeat labs  -Resolving with treatment of above -monitor   Uncontrolled diabetes in the setting of hypergylcemia Exacerbated on steroids -A1c 8.4 October of this year -Continue increased long-acting insulin at 8 units daily, sliding scale insulin, hypoglycemic protocol -monitor for hypoglycemia given glucose 41 this morning, holding long-acting insulin today given poor p.o. intake over the past 24 hours  Deconditioning -needs PT & OT -currently recommending SNF  GERD -PPI daily added   CKD 2; AKI resolved Chronic mild hyponatremia -Appears to be chronic, baseline sodium in the low 130s -renally dose meds, avoid nephrotoxic meds -Hold Lasix  Allergic rhinitis -flonase ongoing -Continue claritin  DVT prophylaxis: Heparin subcu Code Status: Full Family Communication: Daughter updated over the phone 12/26/2020  Status is: Inpatient  Dispo: The patient is from: Home              Anticipated d/c is to: SNF               Anticipated  d/c date is: 48 to 72 hours              Patient currently not medically stable for discharge  Consultants:  PCCM, ID  Procedures:  None  Antimicrobials:  Cefazolin  Subjective: No acute issues or events overnight, denies nausea vomiting diarrhea constipation headache fevers chills or chest pain.  Back pain somewhat ongoing but improving, generally feels well compared to previous but not yet back to baseline.  She is sluggish to answer questions and remains somewhat confused about her medical care likely in the setting of recent narcotic administration.  Objective: Vitals:   12/28/20 0000 12/28/20 0400 12/28/20 0500 12/28/20 0749  BP: 132/62 131/63  (!) 147/64  Pulse:  60  60  Resp: Temp: 98.1 F (36.7 C) 97.9 F (36.6 C)  97.7 F (36.5 C)  TempSrc: Axillary Oral  Oral  SpO2: 100% 98%  98%  Weight:   87.6 kg   Height:        Intake/Output Summary (Last 24 hours) at 12/28/2020 0754 Last data filed at 12/28/2020 0506 Gross per 24 hour  Intake 1520.06 ml  Output 2250 ml  Net -729.94 ml    Filed Weights   12/24/20 0500 12/25/20 0500 12/28/20 0500  Weight: 91.1 kg 90.2 kg 87.6 kg    Examination:  General:  Pleasantly resting in bed, No acute distress. HEENT:  Normocephalic atraumatic.  Sclerae nonicteric, noninjected.  Extraocular movements intact bilaterally. Neck:  Without mass or deformity.  Trachea is midline. Lungs:  Clear to auscultate bilaterally without rhonchi, wheeze, or rales. Heart:  Regular rate and rhythm.  Without murmurs, rubs, or gallops. Abdomen:  Soft, nontender, nondistended.  Without guarding or rebound. Extremities: Without cyanosis, clubbing, edema, or obvious deformity. Vascular:  Dorsalis pedis and posterior tibial pulses palpable bilaterally. Skin:  Warm and dry, no erythema   Data Reviewed: I have personally reviewed following labs and imaging studies  CBC: Recent Labs  Lab 12/22/20 0017 12/24/20 0446  12/25/20 1836 12/27/20 0356 12/28/20 0330  WBC 5.9 6.5 8.9 9.4 6.2  NEUTROABS 5.2 5.6 8.2*  --   --   HGB 9.1* 9.4* 9.0* 8.4* 8.3*  HCT 27.0* 27.7* 26.8* 25.0* 24.9*  MCV 86.5 84.5 85.9 86.5 89.2  PLT 120*  118* 221 237 265 266    Basic Metabolic Panel: Recent Labs  Lab 12/22/20 0017 12/23/20 0052 12/24/20 0446 12/24/20 0926 12/25/20 0250 12/25/20 1836 12/26/20 0600 12/27/20 0356 12/28/20 0330  NA 128* 128* 130*  --   --  132*  --  129* 130*  K 5.0 4.7 6.1*   < > 4.7 4.5 4.2 3.8 3.8  CL 99 95* 94*  --   --  97*  --  91* 90*  CO2 20* 22 25  --   --  31  --  30 34*  GLUCOSE 218* 224* 219*  --   --  192*  --  143* 156*  BUN 21 24* 27*  --   --  21  --  12 12  CREATININE 1.11* 1.12* 1.15*  --   --  0.89  --  0.86 0.80  CALCIUM 8.1* 8.8* 8.9  --   --  8.5*  --  8.3* 8.0*  MG 2.1  --   --   --   --   --   --   --   --    < > = values in this interval not displayed.  GFR: Estimated Creatinine Clearance: 61.6 mL/min (by C-G formula based on SCr of 0.8 mg/dL). Liver Function Tests: Recent Labs  Lab 12/22/20 0017  AST 25  ALT 22  ALKPHOS 257*  BILITOT 0.8  PROT 6.3*  ALBUMIN 1.9*    No results for input(s): LIPASE, AMYLASE in the last 168 hours. No results for input(s): AMMONIA in the last 168 hours. Coagulation Profile: Recent Labs  Lab 12/22/20 0017  INR 1.1    Cardiac Enzymes: No results for input(s): CKTOTAL, CKMB, CKMBINDEX, TROPONINI in the last 168 hours. BNP (last 3 results) No results for input(s): PROBNP in the last 8760 hours. HbA1C: No results for input(s): HGBA1C in the last 72 hours. CBG: Recent Labs  Lab 12/27/20 1149 12/27/20 1549 12/27/20 2041 12/27/20 2359 12/28/20 0352  GLUCAP 204* 260* 247* 168* 148*    Lipid Profile: No results for input(s): CHOL, HDL, LDLCALC, TRIG, CHOLHDL, LDLDIRECT in the last 72 hours. Thyroid Function Tests: No results for input(s): TSH, T4TOTAL, FREET4, T3FREE, THYROIDAB in the last 72 hours. Anemia  Panel: No results for input(s): VITAMINB12, FOLATE, FERRITIN, TIBC, IRON, RETICCTPCT in the last 72 hours. Sepsis Labs: No results for input(s): PROCALCITON, LATICACIDVEN in the last 168 hours.   Recent Results (from the past 240 hour(s))  Blood Culture (routine x 2)     Status: Abnormal   Collection Time: 12/18/20  7:49 PM   Specimen: BLOOD  Result Value Ref Range Status   Specimen Description BLOOD RIGHT ANTECUBITAL  Final   Special Requests   Final    BOTTLES DRAWN AEROBIC AND ANAEROBIC Blood Culture results may not be optimal due to an excessive volume of blood received in culture bottles   Culture  Setup Time   Final    GRAM NEGATIVE RODS IN BOTH AEROBIC AND ANAEROBIC BOTTLES CRITICAL RESULT CALLED TO, READ BACK BY AND VERIFIED WITH: J,MILLEN PHARMD  12/19/20 EB Performed at [facility], [address] [place], [place] [address]    Culture ESCHERICHIA COLI (A)  Final   Report Status 12/21/2020 FINAL  Final   Organism ID, Bacteria ESCHERICHIA COLI  Final      Susceptibility   Escherichia coli - MIC*    AMPICILLIN 8 SENSITIVE Sensitive     CEFAZOLIN <=4 SENSITIVE Sensitive     CEFEPIME <=0.12 SENSITIVE Sensitive     CEFTAZIDIME <=1 SENSITIVE Sensitive     CEFTRIAXONE <=0.25 SENSITIVE Sensitive     CIPROFLOXACIN <=0.25 SENSITIVE Sensitive     GENTAMICIN <=1 SENSITIVE Sensitive     IMIPENEM <=0.25 SENSITIVE Sensitive     TRIMETH/SULFA <=20 SENSITIVE Sensitive     AMPICILLIN/SULBACTAM <=2 SENSITIVE Sensitive     PIP/TAZO <=4 SENSITIVE Sensitive     * ESCHERICHIA COLI  Blood Culture ID Panel (Reflexed)     Status: Abnormal   Collection Time: 12/18/20  7:49 PM  Result Value Ref Range Status   Enterococcus faecalis NOT DETECTED NOT DETECTED Final   Enterococcus Faecium NOT DETECTED NOT DETECTED Final   Listeria monocytogenes NOT DETECTED NOT DETECTED Final   Staphylococcus species NOT DETECTED NOT DETECTED Final   Staphylococcus aureus (BCID) NOT DETECTED NOT  DETECTED Final   Staphylococcus epidermidis NOT DETECTED NOT DETECTED Final   Staphylococcus lugdunensis NOT DETECTED NOT DETECTED Final   Streptococcus species NOT DETECTED NOT DETECTED Final   Streptococcus agalactiae NOT DETECTED NOT DETECTED Final   Streptococcus pneumoniae NOT DETECTED NOT DETECTED Final   Streptococcus pyogenes NOT DETECTED NOT DETECTED Final  A.calcoaceticus-baumannii NOT DETECTED NOT DETECTED Final   Bacteroides fragilis NOT DETECTED NOT DETECTED Final   Enterobacterales DETECTED (A) NOT DETECTED Final    Comment: Enterobacterales represent a large order of gram negative bacteria, not a single organism. CRITICAL RESULT CALLED TO, READ BACK BY AND VERIFIED WITH: J,MILLEN PHARMD @0958  12/19/20 EB    Enterobacter cloacae complex NOT DETECTED NOT DETECTED Final   Escherichia coli DETECTED (A) NOT DETECTED Final    Comment: CRITICAL RESULT CALLED TO, READ BACK BY AND VERIFIED WITH: J,MILLEN PHARMD @0958  12/19/20 EB    Klebsiella aerogenes NOT DETECTED NOT DETECTED Final   Klebsiella oxytoca NOT DETECTED NOT DETECTED Final   Klebsiella pneumoniae NOT DETECTED NOT DETECTED Final   Proteus species NOT DETECTED NOT DETECTED Final   Salmonella species NOT DETECTED NOT DETECTED Final   Serratia marcescens NOT DETECTED NOT DETECTED Final   Haemophilus influenzae NOT DETECTED NOT DETECTED Final   Neisseria meningitidis NOT DETECTED NOT DETECTED Final   Pseudomonas aeruginosa NOT DETECTED NOT DETECTED Final   Stenotrophomonas maltophilia NOT DETECTED NOT DETECTED Final   Candida albicans NOT DETECTED NOT DETECTED Final   Candida auris NOT DETECTED NOT DETECTED Final   Candida glabrata NOT DETECTED NOT DETECTED Final   Candida krusei NOT DETECTED NOT DETECTED Final   Candida parapsilosis NOT DETECTED NOT DETECTED Final   Candida tropicalis NOT DETECTED NOT DETECTED Final   Cryptococcus neoformans/gattii NOT DETECTED NOT DETECTED Final   CTX-M ESBL NOT DETECTED NOT  DETECTED Final   Carbapenem resistance IMP NOT DETECTED NOT DETECTED Final   Carbapenem resistance KPC NOT DETECTED NOT DETECTED Final   Carbapenem resistance NDM NOT DETECTED NOT DETECTED Final   Carbapenem resist OXA 48 LIKE NOT DETECTED NOT DETECTED Final   Carbapenem resistance VIM NOT DETECTED NOT DETECTED Final    Comment: Performed at [facility], [address] [place], [address] [place]  Blood Culture (routine x 2)     Status: Abnormal   Collection Time: 12/18/20  8:10 PM   Specimen: BLOOD  Result Value Ref Range Status   Specimen Description BLOOD SITE NOT SPECIFIED  Final   Special Requests AEROBIC BOTTLE ONLY Blood Culture adequate volume  Final   Culture  Setup Time   Final    GRAM NEGATIVE RODS AEROBIC BOTTLE ONLY CRITICAL VALUE NOTED.  VALUE IS CONSISTENT WITH PREVIOUSLY REPORTED AND CALLED VALUE.    Culture (A)  Final    ESCHERICHIA COLI SUSCEPTIBILITIES PERFORMED ON PREVIOUS CULTURE WITHIN THE LAST 5 DAYS. Performed at [facility], [address] [place], [address] [place]    Report Status 12/21/2020 FINAL  Final  Resp Panel by RT-PCR (Flu A&B, Covid) Nasopharyngeal Swab     Status: None   Collection Time: 12/18/20  9:10 PM   Specimen: Nasopharyngeal Swab; Nasopharyngeal(NP) swabs in vial transport medium  Result Value Ref Range Status   SARS Coronavirus 2 by RT PCR NEGATIVE NEGATIVE Final    Comment: (NOTE) SARS-CoV-2 target nucleic acids are NOT DETECTED.  The SARS-CoV-2 RNA is generally detectable in upper respiratory specimens during the acute phase of infection. The lowest concentration of SARS-CoV-2 viral copies this assay can detect is 138 copies/mL. A negative result does not preclude SARS-Cov-2 infection and should not be used as the sole basis for treatment or other patient management decisions. A negative result may occur with  improper specimen collection/handling, submission of specimen other than nasopharyngeal swab, presence of  viral mutation(s) within the areas targeted  by this assay, and inadequate number of viral copies(<138 copies/mL). A negative result must be combined with clinical observations, patient history, and epidemiological information. The expected result is Negative.  Fact Sheet for Patients:  BloggerCourse.com  Fact Sheet for Healthcare Providers:  SeriousBroker.it  This test is no t yet approved or cleared by the [place] FDA and  has been authorized for detection and/or diagnosis of SARS-CoV-2 by FDA under an Emergency Use Authorization (EUA). This EUA will remain  in effect (meaning this test can be used) for the duration of the COVID-19 declaration under Section 564(b)(1) of the Act, 21 U.S.C.section 360bbb-3(b)(1), unless the authorization is terminated  or revoked sooner.       Influenza A by PCR NEGATIVE NEGATIVE Final   Influenza B by PCR NEGATIVE NEGATIVE Final    Comment: (NOTE) The Xpert Xpress SARS-CoV-2/FLU/RSV plus assay is intended as an aid in the diagnosis of influenza from Nasopharyngeal swab specimens and should not be used as a sole basis for treatment. Nasal washings and aspirates are unacceptable for Xpert Xpress SARS-CoV-2/FLU/RSV testing.  Fact Sheet for Patients: BloggerCourse.com  Fact Sheet for Healthcare Providers: SeriousBroker.it  This test is not yet approved or cleared by the [place] FDA and has been authorized for detection and/or diagnosis of SARS-CoV-2 by FDA under an Emergency Use Authorization (EUA). This EUA will remain in effect (meaning this test can be used) for the duration of the COVID-19 declaration under Section 564(b)(1) of the Act, 21 U.S.C. section 360bbb-3(b)(1), unless the authorization is terminated or revoked.  Performed at [facility], [address] [place], [place] [address]   MRSA Next Gen by PCR, Nasal      Status: Abnormal   Collection Time: 12/19/20 12:27 AM   Specimen: Nasal Mucosa; Nasal Swab  Result Value Ref Range Status   MRSA by PCR Next Gen DETECTED (A) NOT DETECTED Final    Comment: RESULT CALLED TO, READ BACK BY AND VERIFIED WITH: C MAS,RN@[id number]  12/19/20 MK (NOTE) The GeneXpert MRSA Assay (FDA approved for NASAL specimens only), is one component of a comprehensive MRSA colonization surveillance program. It is not intended to diagnose MRSA infection nor to guide or monitor treatment for MRSA infections. Test performance is not FDA approved in patients less than 79 years old. Performed at [facility], [address] [place], [place] [address]   Urine Culture     Status: Abnormal   Collection Time: 12/19/20 12:38 AM   Specimen: In/Out Cath Urine  Result Value Ref Range Status   Specimen Description IN/OUT CATH URINE  Final   Special Requests   Final    NONE Performed at [facility], [address], [place], [place] [address]    Culture 1,000 COLONIES/mL ESCHERICHIA COLI (A)  Final   Report Status 12/21/2020 FINAL  Final   Organism ID, Bacteria ESCHERICHIA COLI (A)  Final      Susceptibility   Escherichia coli - MIC*    AMPICILLIN 8 SENSITIVE Sensitive     CEFAZOLIN <=4 SENSITIVE Sensitive     CEFEPIME <=0.12 SENSITIVE Sensitive     CEFTRIAXONE <=0.25 SENSITIVE Sensitive     CIPROFLOXACIN <=0.25 SENSITIVE Sensitive     GENTAMICIN <=1 SENSITIVE Sensitive     IMIPENEM <=0.25 SENSITIVE Sensitive     NITROFURANTOIN <=16 SENSITIVE Sensitive     TRIMETH/SULFA <=20 SENSITIVE Sensitive     AMPICILLIN/SULBACTAM 4 SENSITIVE Sensitive     PIP/TAZO <=4 SENSITIVE Sensitive     *  1,000 COLONIES/mL ESCHERICHIA COLI  Respiratory (~20 pathogens) panel by PCR     Status: None   Collection Time: 12/19/20  9:07 AM   Specimen: Respiratory  Result Value Ref Range Status   Adenovirus NOT DETECTED NOT DETECTED Final   Coronavirus 229E NOT DETECTED NOT DETECTED Final     Comment: (NOTE) The Coronavirus on the Respiratory Panel, DOES NOT test for the novel  Coronavirus (2019 nCoV)    Coronavirus HKU1 NOT DETECTED NOT DETECTED Final   Coronavirus NL63 NOT DETECTED NOT DETECTED Final   Coronavirus OC43 NOT DETECTED NOT DETECTED Final   Metapneumovirus NOT DETECTED NOT DETECTED Final   Rhinovirus / Enterovirus NOT DETECTED NOT DETECTED Final   Influenza A NOT DETECTED NOT DETECTED Final   Influenza B NOT DETECTED NOT DETECTED Final   Parainfluenza Virus 1 NOT DETECTED NOT DETECTED Final   Parainfluenza Virus 2 NOT DETECTED NOT DETECTED Final   Parainfluenza Virus 3 NOT DETECTED NOT DETECTED Final   Parainfluenza Virus 4 NOT DETECTED NOT DETECTED Final   Respiratory Syncytial Virus NOT DETECTED NOT DETECTED Final   Bordetella pertussis NOT DETECTED NOT DETECTED Final   Bordetella Parapertussis NOT DETECTED NOT DETECTED Final   Chlamydophila pneumoniae NOT DETECTED NOT DETECTED Final   Mycoplasma pneumoniae NOT DETECTED NOT DETECTED Final    Comment: Performed at [facility], [address] [place], [place] [address]  Culture, blood (Routine X 2) w Reflex to ID Panel     Status: None   Collection Time: 12/20/20  2:28 PM   Specimen: BLOOD LEFT HAND  Result Value Ref Range Status   Specimen Description BLOOD LEFT HAND  Final   Special Requests   Final    BOTTLES DRAWN AEROBIC ONLY Blood Culture results may not be optimal due to an inadequate volume of blood received in culture bottles   Culture   Final    NO GROWTH 5 DAYS Performed at [facility], [address], [place], [place] [address]    Report Status 12/25/2020 FINAL  Final  Culture, blood (Routine X 2) w Reflex to ID Panel     Status: None   Collection Time: 12/20/20  2:31 PM   Specimen: BLOOD RIGHT HAND  Result Value Ref Range Status   Specimen Description BLOOD RIGHT HAND  Final   Special Requests   Final    BOTTLES DRAWN AEROBIC AND ANAEROBIC Blood Culture adequate volume    Culture   Final    NO GROWTH 5 DAYS Performed at [facility], [address], [place], [place] [address]    Report Status 12/25/2020 FINAL  Final   Radiology Studies: No results found.  Scheduled Meds:  acetaminophen  650 mg Oral Once   amitriptyline  100 mg Oral QHS   arformoterol  15 mcg Nebulization BID   calcium carbonate  800 mg of elemental calcium Oral BID   calcium-vitamin D  0.5 tablet Oral BID   Chlorhexidine Gluconate Cloth  6 each Topical Daily   fentaNYL (SUBLIMAZE) injection  100 mcg Intravenous Once   fluticasone  1 spray Each Nare Daily   gabapentin  300 mg Oral BID   heparin injection (subcutaneous)  5,000 Units Subcutaneous Q8H   insulin aspart  0-15 Units Subcutaneous Q4H   insulin detemir  8 Units Subcutaneous Daily   levothyroxine  150 mcg Oral Q0600   lidocaine  1 patch Transdermal Daily   loratadine  10 mg Oral Daily  mouth rinse  15 mL Mouth Rinse BID   methylPREDNISolone (SOLU-MEDROL) injection  40 mg Intravenous Q24H   pantoprazole  40 mg Oral Daily   pramipexole  1.5 mg Oral TID   revefenacin  175 mcg Nebulization Daily   sodium chloride flush  10-40 mL Intracatheter Q12H   Continuous Infusions:  sodium chloride Stopped (12/21/20 1240)   sodium chloride      ceFAZolin (ANCEF) IV 2 g (12/28/20 0152)     LOS: 10 days   Time spent:  Azucena Fallen, DO [facility]  If 7PM-7AM, please contact night-coverage [email or web address]  12/28/2020, 7:54 AM

## 2020-12-28 NOTE — TOC Progression Note (Signed)
Transition of Care [facility]) - Progression Note    Patient Details  Name: Jillian Leblanc MRN: [id number] Date of Birth: 05-30-1945  Transition of Care [facility]) CM/SW Contact  Mearl Latin, LCSW Phone Number: 12/28/2020, 5:16 PM  Clinical Narrative:    CSW updated [place] that patient is not ready for discharge.      Barriers to Discharge: Continued Medical Work up  Expected Discharge Plan and Services   In-house Referral: Clinical Social Work   Post Acute Care Choice: Skilled Nursing Facility Living arrangements for the past 2 months: Single Family Home                                       Social Determinants of Health (SDOH) Interventions    Readmission Risk Interventions No flowsheet data found.

## 2020-12-28 NOTE — Plan of Care (Signed)
  Problem: Education: °Goal: Knowledge of General Education information will improve °Description: Including pain rating scale, medication(s)/side effects and non-pharmacologic comfort measures °Outcome: Progressing °  °Problem: Health Behavior/Discharge Planning: °Goal: Ability to manage health-related needs will improve °Outcome: Progressing °  °Problem: Clinical Measurements: °Goal: Ability to maintain clinical measurements within normal limits will improve °Outcome: Progressing °Goal: Will remain free from infection °Outcome: Progressing °Goal: Diagnostic test results will improve °Outcome: Progressing °Goal: Respiratory complications will improve °Outcome: Progressing °Goal: Cardiovascular complication will be avoided °Outcome: Progressing °  °Problem: Activity: °Goal: Risk for activity intolerance will decrease °Outcome: Progressing °  °Problem: Nutrition: °Goal: Adequate nutrition will be maintained °Outcome: Progressing °  °Problem: Coping: °Goal: Level of anxiety will decrease °Outcome: Progressing °  °Problem: Elimination: °Goal: Will not experience complications related to bowel motility °Outcome: Progressing °Goal: Will not experience complications related to urinary retention °Outcome: Progressing °  °Problem: Pain Managment: °Goal: General experience of comfort will improve °Outcome: Progressing °  °Problem: Safety: °Goal: Ability to remain free from injury will improve °Outcome: Progressing °  °Problem: Skin Integrity: °Goal: Risk for impaired skin integrity will decrease °Outcome: Progressing °  °Problem: Fluid Volume: °Goal: Hemodynamic stability will improve °Outcome: Progressing °  °Problem: Clinical Measurements: °Goal: Diagnostic test results will improve °Outcome: Progressing °Goal: Signs and symptoms of infection will decrease °Outcome: Progressing °  °Problem: Respiratory: °Goal: Ability to maintain adequate ventilation will improve °Outcome: Progressing °  °Problem: Urinary  Elimination: °Goal: Signs and symptoms of infection will decrease °Outcome: Progressing °  °Problem: Education: °Goal: Knowledge of disease or condition will improve °Outcome: Progressing °Goal: Understanding of medication regimen will improve °Outcome: Progressing °Goal: Individualized Educational Video(s) °Outcome: Progressing °  °Problem: Activity: °Goal: Ability to tolerate increased activity will improve °Outcome: Progressing °  °Problem: Cardiac: °Goal: Ability to achieve and maintain adequate cardiopulmonary perfusion will improve °Outcome: Progressing °  °Problem: Health Behavior/Discharge Planning: °Goal: Ability to safely manage health-related needs after discharge will improve °Outcome: Progressing °  °

## 2020-12-28 NOTE — Progress Notes (Signed)
Speech Language Pathology Treatment: Dysphagia  Patient Details Name: Jillian Leblanc MRN: [id number] DOB: 11/17/1945 Today's Date: 12/28/2020 Time: 1349-1406 SLP Time Calculation (min) (ACUTE ONLY): 17 min  Assessment / Plan / Recommendation Clinical Impression  Mentation appears to be improved since last seen by SLP - pt consumed crackers followed by thin liquids with active and thorough mastication, the appearance of a brisk swallow, and no s/sx of aspiration.  Voice strong.  Needs some assist with set-up but was able to feed herself.  Lung sounds have been stable and there are no indications of dysphagia-related complications to health.  Recommend advancing diet to regular solids, thin liquids.  No further SLP f/u is needed - our service will sign off.   HPI HPI: 75 y/o female admitted 11/25 with severe sepsis with UTI and possible L5-S1 abscess/osteo. Pt with Afib with RVR, hypotension and AMS with transfer to ICU 11/27. Not currently sx candidate. PMHx:  DM, PPM, neuropathy, falls, L5 fx Oct '22      SLP Plan  All goals met      Recommendations for follow up therapy are one component of a multi-disciplinary discharge planning process, led by the attending physician.  Recommendations may be updated based on patient status, additional functional criteria and insurance authorization.    Recommendations  Diet recommendations: Regular;Thin liquid Liquids provided via: Cup;Straw Medication Administration: Whole meds with puree Supervision: Patient able to self feed;Staff to assist with self feeding Compensations: Minimize environmental distractions Postural Changes and/or Swallow Maneuvers: Seated upright 90 degrees                Oral Care Recommendations: Oral care BID SLP Visit Diagnosis: Dysphagia, unspecified (R13.10) Plan: All goals met       GO               L. , MA CCC/SLP Acute Rehabilitation Services Office number [phone] Pager  [phone]   ,  Laurice  12/28/2020, 2:08 PM 

## 2020-12-29 DIAGNOSIS — R7881 Bacteremia: Secondary | ICD-10-CM | POA: Diagnosis not present

## 2020-12-29 DIAGNOSIS — G9341 Metabolic encephalopathy: Secondary | ICD-10-CM | POA: Diagnosis not present

## 2020-12-29 DIAGNOSIS — A419 Sepsis, unspecified organism: Secondary | ICD-10-CM | POA: Diagnosis not present

## 2020-12-29 DIAGNOSIS — E871 Hypo-osmolality and hyponatremia: Secondary | ICD-10-CM | POA: Diagnosis not present

## 2020-12-29 LAB — GLUCOSE, CAPILLARY
Glucose-Capillary: 118 mg/dL — ABNORMAL HIGH (ref 70–99)
Glucose-Capillary: 149 mg/dL — ABNORMAL HIGH (ref 70–99)
Glucose-Capillary: 155 mg/dL — ABNORMAL HIGH (ref 70–99)
Glucose-Capillary: 169 mg/dL — ABNORMAL HIGH (ref 70–99)
Glucose-Capillary: 172 mg/dL — ABNORMAL HIGH (ref 70–99)
Glucose-Capillary: 97 mg/dL (ref 70–99)

## 2020-12-29 NOTE — Progress Notes (Signed)
Neurosurgery Service Progress Note  Subjective: No acute events overnight, stable low back pain, no radicular pain, no neck pain   Objective: Vitals:   12/29/20 0400 12/29/20 0428 12/29/20 0739 12/29/20 0905  BP: 138/62  (!) 119/57   Pulse: 82  60   Resp: 17  15   Temp: 98.3 F (36.8 C)  98.3 F (36.8 C)   TempSrc: Axillary  Axillary   SpO2: 96%  94% 91%  Weight:  87.5 kg    Height:        Physical Exam: Awake/alert, lying in bed, strength mildly pain limited in the proximal BLE but strength 5/5x4, SILTx4, no hoffman's, no clonus   Assessment & Plan: 75 y.o. 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   -discussed w/ the pt at length, given that she has no weakness on exam and is clinically improving, would not recommend surgery at this point. Advised her to call my office or have family / staff call immediately if she develops any new weakness of the lower extremities or any new difficulties with bowel or bladder control. From my perspective, no evidence of any progressive neurologic worsening while she's been inpatient, okay with discharge from my perspective.   Clovis Pu Kolette Vey  12/29/20 10:03 AM

## 2020-12-29 NOTE — Progress Notes (Signed)
PROGRESS NOTE    Jillian Leblanc  [id number] DOB: 03-30-1945 DOA: 12/18/2020 PCP: Guadlupe Spanish, MD   Brief Narrative:  75 yo woman who presented with back pain after falls, developed sepsis, Ecoli from Blood cultures, now with hypotension and afib with RVR, fever.  Admitted 11/25 pm with SOB, non productive cough, subjective fever, fatigue, muscle aches, and back pain (hadn't completely resolved after prior recent discharge).   Increasing confusion and intermittent somnolence at home, poor po intake.  Admitted to ICU for septic shock requiring pressors through 12/23/2020 in the setting of E. coli bacteremia initial source concern to be UTI with septic discitis in the setting of recent L5-S1 hardware placement.  Assessment & Plan:  Septic shock secondary to E. coli bacteremia, UTI, septic discitis, POA -Patient off pressors as of 12/23/2020 -shock resolved but continues to meet sepsis criteria -Continue ceftriaxone, 6-week duration via PICC line per ID with post abx blood cultures 2 weeks later to ensure clearance of infection -Neurosurgery recommending conservative nonoperative management and continued IV antibiotics -Foley exchange for purewick on 12/28/20 -Disposition to SNF pending bed availability   Acute metabolic encephalopathy, multifactorial, resolving -In the setting of septic shock, respiratory failure, AKI, narcotics and potential hospital delirium -Mild hypoglycemia this morning likely contributing to her mental status but resolved with treatment -Will try to limit narcotic use as tolerated given ongoing back pain -Mental status improving, not yet back to baseline per family but markedly improved since admission  Acute respiratory failure with hypoxia, multifactorial Concurrent asthma exacerbation versus COPD exacerbation, resolving Pulmonary edema, resolving -Continue inhaled medication, steroids per previous pulmonology recommendations -Supportive care incentive  spirometry, flutter, Mucinex, Tessalon Delsym ongoing -Appears more euvolemic, diuretics previously held due to elevated creatinine  Acute back pain 2/2 discitis & OM, previous compression fracture -Continue home Elavil, as needed Tylenol, lidocaine patch, out of bed as tolerated per surgical recommendations  -PT OT ongoing, likely disposition SNF once more medically stable -Continue to wean IV narcotics as possible -Currently on oxycodone IR with as needed fentanyl IV -Continue gabapentin  Dysphagia -Likely multifactorial in setting of above, follow speech recommendations   Provoked Afib with RVR , new onset, now resolved. -Likely provoked in the setting of septic shock respiratory failure  -Initially requiring amiodarone for rate control; however, with resolution of septic shock and hypoxic respiratory failure heart rate has returned back to normal sinus/   Acute thrombocytopenia, resolved Chronic anemia, likely of chronic disease -Likely secondary to sepsis/septic shock, follow repeat labs  -Resolving with treatment of above -monitor   Uncontrolled diabetes in the setting of hypergylcemia Exacerbated on steroids -A1c 8.4 October of this year -Continue increased long-acting insulin at 8 units daily, sliding scale insulin, hypoglycemic protocol -monitor for hypoglycemia given glucose 41 this morning, holding long-acting insulin today given poor p.o. intake over the past 24 hours  Deconditioning -needs PT & OT -currently recommending SNF  GERD -PPI daily added   CKD 2; AKI resolved Chronic mild hyponatremia -Appears to be chronic, baseline sodium in the low 130s -renally dose meds, avoid nephrotoxic meds -Hold Lasix  Allergic rhinitis -flonase ongoing -Continue claritin  DVT prophylaxis: Heparin subcu Code Status: Full Family Communication: Daughter updated over the phone 12/28/2020  Status is: Inpatient  Dispo: The patient is from: Home              Anticipated d/c  is to: SNF              Anticipated d/c date is:  24 to 48 hours              Patient currently not medically stable for discharge  Consultants:  PCCM, ID  Procedures:  None  Antimicrobials:  Cefazolin  Subjective: No acute issues or events overnight, continues to have back pain but markedly improved from previous, remains somewhat sluggish to answer questions but approaching baseline per discussion with daughter.  Objective: Vitals:   12/29/20 0000 12/29/20 0400 12/29/20 0428 12/29/20 0739  BP: (!) 154/68 138/62  (!) 119/57  Pulse:  82  60  Resp: 12 17  15   Temp:  98.3 F (36.8 C)  98.3 F (36.8 C)  TempSrc:  Axillary  Axillary  SpO2:  96%  94%  Weight:   87.5 kg   Height:        Intake/Output Summary (Last 24 hours) at 12/29/2020 0804 Last data filed at 12/29/2020 0129 Gross per 24 hour  Intake 811.28 ml  Output 2650 ml  Net -1838.72 ml    Filed Weights   12/25/20 0500 12/28/20 0500 12/29/20 0428  Weight: 90.2 kg 87.6 kg 87.5 kg    Examination:  General:  Pleasantly resting in bed, No acute distress. HEENT:  Normocephalic atraumatic.  Sclerae nonicteric, noninjected.  Extraocular movements intact bilaterally. Neck:  Without mass or deformity.  Trachea is midline. Lungs:  Clear to auscultate bilaterally without rhonchi, wheeze, or rales. Heart:  Regular rate and rhythm.  Without murmurs, rubs, or gallops. Abdomen:  Soft, nontender, nondistended.  Without guarding or rebound. Extremities: Without cyanosis, clubbing, edema, or obvious deformity. Vascular:  Dorsalis pedis and posterior tibial pulses palpable bilaterally. Skin:  Warm and dry, no erythema   Data Reviewed: I have personally reviewed following labs and imaging studies  CBC: Recent Labs  Lab 12/24/20 0446 12/25/20 1836 12/27/20 0356 12/28/20 0330  WBC 6.5 8.9 9.4 6.2  NEUTROABS 5.6 8.2*  --   --   HGB 9.4* 9.0* 8.4* 8.3*  HCT 27.7* 26.8* 25.0* 24.9*  MCV 84.5 85.9 86.5 89.2  PLT 221 237  [id number]    Basic Metabolic Panel: Recent Labs  Lab 12/23/20 0052 12/24/20 0446 12/24/20 0926 12/25/20 0250 12/25/20 1836 12/26/20 0600 12/27/20 0356 12/28/20 0330  NA 128* 130*  --   --  132*  --  129* 130*  K 4.7 6.1*   < > 4.7 4.5 4.2 3.8 3.8  CL 95* 94*  --   --  97*  --  91* 90*  CO2 22 25  --   --  31  --  30 34*  GLUCOSE 224* 219*  --   --  192*  --  143* 156*  BUN 24* 27*  --   --  21  --  12 12  CREATININE 1.12* 1.15*  --   --  0.89  --  0.86 0.80  CALCIUM 8.8* 8.9  --   --  8.5*  --  8.3* 8.0*   < > = values in this interval not displayed.    GFR: Estimated Creatinine Clearance: 61.6 mL/min (by C-G formula based on SCr of 0.8 mg/dL). Liver Function Tests: No results for input(s): AST, ALT, ALKPHOS, BILITOT, PROT, ALBUMIN in the last 168 hours.  No results for input(s): LIPASE, AMYLASE in the last 168 hours. No results for input(s): AMMONIA in the last 168 hours. Coagulation Profile: No results for input(s): INR, PROTIME in the last 168 hours.  Cardiac Enzymes: No results for input(s): CKTOTAL, CKMB, CKMBINDEX, TROPONINI in  the last 168 hours. BNP (last 3 results) No results for input(s): PROBNP in the last 8760 hours. HbA1C: No results for input(s): HGBA1C in the last 72 hours. CBG: Recent Labs  Lab 12/28/20 1616 12/28/20 2013 12/28/20 2355 12/29/20 0427 12/29/20 0743  GLUCAP 305* 281* 150* 97 118*    Lipid Profile: No results for input(s): CHOL, HDL, LDLCALC, TRIG, CHOLHDL, LDLDIRECT in the last 72 hours. Thyroid Function Tests: No results for input(s): TSH, T4TOTAL, FREET4, T3FREE, THYROIDAB in the last 72 hours. Anemia Panel: No results for input(s): VITAMINB12, FOLATE, FERRITIN, TIBC, IRON, RETICCTPCT in the last 72 hours. Sepsis Labs: No results for input(s): PROCALCITON, LATICACIDVEN in the last 168 hours.   Recent Results (from the past 240 hour(s))  Respiratory (~20 pathogens) panel by PCR     Status: None   Collection Time: 12/19/20   9:07 AM   Specimen: Respiratory  Result Value Ref Range Status   Adenovirus NOT DETECTED NOT DETECTED Final   Coronavirus 229E NOT DETECTED NOT DETECTED Final    Comment: (NOTE) The Coronavirus on the Respiratory Panel, DOES NOT test for the novel  Coronavirus (2019 nCoV)    Coronavirus HKU1 NOT DETECTED NOT DETECTED Final   Coronavirus NL63 NOT DETECTED NOT DETECTED Final   Coronavirus OC43 NOT DETECTED NOT DETECTED Final   Metapneumovirus NOT DETECTED NOT DETECTED Final   Rhinovirus / Enterovirus NOT DETECTED NOT DETECTED Final   Influenza A NOT DETECTED NOT DETECTED Final   Influenza B NOT DETECTED NOT DETECTED Final   Parainfluenza Virus 1 NOT DETECTED NOT DETECTED Final   Parainfluenza Virus 2 NOT DETECTED NOT DETECTED Final   Parainfluenza Virus 3 NOT DETECTED NOT DETECTED Final   Parainfluenza Virus 4 NOT DETECTED NOT DETECTED Final   Respiratory Syncytial Virus NOT DETECTED NOT DETECTED Final   Bordetella pertussis NOT DETECTED NOT DETECTED Final   Bordetella Parapertussis NOT DETECTED NOT DETECTED Final   Chlamydophila pneumoniae NOT DETECTED NOT DETECTED Final   Mycoplasma pneumoniae NOT DETECTED NOT DETECTED Final    Comment: Performed at [facility], [place] [address] [place], [place] [address]  Culture, blood (Routine X 2) w Reflex to ID Panel     Status: None   Collection Time: 12/20/20  2:28 PM   Specimen: BLOOD LEFT HAND  Result Value Ref Range Status   Specimen Description BLOOD LEFT HAND  Final   Special Requests   Final    BOTTLES DRAWN AEROBIC ONLY Blood Culture results may not be optimal due to an inadequate volume of blood received in culture bottles   Culture   Final    NO GROWTH 5 DAYS Performed at [facility], [place] [address], [place], [place] [address]    Report Status 12/25/2020 FINAL  Final  Culture, blood (Routine X 2) w Reflex to ID Panel     Status: None   Collection Time: 12/20/20  2:31 PM   Specimen: BLOOD RIGHT HAND  Result Value  Ref Range Status   Specimen Description BLOOD RIGHT HAND  Final   Special Requests   Final    BOTTLES DRAWN AEROBIC AND ANAEROBIC Blood Culture adequate volume   Culture   Final    NO GROWTH 5 DAYS Performed at [facility], [place] [address], [place], [place] [address]    Report Status 12/25/2020 FINAL  Final   Radiology Studies: No results found.  Scheduled Meds:  acetaminophen  650 mg Oral Once   amitriptyline  100  mg Oral QHS   arformoterol  15 mcg Nebulization BID   calcium carbonate  800 mg of elemental calcium Oral BID   calcium-vitamin D  0.5 tablet Oral BID   Chlorhexidine Gluconate Cloth  6 each Topical Daily   fentaNYL (SUBLIMAZE) injection  100 mcg Intravenous Once   fluticasone  1 spray Each Nare Daily   gabapentin  300 mg Oral BID   heparin injection (subcutaneous)  5,000 Units Subcutaneous Q8H   insulin aspart  0-15 Units Subcutaneous Q4H   insulin detemir  8 Units Subcutaneous Daily   levothyroxine  150 mcg Oral Q0600   lidocaine  1 patch Transdermal Daily   loratadine  10 mg Oral Daily   mouth rinse  15 mL Mouth Rinse BID   methylPREDNISolone (SOLU-MEDROL) injection  40 mg Intravenous Q24H   pantoprazole  40 mg Oral Daily   pramipexole  1.5 mg Oral TID   revefenacin  175 mcg Nebulization Daily   sodium chloride flush  10-40 mL Intracatheter Q12H   Continuous Infusions:  sodium chloride Stopped (12/21/20 1240)   sodium chloride      ceFAZolin (ANCEF) IV 2 g (12/29/20 0123)     LOS: 11 days   Time spent:  Azucena Fallen, DO [facility]  If 7PM-7AM, please contact night-coverage [email or web address]  12/29/2020, 8:04 AM

## 2020-12-29 NOTE — Progress Notes (Signed)
Inpatient Diabetes Program Recommendations  AACE/ADA: New Consensus Statement on Inpatient Glycemic Control (2015)  Target Ranges:  Prepandial:   less than 140 mg/dL      Peak postprandial:   less than 180 mg/dL (1-2 hours)      Critically ill patients:  140 - 180 mg/dL   Lab Results  Component Value Date   GLUCAP 118 (H) 12/29/2020   HGBA1C 8.4 (H) 11/23/2020    Review of Glycemic Control  Latest Reference Range & Units 12/28/20 20:13 12/28/20 23:55 12/29/20 04:27 12/29/20 07:43  Glucose-Capillary 70 - 99 mg/dL 465 (H) 035 (H) 97 465 (H)  (H): Data is abnormally high Diabetes history: Type 2 Dm Outpatient Diabetes medications: Farxiga 10 mg QD, Glipizide 2.5 mg QD Current orders for Inpatient glycemic control: Novolog 0-15 units Q4H, Levemir 8 units QD Solumedrol 40 mg QD  Inpatient Diabetes Program Recommendations:    Recommend : -changing correction to TID & HS - in the setting of steroids, consider adding Novolog 3 units TID (assuming patient is consuming >50% of meals) - changing diet to carb modified.   Thanks, Lujean Rave, MSN, RNC-OB Diabetes Coordinator [phone] (8a-5p)

## 2020-12-29 NOTE — Plan of Care (Signed)
  Problem: Education: °Goal: Knowledge of General Education information will improve °Description: Including pain rating scale, medication(s)/side effects and non-pharmacologic comfort measures °Outcome: Progressing °  °Problem: Health Behavior/Discharge Planning: °Goal: Ability to manage health-related needs will improve °Outcome: Progressing °  °Problem: Clinical Measurements: °Goal: Ability to maintain clinical measurements within normal limits will improve °Outcome: Progressing °Goal: Will remain free from infection °Outcome: Progressing °Goal: Diagnostic test results will improve °Outcome: Progressing °Goal: Respiratory complications will improve °Outcome: Progressing °Goal: Cardiovascular complication will be avoided °Outcome: Progressing °  °Problem: Activity: °Goal: Risk for activity intolerance will decrease °Outcome: Progressing °  °Problem: Nutrition: °Goal: Adequate nutrition will be maintained °Outcome: Progressing °  °Problem: Coping: °Goal: Level of anxiety will decrease °Outcome: Progressing °  °Problem: Elimination: °Goal: Will not experience complications related to bowel motility °Outcome: Progressing °Goal: Will not experience complications related to urinary retention °Outcome: Progressing °  °Problem: Pain Managment: °Goal: General experience of comfort will improve °Outcome: Progressing °  °Problem: Safety: °Goal: Ability to remain free from injury will improve °Outcome: Progressing °  °Problem: Skin Integrity: °Goal: Risk for impaired skin integrity will decrease °Outcome: Progressing °  °Problem: Fluid Volume: °Goal: Hemodynamic stability will improve °Outcome: Progressing °  °Problem: Clinical Measurements: °Goal: Diagnostic test results will improve °Outcome: Progressing °Goal: Signs and symptoms of infection will decrease °Outcome: Progressing °  °Problem: Respiratory: °Goal: Ability to maintain adequate ventilation will improve °Outcome: Progressing °  °Problem: Urinary  Elimination: °Goal: Signs and symptoms of infection will decrease °Outcome: Progressing °  °Problem: Education: °Goal: Knowledge of disease or condition will improve °Outcome: Progressing °Goal: Understanding of medication regimen will improve °Outcome: Progressing °Goal: Individualized Educational Video(s) °Outcome: Progressing °  °Problem: Activity: °Goal: Ability to tolerate increased activity will improve °Outcome: Progressing °  °Problem: Cardiac: °Goal: Ability to achieve and maintain adequate cardiopulmonary perfusion will improve °Outcome: Progressing °  °Problem: Health Behavior/Discharge Planning: °Goal: Ability to safely manage health-related needs after discharge will improve °Outcome: Progressing °  °

## 2020-12-30 ENCOUNTER — Inpatient Hospital Stay (HOSPITAL_COMMUNITY): Payer: Medicare Other

## 2020-12-30 DIAGNOSIS — E871 Hypo-osmolality and hyponatremia: Secondary | ICD-10-CM | POA: Diagnosis not present

## 2020-12-30 DIAGNOSIS — R7881 Bacteremia: Secondary | ICD-10-CM | POA: Diagnosis not present

## 2020-12-30 DIAGNOSIS — D649 Anemia, unspecified: Secondary | ICD-10-CM | POA: Diagnosis not present

## 2020-12-30 DIAGNOSIS — G9341 Metabolic encephalopathy: Secondary | ICD-10-CM | POA: Diagnosis not present

## 2020-12-30 LAB — CBC
HCT: 23.4 % — ABNORMAL LOW (ref 36.0–46.0)
Hemoglobin: 7.6 g/dL — ABNORMAL LOW (ref 12.0–15.0)
MCH: 28.7 pg (ref 26.0–34.0)
MCHC: 32.5 g/dL (ref 30.0–36.0)
MCV: 88.3 fL (ref 80.0–100.0)
Platelets: 251 10*3/uL (ref 150–400)
RBC: 2.65 MIL/uL — ABNORMAL LOW (ref 3.87–5.11)
RDW: 16.4 % — ABNORMAL HIGH (ref 11.5–15.5)
WBC: 7.7 10*3/uL (ref 4.0–10.5)
nRBC: 0 % (ref 0.0–0.2)

## 2020-12-30 LAB — BASIC METABOLIC PANEL
Anion gap: 9 (ref 5–15)
BUN: 14 mg/dL (ref 8–23)
CO2: 31 mmol/L (ref 22–32)
Calcium: 7.9 mg/dL — ABNORMAL LOW (ref 8.9–10.3)
Chloride: 86 mmol/L — ABNORMAL LOW (ref 98–111)
Creatinine, Ser: 0.87 mg/dL (ref 0.44–1.00)
GFR, Estimated: >60 mL/min (ref >60)
Glucose, Bld: 121 mg/dL — ABNORMAL HIGH (ref 70–99)
Potassium: 3.2 mmol/L — ABNORMAL LOW (ref 3.5–5.1)
Sodium: 126 mmol/L — ABNORMAL LOW (ref 135–145)

## 2020-12-30 LAB — URINALYSIS, ROUTINE W REFLEX MICROSCOPIC
Bilirubin Urine: NEGATIVE
Glucose, UA: NEGATIVE mg/dL
Ketones, ur: NEGATIVE mg/dL
Nitrite: NEGATIVE
Protein, ur: NEGATIVE mg/dL
Specific Gravity, Urine: <1.005 — ABNORMAL LOW (ref 1.005–1.030)
pH: 7 (ref 5.0–8.0)

## 2020-12-30 LAB — URINALYSIS, MICROSCOPIC (REFLEX)

## 2020-12-30 LAB — GLUCOSE, CAPILLARY
Glucose-Capillary: 175 mg/dL — ABNORMAL HIGH (ref 70–99)
Glucose-Capillary: 123 mg/dL — ABNORMAL HIGH (ref 70–99)
Glucose-Capillary: 122 mg/dL — ABNORMAL HIGH (ref 70–99)
Glucose-Capillary: 142 mg/dL — ABNORMAL HIGH (ref 70–99)
Glucose-Capillary: 286 mg/dL — ABNORMAL HIGH (ref 70–99)
Glucose-Capillary: 330 mg/dL — ABNORMAL HIGH (ref 70–99)

## 2020-12-30 MED ORDER — FUROSEMIDE 40 MG PO TABS
40.0000 mg | ORAL_TABLET | Freq: Every day | ORAL | Status: DC
  Administered 2020-12-30 – 2020-12-31 (×2): 40 mg via ORAL
  Filled 2020-12-30 (×2): qty 1

## 2020-12-30 MED ORDER — POTASSIUM CHLORIDE CRYS ER 20 MEQ PO TBCR
40.0000 meq | EXTENDED_RELEASE_TABLET | ORAL | Status: AC
  Administered 2020-12-30 (×2): 40 meq via ORAL
  Filled 2020-12-30 (×2): qty 2

## 2020-12-30 MED ORDER — POLYETHYLENE GLYCOL 3350 17 G PO PACK
17.0000 g | PACK | Freq: Every day | ORAL | Status: DC
  Administered 2020-12-30 – 2021-01-05 (×6): 17 g via ORAL
  Filled 2020-12-30 (×7): qty 1

## 2020-12-30 NOTE — Progress Notes (Signed)
PROGRESS NOTE        PATIENT DETAILS Name: Jillian Leblanc Age: 75 y.o. Sex: female Date of Birth: Mar 30, 1945 Admit Date: 12/18/2020 Admitting Physician Angie Fava, DO ZOX:[id number], Idelia Salm, MD  Brief Narrative: [name] a 75 y.o. 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  She was ultimately found to have E. coli bacteremia with vertebral osteomyelitis involving L4-S1  Subjective: Lying comfortably in bed-complaining of pain in the left foot elbow area.  Objective: Vitals: Blood pressure (!) 115/55, pulse 63, temperature 98 F (36.7 C), temperature source Oral, resp. rate 16, height 5' (1.524 m), weight 87.3 kg, SpO2 97 %.   Exam: Gen Exam:Alert awake-not in any distress.  Looks chronically sick appearing. HEENT:atraumatic, normocephalic Chest: B/L clear to auscultation anteriorly CVS:S1S2 regular Abdomen:soft non tender, non distended Extremities:trace edema-left forearm/wrist swollen.  Elbow slightly tender. Neurology: Non focal Skin: no rash  Pertinent Labs/Radiology: Recent Labs  Lab 12/30/20 0609  WBC 7.7  HGB 7.6*  PLT 251  NA 126*  K 3.2*  CREATININE 0.87     Assessment/Plan: Septic shock due to E. 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  Does have a PICC line in place-left elbow area somewhat swollen and tender but no obvious signs of infection.  Obtaining x-ray of left elbow-repeat UA and blood cultures today.  Continue IV Ancef x6 weeks per ID recommendations.Per neurosurgery-since no obvious weakness on exam-does not require surgery.  Acute metabolic  encephalopathy: Due to septic shock/AKI/hospital delirium-improving.  Continue supportive care-maintain delirium precautions.  Acute hypoxic respiratory failure: Multifactorial etiology-due to asthma exacerbation and pulmonary edema.  Clinically improved-but now developing third spacing-we will restart diuretics.  Continue bronchodilators-stop IV Solu-Medrol.  PAF with RVR: Maintaining sinus rhythm-initially required amiodarone-reviewed cardiology note-anticoagulation issue to be addressed upon outpatient follow-up.  Thrombocytopenia: Resolved-likely due to sepsis.  Anemia: Multifactorial etiology-but mostly due to acute illness-no evidence of blood loss-follow closely and transfuse if Hb <7.  Hypokalemia: Replete and recheck.  Hyponatremia: Developing volume overload-restarting diuretics.  HFpEF: Developing third spacing-will start diuretics and follow.  DM-2 (A1c 8.27 October 2020): CBGs stable-continue Levemir 8 units daily and SSI-follow and adjust.  Watch closely as steroids discontinued on 12/7.  Recent Labs    12/30/20 0108 12/30/20 0407 12/30/20 0732  GLUCAP 175* 123* 122*    Hypothyroidism: Continue Synthroid-both TSH/free T4 elevated on recent labs-likely due to sick euthyroid syndrome-recommendations are to repeat thyroid function test in a few weeks.  GERD: Continue PPI  Dysphagia: Tolerating dysphagia 3 diet-I suspect this is from deconditioning.  SLP following.  Back pain: Due to discitis-prior compression fractures.  Continue with as needed narcotics.  Morbid Obesity Estimated body mass index is 37.59 kg/m as calculated from the following:   Height as of this encounter: 5' (1.524 m).   Weight as of this encounter: 87.3 kg.    Procedures: None Consults: DM, ID, neurosurgery DVT Prophylaxis: Heparin Code Status:Full code  Family Communication: None at bedside  Time spent: 35 minutes-Greater than 50% of this time was spent in counseling, explanation of  diagnosis, planning of  further management, and coordination of care.   Disposition Plan: Status is: Inpatient  Remains inpatient appropriate because: Bacteremia-epidural abscess-lumbar osteomyelitis-febrile-hyponatremia-not yet stable for discharge.    Diet: Diet Order             Diet regular Room service appropriate? Yes with Assist; Fluid consistency: Thin  Diet effective now                     Antimicrobial agents: Anti-infectives (From admission, onward)    Start     Dose/Rate Route Frequency Ordered Stop   12/21/20 1800  ceFAZolin (ANCEF) IVPB 2g/100 mL premix        2 g 200 mL/hr over 30 Minutes Intravenous Every 8 hours 12/21/20 1216     12/20/20 1800  cefTRIAXone (ROCEPHIN) 2 g in sodium chloride 0.9 % 100 mL IVPB  Status:  Discontinued        2 g 200 mL/hr over 30 Minutes Intravenous Every 24 hours 12/20/20 0920 12/21/20 1216   12/20/20 0100  meropenem (MERREM) 1 g in sodium chloride 0.9 % 100 mL IVPB  Status:  Discontinued        1 g 200 mL/hr over 30 Minutes Intravenous Every 8 hours 12/20/20 0004 12/20/20 0920   12/19/20 2000  ceFEPIme (MAXIPIME) 2 g in sodium chloride 0.9 % 100 mL IVPB  Status:  Discontinued        2 g 200 mL/hr over 30 Minutes Intravenous Every 24 hours 12/19/20 0057 12/19/20 1023   12/19/20 2000  cefTRIAXone (ROCEPHIN) 2 g in sodium chloride 0.9 % 100 mL IVPB  Status:  Discontinued        2 g 200 mL/hr over 30 Minutes Intravenous Every 24 hours 12/19/20 1023 12/20/20 0004   12/19/20 0057  vancomycin variable dose per unstable renal function (pharmacist dosing)  Status:  Discontinued         Does not apply See admin instructions 12/19/20 0057 12/19/20 1023   12/18/20 2000  ceFEPIme (MAXIPIME) 2 g in sodium chloride 0.9 % 100 mL IVPB        2 g 200 mL/hr over 30 Minutes Intravenous  Once 12/18/20 1955 12/18/20 2050   12/18/20 2000  metroNIDAZOLE (FLAGYL) IVPB 500 mg        500 mg 100 mL/hr over 60 Minutes Intravenous  Once 12/18/20  1955 12/18/20 2210   12/18/20 2000  vancomycin (VANCOREADY) IVPB 1750 mg/350 mL        1,750 mg 175 mL/hr over 120 Minutes Intravenous  Once 12/18/20 1955 12/19/20 0045        MEDICATIONS: Scheduled Meds:  acetaminophen  650 mg Oral Once   amitriptyline  100 mg Oral QHS   arformoterol  15 mcg Nebulization BID   calcium carbonate  800 mg of elemental calcium Oral BID   calcium-vitamin D  0.5 tablet Oral BID   Chlorhexidine Gluconate Cloth  6 each Topical Daily   fentaNYL (SUBLIMAZE) injection  100 mcg Intravenous Once   fluticasone  1 spray Each Nare Daily   gabapentin  300 mg Oral BID   heparin injection (subcutaneous)  5,000 Units Subcutaneous Q8H   insulin aspart  0-15 Units Subcutaneous Q4H   insulin detemir  8 Units Subcutaneous Daily   levothyroxine  150 mcg Oral Q0600   lidocaine  1 patch Transdermal Daily   loratadine  10 mg Oral Daily   mouth rinse  15 mL Mouth Rinse BID   methylPREDNISolone (SOLU-MEDROL) injection  40  mg Intravenous Q24H   pantoprazole  40 mg Oral Daily   pramipexole  1.5 mg Oral TID   revefenacin  175 mcg Nebulization Daily   sodium chloride flush  10-40 mL Intracatheter Q12H   Continuous Infusions:  sodium chloride Stopped (12/21/20 1240)   sodium chloride      ceFAZolin (ANCEF) IV 2 g (12/30/20 0116)   PRN Meds:.sodium chloride, acetaminophen **OR** acetaminophen, benzonatate, dextromethorphan, docusate sodium, fentaNYL (SUBLIMAZE) injection, guaiFENesin, hydrOXYzine, ipratropium-albuterol, lip balm, methocarbamol, naLOXone (NARCAN)  injection, oxyCODONE, phenol, polyethylene glycol, sodium chloride flush   I have personally reviewed following labs and imaging studies  LABORATORY DATA: CBC: Recent Labs  Lab 12/24/20 0446 12/25/20 1836 12/27/20 0356 12/28/20 0330 12/30/20 0609  WBC 6.5 8.9 9.4 6.2 7.7  NEUTROABS 5.6 8.2*  --   --   --   HGB 9.4* 9.0* 8.4* 8.3* 7.6*  HCT 27.7* 26.8* 25.0* 24.9* 23.4*  MCV 84.5 85.9 86.5 89.2 88.3   PLT 221 237 265 266 251    Basic Metabolic Panel: Recent Labs  Lab 12/24/20 0446 12/24/20 0926 12/25/20 1836 12/26/20 0600 12/27/20 0356 12/28/20 0330 12/30/20 0609  NA 130*  --  132*  --  129* 130* 126*  K 6.1*   < > 4.5 4.2 3.8 3.8 3.2*  CL 94*  --  97*  --  91* 90* 86*  CO2 25  --  31  --  30 34* 31  GLUCOSE 219*  --  192*  --  143* 156* 121*  BUN 27*  --  21  --  12 12 14   CREATININE 1.15*  --  0.89  --  0.86 0.80 0.87  CALCIUM 8.9  --  8.5*  --  8.3* 8.0* 7.9*   < > = values in this interval not displayed.    GFR: Estimated Creatinine Clearance: 56.5 mL/min (by C-G formula based on SCr of 0.87 mg/dL).  Liver Function Tests: No results for input(s): AST, ALT, ALKPHOS, BILITOT, PROT, ALBUMIN in the last 168 hours. No results for input(s): LIPASE, AMYLASE in the last 168 hours. No results for input(s): AMMONIA in the last 168 hours.  Coagulation Profile: No results for input(s): INR, PROTIME in the last 168 hours.  Cardiac Enzymes: No results for input(s): CKTOTAL, CKMB, CKMBINDEX, TROPONINI in the last 168 hours.  BNP (last 3 results) No results for input(s): PROBNP in the last 8760 hours.  Lipid Profile: No results for input(s): CHOL, HDL, LDLCALC, TRIG, CHOLHDL, LDLDIRECT in the last 72 hours.  Thyroid Function Tests: No results for input(s): TSH, T4TOTAL, FREET4, T3FREE, THYROIDAB in the last 72 hours.  Anemia Panel: No results for input(s): VITAMINB12, FOLATE, FERRITIN, TIBC, IRON, RETICCTPCT in the last 72 hours.  Urine analysis:    Component Value Date/Time   COLORURINE YELLOW 12/19/2020 0057   APPEARANCEUR HAZY (A) 12/19/2020 0057   LABSPEC 1.011 12/19/2020 0057   PHURINE 5.0 12/19/2020 0057   GLUCOSEU >=500 (A) 12/19/2020 0057   HGBUR LARGE (A) 12/19/2020 0057   BILIRUBINUR NEGATIVE 12/19/2020 0057   KETONESUR 5 (A) 12/19/2020 0057   PROTEINUR 30 (A) 12/19/2020 0057   UROBILINOGEN 0.2 09/15/2012 1536   NITRITE NEGATIVE 12/19/2020 0057    LEUKOCYTESUR MODERATE (A) 12/19/2020 0057    Sepsis Labs: Lactic Acid, Venous    Component Value Date/Time   LATICACIDVEN 1.5 12/20/2020 0429    MICROBIOLOGY: Recent Results (from the past 240 hour(s))  Culture, blood (Routine X 2) w Reflex to ID Panel  Status: None   Collection Time: 12/20/20  2:28 PM   Specimen: BLOOD LEFT HAND  Result Value Ref Range Status   Specimen Description BLOOD LEFT HAND  Final   Special Requests   Final    BOTTLES DRAWN AEROBIC ONLY Blood Culture results may not be optimal due to an inadequate volume of blood received in culture bottles   Culture   Final    NO GROWTH 5 DAYS Performed at [facility], [address] [place], [place] [address]    Report Status 12/25/2020 FINAL  Final  Culture, blood (Routine X 2) w Reflex to ID Panel     Status: None   Collection Time: 12/20/20  2:31 PM   Specimen: BLOOD RIGHT HAND  Result Value Ref Range Status   Specimen Description BLOOD RIGHT HAND  Final   Special Requests   Final    BOTTLES DRAWN AEROBIC AND ANAEROBIC Blood Culture adequate volume   Culture   Final    NO GROWTH 5 DAYS Performed at [facility], [address], [place], [place] [address]    Report Status 12/25/2020 FINAL  Final    RADIOLOGY STUDIES/RESULTS: No results found.   LOS: 12 days   Jeoffrey Massed, MD  [facility]    To contact the attending provider between 7A-7P or the covering provider during after hours 7P-7A, please log into the web site [email or web address] and access using universal [place] password for that web site. If you do not have the password, please call the hospital operator.  12/30/2020, 10:05 AM

## 2020-12-30 NOTE — Progress Notes (Signed)
Physical Therapy Treatment Patient Details Name: Jillian Leblanc MRN: [id number] DOB: May 31, 1945 Today's Date: 12/30/2020   History of Present Illness 75 y/o female admitted 11/25 with severe sepsis with UTI and possible L5-S1 abscess/osteo. Pt with Afib with RVR, hypotension and AMS with transfer to ICU 11/27. Not currently sx candidate. PMHx:  DM, PPM, neuropathy, falls, L5 fx Oct '22    PT Comments    Pt limited by anxiety, fatigue, and reports of L hip pain. Pt reports a significant fear of falling at this time, PT provides encouragement to participate in transfer training. Pt is at a high risk for falls due to LE weakness and imbalance. Pt will benefit from continued gradual mobilization to aide in improving confidence in mobility and to reduce falls risk. PT continues to recommend Snf placement at this time.   Recommendations for follow up therapy are one component of a multi-disciplinary discharge planning process, led by the attending physician.  Recommendations may be updated based on patient status, additional functional criteria and insurance authorization.  Follow Up Recommendations  Skilled nursing-short term rehab (<3 hours/day)     Assistance Recommended at Discharge Frequent or constant Supervision/Assistance  Equipment Recommendations  Wheelchair (measurements PT);Wheelchair cushion (measurements PT);Hospital bed (hoyer lift)    Recommendations for Other Services       Precautions / Restrictions Precautions Precautions: Fall;Back Precaution Comments: Back for comfort Restrictions Weight Bearing Restrictions: No     Mobility  Bed Mobility Overal bed mobility: Needs Assistance Bed Mobility: Supine to Sit;Sit to Supine     Supine to sit: Max assist;HOB elevated Sit to supine: Total assist        Transfers Overall transfer level: Needs assistance Equipment used: 1 person hand held assist Transfers: Sit to/from Stand Sit to Stand: Total assist            General transfer comment: completed 75% of sit to stand, pt aborts due to L hip pain    Ambulation/Gait                   Stairs             Wheelchair Mobility    Modified Rankin (Stroke Patients Only)       Balance Overall balance assessment: Needs assistance Sitting-balance support: Bilateral upper extremity supported;Feet supported Sitting balance-Leahy Scale: Poor Sitting balance - Comments: minG, posterior lean Postural control: Posterior lean Standing balance support: Bilateral upper extremity supported Standing balance-Leahy Scale: Poor Standing balance comment: maxA BUE support                            Cognition Arousal/Alertness: Awake/alert Behavior During Therapy: Anxious Overall Cognitive Status: Impaired/Different from baseline Area of Impairment: Memory;Following commands;Safety/judgement;Awareness;Problem solving;Attention                   Current Attention Level: Sustained Memory: Decreased recall of precautions;Decreased short-term memory Following Commands: Follows one step commands with increased time Safety/Judgement: Decreased awareness of safety;Decreased awareness of deficits Awareness: Emergent   General Comments: pt reports having a fall yesterday although not documented in chart. Pt also reports seeing a girl in the window, may be having hallucinations.        Exercises General Exercises - Lower Extremity Ankle Circles/Pumps: AROM;Both;10 reps Gluteal Sets: AROM;Both;10 reps Long Arc Quad: AROM;Both;10 reps Hip ABduction/ADduction: AROM;Both;10 reps;Seated    General Comments General comments (skin integrity, edema, etc.): VSS on 1L [place]  Pertinent Vitals/Pain Pain Assessment: Faces Faces Pain Scale: Hurts even more Pain Location: L elbow and low back Pain Descriptors / Indicators: Grimacing Pain Intervention(s): Monitored during session    Home Living                           Prior Function            PT Goals (current goals can now be found in the care plan section) Acute Rehab PT Goals Patient Stated Goal: To feel better Progress towards PT goals: Not progressing toward goals - comment    Frequency    Min 2X/week      PT Plan Current plan remains appropriate    Co-evaluation              AM-PAC PT "6 Clicks" Mobility   Outcome Measure  Help needed turning from your back to your side while in a flat bed without using bedrails?: A Lot Help needed moving from lying on your back to sitting on the side of a flat bed without using bedrails?: A Lot Help needed moving to and from a bed to a chair (including a wheelchair)?: Total Help needed standing up from a chair using your arms (e.g., wheelchair or bedside chair)?: Total Help needed to walk in hospital room?: Total Help needed climbing 3-5 steps with a railing? : Total 6 Click Score: 8    End of Session Equipment Utilized During Treatment: Oxygen Activity Tolerance: Patient limited by pain;Patient limited by fatigue Patient left: in bed;with call bell/phone within reach;with bed alarm set Nurse Communication: Mobility status PT Visit Diagnosis: Other abnormalities of gait and mobility (R26.89);Muscle weakness (generalized) (M62.81) Pain - Right/Left: Right Pain - part of body: Hip     Time: [id number] PT Time Calculation (min) (ACUTE ONLY): 35 min  Charges:  $Therapeutic Exercise: 8-22 mins $Therapeutic Activity: 8-22 mins                     Zenaida Niece, PT, DPT Acute Rehabilitation Pager: [phone] Office [phone]    Zenaida Niece 12/30/2020, 3:04 PM

## 2020-12-31 DIAGNOSIS — E871 Hypo-osmolality and hyponatremia: Secondary | ICD-10-CM | POA: Diagnosis not present

## 2020-12-31 DIAGNOSIS — G9341 Metabolic encephalopathy: Secondary | ICD-10-CM | POA: Diagnosis not present

## 2020-12-31 DIAGNOSIS — N179 Acute kidney failure, unspecified: Secondary | ICD-10-CM | POA: Diagnosis not present

## 2020-12-31 DIAGNOSIS — R7881 Bacteremia: Secondary | ICD-10-CM | POA: Diagnosis not present

## 2020-12-31 LAB — RESP PANEL BY RT-PCR (FLU A&B, COVID) ARPGX2
Influenza A by PCR: NEGATIVE
Influenza B by PCR: NEGATIVE
SARS Coronavirus 2 by RT PCR: NEGATIVE

## 2020-12-31 LAB — CBC
HCT: 24.4 % — ABNORMAL LOW (ref 36.0–46.0)
Hemoglobin: 7.8 g/dL — ABNORMAL LOW (ref 12.0–15.0)
MCH: 29.3 pg (ref 26.0–34.0)
MCHC: 32 g/dL (ref 30.0–36.0)
MCV: 91.7 fL (ref 80.0–100.0)
Platelets: 222 10*3/uL (ref 150–400)
RBC: 2.66 MIL/uL — ABNORMAL LOW (ref 3.87–5.11)
RDW: 16.3 % — ABNORMAL HIGH (ref 11.5–15.5)
WBC: 6.6 10*3/uL (ref 4.0–10.5)
nRBC: 0 % (ref 0.0–0.2)

## 2020-12-31 LAB — BASIC METABOLIC PANEL
Anion gap: 9 (ref 5–15)
BUN: 14 mg/dL (ref 8–23)
CO2: 29 mmol/L (ref 22–32)
Calcium: 7.9 mg/dL — ABNORMAL LOW (ref 8.9–10.3)
Chloride: 88 mmol/L — ABNORMAL LOW (ref 98–111)
Creatinine, Ser: 0.92 mg/dL (ref 0.44–1.00)
GFR, Estimated: >60 mL/min (ref >60)
Glucose, Bld: 123 mg/dL — ABNORMAL HIGH (ref 70–99)
Potassium: 4 mmol/L (ref 3.5–5.1)
Sodium: 126 mmol/L — ABNORMAL LOW (ref 135–145)

## 2020-12-31 LAB — GLUCOSE, CAPILLARY
Glucose-Capillary: 111 mg/dL — ABNORMAL HIGH (ref 70–99)
Glucose-Capillary: 125 mg/dL — ABNORMAL HIGH (ref 70–99)
Glucose-Capillary: 149 mg/dL — ABNORMAL HIGH (ref 70–99)
Glucose-Capillary: 153 mg/dL — ABNORMAL HIGH (ref 70–99)
Glucose-Capillary: 154 mg/dL — ABNORMAL HIGH (ref 70–99)
Glucose-Capillary: 177 mg/dL — ABNORMAL HIGH (ref 70–99)

## 2020-12-31 LAB — OSMOLALITY, URINE: Osmolality, Ur: 222 mOsm/kg — ABNORMAL LOW (ref 300–900)

## 2020-12-31 NOTE — Progress Notes (Signed)
PROGRESS NOTE        PATIENT DETAILS Name: Jillian Leblanc Age: 75 y.o. Sex: female Date of Birth: 1946-01-08 Admit Date: 12/18/2020 Admitting Physician Rhetta Mura, DO [id number], [name], MD  Brief Narrative: [name] a 75 y.o. 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  She was ultimately found to have E. coli bacteremia with vertebral osteomyelitis involving L4-S1  Subjective: Continues to have some pain in the left forearm area-swelling in that area has markedly decreased.  Per patient-she started having pain in that left forearm area even prior to this hospitalization.  Objective: Vitals: Blood pressure 117/75, pulse 72, temperature 98 F (36.7 C), temperature source Oral, resp. rate 16, height 5' (1.524 m), weight 87.3 kg, SpO2 96 %.   Exam: Gen Exam:Alert awake-not in any distress.  Looks chronically sick appearing. HEENT:atraumatic, normocephalic Chest: B/L clear to auscultation anteriorly CVS:S1S2 regular Abdomen:soft non tender, non distended Extremities:no edema Neurology: Non focal Skin: no rash   Pertinent Labs/Radiology: Recent Labs  Lab 12/31/20 0222  WBC 6.6  HGB 7.8*  PLT 222  NA 126*  K 4.0  CREATININE 0.92      Assessment/Plan: Septic shock due to E. 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  Has been afebrile since then-unclear if this was a accurate reading.  Awaiting repeat blood cultures-UA on 12/7 does not suggest UTI.  Continue IV Ancef x6 weeks per ID recommendations.Per neurosurgery-since no obvious weakness on exam-does not require surgery.  Acute  metabolic encephalopathy: Due to septic shock/AKI/hospital delirium-mental status is markedly improved.  Continue to maintain delirium precautions.    Acute hypoxic respiratory failure: Multifactorial etiology-due to asthma exacerbation and pulmonary edema.  Continues to clinically improve-has developed some mild third spacing/volume overload but responding well to diuretics.  No longer on steroids.    PAF with RVR: Maintaining sinus rhythm-initially required amiodarone-reviewed cardiology note-anticoagulation issue to be addressed upon outpatient follow-up.  Thrombocytopenia: Resolved-likely due to sepsis.  Anemia: Multifactorial etiology-but mostly due to acute illness-no evidence of blood loss-follow closely and transfuse if Hb <7.  Hypokalemia: Repleted.  Hyponatremia: Continues to have mild but asymptomatic hyponatremia-unchanged compared to yesterday.  Volume status improved after starting diuretics-encourage oral intake-reassess on 12/9.  HFpEF: Volume status improved-continue Lasix and follow.  DM-2 (A1c 8.27 October 2020): CBGs stable-continue Levemir 8 units daily and SSI-follow and adjust.  Watch closely as steroids discontinued on 12/7.  Recent Labs    12/31/20 0021 12/31/20 0412 12/31/20 0803  GLUCAP 153* 111* 125*     Hypothyroidism: Continue Synthroid-both TSH/free T4 elevated on recent labs-likely due to sick euthyroid syndrome-recommendations are to repeat thyroid function test in a few weeks.  GERD: Continue PPI  Dysphagia: Tolerating dysphagia 3 diet-I suspect this is from deconditioning.  SLP following.  Back pain: Due to discitis-prior compression fractures.  Continue with as needed narcotics.  Left elbow pain: Unclear etiology-ongoing even prior to this hospitalization-swelling has markedly improved-we will continue supportive care and reassess in a few days.  Morbid Obesity Estimated body mass index is 37.59 kg/m as calculated from the  following:   Height  as of this encounter: 5' (1.524 m).   Weight as of this encounter: 87.3 kg.    Procedures: None Consults: DM, ID, neurosurgery DVT Prophylaxis: Heparin Code Status:Full code  Family Communication: None at bedside  Time spent: 25 minutes-Greater than 50% of this time was spent in counseling, explanation of diagnosis, planning of further management, and coordination of care.   Disposition Plan: Status is: Inpatient  Remains inpatient appropriate because: Bacteremia-epidural abscess-lumbar osteomyelitis-febrile-hyponatremia-not yet stable for discharge.    Diet: Diet Order             Diet regular Room service appropriate? Yes with Assist; Fluid consistency: Thin  Diet effective now                     Antimicrobial agents: Anti-infectives (From admission, onward)    Start     Dose/Rate Route Frequency Ordered Stop   12/21/20 1800  ceFAZolin (ANCEF) IVPB 2g/100 mL premix        2 g 200 mL/hr over 30 Minutes Intravenous Every 8 hours 12/21/20 1216     12/20/20 1800  cefTRIAXone (ROCEPHIN) 2 g in sodium chloride 0.9 % 100 mL IVPB  Status:  Discontinued        2 g 200 mL/hr over 30 Minutes Intravenous Every 24 hours 12/20/20 0920 12/21/20 1216   12/20/20 0100  meropenem (MERREM) 1 g in sodium chloride 0.9 % 100 mL IVPB  Status:  Discontinued        1 g 200 mL/hr over 30 Minutes Intravenous Every 8 hours 12/20/20 0004 12/20/20 0920   12/19/20 2000  ceFEPIme (MAXIPIME) 2 g in sodium chloride 0.9 % 100 mL IVPB  Status:  Discontinued        2 g 200 mL/hr over 30 Minutes Intravenous Every 24 hours 12/19/20 0057 12/19/20 1023   12/19/20 2000  cefTRIAXone (ROCEPHIN) 2 g in sodium chloride 0.9 % 100 mL IVPB  Status:  Discontinued        2 g 200 mL/hr over 30 Minutes Intravenous Every 24 hours 12/19/20 1023 12/20/20 0004   12/19/20 0057  vancomycin variable dose per unstable renal function (pharmacist dosing)  Status:  Discontinued         Does not apply See admin instructions  12/19/20 0057 12/19/20 1023   12/18/20 2000  ceFEPIme (MAXIPIME) 2 g in sodium chloride 0.9 % 100 mL IVPB        2 g 200 mL/hr over 30 Minutes Intravenous  Once 12/18/20 1955 12/18/20 2050   12/18/20 2000  metroNIDAZOLE (FLAGYL) IVPB 500 mg        500 mg 100 mL/hr over 60 Minutes Intravenous  Once 12/18/20 1955 12/18/20 2210   12/18/20 2000  vancomycin (VANCOREADY) IVPB 1750 mg/350 mL        1,750 mg 175 mL/hr over 120 Minutes Intravenous  Once 12/18/20 1955 12/19/20 0045        MEDICATIONS: Scheduled Meds:  acetaminophen  650 mg Oral Once   amitriptyline  100 mg Oral QHS   arformoterol  15 mcg Nebulization BID   calcium carbonate  800 mg of elemental calcium Oral BID   calcium-vitamin D  0.5 tablet Oral BID   Chlorhexidine Gluconate Cloth  6 each Topical Daily   fentaNYL (SUBLIMAZE) injection  100 mcg Intravenous Once   fluticasone  1 spray Each Nare Daily   furosemide  40 mg Oral Daily   gabapentin  300 mg Oral BID  heparin injection (subcutaneous)  5,000 Units Subcutaneous Q8H   insulin aspart  0-15 Units Subcutaneous Q4H   insulin detemir  8 Units Subcutaneous Daily   levothyroxine  150 mcg Oral Q0600   lidocaine  1 patch Transdermal Daily   loratadine  10 mg Oral Daily   mouth rinse  15 mL Mouth Rinse BID   pantoprazole  40 mg Oral Daily   polyethylene glycol  17 g Oral Daily   pramipexole  1.5 mg Oral TID   revefenacin  175 mcg Nebulization Daily   sodium chloride flush  10-40 mL Intracatheter Q12H   Continuous Infusions:  sodium chloride Stopped (12/21/20 1240)   sodium chloride      ceFAZolin (ANCEF) IV Stopped (12/31/20 0953)   PRN Meds:.sodium chloride, acetaminophen **OR** acetaminophen, benzonatate, dextromethorphan, docusate sodium, fentaNYL (SUBLIMAZE) injection, guaiFENesin, hydrOXYzine, ipratropium-albuterol, lip balm, methocarbamol, naLOXone (NARCAN)  injection, oxyCODONE, phenol, sodium chloride flush   I have personally reviewed following labs and  imaging studies  LABORATORY DATA: CBC: Recent Labs  Lab 12/25/20 1836 12/27/20 0356 12/28/20 0330 12/30/20 0609 12/31/20 0222  WBC 8.9 9.4 6.2 7.7 6.6  NEUTROABS 8.2*  --   --   --   --   HGB 9.0* 8.4* 8.3* 7.6* 7.8*  HCT 26.8* 25.0* 24.9* 23.4* 24.4*  MCV 85.9 86.5 89.2 88.3 91.7  PLT 237 265 266 251 222     Basic Metabolic Panel: Recent Labs  Lab 12/25/20 1836 12/26/20 0600 12/27/20 0356 12/28/20 0330 12/30/20 0609 12/31/20 0222  NA 132*  --  129* 130* 126* 126*  K 4.5 4.2 3.8 3.8 3.2* 4.0  CL 97*  --  91* 90* 86* 88*  CO2 31  --  30 34* 31 29  GLUCOSE 192*  --  143* 156* 121* 123*  BUN 21  --  12 12 14 14   CREATININE 0.89  --  0.86 0.80 0.87 0.92  CALCIUM 8.5*  --  8.3* 8.0* 7.9* 7.9*     GFR: Estimated Creatinine Clearance: 53.5 mL/min (by C-G formula based on SCr of 0.92 mg/dL).  Liver Function Tests: No results for input(s): AST, ALT, ALKPHOS, BILITOT, PROT, ALBUMIN in the last 168 hours. No results for input(s): LIPASE, AMYLASE in the last 168 hours. No results for input(s): AMMONIA in the last 168 hours.  Coagulation Profile: No results for input(s): INR, PROTIME in the last 168 hours.  Cardiac Enzymes: No results for input(s): CKTOTAL, CKMB, CKMBINDEX, TROPONINI in the last 168 hours.  BNP (last 3 results) No results for input(s): PROBNP in the last 8760 hours.  Lipid Profile: No results for input(s): CHOL, HDL, LDLCALC, TRIG, CHOLHDL, LDLDIRECT in the last 72 hours.  Thyroid Function Tests: No results for input(s): TSH, T4TOTAL, FREET4, T3FREE, THYROIDAB in the last 72 hours.  Anemia Panel: No results for input(s): VITAMINB12, FOLATE, FERRITIN, TIBC, IRON, RETICCTPCT in the last 72 hours.  Urine analysis:    Component Value Date/Time   COLORURINE YELLOW 12/30/2020 1022   APPEARANCEUR CLEAR 12/30/2020 1022   LABSPEC <1.005 (L) 12/30/2020 1022   PHURINE 7.0 12/30/2020 1022   GLUCOSEU NEGATIVE 12/30/2020 1022   HGBUR MODERATE (A)  12/30/2020 1022   BILIRUBINUR NEGATIVE 12/30/2020 1022   KETONESUR NEGATIVE 12/30/2020 1022   PROTEINUR NEGATIVE 12/30/2020 1022   UROBILINOGEN 0.2 09/15/2012 1536   NITRITE NEGATIVE 12/30/2020 1022   LEUKOCYTESUR MODERATE (A) 12/30/2020 1022    Sepsis Labs: Lactic Acid, Venous    Component Value Date/Time   LATICACIDVEN 1.5  12/20/2020 0429    MICROBIOLOGY: Recent Results (from the past 240 hour(s))  Resp Panel by RT-PCR (Flu A&B, Covid) Nasopharyngeal Swab     Status: None   Collection Time: 12/29/20  3:29 PM   Specimen: Nasopharyngeal Swab; Nasopharyngeal(NP) swabs in vial transport medium  Result Value Ref Range Status   SARS Coronavirus 2 by RT PCR NEGATIVE NEGATIVE Final    Comment: (NOTE) SARS-CoV-2 target nucleic acids are NOT DETECTED.  The SARS-CoV-2 RNA is generally detectable in upper respiratory specimens during the acute phase of infection. The lowest concentration of SARS-CoV-2 viral copies this assay can detect is 138 copies/mL. A negative result does not preclude SARS-Cov-2 infection and should not be used as the sole basis for treatment or other patient management decisions. A negative result may occur with  improper specimen collection/handling, submission of specimen other than nasopharyngeal swab, presence of viral mutation(s) within the areas targeted by this assay, and inadequate number of viral copies(<138 copies/mL). A negative result must be combined with clinical observations, patient history, and epidemiological information. The expected result is Negative.  Fact Sheet for Patients:  BloggerCourse.com  Fact Sheet for Healthcare Providers:  SeriousBroker.it  This test is no t yet approved or cleared by the [place] FDA and  has been authorized for detection and/or diagnosis of SARS-CoV-2 by FDA under an Emergency Use Authorization (EUA). This EUA will remain  in effect (meaning this test  can be used) for the duration of the COVID-19 declaration under Section 564(b)(1) of the Act, 21 U.S.C.section 360bbb-3(b)(1), unless the authorization is terminated  or revoked sooner.       Influenza A by PCR NEGATIVE NEGATIVE Final   Influenza B by PCR NEGATIVE NEGATIVE Final    Comment: (NOTE) The Xpert Xpress SARS-CoV-2/FLU/RSV plus assay is intended as an aid in the diagnosis of influenza from Nasopharyngeal swab specimens and should not be used as a sole basis for treatment. Nasal washings and aspirates are unacceptable for Xpert Xpress SARS-CoV-2/FLU/RSV testing.  Fact Sheet for Patients: BloggerCourse.com  Fact Sheet for Healthcare Providers: SeriousBroker.it  This test is not yet approved or cleared by the [place] FDA and has been authorized for detection and/or diagnosis of SARS-CoV-2 by FDA under an Emergency Use Authorization (EUA). This EUA will remain in effect (meaning this test can be used) for the duration of the COVID-19 declaration under Section 564(b)(1) of the Act, 21 U.S.C. section 360bbb-3(b)(1), unless the authorization is terminated or revoked.  Performed at [facility], [address], [place], [place] [address]     RADIOLOGY STUDIES/RESULTS: DG Elbow 2 Views Left  Result Date: 12/30/2020 CLINICAL DATA:  Left elbow pain and swelling. EXAM: LEFT ELBOW - 2 VIEW COMPARISON:  None. FINDINGS: There is no evidence of fracture, dislocation, or joint effusion. There is no evidence of arthropathy or other focal bone abnormality. Soft tissues are unremarkable. IMPRESSION: Negative. Electronically Signed   By: Lupita Raider M.D.   On: 12/30/2020 11:01     LOS: 13 days   Jeoffrey Massed, MD  [facility]    To contact the attending provider between 7A-7P or the covering provider during after hours 7P-7A, please log into the web site [email or web address] and access using universal [place]  password for that web site. If you do not have the password, please call the hospital operator.  12/31/2020, 10:23 AM

## 2021-01-01 ENCOUNTER — Inpatient Hospital Stay (HOSPITAL_COMMUNITY): Payer: Medicare Other

## 2021-01-01 DIAGNOSIS — E871 Hypo-osmolality and hyponatremia: Secondary | ICD-10-CM | POA: Diagnosis not present

## 2021-01-01 DIAGNOSIS — G9341 Metabolic encephalopathy: Secondary | ICD-10-CM | POA: Diagnosis not present

## 2021-01-01 DIAGNOSIS — R7881 Bacteremia: Secondary | ICD-10-CM | POA: Diagnosis not present

## 2021-01-01 DIAGNOSIS — N179 Acute kidney failure, unspecified: Secondary | ICD-10-CM | POA: Diagnosis not present

## 2021-01-01 DIAGNOSIS — R609 Edema, unspecified: Secondary | ICD-10-CM | POA: Diagnosis not present

## 2021-01-01 LAB — BASIC METABOLIC PANEL
Anion gap: 12 (ref 5–15)
BUN: 12 mg/dL (ref 8–23)
CO2: 27 mmol/L (ref 22–32)
Calcium: 7.6 mg/dL — ABNORMAL LOW (ref 8.9–10.3)
Chloride: 85 mmol/L — ABNORMAL LOW (ref 98–111)
Creatinine, Ser: 1.14 mg/dL — ABNORMAL HIGH (ref 0.44–1.00)
GFR, Estimated: 51 mL/min — ABNORMAL LOW (ref >60)
Glucose, Bld: 117 mg/dL — ABNORMAL HIGH (ref 70–99)
Potassium: 3.3 mmol/L — ABNORMAL LOW (ref 3.5–5.1)
Sodium: 124 mmol/L — ABNORMAL LOW (ref 135–145)

## 2021-01-01 LAB — GLUCOSE, CAPILLARY
Glucose-Capillary: 118 mg/dL — ABNORMAL HIGH (ref 70–99)
Glucose-Capillary: 122 mg/dL — ABNORMAL HIGH (ref 70–99)
Glucose-Capillary: 125 mg/dL — ABNORMAL HIGH (ref 70–99)
Glucose-Capillary: 125 mg/dL — ABNORMAL HIGH (ref 70–99)
Glucose-Capillary: 144 mg/dL — ABNORMAL HIGH (ref 70–99)
Glucose-Capillary: 268 mg/dL — ABNORMAL HIGH (ref 70–99)

## 2021-01-01 LAB — OSMOLALITY: Osmolality: 262 mOsm/kg — ABNORMAL LOW (ref 275–295)

## 2021-01-01 MED ORDER — POTASSIUM CHLORIDE CRYS ER 20 MEQ PO TBCR
40.0000 meq | EXTENDED_RELEASE_TABLET | Freq: Once | ORAL | Status: AC
  Administered 2021-01-01: 40 meq via ORAL
  Filled 2021-01-01: qty 2

## 2021-01-01 MED ORDER — SODIUM CHLORIDE 0.9 % IV SOLN: INTRAVENOUS | Status: AC

## 2021-01-01 NOTE — Progress Notes (Signed)
PROGRESS NOTE        PATIENT DETAILS Name: Jillian Leblanc Age: 75 y.o. Sex: female Date of Birth: 01-14-1946 Admit Date: 12/18/2020 Admitting Physician Rhetta Mura, DO [id number], [name], MD  Brief Narrative: [name] a 75 y.o. 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  She was ultimately found to have E. coli bacteremia with vertebral osteomyelitis involving L4-S1  Subjective: No major issues overnight-continues to have issues with left elbow pain (tells me this has been going on for 2 months).  Per nursing staff-Limited oral intake because of pain in the left elbow and not being able to use arms to eat  Objective: Vitals: Blood pressure (!) 117/52, pulse 74, temperature (!) 97.5 F (36.4 C), temperature source Oral, resp. rate 20, height 5' (1.524 m), weight 88.4 kg, SpO2 95 %.   Exam: Gen Exam:Alert awake-not in any distress.  Looks chronically ill-appearing. HEENT:atraumatic, normocephalic Chest: B/L clear to auscultation anteriorly CVS:S1S2 regular Abdomen:soft non tender, non distended Extremities:no edema Neurology: Non focal Skin: no rash   Pertinent Labs/Radiology: Recent Labs  Lab 12/31/20 0222 01/01/21 0243  WBC 6.6  --   HGB 7.8*  --   PLT 222  --   NA 126* 124*  K 4.0 3.3*  CREATININE 0.92 1.14*      Assessment/Plan: Septic shock due to E. 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  Repeat UA not suggestive of UTI-repeat blood cultures on 12/7 and negative so far. Continue IV Ancef x6 weeks per ID recommendations.Per neurosurgery-since no obvious  weakness on exam-does not require surgery.  Continues to have left elbow pain-CT/Doppler ordered-see below.  Acute metabolic encephalopathy: Due to septic shock/AKI/hospital delirium-mental status is markedly improved.  Continue to maintain delirium precautions.    Acute hypoxic respiratory failure: Multifactorial etiology-due to asthma exacerbation and pulmonary edema.  Continues to clinically improve-with supportive care and diuretic regimen-on just 1 L of oxygen.  No longer on steroids.      PAF with RVR: Maintaining sinus rhythm-initially required amiodarone-reviewed cardiology note-anticoagulation issue to be addressed upon outpatient follow-up.  Thrombocytopenia: Resolved-likely due to sepsis.  Anemia: Multifactorial etiology-but mostly due to acute illness-no evidence of blood loss-follow closely and transfuse if Hb <7.  Hypokalemia: Replete and recheck.  Hyponatremia: Has worsening hyponatremia-volume status hard to assess but clearly much better after starting diuretic regimen-does not have any overt evidence of volume overload.  Since oral intake is poor-weight continues to decrease-due to worsening hyponatremia-stop Lasix and start IVF.  Follow electrolytes closely.    HFpEF: Volume status improved-continue Lasix and follow.  DM-2 (A1c 8.27 October 2020): CBGs stable-continue Levemir 8 units daily and SSI-follow and adjust.  Watch closely as steroids discontinued on 12/7.  Recent Labs    01/01/21 0410 01/01/21 0741 01/01/21 1220  GLUCAP 125* 125* 122*     Hypothyroidism: Continue Synthroid-both TSH/free T4 elevated on recent labs-likely due to sick euthyroid syndrome-recommendations are to repeat thyroid function test in a few weeks.  GERD: Continue PPI  Dysphagia: Tolerating dysphagia 3 diet-I suspect this is from deconditioning.  SLP following.  Back pain: Due to discitis-prior compression fractures.  Continue with as needed narcotics.  Left elbow pain: Unclear  etiology-x-rays negative for any significant abnormality-no erythema swelling that she had several days ago has significantly improved.  She tells me today this has been going on for almost 2 months-we will order CT and upper extremity Doppler.  Morbid Obesity Estimated body mass index is 38.06 kg/m as calculated from the following:   Height as of this encounter: 5' (1.524 m).   Weight as of this encounter: 88.4 kg.    Procedures: None Consults: DM, ID, neurosurgery DVT Prophylaxis: Heparin Code Status:Full code  Family Communication: None at bedside  Time spent: 25 minutes-Greater than 50% of this time was spent in counseling, explanation of diagnosis, planning of further management, and coordination of care.   Disposition Plan: Status is: Inpatient  Remains inpatient appropriate because: Bacteremia-epidural abscess-lumbar osteomyelitis-febrile-hyponatremia-not yet stable for discharge.    Diet: Diet Order             Diet regular Room service appropriate? Yes with Assist; Fluid consistency: Thin  Diet effective now                     Antimicrobial agents: Anti-infectives (From admission, onward)    Start     Dose/Rate Route Frequency Ordered Stop   12/21/20 1800  ceFAZolin (ANCEF) IVPB 2g/100 mL premix        2 g 200 mL/hr over 30 Minutes Intravenous Every 8 hours 12/21/20 1216     12/20/20 1800  cefTRIAXone (ROCEPHIN) 2 g in sodium chloride 0.9 % 100 mL IVPB  Status:  Discontinued        2 g 200 mL/hr over 30 Minutes Intravenous Every 24 hours 12/20/20 0920 12/21/20 1216   12/20/20 0100  meropenem (MERREM) 1 g in sodium chloride 0.9 % 100 mL IVPB  Status:  Discontinued        1 g 200 mL/hr over 30 Minutes Intravenous Every 8 hours 12/20/20 0004 12/20/20 0920   12/19/20 2000  ceFEPIme (MAXIPIME) 2 g in sodium chloride 0.9 % 100 mL IVPB  Status:  Discontinued        2 g 200 mL/hr over 30 Minutes Intravenous Every 24 hours 12/19/20 0057 12/19/20 1023    12/19/20 2000  cefTRIAXone (ROCEPHIN) 2 g in sodium chloride 0.9 % 100 mL IVPB  Status:  Discontinued        2 g 200 mL/hr over 30 Minutes Intravenous Every 24 hours 12/19/20 1023 12/20/20 0004   12/19/20 0057  vancomycin variable dose per unstable renal function (pharmacist dosing)  Status:  Discontinued         Does not apply See admin instructions 12/19/20 0057 12/19/20 1023   12/18/20 2000  ceFEPIme (MAXIPIME) 2 g in sodium chloride 0.9 % 100 mL IVPB        2 g 200 mL/hr over 30 Minutes Intravenous  Once 12/18/20 1955 12/18/20 2050   12/18/20 2000  metroNIDAZOLE (FLAGYL) IVPB 500 mg        500 mg 100 mL/hr over 60 Minutes Intravenous  Once 12/18/20 1955 12/18/20 2210   12/18/20 2000  vancomycin (VANCOREADY) IVPB 1750 mg/350 mL        1,750 mg 175 mL/hr over 120 Minutes Intravenous  Once 12/18/20 1955 12/19/20 0045        MEDICATIONS: Scheduled Meds:  acetaminophen  650 mg Oral Once   amitriptyline  100 mg Oral QHS  arformoterol  15 mcg Nebulization BID   calcium carbonate  800 mg of elemental calcium Oral BID   calcium-vitamin D  0.5 tablet Oral BID   Chlorhexidine Gluconate Cloth  6 each Topical Daily   fentaNYL (SUBLIMAZE) injection  100 mcg Intravenous Once   fluticasone  1 spray Each Nare Daily   gabapentin  300 mg Oral BID   heparin injection (subcutaneous)  5,000 Units Subcutaneous Q8H   insulin aspart  0-15 Units Subcutaneous Q4H   insulin detemir  8 Units Subcutaneous Daily   levothyroxine  150 mcg Oral Q0600   lidocaine  1 patch Transdermal Daily   loratadine  10 mg Oral Daily   mouth rinse  15 mL Mouth Rinse BID   pantoprazole  40 mg Oral Daily   polyethylene glycol  17 g Oral Daily   pramipexole  1.5 mg Oral TID   revefenacin  175 mcg Nebulization Daily   sodium chloride flush  10-40 mL Intracatheter Q12H   Continuous Infusions:  sodium chloride Stopped (12/21/20 1240)   sodium chloride     sodium chloride 75 mL/hr at 01/01/21 0756    ceFAZolin (ANCEF)  IV Stopped (01/01/21 0907)   PRN Meds:.sodium chloride, acetaminophen **OR** acetaminophen, benzonatate, dextromethorphan, docusate sodium, fentaNYL (SUBLIMAZE) injection, guaiFENesin, hydrOXYzine, ipratropium-albuterol, lip balm, methocarbamol, naLOXone (NARCAN)  injection, oxyCODONE, phenol, sodium chloride flush   I have personally reviewed following labs and imaging studies  LABORATORY DATA: CBC: Recent Labs  Lab 12/25/20 1836 12/27/20 0356 12/28/20 0330 12/30/20 0609 12/31/20 0222  WBC 8.9 9.4 6.2 7.7 6.6  NEUTROABS 8.2*  --   --   --   --   HGB 9.0* 8.4* 8.3* 7.6* 7.8*  HCT 26.8* 25.0* 24.9* 23.4* 24.4*  MCV 85.9 86.5 89.2 88.3 91.7  PLT 237 265 266 251 222     Basic Metabolic Panel: Recent Labs  Lab 12/27/20 0356 12/28/20 0330 12/30/20 0609 12/31/20 0222 01/01/21 0243  NA 129* 130* 126* 126* 124*  K 3.8 3.8 3.2* 4.0 3.3*  CL 91* 90* 86* 88* 85*  CO2 30 34* 31 29 27   GLUCOSE 143* 156* 121* 123* 117*  BUN 12 12 14 14 12   CREATININE 0.86 0.80 0.87 0.92 1.14*  CALCIUM 8.3* 8.0* 7.9* 7.9* 7.6*     GFR: Estimated Creatinine Clearance: 43.5 mL/min (A) (by C-G formula based on SCr of 1.14 mg/dL (H)).  Liver Function Tests: No results for input(s): AST, ALT, ALKPHOS, BILITOT, PROT, ALBUMIN in the last 168 hours. No results for input(s): LIPASE, AMYLASE in the last 168 hours. No results for input(s): AMMONIA in the last 168 hours.  Coagulation Profile: No results for input(s): INR, PROTIME in the last 168 hours.  Cardiac Enzymes: No results for input(s): CKTOTAL, CKMB, CKMBINDEX, TROPONINI in the last 168 hours.  BNP (last 3 results) No results for input(s): PROBNP in the last 8760 hours.  Lipid Profile: No results for input(s): CHOL, HDL, LDLCALC, TRIG, CHOLHDL, LDLDIRECT in the last 72 hours.  Thyroid Function Tests: No results for input(s): TSH, T4TOTAL, FREET4, T3FREE, THYROIDAB in the last 72 hours.  Anemia Panel: No results for input(s):  VITAMINB12, FOLATE, FERRITIN, TIBC, IRON, RETICCTPCT in the last 72 hours.  Urine analysis:    Component Value Date/Time   COLORURINE YELLOW 12/30/2020 1022   APPEARANCEUR CLEAR 12/30/2020 1022   LABSPEC <1.005 (L) 12/30/2020 1022   PHURINE 7.0 12/30/2020 1022   GLUCOSEU NEGATIVE 12/30/2020 1022   HGBUR MODERATE (A) 12/30/2020 1022  BILIRUBINUR NEGATIVE 12/30/2020 1022   KETONESUR NEGATIVE 12/30/2020 1022   PROTEINUR NEGATIVE 12/30/2020 1022   UROBILINOGEN 0.2 09/15/2012 1536   NITRITE NEGATIVE 12/30/2020 1022   LEUKOCYTESUR MODERATE (A) 12/30/2020 1022    Sepsis Labs: Lactic Acid, Venous    Component Value Date/Time   LATICACIDVEN 1.5 12/20/2020 0429    MICROBIOLOGY: Recent Results (from the past 240 hour(s))  Resp Panel by RT-PCR (Flu A&B, Covid) Nasopharyngeal Swab     Status: None   Collection Time: 12/29/20  3:29 PM   Specimen: Nasopharyngeal Swab; Nasopharyngeal(NP) swabs in vial transport medium  Result Value Ref Range Status   SARS Coronavirus 2 by RT PCR NEGATIVE NEGATIVE Final    Comment: (NOTE) SARS-CoV-2 target nucleic acids are NOT DETECTED.  The SARS-CoV-2 RNA is generally detectable in upper respiratory specimens during the acute phase of infection. The lowest concentration of SARS-CoV-2 viral copies this assay can detect is 138 copies/mL. A negative result does not preclude SARS-Cov-2 infection and should not be used as the sole basis for treatment or other patient management decisions. A negative result may occur with  improper specimen collection/handling, submission of specimen other than nasopharyngeal swab, presence of viral mutation(s) within the areas targeted by this assay, and inadequate number of viral copies(<138 copies/mL). A negative result must be combined with clinical observations, patient history, and epidemiological information. The expected result is Negative.  Fact Sheet for Patients:   EntrepreneurPulse.com.au  Fact Sheet for Healthcare Providers:  IncredibleEmployment.be  This test is no t yet approved or cleared by the [place] FDA and  has been authorized for detection and/or diagnosis of SARS-CoV-2 by FDA under an Emergency Use Authorization (EUA). This EUA will remain  in effect (meaning this test can be used) for the duration of the COVID-19 declaration under Section 564(b)(1) of the Act, 21 U.S.C.section 360bbb-3(b)(1), unless the authorization is terminated  or revoked sooner.       Influenza A by PCR NEGATIVE NEGATIVE Final   Influenza B by PCR NEGATIVE NEGATIVE Final    Comment: (NOTE) The Xpert Xpress SARS-CoV-2/FLU/RSV plus assay is intended as an aid in the diagnosis of influenza from Nasopharyngeal swab specimens and should not be used as a sole basis for treatment. Nasal washings and aspirates are unacceptable for Xpert Xpress SARS-CoV-2/FLU/RSV testing.  Fact Sheet for Patients: EntrepreneurPulse.com.au  Fact Sheet for Healthcare Providers: IncredibleEmployment.be  This test is not yet approved or cleared by the [place] FDA and has been authorized for detection and/or diagnosis of SARS-CoV-2 by FDA under an Emergency Use Authorization (EUA). This EUA will remain in effect (meaning this test can be used) for the duration of the COVID-19 declaration under Section 564(b)(1) of the Act, 21 U.S.C. section 360bbb-3(b)(1), unless the authorization is terminated or revoked.  Performed at [facility], [place] [address] [place], [place] [address]   Culture, blood (routine x 2)     Status: None (Preliminary result)   Collection Time: 12/30/20 11:33 AM   Specimen: BLOOD RIGHT HAND  Result Value Ref Range Status   Specimen Description BLOOD RIGHT HAND  Final   Special Requests   Final    BOTTLES DRAWN AEROBIC AND ANAEROBIC Blood Culture adequate volume    Culture   Final    NO GROWTH 2 DAYS Performed at [facility], [place] [address], [place], [place] [address]    Report Status PENDING  Incomplete  Culture, blood (routine x 2)     Status: None (  Preliminary result)   Collection Time: 12/30/20 11:43 AM   Specimen: BLOOD RIGHT HAND  Result Value Ref Range Status   Specimen Description BLOOD RIGHT HAND  Final   Special Requests   Final    BOTTLES DRAWN AEROBIC AND ANAEROBIC Blood Culture adequate volume   Culture   Final    NO GROWTH 2 DAYS Performed at [facility], [address], [place],  [address]    Report Status PENDING  Incomplete    RADIOLOGY STUDIES/RESULTS: No results found.   LOS: 14 days   Oren Binet, MD  [facility]    To contact the attending provider between 7A-7P or the covering provider during after hours 7P-7A, please log into the web site [email or web address] and access using universal [place] password for that web site. If you do not have the password, please call the hospital operator.  01/01/2021, 1:29 PM

## 2021-01-01 NOTE — TOC Progression Note (Signed)
Transition of Care [facility]) - Progression Note    Patient Details  Name: Jillian Leblanc MRN: [id number] Date of Birth: 18-Dec-1945  [facility] CM/SW Contact  Mearl Latin, LCSW Phone Number: 01/01/2021, 1:53 PM  Clinical Narrative:    CSW made [facility] aware that patient is not medically ready yet.      Barriers to Discharge: Continued Medical Work up  Expected Discharge Plan and Services   In-house Referral: Clinical Social Work   Post Acute Care Choice: Skilled Nursing Facility Living arrangements for the past 2 months: Single Family Home                                       Social Determinants of Health (SDOH) Interventions    Readmission Risk Interventions No flowsheet data found.

## 2021-01-01 NOTE — Progress Notes (Signed)
Upper extremity venous has been completed.   Preliminary results in CV Proc.   Agnieszka Newhouse Deeric Cruise 01/01/2021 3:12 PM

## 2021-01-01 NOTE — Progress Notes (Signed)
Physical Therapy Treatment Patient Details Name: Jillian Leblanc MRN: [id number] DOB: 1945-05-03 Today's Date: 01/01/2021   History of Present Illness 75 y/o female admitted 11/25 with severe sepsis with UTI and possible L5-S1 abscess/osteo. Pt with Afib with RVR, hypotension and AMS with transfer to ICU 11/27. Not currently sx candidate. PMHx:  DM, PPM, neuropathy, falls, L5 fx Oct '22    PT Comments    Pt continues to be limited with mobility due to back and hip pain in addition to weakness. Continue to recommend SNF at DC.    Recommendations for follow up therapy are one component of a multi-disciplinary discharge planning process, led by the attending physician.  Recommendations may be updated based on patient status, additional functional criteria and insurance authorization.  Follow Up Recommendations  Skilled nursing-short term rehab (<3 hours/day)     Assistance Recommended at Discharge Frequent or constant Supervision/Assistance  Equipment Recommendations  Wheelchair (measurements PT);Wheelchair cushion (measurements PT);Hospital bed;Other (comment) (hoyer lift)    Recommendations for Other Services       Precautions / Restrictions Precautions Precautions: Fall;Back Precaution Comments: Back for comfort     Mobility  Bed Mobility Overal bed mobility: Needs Assistance Bed Mobility: Rolling;Sidelying to Sit;Sit to Supine Rolling: Total assist Sidelying to sit: +2 for physical assistance;Total assist   Sit to supine: +2 for physical assistance;Total assist   General bed mobility comments: Assist for all aspects. Pt with incr pain with transitional movements    Transfers Overall transfer level: Needs assistance Equipment used: Ambulation equipment used Jillian Leblanc) Transfers: Sit to/from Stand Sit to Stand: +2 physical assistance;Max assist;Mod assist           General transfer comment: Assist to bring hips up using bed pad. Pt slow to rise due to hip/back pain.  Stood from bed with +2 max and from elevated seat of Stedy with +2 mod    Ambulation/Gait             Pre-gait activities: Stood x 2 with Stedy for ~30 sec with +2 mod assist to maintain General Gait Details: unable   Chief Strategy Officer    Modified Rankin (Stroke Patients Only)       Balance Overall balance assessment: Needs assistance Sitting-balance support: Bilateral upper extremity supported;Feet supported Sitting balance-Leahy Scale: Poor Sitting balance - Comments: UE support and min guard for static sitting Postural control: Posterior lean Standing balance support: Bilateral upper extremity supported;Reliant on assistive device for balance Standing balance-Leahy Scale: Poor Standing balance comment: Stedy and mod assist for static standing                            Cognition Arousal/Alertness: Awake/alert Behavior During Therapy: Anxious Overall Cognitive Status: Impaired/Different from baseline Area of Impairment: Attention;Problem solving;Awareness;Following commands;Memory                   Current Attention Level: Sustained Memory: Decreased short-term memory Following Commands: Follows one step commands with increased time Safety/Judgement: Decreased awareness of safety;Decreased awareness of deficits Awareness: Emergent Problem Solving: Requires verbal cues;Requires tactile cues General Comments: Pt anxious and distracted by pain.        Exercises      General Comments General comments (skin integrity, edema, etc.): VSS on 1L O2      Pertinent Vitals/Pain Pain Assessment: 0-10 Pain Score: 9  Pain Location: lt hip and back Pain Descriptors /  Indicators: Spasm;Moaning Pain Intervention(s): Limited activity within patient's tolerance;Repositioned;Patient requesting pain meds-RN notified    Home Living                          Prior Function            PT Goals (current goals can  now be found in the care plan section) Acute Rehab PT Goals Patient Stated Goal: To feel better Progress towards PT goals: Not progressing toward goals - comment (pain)    Frequency    Min 2X/week      PT Plan Current plan remains appropriate    Co-evaluation              AM-PAC PT "6 Clicks" Mobility   Outcome Measure  Help needed turning from your back to your side while in a flat bed without using bedrails?: Total Help needed moving from lying on your back to sitting on the side of a flat bed without using bedrails?: Total Help needed moving to and from a bed to a chair (including a wheelchair)?: Total Help needed standing up from a chair using your arms (e.g., wheelchair or bedside chair)?: Total Help needed to walk in hospital room?: Total Help needed climbing 3-5 steps with a railing? : Total 6 Click Score: 6    End of Session Equipment Utilized During Treatment: Oxygen Activity Tolerance: Patient limited by pain;Patient limited by fatigue Patient left: in bed;with call bell/phone within reach;with bed alarm set Nurse Communication: Mobility status;Patient requests pain meds PT Visit Diagnosis: Other abnormalities of gait and mobility (R26.89);Muscle weakness (generalized) (M62.81) Pain - Right/Left: Left Pain - part of body: Hip     Time: 6213-0865 PT Time Calculation (min) (ACUTE ONLY): 21 min  Charges:  $Therapeutic Activity: 8-22 mins                     [facility] PT Acute Rehabilitation Services Pager [phone] Office [phone]    Angelina Ok [facility] 01/01/2021, 10:31 AM

## 2021-01-02 ENCOUNTER — Inpatient Hospital Stay (HOSPITAL_COMMUNITY): Payer: Medicare Other

## 2021-01-02 DIAGNOSIS — E871 Hypo-osmolality and hyponatremia: Secondary | ICD-10-CM | POA: Diagnosis not present

## 2021-01-02 DIAGNOSIS — G9341 Metabolic encephalopathy: Secondary | ICD-10-CM | POA: Diagnosis not present

## 2021-01-02 DIAGNOSIS — R7881 Bacteremia: Secondary | ICD-10-CM | POA: Diagnosis not present

## 2021-01-02 DIAGNOSIS — N179 Acute kidney failure, unspecified: Secondary | ICD-10-CM | POA: Diagnosis not present

## 2021-01-02 LAB — BASIC METABOLIC PANEL
Anion gap: 9 (ref 5–15)
BUN: 12 mg/dL (ref 8–23)
CO2: 29 mmol/L (ref 22–32)
Calcium: 8.4 mg/dL — ABNORMAL LOW (ref 8.9–10.3)
Chloride: 87 mmol/L — ABNORMAL LOW (ref 98–111)
Creatinine, Ser: 1.05 mg/dL — ABNORMAL HIGH (ref 0.44–1.00)
GFR, Estimated: 56 mL/min — ABNORMAL LOW (ref >60)
Glucose, Bld: 136 mg/dL — ABNORMAL HIGH (ref 70–99)
Potassium: 3.9 mmol/L (ref 3.5–5.1)
Sodium: 125 mmol/L — ABNORMAL LOW (ref 135–145)

## 2021-01-02 LAB — GLUCOSE, CAPILLARY
Glucose-Capillary: 102 mg/dL — ABNORMAL HIGH (ref 70–99)
Glucose-Capillary: 133 mg/dL — ABNORMAL HIGH (ref 70–99)
Glucose-Capillary: 157 mg/dL — ABNORMAL HIGH (ref 70–99)
Glucose-Capillary: 184 mg/dL — ABNORMAL HIGH (ref 70–99)
Glucose-Capillary: 246 mg/dL — ABNORMAL HIGH (ref 70–99)
Glucose-Capillary: 86 mg/dL (ref 70–99)

## 2021-01-02 MED ORDER — SODIUM CHLORIDE 0.9 % IV SOLN: INTRAVENOUS | Status: AC

## 2021-01-02 NOTE — Progress Notes (Signed)
PROGRESS NOTE        PATIENT DETAILS Name: Jillian Leblanc Age: 75 y.o. Sex: female Date of Birth: Feb 08, 1945 Admit Date: 12/18/2020 Admitting Physician Angie Fava, DO ZOX:WRUEAV, Idelia Salm, MD  Brief Narrative: [name] a 75 y.o. 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  She was ultimately found to have E. coli bacteremia with vertebral osteomyelitis involving L4-S1  Subjective: Did not eat much yesterday-continues to have some elbow pain.  Objective: Vitals: Blood pressure (!) 149/70, pulse 74, temperature 98.5 F (36.9 C), temperature source Oral, resp. rate 17, height 5' (1.524 m), weight 88.2 kg, SpO2 98 %.   Exam: Gen Exam:Alert awake-not in any distress HEENT:atraumatic, normocephalic Chest: B/L clear to auscultation anteriorly CVS:S1S2 regular Abdomen:soft non tender, non distended Extremities:no edema Neurology: Non focal-but has generalized weakness. Skin: no rash   Pertinent Labs/Radiology: Recent Labs  Lab 12/31/20 0222 01/01/21 0243 01/02/21 0108  WBC 6.6  --   --   HGB 7.8*  --   --   PLT 222  --   --   NA 126*   < > 125*  K 4.0   < > 3.9  CREATININE 0.92   < > 1.05*   < > = values in this interval not displayed.      Assessment/Plan: Septic shock due to E. 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  Repeat UA not suggestive of UTI-repeat blood cultures on 12/7 and negative so far. Continue IV Ancef x6 weeks per ID recommendations.Per neurosurgery-since no obvious weakness on exam-does not require surgery.   Acute metabolic encephalopathy: Due  to septic shock/AKI/hospital delirium-mental status is markedly improved.  Continue to maintain delirium precautions.    Acute hypoxic respiratory failure: Multifactorial etiology-due to asthma exacerbation and pulmonary edema.  Continues to clinically improve-with supportive care and diuretic regimen-on just 1 L of oxygen.  No longer on steroids.      PAF with RVR: Maintaining sinus rhythm-initially required amiodarone-reviewed cardiology note-anticoagulation issue to be addressed upon outpatient follow-up.  Thrombocytopenia: Resolved-likely due to sepsis.  Anemia: Multifactorial etiology-but mostly due to acute illness-no evidence of blood loss-follow closely and transfuse if Hb <7.  Hypokalemia: Replete and recheck.  Hyponatremia: Continues to have hyponatremia-slightly better than yesterday-asymptomatic-volume status appears stable but patient with very poor intake over the past few days.  Was on Lasix that was discontinued on 12/9-due to worsening hyponatremia.  We will continue with gentle hydration with IVF-and follow-up sodium trend.  Repeat hyponatremia labs tomorrow.  HFpEF: Volume status improved--no longer on Lasix  DM-2 (A1c 8.27 October 2020): CBGs now decreasing-no longer on steroids-this was discontinued on 12/7-stop Levemir-continue SSI and follow CBG trend.    Recent Labs    01/02/21 0014 01/02/21 0415 01/02/21 0733  GLUCAP 133* 102* 86     Hypothyroidism: Continue Synthroid-both TSH/free T4 elevated on recent labs-likely due to sick euthyroid syndrome-recommendations are to repeat thyroid function test in a few weeks.  GERD: Continue PPI  Dysphagia: Tolerating dysphagia 3 diet-I suspect this is from deconditioning.  SLP following.  Back pain: Due to discitis-prior compression fractures.  Continue with as needed narcotics.  Left elbow pain: Unclear etiology-x-rays negative for any significant abnormality-no erythema swelling that she had several days ago has  significantly improved.  She tells me today this has been going on for almost 2 months-Doppler negative for DVT-awaiting CT of the elbow  Morbid Obesity Estimated body mass index is 37.98 kg/m as calculated from the following:   Height as of this encounter: 5' (1.524 m).   Weight as of this encounter: 88.2 kg.    Procedures: None Consults: DM, ID, neurosurgery DVT Prophylaxis: Heparin Code Status:Full code  Family Communication: [id number] updated over the phone on 12/10.  Time spent: 25 minutes-Greater than 50% of this time was spent in counseling, explanation of diagnosis, planning of further management, and coordination of care.   Disposition Plan: Status is: Inpatient  Remains inpatient appropriate because: Bacteremia-epidural abscess-lumbar osteomyelitis-febrile-hyponatremia-not yet stable for discharge.    Diet: Diet Order             Diet regular Room service appropriate? Yes with Assist; Fluid consistency: Thin  Diet effective now                     Antimicrobial agents: Anti-infectives (From admission, onward)    Start     Dose/Rate Route Frequency Ordered Stop   12/21/20 1800  ceFAZolin (ANCEF) IVPB 2g/100 mL premix        2 g 200 mL/hr over 30 Minutes Intravenous Every 8 hours 12/21/20 1216     12/20/20 1800  cefTRIAXone (ROCEPHIN) 2 g in sodium chloride 0.9 % 100 mL IVPB  Status:  Discontinued        2 g 200 mL/hr over 30 Minutes Intravenous Every 24 hours 12/20/20 0920 12/21/20 1216   12/20/20 0100  meropenem (MERREM) 1 g in sodium chloride 0.9 % 100 mL IVPB  Status:  Discontinued        1 g 200 mL/hr over 30 Minutes Intravenous Every 8 hours 12/20/20 0004 12/20/20 0920   12/19/20 2000  ceFEPIme (MAXIPIME) 2 g in sodium chloride 0.9 % 100 mL IVPB  Status:  Discontinued        2 g 200 mL/hr over 30 Minutes Intravenous Every 24 hours 12/19/20 0057 12/19/20 1023   12/19/20 2000  cefTRIAXone (ROCEPHIN) 2 g in sodium chloride 0.9 % 100  mL IVPB  Status:  Discontinued        2 g 200 mL/hr over 30 Minutes Intravenous Every 24 hours 12/19/20 1023 12/20/20 0004   12/19/20 0057  vancomycin variable dose per unstable renal function (pharmacist dosing)  Status:  Discontinued         Does not apply See admin instructions 12/19/20 0057 12/19/20 1023   12/18/20 2000  ceFEPIme (MAXIPIME) 2 g in sodium chloride 0.9 % 100 mL IVPB        2 g 200 mL/hr over 30 Minutes Intravenous  Once 12/18/20 1955 12/18/20 2050   12/18/20 2000  metroNIDAZOLE (FLAGYL) IVPB 500 mg        500 mg 100 mL/hr over 60 Minutes Intravenous  Once 12/18/20 1955 12/18/20 2210   12/18/20 2000  vancomycin (VANCOREADY) IVPB 1750 mg/350 mL        1,750 mg 175 mL/hr over 120 Minutes Intravenous  Once 12/18/20 1955 12/19/20 0045        MEDICATIONS: Scheduled Meds:  acetaminophen  650 mg Oral Once   amitriptyline  100 mg Oral QHS  arformoterol  15 mcg Nebulization BID   calcium carbonate  800 mg of elemental calcium Oral BID   calcium-vitamin D  0.5 tablet Oral BID   Chlorhexidine Gluconate Cloth  6 each Topical Daily   fentaNYL (SUBLIMAZE) injection  100 mcg Intravenous Once   fluticasone  1 spray Each Nare Daily   gabapentin  300 mg Oral BID   heparin injection (subcutaneous)  5,000 Units Subcutaneous Q8H   insulin aspart  0-15 Units Subcutaneous Q4H   insulin detemir  8 Units Subcutaneous Daily   levothyroxine  150 mcg Oral Q0600   lidocaine  1 patch Transdermal Daily   loratadine  10 mg Oral Daily   mouth rinse  15 mL Mouth Rinse BID   pantoprazole  40 mg Oral Daily   polyethylene glycol  17 g Oral Daily   pramipexole  1.5 mg Oral TID   revefenacin  175 mcg Nebulization Daily   sodium chloride flush  10-40 mL Intracatheter Q12H   Continuous Infusions:  sodium chloride Stopped (12/21/20 1240)   sodium chloride      ceFAZolin (ANCEF) IV 2 g (01/02/21 0903)   PRN Meds:.sodium chloride, acetaminophen **OR** acetaminophen, benzonatate,  dextromethorphan, docusate sodium, fentaNYL (SUBLIMAZE) injection, guaiFENesin, hydrOXYzine, ipratropium-albuterol, lip balm, methocarbamol, naLOXone (NARCAN)  injection, oxyCODONE, phenol, sodium chloride flush   I have personally reviewed following labs and imaging studies  LABORATORY DATA: CBC: Recent Labs  Lab 12/27/20 0356 12/28/20 0330 12/30/20 0609 12/31/20 0222  WBC 9.4 6.2 7.7 6.6  HGB 8.4* 8.3* 7.6* 7.8*  HCT 25.0* 24.9* 23.4* 24.4*  MCV 86.5 89.2 88.3 91.7  PLT 265 266 251 222     Basic Metabolic Panel: Recent Labs  Lab 12/28/20 0330 12/30/20 0609 12/31/20 0222 01/01/21 0243 01/02/21 0108  NA 130* 126* 126* 124* 125*  K 3.8 3.2* 4.0 3.3* 3.9  CL 90* 86* 88* 85* 87*  CO2 34* 31 29 27 29   GLUCOSE 156* 121* 123* 117* 136*  BUN 12 14 14 12 12   CREATININE 0.80 0.87 0.92 1.14* 1.05*  CALCIUM 8.0* 7.9* 7.9* 7.6* 8.4*     GFR: Estimated Creatinine Clearance: 47.2 mL/min (A) (by C-G formula based on SCr of 1.05 mg/dL (H)).  Liver Function Tests: No results for input(s): AST, ALT, ALKPHOS, BILITOT, PROT, ALBUMIN in the last 168 hours. No results for input(s): LIPASE, AMYLASE in the last 168 hours. No results for input(s): AMMONIA in the last 168 hours.  Coagulation Profile: No results for input(s): INR, PROTIME in the last 168 hours.  Cardiac Enzymes: No results for input(s): CKTOTAL, CKMB, CKMBINDEX, TROPONINI in the last 168 hours.  BNP (last 3 results) No results for input(s): PROBNP in the last 8760 hours.  Lipid Profile: No results for input(s): CHOL, HDL, LDLCALC, TRIG, CHOLHDL, LDLDIRECT in the last 72 hours.  Thyroid Function Tests: No results for input(s): TSH, T4TOTAL, FREET4, T3FREE, THYROIDAB in the last 72 hours.  Anemia Panel: No results for input(s): VITAMINB12, FOLATE, FERRITIN, TIBC, IRON, RETICCTPCT in the last 72 hours.  Urine analysis:    Component Value Date/Time   COLORURINE YELLOW 12/30/2020 1022   APPEARANCEUR CLEAR  12/30/2020 1022   LABSPEC <1.005 (L) 12/30/2020 1022   PHURINE 7.0 12/30/2020 1022   GLUCOSEU NEGATIVE 12/30/2020 1022   HGBUR MODERATE (A) 12/30/2020 1022   BILIRUBINUR NEGATIVE 12/30/2020 1022   KETONESUR NEGATIVE 12/30/2020 1022   PROTEINUR NEGATIVE 12/30/2020 1022   UROBILINOGEN 0.2 09/15/2012 1536   NITRITE NEGATIVE 12/30/2020 1022  LEUKOCYTESUR MODERATE (A) 12/30/2020 1022    Sepsis Labs: Lactic Acid, Venous    Component Value Date/Time   LATICACIDVEN 1.5 12/20/2020 0429    MICROBIOLOGY: Recent Results (from the past 240 hour(s))  Resp Panel by RT-PCR (Flu A&B, Covid) Nasopharyngeal Swab     Status: None   Collection Time: 12/29/20  3:29 PM   Specimen: Nasopharyngeal Swab; Nasopharyngeal(NP) swabs in vial transport medium  Result Value Ref Range Status   SARS Coronavirus 2 by RT PCR NEGATIVE NEGATIVE Final    Comment: (NOTE) SARS-CoV-2 target nucleic acids are NOT DETECTED.  The SARS-CoV-2 RNA is generally detectable in upper respiratory specimens during the acute phase of infection. The lowest concentration of SARS-CoV-2 viral copies this assay can detect is 138 copies/mL. A negative result does not preclude SARS-Cov-2 infection and should not be used as the sole basis for treatment or other patient management decisions. A negative result may occur with  improper specimen collection/handling, submission of specimen other than nasopharyngeal swab, presence of viral mutation(s) within the areas targeted by this assay, and inadequate number of viral copies(<138 copies/mL). A negative result must be combined with clinical observations, patient history, and epidemiological information. The expected result is Negative.  Fact Sheet for Patients:  EntrepreneurPulse.com.au  Fact Sheet for Healthcare Providers:  IncredibleEmployment.be  This test is no t yet approved or cleared by the [place] FDA and  has been authorized for  detection and/or diagnosis of SARS-CoV-2 by FDA under an Emergency Use Authorization (EUA). This EUA will remain  in effect (meaning this test can be used) for the duration of the COVID-19 declaration under Section 564(b)(1) of the Act, 21 U.S.C.section 360bbb-3(b)(1), unless the authorization is terminated  or revoked sooner.       Influenza A by PCR NEGATIVE NEGATIVE Final   Influenza B by PCR NEGATIVE NEGATIVE Final    Comment: (NOTE) The Xpert Xpress SARS-CoV-2/FLU/RSV plus assay is intended as an aid in the diagnosis of influenza from Nasopharyngeal swab specimens and should not be used as a sole basis for treatment. Nasal washings and aspirates are unacceptable for Xpert Xpress SARS-CoV-2/FLU/RSV testing.  Fact Sheet for Patients: EntrepreneurPulse.com.au  Fact Sheet for Healthcare Providers: IncredibleEmployment.be  This test is not yet approved or cleared by the [place] FDA and has been authorized for detection and/or diagnosis of SARS-CoV-2 by FDA under an Emergency Use Authorization (EUA). This EUA will remain in effect (meaning this test can be used) for the duration of the COVID-19 declaration under Section 564(b)(1) of the Act, 21 U.S.C. section 360bbb-3(b)(1), unless the authorization is terminated or revoked.  Performed at [facility], [place] [address] [place], [place] [address]   Culture, blood (routine x 2)     Status: None (Preliminary result)   Collection Time: 12/30/20 11:33 AM   Specimen: BLOOD RIGHT HAND  Result Value Ref Range Status   Specimen Description BLOOD RIGHT HAND  Final   Special Requests   Final    BOTTLES DRAWN AEROBIC AND ANAEROBIC Blood Culture adequate volume   Culture   Final    NO GROWTH 3 DAYS Performed at [facility], [place] [address], [place],  [address]    Report Status PENDING  Incomplete  Culture, blood (routine x 2)     Status: None (Preliminary result)    Collection Time: 12/30/20 11:43 AM   Specimen: BLOOD RIGHT HAND  Result Value Ref Range Status   Specimen Description BLOOD RIGHT HAND  Final  Special Requests   Final    BOTTLES DRAWN AEROBIC AND ANAEROBIC Blood Culture adequate volume   Culture   Final    NO GROWTH 3 DAYS Performed at [facility], [place] [address], [place], [place] [address]    Report Status PENDING  Incomplete    RADIOLOGY STUDIES/RESULTS: VAS [place] UPPER EXTREMITY VENOUS DUPLEX  Result Date: 01/01/2021 UPPER VENOUS STUDY  Patient Name:  Jillian Leblanc  Date of Exam:   01/01/2021 Medical Rec #: [id number]       Accession #:    [id number] Date of Birth: [birth year]       Patient Gender: F Patient Age:   45 years Exam Location:  [facility] Procedure:      VAS [place] UPPER EXTREMITY VENOUS DUPLEX Referring Phys: Oren Binet --------------------------------------------------------------------------------  Indications: Swelling Limitations: Bandages. Comparison Study: no prior Performing Technologist: Archie Patten RVS  Examination Guidelines: A complete evaluation includes B-mode imaging, spectral Doppler, color Doppler, and power Doppler as needed of all accessible portions of each vessel. Bilateral testing is considered an integral part of a complete examination. Limited examinations for reoccurring indications may be performed as noted.  Right Findings: +----------+------------+---------+-----------+----------+-------+ RIGHT     CompressiblePhasicitySpontaneousPropertiesSummary +----------+------------+---------+-----------+----------+-------+ Subclavian               Yes       Yes                      +----------+------------+---------+-----------+----------+-------+  Left Findings: +----------+------------+---------+-----------+----------+-------+ LEFT      CompressiblePhasicitySpontaneousPropertiesSummary +----------+------------+---------+-----------+----------+-------+ IJV           Full        Yes       Yes                      +----------+------------+---------+-----------+----------+-------+ Subclavian    Full       Yes       Yes                      +----------+------------+---------+-----------+----------+-------+ Axillary      Full       Yes       Yes                      +----------+------------+---------+-----------+----------+-------+ Brachial      Full       Yes       Yes                      +----------+------------+---------+-----------+----------+-------+ Radial        Full                                          +----------+------------+---------+-----------+----------+-------+ Ulnar         Full                                          +----------+------------+---------+-----------+----------+-------+ Cephalic      Full                                          +----------+------------+---------+-----------+----------+-------+  Summary:  Right:  No evidence of thrombosis in the subclavian.  Left: No evidence of deep vein thrombosis in the upper extremity. No evidence of superficial vein thrombosis in the upper extremity.  *See table(s) above for measurements and observations.    Preliminary      LOS: 15 days   Oren Binet, MD  [facility]    To contact the attending provider between 7A-7P or the covering provider during after hours 7P-7A, please log into the web site [email or web address] and access using universal [place] password for that web site. If you do not have the password, please call the hospital operator.  01/02/2021, 11:09 AM

## 2021-01-02 NOTE — Plan of Care (Signed)

## 2021-01-03 DIAGNOSIS — R7881 Bacteremia: Secondary | ICD-10-CM | POA: Diagnosis not present

## 2021-01-03 DIAGNOSIS — N179 Acute kidney failure, unspecified: Secondary | ICD-10-CM | POA: Diagnosis not present

## 2021-01-03 DIAGNOSIS — G9341 Metabolic encephalopathy: Secondary | ICD-10-CM | POA: Diagnosis not present

## 2021-01-03 DIAGNOSIS — E871 Hypo-osmolality and hyponatremia: Secondary | ICD-10-CM | POA: Diagnosis not present

## 2021-01-03 LAB — BASIC METABOLIC PANEL
Anion gap: 14 (ref 5–15)
BUN: 19 mg/dL (ref 8–23)
CO2: 19 mmol/L — ABNORMAL LOW (ref 22–32)
Calcium: 8 mg/dL — ABNORMAL LOW (ref 8.9–10.3)
Chloride: 86 mmol/L — ABNORMAL LOW (ref 98–111)
Creatinine, Ser: 1.3 mg/dL — ABNORMAL HIGH (ref 0.44–1.00)
GFR, Estimated: 43 mL/min — ABNORMAL LOW (ref >60)
Glucose, Bld: 165 mg/dL — ABNORMAL HIGH (ref 70–99)
Potassium: 4.2 mmol/L (ref 3.5–5.1)
Sodium: 119 mmol/L — CL (ref 135–145)

## 2021-01-03 LAB — CBC
HCT: 23.6 % — ABNORMAL LOW (ref 36.0–46.0)
Hemoglobin: 7.8 g/dL — ABNORMAL LOW (ref 12.0–15.0)
MCH: 28.9 pg (ref 26.0–34.0)
MCHC: 33.1 g/dL (ref 30.0–36.0)
MCV: 87.4 fL (ref 80.0–100.0)
Platelets: 294 10*3/uL (ref 150–400)
RBC: 2.7 MIL/uL — ABNORMAL LOW (ref 3.87–5.11)
RDW: 15.9 % — ABNORMAL HIGH (ref 11.5–15.5)
WBC: 9.8 10*3/uL (ref 4.0–10.5)
nRBC: 0.2 % (ref 0.0–0.2)

## 2021-01-03 LAB — RENAL FUNCTION PANEL
Albumin: 2.7 g/dL — ABNORMAL LOW (ref 3.5–5.0)
Anion gap: 10 (ref 5–15)
BUN: 22 mg/dL (ref 8–23)
CO2: 23 mmol/L (ref 22–32)
Calcium: 8 mg/dL — ABNORMAL LOW (ref 8.9–10.3)
Chloride: 89 mmol/L — ABNORMAL LOW (ref 98–111)
Creatinine, Ser: 1.35 mg/dL — ABNORMAL HIGH (ref 0.44–1.00)
GFR, Estimated: 41 mL/min — ABNORMAL LOW (ref >60)
Glucose, Bld: 126 mg/dL — ABNORMAL HIGH (ref 70–99)
Phosphorus: 4.5 mg/dL (ref 2.5–4.6)
Potassium: 3.7 mmol/L (ref 3.5–5.1)
Sodium: 122 mmol/L — ABNORMAL LOW (ref 135–145)

## 2021-01-03 LAB — GLUCOSE, CAPILLARY
Glucose-Capillary: 110 mg/dL — ABNORMAL HIGH (ref 70–99)
Glucose-Capillary: 127 mg/dL — ABNORMAL HIGH (ref 70–99)
Glucose-Capillary: 146 mg/dL — ABNORMAL HIGH (ref 70–99)
Glucose-Capillary: 146 mg/dL — ABNORMAL HIGH (ref 70–99)
Glucose-Capillary: 164 mg/dL — ABNORMAL HIGH (ref 70–99)
Glucose-Capillary: 95 mg/dL (ref 70–99)

## 2021-01-03 LAB — AMMONIA: Ammonia: 21 umol/L (ref 9–35)

## 2021-01-03 LAB — SODIUM, URINE, RANDOM: Sodium, Ur: 13 mmol/L

## 2021-01-03 LAB — OSMOLALITY: Osmolality: 262 mOsm/kg — ABNORMAL LOW (ref 275–295)

## 2021-01-03 MED ORDER — SODIUM CHLORIDE 1 G PO TABS
1.0000 g | ORAL_TABLET | Freq: Three times a day (TID) | ORAL | Status: DC
  Administered 2021-01-03 – 2021-01-06 (×6): 1 g via ORAL
  Filled 2021-01-03 (×10): qty 1

## 2021-01-03 MED ORDER — SODIUM BICARBONATE 650 MG PO TABS
650.0000 mg | ORAL_TABLET | Freq: Three times a day (TID) | ORAL | Status: DC
  Administered 2021-01-03 (×2): 650 mg via ORAL
  Filled 2021-01-03 (×3): qty 1

## 2021-01-03 MED ORDER — ALBUMIN HUMAN 25 % IV SOLN
25.0000 g | Freq: Four times a day (QID) | INTRAVENOUS | Status: AC
  Administered 2021-01-03 (×3): 25 g via INTRAVENOUS
  Filled 2021-01-03 (×3): qty 100

## 2021-01-03 MED ORDER — MORPHINE SULFATE (PF) 2 MG/ML IV SOLN
1.0000 mg | INTRAVENOUS | Status: DC | PRN
  Administered 2021-01-03 – 2021-01-05 (×6): 1 mg via INTRAVENOUS
  Filled 2021-01-03 (×6): qty 1

## 2021-01-03 MED ORDER — FUROSEMIDE 10 MG/ML IJ SOLN
40.0000 mg | Freq: Once | INTRAMUSCULAR | Status: AC
  Administered 2021-01-03: 40 mg via INTRAVENOUS
  Filled 2021-01-03: qty 4

## 2021-01-03 NOTE — Progress Notes (Signed)
0445 pt awaken for VS reading per unit protocol, pt w/ increased confusion , manual BP 148/102, HR 89, 99.2 axillary, BG 95, pt hallucinating, moaning out, MD notified and presented to bedside a few minutes later. No new orders at this time, pt repositioned, call bell in reach, bed alarm activated and audible, will continue to monitor and assess frequently.

## 2021-01-03 NOTE — Significant Event (Addendum)
Patient's nurse notified me around 4:55 AM the patient appears confused after waking up few minutes ago.  As per patient's nurse and the patient's nursing tech last evening patient was alert awake and oriented x4.  On arrival at bedside patient appears confused and having visual hallucinations.  Moving all extremities without difficulty but restricted by back pain.  Pupils equal and reactive to light no facial asymmetry no difficulty speaking.  [name] oriented to her name and place.  I reviewed patient's medications, recent labs and notes.  Discussed with on-call neurologist.  Given that patient has visual hallucinations and no obvious focal deficits patient mental changes are consistent with acute delirium.  Will check labs.  Closely monitor.  Midge Minium

## 2021-01-03 NOTE — Progress Notes (Addendum)
PROGRESS NOTE        PATIENT DETAILS Name: Jillian Leblanc Age: 75 y.o. Sex: female Date of Birth: 03-22-1945 Admit Date: 12/18/2020 Admitting Physician Rhetta Mura, DO [id number], [name], MD  Brief Narrative: [name] a 75 y.o. 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 coli bacteremia-along with vertebral osteomyelitis involving L4-S1.  Initially managed in the [facility] stability-transferred to the [facility].   Subjective: Some delirium last night-appears uncomfortable this morning due to back pain.  Objective: Vitals: Blood pressure (!) 115/47, pulse 89, temperature 99.4 F (37.4 C), temperature source Axillary, resp. rate (!) 27, height 5' (1.524 m), weight 88.2 kg, SpO2 96 %.   Exam: Gen Exam: Awake/alert-uncomfortable/restless due to back pain. HEENT:atraumatic, normocephalic Chest: B/L clear to auscultation anteriorly CVS:S1S2 regular Abdomen:soft non tender, non distended Extremities:+ edema Neurology: Non focal-but w gen weakness Skin: no rash    Pertinent Labs/Radiology: Recent Labs  Lab 01/03/21 0706  WBC 9.8  HGB 7.8*  PLT 294  NA 119*  K 4.2  CREATININE 1.30*      Assessment/Plan: Septic shock due to E. 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  Repeat UA not suggestive of UTI-repeat blood cultures on 12/7 and negative so far. Continue IV Ancef x6 weeks per ID recommendations.Per neurosurgery-since no obvious weakness on exam-does not require surgery.   Acute metabolic encephalopathy: Due to septic shock/AKI/hospital delirium-overall-mental status has improved-she had some mild delirium last night-this morning  she is awake and alert.  Continue to maintain delirium precautions.    Acute hypoxic respiratory failure: Multifactorial etiology-due to asthma exacerbation and pulmonary edema.  Continues to clinically improve-with supportive care and diuretic regimen-on just 1 L of oxygen.  No longer on steroids.      PAF with RVR: Maintaining sinus rhythm-initially required amiodarone-reviewed cardiology note-anticoagulation issue to be addressed upon outpatient follow-up.  Thrombocytopenia: Resolved-likely due to sepsis.  Anemia: Multifactorial etiology-but mostly due to acute illness-no evidence of blood loss-follow closely and transfuse if Hb <7.  Hypokalemia: Repleted.  Hyponatremia: Sodium continues to downtrend-previously even with furosemide developed worsening hyponatremia-trial of IV fluids was attempted yesterday-however sodium levels have dropped further.  I have stopped IVF-consulted nephrology-recommendations are to start IV albumin/Lasix/salt tablets.  Follow electrolytes closely.  AKI: Mild-felt to be due to intravascular volume depletion-starting albumin/Lasix-see below.  HFpEF: Volume status had improved-now appears volume overloaded-stopping IV fluids-see above  DM-2 (A1c 8.27 October 2020): CBGs stable-no longer on Levemir-continue SSI and follow trend.    Recent Labs    01/03/21 0425 01/03/21 0820 01/03/21 1146  GLUCAP 95 164* 146*     Hypothyroidism: Continue Synthroid-both TSH/free T4 elevated on recent labs-likely due to sick euthyroid syndrome-recommendations are to repeat thyroid function test in a few weeks.  GERD: Continue PPI  Dysphagia: Tolerating dysphagia 3 diet-I suspect this is from deconditioning.  SLP following.  Back pain: Due to discitis-prior compression fractures.  Worsening back pain today-we will watch closely-continue narcotics-if continues to have worsening back pain-May need repeat imaging.  Neurological exam appears unchanged.  Left elbow pain: Seems  to have improved-CT elbow on 12/10-does not  show any collection-shows cellulitis.  Already on IV Ancef.  Dopplers negative for DVT as well.  For now supportive care.   Morbid Obesity Estimated body mass index is 37.98 kg/m as calculated from the following:   Height as of this encounter: 5' (1.524 m).   Weight as of this encounter: 88.2 kg.    Procedures: None Consults: DM, ID, neurosurgery DVT Prophylaxis: Heparin Code Status:Full code  Family Communication: Daughter-[phone] updated over the phone on 12/10.  Time spent: 25 minutes-Greater than 50% of this time was spent in counseling, explanation of diagnosis, planning of further management, and coordination of care.   Disposition Plan: Status is: Inpatient  Remains inpatient appropriate because: Bacteremia-epidural abscess-lumbar osteomyelitis-febrile-hyponatremia-not yet stable for discharge.    Diet: Diet Order             Diet regular Room service appropriate? Yes with Assist; Fluid consistency: Thin  Diet effective now                     Antimicrobial agents: Anti-infectives (From admission, onward)    Start     Dose/Rate Route Frequency Ordered Stop   12/21/20 1800  ceFAZolin (ANCEF) IVPB 2g/100 mL premix        2 g 200 mL/hr over 30 Minutes Intravenous Every 8 hours 12/21/20 1216     12/20/20 1800  cefTRIAXone (ROCEPHIN) 2 g in sodium chloride 0.9 % 100 mL IVPB  Status:  Discontinued        2 g 200 mL/hr over 30 Minutes Intravenous Every 24 hours 12/20/20 0920 12/21/20 1216   12/20/20 0100  meropenem (MERREM) 1 g in sodium chloride 0.9 % 100 mL IVPB  Status:  Discontinued        1 g 200 mL/hr over 30 Minutes Intravenous Every 8 hours 12/20/20 0004 12/20/20 0920   12/19/20 2000  ceFEPIme (MAXIPIME) 2 g in sodium chloride 0.9 % 100 mL IVPB  Status:  Discontinued        2 g 200 mL/hr over 30 Minutes Intravenous Every 24 hours 12/19/20 0057 12/19/20 1023   12/19/20 2000  cefTRIAXone (ROCEPHIN) 2  g in sodium chloride 0.9 % 100 mL IVPB  Status:  Discontinued        2 g 200 mL/hr over 30 Minutes Intravenous Every 24 hours 12/19/20 1023 12/20/20 0004   12/19/20 0057  vancomycin variable dose per unstable renal function (pharmacist dosing)  Status:  Discontinued         Does not apply See admin instructions 12/19/20 0057 12/19/20 1023   12/18/20 2000  ceFEPIme (MAXIPIME) 2 g in sodium chloride 0.9 % 100 mL IVPB        2 g 200 mL/hr over 30 Minutes Intravenous  Once 12/18/20 1955 12/18/20 2050   12/18/20 2000  metroNIDAZOLE (FLAGYL) IVPB 500 mg        500 mg 100 mL/hr over 60 Minutes Intravenous  Once 12/18/20 1955 12/18/20 2210   12/18/20 2000  vancomycin (VANCOREADY) IVPB 1750 mg/350 mL        1,750 mg 175 mL/hr over 120 Minutes Intravenous  Once 12/18/20 1955 12/19/20 0045        MEDICATIONS: Scheduled Meds:  acetaminophen  650 mg Oral Once   arformoterol  15 mcg Nebulization BID   calcium carbonate  800 mg of elemental calcium Oral BID   calcium-vitamin D  0.5 tablet Oral BID   fentaNYL (SUBLIMAZE) injection  100 mcg Intravenous Once   fluticasone  1 spray Each Nare Daily   gabapentin  300 mg Oral BID   heparin injection (subcutaneous)  5,000 Units Subcutaneous Q8H   insulin aspart  0-15 Units Subcutaneous Q4H   levothyroxine  150 mcg Oral Q0600   lidocaine  1 patch Transdermal Daily   loratadine  10 mg Oral Daily   mouth rinse  15 mL Mouth Rinse BID   pantoprazole  40 mg Oral Daily   polyethylene glycol  17 g Oral Daily   pramipexole  1.5 mg Oral TID   revefenacin  175 mcg Nebulization Daily   sodium bicarbonate  650 mg Oral TID   sodium chloride flush  10-40 mL Intracatheter Q12H   sodium chloride  1 g Oral TID WC   Continuous Infusions:  sodium chloride Stopped (12/21/20 1240)   sodium chloride     albumin human 25 g (01/03/21 0931)    ceFAZolin (ANCEF) IV 2 g (01/03/21 0844)   PRN Meds:.sodium chloride, acetaminophen **OR** acetaminophen, benzonatate,  dextromethorphan, docusate sodium, fentaNYL (SUBLIMAZE) injection, guaiFENesin, hydrOXYzine, ipratropium-albuterol, lip balm, methocarbamol, morphine injection, naLOXone (NARCAN)  injection, oxyCODONE, phenol, sodium chloride flush   I have personally reviewed following labs and imaging studies  LABORATORY DATA: CBC: Recent Labs  Lab 12/28/20 0330 12/30/20 0609 12/31/20 0222 01/03/21 0706  WBC 6.2 7.7 6.6 9.8  HGB 8.3* 7.6* 7.8* 7.8*  HCT 24.9* 23.4* 24.4* 23.6*  MCV 89.2 88.3 91.7 87.4  PLT 266 251 222 294     Basic Metabolic Panel: Recent Labs  Lab 12/30/20 0609 12/31/20 0222 01/01/21 0243 01/02/21 0108 01/03/21 0706  NA 126* 126* 124* 125* 119*  K 3.2* 4.0 3.3* 3.9 4.2  CL 86* 88* 85* 87* 86*  CO2 31 29 27 29  19*  GLUCOSE 121* 123* 117* 136* 165*  BUN 14 14 12 12 19   CREATININE 0.87 0.92 1.14* 1.05* 1.30*  CALCIUM 7.9* 7.9* 7.6* 8.4* 8.0*     GFR: Estimated Creatinine Clearance: 38.1 mL/min (A) (by C-G formula based on SCr of 1.3 mg/dL (H)).  Liver Function Tests: No results for input(s): AST, ALT, ALKPHOS, BILITOT, PROT, ALBUMIN in the last 168 hours. No results for input(s): LIPASE, AMYLASE in the last 168 hours. Recent Labs  Lab 01/03/21 0706  AMMONIA 21    Coagulation Profile: No results for input(s): INR, PROTIME in the last 168 hours.  Cardiac Enzymes: No results for input(s): CKTOTAL, CKMB, CKMBINDEX, TROPONINI in the last 168 hours.  BNP (last 3 results) No results for input(s): PROBNP in the last 8760 hours.  Lipid Profile: No results for input(s): CHOL, HDL, LDLCALC, TRIG, CHOLHDL, LDLDIRECT in the last 72 hours.  Thyroid Function Tests: No results for input(s): TSH, T4TOTAL, FREET4, T3FREE, THYROIDAB in the last 72 hours.  Anemia Panel: No results for input(s): VITAMINB12, FOLATE, FERRITIN, TIBC, IRON, RETICCTPCT in the last 72 hours.  Urine analysis:    Component Value Date/Time   COLORURINE YELLOW 12/30/2020 1022   APPEARANCEUR  CLEAR 12/30/2020 1022   LABSPEC <1.005 (L) 12/30/2020 1022   PHURINE 7.0 12/30/2020 1022   GLUCOSEU NEGATIVE 12/30/2020 1022   HGBUR MODERATE (A) 12/30/2020 1022   BILIRUBINUR NEGATIVE 12/30/2020 1022   KETONESUR NEGATIVE 12/30/2020 1022   PROTEINUR NEGATIVE 12/30/2020 1022   UROBILINOGEN 0.2 09/15/2012 1536   NITRITE NEGATIVE 12/30/2020 1022   LEUKOCYTESUR MODERATE (A) 12/30/2020 1022    Sepsis Labs: Lactic Acid, Venous    Component Value Date/Time   LATICACIDVEN 1.5 12/20/2020 0429    MICROBIOLOGY: Recent  Results (from the past 240 hour(s))  Resp Panel by RT-PCR (Flu A&B, Covid) Nasopharyngeal Swab     Status: None   Collection Time: 12/29/20  3:29 PM   Specimen: Nasopharyngeal Swab; Nasopharyngeal(NP) swabs in vial transport medium  Result Value Ref Range Status   SARS Coronavirus 2 by RT PCR NEGATIVE NEGATIVE Final    Comment: (NOTE) SARS-CoV-2 target nucleic acids are NOT DETECTED.  The SARS-CoV-2 RNA is generally detectable in upper respiratory specimens during the acute phase of infection. The lowest concentration of SARS-CoV-2 viral copies this assay can detect is 138 copies/mL. A negative result does not preclude SARS-Cov-2 infection and should not be used as the sole basis for treatment or other patient management decisions. A negative result may occur with  improper specimen collection/handling, submission of specimen other than nasopharyngeal swab, presence of viral mutation(s) within the areas targeted by this assay, and inadequate number of viral copies(<138 copies/mL). A negative result must be combined with clinical observations, patient history, and epidemiological information. The expected result is Negative.  Fact Sheet for Patients:  BloggerCourse.comhttps://www.fda.gov/media/152166/download  Fact Sheet for Healthcare Providers:  SeriousBroker.ithttps://www.fda.gov/media/152162/download  This test is no t yet approved or cleared by the [place] FDA and  has been authorized  for detection and/or diagnosis of SARS-CoV-2 by FDA under an Emergency Use Authorization (EUA). This EUA will remain  in effect (meaning this test can be used) for the duration of the COVID-19 declaration under Section 564(b)(1) of the Act, 21 U.S.C.section 360bbb-3(b)(1), unless the authorization is terminated  or revoked sooner.       Influenza A by PCR NEGATIVE NEGATIVE Final   Influenza B by PCR NEGATIVE NEGATIVE Final    Comment: (NOTE) The Xpert Xpress SARS-CoV-2/FLU/RSV plus assay is intended as an aid in the diagnosis of influenza from Nasopharyngeal swab specimens and should not be used as a sole basis for treatment. Nasal washings and aspirates are unacceptable for Xpert Xpress SARS-CoV-2/FLU/RSV testing.  Fact Sheet for Patients: BloggerCourse.comhttps://www.fda.gov/media/152166/download  Fact Sheet for Healthcare Providers: SeriousBroker.ithttps://www.fda.gov/media/152162/download  This test is not yet approved or cleared by the [place] FDA and has been authorized for detection and/or diagnosis of SARS-CoV-2 by FDA under an Emergency Use Authorization (EUA). This EUA will remain in effect (meaning this test can be used) for the duration of the COVID-19 declaration under Section 564(b)(1) of the Act, 21 U.S.C. section 360bbb-3(b)(1), unless the authorization is terminated or revoked.  Performed at [facility], [address] [place], [place] [address]   Culture, blood (routine x 2)     Status: None (Preliminary result)   Collection Time: 12/30/20 11:33 AM   Specimen: BLOOD RIGHT HAND  Result Value Ref Range Status   Specimen Description BLOOD RIGHT HAND  Final   Special Requests   Final    BOTTLES DRAWN AEROBIC AND ANAEROBIC Blood Culture adequate volume   Culture   Final    NO GROWTH 4 DAYS Performed at [facility], [address] [place], [place] [address]    Report Status PENDING  Incomplete  Culture, blood (routine x 2)     Status: None (Preliminary result)    Collection Time: 12/30/20 11:43 AM   Specimen: BLOOD RIGHT HAND  Result Value Ref Range Status   Specimen Description BLOOD RIGHT HAND  Final   Special Requests   Final    BOTTLES DRAWN AEROBIC AND ANAEROBIC Blood Culture adequate volume   Culture   Final    NO GROWTH  4 DAYS Performed at [facility], [place] [address], [place],  [address]    Report Status PENDING  Incomplete    RADIOLOGY STUDIES/RESULTS: CT ELBOW LEFT WO CONTRAST  Result Date: 01/02/2021 CLINICAL DATA:  Soft tissue infection suspected, elbow, xray done E. coli bacteremia with lumbar spine osteo-/epidural abscess-with persistent left elbow pain. EXAM: CT OF THE UPPER LEFT EXTREMITY WITHOUT CONTRAST TECHNIQUE: Multidetector CT imaging of the upper left extremity was performed according to the standard protocol. COMPARISON:  X-ray 12/30/2020 FINDINGS: Technical note: Examination is limited secondary to beam hardening artifact related to patient positioning of the elbow adjacent to the abdominal wall. Elbow is flexed resulting in nonstandard imaging planes. Bones/Joint/Cartilage No acute fracture. No dislocation. Elbow joint spaces are preserved. No erosion or periosteal elevation. No appreciable elbow joint effusion. Ligaments Suboptimally assessed by CT. Muscles and Tendons Evaluation of the musculotendinous structures is significantly limited. No obvious intramuscular fluid collection. Soft tissues Soft tissue edema with ill-defined fluid most pronounced along the ulnar aspect of the proximal forearm. No organized fluid collection is evident by noncontrast CT. No soft tissue gas. Catheter tubing is visualized within the upper arm. IMPRESSION: 1. Limited exam. 2. Soft tissue edema with ill-defined fluid most pronounced along the ulnar aspect of the proximal forearm, suggesting cellulitis. No organized fluid collection is evident. 3. No appreciable elbow joint effusion to suggest septic arthritis. 4. No acute osseous  abnormality. Electronically Signed   By: Davina Poke D.O.   On: 01/02/2021 17:17   VAS [place] UPPER EXTREMITY VENOUS DUPLEX  Result Date: 01/01/2021 UPPER VENOUS STUDY  Patient Name:  VAANI HARDWELL  Date of Exam:   01/01/2021 Medical Rec #: [id number]       Accession #:    [id number] Date of Birth: [birth year]       Patient Gender: F Patient Age:   36 years Exam Location:  [facility] Procedure:      VAS [place] UPPER EXTREMITY VENOUS DUPLEX Referring Phys: Oren Binet --------------------------------------------------------------------------------  Indications: Swelling Limitations: Bandages. Comparison Study: no prior Performing Technologist: Archie Patten RVS  Examination Guidelines: A complete evaluation includes B-mode imaging, spectral Doppler, color Doppler, and power Doppler as needed of all accessible portions of each vessel. Bilateral testing is considered an integral part of a complete examination. Limited examinations for reoccurring indications may be performed as noted.  Right Findings: +----------+------------+---------+-----------+----------+-------+ RIGHT     CompressiblePhasicitySpontaneousPropertiesSummary +----------+------------+---------+-----------+----------+-------+ Subclavian               Yes       Yes                      +----------+------------+---------+-----------+----------+-------+  Left Findings: +----------+------------+---------+-----------+----------+-------+ LEFT      CompressiblePhasicitySpontaneousPropertiesSummary +----------+------------+---------+-----------+----------+-------+ IJV           Full       Yes       Yes                      +----------+------------+---------+-----------+----------+-------+ Subclavian    Full       Yes       Yes                      +----------+------------+---------+-----------+----------+-------+ Axillary      Full       Yes       Yes                       +----------+------------+---------+-----------+----------+-------+  Brachial      Full       Yes       Yes                      +----------+------------+---------+-----------+----------+-------+ Radial        Full                                          +----------+------------+---------+-----------+----------+-------+ Ulnar         Full                                          +----------+------------+---------+-----------+----------+-------+ Cephalic      Full                                          +----------+------------+---------+-----------+----------+-------+  Summary:  Right: No evidence of thrombosis in the subclavian.  Left: No evidence of deep vein thrombosis in the upper extremity. No evidence of superficial vein thrombosis in the upper extremity.  *See table(s) above for measurements and observations.    Preliminary      LOS: 16 days   Oren Binet, MD  [facility]    To contact the attending provider between 7A-7P or the covering provider during after hours 7P-7A, please log into the web site [email or web address] and access using universal [place] password for that web site. If you do not have the password, please call the hospital operator.  01/03/2021, 12:22 PM

## 2021-01-03 NOTE — Progress Notes (Signed)
   01/03/21 1100  Assess: MEWS Score  Temp 99.4 F (37.4 C)  BP (!) 115/47  Pulse Rate 89  ECG Heart Rate 89  Resp (!) 27  SpO2 96 %  O2 Device Nasal Cannula  Assess: MEWS Score  MEWS Temp 0  MEWS Systolic 0  MEWS Pulse 0  MEWS RR 2  MEWS LOC 0  MEWS Score 2  MEWS Score Color Yellow  Assess: if the MEWS score is Yellow or Red  Were vital signs taken at a resting state? Yes  Focused Assessment No change from prior assessment  Early Detection of Sepsis Score *See Row Information* Medium  MEWS guidelines implemented *See Row Information* Yes  Treat  Pain Scale 0-10  Pain Score Asleep  Take Vital Signs  Increase Vital Sign Frequency  Yellow: Q 2hr X 2 then Q 4hr X 2, if remains yellow, continue Q 4hrs  Escalate  MEWS: Escalate Yellow: discuss with charge nurse/RN and consider discussing with provider and RRT  Notify: Charge Nurse/RN  Name of Charge Nurse/RN Notified Sarah RN  Date Charge Nurse/RN Notified 01/03/21  Time Charge Nurse/RN Notified 1109  Document  Progress note created (see row info) Yes

## 2021-01-03 NOTE — Consult Note (Signed)
[facility] Nephrology Consultation Note  Requesting MD: Dr. Jerral Ralph, Sh[place] Reason for consult: Hyponatremia  HPI:  Jillian Leblanc is a 75 y.o. female with past medical history of type 2 diabetes, hypothyroidism, heart failure with preserved EF, sinus pauses status post PPM placement in 07/2019 who was presented on 11/25 with shortness of breath cough fever found to have septic shock, now seen as a consultation for the evaluation of hyponatremia and AKI.  The patient was initially admitted to ICU for pressors and then transferred to the floor.  She was found to have E. coli bacteremia with vertebral osteomyelitis involving L4-S1.  The sepsis physiology has improved initially however patient had temperature of 102 on 12/7.  UA was questionable UTI.  Seen by ID recommended IV Ancef for 6 weeks for osteomyelitis treatment.  No surgical intervention per neurosurgery.  Throughout the hospitalization patient had acute metabolic encephalopathy thought to be due to hospital delirium, sepsis etc.  Also hypoxic respiratory failure requiring bronchodilators, steroid etc.   Patient had AKI with peak creatinine level of 3.6 on 11/12 with seems to be improved to normal.  The baseline creatinine level seems to be around 0.8-0.9.  Now the creatinine level creeping up to 1.30 today. On admission the sodium level was 126 which was gradually improved to 130 on 12/5.  Patient received intermittent diuretics and IV fluid with persistent hyponatremia.  Yesterday she received IV fluid and today the labs showed sodium level of 119 therefore we are consulted. Patient has decreased oral intake.  She reported nauseated and extreme pain.  Urine output charted only 350 cc over 24 hours however she is net negative by 18 L so far.  The urine studies showed without proteinuria however some RBCs.  The urine osmolality was low at 222. The CT scan showed no renal hydronephrosis. Patient was alert awake but very  uncomfortable this morning.  She feels generalized body pain especially back pain.  Also some shortness of breath.  No chest pain headache or dizziness.  Creatinine, Ser  Date/Time Value Ref Range Status  01/03/2021 07:06 AM 1.30 (H) 0.44 - 1.00 mg/dL Final  91/79/1505 69:79 AM 1.05 (H) 0.44 - 1.00 mg/dL Final  48/01/6551 74:82 AM 1.14 (H) 0.44 - 1.00 mg/dL Final  70/78/6754 49:20 AM 0.92 0.44 - 1.00 mg/dL Final  10/30/1217 75:88 AM 0.87 0.44 - 1.00 mg/dL Final  32/54/9826 41:58 AM 0.80 0.44 - 1.00 mg/dL Final  30/94/0768 08:81 AM 0.86 0.44 - 1.00 mg/dL Final  11/24/5943 85:92 PM 0.89 0.44 - 1.00 mg/dL Final  92/44/6286 38:17 AM 1.15 (H) 0.44 - 1.00 mg/dL Final  71/16/5790 38:33 AM 1.12 (H) 0.44 - 1.00 mg/dL Final  38/32/9191 66:06 AM 1.11 (H) 0.44 - 1.00 mg/dL Final  00/45/9977 41:42 AM 1.07 (H) 0.44 - 1.00 mg/dL Final  39/53/2023 34:35 AM 1.56 (H) 0.44 - 1.00 mg/dL Final  68/61/6837 29:02 PM 1.55 (H) 0.44 - 1.00 mg/dL Final  12/09/5206 02:23 AM 1.98 (H) 0.44 - 1.00 mg/dL Final  36/12/2447 75:30 PM 2.20 (H) 0.44 - 1.00 mg/dL Final  06/03/209 17:35 AM 1.43 (H) 0.44 - 1.00 mg/dL Final  67/01/4101 01:31 PM 2.06 (H) 0.44 - 1.00 mg/dL Final  43/88/8757 97:28 AM 2.53 (H) 0.44 - 1.00 mg/dL Final    Comment:    DELTA CHECK NOTED  12/05/2020 12:27 PM 3.64 (H) 0.44 - 1.00 mg/dL Final  20/60/1561 53:79 AM 3.52 (H) 0.44 - 1.00 mg/dL Final  43/27/6147 09:29 PM 3.71 (H) 0.44 - 1.00  mg/dL Final  07/17/7626 31:51 AM 0.88 0.44 - 1.00 mg/dL Final    PMHx:   Past Medical History:  Diagnosis Date   CHF (congestive heart failure) (HCC)    Chronic back pain    Diabetes mellitus without complication (HCC)    Dysrhythmia    Hypothyroidism    Interstitial cystitis    Presence of permanent cardiac pacemaker    Thyroid disease     Past Surgical History:  Procedure Laterality Date   ABDOMINAL HYSTERECTOMY     CHOLECYSTECTOMY     NECK SURGERY     PACEMAKER IMPLANT N/A 08/16/2019   Procedure:  PACEMAKER IMPLANT;  Surgeon: Regan Lemming, MD;  Location: [facility];  Service: Cardiovascular;  Laterality: N/A;   REVERSE SHOULDER ARTHROPLASTY Right 07/28/2020   Procedure: REVERSE SHOULDER ARTHROPLASTY;  Surgeon: Francena Hanly, MD;  Location: [facility];  Service: Orthopedics;  Laterality: Right;   SHOULDER SURGERY     THYROIDECTOMY      Family Hx: History reviewed. No pertinent family history.  Social History:  reports that she has never smoked. She has never used smokeless tobacco. She reports that she does not drink alcohol and does not use drugs.  Allergies:  Allergies  Allergen Reactions   Pentosan Polysulfate Sodium     Other reaction(s): Other (See Comments) ELMIRON Y Drug hair fell out 10/21/2009 12:00:00 AM by Johny Blamer CNA 1   Elmiron [Pentosan Polysulfate]    Naloxone Other (See Comments)    Hallucinations Confusion Nightmares    Medications: Prior to Admission medications   Medication Sig Start Date End Date Taking? Authorizing Provider  amitriptyline (ELAVIL) 100 MG tablet Take 100 mg by mouth at bedtime.   Yes [provider]  aspirin EC 81 MG tablet Take 81 mg by mouth daily. Swallow whole.   Yes [provider]  calcium carbonate (TUMS - DOSED IN MG ELEMENTAL CALCIUM) 500 MG chewable tablet Chew 4 tablets (800 mg of elemental calcium total) by mouth 2 (two) times daily. 12/08/20  Yes Rhetta Mura, MD  calcium-vitamin D 250-100 MG-UNIT tablet Take 1 tablet by mouth 2 (two) times daily. 09/14/20  Yes Palumbo, April, MD  dapagliflozin propanediol (FARXIGA) 10 MG TABS tablet Take 10 mg by mouth daily. 12/12/20  Yes [provider]  furosemide (LASIX) 40 MG tablet Take 40 mg by mouth 2 (two) times daily.   Yes [provider]  gabapentin (NEURONTIN) 300 MG capsule Take 1 capsule (300 mg total) by mouth 2 (two) times daily. 12/08/20  Yes Rhetta Mura, MD  glipiZIDE (GLUCOTROL XL) 2.5 MG 24 hr tablet Take  1 tablet (2.5 mg total) by mouth daily with breakfast. 12/08/20  Yes Rhetta Mura, MD  hydrOXYzine (ATARAX/VISTARIL) 25 MG tablet Take 1 tablet (25 mg total) by mouth at bedtime as needed for anxiety. 12/08/20  Yes Rhetta Mura, MD  levothyroxine (SYNTHROID) 150 MCG tablet Take 150 mcg by mouth daily. 09/01/20  Yes [provider]  methocarbamol (ROBAXIN) 500 MG tablet Take 500 mg by mouth every 8 (eight) hours as needed for muscle spasms. 11/27/20  Yes [provider]  oxybutynin (DITROPAN-XL) 10 MG 24 hr tablet Take 10 mg by mouth at bedtime.   Yes [provider]  pramipexole (MIRAPEX) 1.5 MG tablet Take 1.5 mg by mouth 3 (three) times daily. 06/29/20  Yes [provider]  loperamide (IMODIUM) 2 MG capsule Take 1 capsule (2 mg total) by mouth every 4 (four) hours. Patient not taking:  Reported on 12/18/2020 12/08/20   Nita Sells, MD  ondansetron (ZOFRAN-ODT) 4 MG disintegrating tablet Take 4 mg by mouth every 8 (eight) hours as needed for nausea or vomiting. Patient not taking: Reported on 12/18/2020 09/02/20   [provider]    I have reviewed the patient's current medications.  Labs:  Results for orders placed or performed during the hospital encounter of 12/18/20 (from the past 48 hour(s))  Glucose, capillary     Status: Abnormal   Collection Time: 01/01/21 12:20 PM  Result Value Ref Range   Glucose-Capillary 122 (H) 70 - 99 mg/dL    Comment: Glucose reference range applies only to samples taken after fasting for at least 8 hours.  Glucose, capillary     Status: Abnormal   Collection Time: 01/01/21  3:54 PM  Result Value Ref Range   Glucose-Capillary 268 (H) 70 - 99 mg/dL    Comment: Glucose reference range applies only to samples taken after fasting for at least 8 hours.  Glucose, capillary     Status: Abnormal   Collection Time: 01/01/21  8:47 PM  Result Value Ref Range   Glucose-Capillary 144 (H) 70 - 99 mg/dL     Comment: Glucose reference range applies only to samples taken after fasting for at least 8 hours.  Glucose, capillary     Status: Abnormal   Collection Time: 01/02/21 12:14 AM  Result Value Ref Range   Glucose-Capillary 133 (H) 70 - 99 mg/dL    Comment: Glucose reference range applies only to samples taken after fasting for at least 8 hours.  Basic metabolic panel     Status: Abnormal   Collection Time: 01/02/21  1:08 AM  Result Value Ref Range   Sodium 125 (L) 135 - 145 mmol/L   Potassium 3.9 3.5 - 5.1 mmol/L   Chloride 87 (L) 98 - 111 mmol/L   CO2 29 22 - 32 mmol/L   Glucose, Bld 136 (H) 70 - 99 mg/dL    Comment: Glucose reference range applies only to samples taken after fasting for at least 8 hours.   BUN 12 8 - 23 mg/dL   Creatinine, Ser 1.05 (H) 0.44 - 1.00 mg/dL   Calcium 8.4 (L) 8.9 - 10.3 mg/dL   GFR, Estimated 56 (L) >60 mL/min    Comment: (NOTE) Calculated using the CKD-EPI Creatinine Equation (2021)    Anion gap 9 5 - 15    Comment: Performed at [facility] [address], [place], [place] [address]  Glucose, capillary     Status: Abnormal   Collection Time: 01/02/21  4:15 AM  Result Value Ref Range   Glucose-Capillary 102 (H) 70 - 99 mg/dL    Comment: Glucose reference range applies only to samples taken after fasting for at least 8 hours.  Glucose, capillary     Status: None   Collection Time: 01/02/21  7:33 AM  Result Value Ref Range   Glucose-Capillary 86 70 - 99 mg/dL    Comment: Glucose reference range applies only to samples taken after fasting for at least 8 hours.  Glucose, capillary     Status: Abnormal   Collection Time: 01/02/21 11:33 AM  Result Value Ref Range   Glucose-Capillary 246 (H) 70 - 99 mg/dL    Comment: Glucose reference range applies only to samples taken after fasting for at least 8 hours.  Glucose, capillary     Status: Abnormal   Collection Time: 01/02/21  3:27 PM  Result Value Ref  Range   Glucose-Capillary 157 (H) 70 - 99  mg/dL    Comment: Glucose reference range applies only to samples taken after fasting for at least 8 hours.  Glucose, capillary     Status: Abnormal   Collection Time: 01/02/21  8:36 PM  Result Value Ref Range   Glucose-Capillary 184 (H) 70 - 99 mg/dL    Comment: Glucose reference range applies only to samples taken after fasting for at least 8 hours.  Glucose, capillary     Status: Abnormal   Collection Time: 01/03/21 12:24 AM  Result Value Ref Range   Glucose-Capillary 146 (H) 70 - 99 mg/dL    Comment: Glucose reference range applies only to samples taken after fasting for at least 8 hours.  Glucose, capillary     Status: None   Collection Time: 01/03/21  4:25 AM  Result Value Ref Range   Glucose-Capillary 95 70 - 99 mg/dL    Comment: Glucose reference range applies only to samples taken after fasting for at least 8 hours.  Basic metabolic panel     Status: Abnormal   Collection Time: 01/03/21  7:06 AM  Result Value Ref Range   Sodium 119 (LL) 135 - 145 mmol/L    Comment: CRITICAL RESULT CALLED TO, READ BACK BY AND VERIFIED WITH: C TANOE RN BY SSTEPHENS [phone] [id number]    Potassium 4.2 3.5 - 5.1 mmol/L   Chloride 86 (L) 98 - 111 mmol/L   CO2 19 (L) 22 - 32 mmol/L   Glucose, Bld 165 (H) 70 - 99 mg/dL    Comment: Glucose reference range applies only to samples taken after fasting for at least 8 hours.   BUN 19 8 - 23 mg/dL   Creatinine, Ser 1.611.30 (H) 0.44 - 1.00 mg/dL   Calcium 8.0 (L) 8.9 - 10.3 mg/dL   GFR, Estimated 43 (L) >60 mL/min    Comment: (NOTE) Calculated using the CKD-EPI Creatinine Equation (2021)    Anion gap 14 5 - 15    Comment: Performed at [facility], [address] [place], [place] [address]  CBC     Status: Abnormal   Collection Time: 01/03/21  7:06 AM  Result Value Ref Range   WBC 9.8 4.0 - 10.5 K/uL   RBC 2.70 (L) 3.87 - 5.11 MIL/uL   Hemoglobin 7.8 (L) 12.0 - 15.0 g/dL   HCT 54.023.6 (L) 98.136.0 - 19.146.0 %   MCV 87.4 80.0 - 100.0 fL   MCH 28.9 26.0 -  34.0 pg   MCHC 33.1 30.0 - 36.0 g/dL   RDW 47.815.9 (H) 29.511.5 - 62.115.5 %   Platelets 294 150 - 400 K/uL   nRBC 0.2 0.0 - 0.2 %    Comment: Performed at [facility], [address] [place], [place] [address]  Ammonia     Status: None   Collection Time: 01/03/21  7:06 AM  Result Value Ref Range   Ammonia 21 9 - 35 umol/L    Comment: Performed at [facility], [address] [place], [place] [address]  Glucose, capillary     Status: Abnormal   Collection Time: 01/03/21  8:20 AM  Result Value Ref Range   Glucose-Capillary 164 (H) 70 - 99 mg/dL    Comment: Glucose reference range applies only to samples taken after fasting for at least 8 hours.     ROS:  Pertinent items noted in HPI and remainder of comprehensive ROS otherwise negative.  Physical Exam:  Vitals:   01/03/21 0842 01/03/21 0954  BP:  (!) 107/50  Pulse:  100  Resp:  20  Temp:    SpO2: 94% 94%     General exam: She is in distress because of back pain and uncomfortable Respiratory system: Clear to auscultation. Respiratory effort normal. No wheezing or crackle Cardiovascular system: S1 & S2 heard, RRR.  Bilateral trace edema present Gastrointestinal system: Abdomen is nondistended, soft and nontender. Normal bowel sounds heard. Central nervous system: Alert awake but uncomfortable.. Extremities: Peripheral edema present, no cyanosis or clubbing Skin: No rashes, lesions or ulcers  Assessment/Plan:  #Hyponatremia, acute on chronic: It is likely combination of SIADH triggered by severe back pain and nausea concomitant with very low solute intake.  She has some peripheral edema and sodium level actually worsened with IV fluid.  TSH elevated but much better than before.  I will check a.m. cortisol level.  Agree with discontinuing IV fluid.  Repeat urine sodium and osmolality and follow serum osmolality.  I will order a dose of Lasix 40 mg IV and start oral salt tablet to increase solute.  I hope IV albumin will help  intravascular volume and increased urine output.  Recommend free water restriction and increase intake of solute.  Pain management per primary team.  #Acute kidney injury, nonoliguric: Patient had good urine output however decline overnight.  AKI likely hemodynamically mediated with decreased intravascular volume/third spacing concomitant with prolonged hospitalization with complicated course including septic shock/UTI.  CT scan ruled out hydronephrosis.  I will order bladder scan.  Lasix as discussed above.  Strict ins and out and daily lab.  #Acute metabolic encephalopathy: With long hospitalization, likely ongoing delirium.  Do not think it is caused by hyponatremia.  Patient was alert awake and uncomfortable this morning.   #Sepsis shock due to E. coli bacteremia, L4 S1 osteomyelitis with suspected epidural abscess: Currently on IV Ancef per ID.  Pain management.  #Metabolic acidosis: Start oral sodium bicarbonate.  #Hypokalemia: Potassium level improved.  Discussed with the primary team.  Thank you for the consult we will follow with you.  Gahel Safley Tanna Furry 01/03/2021, 10:53 AM  [employer].

## 2021-01-03 NOTE — Progress Notes (Addendum)
Date and time results received: 01/03/21 0837  Critical Value: NA 119  Name of Provider Notified: Dr. Jerral Ralph  Orders Received? Or Actions Taken?: Orders Received - See Orders for details

## 2021-01-03 NOTE — Significant Event (Signed)
Rapid Response Event Note   Reason for Call :  2nd set of eyes on pt who is confused  Initial Focused Assessment:  Pt lying in bed with eyes open, anxious. Pt appears to be taking to people not in the room. She is alert, but confused and disoriented x 4. She is able to answer some questions. She is c/o back pain. She moves all extremities to command. Pupils 6, round, and sluggish. Skin is cool to touch  T-99.2, HR-89, BP-148/102, RR-21, SpO2-98% on 2L [place], CBG-95.   Interventions:  CBC, BMP already ordered as AM labs Ammonia added by MD  Plan of Care:  Pt seem delirious. Her VS are stable and CBG is ok. She is alert, but anxious and confused. She is in no distress aside from c/o back pain. Await lab values and relay to MD. Continue to monitor pt closely. Call RRT if further assistance needed.    Event Summary:   MD Notified: Dr. [name]  notified PTA RRT Call [phone] Arrival [phone] End [id number]  Terrilyn Saver, RN

## 2021-01-04 ENCOUNTER — Inpatient Hospital Stay (HOSPITAL_COMMUNITY): Payer: Medicare Other | Admitting: Anesthesiology

## 2021-01-04 ENCOUNTER — Inpatient Hospital Stay (HOSPITAL_COMMUNITY): Payer: Medicare Other

## 2021-01-04 ENCOUNTER — Encounter (HOSPITAL_COMMUNITY)
Admission: EM | Disposition: A | Discharge status: Skilled Nursing Facility | Payer: Self-pay | Source: Home / Self Care | Attending: Internal Medicine

## 2021-01-04 ENCOUNTER — Encounter (HOSPITAL_COMMUNITY): Payer: Self-pay | Admitting: Internal Medicine

## 2021-01-04 DIAGNOSIS — G9341 Metabolic encephalopathy: Secondary | ICD-10-CM | POA: Diagnosis not present

## 2021-01-04 DIAGNOSIS — E871 Hypo-osmolality and hyponatremia: Secondary | ICD-10-CM | POA: Diagnosis not present

## 2021-01-04 DIAGNOSIS — D649 Anemia, unspecified: Secondary | ICD-10-CM | POA: Diagnosis not present

## 2021-01-04 DIAGNOSIS — R7881 Bacteremia: Secondary | ICD-10-CM | POA: Diagnosis not present

## 2021-01-04 LAB — OSMOLALITY, URINE: Osmolality, Ur: 361 mOsm/kg (ref 300–900)

## 2021-01-04 LAB — RENAL FUNCTION PANEL
Albumin: 2.5 g/dL — ABNORMAL LOW (ref 3.5–5.0)
Albumin: 2.8 g/dL — ABNORMAL LOW (ref 3.5–5.0)
Anion gap: 10 (ref 5–15)
Anion gap: 9 (ref 5–15)
BUN: 19 mg/dL (ref 8–23)
BUN: 19 mg/dL (ref 8–23)
CO2: 26 mmol/L (ref 22–32)
CO2: 27 mmol/L (ref 22–32)
Calcium: 8.1 mg/dL — ABNORMAL LOW (ref 8.9–10.3)
Calcium: 8 mg/dL — ABNORMAL LOW (ref 8.9–10.3)
Chloride: 87 mmol/L — ABNORMAL LOW (ref 98–111)
Chloride: 91 mmol/L — ABNORMAL LOW (ref 98–111)
Creatinine, Ser: 1.1 mg/dL — ABNORMAL HIGH (ref 0.44–1.00)
Creatinine, Ser: 1.1 mg/dL — ABNORMAL HIGH (ref 0.44–1.00)
GFR, Estimated: 53 mL/min — ABNORMAL LOW (ref >60)
GFR, Estimated: 53 mL/min — ABNORMAL LOW (ref >60)
Glucose, Bld: 113 mg/dL — ABNORMAL HIGH (ref 70–99)
Glucose, Bld: 91 mg/dL (ref 70–99)
Phosphorus: 3.7 mg/dL (ref 2.5–4.6)
Phosphorus: 4.1 mg/dL (ref 2.5–4.6)
Potassium: 3.6 mmol/L (ref 3.5–5.1)
Potassium: 3.1 mmol/L — ABNORMAL LOW (ref 3.5–5.1)
Sodium: 123 mmol/L — ABNORMAL LOW (ref 135–145)
Sodium: 127 mmol/L — ABNORMAL LOW (ref 135–145)

## 2021-01-04 LAB — CBC
HCT: 22.3 % — ABNORMAL LOW (ref 36.0–46.0)
Hemoglobin: 7.3 g/dL — ABNORMAL LOW (ref 12.0–15.0)
MCH: 29 pg (ref 26.0–34.0)
MCHC: 32.7 g/dL (ref 30.0–36.0)
MCV: 88.5 fL (ref 80.0–100.0)
Platelets: 242 10*3/uL (ref 150–400)
RBC: 2.52 MIL/uL — ABNORMAL LOW (ref 3.87–5.11)
RDW: 16.1 % — ABNORMAL HIGH (ref 11.5–15.5)
WBC: 6.1 10*3/uL (ref 4.0–10.5)
nRBC: 0 % (ref 0.0–0.2)

## 2021-01-04 LAB — CULTURE, BLOOD (ROUTINE X 2)
Culture: NO GROWTH
Culture: NO GROWTH
Special Requests: ADEQUATE
Special Requests: ADEQUATE

## 2021-01-04 LAB — GLUCOSE, CAPILLARY
Glucose-Capillary: 113 mg/dL — ABNORMAL HIGH (ref 70–99)
Glucose-Capillary: 127 mg/dL — ABNORMAL HIGH (ref 70–99)
Glucose-Capillary: 135 mg/dL — ABNORMAL HIGH (ref 70–99)
Glucose-Capillary: 145 mg/dL — ABNORMAL HIGH (ref 70–99)
Glucose-Capillary: 92 mg/dL (ref 70–99)
Glucose-Capillary: 96 mg/dL (ref 70–99)

## 2021-01-04 LAB — SURGICAL PCR SCREEN
MRSA, PCR: POSITIVE — AB
Staphylococcus aureus: POSITIVE — AB

## 2021-01-04 LAB — PREPARE RBC (CROSSMATCH)

## 2021-01-04 SURGERY — POSTERIOR LUMBAR FUSION 2 LEVEL
Anesthesia: General | Site: Spine Lumbar

## 2021-01-04 MED ORDER — DEXAMETHASONE SODIUM PHOSPHATE 10 MG/ML IJ SOLN
INTRAMUSCULAR | Status: DC | PRN
  Administered 2021-01-04: 4 mg via INTRAVENOUS

## 2021-01-04 MED ORDER — PHENYLEPHRINE 40 MCG/ML (10ML) SYRINGE FOR IV PUSH (FOR BLOOD PRESSURE SUPPORT)
PREFILLED_SYRINGE | INTRAVENOUS | Status: AC
  Filled 2021-01-04: qty 10

## 2021-01-04 MED ORDER — ACETAMINOPHEN 10 MG/ML IV SOLN
1000.0000 mg | Freq: Once | INTRAVENOUS | Status: AC
  Administered 2021-01-04: 1000 mg via INTRAVENOUS

## 2021-01-04 MED ORDER — SODIUM CHLORIDE 0.9 % IV SOLN: INTRAVENOUS | Status: DC | PRN

## 2021-01-04 MED ORDER — PROPOFOL 10 MG/ML IV BOLUS
INTRAVENOUS | Status: DC | PRN
  Administered 2021-01-04: 60 mg via INTRAVENOUS

## 2021-01-04 MED ORDER — LIDOCAINE 2% (20 MG/ML) 5 ML SYRINGE
INTRAMUSCULAR | Status: DC | PRN
  Administered 2021-01-04: 40 mg via INTRAVENOUS

## 2021-01-04 MED ORDER — BUPIVACAINE HCL (PF) 0.5 % IJ SOLN
INTRAMUSCULAR | Status: AC
  Filled 2021-01-04: qty 30

## 2021-01-04 MED ORDER — HYDROMORPHONE HCL 1 MG/ML IJ SOLN
0.2500 mg | INTRAMUSCULAR | Status: DC | PRN
Start: 2021-01-04 — End: 2021-01-05
  Administered 2021-01-04 (×5): 0.25 mg via INTRAVENOUS

## 2021-01-04 MED ORDER — MIDAZOLAM HCL 2 MG/2ML IJ SOLN
INTRAMUSCULAR | Status: AC
  Filled 2021-01-04: qty 2

## 2021-01-04 MED ORDER — LIDOCAINE-EPINEPHRINE 1 %-1:100000 IJ SOLN
INTRAMUSCULAR | Status: DC | PRN
  Administered 2021-01-04: 5 mL

## 2021-01-04 MED ORDER — OXYCODONE HCL 5 MG PO TABS: 5.0000 mg | ORAL_TABLET | Freq: Once | ORAL | Status: DC | PRN

## 2021-01-04 MED ORDER — LIDOCAINE-EPINEPHRINE 1 %-1:100000 IJ SOLN
INTRAMUSCULAR | Status: AC
  Filled 2021-01-04: qty 1

## 2021-01-04 MED ORDER — DEXAMETHASONE SODIUM PHOSPHATE 10 MG/ML IJ SOLN
INTRAMUSCULAR | Status: AC
  Filled 2021-01-04: qty 1

## 2021-01-04 MED ORDER — ONDANSETRON HCL 4 MG/2ML IJ SOLN
INTRAMUSCULAR | Status: AC
  Filled 2021-01-04: qty 2

## 2021-01-04 MED ORDER — ACETAMINOPHEN 10 MG/ML IV SOLN
INTRAVENOUS | Status: AC
  Filled 2021-01-04: qty 100

## 2021-01-04 MED ORDER — FENTANYL CITRATE (PF) 250 MCG/5ML IJ SOLN
INTRAMUSCULAR | Status: AC
  Filled 2021-01-04: qty 5

## 2021-01-04 MED ORDER — BUPIVACAINE HCL (PF) 0.5 % IJ SOLN
INTRAMUSCULAR | Status: DC | PRN
  Administered 2021-01-04: 5 mL

## 2021-01-04 MED ORDER — SUGAMMADEX SODIUM 200 MG/2ML IV SOLN
INTRAVENOUS | Status: DC | PRN
  Administered 2021-01-04: 200 mg via INTRAVENOUS

## 2021-01-04 MED ORDER — LACTATED RINGERS IV SOLN: INTRAVENOUS | Status: DC

## 2021-01-04 MED ORDER — SUCCINYLCHOLINE CHLORIDE 200 MG/10ML IV SOSY
PREFILLED_SYRINGE | INTRAVENOUS | Status: AC
  Filled 2021-01-04: qty 10

## 2021-01-04 MED ORDER — LIDOCAINE 2% (20 MG/ML) 5 ML SYRINGE
INTRAMUSCULAR | Status: AC
  Filled 2021-01-04: qty 5

## 2021-01-04 MED ORDER — HYDROMORPHONE HCL 1 MG/ML IJ SOLN
INTRAMUSCULAR | Status: AC
  Filled 2021-01-04: qty 1

## 2021-01-04 MED ORDER — HYDROMORPHONE HCL 1 MG/ML IJ SOLN
0.5000 mg | INTRAMUSCULAR | Status: DC | PRN
Start: 2021-01-04 — End: 2021-01-09
  Administered 2021-01-04 – 2021-01-06 (×5): 1 mg via INTRAVENOUS
  Filled 2021-01-04 (×5): qty 1

## 2021-01-04 MED ORDER — CHLORHEXIDINE GLUCONATE 0.12 % MT SOLN: 15.0000 mL | Freq: Once | OROMUCOSAL | Status: AC

## 2021-01-04 MED ORDER — ROCURONIUM BROMIDE 10 MG/ML (PF) SYRINGE
PREFILLED_SYRINGE | INTRAVENOUS | Status: DC | PRN
  Administered 2021-01-04: 20 mg via INTRAVENOUS
  Administered 2021-01-04: 50 mg via INTRAVENOUS
  Administered 2021-01-04: 30 mg via INTRAVENOUS

## 2021-01-04 MED ORDER — EPHEDRINE 5 MG/ML INJ
INTRAVENOUS | Status: AC
  Filled 2021-01-04: qty 5

## 2021-01-04 MED ORDER — ONDANSETRON HCL 4 MG/2ML IJ SOLN
INTRAMUSCULAR | Status: DC | PRN
  Administered 2021-01-04: 4 mg via INTRAVENOUS

## 2021-01-04 MED ORDER — 0.9 % SODIUM CHLORIDE (POUR BTL) OPTIME
TOPICAL | Status: DC | PRN
  Administered 2021-01-04: 1000 mL

## 2021-01-04 MED ORDER — FUROSEMIDE 10 MG/ML IJ SOLN
40.0000 mg | Freq: Two times a day (BID) | INTRAMUSCULAR | Status: DC
  Administered 2021-01-04: 40 mg via INTRAVENOUS
  Filled 2021-01-04: qty 4

## 2021-01-04 MED ORDER — CHLORHEXIDINE GLUCONATE CLOTH 2 % EX PADS
6.0000 | MEDICATED_PAD | Freq: Every day | CUTANEOUS | Status: DC
  Administered 2021-01-04 – 2021-01-08 (×5): 6 via TOPICAL

## 2021-01-04 MED ORDER — FENTANYL CITRATE (PF) 100 MCG/2ML IJ SOLN
INTRAMUSCULAR | Status: DC | PRN
  Administered 2021-01-04: 100 ug via INTRAVENOUS
  Administered 2021-01-04 (×3): 50 ug via INTRAVENOUS

## 2021-01-04 MED ORDER — MUPIROCIN 2 % EX OINT
1.0000 "application " | TOPICAL_OINTMENT | Freq: Two times a day (BID) | CUTANEOUS | Status: AC
  Administered 2021-01-04 – 2021-01-08 (×10): 1 via NASAL
  Filled 2021-01-04 (×6): qty 22

## 2021-01-04 MED ORDER — THROMBIN 5000 UNITS EX SOLR: OROMUCOSAL | Status: DC | PRN

## 2021-01-04 MED ORDER — THROMBIN 5000 UNITS EX SOLR
CUTANEOUS | Status: AC
  Filled 2021-01-04: qty 5000

## 2021-01-04 MED ORDER — ROCURONIUM BROMIDE 10 MG/ML (PF) SYRINGE
PREFILLED_SYRINGE | INTRAVENOUS | Status: AC
  Filled 2021-01-04: qty 10

## 2021-01-04 MED ORDER — NYSTATIN 100000 UNIT/GM EX POWD
Freq: Two times a day (BID) | CUTANEOUS | Status: DC
  Filled 2021-01-04: qty 15

## 2021-01-04 MED ORDER — MIDAZOLAM HCL 2 MG/2ML IJ SOLN
0.5000 mg | Freq: Once | INTRAMUSCULAR | Status: AC | PRN
  Administered 2021-01-04: 0.5 mg via INTRAVENOUS

## 2021-01-04 MED ORDER — ORAL CARE MOUTH RINSE: 15.0000 mL | Freq: Once | OROMUCOSAL | Status: AC

## 2021-01-04 MED ORDER — ARTIFICIAL TEARS OPHTHALMIC OINT
TOPICAL_OINTMENT | OPHTHALMIC | Status: AC
  Filled 2021-01-04: qty 3.5

## 2021-01-04 MED ORDER — SODIUM CHLORIDE 0.9% IV SOLUTION: Freq: Once | INTRAVENOUS | Status: DC

## 2021-01-04 MED ORDER — OXYCODONE HCL 5 MG/5ML PO SOLN: 5.0000 mg | Freq: Once | ORAL | Status: DC | PRN

## 2021-01-04 MED ORDER — ALBUMIN HUMAN 25 % IV SOLN
25.0000 g | Freq: Four times a day (QID) | INTRAVENOUS | Status: DC
  Administered 2021-01-04 (×2): 25 g via INTRAVENOUS
  Filled 2021-01-04 (×2): qty 100

## 2021-01-04 MED ORDER — MIDAZOLAM HCL 2 MG/2ML IJ SOLN
INTRAMUSCULAR | Status: DC | PRN
  Administered 2021-01-04: 1 mg via INTRAVENOUS

## 2021-01-04 MED ORDER — CHLORHEXIDINE GLUCONATE 0.12 % MT SOLN
OROMUCOSAL | Status: AC
  Administered 2021-01-04: 15 mL via OROMUCOSAL
  Filled 2021-01-04: qty 15

## 2021-01-04 SURGICAL SUPPLY — 69 items
BAG COUNTER SPONGE SURGICOUNT (BAG) ×2 IMPLANT
BASKET BONE COLLECTION (BASKET) ×2 IMPLANT
BENZOIN TINCTURE PRP APPL 2/3 (GAUZE/BANDAGES/DRESSINGS) IMPLANT
BLADE CLIPPER SURG (BLADE) IMPLANT
BLADE SURG 11 STRL SS (BLADE) ×2 IMPLANT
BUR MATCHSTICK NEURO 3.0 LAGG (BURR) ×2 IMPLANT
BUR PRECISION FLUTE 5.0 (BURR) ×2 IMPLANT
CANISTER SUCT 3000ML PPV (MISCELLANEOUS) ×2 IMPLANT
CNTNR URN SCR LID CUP LEK RST (MISCELLANEOUS) ×1 IMPLANT
CONT SPEC 4OZ STRL OR WHT (MISCELLANEOUS) ×1
COVER BACK TABLE 60X90IN (DRAPES) ×2 IMPLANT
DECANTER SPIKE VIAL GLASS SM (MISCELLANEOUS) ×2 IMPLANT
DERMABOND ADVANCED (GAUZE/BANDAGES/DRESSINGS) ×1
DERMABOND ADVANCED .7 DNX12 (GAUZE/BANDAGES/DRESSINGS) ×1 IMPLANT
DRAIN JACKSON PRT FLT 7MM (DRAIN) IMPLANT
DRAPE C-ARM 42X72 X-RAY (DRAPES) IMPLANT
DRAPE C-ARMOR (DRAPES) IMPLANT
DRAPE LAPAROTOMY 100X72X124 (DRAPES) ×2 IMPLANT
DRAPE SURG 17X23 STRL (DRAPES) ×2 IMPLANT
DURAPREP 26ML APPLICATOR (WOUND CARE) ×3 IMPLANT
ELECT REM PT RETURN 9FT ADLT (ELECTROSURGICAL) ×2
ELECTRODE REM PT RTRN 9FT ADLT (ELECTROSURGICAL) ×1 IMPLANT
GAUZE 4X4 16PLY ~~LOC~~+RFID DBL (SPONGE) ×1 IMPLANT
GAUZE SPONGE 4X4 12PLY STRL (GAUZE/BANDAGES/DRESSINGS) IMPLANT
GLOVE EXAM NITRILE LRG STRL (GLOVE) IMPLANT
GLOVE EXAM NITRILE XL STR (GLOVE) IMPLANT
GLOVE EXAM NITRILE XS STR PU (GLOVE) IMPLANT
GLOVE SURG ENC MOIS LTX SZ7 (GLOVE) ×1 IMPLANT
GLOVE SURG LTX SZ7.5 (GLOVE) ×4 IMPLANT
GLOVE SURG UNDER POLY LF SZ7.5 (GLOVE) ×5 IMPLANT
GOWN STRL REUS W/ TWL LRG LVL3 (GOWN DISPOSABLE) ×4 IMPLANT
GOWN STRL REUS W/ TWL XL LVL3 (GOWN DISPOSABLE) IMPLANT
GOWN STRL REUS W/TWL 2XL LVL3 (GOWN DISPOSABLE) IMPLANT
GOWN STRL REUS W/TWL LRG LVL3 (GOWN DISPOSABLE) ×4
GOWN STRL REUS W/TWL XL LVL3 (GOWN DISPOSABLE)
GRAFT BN 10X1XDBM MAGNIFUSE (Bone Implant) IMPLANT
GRAFT BONE MAGNIFUSE 1X10CM (Bone Implant) ×1 IMPLANT
HEMOSTAT POWDER KIT SURGIFOAM (HEMOSTASIS) ×2 IMPLANT
KIT BASIN OR (CUSTOM PROCEDURE TRAY) ×2 IMPLANT
KIT POSITION SURG JACKSON T1 (MISCELLANEOUS) ×2 IMPLANT
KIT TURNOVER KIT B (KITS) ×2 IMPLANT
MILL MEDIUM DISP (BLADE) ×2 IMPLANT
NDL HYPO 18GX1.5 BLUNT FILL (NEEDLE) IMPLANT
NDL SPNL 18GX3.5 QUINCKE PK (NEEDLE) IMPLANT
NEEDLE HYPO 18GX1.5 BLUNT FILL (NEEDLE) IMPLANT
NEEDLE HYPO 22GX1.5 SAFETY (NEEDLE) ×2 IMPLANT
NEEDLE SPNL 18GX3.5 QUINCKE PK (NEEDLE) IMPLANT
NS IRRIG 1000ML POUR BTL (IV SOLUTION) ×2 IMPLANT
PACK LAMINECTOMY NEURO (CUSTOM PROCEDURE TRAY) ×2 IMPLANT
PAD ARMBOARD 7.5X6 YLW CONV (MISCELLANEOUS) ×6 IMPLANT
ROD SPINAL TI CP4 NS 5.5X120 (Rod) ×2 IMPLANT
SCREW CANN MAS 7.5X70 (Screw) ×2 IMPLANT
SCREW SET SOLERA (Screw) ×10 IMPLANT
SCREW SET SOLERA TI5.5 (Screw) IMPLANT
SCREW SOLERA 30X6.5XMA NS SPNE (Screw) IMPLANT
SCREW SOLERA 6.5X30MM (Screw) ×2 IMPLANT
SCREW SOLERA 6.5X35MM (Screw) ×6 IMPLANT
SPONGE SURGIFOAM ABS GEL 100 (HEMOSTASIS) IMPLANT
SPONGE T-LAP 4X18 ~~LOC~~+RFID (SPONGE) IMPLANT
STRIP CLOSURE SKIN 1/2X4 (GAUZE/BANDAGES/DRESSINGS) IMPLANT
SUT MNCRL AB 3-0 PS2 18 (SUTURE) ×2 IMPLANT
SUT VIC AB 0 CT1 18XCR BRD8 (SUTURE) ×1 IMPLANT
SUT VIC AB 0 CT1 8-18 (SUTURE) ×1
SUT VIC AB 2-0 CP2 18 (SUTURE) ×2 IMPLANT
SYR 3ML LL SCALE MARK (SYRINGE) IMPLANT
TOWEL GREEN STERILE (TOWEL DISPOSABLE) ×2 IMPLANT
TOWEL GREEN STERILE FF (TOWEL DISPOSABLE) ×2 IMPLANT
TRAY FOLEY MTR SLVR 16FR STAT (SET/KITS/TRAYS/PACK) ×2 IMPLANT
WATER STERILE IRR 1000ML POUR (IV SOLUTION) ×2 IMPLANT

## 2021-01-04 NOTE — Transfer of Care (Signed)
Immediate Anesthesia Transfer of Care Note  Patient: Jillian Leblanc  Procedure(s) Performed: Lumbar four, lumbar five laminectomies; Lumbar three - pelvis posterolateral instrumented fusion (Spine Lumbar)  Patient Location: PACU  Anesthesia Type:General  Level of Consciousness: drowsy  Airway & Oxygen Therapy: Patient Spontanous Breathing and Patient connected to face mask oxygen  Post-op Assessment: Report given to RN and Post -op Vital signs reviewed and stable  Post vital signs: Reviewed and stable  Last Vitals:  Vitals Value Taken Time  BP 104/68 01/04/21 2221  Temp    Pulse 88 01/04/21 2225  Resp 21 01/04/21 2225  SpO2 100 % 01/04/21 2225  Vitals shown include unvalidated device data.  Last Pain:  Vitals:   01/04/21 1706  TempSrc: Oral  PainSc: 0-No pain      Patients Stated Pain Goal: 2 (01/02/21 2100)  Complications: No notable events documented.

## 2021-01-04 NOTE — Consult Note (Signed)
WOC Nurse wound consult note Consultation was completed by review of records, images and assistance from the bedside nurse/clinical staff.  Reason for Consult: moisture associated skin damage/skin folds Consult ordered for interdry, however bedside nursing does not feel the need for this at this time. Skin folds are clean and not open.  Re consult if needed, will not follow at this time. Thanks  Valborg Friar [employer], RN,CWOCN, CNS, CWON-AP [phone])

## 2021-01-04 NOTE — Op Note (Signed)
PATIENT: Jillian Leblanc  DAY OF SURGERY: 01/04/21   PRE-OPERATIVE DIAGNOSIS:  Lumbar osteomyelitis with pathologic fracture, lumbar epidural abscess   POST-OPERATIVE DIAGNOSIS:  Same   PROCEDURE:  L4, L5 laminectomies, evacuation of epidural abscess, L3 to S2 / pelvis posterolateral instrumented fusion   SURGEON:  Surgeon(s) and Role:    Jadene Pierini, MD - Primary   ANESTHESIA: ETGA   BRIEF HISTORY: This is a 75 year old woman who presented with severe low back pain. She had a complex course. She is unable to get an MRI due to a stimulator lead. A lumbar CT showed osteomyelitis with epidural gas formation concerning for epidural abscess. She was initially septic and too ill for operative intervention. She had antibiotics and improved systemically, however her back pain suddenly progressed and she had new urinary retention. A repeat CT showed worsening osteomyelitis and collapse of the L5 vertebral body. I discussed this difficult situation with the patient and her family. I was clear that I am quite concerned regarding her overall poor health and fragility. However, with new urinary retention and loss of ambulation due to pain, I think she will do quite poorly if she is unable to sit up in bed or ambulate, I thus recommended surgery to evacuate abscess, decompress her thecal sac, and stabilize her pathologic fracture.    OPERATIVE DETAIL: The patient was taken to the operating room and anesthesia was induced by the anesthesia team. They were placed on the OR table in the prone position with padding of all pressure points. A formal time out was performed with two patient identifiers and confirmed the operative site. The operative site was marked, hair was clipped with surgical clippers, the area was then prepped and draped in a sterile fashion. Fluoro was used to localize the operative level and a midline incision was placed to expose from L3 to the S2 dorsal foramen. Subperiosteal dissection  was performed bilaterally and fluoroscopy was again used to confirm the surgical level.   Decompression was performed, which consisted of L4 and L5 laminectomies. These were performed with a combination of a high speed drill and rongeurs. As soon as I removed some lamina, there was diffuse purulent material present, which was sent for cultures. There was a large ventral epidural abscess that I evacuated that tracked into the L5 vertebral body, or the remnants of it until the thecal sac was well decompressed  Instrumentation was then performed. Fluoroscopy was used to guide placement of bilateral pedicle screws ([employer]tronic) at L4, L5, and S1. Given the strength of the bone during placement and the construct requirements, as well as the patient's inability to tolerate an anterior reconstruction, I decided to also place bilateral pedicle screws at L3 as well. These were placed by localizing the pedicle with standard landmarks, drilling a pilot hole, cannulating the pedicle with an awl, palpating for pedicle wall breaches, tapping, palpating, and then placing the screw.   S2-A-I screws were then placed bilaterally. These were placed by creating a pilot hole just lateral to the midpoint of the dorsal S1 and S2 foramina, then fluorosocopy was used with a 'teardrop view' to advance a pedicle awl along the trajectory with attention to avoid the sciatic notch. Lateral and AP views showed good position, the tracts were measured and bilateral screws were placed.  The facets were decorticated bilaterally at L3-4, L4-5, L5-S1 and onto the sacrum and bone graft These were connected with rods bilaterally and final tightened according to manufacturer torque specifications. The  bone was thoroughly decorticated over the fusion surface with the addition of magnafuse allograft (Medtronic) for fusion.  Hemostasis was obtained and confirmed, final xrays showed hardware in position, the wound was copiously irrigated, all  instrument and sponge counts were correct, the incision was then closed in layers. The patient was then returned to anesthesia for emergence. No apparent complications at the completion of the procedure.   EBL:  421mL   DRAINS: none   SPECIMENS: none   Judith Part, MD 01/04/21 5:56 PM

## 2021-01-04 NOTE — Progress Notes (Signed)
Discussed w/ primary team, pt has had worsening back pain, new urinary retention, CT L-spine was repeated, which shows further destruction of the L5 vertebral body with significant distention of the bladder. On exam, the patient is laying in bed, miserable with pain with any motion. She does have movement o fher lower extremities, it is very difficult to get any dorsiflexion or EHL movement but I was able to get at least 3/5 in those groups, 4-/5 in PF, proximally she has movement but it is very pain limited with muscle spasms during any movement of the proximal BLE. BUE strength is 5/5.   I discussed with the patient and then her daughter via telephone. This is, again, a difficult situation. With her new urinary retention and leg weakness, I'm worried about cauda equina s/d. Additionally, with her pain this severe, she is certainly not going to be able to sit up in bed, let alone walk. But, given her high frailty and poor overall health, I'm concerned about her recovery post-op. I discussed this at length with her daughter, who understood the seriousness of the situation, but she and the patient agreed that they would like to proceed with surgery. Keep NPO, will take to the OR this afternoon / tonight for decompression and instrumentation.

## 2021-01-04 NOTE — Progress Notes (Signed)
[facility] NEPHROLOGY PROGRESS NOTE  Assessment/ Plan: Pt is a 75 y.o. yo female  with a PMH of DM2, hypothryoidism, HFpEF, sinus pauses s/p PPM here with septick shock 2/2 E. Coli bacteremia along with vertebral osteomyelitis L4-S1 Nephrology was c/s due to patients hypokalemia and AKI.    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  The acute worsening in her sodium is perhaps due to hypervolemia in the setting of fluid resuscitation 2/2 sepsis. Patient was noted to have peripheral edema and worsened sodium level with IV fluid yesterday.  TSH elevated but improved from prior    Patient's sodium improved with IV albumin x3 and lasix 40mg  x2. Repeat urine sodium 13 and Uosm 361. Indicative of SIADH and decreased EABV.  -Continue fluid restriction, continue IV albumin for now. If patient requires colloid in the future would consider transfusion -Continue IV lasix 40mg  x2 today -F/u AM cortisol     #Acute kidney injury, nonoliguric: Patient had good urine output however decline overnight.  AKI likely hemodynamically mediated with decreased intravascular volume/third spacing concomitant with prolonged hospitalization with complicated course including septic shock/UTI.  CT scan ruled out hydronephrosis.  I will order bladder scan.  Lasix as discussed above.  Strict ins and out and daily lab.   #Acute metabolic encephalopathy: With long hospitalization, likely ongoing delirium.  Do not think it is caused by hyponatremia.  Patient was alert awake and uncomfortable this morning.    #Sepsis shock due to E. coli bacteremia, L4 S1 osteomyelitis with suspected epidural abscess: Currently on IV Ancef per ID.  Pain management.   #Metabolic acidosis: Resolved after starting oral sodium bicarbonate. Will hold these today.    #Hypokalemia: Potassium level  improved.   Subjective:   NAEO. Patient reportedly had an episode of urinary retention yesterday. Able to spontaneously urinate today. Continues to note back pain and also pain of the skin overlying the L hip.  Objective Vital signs in last 24 hours: Vitals:   01/04/21 0500 01/04/21 0752 01/04/21 0800 01/04/21 1000  BP:  (!) 104/59 (!) 98/59 110/82  Pulse:  83 82 93  Resp:  15 19 16   Temp:  98.7 F (37.1 C)    TempSrc:  Oral    SpO2:  97% 93% 95%  Weight: 88 kg     Height:       Weight change:   Intake/Output Summary (Last 24 hours) at 01/04/2021 1113 Last data filed at 01/04/2021 0525 Gross per 24 hour  Intake 540 ml  Output 2500 ml  Net -1960 ml     Labs: Basic Metabolic Panel: Recent Labs  Lab 01/03/21 0706 01/03/21 1621 01/04/21 0508  NA 119* 122* 123*  K 4.2 3.7 3.6  CL 86* 89* 87*  CO2 19* 23 26  GLUCOSE 165* 126* 113*  BUN 19 22 19   CREATININE 1.30* 1.35* 1.10*  CALCIUM 8.0* 8.0* 8.1*  PHOS  --  4.5 3.7   Liver Function Tests: Recent Labs  Lab 01/03/21 1621 01/04/21 0508  ALBUMIN 2.7* 2.5*   No results for input(s): LIPASE, AMYLASE in the last 168 hours. Recent Labs  Lab 01/03/21 0706  AMMONIA 21   CBC: Recent Labs  Lab 12/30/20 0609 12/31/20 0222 01/03/21 0706 01/04/21 0508  WBC 7.7 6.6 9.8 6.1  HGB 7.6* 7.8* 7.8* 7.3*  HCT 23.4* 24.4* 23.6* 22.3*  MCV 88.3 91.7 87.4 88.5  PLT 251 222 294 242   Cardiac Enzymes: No results for input(s): CKTOTAL, CKMB, CKMBINDEX, TROPONINI in the last 168 hours. CBG: Recent Labs  Lab 01/03/21 1554 01/03/21 2054 01/04/21 0022 01/04/21 0527 01/04/21 0754  GLUCAP 127* 110* 127* 113* 96    Iron Studies: No results for input(s): IRON, TIBC, TRANSFERRIN, FERRITIN in the last 72 hours. Studies/Results: CT ELBOW LEFT WO CONTRAST  Result Date: 01/02/2021 CLINICAL DATA:  Soft tissue infection suspected, elbow, xray done E. coli bacteremia with lumbar spine osteo-/epidural abscess-with persistent  left elbow pain. EXAM: CT OF THE UPPER LEFT EXTREMITY WITHOUT CONTRAST TECHNIQUE: Multidetector CT imaging of the upper left extremity was performed according to the standard protocol. COMPARISON:  X-ray 12/30/2020 FINDINGS: Technical note: Examination is limited secondary to beam hardening artifact related to patient positioning of the elbow adjacent to the abdominal wall. Elbow is flexed resulting in nonstandard imaging planes. Bones/Joint/Cartilage No acute fracture. No dislocation. Elbow joint spaces are preserved. No erosion or periosteal elevation. No appreciable elbow joint effusion. Ligaments Suboptimally assessed by CT. Muscles and Tendons Evaluation of the musculotendinous structures is significantly limited. No obvious intramuscular fluid collection. Soft tissues Soft tissue edema with ill-defined fluid most pronounced along the ulnar aspect of the proximal forearm. No organized fluid collection is evident by noncontrast CT. No soft tissue gas. Catheter tubing is visualized within the upper arm. IMPRESSION: 1. Limited exam. 2. Soft tissue edema with ill-defined fluid most pronounced along the ulnar aspect of the proximal forearm, suggesting cellulitis. No organized fluid collection is evident. 3. No appreciable elbow joint effusion to suggest septic arthritis. 4. No acute osseous abnormality. Electronically Signed   By: Davina Poke D.O.   On: 01/02/2021 17:17    Medications: Infusions:  sodium chloride Stopped (12/21/20 1240)   sodium chloride     albumin human 25 g (01/04/21 0839)    ceFAZolin (ANCEF) IV 2 g (01/04/21 0908)    Scheduled Medications:  acetaminophen  650 mg Oral Once   arformoterol  15 mcg Nebulization BID   calcium carbonate  800 mg of elemental calcium Oral BID   calcium-vitamin D  0.5 tablet Oral BID   fentaNYL (SUBLIMAZE) injection  100 mcg Intravenous Once   fluticasone  1 spray Each Nare Daily   furosemide  40 mg Intravenous Q12H   gabapentin  300 mg Oral BID    heparin injection (subcutaneous)  5,000 Units Subcutaneous Q8H   insulin aspart  0-15 Units Subcutaneous Q4H   levothyroxine  150 mcg Oral Q0600   lidocaine  1 patch Transdermal Daily   loratadine  10 mg Oral Daily   mouth rinse  15 mL Mouth Rinse BID   nystatin   Topical BID   pantoprazole  40 mg Oral Daily   polyethylene glycol  17 g Oral Daily   pramipexole  1.5 mg Oral TID   revefenacin  175 mcg Nebulization Daily   sodium bicarbonate  650 mg Oral TID   sodium chloride flush  10-40 mL Intracatheter Q12H   sodium chloride  1 g Oral TID WC    have reviewed scheduled and prn medications.  Physical Exam: General:Uncomfortable sitting up in bed Heart:RRR, s1s2 nl Lungs:clear to auscultation bilaterally b/l, no crackles Abdomen:soft, obese, Non-tender, non-distended, lower abdominal folds with erythema, no pus  Extremities:1+ pitting edema of BLE up to the knee  Dialysis Access: None  Rick Duff, MD Internal Medicine Resident PGY-2 01/04/2021,11:13 AM  LOS: 17 days

## 2021-01-04 NOTE — Anesthesia Procedure Notes (Signed)
Procedure Name: Intubation Date/Time: 01/04/2021 5:54 PM Performed by: Moshe Salisbury, CRNA Pre-anesthesia Checklist: Patient identified, Emergency Drugs available, Suction available and Patient being monitored Patient Re-evaluated:Patient Re-evaluated prior to induction Oxygen Delivery Method: Circle System Utilized Preoxygenation: Pre-oxygenation with 100% oxygen Induction Type: IV induction Ventilation: Mask ventilation without difficulty Laryngoscope Size: Mac and 3 Grade View: Grade II Tube type: Oral Tube size: 7.0 mm Number of attempts: 1 Airway Equipment and Method: Stylet Placement Confirmation: ETT inserted through vocal cords under direct vision, positive ETCO2 and breath sounds checked- equal and bilateral Secured at: 21 cm Tube secured with: Tape Dental Injury: Teeth and Oropharynx as per pre-operative assessment

## 2021-01-04 NOTE — Anesthesia Preprocedure Evaluation (Addendum)
Anesthesia Evaluation  Patient identified by MRN, date of birth, ID band Patient awake and Patient confused    Reviewed: Allergy & Precautions, NPO status , Patient's Chart, lab work & pertinent test results, Unable to perform ROS - Chart review only  History of Anesthesia Complications Negative for: history of anesthetic complications  Airway Mallampati: II  TM Distance: >3 FB Neck ROM: Full    Dental  (+) Dental Advisory Given, Poor Dentition   Pulmonary  12/29/2020 SARS coronavirus NEG   breath sounds clear to auscultation       Cardiovascular hypertension, Pt. on medications (-) angina+ dysrhythmias Atrial Fibrillation + pacemaker  Rhythm:Regular Rate:Normal  11/24/2020 ECHO: EF 60 to 65%. The LV has normal function, no regional wall motion abnormalities. Grade I DD, no significant valvular abnormalities  12/21/2020 limited ECHO: mod depressed LVF, trivial MR, mod TR   Neuro/Psych Chronic back pain Now with e.coli sepsis and epidural abscess/osteomyelitis    GI/Hepatic negative GI ROS, Neg liver ROS,   Endo/Other  diabetes (glu 92), Oral Hypoglycemic AgentsHypothyroidism Morbid obesity  Renal/GU Renal InsufficiencyRenal disease     Musculoskeletal   Abdominal (+) + obese,   Peds  Hematology  (+) Blood dyscrasia (Hb 7.3), anemia ,   Anesthesia Other Findings   Reproductive/Obstetrics                            Anesthesia Physical Anesthesia Plan  ASA: 4  Anesthesia Plan: General   Post-op Pain Management: Dilaudid IV and Tylenol PO (pre-op)   Induction: Intravenous  PONV Risk Score and Plan: 3 and Ondansetron, Dexamethasone and Treatment may vary due to age or medical condition  Airway Management Planned: Oral ETT  Additional Equipment: None  Intra-op Plan:   Post-operative Plan: Extubation in OR  Informed Consent: I have reviewed the patients History and Physical, chart,  labs and discussed the procedure including the risks, benefits and alternatives for the proposed anesthesia with the patient or authorized representative who has indicated his/her understanding and acceptance.     Dental advisory given and Consent reviewed with POA  Plan Discussed with: CRNA and Surgeon  Anesthesia Plan Comments: (Discussed with pt's daughter, [name], by telephone, including but not limited to, transfusion, confusion, periop intubation/ventilation)       Anesthesia Quick Evaluation

## 2021-01-04 NOTE — Progress Notes (Addendum)
PROGRESS NOTE        PATIENT DETAILS Name: Jillian Leblanc Age: 75 y.o. Sex: female Date of Birth: 1945-10-12 Admit Date: 12/18/2020 Admitting Physician Angie Fava, DO PJA:[id number], Idelia Salm, MD  Brief Narrative: [name] a 75 y.o. 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 coli bacteremia-along with vertebral osteomyelitis involving L4-S1.  Initially managed in the ICU-upon stability-transferred to [facility].   Subjective: Continues to complain of persistent back pain-appears uncomfortable.  Urinating spontaneously today-but yesterday had an episode of urinary retention.  Objective: Vitals: Blood pressure 110/82, pulse 93, temperature 98.7 F (37.1 C), temperature source Oral, resp. rate 16, height 5' (1.524 m), weight 88 kg, SpO2 95 %.   Exam: Gen Exam:Alert awake-not in any distress HEENT:atraumatic, normocephalic Chest: B/L clear to auscultation anteriorly CVS:S1S2 regular Abdomen:soft non tender, non distended Extremities:no edema Neurology: Difficult exam due to pain-moving both lower extremities but seems to be slightly weaker than the past few days.   Skin: no rash     Pertinent Labs/Radiology: Recent Labs  Lab 01/04/21 0508  WBC 6.1  HGB 7.3*  PLT 242  NA 123*  K 3.6  CREATININE 1.10*      Assessment/Plan: Septic shock due to E. coli bacteremia-L4-S1 osteomyelitis with suspected epidural abscess: Sepsis physiology has resolved-did have 1 episode of fever on 12/6-and none since then-repeat cultures were negative.  She has remained on IV Ancef-unfortunately-since 12/11-has developed worsening back pain-pain limits neurological exam but some concern that she may be slightly weaker than what she was.  Keep n.p.o.-stat CT of LS spine  ordered-Case has been discussed with Dr. Johnsie Cancel who will reevaluate.    Acute metabolic encephalopathy: Due to septic shock/AKI/hospital delirium-overall-mental status has improved-has had some intermittent delirium-due to pain and narcotics.  This morning she appears uncomfortable but awake and alert.     Acute hypoxic respiratory failure: Multifactorial etiology-due to asthma exacerbation and pulmonary edema.  Continues to clinically improve-with supportive care and diuretic regimen-on just 1 L of oxygen.  No longer on steroids.    Hyponatremia: Due to volume overload-sodium levels now improving-with a combination of Lasix/albumin and salt tablets.  Nephrology following.  Continue to follow closely.    Hypokalemia: Repleted.  AKI: Mild-felt to be due to intravascular volume depletion-responding well to IV albumin/Lasix combination.  PAF with RVR: Maintaining sinus rhythm-initially required amiodarone-reviewed cardiology note-anticoagulation issue to be addressed upon outpatient follow-up.  Thrombocytopenia: Resolved-likely due to sepsis.  Anemia: Multifactorial etiology-but mostly due to acute illness-no evidence of blood loss-follow closely and transfuse if Hb <7.  AKI: Mild-felt to be due to intravascular volume depletion-starting albumin/Lasix-see below.  HFpEF: volume overload is due to hypoalbuminemia/third spacing rather than decompensated heart failure.  On IV Lasix in any event.  DM-2 (A1c 8.27 October 2020): CBGs stable-no longer on Levemir-continue SSI and follow trend.    Recent Labs    01/04/21 0022 01/04/21 0527 01/04/21 0754  GLUCAP 127* 113* 96     Hypothyroidism: Continue Synthroid-both TSH/free T4 elevated on recent labs-likely due to sick euthyroid syndrome-recommendations are to repeat thyroid function test in a few weeks.  GERD: Continue PPI  Dysphagia: Tolerating dysphagia 3 diet-I suspect this is from deconditioning.  SLP following.  Back pain: Due to  discitis-prior compression fractures.  She has developed persistently worsening back pain in the past 24 hours-see above-awaiting repeat CT scan in neurosurgical reevaluation.  Left elbow pain: Seems to have improved-CT elbow on 12/10-does not show any collection-shows cellulitis.  Already on IV Ancef.  Dopplers negative for DVT as well.  For now supportive care.   Morbid Obesity Estimated body mass index is 37.89 kg/m as calculated from the following:   Height as of this encounter: 5' (1.524 m).   Weight as of this encounter: 88 kg.    Procedures: None Consults:PCCM, ID, neurosurgery DVT Prophylaxis: Heparin Code Status:Full code  Family Communication: [id number] updated over the phone on 12/12.  Time spent: 25 minutes-Greater than 50% of this time was spent in counseling, explanation of diagnosis, planning of further management, and coordination of care.   Disposition Plan: Status is: Inpatient  Remains inpatient appropriate because: Bacteremia-epidural abscess-lumbar osteomyelitis-febrile-hyponatremia-not yet stable for discharge.    Diet: Diet Order             Diet NPO time specified  Diet effective now                     Antimicrobial agents: Anti-infectives (From admission, onward)    Start     Dose/Rate Route Frequency Ordered Stop   12/21/20 1800  ceFAZolin (ANCEF) IVPB 2g/100 mL premix        2 g 200 mL/hr over 30 Minutes Intravenous Every 8 hours 12/21/20 1216     12/20/20 1800  cefTRIAXone (ROCEPHIN) 2 g in sodium chloride 0.9 % 100 mL IVPB  Status:  Discontinued        2 g 200 mL/hr over 30 Minutes Intravenous Every 24 hours 12/20/20 0920 12/21/20 1216   12/20/20 0100  meropenem (MERREM) 1 g in sodium chloride 0.9 % 100 mL IVPB  Status:  Discontinued        1 g 200 mL/hr over 30 Minutes Intravenous Every 8 hours 12/20/20 0004 12/20/20 0920   12/19/20 2000  ceFEPIme (MAXIPIME) 2 g in sodium chloride 0.9 % 100 mL IVPB  Status:   Discontinued        2 g 200 mL/hr over 30 Minutes Intravenous Every 24 hours 12/19/20 0057 12/19/20 1023   12/19/20 2000  cefTRIAXone (ROCEPHIN) 2 g in sodium chloride 0.9 % 100 mL IVPB  Status:  Discontinued        2 g 200 mL/hr over 30 Minutes Intravenous Every 24 hours 12/19/20 1023 12/20/20 0004   12/19/20 0057  vancomycin variable dose per unstable renal function (pharmacist dosing)  Status:  Discontinued         Does not apply See admin instructions 12/19/20 0057 12/19/20 1023   12/18/20 2000  ceFEPIme (MAXIPIME) 2 g in sodium chloride 0.9 % 100 mL IVPB        2 g 200 mL/hr over 30 Minutes Intravenous  Once 12/18/20 1955 12/18/20 2050   12/18/20 2000  metroNIDAZOLE (FLAGYL) IVPB 500 mg        500 mg 100 mL/hr over 60 Minutes Intravenous  Once 12/18/20 1955 12/18/20 2210   12/18/20 2000  vancomycin (VANCOREADY) IVPB 1750 mg/350 mL        1,750 mg 175 mL/hr over 120 Minutes Intravenous  Once 12/18/20 1955 12/19/20 0045        MEDICATIONS: Scheduled Meds:  acetaminophen  650 mg Oral Once   arformoterol  15 mcg Nebulization BID  calcium carbonate  800 mg of elemental calcium Oral BID   calcium-vitamin D  0.5 tablet Oral BID   fentaNYL (SUBLIMAZE) injection  100 mcg Intravenous Once   fluticasone  1 spray Each Nare Daily   furosemide  40 mg Intravenous Q12H   gabapentin  300 mg Oral BID   heparin injection (subcutaneous)  5,000 Units Subcutaneous Q8H   insulin aspart  0-15 Units Subcutaneous Q4H   levothyroxine  150 mcg Oral Q0600   lidocaine  1 patch Transdermal Daily   loratadine  10 mg Oral Daily   mouth rinse  15 mL Mouth Rinse BID   nystatin   Topical BID   pantoprazole  40 mg Oral Daily   polyethylene glycol  17 g Oral Daily   pramipexole  1.5 mg Oral TID   revefenacin  175 mcg Nebulization Daily   sodium bicarbonate  650 mg Oral TID   sodium chloride flush  10-40 mL Intracatheter Q12H   sodium chloride  1 g Oral TID WC   Continuous Infusions:  sodium chloride  Stopped (12/21/20 1240)   sodium chloride     albumin human 25 g (01/04/21 0839)    ceFAZolin (ANCEF) IV 2 g (01/04/21 0908)   PRN Meds:.sodium chloride, acetaminophen **OR** acetaminophen, benzonatate, dextromethorphan, docusate sodium, fentaNYL (SUBLIMAZE) injection, guaiFENesin, hydrOXYzine, ipratropium-albuterol, lip balm, methocarbamol, morphine injection, naLOXone (NARCAN)  injection, oxyCODONE, phenol, sodium chloride flush   I have personally reviewed following labs and imaging studies  LABORATORY DATA: CBC: Recent Labs  Lab 12/30/20 0609 12/31/20 0222 01/03/21 0706 01/04/21 0508  WBC 7.7 6.6 9.8 6.1  HGB 7.6* 7.8* 7.8* 7.3*  HCT 23.4* 24.4* 23.6* 22.3*  MCV 88.3 91.7 87.4 88.5  PLT 251 222 294 242     Basic Metabolic Panel: Recent Labs  Lab 01/01/21 0243 01/02/21 0108 01/03/21 0706 01/03/21 1621 01/04/21 0508  NA 124* 125* 119* 122* 123*  K 3.3* 3.9 4.2 3.7 3.6  CL 85* 87* 86* 89* 87*  CO2 27 29 19* 23 26  GLUCOSE 117* 136* 165* 126* 113*  BUN 12 12 19 22 19   CREATININE 1.14* 1.05* 1.30* 1.35* 1.10*  CALCIUM 7.6* 8.4* 8.0* 8.0* 8.1*  PHOS  --   --   --  4.5 3.7     GFR: Estimated Creatinine Clearance: 44.9 mL/min (A) (by C-G formula based on SCr of 1.1 mg/dL (H)).  Liver Function Tests: Recent Labs  Lab 01/03/21 1621 01/04/21 0508  ALBUMIN 2.7* 2.5*   No results for input(s): LIPASE, AMYLASE in the last 168 hours. Recent Labs  Lab 01/03/21 0706  AMMONIA 21     Coagulation Profile: No results for input(s): INR, PROTIME in the last 168 hours.  Cardiac Enzymes: No results for input(s): CKTOTAL, CKMB, CKMBINDEX, TROPONINI in the last 168 hours.  BNP (last 3 results) No results for input(s): PROBNP in the last 8760 hours.  Lipid Profile: No results for input(s): CHOL, HDL, LDLCALC, TRIG, CHOLHDL, LDLDIRECT in the last 72 hours.  Thyroid Function Tests: No results for input(s): TSH, T4TOTAL, FREET4, T3FREE, THYROIDAB in the last 72  hours.  Anemia Panel: No results for input(s): VITAMINB12, FOLATE, FERRITIN, TIBC, IRON, RETICCTPCT in the last 72 hours.  Urine analysis:    Component Value Date/Time   COLORURINE YELLOW 12/30/2020 1022   APPEARANCEUR CLEAR 12/30/2020 1022   LABSPEC <1.005 (L) 12/30/2020 1022   PHURINE 7.0 12/30/2020 1022   GLUCOSEU NEGATIVE 12/30/2020 1022   HGBUR MODERATE (A) 12/30/2020 1022  BILIRUBINUR NEGATIVE 12/30/2020 1022   KETONESUR NEGATIVE 12/30/2020 1022   PROTEINUR NEGATIVE 12/30/2020 1022   UROBILINOGEN 0.2 09/15/2012 1536   NITRITE NEGATIVE 12/30/2020 1022   LEUKOCYTESUR MODERATE (A) 12/30/2020 1022    Sepsis Labs: Lactic Acid, Venous    Component Value Date/Time   LATICACIDVEN 1.5 12/20/2020 0429    MICROBIOLOGY: Recent Results (from the past 240 hour(s))  Resp Panel by RT-PCR (Flu A&B, Covid) Nasopharyngeal Swab     Status: None   Collection Time: 12/29/20  3:29 PM   Specimen: Nasopharyngeal Swab; Nasopharyngeal(NP) swabs in vial transport medium  Result Value Ref Range Status   SARS Coronavirus 2 by RT PCR NEGATIVE NEGATIVE Final    Comment: (NOTE) SARS-CoV-2 target nucleic acids are NOT DETECTED.  The SARS-CoV-2 RNA is generally detectable in upper respiratory specimens during the acute phase of infection. The lowest concentration of SARS-CoV-2 viral copies this assay can detect is 138 copies/mL. A negative result does not preclude SARS-Cov-2 infection and should not be used as the sole basis for treatment or other patient management decisions. A negative result may occur with  improper specimen collection/handling, submission of specimen other than nasopharyngeal swab, presence of viral mutation(s) within the areas targeted by this assay, and inadequate number of viral copies(<138 copies/mL). A negative result must be combined with clinical observations, patient history, and epidemiological information. The expected result is Negative.  Fact Sheet for  Patients:  EntrepreneurPulse.com.au  Fact Sheet for Healthcare Providers:  IncredibleEmployment.be  This test is no t yet approved or cleared by the [place] FDA and  has been authorized for detection and/or diagnosis of SARS-CoV-2 by FDA under an Emergency Use Authorization (EUA). This EUA will remain  in effect (meaning this test can be used) for the duration of the COVID-19 declaration under Section 564(b)(1) of the Act, 21 U.S.C.section 360bbb-3(b)(1), unless the authorization is terminated  or revoked sooner.       Influenza A by PCR NEGATIVE NEGATIVE Final   Influenza B by PCR NEGATIVE NEGATIVE Final    Comment: (NOTE) The Xpert Xpress SARS-CoV-2/FLU/RSV plus assay is intended as an aid in the diagnosis of influenza from Nasopharyngeal swab specimens and should not be used as a sole basis for treatment. Nasal washings and aspirates are unacceptable for Xpert Xpress SARS-CoV-2/FLU/RSV testing.  Fact Sheet for Patients: EntrepreneurPulse.com.au  Fact Sheet for Healthcare Providers: IncredibleEmployment.be  This test is not yet approved or cleared by the [place] FDA and has been authorized for detection and/or diagnosis of SARS-CoV-2 by FDA under an Emergency Use Authorization (EUA). This EUA will remain in effect (meaning this test can be used) for the duration of the COVID-19 declaration under Section 564(b)(1) of the Act, 21 U.S.C. section 360bbb-3(b)(1), unless the authorization is terminated or revoked.  Performed at [facility], [place] [address], [place], [place] [address]   Culture, blood (routine x 2)     Status: None   Collection Time: 12/30/20 11:33 AM   Specimen: BLOOD RIGHT HAND  Result Value Ref Range Status   Specimen Description BLOOD RIGHT HAND  Final   Special Requests   Final    BOTTLES DRAWN AEROBIC AND ANAEROBIC Blood Culture adequate volume   Culture   Final     NO GROWTH 5 DAYS Performed at [facility], [place] [address], [place], [place] [address]    Report Status 01/04/2021 FINAL  Final  Culture, blood (routine x 2)     Status: None  Collection Time: 12/30/20 11:43 AM   Specimen: BLOOD RIGHT HAND  Result Value Ref Range Status   Specimen Description BLOOD RIGHT HAND  Final   Special Requests   Final    BOTTLES DRAWN AEROBIC AND ANAEROBIC Blood Culture adequate volume   Culture   Final    NO GROWTH 5 DAYS Performed at [facility], [address] [place], [place] [address]    Report Status 01/04/2021 FINAL  Final    RADIOLOGY STUDIES/RESULTS: CT ELBOW LEFT WO CONTRAST  Result Date: 01/02/2021 CLINICAL DATA:  Soft tissue infection suspected, elbow, xray done E. coli bacteremia with lumbar spine osteo-/epidural abscess-with persistent left elbow pain. EXAM: CT OF THE UPPER LEFT EXTREMITY WITHOUT CONTRAST TECHNIQUE: Multidetector CT imaging of the upper left extremity was performed according to the standard protocol. COMPARISON:  X-ray 12/30/2020 FINDINGS: Technical note: Examination is limited secondary to beam hardening artifact related to patient positioning of the elbow adjacent to the abdominal wall. Elbow is flexed resulting in nonstandard imaging planes. Bones/Joint/Cartilage No acute fracture. No dislocation. Elbow joint spaces are preserved. No erosion or periosteal elevation. No appreciable elbow joint effusion. Ligaments Suboptimally assessed by CT. Muscles and Tendons Evaluation of the musculotendinous structures is significantly limited. No obvious intramuscular fluid collection. Soft tissues Soft tissue edema with ill-defined fluid most pronounced along the ulnar aspect of the proximal forearm. No organized fluid collection is evident by noncontrast CT. No soft tissue gas. Catheter tubing is visualized within the upper arm. IMPRESSION: 1. Limited exam. 2. Soft tissue edema with ill-defined fluid most pronounced along the  ulnar aspect of the proximal forearm, suggesting cellulitis. No organized fluid collection is evident. 3. No appreciable elbow joint effusion to suggest septic arthritis. 4. No acute osseous abnormality. Electronically Signed   By: Davina Poke D.O.   On: 01/02/2021 17:17     LOS: 17 days   Oren Binet, MD  [facility]    To contact the attending provider between 7A-7P or the covering provider during after hours 7P-7A, please log into the web site [email or web address] and access using universal [place] password for that web site. If you do not have the password, please call the hospital operator.  01/04/2021, 11:02 AM

## 2021-01-05 LAB — RENAL FUNCTION PANEL
Albumin: 2.3 g/dL — ABNORMAL LOW (ref 3.5–5.0)
Albumin: 2.6 g/dL — ABNORMAL LOW (ref 3.5–5.0)
Anion gap: 11 (ref 5–15)
Anion gap: 9 (ref 5–15)
BUN: 16 mg/dL (ref 8–23)
BUN: 12 mg/dL (ref 8–23)
CO2: 23 mmol/L (ref 22–32)
CO2: 27 mmol/L (ref 22–32)
Calcium: 7.3 mg/dL — ABNORMAL LOW (ref 8.9–10.3)
Calcium: 7.5 mg/dL — ABNORMAL LOW (ref 8.9–10.3)
Chloride: 94 mmol/L — ABNORMAL LOW (ref 98–111)
Chloride: 95 mmol/L — ABNORMAL LOW (ref 98–111)
Creatinine, Ser: 1.05 mg/dL — ABNORMAL HIGH (ref 0.44–1.00)
Creatinine, Ser: 0.88 mg/dL (ref 0.44–1.00)
GFR, Estimated: 56 mL/min — ABNORMAL LOW (ref >60)
GFR, Estimated: >60 mL/min (ref >60)
Glucose, Bld: 132 mg/dL — ABNORMAL HIGH (ref 70–99)
Glucose, Bld: 143 mg/dL — ABNORMAL HIGH (ref 70–99)
Phosphorus: 2.8 mg/dL (ref 2.5–4.6)
Phosphorus: 2.4 mg/dL — ABNORMAL LOW (ref 2.5–4.6)
Potassium: 3.2 mmol/L — ABNORMAL LOW (ref 3.5–5.1)
Potassium: 3 mmol/L — ABNORMAL LOW (ref 3.5–5.1)
Sodium: 128 mmol/L — ABNORMAL LOW (ref 135–145)
Sodium: 131 mmol/L — ABNORMAL LOW (ref 135–145)

## 2021-01-05 LAB — CBC
HCT: 20.7 % — ABNORMAL LOW (ref 36.0–46.0)
Hemoglobin: 7.1 g/dL — ABNORMAL LOW (ref 12.0–15.0)
MCH: 29.8 pg (ref 26.0–34.0)
MCHC: 34.3 g/dL (ref 30.0–36.0)
MCV: 87 fL (ref 80.0–100.0)
Platelets: 201 10*3/uL (ref 150–400)
RBC: 2.38 MIL/uL — ABNORMAL LOW (ref 3.87–5.11)
RDW: 15.3 % (ref 11.5–15.5)
WBC: 6.1 10*3/uL (ref 4.0–10.5)
nRBC: 0 % (ref 0.0–0.2)

## 2021-01-05 LAB — GLUCOSE, CAPILLARY
Glucose-Capillary: 179 mg/dL — ABNORMAL HIGH (ref 70–99)
Glucose-Capillary: 135 mg/dL — ABNORMAL HIGH (ref 70–99)
Glucose-Capillary: 119 mg/dL — ABNORMAL HIGH (ref 70–99)
Glucose-Capillary: 162 mg/dL — ABNORMAL HIGH (ref 70–99)
Glucose-Capillary: 124 mg/dL — ABNORMAL HIGH (ref 70–99)
Glucose-Capillary: 122 mg/dL — ABNORMAL HIGH (ref 70–99)

## 2021-01-05 LAB — POCT I-STAT, CHEM 8
BUN: 18 mg/dL (ref 8–23)
Calcium, Ion: 1.07 mmol/L — ABNORMAL LOW (ref 1.15–1.40)
Chloride: 88 mmol/L — ABNORMAL LOW (ref 98–111)
Creatinine, Ser: 0.9 mg/dL (ref 0.44–1.00)
Glucose, Bld: 110 mg/dL — ABNORMAL HIGH (ref 70–99)
HCT: 24 % — ABNORMAL LOW (ref 36.0–46.0)
Hemoglobin: 8.2 g/dL — ABNORMAL LOW (ref 12.0–15.0)
Potassium: 3.4 mmol/L — ABNORMAL LOW (ref 3.5–5.1)
Sodium: 129 mmol/L — ABNORMAL LOW (ref 135–145)
TCO2: 27 mmol/L (ref 22–32)

## 2021-01-05 LAB — PREPARE RBC (CROSSMATCH)

## 2021-01-05 LAB — CORTISOL-AM, BLOOD: Cortisol - AM: 4.1 ug/dL — ABNORMAL LOW (ref 6.7–22.6)

## 2021-01-05 MED ORDER — ALBUMIN HUMAN 25 % IV SOLN
25.0000 g | Freq: Two times a day (BID) | INTRAVENOUS | Status: AC
  Administered 2021-01-05 (×2): 25 g via INTRAVENOUS
  Filled 2021-01-05 (×2): qty 100

## 2021-01-05 MED ORDER — DIPHENHYDRAMINE HCL 50 MG/ML IJ SOLN
25.0000 mg | Freq: Once | INTRAMUSCULAR | Status: AC
  Administered 2021-01-05: 25 mg via INTRAVENOUS
  Filled 2021-01-05: qty 1

## 2021-01-05 MED ORDER — ACETAMINOPHEN 500 MG PO TABS: 1000.0000 mg | ORAL_TABLET | Freq: Three times a day (TID) | ORAL | Status: DC

## 2021-01-05 MED ORDER — FUROSEMIDE 10 MG/ML IJ SOLN
20.0000 mg | Freq: Once | INTRAMUSCULAR | Status: DC
  Filled 2021-01-05: qty 2

## 2021-01-05 MED ORDER — COSYNTROPIN 0.25 MG IJ SOLR
0.2500 mg | Freq: Once | INTRAMUSCULAR | Status: AC
  Administered 2021-01-06: 06:00:00 0.25 mg via INTRAVENOUS
  Filled 2021-01-05: qty 0.25

## 2021-01-05 MED ORDER — ACETAMINOPHEN 325 MG PO TABS
650.0000 mg | ORAL_TABLET | Freq: Once | ORAL | Status: DC
  Filled 2021-01-05: qty 2

## 2021-01-05 MED ORDER — POTASSIUM CHLORIDE CRYS ER 20 MEQ PO TBCR
40.0000 meq | EXTENDED_RELEASE_TABLET | Freq: Once | ORAL | Status: AC
  Administered 2021-01-05: 40 meq via ORAL
  Filled 2021-01-05: qty 2

## 2021-01-05 MED ORDER — ACETAMINOPHEN 10 MG/ML IV SOLN
1000.0000 mg | Freq: Three times a day (TID) | INTRAVENOUS | Status: AC
  Administered 2021-01-05 – 2021-01-06 (×3): 1000 mg via INTRAVENOUS
  Filled 2021-01-05 (×3): qty 100

## 2021-01-05 MED ORDER — FUROSEMIDE 10 MG/ML IJ SOLN
40.0000 mg | Freq: Once | INTRAMUSCULAR | Status: AC
  Administered 2021-01-05: 40 mg via INTRAVENOUS
  Filled 2021-01-05: qty 4

## 2021-01-05 MED ORDER — SODIUM CHLORIDE 0.9% IV SOLUTION: Freq: Once | INTRAVENOUS | Status: AC

## 2021-01-05 NOTE — Progress Notes (Signed)
PROGRESS NOTE        PATIENT DETAILS Name: Jillian Leblanc Age: 75 y.o. Sex: female Date of Birth: 11-23-1945 Admit Date: 12/18/2020 Admitting Physician Rhetta Mura, DO [id number], [name], MD  Brief Narrative: [name] a 75 y.o. 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 coli bacteremia-along with vertebral osteomyelitis involving L4-S1.  Initially managed in the [facility]-transferred to the [facility].  Patient was evaluated by neurosurgery, ID-started on appropriate antimicrobial therapy-neurosurgery felt that patient could be watched with IV antibiotics.  However for the hospital course was complicated by worsening back pain-repeat CT L-spine on 12/12-showed worsening findings-subsequently neurosurgery performed L4-L5 laminectomy and evacuation of epidural abscess.  See below for further details.  Subjective: Still in pain but slightly more comfortable compared to yesterday.  Somewhat confused-but easily redirectable.  Per nursing staff-hallucinating at times.  Objective: Vitals: Blood pressure (!) 144/55, pulse 95, temperature 99.1 F (37.3 C), temperature source Oral, resp. rate 14, height 5' (1.524 m), weight 88 kg, SpO2 96 %.   Exam: Gen Exam: Confused but redirectable.  Answers simple questions appropriately. HEENT:atraumatic, normocephalic Chest: B/L clear to auscultation anteriorly CVS:S1S2 regular Abdomen:soft non tender, non distended Extremities:no edema Neurology: Difficult exam due to confusion-she is able to bend both knees and left both legs off the bed. Skin: no rash    Pertinent Labs/Radiology: Recent Labs  Lab 01/05/21 0453  WBC 6.1  HGB 7.1*  PLT 201  NA 128*  K 3.2*  CREATININE 1.05*      Assessment/Plan: Septic shock  due to E. coli bacteremia-L4-S1 osteomyelitis with epidural abscess: Sepsis physiology has resolved-on IV Ancef-plans were to continue IV Ancef for 6 weeks-however developed worsening back pain-repeat CT imaging showed concerning findings for worsening of underlying disease-patient subsequently was reevaluated by neurosurgery-and underwent L4-L5 laminectomy and evacuation of epidural abscess.  Per nursing staff-some blood oozing from the incision site-dressing is to be changed twice this morning-I have notified neurosurgery-remains off SQ heparin.  Intraoperative cultures pending-I will touch base with ID to see if we need to adjust antimicrobial therapy regimen.  Acute metabolic encephalopathy: Due to septic shock/AKI/hospital delirium-mental status had improved-however has developed worsening delirium due to pain-and increased narcotic requirement.  We will try IV Tylenol and minimize narcotics as much as possible.    Acute hypoxic respiratory failure: Multifactorial etiology-due to asthma exacerbation and pulmonary edema.  Continues to clinically improve-with supportive care and diuretic regimen-on minimal amount of oxygen.  No longer on steroids.  Hyponatremia: Due to volume overload-sodium  Hypokalemia: Repleted.  AKI: Mild-felt to be due to intravascular volume depletion-responding well to IV albumin/Lasix combination.  PAF with RVR: Maintaining sinus rhythm-initially required amiodarone-reviewed cardiology note-anticoagulation issue to be addressed upon outpatient follow-up.  Thrombocytopenia: Resolved-likely due to sepsis.  Anemia: Multifactorial etiology-but mostly due to acute illness-no evidence of blood loss-follow closely and transfuse if Hb <7.  HFpEF: volume overload is due to hypoalbuminemia/third spacing rather than decompensated heart failure.  On IV Lasix in any event.  DM-2 (A1c 8.27 October 2020): CBGs stable-no longer on Levemir-continue SSI and follow trend.    Recent Labs     01/05/21 0107 01/05/21 0517 01/05/21 0735  GLUCAP 179* 135* 119*  Hypothyroidism: Continue Synthroid-both TSH/free T4 elevated on recent labs-likely due to sick euthyroid syndrome-recommendations are to repeat thyroid function test in a few weeks.  GERD: Continue PPI  Dysphagia: Tolerating dysphagia 3 diet-I suspect this is from deconditioning.  SLP following.  Left elbow pain: Seems to have improved-CT elbow on 12/10-does not show any collection-shows cellulitis.  Already on IV Ancef.  Dopplers negative for DVT as well.  For now supportive care.   Morbid Obesity Estimated body mass index is 37.89 kg/m as calculated from the following:   Height as of this encounter: 5' (1.524 m).   Weight as of this encounter: 88 kg.    Procedures: 12/12>>L4, L5 laminectomies, evacuation of epidural abscess, L3 to S2 / pelvis posterolateral instrumented fusion Consults:PCCM, ID, neurosurgery DVT Prophylaxis: Heparin Code Status:Full code  Family Communication: Daughter-Wendy-[phone] updated over the phone on 12/12.  Time spent: 35 minutes-Greater than 50% of this time was spent in counseling, explanation of diagnosis, planning of further management, and coordination of care.   Disposition Plan: Status is: Inpatient  Remains inpatient appropriate because: Bacteremia-epidural abscess-lumbar osteomyelitis-febrile-hyponatremia-not yet stable for discharge.    Diet: Diet Order             Diet heart healthy/carb modified Room service appropriate? No; Fluid consistency: Thin; Fluid restriction: 1500 mL Fluid  Diet effective now                     Antimicrobial agents: Anti-infectives (From admission, onward)    Start     Dose/Rate Route Frequency Ordered Stop   12/21/20 1800  ceFAZolin (ANCEF) IVPB 2g/100 mL premix        2 g 200 mL/hr over 30 Minutes Intravenous Every 8 hours 12/21/20 1216     12/20/20 1800  cefTRIAXone (ROCEPHIN) 2 g in sodium chloride 0.9 % 100 mL  IVPB  Status:  Discontinued        2 g 200 mL/hr over 30 Minutes Intravenous Every 24 hours 12/20/20 0920 12/21/20 1216   12/20/20 0100  meropenem (MERREM) 1 g in sodium chloride 0.9 % 100 mL IVPB  Status:  Discontinued        1 g 200 mL/hr over 30 Minutes Intravenous Every 8 hours 12/20/20 0004 12/20/20 0920   12/19/20 2000  ceFEPIme (MAXIPIME) 2 g in sodium chloride 0.9 % 100 mL IVPB  Status:  Discontinued        2 g 200 mL/hr over 30 Minutes Intravenous Every 24 hours 12/19/20 0057 12/19/20 1023   12/19/20 2000  cefTRIAXone (ROCEPHIN) 2 g in sodium chloride 0.9 % 100 mL IVPB  Status:  Discontinued        2 g 200 mL/hr over 30 Minutes Intravenous Every 24 hours 12/19/20 1023 12/20/20 0004   12/19/20 0057  vancomycin variable dose per unstable renal function (pharmacist dosing)  Status:  Discontinued         Does not apply See admin instructions 12/19/20 0057 12/19/20 1023   12/18/20 2000  ceFEPIme (MAXIPIME) 2 g in sodium chloride 0.9 % 100 mL IVPB        2 g 200 mL/hr over 30 Minutes Intravenous  Once 12/18/20 1955 12/18/20 2050   12/18/20 2000  metroNIDAZOLE (FLAGYL) IVPB 500 mg        500 mg 100 mL/hr over 60 Minutes Intravenous  Once 12/18/20 1955 12/18/20 2210   12/18/20 2000  vancomycin (VANCOREADY) IVPB 1750 mg/350 mL        1,750 mg  175 mL/hr over 120 Minutes Intravenous  Once 12/18/20 1955 12/19/20 0045        MEDICATIONS: Scheduled Meds:  sodium chloride   Intravenous Once   arformoterol  15 mcg Nebulization BID   calcium carbonate  800 mg of elemental calcium Oral BID   calcium-vitamin D  0.5 tablet Oral BID   Chlorhexidine Gluconate Cloth  6 each Topical Daily   fluticasone  1 spray Each Nare Daily   gabapentin  300 mg Oral BID   heparin injection (subcutaneous)  5,000 Units Subcutaneous Q8H   HYDROmorphone       HYDROmorphone       insulin aspart  0-15 Units Subcutaneous Q4H   levothyroxine  150 mcg Oral Q0600   lidocaine  1 patch Transdermal Daily    loratadine  10 mg Oral Daily   mouth rinse  15 mL Mouth Rinse BID   midazolam       mupirocin ointment  1 application Nasal BID   nystatin   Topical BID   pantoprazole  40 mg Oral Daily   polyethylene glycol  17 g Oral Daily   pramipexole  1.5 mg Oral TID   revefenacin  175 mcg Nebulization Daily   sodium chloride flush  10-40 mL Intracatheter Q12H   sodium chloride  1 g Oral TID WC   Continuous Infusions:  sodium chloride Stopped (12/21/20 1240)   sodium chloride     acetaminophen      ceFAZolin (ANCEF) IV 2 g (01/05/21 0822)   PRN Meds:.sodium chloride, benzonatate, dextromethorphan, docusate sodium, guaiFENesin, HYDROmorphone (DILAUDID) injection, hydrOXYzine, ipratropium-albuterol, lip balm, methocarbamol, naLOXone (NARCAN)  injection, oxyCODONE, phenol, sodium chloride flush   I have personally reviewed following labs and imaging studies  LABORATORY DATA: CBC: Recent Labs  Lab 12/30/20 0609 12/31/20 0222 01/03/21 0706 01/04/21 0508 01/05/21 0453  WBC 7.7 6.6 9.8 6.1 6.1  HGB 7.6* 7.8* 7.8* 7.3* 7.1*  HCT 23.4* 24.4* 23.6* 22.3* 20.7*  MCV 88.3 91.7 87.4 88.5 87.0  PLT 251 222 294 242 201     Basic Metabolic Panel: Recent Labs  Lab 01/03/21 0706 01/03/21 1621 01/04/21 0508 01/04/21 1644 01/05/21 0453  NA 119* 122* 123* 127* 128*  K 4.2 3.7 3.6 3.1* 3.2*  CL 86* 89* 87* 91* 94*  CO2 19* 23 26 27 23   GLUCOSE 165* 126* 113* 91 132*  BUN 19 22 19 19 16   CREATININE 1.30* 1.35* 1.10* 1.10* 1.05*  CALCIUM 8.0* 8.0* 8.1* 8.0* 7.3*  PHOS  --  4.5 3.7 4.1 2.8     GFR: Estimated Creatinine Clearance: 47.1 mL/min (A) (by C-G formula based on SCr of 1.05 mg/dL (H)).  Liver Function Tests: Recent Labs  Lab 01/03/21 1621 01/04/21 0508 01/04/21 1644 01/05/21 0453  ALBUMIN 2.7* 2.5* 2.8* 2.3*    No results for input(s): LIPASE, AMYLASE in the last 168 hours. Recent Labs  Lab 01/03/21 0706  AMMONIA 21     Coagulation Profile: No results for  input(s): INR, PROTIME in the last 168 hours.  Cardiac Enzymes: No results for input(s): CKTOTAL, CKMB, CKMBINDEX, TROPONINI in the last 168 hours.  BNP (last 3 results) No results for input(s): PROBNP in the last 8760 hours.  Lipid Profile: No results for input(s): CHOL, HDL, LDLCALC, TRIG, CHOLHDL, LDLDIRECT in the last 72 hours.  Thyroid Function Tests: No results for input(s): TSH, T4TOTAL, FREET4, T3FREE, THYROIDAB in the last 72 hours.  Anemia Panel: No results for input(s): VITAMINB12, FOLATE, FERRITIN, TIBC, IRON,  RETICCTPCT in the last 72 hours.  Urine analysis:    Component Value Date/Time   COLORURINE YELLOW 12/30/2020 1022   APPEARANCEUR CLEAR 12/30/2020 1022   LABSPEC <1.005 (L) 12/30/2020 1022   PHURINE 7.0 12/30/2020 1022   GLUCOSEU NEGATIVE 12/30/2020 1022   HGBUR MODERATE (A) 12/30/2020 1022   BILIRUBINUR NEGATIVE 12/30/2020 1022   KETONESUR NEGATIVE 12/30/2020 1022   PROTEINUR NEGATIVE 12/30/2020 1022   UROBILINOGEN 0.2 09/15/2012 1536   NITRITE NEGATIVE 12/30/2020 1022   LEUKOCYTESUR MODERATE (A) 12/30/2020 1022    Sepsis Labs: Lactic Acid, Venous    Component Value Date/Time   LATICACIDVEN 1.5 12/20/2020 0429    MICROBIOLOGY: Recent Results (from the past 240 hour(s))  Resp Panel by RT-PCR (Flu A&B, Covid) Nasopharyngeal Swab     Status: None   Collection Time: 12/29/20  3:29 PM   Specimen: Nasopharyngeal Swab; Nasopharyngeal(NP) swabs in vial transport medium  Result Value Ref Range Status   SARS Coronavirus 2 by RT PCR NEGATIVE NEGATIVE Final    Comment: (NOTE) SARS-CoV-2 target nucleic acids are NOT DETECTED.  The SARS-CoV-2 RNA is generally detectable in upper respiratory specimens during the acute phase of infection. The lowest concentration of SARS-CoV-2 viral copies this assay can detect is 138 copies/mL. A negative result does not preclude SARS-Cov-2 infection and should not be used as the sole basis for treatment or other patient  management decisions. A negative result may occur with  improper specimen collection/handling, submission of specimen other than nasopharyngeal swab, presence of viral mutation(s) within the areas targeted by this assay, and inadequate number of viral copies(<138 copies/mL). A negative result must be combined with clinical observations, patient history, and epidemiological information. The expected result is Negative.  Fact Sheet for Patients:  EntrepreneurPulse.com.au  Fact Sheet for Healthcare Providers:  IncredibleEmployment.be  This test is no t yet approved or cleared by the [place] FDA and  has been authorized for detection and/or diagnosis of SARS-CoV-2 by FDA under an Emergency Use Authorization (EUA). This EUA will remain  in effect (meaning this test can be used) for the duration of the COVID-19 declaration under Section 564(b)(1) of the Act, 21 U.S.C.section 360bbb-3(b)(1), unless the authorization is terminated  or revoked sooner.       Influenza A by PCR NEGATIVE NEGATIVE Final   Influenza B by PCR NEGATIVE NEGATIVE Final    Comment: (NOTE) The Xpert Xpress SARS-CoV-2/FLU/RSV plus assay is intended as an aid in the diagnosis of influenza from Nasopharyngeal swab specimens and should not be used as a sole basis for treatment. Nasal washings and aspirates are unacceptable for Xpert Xpress SARS-CoV-2/FLU/RSV testing.  Fact Sheet for Patients: EntrepreneurPulse.com.au  Fact Sheet for Healthcare Providers: IncredibleEmployment.be  This test is not yet approved or cleared by the [place] FDA and has been authorized for detection and/or diagnosis of SARS-CoV-2 by FDA under an Emergency Use Authorization (EUA). This EUA will remain in effect (meaning this test can be used) for the duration of the COVID-19 declaration under Section 564(b)(1) of the Act, 21 U.S.C. section 360bbb-3(b)(1),  unless the authorization is terminated or revoked.  Performed at [facility], [place] [address] [place], [place] [address]   Culture, blood (routine x 2)     Status: None   Collection Time: 12/30/20 11:33 AM   Specimen: BLOOD RIGHT HAND  Result Value Ref Range Status   Specimen Description BLOOD RIGHT HAND  Final   Special Requests   Final    BOTTLES DRAWN  AEROBIC AND ANAEROBIC Blood Culture adequate volume   Culture   Final    NO GROWTH 5 DAYS Performed at [facility], [place] [address], [place], [place] [address]    Report Status 01/04/2021 FINAL  Final  Culture, blood (routine x 2)     Status: None   Collection Time: 12/30/20 11:43 AM   Specimen: BLOOD RIGHT HAND  Result Value Ref Range Status   Specimen Description BLOOD RIGHT HAND  Final   Special Requests   Final    BOTTLES DRAWN AEROBIC AND ANAEROBIC Blood Culture adequate volume   Culture   Final    NO GROWTH 5 DAYS Performed at [facility], [place] [address] [place], [place] [address]    Report Status 01/04/2021 FINAL  Final  Surgical pcr screen     Status: Abnormal   Collection Time: 01/04/21  1:07 PM   Specimen: Nasal Mucosa; Nasal Swab  Result Value Ref Range Status   MRSA, PCR POSITIVE (A) NEGATIVE Final    Comment: RESULT CALLED TO, READ BACK BY AND VERIFIED WITH: RM C GANOE [id number] AT 1443 BY CM    Staphylococcus aureus POSITIVE (A) NEGATIVE Final    Comment: (NOTE) The Xpert SA Assay (FDA approved for NASAL specimens in patients 73 years of age and older), is one component of a comprehensive surveillance program. It is not intended to diagnose infection nor to guide or monitor treatment. Performed at [facility], [place] [address] [place], [place] [address]     RADIOLOGY STUDIES/RESULTS: DG Lumbar Spine 2-3 Views  Result Date: 01/04/2021 CLINICAL DATA:  Intraoperative evaluation, laminectomy infusion EXAM: LUMBAR SPINE - 2-3 VIEW COMPARISON:  01/04/2021 FINDINGS: Four fluoroscopic  images are obtained during the performance of the procedure and are provided for interpretation only. Images demonstrate intrapedicular screws within for contiguous levels of the lumbar spine. Evaluation is limited due to technique and body habitus. Please refer to operative report. FLUOROSCOPY TIME:  2 minutes and 7 seconds IMPRESSION: 1. Limited intraoperative evaluation during multilevel lumbar levin ectomy infusion. Electronically Signed   By: Randa Ngo M.D.   On: 01/04/2021 23:30   CT LUMBAR SPINE WO CONTRAST  Result Date: 01/04/2021 CLINICAL DATA:  Low back pain, cauda equina syndrome suspected EXAM: CT LUMBAR SPINE WITHOUT CONTRAST TECHNIQUE: Multidetector CT imaging of the lumbar spine was performed without intravenous contrast administration. Multiplanar CT image reconstructions were also generated. COMPARISON:  CT 12/19/2020 FINDINGS: Segmentation: 5 lumbar type vertebrae. Alignment: Focal kyphosis at T12 related to a T12 anterior compression deformity. No significant listhesis. Vertebrae: Progressive sclerosis and bony destruction of the L5 vertebral body. Height loss remains at about 40%. There is approximately 4 mm of bony retropulsion, similar to prior exam. Paraspinal and other soft tissues: There is bilateral hydronephrosis likely related to a markedly distended urinary bladder which is partially visualized. Aortoiliac atherosclerotic calcifications. No lymphadenopathy. There is a right-sided sacral nerve stimulator in place. Disc levels: Unchanged chronic disc and degenerative endplate changes at [id number], T12-L1, and L1-L2. L2-L3: No significant spinal canal or neural foraminal narrowing. L3-L4: Nonspecific mineralization along the anterior spinal canal in a linear and longitudinal orientation. This is new since recent CT on November 26th. L4-L5: Unchanged broad-based disc bulging. Decreased amount of paraspinal gas but increased soft tissue density along the anterior and lateral  paraspinal soft tissues (axial series 3, image 96). Moderate to severe right and mild-to-moderate left-sided neural foraminal narrowing. There is mild to moderate spinal canal narrowing.  L5-S1: Decreased amount of intradiscal and paraspinal gas. Unchanged degree of moderate spinal canal stenosis and bilateral neural foraminal narrowing. IMPRESSION: Progressive sclerosis and bony destruction of the L5 vertebral body, though with unchanged overall 40% height loss. Decreased paraspinal and intradiscal gas. Increased anterior and lateral paraspinal soft tissue density at L4-L5. Findings are concerning for discitis osteomyelitis with paraspinal phlegmon. Unchanged degree of spinal canal stenosis and bilateral neural foraminal narrowing at L4-L5 and L5-S1. Assessment for epidural collections is limited by noncontrast CT. If able, MRI would be ideal for further evaluation. Otherwise CT with contrast may be useful. Mineralization along the anterior spinal canal at L3-L4, in a linear and longitudinal orientation, new since recent CT on 11/26. 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 Electronically Signed   By: Maurine Simmering M.D.   On: 01/04/2021 11:11   DG C-Arm 1-60 Min-No Report  Result Date: 01/04/2021 Fluoroscopy was utilized by the requesting physician.  No radiographic interpretation.   DG C-Arm 1-60 Min-No Report  Result Date: 01/04/2021 Fluoroscopy was utilized by the requesting physician.  No radiographic interpretation.     LOS: 18 days   Oren Binet, MD  [facility]    To contact the attending provider between 7A-7P or the covering provider during after hours 7P-7A, please log into the web site [email or web address] and access using universal [facility] password for that web site. If you do not have the password, please call the hospital operator.  01/05/2021, 10:38  AM

## 2021-01-05 NOTE — Progress Notes (Signed)
Neurosurgery Service Progress Note  Subjective: No acute events overnight, back pain significant but she states it's better than preop, no radicular pain   Objective: Vitals:   01/05/21 1650 01/05/21 1851 01/05/21 1912 01/05/21 1958  BP: (!) 107/53 125/63 116/74 (!) 98/50  Pulse: 86 (!) 113 80 82  Resp: 14 17 15 19   Temp:  98.7 F (37.1 C) 98.7 F (37.1 C) 98.7 F (37.1 C)  TempSrc:  Oral Oral Oral  SpO2: 93% 94% 100% 100%  Weight:      Height:        Physical Exam: Strength 5/5 x4 except 4+/5 dorsiflexion weakness - difficult to assess 2/2 poor cooperation with exam, SILTx4, incision c/d/I with some bloody staining on pillow  Assessment & Plan: 75 y.o. woman w/ bacteremia, large epidural abscess, osteomyelitis with pathologic fracture, s/p decompression / epidural abscess evacuation and PSIF.  -activity as tolerated -can place ABD pad or chux underneath dressing if she has any further bleeding from skin edges of the incision, but should be fairly minimal volume-wise -okay for DVT chemoprophylaxis 01/06/21  01/08/21  01/05/21 9:18 PM

## 2021-01-05 NOTE — Anesthesia Postprocedure Evaluation (Signed)
Anesthesia Post Note  Patient: Shantal Roan  Procedure(s) Performed: Lumbar four, lumbar five laminectomies; Lumbar three - pelvis posterolateral instrumented fusion (Spine Lumbar)     Patient location during evaluation: PACU Anesthesia Type: General Level of consciousness: awake and alert Pain management: pain level controlled Vital Signs Assessment: post-procedure vital signs reviewed and stable Respiratory status: spontaneous breathing, nonlabored ventilation, respiratory function stable and patient connected to nasal cannula oxygen Cardiovascular status: blood pressure returned to baseline and stable Postop Assessment: no apparent nausea or vomiting Anesthetic complications: no   No notable events documented.  Last Vitals:  Vitals:   01/04/21 2330 01/04/21 2335  BP:    Pulse: 88 91  Resp: 16 18  Temp:    SpO2: 96% 98%    Last Pain:  Vitals:   01/04/21 2300  TempSrc:   PainSc: 7                  Shelton Silvas

## 2021-01-05 NOTE — Progress Notes (Signed)
[facility] NEPHROLOGY PROGRESS NOTE  Assessment/ Plan: Pt is a 75 y.o. yo female  with a PMH of DM2, hypothryoidism, HFpEF, sinus pauses s/p PPM here with septick shock 2/2 E. Coli bacteremia along with vertebral osteomyelitis L4-S1 Nephrology was c/s due to patients hypokalemia and AKI.    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  The acute worsening in her sodium is perhaps due to hypervolemia in the setting of fluid resuscitation 2/2 sepsis. Patient was noted to have peripheral edema and worsened sodium level with IV fluid 12/11.  TSH elevated but improved from prior. AM cortisol decreased to 4.1 microg/dL this is not diagnostic for adrenal insufficiency. Will consider ACTH stim test.   Patient's sodium continues to improve with lasix and fluid restriction. Urine studies c/w Indicative of SIADH and decreased EABV.  -Continue fluid restriction, agree with continuing IV albumin, consider redosing IV lasix in the AM -Continue salt tablets   #Acute kidney injury, nonoliguric: Patient  with good urine output ON, ~4L.  sCr Marginally increased on AM labs. AKI likely hemodynamically mediated with decreased intravascular volume/third spacing concomitant with prolonged hospitalization with complicated course including septic shock/UTI.  CT scan ruled out hydronephrosis. Will continue to monitor with above.   #Encephalopathy: With long hospitalization and significant back pain, likely ongoing delirium.  Do not think hyponatremia is contributing.  Patient was alert but confused on exam this AM.    #Sepsis shock due to E. coli bacteremia, L4 S1 osteomyelitis with suspected epidural abscess: Currently on IV Ancef per ID.  She is now s/p L4-5 laminectomy w/ evacuation of epidural abscess and L3-S2 posterolateral fusion. Patient was taken to the OR given AUR and  decreased lower extremity weakness. Pain management per primary.    #Metabolic acidosis: Resolved after starting oral sodium bicarbonate. Oral bicarb discontinued 12/12    #Hypokalemia: Potassium level improved. Likely decreased in the setting of IV lasix. Continue to replete    Subjective:   Patient taken to the OR given worsened back pain, AUR, and reported lower extremity weakness. She is confused this AM and notes persistent back pain.   Objective Vital signs in last 24 hours: Vitals:   01/05/21 0859 01/05/21 1210 01/05/21 1300 01/05/21 1546  BP:  (!) 112/32 98/72 (!) 133/57  Pulse:  100  92  Resp:  19  20  Temp:  100.1 F (37.8 C)  99.2 F (37.3 C)  TempSrc:  Oral  Oral  SpO2: 96% 96%  94%  Weight:      Height:       Weight change:   Intake/Output Summary (Last 24 hours) at 01/05/2021 1646 Last data filed at 01/05/2021 0900 Gross per 24 hour  Intake 2920 ml  Output 2425 ml  Net 495 ml      Labs: Basic Metabolic Panel: Recent Labs  Lab 01/04/21 0508 01/04/21 1644 01/04/21 2044 01/05/21 0453  NA 123* 127* 129* 128*  K 3.6 3.1* 3.4* 3.2*  CL 87* 91* 88* 94*  CO2 26 27  --  23  GLUCOSE 113* 91 110* 132*  BUN 19 19 18 16   CREATININE 1.10* 1.10* 0.90 1.05*  CALCIUM 8.1* 8.0*  --  7.3*  PHOS 3.7 4.1  --  2.8    Liver Function Tests: Recent Labs  Lab 01/04/21 0508 01/04/21 1644 01/05/21 0453  ALBUMIN 2.5* 2.8* 2.3*    No results  for input(s): LIPASE, AMYLASE in the last 168 hours. Recent Labs  Lab 01/03/21 0706  AMMONIA 21    CBC: Recent Labs  Lab 12/30/20 0609 12/31/20 0222 01/03/21 0706 01/04/21 0508 01/04/21 2044 01/05/21 0453  WBC 7.7 6.6 9.8 6.1  --  6.1  HGB 7.6* 7.8* 7.8* 7.3* 8.2* 7.1*  HCT 23.4* 24.4* 23.6* 22.3* 24.0* 20.7*  MCV 88.3 91.7 87.4 88.5  --  87.0  PLT 251 222 294 242  --  201    Cardiac Enzymes: No results for input(s): CKTOTAL, CKMB, CKMBINDEX, TROPONINI in the last 168 hours. CBG: Recent Labs  Lab  01/05/21 0107 01/05/21 0517 01/05/21 0735 01/05/21 1212 01/05/21 1550  GLUCAP 179* 135* 119* 162* 124*     Iron Studies: No results for input(s): IRON, TIBC, TRANSFERRIN, FERRITIN in the last 72 hours. Studies/Results: DG Lumbar Spine 2-3 Views  Result Date: 01/04/2021 CLINICAL DATA:  Intraoperative evaluation, laminectomy infusion EXAM: LUMBAR SPINE - 2-3 VIEW COMPARISON:  01/04/2021 FINDINGS: Four fluoroscopic images are obtained during the performance of the procedure and are provided for interpretation only. Images demonstrate intrapedicular screws within for contiguous levels of the lumbar spine. Evaluation is limited due to technique and body habitus. Please refer to operative report. FLUOROSCOPY TIME:  2 minutes and 7 seconds IMPRESSION: 1. Limited intraoperative evaluation during multilevel lumbar levin ectomy infusion. Electronically Signed   By: Randa Ngo M.D.   On: 01/04/2021 23:30   CT LUMBAR SPINE WO CONTRAST  Result Date: 01/04/2021 CLINICAL DATA:  Low back pain, cauda equina syndrome suspected EXAM: CT LUMBAR SPINE WITHOUT CONTRAST TECHNIQUE: Multidetector CT imaging of the lumbar spine was performed without intravenous contrast administration. Multiplanar CT image reconstructions were also generated. COMPARISON:  CT 12/19/2020 FINDINGS: Segmentation: 5 lumbar type vertebrae. Alignment: Focal kyphosis at T12 related to a T12 anterior compression deformity. No significant listhesis. Vertebrae: Progressive sclerosis and bony destruction of the L5 vertebral body. Height loss remains at about 40%. There is approximately 4 mm of bony retropulsion, similar to prior exam. Paraspinal and other soft tissues: There is bilateral hydronephrosis likely related to a markedly distended urinary bladder which is partially visualized. Aortoiliac atherosclerotic calcifications. No lymphadenopathy. There is a right-sided sacral nerve stimulator in place. Disc levels: Unchanged chronic disc and  degenerative endplate changes at [id number], T12-L1, and L1-L2. L2-L3: No significant spinal canal or neural foraminal narrowing. L3-L4: Nonspecific mineralization along the anterior spinal canal in a linear and longitudinal orientation. This is new since recent CT on November 26th. L4-L5: Unchanged broad-based disc bulging. Decreased amount of paraspinal gas but increased soft tissue density along the anterior and lateral paraspinal soft tissues (axial series 3, image 96). Moderate to severe right and mild-to-moderate left-sided neural foraminal narrowing. There is mild to moderate spinal canal narrowing. L5-S1: Decreased amount of intradiscal and paraspinal gas. Unchanged degree of moderate spinal canal stenosis and bilateral neural foraminal narrowing. IMPRESSION: Progressive sclerosis and bony destruction of the L5 vertebral body, though with unchanged overall 40% height loss. Decreased paraspinal and intradiscal gas. Increased anterior and lateral paraspinal soft tissue density at L4-L5. Findings are concerning for discitis osteomyelitis with paraspinal phlegmon. Unchanged degree of spinal canal stenosis and bilateral neural foraminal narrowing at L4-L5 and L5-S1. Assessment for epidural collections is limited by noncontrast CT. If able, MRI would be ideal for further evaluation. Otherwise CT with contrast may be useful. Mineralization along the anterior spinal canal at L3-L4, in a linear and longitudinal orientation, new since recent CT on 11/26. 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 Electronically Signed   By: Maurine Simmering M.D.   On: 01/04/2021 11:11   DG C-Arm 1-60 Min-No Report  Result Date: 01/04/2021 Fluoroscopy was utilized by the requesting physician.  No radiographic interpretation.   DG C-Arm 1-60 Min-No Report  Result Date: 01/04/2021 Fluoroscopy was utilized by the  requesting physician.  No radiographic interpretation.    Medications: Infusions:  sodium chloride Stopped (12/21/20 1240)   sodium chloride     acetaminophen 1,000 mg (01/05/21 1124)   albumin human 25 g (01/05/21 1227)    ceFAZolin (ANCEF) IV 2 g (01/05/21 [id number])    Scheduled Medications:  sodium chloride   Intravenous Once   arformoterol  15 mcg Nebulization BID   calcium carbonate  800 mg of elemental calcium Oral BID   calcium-vitamin D  0.5 tablet Oral BID   Chlorhexidine Gluconate Cloth  6 each Topical Daily   fluticasone  1 spray Each Nare Daily   gabapentin  300 mg Oral BID   insulin aspart  0-15 Units Subcutaneous Q4H   levothyroxine  150 mcg Oral Q0600   lidocaine  1 patch Transdermal Daily   loratadine  10 mg Oral Daily   mouth rinse  15 mL Mouth Rinse BID   mupirocin ointment  1 application Nasal BID   nystatin   Topical BID   pantoprazole  40 mg Oral Daily   polyethylene glycol  17 g Oral Daily   pramipexole  1.5 mg Oral TID   revefenacin  175 mcg Nebulization Daily   sodium chloride flush  10-40 mL Intracatheter Q12H   sodium chloride  1 g Oral TID WC    have reviewed scheduled and prn medications.  Physical Exam: General:Uncomfortable sitting up in bed, confused  Heart:RRR, s1s2 nl Lungs:clear to auscultation bilaterally b/l, no crackles Abdomen:soft, obese, Non-tender, non-distended, lower abdominal folds with erythema, no pus  Extremities:1+ pitting edema of BLE up to the knee  Dialysis Access: None  Rick Duff, MD Internal Medicine Resident PGY-2 01/05/2021,4:46 PM  LOS: 18 days

## 2021-01-05 NOTE — TOC Progression Note (Signed)
Transition of Care [facility]) - Progression Note    Patient Details  Name: Katieann Hungate MRN: [id number] Date of Birth: 1945-07-20  [facility] CM/SW Contact  Mearl Latin, LCSW Phone Number: 01/05/2021, 8:26 AM  Clinical Narrative:    CSW continuing to follow for medical readiness and discharge needs.      Barriers to Discharge: Continued Medical Work up  Expected Discharge Plan and Services   In-house Referral: Clinical Social Work   Post Acute Care Choice: Skilled Nursing Facility Living arrangements for the past 2 months: Single Family Home                                       Social Determinants of Health (SDOH) Interventions    Readmission Risk Interventions No flowsheet data found.

## 2021-01-05 NOTE — Progress Notes (Signed)
1 unit of PRBC transfused without complication.  Patient resting comfortably.  Confirmed with MD, [name] only 1 unit to be transfused.

## 2021-01-05 NOTE — Progress Notes (Signed)
°   01/05/21 1821  Provider Notification  Provider Name/Title Jeoffrey Massed  Date Provider Notified 01/05/21  Time Provider Notified 1822  Notification Type Page  Notification Reason Critical result  Test performed and critical result hgb 6.5  Date Critical Result Received 01/05/21  Time Critical Result Received 1819  Provider response See new orders  Date of Provider Response 01/05/21  Time of Provider Response [phone]

## 2021-01-05 NOTE — Progress Notes (Signed)
Physical Therapy Treatment Patient Details Name: Jillian Leblanc MRN: [id number] DOB: [birth year] Today's Date: 01/05/2021   History of Present Illness 75 y/o female admitted 11/25 with severe sepsis with UTI and possible L5-S1 abscess/osteo. Pt with Afib with RVR, hypotension and AMS with transfer to ICU 11/27. 01/04/21 L4, L5 laminectomies, evacuation of epidural abscess, L3 to S2 / pelvis posterolateral instrumented fusion PMHx:  DM, PPM, neuropathy, falls, L5 fx Oct '22    PT Comments    Patient now s/p lumbar fusion and evacuation of epidural abscess. Patient premedicated for pain, however fairly confused and restless during session. Able to sit at EOB with +2 total assist for 7 minutes. Plan for SNF remains appropriate and goals updated.     Recommendations for follow up therapy are one component of a multi-disciplinary discharge planning process, led by the attending physician.  Recommendations may be updated based on patient status, additional functional criteria and insurance authorization.  Follow Up Recommendations  Skilled nursing-short term rehab (<3 hours/day)     Assistance Recommended at Discharge Frequent or constant Supervision/Assistance  Equipment Recommendations  Wheelchair (measurements PT);Wheelchair cushion (measurements PT);Hospital bed;Other (comment) (hoyer lift)    Recommendations for Other Services       Precautions / Restrictions Precautions Precautions: Fall;Back Precaution Booklet Issued: No Required Braces or Orthoses: Spinal Brace Spinal Brace: Thoracolumbosacral orthotic Restrictions RLE Weight Bearing: Weight bearing as tolerated LLE Weight Bearing: Weight bearing as tolerated     Mobility  Bed Mobility Overal bed mobility: Needs Assistance Bed Mobility: Rolling;Sidelying to Sit;Sit to Sidelying Rolling: Max assist (rt and left) Sidelying to sit: +2 for physical assistance;Total assist;HOB elevated (HOB elevated after legs over EOB to assist  with raising torso)     Sit to sidelying: Mod assist;+2 for physical assistance General bed mobility comments: Assist for all aspects. Pt with incr pain with transitional movements    Transfers                   General transfer comment: pt too confused and unable to locate TLSO    Ambulation/Gait                   Stairs             Wheelchair Mobility    Modified Rankin (Stroke Patients Only)       Balance Overall balance assessment: Needs assistance Sitting-balance support: Bilateral upper extremity supported;Feet supported Sitting balance-Leahy Scale: Poor Sitting balance - Comments: UE support and min guard for static sitting Postural control: Posterior lean                                  Cognition Arousal/Alertness: Awake/alert Behavior During Therapy: Anxious Overall Cognitive Status: No family/caregiver present to determine baseline cognitive functioning Area of Impairment: Attention;Problem solving;Awareness;Following commands;Memory;Orientation                 Orientation Level: Disoriented to;Place (knew hospital, not Jillian Leblanc) Current Attention Level: Sustained Memory: Decreased short-term memory;Decreased recall of precautions Following Commands: Follows one step commands with increased time Safety/Judgement: Decreased awareness of safety;Decreased awareness of deficits Awareness: Emergent Problem Solving: Requires verbal cues;Requires tactile cues;Slow processing;Decreased initiation;Difficulty sequencing General Comments: Pt anxious and distracted by pain.        Exercises      General Comments        Pertinent Vitals/Pain Pain Assessment: Faces Faces Pain Scale: Hurts even more  Pain Location: back Pain Descriptors / Indicators: Moaning    Home Living                          Prior Function            PT Goals (current goals can now be found in the care plan section) Acute  Rehab PT Goals Patient Stated Goal: To feel better PT Goal Formulation: Patient unable to participate in goal setting Time For Goal Achievement: 01/19/21 Potential to Achieve Goals: Fair Progress towards PT goals: Not progressing toward goals - comment (new back surgery 12/12 and confused)    Frequency    Min 2X/week      PT Plan Current plan remains appropriate    Co-evaluation              AM-PAC PT "6 Clicks" Mobility   Outcome Measure  Help needed turning from your back to your side while in a flat bed without using bedrails?: Total Help needed moving from lying on your back to sitting on the side of a flat bed without using bedrails?: Total Help needed moving to and from a bed to a chair (including a wheelchair)?: Total Help needed standing up from a chair using your arms (e.g., wheelchair or bedside chair)?: Total Help needed to walk in hospital room?: Total Help needed climbing 3-5 steps with a railing? : Total 6 Click Score: 6    End of Session   Activity Tolerance: Patient limited by pain;Patient limited by fatigue;Treatment limited secondary to medical complications (Comment) (confusion, restlessness) Patient left: in bed;with call bell/phone within reach;with bed alarm set   PT Visit Diagnosis: Other abnormalities of gait and mobility (R26.89);Muscle weakness (generalized) (M62.81) Pain - Right/Left: Left Pain - part of body: Hip     Time: 9311-2162 PT Time Calculation (min) (ACUTE ONLY): 22 min  Charges:  $Therapeutic Activity: 8-22 mins                      Jerolyn Center, PT Acute Rehabilitation Services  Pager [phone] Office [phone]    Zena Amos 01/05/2021, 2:39 PM

## 2021-01-05 NOTE — Progress Notes (Signed)
[facility]  Date of Admission:  12/18/2020     Total days of antibiotics 18         ASSESSMENT:  Jillian Leblanc is POD #1 from laminectomy, epidural abscess evacuation and fusion following worsening of pain and concern for cauda equina syndrome. She is having hallucinations which nursing reports have been ongoing prior to surgery. From antibiotic standpoint surgical intervention improves likelihood of source control. No changes in antibiotics are recommended and would restart the 6 weeks from the date of surgery with continuation of Cefazolin for suspected E. Coli infection. This will place new end date for treatment at 02/15/21. Post-surgical care per Neurosurgery with remaining medical and supportive care per primary team.   PLAN:  Continue Cefazolin. Treat for 6 weeks with new end date of 02/15/21.  Post-surgical care per Neurosurgery. Remaining medical and supportive care primary team.   Principal Problem:   E coli bacteremia Active Problems:   AKI (acute kidney injury) (HCC)   Type 2 diabetes mellitus with hypoglycemia without coma (HCC)   Hypothyroidism   Severe sepsis (HCC)   Acute metabolic encephalopathy   Acute hyponatremia   Thrombocytopenia (HCC)   Chronic anemia   Chronic diastolic CHF (congestive heart failure) (HCC)   Discitis of lumbar region   Sepsis with acute renal failure without septic shock (HCC)   Wheeze   Pancytopenia (HCC)    sodium chloride   Intravenous Once   arformoterol  15 mcg Nebulization BID   calcium carbonate  800 mg of elemental calcium Oral BID   calcium-vitamin D  0.5 tablet Oral BID   Chlorhexidine Gluconate Cloth  6 each Topical Daily   fluticasone  1 spray Each Nare Daily   gabapentin  300 mg Oral BID   insulin aspart  0-15 Units Subcutaneous Q4H   levothyroxine  150 mcg Oral Q0600   lidocaine  1 patch Transdermal Daily   loratadine  10 mg Oral Daily   mouth rinse  15 mL Mouth Rinse BID   mupirocin  ointment  1 application Nasal BID   nystatin   Topical BID   pantoprazole  40 mg Oral Daily   polyethylene glycol  17 g Oral Daily   pramipexole  1.5 mg Oral TID   revefenacin  175 mcg Nebulization Daily   sodium chloride flush  10-40 mL Intracatheter Q12H   sodium chloride  1 g Oral TID WC    SUBJECTIVE:  Jillian Leblanc was last seen by Dr. Daiva Eves on 12/24/20 with plan for 6 weeks of Cefazolin for E. Coli bacteremia with vertebral infection. Noted to have worsening back pain and urinary retention and CT lumbar spine with further destruction of the L5 vertebral body. Brought to the OR for L4/L5 laminectomies and evacuation of epidural abscess with L3 to S2 / pelves posterolateral fusion. Currently on Cefazolin. She appears to have delirium and is able to answer a few basic questions. Nursing reports this has been her baseline for the last several days.   Allergies  Allergen Reactions   Pentosan Polysulfate Sodium     Other reaction(s): Other (See Comments) ELMIRON Y Drug hair fell out 10/21/2009 12:00:00 AM by Johny Blamer CNA 1   Elmiron [Pentosan Polysulfate]    Naloxone Other (See Comments)    Hallucinations Confusion Nightmares     Review of Systems: Review of Systems  Unable to perform ROS: Medical condition     OBJECTIVE: Vitals:   01/05/21 9758 01/05/21 0859  01/05/21 1210 01/05/21 1300  BP: (!) 144/55  (!) 112/32 98/72  Pulse: 95  100   Resp: 14  19   Temp:   100.1 F (37.8 C)   TempSrc:   Oral   SpO2: 97% 96% 96%   Weight:      Height:       Body mass index is 37.89 kg/m.  Physical Exam Constitutional:      General: She is not in acute distress.    Appearance: She is well-developed.     Comments: Lying in the bed with head of bed elevated; hallucinating   Cardiovascular:     Rate and Rhythm: Normal rate and regular rhythm.     Heart sounds: Normal heart sounds.  Pulmonary:     Effort: Pulmonary effort is normal.     Breath sounds: Normal breath sounds.   Skin:    General: Skin is warm and dry.  Neurological:     Mental Status: She is alert and oriented to person, place, and time.  Psychiatric:        Behavior: Behavior normal.        Thought Content: Thought content normal.        Judgment: Judgment normal.    Lab Results Lab Results  Component Value Date   WBC 6.1 01/05/2021   HGB 7.1 (L) 01/05/2021   HCT 20.7 (L) 01/05/2021   MCV 87.0 01/05/2021   PLT 201 01/05/2021    Lab Results  Component Value Date   CREATININE 1.05 (H) 01/05/2021   BUN 16 01/05/2021   NA 128 (L) 01/05/2021   K 3.2 (L) 01/05/2021   CL 94 (L) 01/05/2021   CO2 23 01/05/2021    Lab Results  Component Value Date   ALT 22 12/22/2020   AST 25 12/22/2020   GGT 109 (H) 11/23/2020   ALKPHOS 257 (H) 12/22/2020   BILITOT 0.8 12/22/2020     Microbiology: Recent Results (from the past 240 hour(s))  Resp Panel by RT-PCR (Flu A&B, Covid) Nasopharyngeal Swab     Status: None   Collection Time: 12/29/20  3:29 PM   Specimen: Nasopharyngeal Swab; Nasopharyngeal(NP) swabs in vial transport medium  Result Value Ref Range Status   SARS Coronavirus 2 by RT PCR NEGATIVE NEGATIVE Final    Comment: (NOTE) SARS-CoV-2 target nucleic acids are NOT DETECTED.  The SARS-CoV-2 RNA is generally detectable in upper respiratory specimens during the acute phase of infection. The lowest concentration of SARS-CoV-2 viral copies this assay can detect is 138 copies/mL. A negative result does not preclude SARS-Cov-2 infection and should not be used as the sole basis for treatment or other patient management decisions. A negative result may occur with  improper specimen collection/handling, submission of specimen other than nasopharyngeal swab, presence of viral mutation(s) within the areas targeted by this assay, and inadequate number of viral copies(<138 copies/mL). A negative result must be combined with clinical observations, patient history, and  epidemiological information. The expected result is Negative.  Fact Sheet for Patients:  BloggerCourse.com  Fact Sheet for Healthcare Providers:  SeriousBroker.it  This test is no t yet approved or cleared by the [place] FDA and  has been authorized for detection and/or diagnosis of SARS-CoV-2 by FDA under an Emergency Use Authorization (EUA). This EUA will remain  in effect (meaning this test can be used) for the duration of the COVID-19 declaration under Section 564(b)(1) of the Act, 21 U.S.C.section 360bbb-3(b)(1), unless the authorization is terminated  or revoked  sooner.       Influenza A by PCR NEGATIVE NEGATIVE Final   Influenza B by PCR NEGATIVE NEGATIVE Final    Comment: (NOTE) The Xpert Xpress SARS-CoV-2/FLU/RSV plus assay is intended as an aid in the diagnosis of influenza from Nasopharyngeal swab specimens and should not be used as a sole basis for treatment. Nasal washings and aspirates are unacceptable for Xpert Xpress SARS-CoV-2/FLU/RSV testing.  Fact Sheet for Patients: EntrepreneurPulse.com.au  Fact Sheet for Healthcare Providers: IncredibleEmployment.be  This test is not yet approved or cleared by the [place] FDA and has been authorized for detection and/or diagnosis of SARS-CoV-2 by FDA under an Emergency Use Authorization (EUA). This EUA will remain in effect (meaning this test can be used) for the duration of the COVID-19 declaration under Section 564(b)(1) of the Act, 21 U.S.C. section 360bbb-3(b)(1), unless the authorization is terminated or revoked.  Performed at [facility], [place] [address] [place], [place] [address]   Culture, blood (routine x 2)     Status: None   Collection Time: 12/30/20 11:33 AM   Specimen: BLOOD RIGHT HAND  Result Value Ref Range Status   Specimen Description BLOOD RIGHT HAND  Final   Special Requests   Final     BOTTLES DRAWN AEROBIC AND ANAEROBIC Blood Culture adequate volume   Culture   Final    NO GROWTH 5 DAYS Performed at [facility], [place] [address], [place], [place] [address]    Report Status 01/04/2021 FINAL  Final  Culture, blood (routine x 2)     Status: None   Collection Time: 12/30/20 11:43 AM   Specimen: BLOOD RIGHT HAND  Result Value Ref Range Status   Specimen Description BLOOD RIGHT HAND  Final   Special Requests   Final    BOTTLES DRAWN AEROBIC AND ANAEROBIC Blood Culture adequate volume   Culture   Final    NO GROWTH 5 DAYS Performed at [facility], [place] [address] [place], [place] [address]    Report Status 01/04/2021 FINAL  Final  Surgical pcr screen     Status: Abnormal   Collection Time: 01/04/21  1:07 PM   Specimen: Nasal Mucosa; Nasal Swab  Result Value Ref Range Status   MRSA, PCR POSITIVE (A) NEGATIVE Final    Comment: RESULT CALLED TO, READ BACK BY AND VERIFIED WITH: RM C GANOE [id number] BY CM    Staphylococcus aureus POSITIVE (A) NEGATIVE Final    Comment: (NOTE) The Xpert SA Assay (FDA approved for NASAL specimens in patients 47 years of age and older), is one component of a comprehensive surveillance program. It is not intended to diagnose infection nor to guide or monitor treatment. Performed at [facility], [place] [address] [place], [place] [address]   Aerobic/Anaerobic Culture w Gram Stain (surgical/deep wound)     Status: None (Preliminary result)   Collection Time: 01/04/21  6:48 PM   Specimen: PATH Other; Tissue  Result Value Ref Range Status   Specimen Description ABSCESS  Final   Special Requests EPIDURAL PT ON ANCEF  Final   Gram Stain NO WBC SEEN NO ORGANISMS SEEN   Final   Culture   Final    TOO YOUNG TO READ Performed at [facility], [address], [place], [place] [address]    Report Status PENDING  Incomplete     Terri Piedra, [facility] [place]  [facility]  01/05/2021  3:17 PM

## 2021-01-05 NOTE — Plan of Care (Signed)
  Problem: Education: °Goal: Knowledge of General Education information will improve °Description: Including pain rating scale, medication(s)/side effects and non-pharmacologic comfort measures °Outcome: Progressing °  °Problem: Health Behavior/Discharge Planning: °Goal: Ability to manage health-related needs will improve °Outcome: Progressing °  °Problem: Clinical Measurements: °Goal: Ability to maintain clinical measurements within normal limits will improve °Outcome: Progressing °Goal: Will remain free from infection °Outcome: Progressing °Goal: Diagnostic test results will improve °Outcome: Progressing °Goal: Respiratory complications will improve °Outcome: Progressing °Goal: Cardiovascular complication will be avoided °Outcome: Progressing °  °Problem: Activity: °Goal: Risk for activity intolerance will decrease °Outcome: Progressing °  °Problem: Nutrition: °Goal: Adequate nutrition will be maintained °Outcome: Progressing °  °Problem: Coping: °Goal: Level of anxiety will decrease °Outcome: Progressing °  °Problem: Elimination: °Goal: Will not experience complications related to bowel motility °Outcome: Progressing °Goal: Will not experience complications related to urinary retention °Outcome: Progressing °  °Problem: Pain Managment: °Goal: General experience of comfort will improve °Outcome: Progressing °  °Problem: Safety: °Goal: Ability to remain free from injury will improve °Outcome: Progressing °  °Problem: Skin Integrity: °Goal: Risk for impaired skin integrity will decrease °Outcome: Progressing °  °Problem: Fluid Volume: °Goal: Hemodynamic stability will improve °Outcome: Progressing °  °Problem: Clinical Measurements: °Goal: Diagnostic test results will improve °Outcome: Progressing °Goal: Signs and symptoms of infection will decrease °Outcome: Progressing °  °Problem: Respiratory: °Goal: Ability to maintain adequate ventilation will improve °Outcome: Progressing °  °Problem: Urinary  Elimination: °Goal: Signs and symptoms of infection will decrease °Outcome: Progressing °  °Problem: Education: °Goal: Knowledge of disease or condition will improve °Outcome: Progressing °Goal: Understanding of medication regimen will improve °Outcome: Progressing °Goal: Individualized Educational Video(s) °Outcome: Progressing °  °Problem: Activity: °Goal: Ability to tolerate increased activity will improve °Outcome: Progressing °  °Problem: Cardiac: °Goal: Ability to achieve and maintain adequate cardiopulmonary perfusion will improve °Outcome: Progressing °  °Problem: Health Behavior/Discharge Planning: °Goal: Ability to safely manage health-related needs after discharge will improve °Outcome: Progressing °  °

## 2021-01-06 ENCOUNTER — Inpatient Hospital Stay (HOSPITAL_COMMUNITY): Payer: Medicare Other

## 2021-01-06 LAB — BPAM RBC
Blood Product Expiration Date: 202301012359
Blood Product Expiration Date: 202301012359
Blood Product Expiration Date: 202301012359
ISSUE DATE / TIME: 202212121800
ISSUE DATE / TIME: 202212121800
ISSUE DATE / TIME: 202212131852
Unit Type and Rh: 600
Unit Type and Rh: 600
Unit Type and Rh: 600

## 2021-01-06 LAB — URINALYSIS, MICROSCOPIC (REFLEX): WBC, UA: >50 WBC/hpf (ref 0–5)

## 2021-01-06 LAB — GLUCOSE, CAPILLARY
Glucose-Capillary: 130 mg/dL — ABNORMAL HIGH (ref 70–99)
Glucose-Capillary: 130 mg/dL — ABNORMAL HIGH (ref 70–99)
Glucose-Capillary: 133 mg/dL — ABNORMAL HIGH (ref 70–99)
Glucose-Capillary: 134 mg/dL — ABNORMAL HIGH (ref 70–99)
Glucose-Capillary: 156 mg/dL — ABNORMAL HIGH (ref 70–99)
Glucose-Capillary: 89 mg/dL (ref 70–99)

## 2021-01-06 LAB — CBC
HCT: 18.7 % — ABNORMAL LOW (ref 36.0–46.0)
HCT: 24.1 % — ABNORMAL LOW (ref 36.0–46.0)
Hemoglobin: 6.5 g/dL — CL (ref 12.0–15.0)
Hemoglobin: 8 g/dL — ABNORMAL LOW (ref 12.0–15.0)
MCH: 30.4 pg (ref 26.0–34.0)
MCH: 29.3 pg (ref 26.0–34.0)
MCHC: 34.8 g/dL (ref 30.0–36.0)
MCHC: 33.2 g/dL (ref 30.0–36.0)
MCV: 87.4 fL (ref 80.0–100.0)
MCV: 88.3 fL (ref 80.0–100.0)
Platelets: 181 10*3/uL (ref 150–400)
Platelets: 190 10*3/uL (ref 150–400)
RBC: 2.14 MIL/uL — ABNORMAL LOW (ref 3.87–5.11)
RBC: 2.73 MIL/uL — ABNORMAL LOW (ref 3.87–5.11)
RDW: 15.4 % (ref 11.5–15.5)
RDW: 16.6 % — ABNORMAL HIGH (ref 11.5–15.5)
WBC: 5.3 10*3/uL (ref 4.0–10.5)
WBC: 5.7 10*3/uL (ref 4.0–10.5)
nRBC: 0 % (ref 0.0–0.2)
nRBC: 0 % (ref 0.0–0.2)

## 2021-01-06 LAB — RENAL FUNCTION PANEL
Albumin: 2.6 g/dL — ABNORMAL LOW (ref 3.5–5.0)
Anion gap: 8 (ref 5–15)
BUN: 9 mg/dL (ref 8–23)
CO2: 28 mmol/L (ref 22–32)
Calcium: 7.3 mg/dL — ABNORMAL LOW (ref 8.9–10.3)
Chloride: 93 mmol/L — ABNORMAL LOW (ref 98–111)
Creatinine, Ser: 0.8 mg/dL (ref 0.44–1.00)
GFR, Estimated: >60 mL/min (ref >60)
Glucose, Bld: 135 mg/dL — ABNORMAL HIGH (ref 70–99)
Phosphorus: 2.9 mg/dL (ref 2.5–4.6)
Potassium: 3.8 mmol/L (ref 3.5–5.1)
Sodium: 129 mmol/L — ABNORMAL LOW (ref 135–145)

## 2021-01-06 LAB — TYPE AND SCREEN
ABO/RH(D): A NEG
Antibody Screen: NEGATIVE
Unit division: 0
Unit division: 0
Unit division: 0

## 2021-01-06 LAB — HEPATIC FUNCTION PANEL
ALT: <5 U/L (ref 0–44)
AST: 22 U/L (ref 15–41)
Albumin: 2.7 g/dL — ABNORMAL LOW (ref 3.5–5.0)
Alkaline Phosphatase: 147 U/L — ABNORMAL HIGH (ref 38–126)
Bilirubin, Direct: 1.1 mg/dL — ABNORMAL HIGH (ref 0.0–0.2)
Indirect Bilirubin: 1.5 mg/dL — ABNORMAL HIGH (ref 0.3–0.9)
Total Bilirubin: 2.6 mg/dL — ABNORMAL HIGH (ref 0.3–1.2)
Total Protein: 6.2 g/dL — ABNORMAL LOW (ref 6.5–8.1)

## 2021-01-06 LAB — URINALYSIS, ROUTINE W REFLEX MICROSCOPIC
Glucose, UA: 100 mg/dL — AB
Ketones, ur: 15 mg/dL — AB
Nitrite: POSITIVE — AB
Protein, ur: 100 mg/dL — AB
Specific Gravity, Urine: 1.015 (ref 1.005–1.030)
pH: 7.5 (ref 5.0–8.0)

## 2021-01-06 LAB — AMMONIA: Ammonia: 22 umol/L (ref 9–35)

## 2021-01-06 LAB — VITAMIN B12: Vitamin B-12: 333 pg/mL (ref 180–914)

## 2021-01-06 LAB — ACTH STIMULATION, 3 TIME POINTS
Cortisol, 30 Min: 24.3 ug/dL
Cortisol, 60 Min: 27.8 ug/dL
Cortisol, Base: 20.8 ug/dL

## 2021-01-06 MED ORDER — SODIUM CHLORIDE 1 G PO TABS
1.0000 g | ORAL_TABLET | Freq: Two times a day (BID) | ORAL | Status: DC
  Administered 2021-01-06 – 2021-01-08 (×5): 1 g via ORAL
  Filled 2021-01-06 (×6): qty 1

## 2021-01-06 MED ORDER — QUETIAPINE FUMARATE 25 MG PO TABS
25.0000 mg | ORAL_TABLET | Freq: Every day | ORAL | Status: DC
  Administered 2021-01-06 – 2021-01-08 (×3): 25 mg via ORAL
  Filled 2021-01-06 (×3): qty 1

## 2021-01-06 MED ORDER — ACETAMINOPHEN 500 MG PO TABS
1000.0000 mg | ORAL_TABLET | Freq: Three times a day (TID) | ORAL | Status: DC
  Administered 2021-01-06 – 2021-01-08 (×8): 1000 mg via ORAL
  Filled 2021-01-06 (×8): qty 2

## 2021-01-06 MED ORDER — HALOPERIDOL LACTATE 5 MG/ML IJ SOLN
2.0000 mg | Freq: Four times a day (QID) | INTRAMUSCULAR | Status: DC | PRN
  Administered 2021-01-06: 11:00:00 2 mg via INTRAVENOUS
  Filled 2021-01-06: qty 1

## 2021-01-06 NOTE — Plan of Care (Signed)
°  Problem: Education: Goal: Knowledge of General Education information will improve Description: Including pain rating scale, medication(s)/side effects and non-pharmacologic comfort measures Outcome: Progressing   Problem: Health Behavior/Discharge Planning: Goal: Ability to manage health-related needs will improve Outcome: Progressing   Problem: Clinical Measurements: Goal: Ability to maintain clinical measurements within normal limits will improve Outcome: Progressing Goal: Will remain free from infection Outcome: Progressing Goal: Diagnostic test results will improve Outcome: Progressing Goal: Respiratory complications will improve Outcome: Progressing Goal: Cardiovascular complication will be avoided Outcome: Progressing   Problem: Activity: Goal: Risk for activity intolerance will decrease Outcome: Progressing   Problem: Nutrition: Goal: Adequate nutrition will be maintained Outcome: Progressing   Problem: Coping: Goal: Level of anxiety will decrease Outcome: Progressing   Problem: Elimination: Goal: Will not experience complications related to bowel motility Outcome: Progressing Goal: Will not experience complications related to urinary retention Outcome: Progressing   Problem: Pain Managment: Goal: General experience of comfort will improve Outcome: Progressing   Problem: Safety: Goal: Ability to remain free from injury will improve Outcome: Progressing   Problem: Skin Integrity: Goal: Risk for impaired skin integrity will decrease Outcome: Progressing   Problem: Fluid Volume: Goal: Hemodynamic stability will improve Outcome: Progressing   Problem: Clinical Measurements: Goal: Diagnostic test results will improve Outcome: Progressing Goal: Signs and symptoms of infection will decrease Outcome: Progressing   Problem: Respiratory: Goal: Ability to maintain adequate ventilation will improve Outcome: Progressing   Problem: Urinary  Elimination: Goal: Signs and symptoms of infection will decrease Outcome: Progressing   Problem: Education: Goal: Knowledge of disease or condition will improve Outcome: Progressing Goal: Understanding of medication regimen will improve Outcome: Progressing Goal: Individualized Educational Video(s) Outcome: Progressing   Problem: Activity: Goal: Ability to tolerate increased activity will improve Outcome: Progressing   Problem: Cardiac: Goal: Ability to achieve and maintain adequate cardiopulmonary perfusion will improve Outcome: Progressing   Problem: Health Behavior/Discharge Planning: Goal: Ability to safely manage health-related needs after discharge will improve Outcome: Progressing

## 2021-01-06 NOTE — Progress Notes (Signed)
Neurosurgery Service Progress Note  Subjective: No acute events overnight, back pain minimal today, she's very happy  Objective: Vitals:   01/06/21 0854 01/06/21 0954 01/06/21 1059 01/06/21 1151  BP: 107/71 (!) 85/64 (!) 97/53 (!) 143/54  Pulse: (!) 116 (!) 109  93  Resp: 20 20 18 19   Temp: (!) 101.2 F (38.4 C) 99.8 F (37.7 C) 99.7 F (37.6 C) 99.2 F (37.3 C)  TempSrc: Oral Oral Oral Oral  SpO2: 93%     Weight:      Height:        Physical Exam: Strength 5/5 x4 except 4+/5 dorsiflexion weakness - difficult to assess 2/2 poor cooperation with exam, SILTx4, incision c/d/i   Assessment & Plan: 75 y.o. woman w/ bacteremia, large epidural abscess, osteomyelitis with pathologic fracture, s/p decompression / epidural abscess evacuation and PSIF.  -activity as tolerated -okay for DVT chemoprophylaxis today  67  01/06/21 3:48 PM

## 2021-01-06 NOTE — Progress Notes (Signed)
PROGRESS NOTE        PATIENT DETAILS Name: Jillian Leblanc Age: 75 y.o. Sex: female Date of Birth: [birth year] Admit Date: 12/18/2020 Admitting Physician Angie Fava, DO QIW:[id number], Idelia Salm, MD  Brief Narrative: [name] a 75 y.o. 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 coli bacteremia-along with vertebral osteomyelitis involving L4-S1.  Initially managed in the ICU-upon stability-transferred to the [facility].  Patient was evaluated by neurosurgery, ID-started on appropriate antimicrobial therapy-neurosurgery felt that patient could be watched with IV antibiotics.  However for the hospital course was complicated by worsening back pain-repeat CT L-spine on 12/12-showed worsening findings-subsequently neurosurgery performed L4-L5 laminectomy and evacuation of epidural abscess.  See below for further details.  Subjective: Confused-uncomfortable-febrile-this morning.  Although confused able to follow some commands.  Objective: Vitals: Blood pressure (!) 143/54, pulse 93, temperature 99.2 F (37.3 C), temperature source Oral, resp. rate 19, height 5' (1.524 m), weight 92 kg, SpO2 93 %.   Exam: Gen Exam: Confused-but still able to follow simple commands. HEENT:atraumatic, normocephalic Chest: B/L clear to auscultation anteriorly CVS:S1S2 regular Abdomen:soft non tender, non distended Extremities:no edema Neurology: Difficult exam but moving both lower extremities-able to bend both knees-seems to be able to lift both legs off the bed. Skin: no rash    Pertinent Labs/Radiology: Recent Labs  Lab 01/06/21 0554 01/06/21 0951  WBC 5.7  --   HGB 8.0*  --   PLT 190  --   NA 129*  --   K 3.8  --   CREATININE 0.80  --   AST  --  22  ALT  --  <5  ALKPHOS  --  147*   BILITOT  --  2.6*      Assessment/Plan: Septic shock due to E. coli bacteremia-L4-S1 osteomyelitis with epidural abscess: Sepsis physiology had improved-remains on IV Ancef-initially plans were nonsurgical management and IV antibiotics-however patient developed worsening pain over the weekend-reevaluated by neurosurgery and underwent L4-L5 laminectomy and evacuation of the epidural abscess.  She is febrile this morning-discussed with ID-she could have had microaspiration in the setting of confusion-have repeated blood cultures today.  Intraoperative cultures showing gram-negative rods-plans are to continue with IV Ancef and follow closely.    Acute metabolic encephalopathy: Due to septic shock/AKI/hospital delirium-mental status had improved-however has developed worsening delirium in the postop setting-suspect this is due to fever (103 F this morning)/narcotic use.  Ammonia/vitamin B12 levels are normal.  Continue to treat underlying epidural abscess/bacteremia-minimize narcotics as much as possible-use as needed IV Haldol.  Acute hypoxic respiratory failure: Multifactorial etiology-due to asthma exacerbation and pulmonary edema.  Continues to clinically improve-with supportive care and diuretic regimen-on minimal amount of oxygen.  No longer on steroids.  Acute urinary retention: Likely due to pain and epidural abscess-Foley catheter remains in place-once mental status improves-we will attempt a voiding trial.  Hyponatremia: Suspect this was due to volume overload-has responded to IV Lasix/salt tablets and IV albumin-appreciate nephrology input.  Maintain fluid restrictions-continue salt tablets-hold further diuretics.  Hypokalemia: Repleted.  AKI: Mild-felt to be due to intravascular volume depletion-responding well to IV albumin/Lasix combination.  PAF with RVR: Maintaining sinus rhythm-initially required amiodarone-reviewed cardiology note-anticoagulation issue to be addressed upon  outpatient follow-up.  Thrombocytopenia: Resolved-likely due to sepsis.  Anemia: Multifactorial etiology-but due to acute illness-some amount of perioperative blood loss anemia-did require 1 unit of PRBC transfusion on 12/13-CBC currently stable-watch closely for  HFpEF: volume overload is due to hypoalbuminemia/third spacing rather than decompensated heart failure.  Volume status has stabilized following several doses of IV Lasix.  DM-2 (A1c 8.27 October 2020): CBGs stable-no longer on Levemir-continue SSI and follow trend.    Recent Labs    01/06/21 0425 01/06/21 0806 01/06/21 1231  GLUCAP 130* 133* 134*     Hypothyroidism: Continue Synthroid-both TSH/free T4 elevated on recent labs-likely due to sick euthyroid syndrome-recommendations are to repeat thyroid function test in a few weeks.  GERD: Continue PPI  Dysphagia: Tolerating dysphagia 3 diet-I suspect this is from deconditioning.  SLP following.  Left elbow pain: Seems to have improved-CT elbow on 12/10-does not show any collection-shows cellulitis.  Already on IV Ancef.  Dopplers negative for DVT as well.  For now supportive care.   Morbid Obesity Estimated body mass index is 39.61 kg/m as calculated from the following:   Height as of this encounter: 5' (1.524 m).   Weight as of this encounter: 92 kg.    Procedures: 12/12>>L4, L5 laminectomies, evacuation of epidural abscess, L3 to S2 / pelvis posterolateral instrumented fusion Consults:PCCM, ID, neurosurgery DVT Prophylaxis: Heparin Code Status:Full code  Family Communication: Daughter-Wendy-[phone] updated over the phone on 12/14  Time spent: 35 minutes-Greater than 50% of this time was spent in counseling, explanation of diagnosis, planning of further management, and coordination of care.   Disposition Plan: Status is: Inpatient  Remains inpatient appropriate because: Bacteremia-epidural abscess-lumbar osteomyelitis-febrile-hyponatremia-not yet stable for  discharge.    Diet: Diet Order             Diet heart healthy/carb modified Room service appropriate? No; Fluid consistency: Thin; Fluid restriction: 1500 mL Fluid  Diet effective now                     Antimicrobial agents: Anti-infectives (From admission, onward)    Start     Dose/Rate Route Frequency Ordered Stop   12/21/20 1800  ceFAZolin (ANCEF) IVPB 2g/100 mL premix        2 g 200 mL/hr over 30 Minutes Intravenous Every 8 hours 12/21/20 1216 03/01/21 2359   12/20/20 1800  cefTRIAXone (ROCEPHIN) 2 g in sodium chloride 0.9 % 100 mL IVPB  Status:  Discontinued        2 g 200 mL/hr over 30 Minutes Intravenous Every 24 hours 12/20/20 0920 12/21/20 1216   12/20/20 0100  meropenem (MERREM) 1 g in sodium chloride 0.9 % 100 mL IVPB  Status:  Discontinued        1 g 200 mL/hr over 30 Minutes Intravenous Every 8 hours 12/20/20 0004 12/20/20 0920   12/19/20 2000  ceFEPIme (MAXIPIME) 2 g in sodium chloride 0.9 % 100 mL IVPB  Status:  Discontinued        2 g 200 mL/hr over 30 Minutes Intravenous Every 24 hours 12/19/20 0057 12/19/20 1023   12/19/20 2000  cefTRIAXone (ROCEPHIN) 2 g in sodium chloride 0.9 % 100 mL IVPB  Status:  Discontinued        2 g 200 mL/hr over 30 Minutes Intravenous Every 24 hours 12/19/20 1023 12/20/20 0004   12/19/20 0057  vancomycin variable dose per unstable renal function (pharmacist dosing)  Status:  Discontinued         Does not  apply See admin instructions 12/19/20 0057 12/19/20 1023   12/18/20 2000  ceFEPIme (MAXIPIME) 2 g in sodium chloride 0.9 % 100 mL IVPB        2 g 200 mL/hr over 30 Minutes Intravenous  Once 12/18/20 1955 12/18/20 2050   12/18/20 2000  metroNIDAZOLE (FLAGYL) IVPB 500 mg        500 mg 100 mL/hr over 60 Minutes Intravenous  Once 12/18/20 1955 12/18/20 2210   12/18/20 2000  vancomycin (VANCOREADY) IVPB 1750 mg/350 mL        1,750 mg 175 mL/hr over 120 Minutes Intravenous  Once 12/18/20 1955 12/19/20 0045         MEDICATIONS: Scheduled Meds:  sodium chloride   Intravenous Once   acetaminophen  1,000 mg Oral Q8H   arformoterol  15 mcg Nebulization BID   calcium carbonate  800 mg of elemental calcium Oral BID   calcium-vitamin D  0.5 tablet Oral BID   Chlorhexidine Gluconate Cloth  6 each Topical Daily   fluticasone  1 spray Each Nare Daily   gabapentin  300 mg Oral BID   insulin aspart  0-15 Units Subcutaneous Q4H   levothyroxine  150 mcg Oral Q0600   lidocaine  1 patch Transdermal Daily   loratadine  10 mg Oral Daily   mouth rinse  15 mL Mouth Rinse BID   mupirocin ointment  1 application Nasal BID   nystatin   Topical BID   pantoprazole  40 mg Oral Daily   polyethylene glycol  17 g Oral Daily   pramipexole  1.5 mg Oral TID   revefenacin  175 mcg Nebulization Daily   sodium chloride flush  10-40 mL Intracatheter Q12H   sodium chloride  1 g Oral BID WC   Continuous Infusions:  sodium chloride Stopped (12/21/20 1240)   sodium chloride      ceFAZolin (ANCEF) IV 2 g (01/06/21 0830)   PRN Meds:.sodium chloride, benzonatate, dextromethorphan, docusate sodium, guaiFENesin, haloperidol lactate, HYDROmorphone (DILAUDID) injection, ipratropium-albuterol, lip balm, methocarbamol, naLOXone (NARCAN)  injection, oxyCODONE, phenol, sodium chloride flush   I have personally reviewed following labs and imaging studies  LABORATORY DATA: CBC: Recent Labs  Lab 01/03/21 0706 01/04/21 0508 01/04/21 2044 01/05/21 0453 01/05/21 1648 01/06/21 0554  WBC 9.8 6.1  --  6.1 5.3 5.7  HGB 7.8* 7.3* 8.2* 7.1* 6.5* 8.0*  HCT 23.6* 22.3* 24.0* 20.7* 18.7* 24.1*  MCV 87.4 88.5  --  87.0 87.4 88.3  PLT 294 242  --  201 181 190     Basic Metabolic Panel: Recent Labs  Lab 01/04/21 0508 01/04/21 1644 01/04/21 2044 01/05/21 0453 01/05/21 1648 01/06/21 0554  NA 123* 127* 129* 128* 131* 129*  K 3.6 3.1* 3.4* 3.2* 3.0* 3.8  CL 87* 91* 88* 94* 95* 93*  CO2 26 27  --  23 27 28   GLUCOSE 113* 91 110*  132* 143* 135*  BUN 19 19 18 16 12 9   CREATININE 1.10* 1.10* 0.90 1.05* 0.88 0.80  CALCIUM 8.1* 8.0*  --  7.3* 7.5* 7.3*  PHOS 3.7 4.1  --  2.8 2.4* 2.9     GFR: Estimated Creatinine Clearance: 63.4 mL/min (by C-G formula based on SCr of 0.8 mg/dL).  Liver Function Tests: Recent Labs  Lab 01/04/21 1644 01/05/21 0453 01/05/21 1648 01/06/21 0554 01/06/21 0951  AST  --   --   --   --  22  ALT  --   --   --   --  <  5  ALKPHOS  --   --   --   --  147*  BILITOT  --   --   --   --  2.6*  PROT  --   --   --   --  6.2*  ALBUMIN 2.8* 2.3* 2.6* 2.6* 2.7*    No results for input(s): LIPASE, AMYLASE in the last 168 hours. Recent Labs  Lab 01/03/21 0706 01/06/21 0951  AMMONIA 21 22     Coagulation Profile: No results for input(s): INR, PROTIME in the last 168 hours.  Cardiac Enzymes: No results for input(s): CKTOTAL, CKMB, CKMBINDEX, TROPONINI in the last 168 hours.  BNP (last 3 results) No results for input(s): PROBNP in the last 8760 hours.  Lipid Profile: No results for input(s): CHOL, HDL, LDLCALC, TRIG, CHOLHDL, LDLDIRECT in the last 72 hours.  Thyroid Function Tests: No results for input(s): TSH, T4TOTAL, FREET4, T3FREE, THYROIDAB in the last 72 hours.  Anemia Panel: Recent Labs    01/06/21 0951  VITAMINB12 333    Urine analysis:    Component Value Date/Time   COLORURINE YELLOW 01/06/2021 0803   APPEARANCEUR HAZY (A) 01/06/2021 0803   LABSPEC 1.015 01/06/2021 0803   PHURINE 7.5 01/06/2021 0803   GLUCOSEU 100 (A) 01/06/2021 0803   HGBUR MODERATE (A) 01/06/2021 0803   BILIRUBINUR SMALL (A) 01/06/2021 0803   KETONESUR 15 (A) 01/06/2021 0803   PROTEINUR 100 (A) 01/06/2021 0803   UROBILINOGEN 0.2 09/15/2012 1536   NITRITE POSITIVE (A) 01/06/2021 0803   LEUKOCYTESUR MODERATE (A) 01/06/2021 0803    Sepsis Labs: Lactic Acid, Venous    Component Value Date/Time   LATICACIDVEN 1.5 12/20/2020 0429    MICROBIOLOGY: Recent Results (from the past 240  hour(s))  Resp Panel by RT-PCR (Flu A&B, Covid) Nasopharyngeal Swab     Status: None   Collection Time: 12/29/20  3:29 PM   Specimen: Nasopharyngeal Swab; Nasopharyngeal(NP) swabs in vial transport medium  Result Value Ref Range Status   SARS Coronavirus 2 by RT PCR NEGATIVE NEGATIVE Final    Comment: (NOTE) SARS-CoV-2 target nucleic acids are NOT DETECTED.  The SARS-CoV-2 RNA is generally detectable in upper respiratory specimens during the acute phase of infection. The lowest concentration of SARS-CoV-2 viral copies this assay can detect is 138 copies/mL. A negative result does not preclude SARS-Cov-2 infection and should not be used as the sole basis for treatment or other patient management decisions. A negative result may occur with  improper specimen collection/handling, submission of specimen other than nasopharyngeal swab, presence of viral mutation(s) within the areas targeted by this assay, and inadequate number of viral copies(<138 copies/mL). A negative result must be combined with clinical observations, patient history, and epidemiological information. The expected result is Negative.  Fact Sheet for Patients:  EntrepreneurPulse.com.au  Fact Sheet for Healthcare Providers:  IncredibleEmployment.be  This test is no t yet approved or cleared by the [place] FDA and  has been authorized for detection and/or diagnosis of SARS-CoV-2 by FDA under an Emergency Use Authorization (EUA). This EUA will remain  in effect (meaning this test can be used) for the duration of the COVID-19 declaration under Section 564(b)(1) of the Act, 21 U.S.C.section 360bbb-3(b)(1), unless the authorization is terminated  or revoked sooner.       Influenza A by PCR NEGATIVE NEGATIVE Final   Influenza B by PCR NEGATIVE NEGATIVE Final    Comment: (NOTE) The Xpert Xpress SARS-CoV-2/FLU/RSV plus assay is intended as an aid in the diagnosis  of influenza from  Nasopharyngeal swab specimens and should not be used as a sole basis for treatment. Nasal washings and aspirates are unacceptable for Xpert Xpress SARS-CoV-2/FLU/RSV testing.  Fact Sheet for Patients: EntrepreneurPulse.com.au  Fact Sheet for Healthcare Providers: IncredibleEmployment.be  This test is not yet approved or cleared by the [place] FDA and has been authorized for detection and/or diagnosis of SARS-CoV-2 by FDA under an Emergency Use Authorization (EUA). This EUA will remain in effect (meaning this test can be used) for the duration of the COVID-19 declaration under Section 564(b)(1) of the Act, 21 U.S.C. section 360bbb-3(b)(1), unless the authorization is terminated or revoked.  Performed at [facility], [place] [address], [place], [place] [address]   Culture, blood (routine x 2)     Status: None   Collection Time: 12/30/20 11:33 AM   Specimen: BLOOD RIGHT HAND  Result Value Ref Range Status   Specimen Description BLOOD RIGHT HAND  Final   Special Requests   Final    BOTTLES DRAWN AEROBIC AND ANAEROBIC Blood Culture adequate volume   Culture   Final    NO GROWTH 5 DAYS Performed at [facility], [place] [address] [place], [place] [address]    Report Status 01/04/2021 FINAL  Final  Culture, blood (routine x 2)     Status: None   Collection Time: 12/30/20 11:43 AM   Specimen: BLOOD RIGHT HAND  Result Value Ref Range Status   Specimen Description BLOOD RIGHT HAND  Final   Special Requests   Final    BOTTLES DRAWN AEROBIC AND ANAEROBIC Blood Culture adequate volume   Culture   Final    NO GROWTH 5 DAYS Performed at [facility], [place] [address], [place], [place] [address]    Report Status 01/04/2021 FINAL  Final  Surgical pcr screen     Status: Abnormal   Collection Time: 01/04/21  1:07 PM   Specimen: Nasal Mucosa; Nasal Swab  Result Value Ref Range Status   MRSA, PCR POSITIVE (A) NEGATIVE Final     Comment: RESULT CALLED TO, READ BACK BY AND VERIFIED WITH: RM C GANOE ES:[id number] AT 1443 BY CM    Staphylococcus aureus POSITIVE (A) NEGATIVE Final    Comment: (NOTE) The Xpert SA Assay (FDA approved for NASAL specimens in patients 27 years of age and older), is one component of a comprehensive surveillance program. It is not intended to diagnose infection nor to guide or monitor treatment. Performed at [facility], [place] [address], [place], [place] [address]   Aerobic/Anaerobic Culture w Gram Stain (surgical/deep wound)     Status: None (Preliminary result)   Collection Time: 01/04/21  6:48 PM   Specimen: PATH Other; Tissue  Result Value Ref Range Status   Specimen Description ABSCESS  Final   Special Requests EPIDURAL PT ON ANCEF  Final   Gram Stain   Final    NO WBC SEEN NO ORGANISMS SEEN Performed at [facility], [address], [place], [place] [address]    Culture   Final    RARE GRAM NEGATIVE RODS NO ANAEROBES ISOLATED; CULTURE IN PROGRESS FOR 5 DAYS    Report Status PENDING  Incomplete    RADIOLOGY STUDIES/RESULTS: DG Lumbar Spine 2-3 Views  Result Date: 01/04/2021 CLINICAL DATA:  Intraoperative evaluation, laminectomy infusion EXAM: LUMBAR SPINE - 2-3 VIEW COMPARISON:  01/04/2021 FINDINGS: Four fluoroscopic images are obtained during the performance of the procedure and are provided for interpretation  only. Images demonstrate intrapedicular screws within for contiguous levels of the lumbar spine. Evaluation is limited due to technique and body habitus. Please refer to operative report. FLUOROSCOPY TIME:  2 minutes and 7 seconds IMPRESSION: 1. Limited intraoperative evaluation during multilevel lumbar levin ectomy infusion. Electronically Signed   By: Randa Ngo M.D.   On: 01/04/2021 23:30   DG Chest Port 1V same Day  Result Date: 01/06/2021 CLINICAL DATA:  Shortness of breath EXAM: PORTABLE CHEST 1 VIEW COMPARISON:  12/21/2020 FINDINGS: Transverse diameter  of heart is increased. There are no signs of alveolar pulmonary edema. Increased interstitial markings are seen in the left lower lung fields. There is no focal consolidation. There is no pleural effusion or pneumothorax. Pacemaker battery is seen in the left infraclavicular region. There is previous right shoulder arthroplasty. IMPRESSION: Increased interstitial markings are seen in the left lower lung fields suggesting interstitial pneumonitis. Less likely possibility would be asymmetric pulmonary edema. Electronically Signed   By: Elmer Picker M.D.   On: 01/06/2021 11:13   DG C-Arm 1-60 Min-No Report  Result Date: 01/04/2021 Fluoroscopy was utilized by the requesting physician.  No radiographic interpretation.   DG C-Arm 1-60 Min-No Report  Result Date: 01/04/2021 Fluoroscopy was utilized by the requesting physician.  No radiographic interpretation.     LOS: 19 days   Oren Binet, MD  [facility]    To contact the attending provider between 7A-7P or the covering provider during after hours 7P-7A, please log into the web site [email or web address] and access using universal [place] password for that web site. If you do not have the password, please call the hospital operator.  01/06/2021, 2:11 PM

## 2021-01-06 NOTE — Progress Notes (Signed)
[facility] for Infectious Disease  Date of Admission:  12/18/2020    Principal Problem:   E coli bacteremia Active Problems:   AKI (acute kidney injury) ([facility])   Type 2 diabetes mellitus with hypoglycemia without coma (HCC)   Hypothyroidism   Severe sepsis (HCC)   Acute metabolic encephalopathy   Acute hyponatremia   Thrombocytopenia (HCC)   Chronic anemia   Chronic diastolic CHF (congestive heart failure) (HCC)   Discitis of lumbar region   Sepsis with acute renal failure without septic shock (HCC)   Wheeze   Pancytopenia (HCC)          Assessment: 75 year old female with multiple medical problems including diabetes, heart failure requiring pacemaker presented with back pain and fever and was admitted for acute renal failure.  Found to have E. coli bacteremia.    - CT abdomen pelvis showed L4-S1 infection with gas suspicion for osteomyelitis.  Enteric mention radiology did not feel comfortable aspirating.  TEE was not done.  - ID was engaged and recommended cefazolin x6 weeks followed by oral antibiotics as an vertebral infection was suspected to be secondary to E. coli.  -Infectious diseases reengaged this patient taken to the OR with neurosurgery for laminectomy and epidural abscess evacuation due to concern of worsening pain and cauda equina syndrome.  #E. coli bacteremia in the setting of epidural abscess status post evacuation and laminectomy with cultures pending on 01/04/2021 #L4-L5 osteomyelitis Recommendations: -Restarted the clock on cefazolin and treat with 8 weeks weeks of antibiotics from OR(end date 03/01/2021) -Follow or cultures, tailor antibiotics accordingly if needed.  #Fever #Hallucinations -Yesterday pt was eating in bed. Suspect this was an aspirational event. CXR was suggested left interstitial pneumonitis. Would not later antibiotics. -blood and urine Cx pending   Microbiology:   Antibiotics: cefazolin Cultures: OR Cx from 12/12     SUBJECTIVE: T max 103.1 overnight. Pt is stating she is scared. Unable to provide any other hisotry  Review of Systems: Review of Systems  All other systems reviewed and are negative.   Scheduled Meds:  sodium chloride   Intravenous Once   acetaminophen  1,000 mg Oral Q8H   arformoterol  15 mcg Nebulization BID   calcium carbonate  800 mg of elemental calcium Oral BID   calcium-vitamin D  0.5 tablet Oral BID   Chlorhexidine Gluconate Cloth  6 each Topical Daily   fluticasone  1 spray Each Nare Daily   gabapentin  300 mg Oral BID   insulin aspart  0-15 Units Subcutaneous Q4H   levothyroxine  150 mcg Oral Q0600   lidocaine  1 patch Transdermal Daily   loratadine  10 mg Oral Daily   mouth rinse  15 mL Mouth Rinse BID   mupirocin ointment  1 application Nasal BID   nystatin   Topical BID   pantoprazole  40 mg Oral Daily   polyethylene glycol  17 g Oral Daily   pramipexole  1.5 mg Oral TID   QUEtiapine  25 mg Oral QHS   revefenacin  175 mcg Nebulization Daily   sodium chloride flush  10-40 mL Intracatheter Q12H   sodium chloride  1 g Oral BID WC   Continuous Infusions:  sodium chloride Stopped (12/21/20 1240)   sodium chloride      ceFAZolin (ANCEF) IV 2 g (01/06/21 1723)   PRN Meds:.sodium chloride, benzonatate, dextromethorphan, docusate sodium, guaiFENesin, haloperidol lactate, HYDROmorphone (DILAUDID) injection, ipratropium-albuterol, lip balm, methocarbamol, naLOXone (NARCAN)  injection, oxyCODONE, phenol, sodium chloride flush Allergies  Allergen Reactions   Pentosan Polysulfate Sodium     Other reaction(s): Other (See Comments) ELMIRON Y Drug hair fell out 10/21/2009 12:00:00 AM by Johny Blamer CNA 1   Elmiron [Pentosan Polysulfate]    Naloxone Other (See Comments)    Hallucinations Confusion Nightmares    OBJECTIVE: Vitals:   01/06/21 1151 01/06/21 1551 01/06/21 1943 01/06/21 2012  BP: (!) 143/54 (!) 112/40 (!) 109/48   Pulse: 93 85 85   Resp: 19  18    Temp: 99.2 F (37.3 C) 98.4 F (36.9 C) 97.7 F (36.5 C)   TempSrc: Oral Oral Oral   SpO2:    91%  Weight:      Height:       Body mass index is 39.61 kg/m.  Physical Exam Constitutional:      Appearance: Normal appearance.  HENT:     Head: Normocephalic and atraumatic.     Right Ear: Tympanic membrane normal.     Left Ear: Tympanic membrane normal.     Nose: Nose normal.     Mouth/Throat:     Mouth: Mucous membranes are moist.  Eyes:     Extraocular Movements: Extraocular movements intact.     Conjunctiva/sclera: Conjunctivae normal.     Pupils: Pupils are equal, round, and reactive to light.  Cardiovascular:     Rate and Rhythm: Normal rate and regular rhythm.     Heart sounds: No murmur heard.   No friction rub. No gallop.  Pulmonary:     Effort: Pulmonary effort is normal.     Breath sounds: Normal breath sounds.  Abdominal:     General: Abdomen is flat.     Palpations: Abdomen is soft.  Musculoskeletal:        General: Normal range of motion.  Neurological:     Mental Status: She is alert and oriented to person, place, and time.  Psychiatric:        Mood and Affect: Mood normal.      Lab Results Lab Results  Component Value Date   WBC 5.7 01/06/2021   HGB 8.0 (L) 01/06/2021   HCT 24.1 (L) 01/06/2021   MCV 88.3 01/06/2021   PLT 190 01/06/2021    Lab Results  Component Value Date   CREATININE 0.80 01/06/2021   BUN 9 01/06/2021   NA 129 (L) 01/06/2021   K 3.8 01/06/2021   CL 93 (L) 01/06/2021   CO2 28 01/06/2021    Lab Results  Component Value Date   ALT <5 01/06/2021   AST 22 01/06/2021   GGT 109 (H) 11/23/2020   ALKPHOS 147 (H) 01/06/2021   BILITOT 2.6 (H) 01/06/2021        Danelle Earthly, MD [facility] 01/06/2021, 11:23 PM

## 2021-01-06 NOTE — Progress Notes (Signed)
[facility] NEPHROLOGY PROGRESS NOTE  Assessment/ Plan: Pt is a 75 y.o. yo female  with a PMH of DM2, hypothryoidism, HFpEF, sinus pauses s/p PPM here with septick shock 2/2 E. Coli bacteremia along with vertebral osteomyelitis L4-S1 Nephrology was c/s due to patients hypokalemia and AKI.    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  The acute worsening in her sodium is perhaps due to hypervolemia in the setting of fluid resuscitation 2/2 sepsis. Patient was noted to have peripheral edema and worsened sodium level with IV fluid 12/11.  TSH elevated but improved from prior. AM cortisol decreased to 4.1 microg/dL this is not diagnostic for adrenal insufficiency. ACTH stim test negative   Patient's sodium continues to improve s/p lasix and fluid restriction. Urine studies c/w Indicative of SIADH and decreased EABV.  -Continue fluid restriction -Continue salt tablets until patient's oral intake improves.   #Acute kidney injury, nonoliguric: AKI likely hemodynamically mediated with decreased intravascular volume/third spacing concomitant with prolonged hospitalization with complicated course including septic shock/UTI.  CT scan ruled out hydronephrosis. Now resolved.   #Encephalopathy: With long hospitalization and significant back pain, likely ongoing delirium.  Do not think hyponatremia is contributing.  Patient was alert but confused on exam this AM.    #Metabolic acidosis: Resolved after starting oral sodium bicarbonate. Oral bicarb discontinued 12/12    #Hypokalemia: Potassium level improved. Likely decreased in the setting of IV lasix. Continue to replete   Given patient's hyponatremia is much improved and her AKI has resolved. We will sign off, please reach out to [place] if there are any further questions.    Subjective:   Patient with persistent back  pain.   Objective Vital signs in last 24 hours: Vitals:   01/06/21 0854 01/06/21 0954 01/06/21 1059 01/06/21 1151  BP: 107/71 (!) 85/64 (!) 97/53 (!) 143/54  Pulse: (!) 116 (!) 109  93  Resp: 20 20 18 19   Temp: (!) 101.2 F (38.4 C) 99.8 F (37.7 C) 99.7 F (37.6 C) 99.2 F (37.3 C)  TempSrc: Oral Oral Oral Oral  SpO2: 93%     Weight:      Height:       Weight change:   Intake/Output Summary (Last 24 hours) at 01/06/2021 1309 Last data filed at 01/06/2021 0520 Gross per 24 hour  Intake 1096 ml  Output 2350 ml  Net -1254 ml      Labs: Basic Metabolic Panel: Recent Labs  Lab 01/05/21 0453 01/05/21 1648 01/06/21 0554  NA 128* 131* 129*  K 3.2* 3.0* 3.8  CL 94* 95* 93*  CO2 23 27 28   GLUCOSE 132* 143* 135*  BUN 16 12 9   CREATININE 1.05* 0.88 0.80  CALCIUM 7.3* 7.5* 7.3*  PHOS 2.8 2.4* 2.9    Liver Function Tests: Recent Labs  Lab 01/05/21 1648 01/06/21 0554 01/06/21 0951  AST  --   --  22  ALT  --   --  <5  ALKPHOS  --   --  147*  BILITOT  --   --  2.6*  PROT  --   --  6.2*  ALBUMIN 2.6* 2.6* 2.7*    No results for input(s): LIPASE, AMYLASE in the last 168 hours. Recent Labs  Lab 01/03/21 0706 01/06/21 0951  AMMONIA 21 22    CBC: Recent Labs  Lab 01/03/21 0706 01/04/21 0508 01/04/21 2044 01/05/21 0453  01/05/21 1648 01/06/21 0554  WBC 9.8 6.1  --  6.1 5.3 5.7  HGB 7.8* 7.3*   < > 7.1* 6.5* 8.0*  HCT 23.6* 22.3*   < > 20.7* 18.7* 24.1*  MCV 87.4 88.5  --  87.0 87.4 88.3  PLT 294 242  --  201 181 190   < > = values in this interval not displayed.    Cardiac Enzymes: No results for input(s): CKTOTAL, CKMB, CKMBINDEX, TROPONINI in the last 168 hours. CBG: Recent Labs  Lab 01/05/21 2000 01/06/21 0012 01/06/21 0425 01/06/21 0806 01/06/21 1231  GLUCAP 122* 130* 130* 133* 134*     Iron Studies: No results for input(s): IRON, TIBC, TRANSFERRIN, FERRITIN in the last 72 hours. Studies/Results: DG Lumbar Spine 2-3 Views  Result  Date: 01/04/2021 CLINICAL DATA:  Intraoperative evaluation, laminectomy infusion EXAM: LUMBAR SPINE - 2-3 VIEW COMPARISON:  01/04/2021 FINDINGS: Four fluoroscopic images are obtained during the performance of the procedure and are provided for interpretation only. Images demonstrate intrapedicular screws within for contiguous levels of the lumbar spine. Evaluation is limited due to technique and body habitus. Please refer to operative report. FLUOROSCOPY TIME:  2 minutes and 7 seconds IMPRESSION: 1. Limited intraoperative evaluation during multilevel lumbar levin ectomy infusion. Electronically Signed   By: Sharlet Salina M.D.   On: 01/04/2021 23:30   DG Chest Port 1V same Day  Result Date: 01/06/2021 CLINICAL DATA:  Shortness of breath EXAM: PORTABLE CHEST 1 VIEW COMPARISON:  12/21/2020 FINDINGS: Transverse diameter of heart is increased. There are no signs of alveolar pulmonary edema. Increased interstitial markings are seen in the left lower lung fields. There is no focal consolidation. There is no pleural effusion or pneumothorax. Pacemaker battery is seen in the left infraclavicular region. There is previous right shoulder arthroplasty. IMPRESSION: Increased interstitial markings are seen in the left lower lung fields suggesting interstitial pneumonitis. Less likely possibility would be asymmetric pulmonary edema. Electronically Signed   By: Ernie Avena M.D.   On: 01/06/2021 11:13   DG C-Arm 1-60 Min-No Report  Result Date: 01/04/2021 Fluoroscopy was utilized by the requesting physician.  No radiographic interpretation.   DG C-Arm 1-60 Min-No Report  Result Date: 01/04/2021 Fluoroscopy was utilized by the requesting physician.  No radiographic interpretation.    Medications: Infusions:  sodium chloride Stopped (12/21/20 1240)   sodium chloride      ceFAZolin (ANCEF) IV 2 g (01/06/21 0830)    Scheduled Medications:  sodium chloride   Intravenous Once   acetaminophen  1,000 mg  Oral Q8H   arformoterol  15 mcg Nebulization BID   calcium carbonate  800 mg of elemental calcium Oral BID   calcium-vitamin D  0.5 tablet Oral BID   Chlorhexidine Gluconate Cloth  6 each Topical Daily   fluticasone  1 spray Each Nare Daily   gabapentin  300 mg Oral BID   insulin aspart  0-15 Units Subcutaneous Q4H   levothyroxine  150 mcg Oral Q0600   lidocaine  1 patch Transdermal Daily   loratadine  10 mg Oral Daily   mouth rinse  15 mL Mouth Rinse BID   mupirocin ointment  1 application Nasal BID   nystatin   Topical BID   pantoprazole  40 mg Oral Daily   polyethylene glycol  17 g Oral Daily   pramipexole  1.5 mg Oral TID   revefenacin  175 mcg Nebulization Daily   sodium chloride flush  10-40 mL Intracatheter Q12H   sodium chloride  1 g Oral BID WC    have reviewed scheduled and prn medications.  Physical Exam: General:Uncomfortable, alert sitting up in bed Heart:Tachycardic  Lungs:clear to auscultation bilaterally b/l, no crackles Abdomen:soft, obese, Non-tender, non-distended, lower abdominal folds with erythema, no pus  Extremities:1+ pitting edema of BLE up to the knee  Dialysis Access: None  Rick Duff, MD Internal Medicine Resident PGY-2 01/06/2021,1:09 PM  LOS: 19 days

## 2021-01-06 NOTE — Progress Notes (Deleted)
Spoke to lab regarding 0530 lab needs for Cortrosyn injection. Medication administration will need to be pushed to 1st shift to assure lab draws are not falling at shift change and done adequately.  Lab personnel noted in system as well.

## 2021-01-06 NOTE — Progress Notes (Signed)
PHARMACY CONSULT NOTE FOR:  OUTPATIENT  PARENTERAL ANTIBIOTIC THERAPY (OPAT)  Indication: E.coli epidural abscess Regimen: Cefazolin 2g IV every 8 hours End date: 03/01/21  IV antibiotic discharge orders are pended. To discharging provider:  please sign these orders via discharge navigator,  Select New Orders & click on the button choice - Manage This Unsigned Work.     Thank you for allowing pharmacy to be a part of this patients care.  Georgina Pillion, PharmD, BCPS Clinical Pharmacist 01/06/2021 2:51 PM   **Pharmacist phone directory can now be found on [email or web address] ([email or web address]).  Listed under [facility].

## 2021-01-06 NOTE — Progress Notes (Signed)
°   01/06/21 0854  Assess: MEWS Score  Temp (!) 101.2 F (38.4 C)  BP 107/71  Pulse Rate (!) 116  ECG Heart Rate (!) 114  Resp 20  SpO2 93 %  Assess: MEWS Score  MEWS Temp 1  MEWS Systolic 0  MEWS Pulse 2  MEWS RR 0  MEWS LOC 0  MEWS Score 3  MEWS Score Color Yellow  Assess: if the MEWS score is Yellow or Red  Were vital signs taken at a resting state? Yes  Focused Assessment Change from prior assessment (see assessment flowsheet)  Early Detection of Sepsis Score *See Row Information* Medium  MEWS guidelines implemented *See Row Information* Yes  Treat  Pain Scale 0-10  Pain Score 0  Take Vital Signs  Increase Vital Sign Frequency  Yellow: Q 2hr X 2 then Q 4hr X 2, if remains yellow, continue Q 4hrs  Escalate  MEWS: Escalate Yellow: discuss with charge nurse/RN and consider discussing with provider and RRT  Notify: Charge Nurse/RN  Name of Charge Nurse/RN Notified Sarah, RN  Date Charge Nurse/RN Notified 01/06/21  Time Charge Nurse/RN Notified 0854  Notify: Provider  Provider Name/Title Hospitalist  Date Provider Notified 01/06/21  Time Provider Notified [phone]  Document  Patient Outcome Stabilized after interventions

## 2021-01-07 ENCOUNTER — Inpatient Hospital Stay: Payer: Self-pay

## 2021-01-07 LAB — CBC
HCT: 28.1 % — ABNORMAL LOW (ref 36.0–46.0)
Hemoglobin: 9.1 g/dL — ABNORMAL LOW (ref 12.0–15.0)
MCH: 29.3 pg (ref 26.0–34.0)
MCHC: 32.4 g/dL (ref 30.0–36.0)
MCV: 90.4 fL (ref 80.0–100.0)
Platelets: 187 10*3/uL (ref 150–400)
RBC: 3.11 MIL/uL — ABNORMAL LOW (ref 3.87–5.11)
RDW: 16.8 % — ABNORMAL HIGH (ref 11.5–15.5)
WBC: 5 10*3/uL (ref 4.0–10.5)
nRBC: 0 % (ref 0.0–0.2)

## 2021-01-07 LAB — GLUCOSE, CAPILLARY
Glucose-Capillary: 122 mg/dL — ABNORMAL HIGH (ref 70–99)
Glucose-Capillary: 131 mg/dL — ABNORMAL HIGH (ref 70–99)
Glucose-Capillary: 151 mg/dL — ABNORMAL HIGH (ref 70–99)
Glucose-Capillary: 163 mg/dL — ABNORMAL HIGH (ref 70–99)
Glucose-Capillary: 177 mg/dL — ABNORMAL HIGH (ref 70–99)
Glucose-Capillary: 69 mg/dL — ABNORMAL LOW (ref 70–99)
Glucose-Capillary: 72 mg/dL (ref 70–99)

## 2021-01-07 LAB — RENAL FUNCTION PANEL
Albumin: 2.4 g/dL — ABNORMAL LOW (ref 3.5–5.0)
Anion gap: 8 (ref 5–15)
BUN: 8 mg/dL (ref 8–23)
CO2: 29 mmol/L (ref 22–32)
Calcium: 7.2 mg/dL — ABNORMAL LOW (ref 8.9–10.3)
Chloride: 94 mmol/L — ABNORMAL LOW (ref 98–111)
Creatinine, Ser: 0.69 mg/dL (ref 0.44–1.00)
GFR, Estimated: >60 mL/min (ref >60)
Glucose, Bld: 123 mg/dL — ABNORMAL HIGH (ref 70–99)
Phosphorus: 3.5 mg/dL (ref 2.5–4.6)
Potassium: 3.2 mmol/L — ABNORMAL LOW (ref 3.5–5.1)
Sodium: 131 mmol/L — ABNORMAL LOW (ref 135–145)

## 2021-01-07 LAB — RESP PANEL BY RT-PCR (FLU A&B, COVID) ARPGX2
Influenza A by PCR: NEGATIVE
Influenza B by PCR: NEGATIVE
SARS Coronavirus 2 by RT PCR: NEGATIVE

## 2021-01-07 MED ORDER — SODIUM CHLORIDE 0.9% FLUSH
10.0000 mL | Freq: Two times a day (BID) | INTRAVENOUS | Status: DC
  Administered 2021-01-08: 10 mL

## 2021-01-07 MED ORDER — FUROSEMIDE 40 MG PO TABS
40.0000 mg | ORAL_TABLET | Freq: Every day | ORAL | Status: DC
  Administered 2021-01-07 – 2021-01-08 (×2): 40 mg via ORAL
  Filled 2021-01-07 (×2): qty 1

## 2021-01-07 MED ORDER — POTASSIUM CHLORIDE CRYS ER 20 MEQ PO TBCR
40.0000 meq | EXTENDED_RELEASE_TABLET | ORAL | Status: AC
  Administered 2021-01-07 (×2): 40 meq via ORAL
  Filled 2021-01-07 (×2): qty 2

## 2021-01-07 MED ORDER — SODIUM CHLORIDE 0.9% FLUSH: 10.0000 mL | INTRAVENOUS | Status: DC | PRN

## 2021-01-07 MED ORDER — HEPARIN SODIUM (PORCINE) 5000 UNIT/ML IJ SOLN
5000.0000 [IU] | Freq: Three times a day (TID) | INTRAMUSCULAR | Status: DC
  Administered 2021-01-07 – 2021-01-08 (×5): 5000 [IU] via SUBCUTANEOUS
  Filled 2021-01-07 (×5): qty 1

## 2021-01-07 NOTE — Progress Notes (Signed)
Peripherally Inserted Central Catheter Placement  The IV Nurse has discussed with the patient and/or persons authorized to consent for the patient, the purpose of this procedure and the potential benefits and risks involved with this procedure.  The benefits include less needle sticks, lab draws from the catheter, and the patient may be discharged home with the catheter. Risks include, but not limited to, infection, bleeding, blood clot (thrombus formation), and puncture of an artery; nerve damage and irregular heartbeat and possibility to perform a PICC exchange if needed/ordered by physician.  Alternatives to this procedure were also discussed.  Bard Power PICC patient education guide, fact sheet on infection prevention and patient information card has been provided to patient /or left at bedside.   Consent obtained via telephone with daughter.   PICC Placement Documentation  PICC Single Lumen 01/07/21 Right Brachial 37 cm 0 cm (Active)  Indication for Insertion or Continuance of Line Home intravenous therapies (PICC only) 01/07/21 1500  Exposed Catheter (cm) 0 cm 01/07/21 1500  Site Assessment Clean;Dry;Intact 01/07/21 1500  Line Status Flushed;Saline locked;Blood return noted 01/07/21 1500  Dressing Type Transparent;Securing device 01/07/21 1500  Dressing Status Clean;Dry;Intact 01/07/21 1500  Antimicrobial disc in place? Yes 01/07/21 1500  Safety Lock Not Applicable 01/07/21 1500  Line Care Connections checked and tightened 01/07/21 1500  Dressing Intervention New dressing 01/07/21 1500  Dressing Change Due 01/14/21 01/07/21 1500       Franne Grip Renee 01/07/2021, 4:00 PM

## 2021-01-07 NOTE — Progress Notes (Signed)
[facility] for Infectious Disease  Date of Admission:  12/18/2020    Principal Problem:   E coli bacteremia Active Problems:   AKI (acute kidney injury) ([place])   Type 2 diabetes mellitus with hypoglycemia without coma (HCC)   Hypothyroidism   Severe sepsis (HCC)   Acute metabolic encephalopathy   Acute hyponatremia   Thrombocytopenia (HCC)   Chronic anemia   Chronic diastolic CHF (congestive heart failure) (HCC)   Discitis of lumbar region   Sepsis with acute renal failure without septic shock (HCC)   Wheeze   Pancytopenia (HCC)          Assessment: 75 year old female with multiple medical problems including diabetes, heart failure requiring pacemaker presented with back pain and fever and was admitted for acute renal failure.  Found to have E. coli bacteremia.    - CT abdomen pelvis showed L4-S1 infection with gas suspicion for osteomyelitis.  Enteric mention radiology did not feel comfortable aspirating.  TEE was not done.  - ID was engaged and recommended cefazolin x6 weeks followed by oral antibiotics as an vertebral infection was suspected to be secondary to E. coli.  -Infectious diseases reengaged this patient taken to the OR with neurosurgery for laminectomy and epidural abscess evacuation due to concern of worsening pain and cauda equina syndrome.  #Hx of E. coli bacteremia in the setting of epidural abscess status post evacuation and laminectomy with cultures + Ecoli on 01/04/2021 #L4-L5 osteomyelitis Recommendations: -Restarted the clock on cefazolin and treat with 8 weeks weeks of antibiotics from OR(end date 03/01/2021)   #Fever #Hallucinations -Resolved   OPAT ORDERS:  Diagnosis: epidural abscess and osteomyelitis  Culture Result: Ecoli(OR cx)  Allergies  Allergen Reactions   Pentosan Polysulfate Sodium     Other reaction(s): Other (See Comments) ELMIRON Y Drug hair fell out 10/21/2009 12:00:00 AM by [name] CNA 1   Elmiron  [Pentosan Polysulfate]    Naloxone Other (See Comments)    Hallucinations Confusion Nightmares     Discharge antibiotics to be given via PICC line:  Per pharmacy protocol cefazolin   Duration: 8 weeks End Date: 03/01/21  [facility] Care Per Protocol with Biopatch Use: Home health RN for IV administration and teaching, line care and labs.    Labs weekly while on IV antibiotics: __ CBC with differential __ CMP __ CRP __ ESR __ CK  __ Please pull PIC at completion of IV antibiotics   Fax weekly labs to [phone]  Clinic Follow Up Appt: 01/29/21 at 1:45pm  @ [facility] with Dr.Brianna Esson  Microbiology:   Antibiotics: cefazolin Cultures: OR Cx from 12/12 + Ecoli   SUBJECTIVE: Afebrile overnight. She reports she feels much better today.  Review of Systems: Review of Systems  All other systems reviewed and are negative.   Scheduled Meds:  sodium chloride   Intravenous Once   acetaminophen  1,000 mg Oral Q8H   arformoterol  15 mcg Nebulization BID   calcium carbonate  800 mg of elemental calcium Oral BID   calcium-vitamin D  0.5 tablet Oral BID   Chlorhexidine Gluconate Cloth  6 each Topical Daily   fluticasone  1 spray Each Nare Daily   furosemide  40 mg Oral Daily   gabapentin  300 mg Oral BID   heparin injection (subcutaneous)  5,000 Units Subcutaneous Q8H   insulin aspart  0-15 Units Subcutaneous Q4H   levothyroxine  150 mcg Oral Q0600   lidocaine  1 patch Transdermal  Daily   loratadine  10 mg Oral Daily   mouth rinse  15 mL Mouth Rinse BID   mupirocin ointment  1 application Nasal BID   nystatin   Topical BID   pantoprazole  40 mg Oral Daily   polyethylene glycol  17 g Oral Daily   pramipexole  1.5 mg Oral TID   QUEtiapine  25 mg Oral QHS   revefenacin  175 mcg Nebulization Daily   sodium chloride flush  10-40 mL Intracatheter Q12H   sodium chloride flush  10-40 mL Intracatheter Q12H   sodium chloride  1 g Oral BID WC   Continuous Infusions:  sodium  chloride Stopped (12/21/20 1240)   sodium chloride      ceFAZolin (ANCEF) IV 2 g (01/07/21 1812)   PRN Meds:.sodium chloride, benzonatate, dextromethorphan, docusate sodium, guaiFENesin, haloperidol lactate, HYDROmorphone (DILAUDID) injection, ipratropium-albuterol, lip balm, methocarbamol, naLOXone (NARCAN)  injection, oxyCODONE, phenol, sodium chloride flush, sodium chloride flush Allergies  Allergen Reactions   Pentosan Polysulfate Sodium     Other reaction(s): Other (See Comments) ELMIRON Y Drug hair fell out 10/21/2009 12:00:00 AM by [name] CNA 1   Elmiron [Pentosan Polysulfate]    Naloxone Other (See Comments)    Hallucinations Confusion Nightmares    OBJECTIVE: Vitals:   01/07/21 2000 01/07/21 2002 01/07/21 2100 01/07/21 2124  BP:  (!) 86/55  (!) 118/58  Pulse: 88 89 100 95  Resp: '13 17 15 19  ' Temp:      TempSrc:      SpO2: (!) 87% 92% 94% 92%  Weight:      Height:       Body mass index is 39.31 kg/m.  Physical Exam Constitutional:      Appearance: Normal appearance.  HENT:     Head: Normocephalic and atraumatic.     Right Ear: Tympanic membrane normal.     Left Ear: Tympanic membrane normal.     Nose: Nose normal.     Mouth/Throat:     Mouth: Mucous membranes are moist.  Eyes:     Extraocular Movements: Extraocular movements intact.     Conjunctiva/sclera: Conjunctivae normal.     Pupils: Pupils are equal, round, and reactive to light.  Cardiovascular:     Rate and Rhythm: Normal rate and regular rhythm.     Heart sounds: No murmur heard.   No friction rub. No gallop.  Pulmonary:     Effort: Pulmonary effort is normal.     Breath sounds: Normal breath sounds.  Abdominal:     General: Abdomen is flat.     Palpations: Abdomen is soft.  Musculoskeletal:        General: Normal range of motion.  Skin:    General: Skin is warm and dry.  Neurological:     General: No focal deficit present.     Mental Status: She is alert and oriented to person,  place, and time.  Psychiatric:        Mood and Affect: Mood normal.      Lab Results Lab Results  Component Value Date   WBC 5.0 01/07/2021   HGB 9.1 (L) 01/07/2021   HCT 28.1 (L) 01/07/2021   MCV 90.4 01/07/2021   PLT 187 01/07/2021    Lab Results  Component Value Date   CREATININE 0.69 01/07/2021   BUN 8 01/07/2021   NA 131 (L) 01/07/2021   K 3.2 (L) 01/07/2021   CL 94 (L) 01/07/2021   CO2 29 01/07/2021  Lab Results  Component Value Date   ALT <5 01/06/2021   AST 22 01/06/2021   GGT 109 (H) 11/23/2020   ALKPHOS 147 (H) 01/06/2021   BILITOT 2.6 (H) 01/06/2021        Laurice Record, [facility] for Infectious Disease [facility] 01/07/2021, 10:05 PM

## 2021-01-07 NOTE — Progress Notes (Signed)
Neurosurgery Service Progress Note  Subjective: No acute events overnight, back pain again minimal today, she's very happy again  Objective: Vitals:   01/07/21 0338 01/07/21 0500 01/07/21 0733 01/07/21 0755  BP: (!) 125/55   (!) 119/51  Pulse: 78   77  Resp: 18   19  Temp: 98.3 F (36.8 C)   98.3 F (36.8 C)  TempSrc: Oral   Oral  SpO2: 95%  94%   Weight:  91.3 kg    Height:        Physical Exam: Strength 5/5 x4 except 4+/5 dorsiflexion weakness, incision c/d/i  Assessment & Plan: 75 y.o. woman w/ bacteremia, large epidural abscess, osteomyelitis with pathologic fracture, s/p decompression / epidural abscess evacuation and PSIF.  -pt is doing well and is clinically doing substantially better from a lumbar standpoint, Cx are growing some GNRs which isn't surprising. I'll be out of town starting tomorrow. If anything is needed from a neurosurgical standpoint, please call the neurosurgeon on call, they will be covering for me  Jadene Pierini  01/07/21 9:21 AM

## 2021-01-07 NOTE — Progress Notes (Signed)
Inpatient Diabetes Program Recommendations  AACE/ADA: New Consensus Statement on Inpatient Glycemic Control (2015)  Target Ranges:  Prepandial:   less than 140 mg/dL      Peak postprandial:   less than 180 mg/dL (1-2 hours)      Critically ill patients:  140 - 180 mg/dL   Lab Results  Component Value Date   GLUCAP 163 (H) 01/07/2021   HGBA1C 8.4 (H) 11/23/2020    Noted mild hypoglycemia this AM, consider changing correction to Novolog 0-9 units TID  & HS now that patient has diet.   Thanks, Lujean Rave, MSN, RNC-OB Diabetes Coordinator [phone] (8a-5p)

## 2021-01-07 NOTE — TOC Progression Note (Addendum)
Transition of Care [facility]) - Progression Note    Patient Details  Name: Jillian Leblanc MRN: [id number] Date of Birth: 1945-03-12  [facility] CM/SW Contact  Mearl Latin, LCSW Phone Number: 01/07/2021, 12:06 PM  Clinical Narrative:    CSW left voicemail for [facility] to make sure they still have bed available for patient.   CSW left voicemail for patient's daughter.   [facility] returned call and stated that they do have a bed for patient if family can do paperwork tomorrow at 1pm.   5:26pm-CSW spoke with patient's daughter. She is in agreement to do paperwork at 1pm at [facility].         Barriers to Discharge: Continued Medical Work up  Expected Discharge Plan and Services   In-house Referral: Clinical Social Work   Post Acute Care Choice: Skilled Nursing Facility Living arrangements for the past 2 months: Single Family Home                                       Social Determinants of Health (SDOH) Interventions    Readmission Risk Interventions No flowsheet data found.

## 2021-01-07 NOTE — Progress Notes (Addendum)
PROGRESS NOTE        PATIENT DETAILS Name: Jillian Leblanc Age: 75 y.o. Sex: female Date of Birth: 09-16-1945 Admit Date: 12/18/2020 Admitting Physician Angie Fava, DO ONG:[id number], Idelia Salm, MD  Brief Narrative: [name] a 75 y.o. 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 coli bacteremia-along with vertebral osteomyelitis involving L4-S1.  Initially managed in the ICU-upon stability-transferred to the [facility].  Patient was evaluated by neurosurgery, ID-started on appropriate antimicrobial therapy-neurosurgery felt that patient could be watched with IV antibiotics.  However for the hospital course was complicated by worsening back pain-repeat CT L-spine on 12/12-showed worsening findings-subsequently neurosurgery performed L4-L5 laminectomy and evacuation of epidural abscess.  See below for further details.  Subjective: Much more awake/alert.  Significantly better than yesterday.  Objective: Vitals: Blood pressure (!) 126/51, pulse 84, temperature 98.3 F (36.8 C), temperature source Oral, resp. rate 20, height 5' (1.524 m), weight 91.3 kg, SpO2 98 %.   Exam: Gen Exam:Alert awake-not in any distress HEENT:atraumatic, normocephalic Chest: B/L clear to auscultation anteriorly CVS:S1S2 regular Abdomen:soft non tender, non distended Extremities:no edema Neurology: Moving both lower extremities-able to bend knees-able to lift both legs off the bed. Skin: no rash   Pertinent Labs/Radiology: Recent Labs  Lab 01/06/21 0951 01/07/21 0147  WBC  --  5.0  HGB  --  9.1*  PLT  --  187  NA  --  131*  K  --  3.2*  CREATININE  --  0.69  AST 22  --   ALT <5  --   ALKPHOS 147*  --   BILITOT 2.6*  --       Assessment/Plan: Septic shock due to E. coli  bacteremia-L4-S1 osteomyelitis with epidural abscess: Sepsis physiology only improved.  Initially plans were for nonoperative management and IV antibiotics-however due to worsening pain/lower extremity weakness-patient underwent L4-L5 laminectomy and evacuation of epidural abscess on 12/12.  Was briefly febrile on 12/14-with suspicion for microaspiration given confusion.  She is significantly better today.  Intraoperative cultures also positive for E. coli.  Repeat blood cultures on 12/14 negative so far.  ID recommends IV Ancef-end date of 03/01/2021.  Discussed with ID on 12/15-okay to place PICC line.  Acute metabolic encephalopathy: Due to septic shock/AKI/hospital delirium-mental status had improved-however postoperatively-patient developed delirium-likely due to fever and narcotics.  Managed with supportive care-much more awake/alert compared to the past few days-continue with nightly Seroquel.   Acute hypoxic respiratory failure: Multifactorial etiology-due to asthma exacerbation and pulmonary edema.  Continues to clinically improve-with supportive care and diuretic regimen-on minimal amount of oxygen.  No longer on steroids.  Acute urinary retention: Likely due to pain and epidural abscess-Foley catheter remains in place-noted mental status is improved-we will attempt a voiding trial.  Hyponatremia: Suspect this was due to volume overload-has responded to IV Lasix/salt tablets and IV albumin-appreciate nephrology input.  Continue to maintain fluid restriction-remains on salt tablets-resume oral diuretic regimen.  Hypokalemia: Replete and recheck.  AKI: Mild-felt to be due to intravascular volume depletion-responding well to IV albumin/Lasix combination.  PAF with RVR: Maintaining sinus rhythm-initially required amiodarone-reviewed cardiology note-anticoagulation issue to be addressed upon outpatient follow-up.  Thrombocytopenia: Resolved-likely due to sepsis.  Anemia: Multifactorial  etiology-but due to acute illness-some amount of perioperative blood loss anemia-did require 1 unit of PRBC transfusion on 12/13-CBC currently stable-watch closely for  HFpEF: volume overload is due to hypoalbuminemia/third spacing rather than decompensated heart failure.  Volume status has stabilized following several doses of IV Lasix.  DM-2 (A1c 8.27 October 2020): CBGs stable-no longer on Levemir-continue SSI and follow trend.    Recent Labs    01/07/21 0342 01/07/21 0757 01/07/21 1203  GLUCAP 177* 122* 163*     Hypothyroidism: Continue Synthroid-both TSH/free T4 elevated on recent labs-likely due to sick euthyroid syndrome-recommendations are to repeat thyroid function test in a few weeks.  GERD: Continue PPI  Dysphagia: Tolerating dysphagia 3 diet-I suspect this is from deconditioning.  SLP following.  Left elbow pain: Seems to have improved-CT elbow on 12/10-does not show any collection-shows cellulitis.  Already on IV Ancef.  Dopplers negative for DVT as well.  For now supportive care.   Morbid Obesity Estimated body mass index is 39.31 kg/m as calculated from the following:   Height as of this encounter: 5' (1.524 m).   Weight as of this encounter: 91.3 kg.    Procedures: 12/12>>L4, L5 laminectomies, evacuation of epidural abscess, L3 to S2 / pelvis posterolateral instrumented fusion Consults:PCCM, ID, neurosurgery DVT Prophylaxis: Heparin Code Status:Full code  Family Communication: Daughter-[phone] updated over the phone on 12/15  Time spent: 35 minutes-Greater than 50% of this time was spent in counseling, explanation of diagnosis, planning of further management, and coordination of care.   Disposition Plan: Status is: Inpatient  Remains inpatient appropriate because: Bacteremia-epidural abscess-lumbar osteomyelitis-febrile-hyponatremia-not yet stable for discharge.    Diet: Diet Order             Diet heart healthy/carb modified Room service  appropriate? No; Fluid consistency: Thin; Fluid restriction: 1500 mL Fluid  Diet effective now                     Antimicrobial agents: Anti-infectives (From admission, onward)    Start     Dose/Rate Route Frequency Ordered Stop   12/21/20 1800  ceFAZolin (ANCEF) IVPB 2g/100 mL premix        2 g 200 mL/hr over 30 Minutes Intravenous Every 8 hours 12/21/20 1216 03/01/21 2359   12/20/20 1800  cefTRIAXone (ROCEPHIN) 2 g in sodium chloride 0.9 % 100 mL IVPB  Status:  Discontinued        2 g 200 mL/hr over 30 Minutes Intravenous Every 24 hours 12/20/20 0920 12/21/20 1216   12/20/20 0100  meropenem (MERREM) 1 g in sodium chloride 0.9 % 100 mL IVPB  Status:  Discontinued        1 g 200 mL/hr over 30 Minutes Intravenous Every 8 hours 12/20/20 0004 12/20/20 0920   12/19/20 2000  ceFEPIme (MAXIPIME) 2 g in sodium chloride 0.9 % 100 mL IVPB  Status:  Discontinued        2 g 200 mL/hr over 30 Minutes Intravenous Every 24 hours 12/19/20 0057 12/19/20 1023   12/19/20 2000  cefTRIAXone (ROCEPHIN) 2 g in sodium chloride 0.9 % 100 mL IVPB  Status:  Discontinued        2 g 200 mL/hr over 30 Minutes Intravenous Every 24 hours 12/19/20 1023 12/20/20 0004   12/19/20 0057  vancomycin variable dose per unstable renal function (pharmacist dosing)  Status:  Discontinued         Does not apply See admin instructions 12/19/20 0057 12/19/20 1023   12/18/20  2000  ceFEPIme (MAXIPIME) 2 g in sodium chloride 0.9 % 100 mL IVPB        2 g 200 mL/hr over 30 Minutes Intravenous  Once 12/18/20 1955 12/18/20 2050   12/18/20 2000  metroNIDAZOLE (FLAGYL) IVPB 500 mg        500 mg 100 mL/hr over 60 Minutes Intravenous  Once 12/18/20 1955 12/18/20 2210   12/18/20 2000  vancomycin (VANCOREADY) IVPB 1750 mg/350 mL        1,750 mg 175 mL/hr over 120 Minutes Intravenous  Once 12/18/20 1955 12/19/20 0045        MEDICATIONS: Scheduled Meds:  sodium chloride   Intravenous Once   acetaminophen  1,000 mg Oral Q8H    arformoterol  15 mcg Nebulization BID   calcium carbonate  800 mg of elemental calcium Oral BID   calcium-vitamin D  0.5 tablet Oral BID   Chlorhexidine Gluconate Cloth  6 each Topical Daily   fluticasone  1 spray Each Nare Daily   furosemide  40 mg Oral Daily   gabapentin  300 mg Oral BID   heparin injection (subcutaneous)  5,000 Units Subcutaneous Q8H   insulin aspart  0-15 Units Subcutaneous Q4H   levothyroxine  150 mcg Oral Q0600   lidocaine  1 patch Transdermal Daily   loratadine  10 mg Oral Daily   mouth rinse  15 mL Mouth Rinse BID   mupirocin ointment  1 application Nasal BID   nystatin   Topical BID   pantoprazole  40 mg Oral Daily   polyethylene glycol  17 g Oral Daily   pramipexole  1.5 mg Oral TID   QUEtiapine  25 mg Oral QHS   revefenacin  175 mcg Nebulization Daily   sodium chloride flush  10-40 mL Intracatheter Q12H   sodium chloride  1 g Oral BID WC   Continuous Infusions:  sodium chloride Stopped (12/21/20 1240)   sodium chloride      ceFAZolin (ANCEF) IV 2 g (01/07/21 0855)   PRN Meds:.sodium chloride, benzonatate, dextromethorphan, docusate sodium, guaiFENesin, haloperidol lactate, HYDROmorphone (DILAUDID) injection, ipratropium-albuterol, lip balm, methocarbamol, naLOXone (NARCAN)  injection, oxyCODONE, phenol, sodium chloride flush   I have personally reviewed following labs and imaging studies  LABORATORY DATA: CBC: Recent Labs  Lab 01/04/21 0508 01/04/21 2044 01/05/21 0453 01/05/21 1648 01/06/21 0554 01/07/21 0147  WBC 6.1  --  6.1 5.3 5.7 5.0  HGB 7.3* 8.2* 7.1* 6.5* 8.0* 9.1*  HCT 22.3* 24.0* 20.7* 18.7* 24.1* 28.1*  MCV 88.5  --  87.0 87.4 88.3 90.4  PLT 242  --  201 181 190 187     Basic Metabolic Panel: Recent Labs  Lab 01/04/21 1644 01/04/21 2044 01/05/21 0453 01/05/21 1648 01/06/21 0554 01/07/21 0147  NA 127* 129* 128* 131* 129* 131*  K 3.1* 3.4* 3.2* 3.0* 3.8 3.2*  CL 91* 88* 94* 95* 93* 94*  CO2 27  --  23 27 28 29    GLUCOSE 91 110* 132* 143* 135* 123*  BUN 19 18 16 12 9 8   CREATININE 1.10* 0.90 1.05* 0.88 0.80 0.69  CALCIUM 8.0*  --  7.3* 7.5* 7.3* 7.2*  PHOS 4.1  --  2.8 2.4* 2.9 3.5     GFR: Estimated Creatinine Clearance: 63.1 mL/min (by C-G formula based on SCr of 0.69 mg/dL).  Liver Function Tests: Recent Labs  Lab 01/05/21 0453 01/05/21 1648 01/06/21 0554 01/06/21 0951 01/07/21 0147  AST  --   --   --  22  --  ALT  --   --   --  <5  --   ALKPHOS  --   --   --  147*  --   BILITOT  --   --   --  2.6*  --   PROT  --   --   --  6.2*  --   ALBUMIN 2.3* 2.6* 2.6* 2.7* 2.4*    No results for input(s): LIPASE, AMYLASE in the last 168 hours. Recent Labs  Lab 01/03/21 0706 01/06/21 0951  AMMONIA 21 22     Coagulation Profile: No results for input(s): INR, PROTIME in the last 168 hours.  Cardiac Enzymes: No results for input(s): CKTOTAL, CKMB, CKMBINDEX, TROPONINI in the last 168 hours.  BNP (last 3 results) No results for input(s): PROBNP in the last 8760 hours.  Lipid Profile: No results for input(s): CHOL, HDL, LDLCALC, TRIG, CHOLHDL, LDLDIRECT in the last 72 hours.  Thyroid Function Tests: No results for input(s): TSH, T4TOTAL, FREET4, T3FREE, THYROIDAB in the last 72 hours.  Anemia Panel: Recent Labs    01/06/21 0951  VITAMINB12 333     Urine analysis:    Component Value Date/Time   COLORURINE YELLOW 01/06/2021 0803   APPEARANCEUR HAZY (A) 01/06/2021 0803   LABSPEC 1.015 01/06/2021 0803   PHURINE 7.5 01/06/2021 0803   GLUCOSEU 100 (A) 01/06/2021 0803   HGBUR MODERATE (A) 01/06/2021 0803   BILIRUBINUR SMALL (A) 01/06/2021 0803   KETONESUR 15 (A) 01/06/2021 0803   PROTEINUR 100 (A) 01/06/2021 0803   UROBILINOGEN 0.2 09/15/2012 1536   NITRITE POSITIVE (A) 01/06/2021 0803   LEUKOCYTESUR MODERATE (A) 01/06/2021 0803    Sepsis Labs: Lactic Acid, Venous    Component Value Date/Time   LATICACIDVEN 1.5 12/20/2020 0429    MICROBIOLOGY: Recent Results  (from the past 240 hour(s))  Resp Panel by RT-PCR (Flu A&B, Covid) Nasopharyngeal Swab     Status: None   Collection Time: 12/29/20  3:29 PM   Specimen: Nasopharyngeal Swab; Nasopharyngeal(NP) swabs in vial transport medium  Result Value Ref Range Status   SARS Coronavirus 2 by RT PCR NEGATIVE NEGATIVE Final    Comment: (NOTE) SARS-CoV-2 target nucleic acids are NOT DETECTED.  The SARS-CoV-2 RNA is generally detectable in upper respiratory specimens during the acute phase of infection. The lowest concentration of SARS-CoV-2 viral copies this assay can detect is 138 copies/mL. A negative result does not preclude SARS-Cov-2 infection and should not be used as the sole basis for treatment or other patient management decisions. A negative result may occur with  improper specimen collection/handling, submission of specimen other than nasopharyngeal swab, presence of viral mutation(s) within the areas targeted by this assay, and inadequate number of viral copies(<138 copies/mL). A negative result must be combined with clinical observations, patient history, and epidemiological information. The expected result is Negative.  Fact Sheet for Patients:  BloggerCourse.com  Fact Sheet for Healthcare Providers:  SeriousBroker.it  This test is no t yet approved or cleared by the [place] FDA and  has been authorized for detection and/or diagnosis of SARS-CoV-2 by FDA under an Emergency Use Authorization (EUA). This EUA will remain  in effect (meaning this test can be used) for the duration of the COVID-19 declaration under Section 564(b)(1) of the Act, 21 U.S.C.section 360bbb-3(b)(1), unless the authorization is terminated  or revoked sooner.       Influenza A by PCR NEGATIVE NEGATIVE Final   Influenza B by PCR NEGATIVE NEGATIVE Final    Comment: (NOTE)  The Xpert Xpress SARS-CoV-2/FLU/RSV plus assay is intended as an aid in the  diagnosis of influenza from Nasopharyngeal swab specimens and should not be used as a sole basis for treatment. Nasal washings and aspirates are unacceptable for Xpert Xpress SARS-CoV-2/FLU/RSV testing.  Fact Sheet for Patients: BloggerCourse.com  Fact Sheet for Healthcare Providers: SeriousBroker.it  This test is not yet approved or cleared by the [place] FDA and has been authorized for detection and/or diagnosis of SARS-CoV-2 by FDA under an Emergency Use Authorization (EUA). This EUA will remain in effect (meaning this test can be used) for the duration of the COVID-19 declaration under Section 564(b)(1) of the Act, 21 U.S.C. section 360bbb-3(b)(1), unless the authorization is terminated or revoked.  Performed at [facility], [address] [place], [place] [address]   Culture, blood (routine x 2)     Status: None   Collection Time: 12/30/20 11:33 AM   Specimen: BLOOD RIGHT HAND  Result Value Ref Range Status   Specimen Description BLOOD RIGHT HAND  Final   Special Requests   Final    BOTTLES DRAWN AEROBIC AND ANAEROBIC Blood Culture adequate volume   Culture   Final    NO GROWTH 5 DAYS Performed at [facility], [address], [place], [place] [address]    Report Status 01/04/2021 FINAL  Final  Culture, blood (routine x 2)     Status: None   Collection Time: 12/30/20 11:43 AM   Specimen: BLOOD RIGHT HAND  Result Value Ref Range Status   Specimen Description BLOOD RIGHT HAND  Final   Special Requests   Final    BOTTLES DRAWN AEROBIC AND ANAEROBIC Blood Culture adequate volume   Culture   Final    NO GROWTH 5 DAYS Performed at [facility], [address], [place], [place] [address]    Report Status 01/04/2021 FINAL  Final  Surgical pcr screen     Status: Abnormal   Collection Time: 01/04/21  1:07 PM   Specimen: Nasal Mucosa; Nasal Swab  Result Value Ref Range Status   MRSA, PCR POSITIVE  (A) NEGATIVE Final    Comment: RESULT CALLED TO, READ BACK BY AND VERIFIED WITH: RM C GANOE [id number] AT 1443 BY CM    Staphylococcus aureus POSITIVE (A) NEGATIVE Final    Comment: (NOTE) The Xpert SA Assay (FDA approved for NASAL specimens in patients 30 years of age and older), is one component of a comprehensive surveillance program. It is not intended to diagnose infection nor to guide or monitor treatment. Performed at [facility], [address], [place], [place] [address]   Aerobic/Anaerobic Culture w Gram Stain (surgical/deep wound)     Status: None (Preliminary result)   Collection Time: 01/04/21  6:48 PM   Specimen: PATH Other; Tissue  Result Value Ref Range Status   Specimen Description ABSCESS  Final   Special Requests EPIDURAL PT ON ANCEF  Final   Gram Stain   Final    NO WBC SEEN NO ORGANISMS SEEN Performed at [facility], [address], [place], [place] [address]    Culture   Final    RARE ESCHERICHIA COLI NO ANAEROBES ISOLATED; CULTURE IN PROGRESS FOR 5 DAYS    Report Status PENDING  Incomplete   Organism ID, Bacteria ESCHERICHIA COLI  Final      Susceptibility   Escherichia coli - MIC*    AMPICILLIN 8 SENSITIVE Sensitive     CEFAZOLIN <=4  SENSITIVE Sensitive     CEFEPIME <=0.12 SENSITIVE Sensitive     CEFTAZIDIME <=1 SENSITIVE Sensitive     CEFTRIAXONE <=0.25 SENSITIVE Sensitive     CIPROFLOXACIN <=0.25 SENSITIVE Sensitive     GENTAMICIN <=1 SENSITIVE Sensitive     IMIPENEM <=0.25 SENSITIVE Sensitive     TRIMETH/SULFA <=20 SENSITIVE Sensitive     AMPICILLIN/SULBACTAM <=2 SENSITIVE Sensitive     PIP/TAZO <=4 SENSITIVE Sensitive     * RARE ESCHERICHIA COLI  Culture, blood (routine x 2)     Status: None (Preliminary result)   Collection Time: 01/06/21  8:46 AM   Specimen: BLOOD LEFT HAND  Result Value Ref Range Status   Specimen Description BLOOD LEFT HAND  Final   Special Requests   Final    BOTTLES DRAWN AEROBIC ONLY Blood Culture adequate  volume   Culture   Final    NO GROWTH < 24 HOURS Performed at [facility], [address] [place], [place] [address]    Report Status PENDING  Incomplete  Culture, blood (routine x 2)     Status: None (Preliminary result)   Collection Time: 01/06/21  8:47 AM   Specimen: BLOOD RIGHT HAND  Result Value Ref Range Status   Specimen Description BLOOD RIGHT HAND  Final   Special Requests   Final    BOTTLES DRAWN AEROBIC ONLY Blood Culture adequate volume   Culture   Final    NO GROWTH < 24 HOURS Performed at [facility], [address] [place], [place] [address]    Report Status PENDING  Incomplete  Urine Culture     Status: Abnormal (Preliminary result)   Collection Time: 01/06/21  2:24 PM   Specimen: Urine, Catheterized  Result Value Ref Range Status   Specimen Description URINE, CATHETERIZED  Final   Special Requests NONE  Final   Culture (A)  Final    >=100,000 COLONIES/mL GRAM NEGATIVE RODS SUSCEPTIBILITIES TO FOLLOW Performed at [facility], [address] [place], [place] [address]    Report Status PENDING  Incomplete    RADIOLOGY STUDIES/RESULTS: DG Chest Port 1V same Day  Result Date: 01/06/2021 CLINICAL DATA:  Shortness of breath EXAM: PORTABLE CHEST 1 VIEW COMPARISON:  12/21/2020 FINDINGS: Transverse diameter of heart is increased. There are no signs of alveolar pulmonary edema. Increased interstitial markings are seen in the left lower lung fields. There is no focal consolidation. There is no pleural effusion or pneumothorax. Pacemaker battery is seen in the left infraclavicular region. There is previous right shoulder arthroplasty. IMPRESSION: Increased interstitial markings are seen in the left lower lung fields suggesting interstitial pneumonitis. Less likely possibility would be asymmetric pulmonary edema. Electronically Signed   By: Ernie Avena M.D.   On: 01/06/2021 11:13     LOS: 20 days   Jeoffrey Massed, MD  [facility]    To contact the attending provider between 7A-7P or the covering provider during after hours 7P-7A, please log into the web site [email or web address] and access using universal [place] password for that web site. If you do not have the password, please call the hospital operator.  01/07/2021, 12:59 PM

## 2021-01-08 LAB — BASIC METABOLIC PANEL
Anion gap: 7 (ref 5–15)
BUN: 6 mg/dL — ABNORMAL LOW (ref 8–23)
CO2: 27 mmol/L (ref 22–32)
Calcium: 7.1 mg/dL — ABNORMAL LOW (ref 8.9–10.3)
Chloride: 96 mmol/L — ABNORMAL LOW (ref 98–111)
Creatinine, Ser: 0.75 mg/dL (ref 0.44–1.00)
GFR, Estimated: >60 mL/min (ref >60)
Glucose, Bld: 91 mg/dL (ref 70–99)
Potassium: 3.6 mmol/L (ref 3.5–5.1)
Sodium: 130 mmol/L — ABNORMAL LOW (ref 135–145)

## 2021-01-08 LAB — GLUCOSE, CAPILLARY
Glucose-Capillary: 104 mg/dL — ABNORMAL HIGH (ref 70–99)
Glucose-Capillary: 116 mg/dL — ABNORMAL HIGH (ref 70–99)
Glucose-Capillary: 124 mg/dL — ABNORMAL HIGH (ref 70–99)
Glucose-Capillary: 134 mg/dL — ABNORMAL HIGH (ref 70–99)
Glucose-Capillary: 72 mg/dL (ref 70–99)
Glucose-Capillary: 79 mg/dL (ref 70–99)

## 2021-01-08 LAB — URINE CULTURE: Culture: >=100000 — AB

## 2021-01-08 MED ORDER — ACETAMINOPHEN 500 MG PO TABS: 1000.0000 mg | ORAL_TABLET | Freq: Three times a day (TID) | ORAL | 0 refills | Status: AC | PRN

## 2021-01-08 MED ORDER — IPRATROPIUM-ALBUTEROL 0.5-2.5 (3) MG/3ML IN SOLN: 3.0000 mL | Freq: Three times a day (TID) | RESPIRATORY_TRACT | Status: AC

## 2021-01-08 MED ORDER — FUROSEMIDE 40 MG PO TABS: 40.0000 mg | ORAL_TABLET | Freq: Every day | ORAL | Status: AC

## 2021-01-08 MED ORDER — LIDOCAINE 5 % EX PTCH: 1.0000 | MEDICATED_PATCH | Freq: Every day | CUTANEOUS | 0 refills | Status: AC

## 2021-01-08 MED ORDER — POLYETHYLENE GLYCOL 3350 17 G PO PACK: 17.0000 g | PACK | Freq: Every day | ORAL | 0 refills | Status: AC | PRN

## 2021-01-08 MED ORDER — SODIUM CHLORIDE 1 G PO TABS: 1.0000 g | ORAL_TABLET | Freq: Two times a day (BID) | ORAL | Status: AC

## 2021-01-08 MED ORDER — PANTOPRAZOLE SODIUM 40 MG PO TBEC: 40.0000 mg | DELAYED_RELEASE_TABLET | Freq: Every day | ORAL | Status: DC

## 2021-01-08 MED ORDER — INSULIN ASPART 100 UNIT/ML IJ SOLN
0.0000 [IU] | Freq: Three times a day (TID) | INTRAMUSCULAR | Status: DC
Start: 2021-01-08 — End: 2021-01-09
  Administered 2021-01-08 (×2): 1 [IU] via SUBCUTANEOUS

## 2021-01-08 MED ORDER — CEFAZOLIN IV (FOR PTA / DISCHARGE USE ONLY): 2.0000 g | Freq: Three times a day (TID) | INTRAVENOUS | 0 refills | Status: AC

## 2021-01-08 MED ORDER — INSULIN ASPART 100 UNIT/ML IJ SOLN: INTRAMUSCULAR | 11 refills | Status: DC

## 2021-01-08 MED ORDER — QUETIAPINE FUMARATE 25 MG PO TABS: 25.0000 mg | ORAL_TABLET | Freq: Every day | ORAL | Status: AC

## 2021-01-08 MED ORDER — OXYCODONE HCL 5 MG PO TABS: 5.0000 mg | ORAL_TABLET | Freq: Four times a day (QID) | ORAL | 0 refills | Status: AC | PRN

## 2021-01-08 NOTE — Progress Notes (Signed)
Discharged to [facility] via transpoort via Insurance account manager. Pt discharged with right upper arm picc single lumen and foley cath in place with clear amber urine and stat lock

## 2021-01-08 NOTE — TOC Transition Note (Signed)
Transition of Care [facility]) - CM/SW Discharge Note   Patient Details  Name: Jillian Leblanc MRN: [id number] Date of Birth: [birth year]  Transition of [facility] CM/SW Contact:  Mearl Latin, LCSW Phone Number: 01/08/2021, 11:34 AM   Clinical Narrative:    Patient will DC to: [facility] Anticipated DC date: 01/08/21 Family notified: Daughter, [name] Transport by: Dayna Barker  Daughter completing paperwork at 1pm at [facility].  Per MD patient ready for DC to [facility]. RN to call report prior to discharge [phone] and ask for 100 hall RN (Room 100B)). RN, patient, patient's family, and facility notified of DC. Discharge Summary and FL2 sent to facility. DC packet on chart. Ambulance transport requested for patient.   CSW will sign off for now as social work intervention is no longer needed. Please consult [place] again if new needs arise.     Final next level of care: Skilled Nursing Facility Barriers to Discharge: Barriers Resolved   Patient Goals and CMS Choice Patient states their goals for this hospitalization and ongoing recovery are:: Rehab CMS Medicare.gov Compare Post Acute Care list provided to:: Patient Represenative (must comment) Choice offered to / list presented to : Adult Children  Discharge Placement   Existing PASRR number confirmed : 01/08/21          Patient chooses bed at: [facility] Patient to be transferred to facility by: [facility] Name of family member notified: Daughter Patient and family notified of of transfer: 01/08/21  Discharge Plan and Services In-house Referral: Clinical Social Work   Post Acute Care Choice: Skilled Nursing Facility                               Social Determinants of Health (SDOH) Interventions     Readmission Risk Interventions No flowsheet data found.

## 2021-01-08 NOTE — Care Management Important Message (Signed)
Important Message  Patient Details  Name: Jillian Leblanc MRN: [id number] Date of Birth: 1945/04/10   Medicare Important Message Given:  Yes     Sherilyn Banker 01/08/2021, 2:46 PM

## 2021-01-08 NOTE — Progress Notes (Signed)
Physical Therapy Treatment Patient Details Name: Jillian Leblanc MRN: [id number] DOB: 03-05-1945 Today's Date: 01/08/2021   History of Present Illness 75 y/o female admitted 11/25 with severe sepsis with UTI and possible L5-S1 abscess/osteo. Pt with Afib with RVR, hypotension and AMS with transfer to ICU 11/27. 01/04/21 L4, L5 laminectomies, evacuation of epidural abscess, L3 to S2 / pelvis posterolateral instrumented fusion PMHx:  DM, PPM, neuropathy, falls, L5 fx Oct '22    PT Comments    Pt received in bed, agreeable to participation in therapy. She required mod assist rolling, +2 max assist sidelying<>sit, and min guard assist sitting balance EOB. Unable to power up to stand with RW from EOB. Pt demonstrates deficits in strength, balance, activity tolerance, and motor planning. Pt assisted back to bed in R sidelying at end of session.    Recommendations for follow up therapy are one component of a multi-disciplinary discharge planning process, led by the attending physician.  Recommendations may be updated based on patient status, additional functional criteria and insurance authorization.  Follow Up Recommendations  Skilled nursing-short term rehab (<3 hours/day)     Assistance Recommended at Discharge Frequent or constant Supervision/Assistance  Equipment Recommendations  Wheelchair (measurements PT);Wheelchair cushion (measurements PT);Hospital bed;Other (comment) (hoyer lift)    Recommendations for Other Services       Precautions / Restrictions Precautions Precautions: Fall;Back Required Braces or Orthoses: Spinal Brace Spinal Brace: Thoracolumbosacral orthotic Restrictions Other Position/Activity Restrictions: brace not present in room     Mobility  Bed Mobility Overal bed mobility: Needs Assistance Bed Mobility: Rolling;Sidelying to Sit;Sit to Sidelying Rolling: Mod assist Sidelying to sit: +2 for physical assistance;Max assist;HOB elevated   Sit to supine: +2 for  physical assistance;Max assist   General bed mobility comments: +rail, assist for all aspects of mobility, continual verbal cues for sequencing and relaxation    Transfers Overall transfer level: Needs assistance                 General transfer comment: Pt unable to power up to stand with RW due to weakness and pain. Scooted along EOB with max assist.    Ambulation/Gait                   Stairs             Wheelchair Mobility    Modified Rankin (Stroke Patients Only)       Balance Overall balance assessment: Needs assistance Sitting-balance support: Bilateral upper extremity supported;Feet supported Sitting balance-Leahy Scale: Poor Sitting balance - Comments: UE support and min guard for static sitting                                    Cognition Arousal/Alertness: Awake/alert Behavior During Therapy: Anxious Overall Cognitive Status: No family/caregiver present to determine baseline cognitive functioning Area of Impairment: Attention;Problem solving;Awareness;Following commands;Memory;Safety/judgement                   Current Attention Level: Sustained Memory: Decreased short-term memory;Decreased recall of precautions Following Commands: Follows one step commands with increased time Safety/Judgement: Decreased awareness of safety;Decreased awareness of deficits Awareness: Emergent Problem Solving: Requires verbal cues;Requires tactile cues;Slow processing;Decreased initiation;Difficulty sequencing General Comments: Pt anxious and distracted by pain. Difficulty staying on task.        Exercises      General Comments General comments (skin integrity, edema, etc.): poor motor planning  Pertinent Vitals/Pain Pain Assessment: Faces Faces Pain Scale: Hurts whole lot Pain Location: back Pain Descriptors / Indicators: Grimacing;Guarding;Discomfort;Moaning Pain Intervention(s): Limited activity within patient's  tolerance;Repositioned    Home Living                          Prior Function            PT Goals (current goals can now be found in the care plan section) Acute Rehab PT Goals Patient Stated Goal: home Progress towards PT goals: Progressing toward goals    Frequency    Min 2X/week      PT Plan Current plan remains appropriate    Co-evaluation              AM-PAC PT "6 Clicks" Mobility   Outcome Measure  Help needed turning from your back to your side while in a flat bed without using bedrails?: Total Help needed moving from lying on your back to sitting on the side of a flat bed without using bedrails?: Total Help needed moving to and from a bed to a chair (including a wheelchair)?: Total Help needed standing up from a chair using your arms (e.g., wheelchair or bedside chair)?: Total Help needed to walk in hospital room?: Total Help needed climbing 3-5 steps with a railing? : Total 6 Click Score: 6    End of Session Equipment Utilized During Treatment: Gait belt Activity Tolerance: Patient limited by pain;Patient limited by fatigue Patient left: in bed;with call bell/phone within reach;with bed alarm set Nurse Communication: Mobility status PT Visit Diagnosis: Other abnormalities of gait and mobility (R26.89);Muscle weakness (generalized) (M62.81)     Time: [id number] PT Time Calculation (min) (ACUTE ONLY): 23 min  Charges:  $Therapeutic Activity: 23-37 mins                     Lorrin Goodell, [place]  Office # [phone] Pager [phone]    Jillian Leblanc 01/08/2021, 12:00 PM

## 2021-01-08 NOTE — Progress Notes (Signed)
[facility]  Date of Admission:  12/18/2020    Principal Problem:   E coli bacteremia Active Problems:   AKI (acute kidney injury) ([facility])   Type 2 diabetes mellitus with hypoglycemia without coma (H[facility])   Hypothyroidism   Severe sepsis (HCC)   Acute metabolic encephalopathy   Acute hyponatremia   Thrombocytopenia (HCC)   Chronic anemia   Chronic diastolic CHF (congestive heart failure) (HCC)   Discitis of lumbar region   Sepsis with acute renal failure without septic shock (HCC)   Wheeze   Pancytopenia (HCC)          Assessment: 75 year old female with multiple medical problems including diabetes, heart failure requiring pacemaker presented with back pain and fever and was admitted for acute renal failure.  Found to have E. coli bacteremia.    - CT abdomen pelvis showed L4-S1 infection with gas suspicion for osteomyelitis.  Enteric mention radiology did not feel comfortable aspirating.  TEE was not done.  - ID was engaged and recommended cefazolin x6 weeks followed by oral antibiotics as an vertebral infection was suspected to be secondary to E. coli.  -Infectious diseases reengaged this patient taken to the OR with neurosurgery for laminectomy and epidural abscess evacuation due to concern of worsening pain and cauda equina syndrome.  #Hx of E. coli bacteremia in the setting of epidural abscess status post evacuation and laminectomy with cultures + Ecoli on 01/04/2021 #L4-L5 osteomyelitis Recommendations: -Restarted the clock on cefazolin and treat with 8 weeks weeks of antibiotics from OR(end date 03/01/2021). OPAT orders and follow-up with ID placed  #Pseudomonas arginosa + Urine Cx -UA + squamous epithelia  and non squamous epithelia cells as such likely represents a dirty urine -Would not recollect urine as pt is asymptomatic (denies dysuria/abdominal pain) on current regimen of cefazolin.  #Fever #Hallucinations -Resolved   ID will  sign off.  Microbiology:   Antibiotics: cefazolin Cultures: OR Cx from 12/12 + Ecoli 12/14 Urine Cx+ pseudomonas aeruginosa  SUBJECTIVE: Continues to be afebrile. Pt denies any UTI symptoms including dysuria and abdominal pain.  Review of Systems: Review of Systems  All other systems reviewed and are negative.   Scheduled Meds:  sodium chloride   Intravenous Once   acetaminophen  1,000 mg Oral Q8H   arformoterol  15 mcg Nebulization BID   calcium carbonate  800 mg of elemental calcium Oral BID   calcium-vitamin D  0.5 tablet Oral BID   Chlorhexidine Gluconate Cloth  6 each Topical Daily   fluticasone  1 spray Each Nare Daily   furosemide  40 mg Oral Daily   gabapentin  300 mg Oral BID   heparin injection (subcutaneous)  5,000 Units Subcutaneous Q8H   insulin aspart  0-9 Units Subcutaneous TID WC   levothyroxine  150 mcg Oral Q0600   lidocaine  1 patch Transdermal Daily   loratadine  10 mg Oral Daily   mouth rinse  15 mL Mouth Rinse BID   mupirocin ointment  1 application Nasal BID   nystatin   Topical BID   pantoprazole  40 mg Oral Daily   polyethylene glycol  17 g Oral Daily   pramipexole  1.5 mg Oral TID   QUEtiapine  25 mg Oral QHS   revefenacin  175 mcg Nebulization Daily   sodium chloride flush  10-40 mL Intracatheter Q12H   sodium chloride flush  10-40 mL Intracatheter Q12H   sodium chloride  1  g Oral BID WC   Continuous Infusions:  sodium chloride Stopped (12/21/20 1240)   sodium chloride      ceFAZolin (ANCEF) IV 2 g (01/08/21 0922)   PRN Meds:.sodium chloride, benzonatate, dextromethorphan, docusate sodium, guaiFENesin, haloperidol lactate, HYDROmorphone (DILAUDID) injection, ipratropium-albuterol, lip balm, methocarbamol, naLOXone (NARCAN)  injection, oxyCODONE, phenol, sodium chloride flush, sodium chloride flush Allergies  Allergen Reactions   Pentosan Polysulfate Sodium     Other reaction(s): Other (See Comments) ELMIRON Y Drug hair fell out  10/21/2009 12:00:00 AM by Johny Blamer CNA 1   Elmiron [Pentosan Polysulfate]    Naloxone Other (See Comments)    Hallucinations Confusion Nightmares    OBJECTIVE: Vitals:   01/08/21 0500 01/08/21 0747 01/08/21 0816 01/08/21 1200  BP:  139/68  (!) 109/49  Pulse:  82  80  Resp:  15  17  Temp:  97.7 F (36.5 C)  98.8 F (37.1 C)  TempSrc:  Oral  Oral  SpO2:   98%   Weight: 91.7 kg     Height: 5' (1.524 m)      Body mass index is 39.48 kg/m.  Physical Exam Constitutional:      Appearance: Normal appearance.  HENT:     Head: Normocephalic and atraumatic.     Right Ear: Tympanic membrane normal.     Left Ear: Tympanic membrane normal.     Nose: Nose normal.     Mouth/Throat:     Mouth: Mucous membranes are moist.  Eyes:     Extraocular Movements: Extraocular movements intact.     Conjunctiva/sclera: Conjunctivae normal.     Pupils: Pupils are equal, round, and reactive to light.  Cardiovascular:     Rate and Rhythm: Normal rate and regular rhythm.     Heart sounds: No murmur heard.   No friction rub. No gallop.  Pulmonary:     Effort: Pulmonary effort is normal.     Breath sounds: Normal breath sounds.  Abdominal:     General: Abdomen is flat.     Palpations: Abdomen is soft.  Musculoskeletal:        General: Normal range of motion.  Skin:    General: Skin is warm and dry.  Neurological:     General: No focal deficit present.     Mental Status: She is alert and oriented to person, place, and time.  Psychiatric:        Mood and Affect: Mood normal.      Lab Results Lab Results  Component Value Date   WBC 5.0 01/07/2021   HGB 9.1 (L) 01/07/2021   HCT 28.1 (L) 01/07/2021   MCV 90.4 01/07/2021   PLT 187 01/07/2021    Lab Results  Component Value Date   CREATININE 0.75 01/08/2021   BUN 6 (L) 01/08/2021   NA 130 (L) 01/08/2021   K 3.6 01/08/2021   CL 96 (L) 01/08/2021   CO2 27 01/08/2021    Lab Results  Component Value Date   ALT <5 01/06/2021    AST 22 01/06/2021   GGT 109 (H) 11/23/2020   ALKPHOS 147 (H) 01/06/2021   BILITOT 2.6 (H) 01/06/2021        Danelle Earthly, MD [facility] 01/08/2021, 1:54 PM

## 2021-01-08 NOTE — Discharge Summary (Addendum)
PATIENT DETAILS Name: Jillian Leblanc Age: 75 y.o. Sex: female Date of Birth: Nov 09, 1945 MRN: [id number]. Admitting Physician: Rhetta Mura, DO LMB:[id number], [name], MD  Admit Date: 12/18/2020 Discharge date: 01/08/2021  Recommendations for Outpatient Follow-up:  Follow up with PCP in 1-2 weeks Needs  weekly CBC with differential, CMP, CRP, ESR and CK while on IV antibiotics. End date of IV antibiotics 03/01/2021 Please ensure follow-up with Dr. Candiss Norse at infectious disease clinic and neurosurgery-Dr. Venetia Constable. Please ensure outpatient follow-up with cardiology. Failed voiding trial-Foley catheter reinserted on 12/16-please ensure urology follow-up in 2 weeks.  Admitted From:  Home  Disposition: SNF   Home Health: No  Equipment/Devices: PICC line placed on 12/15  Discharge Condition: Stable  CODE STATUS: FULL CODE  Diet recommendation:  Diet Order             Diet - low sodium heart healthy           Diet heart healthy/carb modified Room service appropriate? No; Fluid consistency: Thin; Fluid restriction: 1500 mL Fluid  Diet effective now                    Brief Summary: [name] a 75 y.o. 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 coli bacteremia-along with vertebral osteomyelitis involving L4-S1.  Initially managed in the [facility] stability-transferred to the [facility] hospitalist service.  Patient was evaluated by neurosurgery, ID-started on appropriate antimicrobial therapy-neurosurgery felt that patient could be watched with IV antibiotics.  However for the hospital course was complicated by worsening back pain-repeat CT L-spine on 12/12-showed worsening findings-subsequently neurosurgery performed L4-L5 laminectomy and evacuation of epidural abscess.  See below for further  details.  Brief Hospital Course: Septic shock due to E. coli bacteremia-L4-S1 osteomyelitis with epidural abscess: Sepsis physiology only improved.  Initially plans were for nonoperative management and IV antibiotics-however due to worsening pain/lower extremity weakness-patient underwent L4-L5 laminectomy and evacuation of epidural abscess on 12/12.  Postoperative course complicated by fever and encephalopathy that has since resolved.  Repeat blood cultures on 12/14 negative so far-intraoperative cultures positive for E. coli as well.  Urine culture positive for Pseudomonas but highly suspicious that this is a colonization rather than a true infection.  PICC line placed on 12/15.  Per ID recommendation-patient needs to be on IV Ancef till 03/01/2021.  Discussed with covering neurosurgical MD-Dr. Ellene Route today-okay to discharge from his point of view as well.  Please obtain weekly CBC with differential, CMP, ESR, CRP, CK weekly while on IV antibiotics and fax results to the infectious disease clinic.  Dr. Candiss Norse at infectious disease clinic and Dr. Dorothea Glassman.   Acute metabolic encephalopathy: Due to septic shock/AKI/hospital delirium-mental status had improved-however postoperatively-patient developed delirium-likely due to fever and narcotics.  Managed with supportive care-much more awake/alert compared to the past few days-continue with nightly Seroquel.    Acute hypoxic respiratory failure: Multifactorial etiology-due to asthma exacerbation and pulmonary edema.  Continues to clinically improve-with supportive care and diuretic regimen-on minimal amount of oxygen.  No longer on steroids.   Acute urinary retention: Likely due to pain and epidural abscess-Foley discontinued on 12/15-unfortunately failed voiding trial and developed acute urinary retention again.  Foley catheter will be reinserted today-patient will need outpatient voiding trial/urology follow-up-we will defer this to attending MD  at SNF.Marland Kitchen   Hyponatremia: Suspect this was  due to volume overload-has responded to IV Lasix/salt tablets and IV albumin-appreciate nephrology input.  Continue to maintain fluid restriction-remains on salt tablets-resume oral furosemide.  Hypokalemia: Repleted   AKI: Mild-felt to be due to intravascular volume depletion-responding well to IV albumin/Lasix combination.   PAF with RVR: Maintaining sinus rhythm-initially required amiodarone-reviewed cardiology note-anticoagulation issue to be addressed upon outpatient follow-up.   Thrombocytopenia: Resolved-likely due to sepsis.   Anemia: Multifactorial etiology-but due to acute illness-some amount of perioperative blood loss anemia-did require 1 unit of PRBC transfusion on 12/13-CBC currently stable-watch closely for   HFpEF: volume overload is due to hypoalbuminemia/third spacing rather than decompensated heart failure.  Volume status has stabilized following several doses of IV Lasix.   DM-2 (A1c 8.27 October 2020): CBGs stable-no longer on Levemir-continue SSI and follow trend.    Hypothyroidism: Continue Synthroid-both TSH/free T4 elevated on recent labs-likely due to sick euthyroid syndrome-recommendations are to repeat thyroid function test in a few weeks.   GERD: Continue PPI   Dysphagia: Tolerating dysphagia 3 diet-I suspect this is from deconditioning.  Diet has now been upgraded to a heart healthy diet which she has been tolerating well.  SLP followed closely.   Left elbow pain: Seems to have improved-CT elbow on 12/10-does not show any collection-shows cellulitis.  Already on IV Ancef.  Dopplers negative for DVT as well.  For now supportive care.    Morbid Obesity Estimated body mass index is 39.31 kg/m as calculated from the following:   Height as of this encounter: 5' (1.524 m).   Weight as of this encounter: 91.3 kg.    RN pressure injury documentation: Pressure Injury 12/19/20 Sacrum Medial Stage 2 -  Partial thickness loss  of dermis presenting as a shallow open injury with a red, pink wound bed without slough. stage 2  sacrum  4cm x .5cm (Active)  12/19/20 1640  Location: Sacrum  Location Orientation: Medial  Staging: Stage 2 -  Partial thickness loss of dermis presenting as a shallow open injury with a red, pink wound bed without slough.  Wound Description (Comments): stage 2  sacrum  4cm x .5cm  Present on Admission: Yes    Procedures: 12/12>>L4, L5 laminectomies, evacuation of epidural abscess, L3 to S2 / pelvis posterolateral instrumented fusion  Discharge Diagnoses:  Principal Problem:   E coli bacteremia Active Problems:   AKI (acute kidney injury) ([place])   Type 2 diabetes mellitus with hypoglycemia without coma (HCC)   Hypothyroidism   Severe sepsis (HCC)   Acute metabolic encephalopathy   Acute hyponatremia   Thrombocytopenia (HCC)   Chronic anemia   Chronic diastolic CHF (congestive heart failure) (HCC)   Discitis of lumbar region   Sepsis with acute renal failure without septic shock (HCC)   Wheeze   Pancytopenia ([place])   Discharge Instructions:  Activity:  As tolerated with Full fall precautions use walker/cane & assistance as needed   Discharge Instructions     [facility] pharmacist to adjust dose for Vancomycin, Aminoglycosides and other anti-infective therapies as requested by physician.   Complete by: As directed    [facility] to provide Cath Flo 31m   Complete by: As directed    Administer for PICC line occlusion and as ordered by physician for other access device issues.   Anaphylaxis Kit: Provided to treat any anaphylactic reaction to the medication being provided to the patient if First Dose or when requested by physician   Complete by: As directed  Epinephrine 80m/ml vial / amp: Administer 0.341m(0.44m244msubcutaneously once for moderate to severe anaphylaxis, nurse to call physician and pharmacy when reaction occurs and call 911 if needed for  immediate care   Diphenhydramine 59m66m IV vial: Administer 25-59mg56mIM PRN for first dose reaction, rash, itching, mild reaction, nurse to call physician and pharmacy when reaction occurs   Sodium Chloride 0.9% NS 500ml 244mAdminister if needed for hypovolemic blood pressure drop or as ordered by physician after call to physician with anaphylactic reaction   Call MD for:  persistant nausea and vomiting   Complete by: As directed    Call MD for:  severe uncontrolled pain   Complete by: As directed    Change dressing on IV access line weekly and PRN   Complete by: As directed    Diet - low sodium heart healthy   Complete by: As directed    Discharge instructions   Complete by: As directed    Follow with Primary MD  ArvindGuadlupe Spanishn 1-2 weeks  Please ensure follow-up with neurosurgery, infectious disease and cardiology.  Please get a complete blood count and chemistry panel checked by your Primary MD at your next visit, and again as instructed by your Primary MD.  Get Medicines reviewed and adjusted: Please take all your medications with you for your next visit with your Primary MD  Laboratory/radiological data: Please request your Primary MD to go over all hospital tests and procedure/radiological results at the follow up, please ask your Primary MD to get all Hospital records sent to his/her office.  In some cases, they will be blood work, cultures and biopsy results pending at the time of your discharge. Please request that your primary care M.D. follows up on these results.  Also Note the following: If you experience worsening of your admission symptoms, develop shortness of breath, life threatening emergency, suicidal or homicidal thoughts you must seek medical attention immediately by calling 911 or calling your MD immediately  if symptoms less severe.  You must read complete instructions/literature along with all the possible adverse reactions/side effects for all the  Medicines you take and that have been prescribed to you. Take any new Medicines after you have completely understood and accpet all the possible adverse reactions/side effects.   Do not drive when taking Pain medications or sleeping medications (Benzodaizepines)  Do not take more than prescribed Pain, Sleep and Anxiety Medications. It is not advisable to combine anxiety,sleep and pain medications without talking with your primary care practitioner  Special Instructions: If you have smoked or chewed Tobacco  in the last 2 yrs please stop smoking, stop any regular Alcohol  and or any Recreational drug use.  Wear Seat belts while driving.  Please note: You were cared for by a hospitalist during your hospital stay. Once you are discharged, your primary care physician will handle any further medical issues. Please note that NO REFILLS for any discharge medications will be authorized once you are discharged, as it is imperative that you return to your primary care physician (or establish a relationship with a primary care physician if you do not have one) for your post hospital discharge needs so that they can reassess your need for medications and monitor your lab values.   Flush IV access with Sodium Chloride 0.9% and Heparin 10 units/ml or 100 units/ml   Complete by: As directed    Home infusion instructions - [facility]   Complete by: As directed  Instructions: Flush IV access with Sodium Chloride 0.9% and Heparin 10units/ml or 100units/ml   Change dressing on IV access line: Weekly and PRN   Instructions Cath Flo 38m: Administer for PICC Line occlusion and as ordered by physician for other access device   [facility] pharmacist to adjust dose for: Vancomycin, Aminoglycosides and other anti-infective therapies as requested by physician   Increase activity slowly   Complete by: As directed    Method of administration may be changed at the discretion of home infusion  pharmacist based upon assessment of the patient and/or caregivers ability to self-administer the medication ordered   Complete by: As directed    No wound care   Complete by: As directed       Allergies as of 01/08/2021       Reactions   Pentosan Polysulfate Sodium    Other reaction(s): Other (See Comments) ELMIRON Y Drug hair fell out 10/21/2009 12:00:00 AM by LMabeline CarasCNA 1   Elmiron [pentosan Polysulfate]    Naloxone Other (See Comments)   Hallucinations Confusion Nightmares        Medication List     STOP taking these medications    amitriptyline 100 MG tablet Commonly known as: ELAVIL   aspirin EC 81 MG tablet   calcium carbonate 500 MG chewable tablet Commonly known as: TUMS - dosed in mg elemental calcium   dapagliflozin propanediol 10 MG Tabs tablet Commonly known as: FARXIGA   glipiZIDE 2.5 MG 24 hr tablet Commonly known as: GLUCOTROL XL   hydrOXYzine 25 MG tablet Commonly known as: ATARAX   loperamide 2 MG capsule Commonly known as: IMODIUM   methocarbamol 500 MG tablet Commonly known as: ROBAXIN   ondansetron 4 MG disintegrating tablet Commonly known as: ZOFRAN-ODT   oxybutynin 10 MG 24 hr tablet Commonly known as: DITROPAN-XL       TAKE these medications    acetaminophen 500 MG tablet Commonly known as: TYLENOL Take 2 tablets (1,000 mg total) by mouth every 8 (eight) hours as needed.   calcium-vitamin D 250-100 MG-UNIT tablet Take 1 tablet by mouth 2 (two) times daily.   ceFAZolin  IVPB Commonly known as: ANCEF Inject 2 g into the vein every 8 (eight) hours. Indication: E.coli epidural abscess/osteo First Dose: Yes Last Day of Therapy:  03/01/21 Labs - Once weekly:  CBC/D and BMP, Labs - Every other week:  ESR and CRP Method of administration: IV Push Method of administration may be changed at the discretion of home infusion pharmacist based upon assessment of the patient and/or caregiver's ability to self-administer the  medication ordered.   furosemide 40 MG tablet Commonly known as: LASIX Take 1 tablet (40 mg total) by mouth daily. What changed: when to take this   gabapentin 300 MG capsule Commonly known as: NEURONTIN Take 1 capsule (300 mg total) by mouth 2 (two) times daily.   insulin aspart 100 UNIT/ML injection Commonly known as: novoLOG 0-9 Units, Subcutaneous, 3 times daily with meals, Sensitive (thin, NPO, renal) CBG < 70: Implement Hypoglycemia measures CBG 70 - 120: 0 units CBG 121 - 150: 1 unit CBG 151 - 200: 2 units CBG 201 - 250: 3 units CBG 251 - 300: 5 units CBG 301 - 350: 7 units CBG 351 - 400: 9 units CBG > 400: call MD   ipratropium-albuterol 0.5-2.5 (3) MG/3ML Soln Commonly known as: DUONEB Take 3 mLs by nebulization every 8 (eight) hours.   levothyroxine 150 MCG tablet Commonly known as:  SYNTHROID Take 150 mcg by mouth daily.   lidocaine 5 % Commonly known as: LIDODERM Place 1 patch onto the skin daily. Remove & Discard patch within 12 hours or as directed by MD Start taking on: January 09, 2021   oxyCODONE 5 MG immediate release tablet Commonly known as: Oxy IR/ROXICODONE Take 1-2 tablets (5-10 mg total) by mouth every 6 (six) hours as needed for moderate pain or severe pain.   pantoprazole 40 MG tablet Commonly known as: PROTONIX Take 1 tablet (40 mg total) by mouth daily. Start taking on: January 09, 2021   polyethylene glycol 17 g packet Commonly known as: MIRALAX / GLYCOLAX Take 17 g by mouth daily as needed.   pramipexole 1.5 MG tablet Commonly known as: MIRAPEX Take 1.5 mg by mouth 3 (three) times daily.   QUEtiapine 25 MG tablet Commonly known as: SEROQUEL Take 1 tablet (25 mg total) by mouth at bedtime.   sodium chloride 1 g tablet Take 1 tablet (1 g total) by mouth 2 (two) times daily with a meal.               Discharge Care Instructions  (From admission, onward)           Start     Ordered   01/08/21 0000  Change dressing on IV  access line weekly and PRN  (Home infusion instructions - [facility] )        01/08/21 1004            Follow-up Information     Guadlupe Spanish, MD. Schedule an appointment as soon as possible for a visit in 1 week(s).   Specialty: Internal Medicine Contact information: [place] [address] [phone]         Constance Haw, MD Follow up in 2 week(s).   Specialty: Cardiology Contact information: [address] [place] [address] [phone]         Laurice Record, MD Follow up on 01/29/2021.   Specialty: Infectious Diseases Why: appt at 1:45 pm. Contact information: [address] [place] [address] [phone]         Judith Part, MD. Schedule an appointment as soon as possible for a visit in 1 week(s).   Specialty: Neurosurgery Contact information: [address] [place] [address] [phone]                Allergies  Allergen Reactions   Pentosan Polysulfate Sodium     Other reaction(s): Other (See Comments) ELMIRON Y Drug hair fell out 10/21/2009 12:00:00 AM by [name] CNA 1   Elmiron [Pentosan Polysulfate]    Naloxone Other (See Comments)    Hallucinations Confusion Nightmares      Consultations: :PCCM, ID, neurosurgery, cardiology   Other Procedures/Studies: CT ABDOMEN PELVIS WO CONTRAST  Result Date: 12/19/2020 CLINICAL DATA:  75 year old female abdominal and pelvic pain. Sepsis. EXAM: CT ABDOMEN AND PELVIS WITHOUT CONTRAST TECHNIQUE: Multidetector CT imaging of the abdomen and pelvis was performed following the standard protocol without IV contrast. COMPARISON:  11/22/2020 lumbar spine CT 06/08/2018 CT FINDINGS: Please note that parenchymal and vascular abnormalities may be missed as intravenous contrast was not administered. Lower chest: No acute abnormality. Pacemaker/AICD leads are present. Hepatobiliary: No hepatic abnormalities are  identified. The patient is status post cholecystectomy. There is no evidence of intrahepatic or extrahepatic biliary dilatation. 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 There is no evidence gas within the renal collecting system or renal parenchyma. No hydronephrosis is identified. Scattered areas of RIGHT renal scarring are present. The adrenal glands are unremarkable. Stomach/Bowel: There is no evidence of bowel obstruction, definite bowel wall thickening or bowel inflammatory changes. Vascular/Lymphatic: Aortic atherosclerosis. No enlarged abdominal or pelvic lymph nodes. Reproductive: Status post hysterectomy. No adnexal masses. Other: Subcutaneous edema is noted. There is no evidence of ascites, focal collection or pneumoperitoneum. Musculoskeletal: A compression fracture of L5 is again identified, increased from 35% on 11/22/2020 to 50% today, now with irregularity of the L5 vertebral body. There are foci of gas in the L4-S1 paravertebral regions with small amount of gas posterior along the spinal canal at L5-S1. No discrete focal collection is identified on this noncontrast study. Disc protrusion at L5-S1 is again noted. A healing RIGHT L1 transverse process fracture remote T12 compression fracture is present. Sacral stimulator again noted IMPRESSION: 1. Increasing compression fracture and heterogeneity of L5 with new paravertebral gas from L4-S1 since 11/22/2020. Although L5 bony changes and paravertebral gas could be related to healing fracture and gas extension from vacuum disc, this is suspicious for infection/osteomyelitis given patient's sepsis. 2. 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 No evidence of gas within the renal collecting system or renal parenchyma. 3. Aortic Atherosclerosis (ICD10-I70.0). Critical Value/emergent results were called by telephone at the time of  interpretation on 12/19/2020 at 10:58 am to provider [facility] , who verbally acknowledged these results. Electronically Signed   By: Margarette Canada M.D.   On: 12/19/2020 11:00   DG Lumbar Spine 2-3 Views  Result Date: 01/04/2021 CLINICAL DATA:  Intraoperative evaluation, laminectomy infusion EXAM: LUMBAR SPINE - 2-3 VIEW COMPARISON:  01/04/2021 FINDINGS: Four fluoroscopic images are obtained during the performance of the procedure and are provided for interpretation only. Images demonstrate intrapedicular screws within for contiguous levels of the lumbar spine. Evaluation is limited due to technique and body habitus. Please refer to operative report. FLUOROSCOPY TIME:  2 minutes and 7 seconds IMPRESSION: 1. Limited intraoperative evaluation during multilevel lumbar levin ectomy infusion. Electronically Signed   By: Randa Ngo M.D.   On: 01/04/2021 23:30   DG Elbow 2 Views Left  Result Date: 12/30/2020 CLINICAL DATA:  Left elbow pain and swelling. EXAM: LEFT ELBOW - 2 VIEW COMPARISON:  None. FINDINGS: There is no evidence of fracture, dislocation, or joint effusion. There is no evidence of arthropathy or other focal bone abnormality. Soft tissues are unremarkable. IMPRESSION: Negative. Electronically Signed   By: Marijo Conception M.D.   On: 12/30/2020 11:01   CT LUMBAR SPINE WO CONTRAST  Result Date: 01/04/2021 CLINICAL DATA:  Low back pain, cauda equina syndrome suspected EXAM: CT LUMBAR SPINE WITHOUT CONTRAST TECHNIQUE: Multidetector CT imaging of the lumbar spine was performed without intravenous contrast administration. Multiplanar CT image reconstructions were also generated. COMPARISON:  CT 12/19/2020 FINDINGS: Segmentation: 5 lumbar type vertebrae. Alignment: Focal kyphosis at T12 related to a T12 anterior compression deformity. No significant listhesis. Vertebrae: Progressive sclerosis and bony destruction of the L5 vertebral body. Height loss remains at about 40%. There is approximately 4  mm of bony retropulsion, similar to prior exam. Paraspinal and other soft tissues: There is bilateral hydronephrosis likely related to a markedly distended urinary bladder which is partially visualized. Aortoiliac atherosclerotic calcifications. No lymphadenopathy. There is a right-sided sacral nerve stimulator in place. Disc levels: Unchanged chronic disc and degenerative endplate changes at  T11-T12, T12-L1, and L1-L2. L2-L3: No significant spinal canal or neural foraminal narrowing. L3-L4: Nonspecific mineralization along the anterior spinal canal in a linear and longitudinal orientation. This is new since recent CT on November 26th. L4-L5: Unchanged broad-based disc bulging. Decreased amount of paraspinal gas but increased soft tissue density along the anterior and lateral paraspinal soft tissues (axial series 3, image 96). Moderate to severe right and mild-to-moderate left-sided neural foraminal narrowing. There is mild to moderate spinal canal narrowing. L5-S1: Decreased amount of intradiscal and paraspinal gas. Unchanged degree of moderate spinal canal stenosis and bilateral neural foraminal narrowing. IMPRESSION: Progressive sclerosis and bony destruction of the L5 vertebral body, though with unchanged overall 40% height loss. Decreased paraspinal and intradiscal gas. Increased anterior and lateral paraspinal soft tissue density at L4-L5. Findings are concerning for discitis osteomyelitis with paraspinal phlegmon. Unchanged degree of spinal canal stenosis and bilateral neural foraminal narrowing at L4-L5 and L5-S1. Assessment for epidural collections is limited by noncontrast CT. If able, MRI would be ideal for further evaluation. Otherwise CT with contrast may be useful. Mineralization along the anterior spinal canal at L3-L4, in a linear and longitudinal orientation, new since recent CT on 11/26. This finding is nonspecific but could potentially be related to arachnoiditis, though it is unclear on  noncontrast CT if this process is intradural or extradural. Electronically Signed   By: Maurine Simmering M.D.   On: 01/04/2021 11:11   NM Pulmonary Perfusion  Result Date: 12/21/2020 CLINICAL DATA:  Hypotension, tachypnea, atrial fibrillation with rapid ventricular response in setting of sepsis, question pulmonary embolism EXAM: NUCLEAR MEDICINE PERFUSION LUNG SCAN TECHNIQUE: Perfusion images were obtained in multiple projections after intravenous injection of radiopharmaceutical. Ventilation scans intentionally deferred if perfusion scan and chest x-ray adequate for interpretation during COVID 19 epidemic. RADIOPHARMACEUTICALS:  4.4 mCi Tc-57mMAA IV COMPARISON:  None Correlation: Chest radiograph 12/21/2020 FINDINGS: Normal perfusion lung scan. No perfusion defects. IMPRESSION: Normal perfusion lung scan. Electronically Signed   By: MLavonia DanaM.D.   On: 12/21/2020 11:43   CT ELBOW LEFT WO CONTRAST  Result Date: 01/02/2021 CLINICAL DATA:  Soft tissue infection suspected, elbow, xray done E. coli bacteremia with lumbar spine osteo-/epidural abscess-with persistent left elbow pain. EXAM: CT OF THE UPPER LEFT EXTREMITY WITHOUT CONTRAST TECHNIQUE: Multidetector CT imaging of the upper left extremity was performed according to the standard protocol. COMPARISON:  X-ray 12/30/2020 FINDINGS: Technical note: Examination is limited secondary to beam hardening artifact related to patient positioning of the elbow adjacent to the abdominal wall. Elbow is flexed resulting in nonstandard imaging planes. Bones/Joint/Cartilage No acute fracture. No dislocation. Elbow joint spaces are preserved. No erosion or periosteal elevation. No appreciable elbow joint effusion. Ligaments Suboptimally assessed by CT. Muscles and Tendons Evaluation of the musculotendinous structures is significantly limited. No obvious intramuscular fluid collection. Soft tissues Soft tissue edema with ill-defined fluid most pronounced along the ulnar  aspect of the proximal forearm. No organized fluid collection is evident by noncontrast CT. No soft tissue gas. Catheter tubing is visualized within the upper arm. IMPRESSION: 1. Limited exam. 2. Soft tissue edema with ill-defined fluid most pronounced along the ulnar aspect of the proximal forearm, suggesting cellulitis. No organized fluid collection is evident. 3. No appreciable elbow joint effusion to suggest septic arthritis. 4. No acute osseous abnormality. Electronically Signed   By: NDavina PokeD.O.   On: 01/02/2021 17:17   CT L-SPINE NO CHARGE  Result Date: 12/19/2020 CLINICAL DATA:  Epidural abscess.  Chronic back pain.  EXAM: CT LUMBAR SPINE WITHOUT CONTRAST TECHNIQUE: Multiplanar CT image reconstructions of the lumbar spine were generated from data acquired during abdominopelvic CT of the same date. Abdominopelvic CT findings dictated separately. COMPARISON:  Abdominopelvic CT same date. Lumbar spine CT 11/22/2020. FINDINGS: Segmentation: There are 5 lumbar type vertebral bodies. Alignment: Normal. Vertebrae: Mildly progressive loss of vertebral body height at the L5 burst fracture compared with the CT from last month. There is approximately 40% loss of vertebral body height with progressive irregular lucency and sclerosis in the L5 vertebral body and 1st sacral segment, suspicious for osteomyelitis. A chronic superior endplate compression deformity at T12 is unchanged. The sacroiliac joints appear unremarkable. There is a right sacral spinal stimulator. Paraspinal and other soft tissues: There is gas and soft tissue stranding within the paraspinal soft tissues at L4 and L5. No focal fluid collections are identified. There is gas within the urinary bladder wall. Additional abdominopelvic findings deferred to separate abdominopelvic CT report. Disc levels: Stable chronic disc and endplate degeneration at T11-12, T12-L1 and L1-2. No significant disc space findings at L2-3 or L3-4. L4-5: Disc height  is preserved. Annular disc bulging appears mildly progressive, and there is new gas within the left lateral recess. There is mild facet and ligamentous hypertrophy. Lateral recess and foraminal narrowing appears progressive. No drainable fluid collection identified. L5-S1: Interval decreased gas within the disc with progressive annular disc bulging and a right paracentral disc protrusion. As above, there are small collections of gas within the paraspinal soft tissues as well as the ventral epidural space. Lateral recess and foraminal narrowing appears progressive. No drainable fluid collection identified. IMPRESSION: 1. Progressive loss of vertebral body height at the L5 fracture compared with prior CT of 1 month ago. Osseous changes within the L5 vertebral body and upper sacrum are suspicious for developing osteomyelitis and discitis, especially given the paraspinal inflammatory changes and small gas collections. 2. Increased lateral recess and foraminal narrowing bilaterally at L4-5 and L5-S1, in part due to epidural gas. Assessment for epidural fluid collections is limited by noncontrast CT. Consider MRI for further evaluation if possible (patient has a sacral spinal stimulator). If unable to undergo MRI, dedicated postcontrast imaging of the lumbar spine could be performed. 3. Abdominopelvic findings dictated separately. Electronically Signed   By: Richardean Sale M.D.   On: 12/19/2020 12:46   DG CHEST PORT 1 VIEW  Result Date: 12/21/2020 CLINICAL DATA:  Sepsis, wheezing, shortness of breath. EXAM: PORTABLE CHEST 1 VIEW COMPARISON:  12/19/2020 and CT chest 09/29/2020. FINDINGS: [name] slightly rotated. Trachea is midline. Heart is enlarged, stable. Thoracic aorta is calcified. Pacemaker lead tips are in the right atrium and right ventricle. Lungs are somewhat low in volume with mild pulmonary vascular prominence. No pleural fluid. Right shoulder arthroplasty. Gaseous distension of the stomach,  incompletely imaged. IMPRESSION: 1. Mild pulmonary vascular prominence may be due to low lung volumes. Difficult to exclude mild edema. 2.  Aortic atherosclerosis (ICD10-I70.0). Electronically Signed   By: Lorin Picket M.D.   On: 12/21/2020 08:06   DG CHEST PORT 1 VIEW  Result Date: 12/19/2020 CLINICAL DATA:  Severe sepsis. EXAM: PORTABLE CHEST 1 VIEW COMPARISON:  12/18/2020. FINDINGS: The heart is enlarged and the pulmonary vasculature is mildly distended. Atherosclerotic calcification of the aorta is noted. Lung volumes are low. No consolidation, effusion, or pneumothorax. A dual lead pacemaker is present over the left chest. The bony structures are stable. IMPRESSION: Cardiomegaly with pulmonary vascular congestion. Electronically Signed   By: Mickel Baas  Lovena Le M.D.   On: 12/19/2020 03:09   DG Chest Port 1 View  Result Date: 12/18/2020 CLINICAL DATA:  Initial evaluation for questionable sepsis. EXAM: PORTABLE CHEST 1 VIEW COMPARISON:  Prior radiograph from 12/04/2020. FINDINGS: Dual lead left-sided pacemaker/AICD in place. Mild cardiomegaly, stable. Mediastinal silhouette within normal limits. Aortic atherosclerosis. Lungs are hypoinflated. Mild diffuse pulmonary vascular congestion. No pleural effusion. No consolidative airspace disease or focal infiltrate. No pneumothorax. Right shoulder arthroplasty noted.  No acute osseous finding. IMPRESSION: 1. Cardiomegaly with mild diffuse pulmonary vascular congestion. 2. No other active cardiopulmonary disease. 3.  Aortic Atherosclerosis (ICD10-I70.0). Electronically Signed   By: Jeannine Boga M.D.   On: 12/18/2020 20:38   DG Chest Port 1V same Day  Result Date: 01/06/2021 CLINICAL DATA:  Shortness of breath EXAM: PORTABLE CHEST 1 VIEW COMPARISON:  12/21/2020 FINDINGS: Transverse diameter of heart is increased. There are no signs of alveolar pulmonary edema. Increased interstitial markings are seen in the left lower lung fields. There is no focal  consolidation. There is no pleural effusion or pneumothorax. Pacemaker battery is seen in the left infraclavicular region. There is previous right shoulder arthroplasty. IMPRESSION: Increased interstitial markings are seen in the left lower lung fields suggesting interstitial pneumonitis. Less likely possibility would be asymmetric pulmonary edema. Electronically Signed   By: Elmer Picker M.D.   On: 01/06/2021 11:13   DG C-Arm 1-60 Min-No Report  Result Date: 01/04/2021 Fluoroscopy was utilized by the requesting physician.  No radiographic interpretation.   DG C-Arm 1-60 Min-No Report  Result Date: 01/04/2021 Fluoroscopy was utilized by the requesting physician.  No radiographic interpretation.   DG HIP UNILAT WITH PELVIS 2-3 VIEWS RIGHT  Result Date: 12/19/2020 CLINICAL DATA:  Right hip and back pain after multiple falls. EXAM: DG HIP (WITH OR WITHOUT PELVIS) 2-3V RIGHT COMPARISON:  12/04/2020. FINDINGS: No fracture or bone lesion. Hip joints, SI joints and symphysis pubis are normally spaced and aligned. Left lower quadrant stimulator device and right mid pelvic lead are unchanged. Soft tissues otherwise unremarkable. IMPRESSION: 1. No fracture or acute finding. Electronically Signed   By: Lajean Manes M.D.   On: 12/19/2020 11:07   VAS [place] LOWER EXTREMITY VENOUS (DVT)  Result Date: 12/19/2020  Lower Venous DVT Study Patient Name:  CHARLIE CHAR  Date of Exam:   12/19/2020 Medical Rec #: [id number]       Accession #:    [id number] Date of Birth: August 09, 1945       Patient Gender: F Patient Age:   13 years Exam Location:  [facility] Procedure:      VAS [place] LOWER EXTREMITY VENOUS (DVT) Referring Phys: RAVI PAHWANI --------------------------------------------------------------------------------  Indications: Edema.  Risk Factors: CHF. Limitations: Body habitus and patient intolerance to compression at some segments. Comparison Study: No prior studies. Performing Technologist: Darlin Coco RDMS, RVT  Examination Guidelines: A complete evaluation includes B-mode imaging, spectral Doppler, color Doppler, and power Doppler as needed of all accessible portions of each vessel. Bilateral testing is considered an integral part of a complete examination. Limited examinations for reoccurring indications may be performed as noted. The reflux portion of the exam is performed with the patient in reverse Trendelenburg.  +---------+---------------+---------+-----------+----------+-------------------+  RIGHT     Compressibility Phasicity Spontaneity Properties Thrombus Aging       +---------+---------------+---------+-----------+----------+-------------------+  CFV                       Yes       Yes  Unable to compress                                                               due to patient                                                                   discomfort           +---------+---------------+---------+-----------+----------+-------------------+  SFJ       Full                                                                  +---------+---------------+---------+-----------+----------+-------------------+  FV Prox   Full                                                                  +---------+---------------+---------+-----------+----------+-------------------+  FV Mid    Full                                                                  +---------+---------------+---------+-----------+----------+-------------------+  FV Distal Full                                                                  +---------+---------------+---------+-----------+----------+-------------------+  PFV       Full                                                                  +---------+---------------+---------+-----------+----------+-------------------+  POP       Full            Yes       Yes                                          +---------+---------------+---------+-----------+----------+-------------------+  PTV       Full                                                                  +---------+---------------+---------+-----------+----------+-------------------+  PERO      Full                                                                  +---------+---------------+---------+-----------+----------+-------------------+   +---------+---------------+---------+-----------+----------+--------------+  LEFT      Compressibility Phasicity Spontaneity Properties Thrombus Aging  +---------+---------------+---------+-----------+----------+--------------+  CFV       Full            Yes       Yes                                    +---------+---------------+---------+-----------+----------+--------------+  SFJ       Full                                                             +---------+---------------+---------+-----------+----------+--------------+  FV Prox   Full                                                             +---------+---------------+---------+-----------+----------+--------------+  FV Mid    Full                                                             +---------+---------------+---------+-----------+----------+--------------+  FV Distal Full                                                             +---------+---------------+---------+-----------+----------+--------------+  PFV       Full                                                             +---------+---------------+---------+-----------+----------+--------------+  POP       Full            Yes       Yes                                    +---------+---------------+---------+-----------+----------+--------------+  PTV       Full                                                             +---------+---------------+---------+-----------+----------+--------------+  PERO      Full                                                              +---------+---------------+---------+-----------+----------+--------------+     Summary: RIGHT: - There is no evidence of deep vein thrombosis in the lower extremity. However, portions of this examination were limited- see technologist comments above.  - Cystic structure found in the popliteal fossa.  LEFT: - There is no evidence of deep vein thrombosis in the lower extremity.  - No cystic structure found in the popliteal fossa.  *See table(s) above for measurements and observations. Electronically signed by Orlie Pollen on 12/19/2020 at 10:36:03 AM.    Final    VAS [place] UPPER EXTREMITY VENOUS DUPLEX  Result Date: 01/03/2021 UPPER VENOUS STUDY  Patient Name:  FAVIOLA KLARE  Date of Exam:   01/01/2021 Medical Rec #: [id number]       Accession #:    [id number] Date of Birth: [birth year]       Patient Gender: F Patient Age:   43 years Exam [facility]:  [facility] Procedure:      VAS [place] UPPER EXTREMITY VENOUS DUPLEX Referring Phys: Oren Binet --------------------------------------------------------------------------------  Indications: Swelling Limitations: Bandages. Comparison Study: no prior Performing Technologist: Archie Patten RVS  Examination Guidelines: A complete evaluation includes B-mode imaging, spectral Doppler, color Doppler, and power Doppler as needed of all accessible portions of each vessel. Bilateral testing is considered an integral part of a complete examination. Limited examinations for reoccurring indications may be performed as noted.  Right Findings: +----------+------------+---------+-----------+----------+-------+  RIGHT      Compressible Phasicity Spontaneous Properties Summary  +----------+------------+---------+-----------+----------+-------+  Subclavian                 Yes        Yes                         +----------+------------+---------+-----------+----------+-------+  Left Findings: +----------+------------+---------+-----------+----------+-------+  LEFT        Compressible Phasicity Spontaneous Properties Summary  +----------+------------+---------+-----------+----------+-------+  IJV            Full        Yes        Yes                         +----------+------------+---------+-----------+----------+-------+  Subclavian     Full        Yes        Yes                         +----------+------------+---------+-----------+----------+-------+  Axillary       Full        Yes        Yes                         +----------+------------+---------+-----------+----------+-------+  Brachial       Full        Yes        Yes                         +----------+------------+---------+-----------+----------+-------+  Radial  Full                                               +----------+------------+---------+-----------+----------+-------+  Ulnar          Full                                               +----------+------------+---------+-----------+----------+-------+  Cephalic       Full                                               +----------+------------+---------+-----------+----------+-------+  Summary:  Right: No evidence of thrombosis in the subclavian.  Left: No evidence of deep vein thrombosis in the upper extremity. No evidence of superficial vein thrombosis in the upper extremity.  *See table(s) above for measurements and observations.  Diagnosing physician: Harold Barban MD Electronically signed by Harold Barban MD on 01/03/2021 at 7:41:50 PM.    Final    ECHOCARDIOGRAM LIMITED  Result Date: 12/21/2020    ECHOCARDIOGRAM LIMITED REPORT   Patient Name:   SHELBEE APGAR Date of Exam: 12/21/2020 Medical Rec #:  [id number]      Height:       60.0 in Accession #:    [id number]     Weight:       214.3 lb Date of Birth:  1945/12/20      BSA:          1.922 m Patient Age:    76 years       BP:           99/65 mmHg Patient Gender: F              HR:           123 bpm. Exam Location:  Inpatient Procedure: Limited Echo, Color Doppler and Cardiac Doppler Indications:     Bacteremia  History:        Patient has prior history of Echocardiogram examinations, most                 recent 11/24/2020. CHF, Arrythmias:Dysrhythmia; Risk                 Factors:Diabetes.  Sonographer:    Maudry Mayhew MHA, RDMS, RVT, RDCS Referring Phys: [place]  1. LVEF appears moderately depressed. Endocardium is not well seen in all segments. WOuld recomm limited echo with Definity to confirm wall motion and LVEF . Left ventricular diastolic parameters are indeterminate.  2. Right ventricular systolic function is normal. The right ventricular size is mildly enlarged.  3. Right atrial size was mildly dilated.  4. Trivial mitral valve regurgitation.  5. Tricuspid valve regurgitation is moderate.  6. The aortic valve is tricuspid. Aortic valve regurgitation is not visualized. Aortic valve sclerosis is present, with no evidence of aortic valve stenosis. FINDINGS  Left Ventricle: LVEF appears moderately depressed. Endocardium is not well seen in all segments. WOuld recomm limited echo with Definity to confirm wall motion and LVEF. The left ventricular internal cavity size was normal in size. Left ventricular diastolic parameters are indeterminate. Right Ventricle: The  right ventricular size is mildly enlarged. Right vetricular wall thickness was not assessed. Right ventricular systolic function is normal. Left Atrium: Left atrial size was normal in size. Right Atrium: Right atrial size was mildly dilated. Pericardium: There is no evidence of pericardial effusion. Mitral Valve: There is mild thickening of the mitral valve leaflet(s). Mild to moderate mitral annular calcification. Trivial mitral valve regurgitation. Tricuspid Valve: The tricuspid valve is normal in structure. Tricuspid valve regurgitation is moderate. Aortic Valve: The aortic valve is tricuspid. Aortic valve regurgitation is not visualized. Aortic valve sclerosis is present, with no evidence of aortic valve  stenosis. Pulmonic Valve: The pulmonic valve was grossly normal. Pulmonic valve regurgitation is not visualized. Additional Comments: A device lead is visualized. TRICUSPID VALVE TR Peak grad:   40.4 mmHg TR Vmax:        318.00 cm/s Dorris Carnes MD Electronically signed by Dorris Carnes MD Signature Date/Time: 12/21/2020/5:00:32 PM    Final    [place] EKG SITE RITE  Result Date: 01/07/2021 If Site Rite image not attached, placement could not be confirmed due to current cardiac rhythm.    TODAY-DAY OF DISCHARGE:  Subjective:   Jillian Leblanc today has no headache,no chest abdominal pain,no new weakness tingling or numbness, feels much better wants to go home today.   Objective:   Blood pressure 139/68, pulse 82, temperature 97.7 F (36.5 C), temperature source Oral, resp. rate 15, height 5' (1.524 m), weight 91.7 kg, SpO2 98 %.  Intake/Output Summary (Last 24 hours) at 01/08/2021 1006 Last data filed at 01/07/2021 2110 Gross per 24 hour  Intake 10 ml  Output 650 ml  Net -640 ml   Filed Weights   01/06/21 0500 01/07/21 0500 01/08/21 0500  Weight: 92 kg 91.3 kg 91.7 kg    Exam: Awake Alert, Oriented *3, No new F.N deficits, Normal affect [place].AT,PERRAL Supple Neck,No JVD, No cervical lymphadenopathy appriciated.  Symmetrical Chest wall movement, Good air movement bilaterally, CTAB RRR,No Gallops,Rubs or new Murmurs, No Parasternal Heave +ve B.Sounds, Abd Soft, Non tender, No organomegaly appriciated, No rebound -guarding or rigidity. No Cyanosis, Clubbing or edema, No new Rash or bruise   PERTINENT RADIOLOGIC STUDIES: [place] EKG SITE RITE  Result Date: 01/07/2021 If Site Rite image not attached, placement could not be confirmed due to current cardiac rhythm.    PERTINENT LAB RESULTS: CBC: Recent Labs    01/06/21 0554 01/07/21 0147  WBC 5.7 5.0  HGB 8.0* 9.1*  HCT 24.1* 28.1*  PLT 190 187   CMET CMP     Component Value Date/Time   NA 130 (L) 01/08/2021 0111   K 3.6  01/08/2021 0111   CL 96 (L) 01/08/2021 0111   CO2 27 01/08/2021 0111   GLUCOSE 91 01/08/2021 0111   BUN 6 (L) 01/08/2021 0111   CREATININE 0.75 01/08/2021 0111   CALCIUM 7.1 (L) 01/08/2021 0111   PROT 6.2 (L) 01/06/2021 0951   ALBUMIN 2.4 (L) 01/07/2021 0147   AST 22 01/06/2021 0951   ALT <5 01/06/2021 0951   ALKPHOS 147 (H) 01/06/2021 0951   BILITOT 2.6 (H) 01/06/2021 0951   GFRNONAA >60 01/08/2021 0111   GFRAA 56 (L) 08/16/2019 0448    GFR Estimated Creatinine Clearance: 63.3 mL/min (by C-G formula based on SCr of 0.75 mg/dL). No results for input(s): LIPASE, AMYLASE in the last 72 hours. No results for input(s): CKTOTAL, CKMB, CKMBINDEX, TROPONINI in the last 72 hours. Invalid input(s): POCBNP No results for input(s): DDIMER in the last 72  hours. No results for input(s): HGBA1C in the last 72 hours. No results for input(s): CHOL, HDL, LDLCALC, TRIG, CHOLHDL, LDLDIRECT in the last 72 hours. No results for input(s): TSH, T4TOTAL, T3FREE, THYROIDAB in the last 72 hours.  Invalid input(s): FREET3 Recent Labs    01/06/21 0951  VITAMINB12 333   Coags: No results for input(s): INR in the last 72 hours.  Invalid input(s): PT Microbiology: Recent Results (from the past 240 hour(s))  Resp Panel by RT-PCR (Flu A&B, Covid) Nasopharyngeal Swab     Status: None   Collection Time: 12/29/20  3:29 PM   Specimen: Nasopharyngeal Swab; Nasopharyngeal(NP) swabs in vial transport medium  Result Value Ref Range Status   SARS Coronavirus 2 by RT PCR NEGATIVE NEGATIVE Final    Comment: (NOTE) SARS-CoV-2 target nucleic acids are NOT DETECTED.  The SARS-CoV-2 RNA is generally detectable in upper respiratory specimens during the acute phase of infection. The lowest concentration of SARS-CoV-2 viral copies this assay can detect is 138 copies/mL. A negative result does not preclude SARS-Cov-2 infection and should not be used as the sole basis for treatment or other patient management  decisions. A negative result may occur with  improper specimen collection/handling, submission of specimen other than nasopharyngeal swab, presence of viral mutation(s) within the areas targeted by this assay, and inadequate number of viral copies(<138 copies/mL). A negative result must be combined with clinical observations, patient history, and epidemiological information. The expected result is Negative.  Fact Sheet for Patients:  EntrepreneurPulse.com.au  Fact Sheet for Healthcare Providers:  IncredibleEmployment.be  This test is no t yet approved or cleared by the [place] FDA and  has been authorized for detection and/or diagnosis of SARS-CoV-2 by FDA under an Emergency Use Authorization (EUA). This EUA will remain  in effect (meaning this test can be used) for the duration of the COVID-19 declaration under Section 564(b)(1) of the Act, 21 U.S.C.section 360bbb-3(b)(1), unless the authorization is terminated  or revoked sooner.       Influenza A by PCR NEGATIVE NEGATIVE Final   Influenza B by PCR NEGATIVE NEGATIVE Final    Comment: (NOTE) The Xpert Xpress SARS-CoV-2/FLU/RSV plus assay is intended as an aid in the diagnosis of influenza from Nasopharyngeal swab specimens and should not be used as a sole basis for treatment. Nasal washings and aspirates are unacceptable for Xpert Xpress SARS-CoV-2/FLU/RSV testing.  Fact Sheet for Patients: EntrepreneurPulse.com.au  Fact Sheet for Healthcare Providers: IncredibleEmployment.be  This test is not yet approved or cleared by the [place] FDA and has been authorized for detection and/or diagnosis of SARS-CoV-2 by FDA under an Emergency Use Authorization (EUA). This EUA will remain in effect (meaning this test can be used) for the duration of the COVID-19 declaration under Section 564(b)(1) of the Act, 21 U.S.C. section 360bbb-3(b)(1), unless the  authorization is terminated or revoked.  Performed at [facility], [place] [address], [place], [place] [address]   Culture, blood (routine x 2)     Status: None   Collection Time: 12/30/20 11:33 AM   Specimen: BLOOD RIGHT HAND  Result Value Ref Range Status   Specimen Description BLOOD RIGHT HAND  Final   Special Requests   Final    BOTTLES DRAWN AEROBIC AND ANAEROBIC Blood Culture adequate volume   Culture   Final    NO GROWTH 5 DAYS Performed at [facility], [place] [address], [place], [place] [address]    Report Status 01/04/2021 FINAL  Final  Culture, blood (routine x 2)     Status: None   Collection Time: 12/30/20 11:43 AM   Specimen: BLOOD RIGHT HAND  Result Value Ref Range Status   Specimen Description BLOOD RIGHT HAND  Final   Special Requests   Final    BOTTLES DRAWN AEROBIC AND ANAEROBIC Blood Culture adequate volume   Culture   Final    NO GROWTH 5 DAYS Performed at [facility], [address], [place], [place] [address]    Report Status 01/04/2021 FINAL  Final  Surgical pcr screen     Status: Abnormal   Collection Time: 01/04/21  1:07 PM   Specimen: Nasal Mucosa; Nasal Swab  Result Value Ref Range Status   MRSA, PCR POSITIVE (A) NEGATIVE Final    Comment: RESULT CALLED TO, READ BACK BY AND VERIFIED WITH: RM C GANOE [id number] AT 1443 BY CM    Staphylococcus aureus POSITIVE (A) NEGATIVE Final    Comment: (NOTE) The Xpert SA Assay (FDA approved for NASAL specimens in patients 81 years of age and older), is one component of a comprehensive surveillance program. It is not intended to diagnose infection nor to guide or monitor treatment. Performed at [facility], [place] [address], [place], [place] [address]   Aerobic/Anaerobic Culture w Gram Stain (surgical/deep wound)     Status: None (Preliminary result)   Collection Time: 01/04/21  6:48 PM   Specimen: PATH Other; Tissue  Result Value Ref Range Status   Specimen Description ABSCESS  Final    Special Requests EPIDURAL PT ON ANCEF  Final   Gram Stain   Final    NO WBC SEEN NO ORGANISMS SEEN Performed at [facility], [address], [place], [place] [address]    Culture   Final    RARE ESCHERICHIA COLI NO ANAEROBES ISOLATED; CULTURE IN PROGRESS FOR 5 DAYS    Report Status PENDING  Incomplete   Organism ID, Bacteria ESCHERICHIA COLI  Final      Susceptibility   Escherichia coli - MIC*    AMPICILLIN 8 SENSITIVE Sensitive     CEFAZOLIN <=4 SENSITIVE Sensitive     CEFEPIME <=0.12 SENSITIVE Sensitive     CEFTAZIDIME <=1 SENSITIVE Sensitive     CEFTRIAXONE <=0.25 SENSITIVE Sensitive     CIPROFLOXACIN <=0.25 SENSITIVE Sensitive     GENTAMICIN <=1 SENSITIVE Sensitive     IMIPENEM <=0.25 SENSITIVE Sensitive     TRIMETH/SULFA <=20 SENSITIVE Sensitive     AMPICILLIN/SULBACTAM <=2 SENSITIVE Sensitive     PIP/TAZO <=4 SENSITIVE Sensitive     * RARE ESCHERICHIA COLI  Culture, blood (routine x 2)     Status: None (Preliminary result)   Collection Time: 01/06/21  8:46 AM   Specimen: BLOOD LEFT HAND  Result Value Ref Range Status   Specimen Description BLOOD LEFT HAND  Final   Special Requests   Final    BOTTLES DRAWN AEROBIC ONLY Blood Culture adequate volume   Culture   Final    NO GROWTH 2 DAYS Performed at [facility], [address], [place], [place] [address]    Report Status PENDING  Incomplete  Culture, blood (routine x 2)     Status: None (Preliminary result)   Collection Time: 01/06/21  8:47 AM   Specimen: BLOOD RIGHT HAND  Result Value Ref Range Status   Specimen Description BLOOD RIGHT HAND  Final   Special Requests   Final    BOTTLES DRAWN AEROBIC ONLY  Blood Culture adequate volume   Culture   Final    NO GROWTH 2 DAYS Performed at [facility], [place] [address] [place], [place] [address]    Report Status PENDING  Incomplete  Urine Culture     Status: Abnormal   Collection Time: 01/06/21  2:24 PM   Specimen: Urine, Catheterized  Result  Value Ref Range Status   Specimen Description URINE, CATHETERIZED  Final   Special Requests   Final    NONE Performed at [facility], [place] [address] [place], [place] [address]    Culture >=100,000 COLONIES/mL PSEUDOMONAS AERUGINOSA (A)  Final   Report Status 01/08/2021 FINAL  Final   Organism ID, Bacteria PSEUDOMONAS AERUGINOSA (A)  Final      Susceptibility   Pseudomonas aeruginosa - MIC*    CEFTAZIDIME 4 SENSITIVE Sensitive     CIPROFLOXACIN <=0.25 SENSITIVE Sensitive     GENTAMICIN <=1 SENSITIVE Sensitive     IMIPENEM 2 SENSITIVE Sensitive     PIP/TAZO 8 SENSITIVE Sensitive     CEFEPIME 2 SENSITIVE Sensitive     * >=100,000 COLONIES/mL PSEUDOMONAS AERUGINOSA  Resp Panel by RT-PCR (Flu A&B, Covid) Nasopharyngeal Swab     Status: None   Collection Time: 01/07/21  1:48 PM   Specimen: Nasopharyngeal Swab; Nasopharyngeal(NP) swabs in vial transport medium  Result Value Ref Range Status   SARS Coronavirus 2 by RT PCR NEGATIVE NEGATIVE Final    Comment: (NOTE) SARS-CoV-2 target nucleic acids are NOT DETECTED.  The SARS-CoV-2 RNA is generally detectable in upper respiratory specimens during the acute phase of infection. The lowest concentration of SARS-CoV-2 viral copies this assay can detect is 138 copies/mL. A negative result does not preclude SARS-Cov-2 infection and should not be used as the sole basis for treatment or other patient management decisions. A negative result may occur with  improper specimen collection/handling, submission of specimen other than nasopharyngeal swab, presence of viral mutation(s) within the areas targeted by this assay, and inadequate number of viral copies(<138 copies/mL). A negative result must be combined with clinical observations, patient history, and epidemiological information. The expected result is Negative.  Fact Sheet for Patients:  EntrepreneurPulse.com.au  Fact Sheet for Healthcare Providers:   IncredibleEmployment.be  This test is no t yet approved or cleared by the [place] FDA and  has been authorized for detection and/or diagnosis of SARS-CoV-2 by FDA under an Emergency Use Authorization (EUA). This EUA will remain  in effect (meaning this test can be used) for the duration of the COVID-19 declaration under Section 564(b)(1) of the Act, 21 U.S.C.section 360bbb-3(b)(1), unless the authorization is terminated  or revoked sooner.       Influenza A by PCR NEGATIVE NEGATIVE Final   Influenza B by PCR NEGATIVE NEGATIVE Final    Comment: (NOTE) The Xpert Xpress SARS-CoV-2/FLU/RSV plus assay is intended as an aid in the diagnosis of influenza from Nasopharyngeal swab specimens and should not be used as a sole basis for treatment. Nasal washings and aspirates are unacceptable for Xpert Xpress SARS-CoV-2/FLU/RSV testing.  Fact Sheet for Patients: EntrepreneurPulse.com.au  Fact Sheet for Healthcare Providers: IncredibleEmployment.be  This test is not yet approved or cleared by the [place] FDA and has been authorized for detection and/or diagnosis of SARS-CoV-2 by FDA under an Emergency Use Authorization (EUA). This EUA will remain in effect (meaning this test can be used) for the duration of the COVID-19 declaration under Section 564(b)(1) of the Act, 21 U.S.C. section 360bbb-3(b)(1),  unless the authorization is terminated or revoked.  Performed at [facility], [place] [address] [place], [place] [address]     FURTHER DISCHARGE INSTRUCTIONS:  Get Medicines reviewed and adjusted: Please take all your medications with you for your next visit with your Primary MD  Laboratory/radiological data: Please request your Primary MD to go over all hospital tests and procedure/radiological results at the follow up, please ask your Primary MD to get all Hospital records sent to his/her office.  In some cases,  they will be blood work, cultures and biopsy results pending at the time of your discharge. Please request that your primary care M.D. goes through all the records of your hospital data and follows up on these results.  Also Note the following: If you experience worsening of your admission symptoms, develop shortness of breath, life threatening emergency, suicidal or homicidal thoughts you must seek medical attention immediately by calling 911 or calling your MD immediately  if symptoms less severe.  You must read complete instructions/literature along with all the possible adverse reactions/side effects for all the Medicines you take and that have been prescribed to you. Take any new Medicines after you have completely understood and accpet all the possible adverse reactions/side effects.   Do not drive when taking Pain medications or sleeping medications (Benzodaizepines)  Do not take more than prescribed Pain, Sleep and Anxiety Medications. It is not advisable to combine anxiety,sleep and pain medications without talking with your primary care practitioner  Special Instructions: If you have smoked or chewed Tobacco  in the last 2 yrs please stop smoking, stop any regular Alcohol  and or any Recreational drug use.  Wear Seat belts while driving.  Please note: You were cared for by a hospitalist during your hospital stay. Once you are discharged, your primary care physician will handle any further medical issues. Please note that NO REFILLS for any discharge medications will be authorized once you are discharged, as it is imperative that you return to your primary care physician (or establish a relationship with a primary care physician if you do not have one) for your post hospital discharge needs so that they can reassess your need for medications and monitor your lab values.  Total Time spent coordinating discharge including counseling, education and face to face time equals 35  minutes.  SignedOren Binet 01/08/2021 10:06 AM

## 2021-01-08 NOTE — Progress Notes (Signed)
Report given to [facility]

## 2021-01-10 LAB — AEROBIC/ANAEROBIC CULTURE W GRAM STAIN (SURGICAL/DEEP WOUND): Gram Stain: NONE SEEN

## 2021-01-11 LAB — CULTURE, BLOOD (ROUTINE X 2)
Culture: NO GROWTH
Culture: NO GROWTH
Special Requests: ADEQUATE
Special Requests: ADEQUATE

## 2021-01-12 ENCOUNTER — Emergency Department (HOSPITAL_COMMUNITY): Payer: Medicare Other

## 2021-01-12 ENCOUNTER — Other Ambulatory Visit: Payer: Self-pay

## 2021-01-12 ENCOUNTER — Inpatient Hospital Stay (HOSPITAL_COMMUNITY)
Admission: EM | Admit: 2021-01-12 | Discharge: 2021-01-22 | DRG: 698 | Disposition: A | Discharge status: Home Health | Payer: Medicare Other | Source: Skilled Nursing Facility | Attending: Internal Medicine | Admitting: Internal Medicine

## 2021-01-12 DIAGNOSIS — T148XXA Other injury of unspecified body region, initial encounter: Secondary | ICD-10-CM | POA: Diagnosis not present

## 2021-01-12 DIAGNOSIS — E890 Postprocedural hypothyroidism: Secondary | ICD-10-CM | POA: Diagnosis present

## 2021-01-12 DIAGNOSIS — Z20822 Contact with and (suspected) exposure to covid-19: Secondary | ICD-10-CM | POA: Diagnosis present

## 2021-01-12 DIAGNOSIS — Z96611 Presence of right artificial shoulder joint: Secondary | ICD-10-CM | POA: Diagnosis present

## 2021-01-12 DIAGNOSIS — Y846 Urinary catheterization as the cause of abnormal reaction of the patient, or of later complication, without mention of misadventure at the time of the procedure: Secondary | ICD-10-CM | POA: Diagnosis present

## 2021-01-12 DIAGNOSIS — E119 Type 2 diabetes mellitus without complications: Secondary | ICD-10-CM | POA: Diagnosis present

## 2021-01-12 DIAGNOSIS — F05 Delirium due to known physiological condition: Secondary | ICD-10-CM | POA: Diagnosis not present

## 2021-01-12 DIAGNOSIS — E871 Hypo-osmolality and hyponatremia: Secondary | ICD-10-CM | POA: Diagnosis present

## 2021-01-12 DIAGNOSIS — Z794 Long term (current) use of insulin: Secondary | ICD-10-CM

## 2021-01-12 DIAGNOSIS — Z8661 Personal history of infections of the central nervous system: Secondary | ICD-10-CM

## 2021-01-12 DIAGNOSIS — N390 Urinary tract infection, site not specified: Secondary | ICD-10-CM

## 2021-01-12 DIAGNOSIS — R778 Other specified abnormalities of plasma proteins: Secondary | ICD-10-CM

## 2021-01-12 DIAGNOSIS — I480 Paroxysmal atrial fibrillation: Secondary | ICD-10-CM | POA: Diagnosis not present

## 2021-01-12 DIAGNOSIS — R338 Other retention of urine: Secondary | ICD-10-CM

## 2021-01-12 DIAGNOSIS — N3010 Interstitial cystitis (chronic) without hematuria: Secondary | ICD-10-CM | POA: Diagnosis present

## 2021-01-12 DIAGNOSIS — M109 Gout, unspecified: Secondary | ICD-10-CM | POA: Diagnosis present

## 2021-01-12 DIAGNOSIS — R404 Transient alteration of awareness: Secondary | ICD-10-CM | POA: Diagnosis not present

## 2021-01-12 DIAGNOSIS — E860 Dehydration: Secondary | ICD-10-CM | POA: Diagnosis present

## 2021-01-12 DIAGNOSIS — A4152 Sepsis due to Pseudomonas: Secondary | ICD-10-CM | POA: Diagnosis present

## 2021-01-12 DIAGNOSIS — L89152 Pressure ulcer of sacral region, stage 2: Secondary | ICD-10-CM | POA: Diagnosis present

## 2021-01-12 DIAGNOSIS — M7989 Other specified soft tissue disorders: Secondary | ICD-10-CM | POA: Diagnosis not present

## 2021-01-12 DIAGNOSIS — D638 Anemia in other chronic diseases classified elsewhere: Secondary | ICD-10-CM | POA: Diagnosis present

## 2021-01-12 DIAGNOSIS — R4182 Altered mental status, unspecified: Secondary | ICD-10-CM | POA: Diagnosis not present

## 2021-01-12 DIAGNOSIS — Z79899 Other long term (current) drug therapy: Secondary | ICD-10-CM | POA: Diagnosis not present

## 2021-01-12 DIAGNOSIS — X58XXXA Exposure to other specified factors, initial encounter: Secondary | ICD-10-CM | POA: Diagnosis present

## 2021-01-12 DIAGNOSIS — I82612 Acute embolism and thrombosis of superficial veins of left upper extremity: Secondary | ICD-10-CM | POA: Diagnosis present

## 2021-01-12 DIAGNOSIS — M79602 Pain in left arm: Secondary | ICD-10-CM | POA: Diagnosis not present

## 2021-01-12 DIAGNOSIS — Z888 Allergy status to other drugs, medicaments and biological substances status: Secondary | ICD-10-CM | POA: Diagnosis not present

## 2021-01-12 DIAGNOSIS — Z79890 Hormone replacement therapy (postmenopausal): Secondary | ICD-10-CM

## 2021-01-12 DIAGNOSIS — S43015A Anterior dislocation of left humerus, initial encounter: Secondary | ICD-10-CM | POA: Diagnosis present

## 2021-01-12 DIAGNOSIS — I248 Other forms of acute ischemic heart disease: Secondary | ICD-10-CM | POA: Diagnosis not present

## 2021-01-12 DIAGNOSIS — I5032 Chronic diastolic (congestive) heart failure: Secondary | ICD-10-CM | POA: Diagnosis present

## 2021-01-12 DIAGNOSIS — R410 Disorientation, unspecified: Secondary | ICD-10-CM | POA: Diagnosis not present

## 2021-01-12 DIAGNOSIS — Z950 Presence of cardiac pacemaker: Secondary | ICD-10-CM | POA: Diagnosis not present

## 2021-01-12 DIAGNOSIS — E876 Hypokalemia: Secondary | ICD-10-CM | POA: Diagnosis present

## 2021-01-12 DIAGNOSIS — E039 Hypothyroidism, unspecified: Secondary | ICD-10-CM | POA: Diagnosis present

## 2021-01-12 DIAGNOSIS — A419 Sepsis, unspecified organism: Secondary | ICD-10-CM | POA: Diagnosis not present

## 2021-01-12 DIAGNOSIS — Z7401 Bed confinement status: Secondary | ICD-10-CM

## 2021-01-12 DIAGNOSIS — G9341 Metabolic encephalopathy: Secondary | ICD-10-CM | POA: Diagnosis not present

## 2021-01-12 DIAGNOSIS — T83518A Infection and inflammatory reaction due to other urinary catheter, initial encounter: Secondary | ICD-10-CM | POA: Diagnosis not present

## 2021-01-12 DIAGNOSIS — G9340 Encephalopathy, unspecified: Secondary | ICD-10-CM | POA: Diagnosis not present

## 2021-01-12 DIAGNOSIS — R6520 Severe sepsis without septic shock: Secondary | ICD-10-CM | POA: Diagnosis not present

## 2021-01-12 DIAGNOSIS — M25512 Pain in left shoulder: Secondary | ICD-10-CM

## 2021-01-12 LAB — CBC WITH DIFFERENTIAL/PLATELET
Abs Immature Granulocytes: 0.11 10*3/uL — ABNORMAL HIGH (ref 0.00–0.07)
Basophils Absolute: 0 10*3/uL (ref 0.0–0.1)
Basophils Relative: 0 %
Eosinophils Absolute: 0.1 10*3/uL (ref 0.0–0.5)
Eosinophils Relative: 1 %
HCT: 26.9 % — ABNORMAL LOW (ref 36.0–46.0)
Hemoglobin: 8.7 g/dL — ABNORMAL LOW (ref 12.0–15.0)
Immature Granulocytes: 2 %
Lymphocytes Relative: 25 %
Lymphs Abs: 1.9 10*3/uL (ref 0.7–4.0)
MCH: 28.7 pg (ref 26.0–34.0)
MCHC: 32.3 g/dL (ref 30.0–36.0)
MCV: 88.8 fL (ref 80.0–100.0)
Monocytes Absolute: 0.5 10*3/uL (ref 0.1–1.0)
Monocytes Relative: 7 %
Neutro Abs: 5 10*3/uL (ref 1.7–7.7)
Neutrophils Relative %: 65 %
Platelets: 208 10*3/uL (ref 150–400)
RBC: 3.03 MIL/uL — ABNORMAL LOW (ref 3.87–5.11)
RDW: 17.2 % — ABNORMAL HIGH (ref 11.5–15.5)
WBC: 7.6 10*3/uL (ref 4.0–10.5)
nRBC: 0 % (ref 0.0–0.2)

## 2021-01-12 LAB — COMPREHENSIVE METABOLIC PANEL
ALT: 6 U/L (ref 0–44)
AST: 23 U/L (ref 15–41)
Albumin: 2.1 g/dL — ABNORMAL LOW (ref 3.5–5.0)
Alkaline Phosphatase: 189 U/L — ABNORMAL HIGH (ref 38–126)
Anion gap: 11 (ref 5–15)
BUN: 11 mg/dL (ref 8–23)
CO2: 30 mmol/L (ref 22–32)
Calcium: 6 mg/dL — CL (ref 8.9–10.3)
Chloride: 88 mmol/L — ABNORMAL LOW (ref 98–111)
Creatinine, Ser: 0.89 mg/dL (ref 0.44–1.00)
GFR, Estimated: >60 mL/min (ref >60)
Glucose, Bld: 131 mg/dL — ABNORMAL HIGH (ref 70–99)
Potassium: 2.3 mmol/L — CL (ref 3.5–5.1)
Sodium: 129 mmol/L — ABNORMAL LOW (ref 135–145)
Total Bilirubin: 1.1 mg/dL (ref 0.3–1.2)
Total Protein: 6.1 g/dL — ABNORMAL LOW (ref 6.5–8.1)

## 2021-01-12 LAB — URINALYSIS, ROUTINE W REFLEX MICROSCOPIC
Bilirubin Urine: NEGATIVE
Glucose, UA: 50 mg/dL — AB
Ketones, ur: NEGATIVE mg/dL
Nitrite: POSITIVE — AB
Protein, ur: 30 mg/dL — AB
Specific Gravity, Urine: 1.014 (ref 1.005–1.030)
WBC, UA: >50 WBC/hpf — ABNORMAL HIGH (ref 0–5)
pH: 6 (ref 5.0–8.0)

## 2021-01-12 LAB — LACTIC ACID, PLASMA
Lactic Acid, Venous: 1.8 mmol/L (ref 0.5–1.9)
Lactic Acid, Venous: 1.2 mmol/L (ref 0.5–1.9)

## 2021-01-12 LAB — PROTIME-INR
INR: 1.3 — ABNORMAL HIGH (ref 0.8–1.2)
Prothrombin Time: 16.1 seconds — ABNORMAL HIGH (ref 11.4–15.2)

## 2021-01-12 LAB — MAGNESIUM: Magnesium: 1.1 mg/dL — ABNORMAL LOW (ref 1.7–2.4)

## 2021-01-12 LAB — RESP PANEL BY RT-PCR (FLU A&B, COVID) ARPGX2
Influenza A by PCR: NEGATIVE
Influenza B by PCR: NEGATIVE
SARS Coronavirus 2 by RT PCR: NEGATIVE

## 2021-01-12 LAB — TROPONIN I (HIGH SENSITIVITY)
Troponin I (High Sensitivity): 155 ng/L (ref <18)
Troponin I (High Sensitivity): 145 ng/L (ref <18)

## 2021-01-12 MED ORDER — ACETAMINOPHEN 500 MG PO TABS
1000.0000 mg | ORAL_TABLET | Freq: Once | ORAL | Status: AC
  Administered 2021-01-12: 19:00:00 1000 mg via ORAL
  Filled 2021-01-12: qty 2

## 2021-01-12 MED ORDER — SODIUM CHLORIDE 0.9 % IV SOLN
2.0000 g | Freq: Two times a day (BID) | INTRAVENOUS | Status: DC
  Administered 2021-01-13 (×2): 2 g via INTRAVENOUS
  Filled 2021-01-12 (×2): qty 2

## 2021-01-12 MED ORDER — VANCOMYCIN HCL 1500 MG/300ML IV SOLN
1500.0000 mg | Freq: Once | INTRAVENOUS | Status: AC
  Administered 2021-01-12: 21:00:00 1500 mg via INTRAVENOUS
  Filled 2021-01-12: qty 300

## 2021-01-12 MED ORDER — LACTATED RINGERS IV SOLN: INTRAVENOUS | Status: DC

## 2021-01-12 MED ORDER — MAGNESIUM SULFATE 2 GM/50ML IV SOLN
2.0000 g | Freq: Once | INTRAVENOUS | Status: AC
  Administered 2021-01-13: 01:00:00 2 g via INTRAVENOUS
  Filled 2021-01-12: qty 50

## 2021-01-12 MED ORDER — CHLORHEXIDINE GLUCONATE CLOTH 2 % EX PADS
6.0000 | MEDICATED_PAD | Freq: Every day | CUTANEOUS | Status: DC
  Administered 2021-01-13 – 2021-01-21 (×9): 6 via TOPICAL

## 2021-01-12 MED ORDER — SODIUM CHLORIDE 0.9% FLUSH
10.0000 mL | Freq: Two times a day (BID) | INTRAVENOUS | Status: DC
  Administered 2021-01-13 – 2021-01-21 (×9): 10 mL

## 2021-01-12 MED ORDER — LACTATED RINGERS IV BOLUS (SEPSIS)
1000.0000 mL | Freq: Once | INTRAVENOUS | Status: AC
  Administered 2021-01-12: 19:00:00 1000 mL via INTRAVENOUS

## 2021-01-12 MED ORDER — ENOXAPARIN SODIUM 40 MG/0.4ML IJ SOSY
40.0000 mg | PREFILLED_SYRINGE | INTRAMUSCULAR | Status: DC
  Administered 2021-01-13 – 2021-01-17 (×5): 40 mg via SUBCUTANEOUS
  Filled 2021-01-12 (×5): qty 0.4

## 2021-01-12 MED ORDER — POTASSIUM CHLORIDE 10 MEQ/100ML IV SOLN
10.0000 meq | INTRAVENOUS | Status: AC
  Administered 2021-01-13 (×3): 10 meq via INTRAVENOUS
  Filled 2021-01-12 (×3): qty 100

## 2021-01-12 MED ORDER — METRONIDAZOLE 500 MG/100ML IV SOLN
500.0000 mg | Freq: Once | INTRAVENOUS | Status: AC
  Administered 2021-01-12: 20:00:00 500 mg via INTRAVENOUS
  Filled 2021-01-12: qty 100

## 2021-01-12 MED ORDER — CALCIUM GLUCONATE-NACL 2-0.675 GM/100ML-% IV SOLN
2.0000 g | Freq: Once | INTRAVENOUS | Status: AC
  Administered 2021-01-12: 22:00:00 2000 mg via INTRAVENOUS
  Filled 2021-01-12: qty 100

## 2021-01-12 MED ORDER — VANCOMYCIN HCL IN DEXTROSE 1-5 GM/200ML-% IV SOLN: 1000.0000 mg | INTRAVENOUS | Status: DC

## 2021-01-12 MED ORDER — POTASSIUM CHLORIDE CRYS ER 20 MEQ PO TBCR
40.0000 meq | EXTENDED_RELEASE_TABLET | Freq: Three times a day (TID) | ORAL | Status: DC
  Administered 2021-01-12: 23:00:00 40 meq via ORAL
  Filled 2021-01-12: qty 2

## 2021-01-12 MED ORDER — SODIUM CHLORIDE 0.9 % IV SOLN
2.0000 g | Freq: Three times a day (TID) | INTRAVENOUS | Status: DC
  Administered 2021-01-12: 19:00:00 2 g via INTRAVENOUS
  Filled 2021-01-12: qty 2

## 2021-01-12 NOTE — ED Notes (Signed)
Patient transported to X-ray 

## 2021-01-12 NOTE — Progress Notes (Signed)
Pharmacy Antibiotic Note  Jillian Leblanc is a 75 y.o. female admitted on 01/12/2021 with  Sepsis secondary to unknown source .  Pharmacy has been consulted for Cefepime and vancomycin dosing.  WBC wnl, SCr wnl   Plan: -Vancomycin 1500 mg IV load followed by Vancomycin 1000 mg IV Q 24 hrs. Goal AUC 400-550. Expected AUC: 515 SCr used: 0.89 -Cefepime 2 gm IV Q 12 hours  -Monitor CBC, renal fx, cultures and clinical progress -Vanc levels as indicated    Height: 5' (152.4 cm) Weight: 84.3 kg (185 lb 12.8 oz) IBW/kg (Calculated) : 45.5  Temp (24hrs), Avg:102.9 F (39.4 C), Min:102.9 F (39.4 C), Max:102.9 F (39.4 C)  Recent Labs  Lab 01/06/21 0554 01/07/21 0147 01/08/21 0111  WBC 5.7 5.0  --   CREATININE 0.80 0.69 0.75    Estimated Creatinine Clearance: 60.3 mL/min (by C-G formula based on SCr of 0.75 mg/dL).    Allergies  Allergen Reactions   Pentosan Polysulfate Sodium     Other reaction(s): Other (See Comments) ELMIRON Y Drug hair fell out 10/21/2009 12:00:00 AM by Johny Blamer CNA 1   Elmiron [Pentosan Polysulfate]    Naloxone Other (See Comments)    Hallucinations Confusion Nightmares    Antimicrobials this admission: Cefepime 12/20 >>  Vancomycin 12/20 >>   Dose adjustments this admission:   Microbiology results: 12/20 BCx >> 12/20 UCx >>  Thank you for allowing pharmacy to be a part of this patients care.  Vinnie Level, PharmD., BCPS, BCCCP Clinical Pharmacist Please refer to [facility] for unit-specific pharmacist

## 2021-01-12 NOTE — ED Provider Notes (Signed)
[facility] EMERGENCY DEPARTMENT Provider Note   CSN: [id number] Arrival date & time: 01/12/21  1826     History Chief Complaint  Patient presents with   Altered Mental Status    Jillian Leblanc is a 75 y.o. female brought to the ED due to [place] since 10 PM yesterday. She was recently admitted to the hospital from 11/25-12/16 2/2 septic shock due to E coli bacteremia L4-S1 osteomyelitis with epidural abscess. She underwent L4-L5 laminectomy and evacuation of abscess and is on IV ancef until 03/01/21 per ID. Per EMS report, the staff noticed the pt altered since 10 pm yesterday. She was having audio and visual hallucinations. She denies CP, SHOB, abdominal pain, or any pain. She is Alert and oriented to person, place, and time; she is unable to tell me why she is in the hospital.    Altered Mental Status Presenting symptoms: confusion   Associated symptoms: hallucinations   Associated symptoms: no headaches, no light-headedness and no weakness       Past Medical History:  Diagnosis Date   CHF (congestive heart failure) (HCC)    Chronic back pain    Diabetes mellitus without complication ([place])    Dysrhythmia    Hypothyroidism    Interstitial cystitis    Presence of permanent cardiac pacemaker    Thyroid disease     Patient Active Problem List   Diagnosis Date Noted   Sepsis ([place]) 01/12/2021   Discitis of lumbar region    Sepsis with acute renal failure without septic shock (HCC)    [place]    Pancytopenia (HCC)    Severe sepsis ([place]) 22/97/9892   Acute metabolic encephalopathy 11/94/1740   Acute hyponatremia 12/19/2020   Thrombocytopenia ([place]) 12/19/2020   Chronic anemia 12/19/2020   Chronic diastolic CHF (congestive heart failure) ([place]) 12/19/2020   E coli bacteremia 12/19/2020   Prolonged QT interval 12/04/2020   Chronic pain 12/04/2020   Fall    Compression of lumbar vertebra ([place])    Type 2 diabetes mellitus with hypoglycemia without coma ([place])     Hypothyroidism    Syncope 11/23/2020   AKI (acute kidney injury) ([place]) 11/22/2020   Multiple falls 09/14/2020   Pressure injury of skin 09/14/2020   Generalized weakness 09/14/2020   Hyponatremia 07/26/2020   Closed displaced fracture of coracoid process of right shoulder 07/26/2020   Diabetes mellitus type 2 in obese ([place]) 07/26/2020   Elevated troponin 07/26/2020   Recurrent falls 08/16/2019   Sinus arrest 08/15/2019   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   Cervicalgia of occipito-atlanto-axial region 06/26/2019   Head injury 06/26/2019   Frequent falls 06/26/2019    Past Surgical History:  Procedure Laterality Date   ABDOMINAL HYSTERECTOMY     CHOLECYSTECTOMY     NECK SURGERY     PACEMAKER IMPLANT N/A 08/16/2019   Procedure: PACEMAKER IMPLANT;  Surgeon: Constance Haw, MD;  Location: [facility];  Service: Cardiovascular;  Laterality: N/A;   REVERSE SHOULDER ARTHROPLASTY Right 07/28/2020   Procedure: REVERSE SHOULDER ARTHROPLASTY;  Surgeon: Justice Britain, MD;  Location: [facility];  Service: Orthopedics;  Laterality: Right;   SHOULDER SURGERY     THYROIDECTOMY       OB History   No obstetric history on file.     No family history on file.  Social History   Tobacco Use   Smoking status: Never   Smokeless tobacco: Never  Vaping Use   Vaping Use: Never used  Substance Use Topics  Alcohol use: No   Drug use: Never    Home Medications Prior to Admission medications   Medication Sig Start Date End Date Taking? Authorizing Provider  acetaminophen (TYLENOL) 500 MG tablet Take 2 tablets (1,000 mg total) by mouth every 8 (eight) hours as needed. Patient taking differently: Take 1,000 mg by mouth every 8 (eight) hours as needed (for pain- not to exceed 3-4 grams total from all combined sources/24 hours). 01/08/21  Yes Ghimire, Henreitta Leber, MD  calcium-vitamin D 250-100 MG-UNIT tablet Take 1 tablet by mouth 2 (two) times daily. 09/14/20  Yes Palumbo, April, MD   ceFAZolin (ANCEF) IVPB Inject 2 g into the vein every 8 (eight) hours. Indication: E.coli epidural abscess/osteo First Dose: Yes Last Day of Therapy:  03/01/21 Labs - Once weekly:  CBC/D and BMP, Labs - Every other week:  ESR and CRP Method of administration: IV Push Method of administration may be changed at the discretion of home infusion pharmacist based upon assessment of the patient and/or caregiver's ability to self-administer the medication ordered. 01/08/21 03/03/21 Yes Ghimire, Henreitta Leber, MD  furosemide (LASIX) 40 MG tablet Take 1 tablet (40 mg total) by mouth daily. 01/08/21  Yes Ghimire, Henreitta Leber, MD  gabapentin (NEURONTIN) 300 MG capsule Take 1 capsule (300 mg total) by mouth 2 (two) times daily. Patient taking differently: Take 300 mg by mouth in the morning and at bedtime. 12/08/20  Yes Nita Sells, MD  insulin aspart (NOVOLOG) 100 UNIT/ML injection 0-9 Units, Subcutaneous, 3 times daily with meals, Sensitive (thin, NPO, renal) CBG < 70: Implement Hypoglycemia measures CBG 70 - 120: 0 units CBG 121 - 150: 1 unit CBG 151 - 200: 2 units CBG 201 - 250: 3 units CBG 251 - 300: 5 units CBG 301 - 350: 7 units CBG 351 - 400: 9 units CBG > 400: call MD Patient taking differently: Inject 0-14 Units into the skin See admin instructions. Inject 0-14 units into the skin three times a day before meals and at bedtime, per "PEC sliding scale for resident's FSBS result: BGL <80 or >400, place in PEC problem book for f/u; 0-200 = give nothing; 201-250 = 2 units; 251-300 = 4 units; 301-350 = 6 units; 351-400 = 8 units; 401-450 = 10 units; 451-500 = 12 units; >500 = 14 units and re-check in 2 hrs/call MD if FSBS remains over 500." 01/08/21  Yes Ghimire, Henreitta Leber, MD  ipratropium-albuterol (DUONEB) 0.5-2.5 (3) MG/3ML SOLN Take 3 mLs by nebulization every 8 (eight) hours. 01/08/21  Yes Ghimire, Henreitta Leber, MD  levothyroxine (SYNTHROID) 150 MCG tablet Take 150 mcg by mouth daily before breakfast. 09/01/20   Yes [provider]  lidocaine (LIDODERM) 5 % Place 1 patch onto the skin daily. Remove & Discard patch within 12 hours or as directed by MD Patient taking differently: Place 1 patch onto the skin See admin instructions. Apply 1 patch topically daily and remove at bedtime- (Remove & Discard patch within 12 hours or as directed by MD) 01/09/21  Yes Ghimire, Henreitta Leber, MD  oxyCODONE (OXY IR/ROXICODONE) 5 MG immediate release tablet Take 1-2 tablets (5-10 mg total) by mouth every 6 (six) hours as needed for moderate pain or severe pain. 01/08/21  Yes Ghimire, Henreitta Leber, MD  OXYGEN Inhale 2 L/min into the lungs continuous.   Yes [provider]  pantoprazole (PROTONIX) 40 MG tablet Take 1 tablet (40 mg total) by mouth daily. Patient taking differently: Take 40 mg by mouth in the morning.  01/09/21  Yes Ghimire, Henreitta Leber, MD  polyethylene glycol (MIRALAX / GLYCOLAX) 17 g packet Take 17 g by mouth daily as needed. Patient taking differently: Take 17 g by mouth daily as needed for mild constipation. 01/08/21  Yes Ghimire, Henreitta Leber, MD  pramipexole (MIRAPEX) 1.5 MG tablet Take 1.5 mg by mouth 3 (three) times daily. 06/29/20  Yes [provider]  QUEtiapine (SEROQUEL) 25 MG tablet Take 1 tablet (25 mg total) by mouth at bedtime. 01/08/21  Yes Ghimire, Henreitta Leber, MD  sodium chloride 1 g tablet Take 1 tablet (1 g total) by mouth 2 (two) times daily with a meal. 01/08/21  Yes Ghimire, Henreitta Leber, MD    Allergies    Pentosan polysulfate sodium, Elmiron [pentosan polysulfate], and Naloxone  Review of Systems   Review of Systems  Constitutional: Negative.   HENT: Negative.    Eyes: Negative.   Respiratory: Negative.    Cardiovascular: Negative.   Gastrointestinal: Negative.   Genitourinary: Negative.   Musculoskeletal: Negative.   Skin:        Left arm wound that is not TTP  Neurological:  Negative for dizziness, syncope, speech difficulty, weakness, light-headedness, numbness  and headaches.  Psychiatric/Behavioral:  Positive for confusion and hallucinations. Negative for suicidal ideas.    Physical Exam Updated Vital Signs BP (!) 127/58    Pulse 91    Temp 99.3 F (37.4 C) (Oral)    Resp 18    Ht 5' (1.524 m)    Wt 84.3 kg    SpO2 100%    BMI 36.29 kg/m   Physical Exam Constitutional:      Appearance: Normal appearance.  HENT:     Head: Normocephalic and atraumatic.  Eyes:     Extraocular Movements: Extraocular movements intact.     Pupils: Pupils are equal, round, and reactive to light.  Cardiovascular:     Rate and Rhythm: Normal rate and regular rhythm.     Pulses: Normal pulses.     Heart sounds: Normal heart sounds.  Pulmonary:     Effort: Pulmonary effort is normal.     Breath sounds: Normal breath sounds.  Abdominal:     General: Abdomen is flat. Bowel sounds are normal.     Palpations: Abdomen is soft.  Musculoskeletal:        General: Normal range of motion.     Cervical back: Normal range of motion and neck supple.  Skin:    General: Skin is warm and dry.     Comments: Left forearm skin breakdown with clear discharge.    Neurological:     General: No focal deficit present.     Mental Status: She is alert. She is disoriented.    ED Results / Procedures / Treatments   Labs (all labs ordered are listed, but only abnormal results are displayed) Labs Reviewed  COMPREHENSIVE METABOLIC PANEL - Abnormal; Notable for the following components:      Result Value   Sodium 129 (*)    Potassium 2.3 (*)    Chloride 88 (*)    Glucose, Bld 131 (*)    Calcium 6.0 (*)    Total Protein 6.1 (*)    Albumin 2.1 (*)    Alkaline Phosphatase 189 (*)    All other components within normal limits  CBC WITH DIFFERENTIAL/PLATELET - Abnormal; Notable for the following components:   RBC 3.03 (*)    Hemoglobin 8.7 (*)    HCT 26.9 (*)    RDW  17.2 (*)    Abs Immature Granulocytes 0.11 (*)    All other components within normal limits  PROTIME-INR -  Abnormal; Notable for the following components:   Prothrombin Time 16.1 (*)    INR 1.3 (*)    All other components within normal limits  URINALYSIS, ROUTINE W REFLEX MICROSCOPIC - Abnormal; Notable for the following components:   APPearance HAZY (*)    Glucose, UA 50 (*)    Hgb urine dipstick SMALL (*)    Protein, ur 30 (*)    Nitrite POSITIVE (*)    Leukocytes,Ua LARGE (*)    WBC, UA >50 (*)    Bacteria, UA RARE (*)    All other components within normal limits  TROPONIN I (HIGH SENSITIVITY) - Abnormal; Notable for the following components:   Troponin I (High Sensitivity) 155 (*)    All other components within normal limits  RESP PANEL BY RT-PCR (FLU A&B, COVID) ARPGX2  CULTURE, BLOOD (ROUTINE X 2)  CULTURE, BLOOD (ROUTINE X 2)  URINE CULTURE  LACTIC ACID, PLASMA  LACTIC ACID, PLASMA  MAGNESIUM  TROPONIN I (HIGH SENSITIVITY)    EKG EKG Interpretation  Date/Time:  Tuesday January 12 2021 19:22:39 EST Ventricular Rate:  87 PR Interval:  136 QRS Duration: 97 QT Interval:  391 QTC Calculation: 471 R Axis:   -24 Text Interpretation: Sinus rhythm Borderline left axis deviation No significant change since last tracing Confirmed by Isla Pence [phone]) on 01/12/2021 7:41:50 PM  Radiology DG Chest 1 View  Result Date: 01/12/2021 CLINICAL DATA:  Altered mental status EXAM: CHEST  1 VIEW COMPARISON:  01/06/2021 FINDINGS: Lungs are clear. No pneumothorax or pleural effusion. Right upper extremity PICC line tip is seen within the superior cavoatrial junction. Cardiac size within normal limits. Left subclavian dual lead pacemaker is unchanged with leads overlying the right atrium and right ventricle. Pulmonary vascularity is normal. No acute bone abnormality. Right total shoulder arthroplasty has been performed. IMPRESSION: No active disease. Electronically Signed   By: Fidela Salisbury M.D.   On: 01/12/2021 20:36   DG Elbow Complete Left  Result Date: 01/12/2021 CLINICAL DATA:   Left elbow pain EXAM: LEFT ELBOW - COMPLETE 3+ VIEW COMPARISON:  None. FINDINGS: Normal alignment. No acute fracture or dislocation. Joint spaces appear preserved. No definite effusion though lateral positioning is suboptimal. Extensive diffuse soft tissue swelling is seen of the visualized left upper extremity. IMPRESSION: Diffuse soft tissue swelling.  No acute fracture or dislocation. Electronically Signed   By: Fidela Salisbury M.D.   On: 01/12/2021 20:37   DG Forearm Left  Result Date: 01/12/2021 CLINICAL DATA:  Left arm pain EXAM: LEFT FOREARM - 2 VIEW COMPARISON:  None. FINDINGS: No acute fracture or dislocation. Normal alignment. No osseous erosions or abnormal periosteal reaction. Diffuse subcutaneous edema is seen of the left forearm more severe along the dorsal aspect. IMPRESSION: Extensive soft tissue swelling.  No acute fracture or dislocation. Electronically Signed   By: Fidela Salisbury M.D.   On: 01/12/2021 20:40   DG Wrist Complete Left  Result Date: 01/12/2021 CLINICAL DATA:  Left wrist pain EXAM: LEFT WRIST - COMPLETE 3+ VIEW COMPARISON:  None. FINDINGS: Normal alignment. No acute fracture or dislocation. Joint spaces are preserved. Extensive surrounding soft tissue swelling is noted. Mild dystrophic calcification involving the TFCC versus remote nonunited ulnar styloid fracture. IMPRESSION: No acute fracture or dislocation.  Extensive soft tissue swelling. Electronically Signed   By: Fidela Salisbury M.D.   On: 01/12/2021 20:41  Procedures Procedures   Medications Ordered in ED Medications  lactated ringers infusion ( Intravenous New Bag/Given 01/12/21 2111)  vancomycin (VANCOREADY) IVPB 1500 mg/300 mL (1,500 mg Intravenous New Bag/Given 01/12/21 2115)  sodium chloride flush (NS) 0.9 % injection 10-40 mL (has no administration in time range)  Chlorhexidine Gluconate Cloth 2 % PADS 6 each (6 each Topical Not Given 01/12/21 2050)  potassium chloride SA (KLOR-CON M) CR tablet 40 mEq  (has no administration in time range)  calcium gluconate 2 g/ 100 mL sodium chloride IVPB (has no administration in time range)  ceFEPIme (MAXIPIME) 2 g in sodium chloride 0.9 % 100 mL IVPB (has no administration in time range)  vancomycin (VANCOCIN) IVPB 1000 mg/200 mL premix (has no administration in time range)  lactated ringers bolus 1,000 mL (0 mLs Intravenous Stopped 01/12/21 2029)  metroNIDAZOLE (FLAGYL) IVPB 500 mg (0 mg Intravenous Stopped 01/12/21 2050)  acetaminophen (TYLENOL) tablet 1,000 mg (1,000 mg Oral Given 01/12/21 1925)    ED Course  I have reviewed the triage vital signs and the nursing notes.  Pertinent labs & imaging results that were available during my care of the patient were reviewed by me and considered in my medical decision making (see chart for details).    MDM Rules/Calculators/A&P                          Broad spectrum abx Vanc and Cefepime started  CBC, CMP, trop, and UA collected. CBC without leukocytosis. Calcium and potassium repleted. Magnesium level drawn.  Lactic acid 1.8, second pending. Blood culture x 2 pending XR of left elbow, forearm, and wrist with diffuse soft tissue swelling.  CXR negative  for acute cardiopulmonary disease. Pt's status improving since arriving to ED Case discussed with [facility] who will be admitting the patient.      Final Clinical Impression(s) / ED Diagnoses Final diagnoses:  Disorientation  Urinary tract infection without hematuria, site unspecified    Rx / DC Orders ED Discharge Orders     None        Lajean Manes, MD 01/12/21 2139    Isla Pence, MD 01/12/21 2227

## 2021-01-12 NOTE — ED Triage Notes (Signed)
BIB [facility] from [facility]. Pt has had confusion x1 day. Pt started to have auditory and visual hallucinations. A&Ox2. Pt does have an open wound to the L lower arm. PICC in right arm, foley in place.   99% 2LNC baseline 140/80 110 heart rate CBG 138  20LAC

## 2021-01-12 NOTE — ED Notes (Signed)
[name], daughter, [phone] would like an update when available

## 2021-01-12 NOTE — Sepsis Progress Note (Signed)
Monitoring for the code sepsis protocol. °

## 2021-01-13 ENCOUNTER — Inpatient Hospital Stay (HOSPITAL_COMMUNITY): Payer: Medicare Other

## 2021-01-13 DIAGNOSIS — M7989 Other specified soft tissue disorders: Secondary | ICD-10-CM

## 2021-01-13 DIAGNOSIS — R338 Other retention of urine: Secondary | ICD-10-CM

## 2021-01-13 DIAGNOSIS — M79602 Pain in left arm: Secondary | ICD-10-CM

## 2021-01-13 DIAGNOSIS — A419 Sepsis, unspecified organism: Secondary | ICD-10-CM

## 2021-01-13 LAB — GLUCOSE, CAPILLARY
Glucose-Capillary: 113 mg/dL — ABNORMAL HIGH (ref 70–99)
Glucose-Capillary: 133 mg/dL — ABNORMAL HIGH (ref 70–99)
Glucose-Capillary: 128 mg/dL — ABNORMAL HIGH (ref 70–99)

## 2021-01-13 LAB — MAGNESIUM: Magnesium: 1.8 mg/dL (ref 1.7–2.4)

## 2021-01-13 LAB — BASIC METABOLIC PANEL
Anion gap: 9 (ref 5–15)
Anion gap: 11 (ref 5–15)
BUN: 11 mg/dL (ref 8–23)
BUN: 11 mg/dL (ref 8–23)
CO2: 28 mmol/L (ref 22–32)
CO2: 25 mmol/L (ref 22–32)
Calcium: 6.3 mg/dL — CL (ref 8.9–10.3)
Calcium: 6.5 mg/dL — ABNORMAL LOW (ref 8.9–10.3)
Chloride: 91 mmol/L — ABNORMAL LOW (ref 98–111)
Chloride: 92 mmol/L — ABNORMAL LOW (ref 98–111)
Creatinine, Ser: 0.73 mg/dL (ref 0.44–1.00)
Creatinine, Ser: 0.73 mg/dL (ref 0.44–1.00)
GFR, Estimated: >60 mL/min (ref >60)
GFR, Estimated: >60 mL/min (ref >60)
Glucose, Bld: 132 mg/dL — ABNORMAL HIGH (ref 70–99)
Glucose, Bld: 129 mg/dL — ABNORMAL HIGH (ref 70–99)
Potassium: 2.5 mmol/L — CL (ref 3.5–5.1)
Potassium: 3.4 mmol/L — ABNORMAL LOW (ref 3.5–5.1)
Sodium: 128 mmol/L — ABNORMAL LOW (ref 135–145)
Sodium: 128 mmol/L — ABNORMAL LOW (ref 135–145)

## 2021-01-13 LAB — CBG MONITORING, ED: Glucose-Capillary: 88 mg/dL (ref 70–99)

## 2021-01-13 LAB — PHOSPHORUS: Phosphorus: 3.2 mg/dL (ref 2.5–4.6)

## 2021-01-13 LAB — TROPONIN I (HIGH SENSITIVITY)
Troponin I (High Sensitivity): 153 ng/L (ref <18)
Troponin I (High Sensitivity): 145 ng/L (ref <18)

## 2021-01-13 MED ORDER — QUETIAPINE FUMARATE 25 MG PO TABS
25.0000 mg | ORAL_TABLET | Freq: Every day | ORAL | Status: DC
  Administered 2021-01-13 – 2021-01-21 (×10): 25 mg via ORAL
  Filled 2021-01-13 (×10): qty 1

## 2021-01-13 MED ORDER — CALCIUM GLUCONATE-NACL 2-0.675 GM/100ML-% IV SOLN
2.0000 g | Freq: Once | INTRAVENOUS | Status: AC
  Administered 2021-01-13: 2000 mg via INTRAVENOUS
  Filled 2021-01-13: qty 100

## 2021-01-13 MED ORDER — POLYETHYLENE GLYCOL 3350 17 G PO PACK: 17.0000 g | PACK | Freq: Every day | ORAL | Status: DC | PRN

## 2021-01-13 MED ORDER — SODIUM CHLORIDE 1 G PO TABS
1.0000 g | ORAL_TABLET | Freq: Two times a day (BID) | ORAL | Status: DC
  Administered 2021-01-13 (×2): 1 g via ORAL
  Filled 2021-01-13 (×2): qty 1

## 2021-01-13 MED ORDER — OXYCODONE HCL 5 MG PO TABS
5.0000 mg | ORAL_TABLET | ORAL | Status: DC | PRN
  Administered 2021-01-13 – 2021-01-22 (×19): 5 mg via ORAL
  Filled 2021-01-13 (×23): qty 1

## 2021-01-13 MED ORDER — TRAZODONE HCL 50 MG PO TABS
50.0000 mg | ORAL_TABLET | Freq: Every evening | ORAL | Status: DC | PRN
  Administered 2021-01-13 – 2021-01-19 (×7): 50 mg via ORAL
  Filled 2021-01-13 (×7): qty 1

## 2021-01-13 MED ORDER — POTASSIUM CHLORIDE CRYS ER 20 MEQ PO TBCR
40.0000 meq | EXTENDED_RELEASE_TABLET | Freq: Three times a day (TID) | ORAL | Status: AC
  Administered 2021-01-13 (×3): 40 meq via ORAL
  Filled 2021-01-13 (×3): qty 2

## 2021-01-13 MED ORDER — CALCIUM CARBONATE 1250 (500 CA) MG PO TABS: 500.0000 mg | ORAL_TABLET | Freq: Two times a day (BID) | ORAL | Status: DC

## 2021-01-13 MED ORDER — HYDRALAZINE HCL 20 MG/ML IJ SOLN: 10.0000 mg | INTRAMUSCULAR | Status: DC | PRN

## 2021-01-13 MED ORDER — PRAMIPEXOLE DIHYDROCHLORIDE 1.5 MG PO TABS
1.5000 mg | ORAL_TABLET | Freq: Three times a day (TID) | ORAL | Status: DC
  Administered 2021-01-13 – 2021-01-22 (×28): 1.5 mg via ORAL
  Filled 2021-01-13 (×30): qty 1

## 2021-01-13 MED ORDER — POTASSIUM CHLORIDE 10 MEQ/100ML IV SOLN
10.0000 meq | INTRAVENOUS | Status: AC
  Administered 2021-01-13 (×4): 10 meq via INTRAVENOUS
  Filled 2021-01-13 (×4): qty 100

## 2021-01-13 MED ORDER — LACTATED RINGERS IV SOLN: INTRAVENOUS | Status: DC

## 2021-01-13 MED ORDER — GABAPENTIN 300 MG PO CAPS
300.0000 mg | ORAL_CAPSULE | Freq: Two times a day (BID) | ORAL | Status: DC
  Administered 2021-01-13 – 2021-01-22 (×20): 300 mg via ORAL
  Filled 2021-01-13 (×20): qty 1

## 2021-01-13 MED ORDER — INSULIN ASPART 100 UNIT/ML IJ SOLN
0.0000 [IU] | Freq: Three times a day (TID) | INTRAMUSCULAR | Status: DC
  Administered 2021-01-13 – 2021-01-16 (×4): 1 [IU] via SUBCUTANEOUS
  Administered 2021-01-17: 3 [IU] via SUBCUTANEOUS
  Administered 2021-01-18: 2 [IU] via SUBCUTANEOUS
  Administered 2021-01-19 – 2021-01-20 (×3): 1 [IU] via SUBCUTANEOUS
  Administered 2021-01-20: 3 [IU] via SUBCUTANEOUS
  Administered 2021-01-21: 1 [IU] via SUBCUTANEOUS
  Administered 2021-01-21 – 2021-01-22 (×2): 2 [IU] via SUBCUTANEOUS

## 2021-01-13 MED ORDER — K PHOS MONO-SOD PHOS DI & MONO 155-852-130 MG PO TABS
500.0000 mg | ORAL_TABLET | Freq: Three times a day (TID) | ORAL | Status: AC
  Administered 2021-01-13 (×3): 500 mg via ORAL
  Filled 2021-01-13 (×3): qty 2

## 2021-01-13 MED ORDER — PANTOPRAZOLE SODIUM 40 MG PO TBEC
40.0000 mg | DELAYED_RELEASE_TABLET | Freq: Every day | ORAL | Status: DC
  Administered 2021-01-13 – 2021-01-22 (×10): 40 mg via ORAL
  Filled 2021-01-13 (×10): qty 1

## 2021-01-13 MED ORDER — IPRATROPIUM-ALBUTEROL 0.5-2.5 (3) MG/3ML IN SOLN: 3.0000 mL | RESPIRATORY_TRACT | Status: DC | PRN

## 2021-01-13 MED ORDER — OYSTER SHELL CALCIUM/D3 500-5 MG-MCG PO TABS
1.0000 | ORAL_TABLET | Freq: Two times a day (BID) | ORAL | Status: DC
Start: 2021-01-13 — End: 2021-01-22
  Administered 2021-01-13 – 2021-01-22 (×19): 1 via ORAL
  Filled 2021-01-13 (×19): qty 1

## 2021-01-13 MED ORDER — SENNOSIDES-DOCUSATE SODIUM 8.6-50 MG PO TABS: 1.0000 | ORAL_TABLET | Freq: Every evening | ORAL | Status: DC | PRN

## 2021-01-13 MED ORDER — DM-GUAIFENESIN ER 30-600 MG PO TB12
1.0000 | ORAL_TABLET | Freq: Two times a day (BID) | ORAL | Status: DC | PRN
  Filled 2021-01-13: qty 1

## 2021-01-13 MED ORDER — METOPROLOL TARTRATE 5 MG/5ML IV SOLN: 5.0000 mg | INTRAVENOUS | Status: DC | PRN

## 2021-01-13 MED ORDER — FUROSEMIDE 20 MG PO TABS: 40.0000 mg | ORAL_TABLET | Freq: Every day | ORAL | Status: DC

## 2021-01-13 MED ORDER — LEVOTHYROXINE SODIUM 75 MCG PO TABS
150.0000 ug | ORAL_TABLET | Freq: Every day | ORAL | Status: DC
  Administered 2021-01-13 – 2021-01-22 (×10): 150 ug via ORAL
  Filled 2021-01-13 (×10): qty 2

## 2021-01-13 MED ORDER — ACETAMINOPHEN 325 MG PO TABS
650.0000 mg | ORAL_TABLET | Freq: Four times a day (QID) | ORAL | Status: DC | PRN
  Administered 2021-01-15 – 2021-01-22 (×3): 650 mg via ORAL
  Filled 2021-01-13 (×3): qty 2

## 2021-01-13 NOTE — ED Notes (Signed)
Pt daughter [name] still awaiting an update [phone]

## 2021-01-13 NOTE — ED Notes (Signed)
[name], daughter, [phone] would like an update, states she has called twice and got no phone call.

## 2021-01-13 NOTE — Plan of Care (Signed)
Arrived from ED via stretcher with staff and family. Alert and oriented x 4.  Denies any pain or discomfort.  Will continue to monitor.

## 2021-01-13 NOTE — ED Notes (Signed)
Pt incont of stool. Cleaned up and foley care performed.

## 2021-01-13 NOTE — Progress Notes (Signed)
°   01/13/21 1626  Vitals  Temp 100 F (37.8 C)  Temp Source Oral  BP (!) 119/91  MAP (mmHg) 101  BP Location Left Arm  BP Method Automatic  Patient Position (if appropriate) Lying  Pulse Rate (!) 104  Pulse Rate Source Dinamap  Resp (!) 24  MEWS COLOR  MEWS Score Color Yellow  Oxygen Therapy  SpO2 96 %  MEWS Score  MEWS Temp 0  MEWS Systolic 0  MEWS Pulse 1  MEWS RR 1  MEWS LOC 0  MEWS Score 2   Patient appears restless and agitated at time at this time.  Will reevaluate once she is more relaxed

## 2021-01-13 NOTE — ED Notes (Signed)
Cleaned pt's wound on back with NS and applied a dry dressing.

## 2021-01-13 NOTE — ED Notes (Signed)
Pt was incontinent of BM, cleaned pt and applied a clean brief. 

## 2021-01-13 NOTE — Progress Notes (Signed)
PROGRESS NOTE    Jillian Leblanc  [id number] DOB: May 26, 1945 DOA: 01/12/2021 PCP: Guadlupe Spanish, MD   Brief Narrative:  75 year old with history of DM2, sinus pause status post pacemaker, CHF with preserved EF, hypothyroidism admitted for altered mental status.  Patient was discharged 5 days ago after being treated for E. coli bacteremia, lumbar osteomyelitis with epidural abscess underwent laminectomy with evacuation of abscess.  Discharged on IV Ancef until 03/01/2021 by ID.  Also had urinary retention therefore was discharged on Foley catheter.  At SNF patient became confused having auditory and visual hallucination therefore brought to the hospital and was found to have sepsis secondary to UTI/CAUTI and multiple electrolyte imbalance.   Assessment & Plan:   Principal Problem:   Sepsis ([facility]) Active Problems:   Hyponatremia   Hypokalemia   Elevated troponin   Hypothyroidism   Acute metabolic encephalopathy   Chronic diastolic CHF (congestive heart failure) (H[facility])   Hypocalcemia   Hypomagnesemia   Sepsis secondary to UTI ([facility])   Acute urinary retention   Urosepsis secondary to indwelling catheter; CAUTI -Follow-up culture data.  Empiric antibiotics- Cefepime -Fluids as necessary.  Exchange Foley catheter   Acute metabolic encephalopathy secondary to UTI w/indwelling catheter  -Secondary to underlying infection.  Should improve   Elevated troponin  -Demand ischemia, remained flat.   Hyponatremia -Likely from volume depletion.  Continue IV fluids.   Hypokalemia/hypomagnesemia/hypocalcemia -Aggressive repletion   Left UE edema  -Venous Dopplers-   History of recent E.coli bacteremia epidural abscess and laminectomy -Status post L4-L5 laminectomy with evacuation of epidural abscess on 12/12 with neurosurgery  - No active infection to healing surgical wound - Patient was placed on IV Ancef until 03/01/2021 by ID.Currently on IV cefepime for UTI.    History of urinary  retention -Foley catheter was placed prior to her discharge on 12/15.  We will request nursing staff to exchange this.-Currently holding Lasix, getting gentle hydration.   Chronic diastolic heart failure Appears euvolemic on exam.  Hold Lasix overnight while receiving IV fluids.  Will resume tomorrow.   Type 2 diabetes Last hemoglobin A1c of 8.4 in October -Sliding scale Accu-Cheks   Hypothyroidism Continue levothyroxine      DVT prophylaxis: Lovenox  Code Status: Full code Family Communication: Daughter updated by me  Status is: Inpatient  Remains inpatient appropriate because: Maintain hospital stay for IV antibiotic, aggressive electrolyte repletion.  Awaiting culture data.       Nutritional status           Body mass index is 36.29 kg/m.  Pressure Injury 12/19/20 Sacrum Medial Stage 2 -  Partial thickness loss of dermis presenting as a shallow open injury with a red, pink wound bed without slough. stage 2  sacrum  4cm x .5cm (Active)  12/19/20 1640  Location: Sacrum  Location Orientation: Medial  Staging: Stage 2 -  Partial thickness loss of dermis presenting as a shallow open injury with a red, pink wound bed without slough.  Wound Description (Comments): stage 2  sacrum  4cm x .5cm  Present on Admission: Yes          Subjective: Feeling better after receiving fluids and IV antibiotics.  Still feels overall very weak.  Patient's daughter tells me that the rehab facility had been restricting her water intake.  Review of Systems Otherwise negative except as per HPI, including: General: Denies fever, chills, night sweats or unintended weight loss. Resp: Denies cough, wheezing, shortness of breath. Cardiac: Denies chest pain, palpitations,  orthopnea, paroxysmal nocturnal dyspnea. GI: Denies abdominal pain, nausea, vomiting, diarrhea or constipation GU: Denies dysuria, frequency, hesitancy or incontinence MS: Denies muscle aches, joint pain or  swelling Neuro: Denies headache, neurologic deficits (focal weakness, numbness, tingling), abnormal gait Psych: Denies anxiety, depression, SI/HI/AVH Skin: Denies new rashes or lesions ID: Denies sick contacts, exotic exposures, travel  Examination:  General exam: Appears calm and comfortable  Respiratory system: Clear to auscultation. Respiratory effort normal. Cardiovascular system: S1 & S2 heard, RRR. No JVD, murmurs, rubs, gallops or clicks. No pedal edema. Gastrointestinal system: Abdomen is nondistended, soft and nontender. No organomegaly or masses felt. Normal bowel sounds heard. Central nervous system: Alert and oriented. No focal neurological deficits. Extremities: Symmetric 5 x 5 power. Skin: No rashes, lesions or ulcers Psychiatry: Judgement and insight appear normal. Mood & affect appropriate.     Objective: Vitals:   01/13/21 0330 01/13/21 0540 01/13/21 0630 01/13/21 0722  BP: 108/72  (!) 145/123 (!) 131/104  Pulse: 82 76 76 84  Resp: 17 17 13 17   Temp:      TempSrc:      SpO2: 100% 100% 100% 100%  Weight:      Height:        Intake/Output Summary (Last 24 hours) at 01/13/2021 0741 Last data filed at 01/13/2021 0710 Gross per 24 hour  Intake 1100 ml  Output 1200 ml  Net -100 ml   Filed Weights   01/12/21 1838  Weight: 84.3 kg     Data Reviewed:   CBC: Recent Labs  Lab 01/07/21 0147 01/12/21 1907  WBC 5.0 7.6  NEUTROABS  --  5.0  HGB 9.1* 8.7*  HCT 28.1* 26.9*  MCV 90.4 88.8  PLT 187 [id number]   Basic Metabolic Panel: Recent Labs  Lab 01/07/21 0147 01/08/21 0111 01/12/21 1907 01/12/21 2107 01/13/21 0036 01/13/21 0459  NA 131* 130* 129*  --  128*  --   K 3.2* 3.6 2.3*  --  2.5*  --   CL 94* 96* 88*  --  91*  --   CO2 29 27 30   --  28  --   GLUCOSE 123* 91 131*  --  132*  --   BUN 8 6* 11  --  11  --   CREATININE 0.69 0.75 0.89  --  0.73  --   CALCIUM 7.2* 7.1* 6.0*  --  6.3*  --   MG  --   --   --  1.1*  --  1.8  PHOS 3.5  --   --    --   --   --    GFR: Estimated Creatinine Clearance: 60.3 mL/min (by C-G formula based on SCr of 0.73 mg/dL). Liver Function Tests: Recent Labs  Lab 01/06/21 0951 01/07/21 0147 01/12/21 1907  AST 22  --  23  ALT <5  --  6  ALKPHOS 147*  --  189*  BILITOT 2.6*  --  1.1  PROT 6.2*  --  6.1*  ALBUMIN 2.7* 2.4* 2.1*   No results for input(s): LIPASE, AMYLASE in the last 168 hours. Recent Labs  Lab 01/06/21 0951  AMMONIA 22   Coagulation Profile: Recent Labs  Lab 01/12/21 1907  INR 1.3*   Cardiac Enzymes: No results for input(s): CKTOTAL, CKMB, CKMBINDEX, TROPONINI in the last 168 hours. BNP (last 3 results) No results for input(s): PROBNP in the last 8760 hours. HbA1C: No results for input(s): HGBA1C in the last 72 hours. CBG: Recent Labs  Lab  01/08/21 0421 01/08/21 0748 01/08/21 1206 01/08/21 1542 01/08/21 1958  GLUCAP 104* 79 124* 134* 116*   Lipid Profile: No results for input(s): CHOL, HDL, LDLCALC, TRIG, CHOLHDL, LDLDIRECT in the last 72 hours. Thyroid Function Tests: No results for input(s): TSH, T4TOTAL, FREET4, T3FREE, THYROIDAB in the last 72 hours. Anemia Panel: No results for input(s): VITAMINB12, FOLATE, FERRITIN, TIBC, IRON, RETICCTPCT in the last 72 hours. Sepsis Labs: Recent Labs  Lab 01/12/21 1902 01/12/21 2107  LATICACIDVEN 1.8 1.2    Recent Results (from the past 240 hour(s))  Surgical pcr screen     Status: Abnormal   Collection Time: 01/04/21  1:07 PM   Specimen: Nasal Mucosa; Nasal Swab  Result Value Ref Range Status   MRSA, PCR POSITIVE (A) NEGATIVE Final    Comment: RESULT CALLED TO, READ BACK BY AND VERIFIED WITH: RM C GANOE ES:[id number] AT 1443 BY CM    Staphylococcus aureus POSITIVE (A) NEGATIVE Final    Comment: (NOTE) The Xpert SA Assay (FDA approved for NASAL specimens in patients 24 years of age and older), is one component of a comprehensive surveillance program. It is not intended to diagnose infection nor to guide or  monitor treatment. Performed at [facility], [place] [address] [place], [place] [address]   Aerobic/Anaerobic Culture w Gram Stain (surgical/deep wound)     Status: None   Collection Time: 01/04/21  6:48 PM   Specimen: PATH Other; Tissue  Result Value Ref Range Status   Specimen Description ABSCESS  Final   Special Requests EPIDURAL PT ON ANCEF  Final   Gram Stain NO WBC SEEN NO ORGANISMS SEEN   Final   Culture   Final    RARE ESCHERICHIA COLI NO ANAEROBES ISOLATED Performed at [facility], [place] [address] [place], [place] [address]    Report Status 01/10/2021 FINAL  Final   Organism ID, Bacteria ESCHERICHIA COLI  Final      Susceptibility   Escherichia coli - MIC*    AMPICILLIN 8 SENSITIVE Sensitive     CEFAZOLIN <=4 SENSITIVE Sensitive     CEFEPIME <=0.12 SENSITIVE Sensitive     CEFTAZIDIME <=1 SENSITIVE Sensitive     CEFTRIAXONE <=0.25 SENSITIVE Sensitive     CIPROFLOXACIN <=0.25 SENSITIVE Sensitive     GENTAMICIN <=1 SENSITIVE Sensitive     IMIPENEM <=0.25 SENSITIVE Sensitive     TRIMETH/SULFA <=20 SENSITIVE Sensitive     AMPICILLIN/SULBACTAM <=2 SENSITIVE Sensitive     PIP/TAZO <=4 SENSITIVE Sensitive     * RARE ESCHERICHIA COLI  Culture, blood (routine x 2)     Status: None   Collection Time: 01/06/21  8:46 AM   Specimen: BLOOD LEFT HAND  Result Value Ref Range Status   Specimen Description BLOOD LEFT HAND  Final   Special Requests   Final    BOTTLES DRAWN AEROBIC ONLY Blood Culture adequate volume   Culture   Final    NO GROWTH 5 DAYS Performed at [facility], [address] [place], [place] [address]    Report Status 01/11/2021 FINAL  Final  Culture, blood (routine x 2)     Status: None   Collection Time: 01/06/21  8:47 AM   Specimen: BLOOD RIGHT HAND  Result Value Ref Range Status   Specimen Description BLOOD RIGHT HAND  Final   Special Requests   Final    BOTTLES DRAWN AEROBIC ONLY Blood Culture adequate volume   Culture   Final  NO GROWTH 5 DAYS Performed at [facility], [place] [address] [place], [place] [address]    Report Status 01/11/2021 FINAL  Final  Urine Culture     Status: Abnormal   Collection Time: 01/06/21  2:24 PM   Specimen: Urine, Catheterized  Result Value Ref Range Status   Specimen Description URINE, CATHETERIZED  Final   Special Requests   Final    NONE Performed at [facility], [place] [address], [place], [place] [address]    Culture >=100,000 COLONIES/mL PSEUDOMONAS AERUGINOSA (A)  Final   Report Status 01/08/2021 FINAL  Final   Organism ID, Bacteria PSEUDOMONAS AERUGINOSA (A)  Final      Susceptibility   Pseudomonas aeruginosa - MIC*    CEFTAZIDIME 4 SENSITIVE Sensitive     CIPROFLOXACIN <=0.25 SENSITIVE Sensitive     GENTAMICIN <=1 SENSITIVE Sensitive     IMIPENEM 2 SENSITIVE Sensitive     PIP/TAZO 8 SENSITIVE Sensitive     CEFEPIME 2 SENSITIVE Sensitive     * >=100,000 COLONIES/mL PSEUDOMONAS AERUGINOSA  Resp Panel by RT-PCR (Flu A&B, Covid) Nasopharyngeal Swab     Status: None   Collection Time: 01/07/21  1:48 PM   Specimen: Nasopharyngeal Swab; Nasopharyngeal(NP) swabs in vial transport medium  Result Value Ref Range Status   SARS Coronavirus 2 by RT PCR NEGATIVE NEGATIVE Final    Comment: (NOTE) SARS-CoV-2 target nucleic acids are NOT DETECTED.  The SARS-CoV-2 RNA is generally detectable in upper respiratory specimens during the acute phase of infection. The lowest concentration of SARS-CoV-2 viral copies this assay can detect is 138 copies/mL. A negative result does not preclude SARS-Cov-2 infection and should not be used as the sole basis for treatment or other patient management decisions. A negative result may occur with  improper specimen collection/handling, submission of specimen other than nasopharyngeal swab, presence of viral mutation(s) within the areas targeted by this assay, and inadequate number of viral copies(<138 copies/mL). A negative result must  be combined with clinical observations, patient history, and epidemiological information. The expected result is Negative.  Fact Sheet for Patients:  EntrepreneurPulse.com.au  Fact Sheet for Healthcare Providers:  IncredibleEmployment.be  This test is no t yet approved or cleared by the [place] FDA and  has been authorized for detection and/or diagnosis of SARS-CoV-2 by FDA under an Emergency Use Authorization (EUA). This EUA will remain  in effect (meaning this test can be used) for the duration of the COVID-19 declaration under Section 564(b)(1) of the Act, 21 U.S.C.section 360bbb-3(b)(1), unless the authorization is terminated  or revoked sooner.       Influenza A by PCR NEGATIVE NEGATIVE Final   Influenza B by PCR NEGATIVE NEGATIVE Final    Comment: (NOTE) The Xpert Xpress SARS-CoV-2/FLU/RSV plus assay is intended as an aid in the diagnosis of influenza from Nasopharyngeal swab specimens and should not be used as a sole basis for treatment. Nasal washings and aspirates are unacceptable for Xpert Xpress SARS-CoV-2/FLU/RSV testing.  Fact Sheet for Patients: EntrepreneurPulse.com.au  Fact Sheet for Healthcare Providers: IncredibleEmployment.be  This test is not yet approved or cleared by the [place] FDA and has been authorized for detection and/or diagnosis of SARS-CoV-2 by FDA under an Emergency Use Authorization (EUA). This EUA will remain in effect (meaning this test can be used) for the duration of the COVID-19 declaration under Section 564(b)(1) of the Act, 21 U.S.C. section 360bbb-3(b)(1), unless the authorization is terminated or revoked.  Performed at [facility], [address] [place], [place] [address]   Resp Panel by RT-PCR (Flu A&B, Covid)     Status: None   Collection Time: 01/12/21  7:07 PM   Specimen: Nasopharyngeal(NP) swabs in vial transport medium  Result  Value Ref Range Status   SARS Coronavirus 2 by RT PCR NEGATIVE NEGATIVE Final    Comment: (NOTE) SARS-CoV-2 target nucleic acids are NOT DETECTED.  The SARS-CoV-2 RNA is generally detectable in upper respiratory specimens during the acute phase of infection. The lowest concentration of SARS-CoV-2 viral copies this assay can detect is 138 copies/mL. A negative result does not preclude SARS-Cov-2 infection and should not be used as the sole basis for treatment or other patient management decisions. A negative result may occur with  improper specimen collection/handling, submission of specimen other than nasopharyngeal swab, presence of viral mutation(s) within the areas targeted by this assay, and inadequate number of viral copies(<138 copies/mL). A negative result must be combined with clinical observations, patient history, and epidemiological information. The expected result is Negative.  Fact Sheet for Patients:  EntrepreneurPulse.com.au  Fact Sheet for Healthcare Providers:  IncredibleEmployment.be  This test is no t yet approved or cleared by the [place] FDA and  has been authorized for detection and/or diagnosis of SARS-CoV-2 by FDA under an Emergency Use Authorization (EUA). This EUA will remain  in effect (meaning this test can be used) for the duration of the COVID-19 declaration under Section 564(b)(1) of the Act, 21 U.S.C.section 360bbb-3(b)(1), unless the authorization is terminated  or revoked sooner.       Influenza A by PCR NEGATIVE NEGATIVE Final   Influenza B by PCR NEGATIVE NEGATIVE Final    Comment: (NOTE) The Xpert Xpress SARS-CoV-2/FLU/RSV plus assay is intended as an aid in the diagnosis of influenza from Nasopharyngeal swab specimens and should not be used as a sole basis for treatment. Nasal washings and aspirates are unacceptable for Xpert Xpress SARS-CoV-2/FLU/RSV testing.  Fact Sheet for  Patients: EntrepreneurPulse.com.au  Fact Sheet for Healthcare Providers: IncredibleEmployment.be  This test is not yet approved or cleared by the [place] FDA and has been authorized for detection and/or diagnosis of SARS-CoV-2 by FDA under an Emergency Use Authorization (EUA). This EUA will remain in effect (meaning this test can be used) for the duration of the COVID-19 declaration under Section 564(b)(1) of the Act, 21 U.S.C. section 360bbb-3(b)(1), unless the authorization is terminated or revoked.  Performed at [facility], [place] [address] [place], [place] [address]          Radiology Studies: DG Chest 1 View  Result Date: 01/12/2021 CLINICAL DATA:  Altered mental status EXAM: CHEST  1 VIEW COMPARISON:  01/06/2021 FINDINGS: Lungs are clear. No pneumothorax or pleural effusion. Right upper extremity PICC line tip is seen within the superior cavoatrial junction. Cardiac size within normal limits. Left subclavian dual lead pacemaker is unchanged with leads overlying the right atrium and right ventricle. Pulmonary vascularity is normal. No acute bone abnormality. Right total shoulder arthroplasty has been performed. IMPRESSION: No active disease. Electronically Signed   By: Fidela Salisbury M.D.   On: 01/12/2021 20:36   DG Elbow Complete Left  Result Date: 01/12/2021 CLINICAL DATA:  Left elbow pain EXAM: LEFT ELBOW - COMPLETE 3+ VIEW COMPARISON:  None. FINDINGS: Normal alignment. No acute fracture or dislocation. Joint spaces appear preserved. No definite effusion though lateral positioning is suboptimal. Extensive diffuse soft tissue swelling is seen of the visualized left upper extremity. IMPRESSION:  Diffuse soft tissue swelling.  No acute fracture or dislocation. Electronically Signed   By: Helyn Numbers M.D.   On: 01/12/2021 20:37   DG Forearm Left  Result Date: 01/12/2021 CLINICAL DATA:  Left arm pain EXAM: LEFT FOREARM - 2 VIEW  COMPARISON:  None. FINDINGS: No acute fracture or dislocation. Normal alignment. No osseous erosions or abnormal periosteal reaction. Diffuse subcutaneous edema is seen of the left forearm more severe along the dorsal aspect. IMPRESSION: Extensive soft tissue swelling.  No acute fracture or dislocation. Electronically Signed   By: Helyn Numbers M.D.   On: 01/12/2021 20:40   DG Wrist Complete Left  Result Date: 01/12/2021 CLINICAL DATA:  Left wrist pain EXAM: LEFT WRIST - COMPLETE 3+ VIEW COMPARISON:  None. FINDINGS: Normal alignment. No acute fracture or dislocation. Joint spaces are preserved. Extensive surrounding soft tissue swelling is noted. Mild dystrophic calcification involving the TFCC versus remote nonunited ulnar styloid fracture. IMPRESSION: No acute fracture or dislocation.  Extensive soft tissue swelling. Electronically Signed   By: Helyn Numbers M.D.   On: 01/12/2021 20:41        Scheduled Meds:  calcium-vitamin D  1 tablet Oral BID WC   Chlorhexidine Gluconate Cloth  6 each Topical Daily   enoxaparin (LOVENOX) injection  40 mg Subcutaneous Q24H   [START ON 01/14/2021] furosemide  40 mg Oral Daily   gabapentin  300 mg Oral BID   insulin aspart  0-9 Units Subcutaneous TID WC   levothyroxine  150 mcg Oral QAC breakfast   pantoprazole  40 mg Oral Daily   pramipexole  1.5 mg Oral TID   QUEtiapine  25 mg Oral QHS   sodium chloride flush  10-40 mL Intracatheter Q12H   sodium chloride  1 g Oral BID WC   Continuous Infusions:  ceFEPime (MAXIPIME) IV 2 g (01/13/21 0720)   lactated ringers 75 mL/hr at 01/13/21 0041     LOS: 1 day   Time spent= 35 mins    Haylie Mccutcheon Joline Maxcy, MD [facility]  If 7PM-7AM, please contact night-coverage  01/13/2021, 7:41 AM

## 2021-01-13 NOTE — H&P (Signed)
History and Physical    Jillian Leblanc DPO:[id number] DOB: May 20, 1945 DOA: 01/12/2021  PCP: Guadlupe Spanish, MD  Patient coming from: [facility]  I have personally briefly reviewed patient's old medical records in [place]  Chief Complaint: AMS  HPI: Jillian Leblanc is a 75 y.o. female with medical history significant for type 2 diabetes, sinus pause s/p PPM, HFpEF, hypothyroidism who presents from SNF with concerns of altered mental status.  Patient recently admitted and discharged 5 days ago.  She had septic shock from E. coli bacteremia L4-S1 osteomyelitis with epidural abscess.  She underwent L4-L5 laminectomy with evacuation of epidural abscess on 12/12 with neurosurgery and was placed on IV Ancef until 03/01/2021 by ID. Also had acute urinary retention with AKI was discharged on Foley.  Had anemia requiring 1 unit of PRBC transfusion on 12/13.  She was sent over by SNF today for reportedly 1 day of confusion.  Had auditory and visual hallucination.  Patient alert and oriented to self and place at my evaluation however has no recollection of events today.  She does recall being recently admitted for an infection in her back.  Denies any new back pain.   ED Course: She was febrile up to 102.9, normotensive and placed on 2 L. No leukocytosis, hemoglobin of 8.7 which is baseline. Sodium of 129, K of 2.3, creatinine of 0.89, BG of 131. Calcium of 6, albumin of 2.1  Troponin of 155.  EKG with normal sinus rhythm and no ST or T wave changes.  UA was positive for nitrite, large leukocyte, greater than 50 WBC and rare bacteria  Review of Systems: Pertinent positives and negatives as above given patient has altered mental status Past Medical History:  Diagnosis Date   CHF (congestive heart failure) (HCC)    Chronic back pain    Diabetes mellitus without complication ([place])    Dysrhythmia    Hypothyroidism    Interstitial cystitis    Presence of permanent cardiac  pacemaker    Thyroid disease     Past Surgical History:  Procedure Laterality Date   ABDOMINAL HYSTERECTOMY     CHOLECYSTECTOMY     NECK SURGERY     PACEMAKER IMPLANT N/A 08/16/2019   Procedure: PACEMAKER IMPLANT;  Surgeon: Constance Haw, MD;  Location: [facility];  Service: Cardiovascular;  Laterality: N/A;   REVERSE SHOULDER ARTHROPLASTY Right 07/28/2020   Procedure: REVERSE SHOULDER ARTHROPLASTY;  Surgeon: Justice Britain, MD;  Location: [facility];  Service: Orthopedics;  Laterality: Right;   SHOULDER SURGERY     THYROIDECTOMY       reports that she has never smoked. She has never used smokeless tobacco. She reports that she does not drink alcohol and does not use drugs. Social History  Allergies  Allergen Reactions   Pentosan Polysulfate Sodium Other (See Comments)    "ELMIRON Y Drug hair fell out 10/21/2009 12:00:00 AM by [name] CNA 1"   Elmiron [Pentosan Polysulfate] Other (See Comments)    "Hair fell out"   Naloxone Other (See Comments)    Hallucinations/Confusion/Nightmares    No family history on file.   Prior to Admission medications   Medication Sig Start Date End Date Taking? Authorizing Provider  acetaminophen (TYLENOL) 500 MG tablet Take 2 tablets (1,000 mg total) by mouth every 8 (eight) hours as needed. Patient taking differently: Take 1,000 mg by mouth every 8 (eight) hours as needed (for pain- not to exceed 3-4 grams total from all combined sources/24 hours).  01/08/21  Yes Ghimire, Henreitta Leber, MD  calcium-vitamin D 250-100 MG-UNIT tablet Take 1 tablet by mouth 2 (two) times daily. 09/14/20  Yes Palumbo, April, MD  ceFAZolin (ANCEF) IVPB Inject 2 g into the vein every 8 (eight) hours. Indication: E.coli epidural abscess/osteo First Dose: Yes Last Day of Therapy:  03/01/21 Labs - Once weekly:  CBC/D and BMP, Labs - Every other week:  ESR and CRP Method of administration: IV Push Method of administration may be changed at the discretion of home  infusion pharmacist based upon assessment of the patient and/or caregiver's ability to self-administer the medication ordered. 01/08/21 03/03/21 Yes Ghimire, Henreitta Leber, MD  furosemide (LASIX) 40 MG tablet Take 1 tablet (40 mg total) by mouth daily. 01/08/21  Yes Ghimire, Henreitta Leber, MD  gabapentin (NEURONTIN) 300 MG capsule Take 1 capsule (300 mg total) by mouth 2 (two) times daily. Patient taking differently: Take 300 mg by mouth in the morning and at bedtime. 12/08/20  Yes Nita Sells, MD  insulin aspart (NOVOLOG) 100 UNIT/ML injection 0-9 Units, Subcutaneous, 3 times daily with meals, Sensitive (thin, NPO, renal) CBG < 70: Implement Hypoglycemia measures CBG 70 - 120: 0 units CBG 121 - 150: 1 unit CBG 151 - 200: 2 units CBG 201 - 250: 3 units CBG 251 - 300: 5 units CBG 301 - 350: 7 units CBG 351 - 400: 9 units CBG > 400: call MD Patient taking differently: Inject 0-14 Units into the skin See admin instructions. Inject 0-14 units into the skin three times a day before meals and at bedtime, per "PEC sliding scale for resident's FSBS result: BGL <80 or >400, place in PEC problem book for f/u; 0-200 = give nothing; 201-250 = 2 units; 251-300 = 4 units; 301-350 = 6 units; 351-400 = 8 units; 401-450 = 10 units; 451-500 = 12 units; >500 = 14 units and re-check in 2 hrs/call MD if FSBS remains over 500." 01/08/21  Yes Ghimire, Henreitta Leber, MD  ipratropium-albuterol (DUONEB) 0.5-2.5 (3) MG/3ML SOLN Take 3 mLs by nebulization every 8 (eight) hours. 01/08/21  Yes Ghimire, Henreitta Leber, MD  levothyroxine (SYNTHROID) 150 MCG tablet Take 150 mcg by mouth daily before breakfast. 09/01/20  Yes [provider]  lidocaine (LIDODERM) 5 % Place 1 patch onto the skin daily. Remove & Discard patch within 12 hours or as directed by MD Patient taking differently: Place 1 patch onto the skin See admin instructions. Apply 1 patch topically daily and remove at bedtime- (Remove & Discard patch within 12 hours or as directed  by MD) 01/09/21  Yes Ghimire, Henreitta Leber, MD  oxyCODONE (OXY IR/ROXICODONE) 5 MG immediate release tablet Take 1-2 tablets (5-10 mg total) by mouth every 6 (six) hours as needed for moderate pain or severe pain. 01/08/21  Yes Ghimire, Henreitta Leber, MD  OXYGEN Inhale 2 L/min into the lungs continuous.   Yes [provider]  pantoprazole (PROTONIX) 40 MG tablet Take 1 tablet (40 mg total) by mouth daily. Patient taking differently: Take 40 mg by mouth in the morning. 01/09/21  Yes Ghimire, Henreitta Leber, MD  polyethylene glycol (MIRALAX / GLYCOLAX) 17 g packet Take 17 g by mouth daily as needed. Patient taking differently: Take 17 g by mouth daily as needed for mild constipation. 01/08/21  Yes Ghimire, Henreitta Leber, MD  pramipexole (MIRAPEX) 1.5 MG tablet Take 1.5 mg by mouth 3 (three) times daily. 06/29/20  Yes [provider]  QUEtiapine (SEROQUEL) 25 MG tablet Take 1 tablet (  25 mg total) by mouth at bedtime. 01/08/21  Yes Ghimire, Henreitta Leber, MD  sodium chloride 1 g tablet Take 1 tablet (1 g total) by mouth 2 (two) times daily with a meal. 01/08/21  Yes Jonetta Osgood, MD    Physical Exam: Vitals:   01/12/21 2045 01/12/21 2100 01/12/21 2104 01/12/21 2215  BP: (!) 130/57 (!) 127/58  (!) 112/55  Pulse: 84 91  81  Resp: '19 18  18  ' Temp:   99.3 F (37.4 C)   TempSrc:   Oral   SpO2: 92% 100%  100%  Weight:      Height:        Constitutional: NAD, calm, comfortable, nontoxic appearing elderly female laying flat in bed Vitals:   01/12/21 2045 01/12/21 2100 01/12/21 2104 01/12/21 2215  BP: (!) 130/57 (!) 127/58  (!) 112/55  Pulse: 84 91  81  Resp: '19 18  18  ' Temp:   99.3 F (37.4 C)   TempSrc:   Oral   SpO2: 92% 100%  100%  Weight:      Height:       Eyes: lids and conjunctivae normal ENMT: Mucous membranes are moist.  Neck: normal, supple Respiratory: clear to auscultation bilaterally, no wheezing, no crackles. Normal respiratory effort on 2 L. No accessory muscle use.   Cardiovascular: Regular rate and rhythm, no murmurs / rubs / gallops. No extremity edema.  Abdomen: no tenderness, no masses palpated.  Back: Midthoracic to lumbar protocol healing surgical incision without any surrounding erythema or drainage.  Mid thoracic horizontal skin tear from dressing tape Musculoskeletal: no clubbing / cyanosis.  Edematous left upper extremity especially around her hands.  There is also small open wounds distal to the left antecubital region.   Skin: wounds as described above Neurologic: Alert and oriented to self and place only.  Patient able to repeat my questions but not able to give appropriate answers.  Not able to recall events of today.  CN 2-12 grossly intact. Strength 5/5 in all 4.  Psychiatric: Normal judgment and insight. Alert and oriented x 3. Normal mood.     Labs on Admission: I have personally reviewed following labs and imaging studies  CBC: Recent Labs  Lab 01/06/21 0554 01/07/21 0147 01/12/21 1907  WBC 5.7 5.0 7.6  NEUTROABS  --   --  5.0  HGB 8.0* 9.1* 8.7*  HCT 24.1* 28.1* 26.9*  MCV 88.3 90.4 88.8  PLT 190 187 712   Basic Metabolic Panel: Recent Labs  Lab 01/06/21 0554 01/07/21 0147 01/08/21 0111 01/12/21 1907 01/12/21 2107  NA 129* 131* 130* 129*  --   K 3.8 3.2* 3.6 2.3*  --   CL 93* 94* 96* 88*  --   CO2 '28 29 27 30  ' --   GLUCOSE 135* 123* 91 131*  --   BUN 9 8 6* 11  --   CREATININE 0.80 0.69 0.75 0.89  --   CALCIUM 7.3* 7.2* 7.1* 6.0*  --   MG  --   --   --   --  1.1*  PHOS 2.9 3.5  --   --   --    GFR: Estimated Creatinine Clearance: 54.2 mL/min (by C-G formula based on SCr of 0.89 mg/dL). Liver Function Tests: Recent Labs  Lab 01/06/21 0554 01/06/21 0951 01/07/21 0147 01/12/21 1907  AST  --  22  --  23  ALT  --  <5  --  6  ALKPHOS  --  147*  --  189*  BILITOT  --  2.6*  --  1.1  PROT  --  6.2*  --  6.1*  ALBUMIN 2.6* 2.7* 2.4* 2.1*   No results for input(s): LIPASE, AMYLASE in the last 168  hours. Recent Labs  Lab 01/06/21 0951  AMMONIA 22   Coagulation Profile: Recent Labs  Lab 01/12/21 1907  INR 1.3*   Cardiac Enzymes: No results for input(s): CKTOTAL, CKMB, CKMBINDEX, TROPONINI in the last 168 hours. BNP (last 3 results) No results for input(s): PROBNP in the last 8760 hours. HbA1C: No results for input(s): HGBA1C in the last 72 hours. CBG: Recent Labs  Lab 01/08/21 0421 01/08/21 0748 01/08/21 1206 01/08/21 1542 01/08/21 1958  GLUCAP 104* 79 124* 134* 116*   Lipid Profile: No results for input(s): CHOL, HDL, LDLCALC, TRIG, CHOLHDL, LDLDIRECT in the last 72 hours. Thyroid Function Tests: No results for input(s): TSH, T4TOTAL, FREET4, T3FREE, THYROIDAB in the last 72 hours. Anemia Panel: No results for input(s): VITAMINB12, FOLATE, FERRITIN, TIBC, IRON, RETICCTPCT in the last 72 hours. Urine analysis:    Component Value Date/Time   COLORURINE YELLOW 01/12/2021 1907   APPEARANCEUR HAZY (A) 01/12/2021 1907   LABSPEC 1.014 01/12/2021 1907   PHURINE 6.0 01/12/2021 1907   GLUCOSEU 50 (A) 01/12/2021 1907   HGBUR SMALL (A) 01/12/2021 1907   BILIRUBINUR NEGATIVE 01/12/2021 1907   KETONESUR NEGATIVE 01/12/2021 1907   PROTEINUR 30 (A) 01/12/2021 1907   UROBILINOGEN 0.2 09/15/2012 1536   NITRITE POSITIVE (A) 01/12/2021 1907   LEUKOCYTESUR LARGE (A) 01/12/2021 1907    Radiological Exams on Admission: DG Chest 1 View  Result Date: 01/12/2021 CLINICAL DATA:  Altered mental status EXAM: CHEST  1 VIEW COMPARISON:  01/06/2021 FINDINGS: Lungs are clear. No pneumothorax or pleural effusion. Right upper extremity PICC line tip is seen within the superior cavoatrial junction. Cardiac size within normal limits. Left subclavian dual lead pacemaker is unchanged with leads overlying the right atrium and right ventricle. Pulmonary vascularity is normal. No acute bone abnormality. Right total shoulder arthroplasty has been performed. IMPRESSION: No active disease.  Electronically Signed   By: Fidela Salisbury M.D.   On: 01/12/2021 20:36   DG Elbow Complete Left  Result Date: 01/12/2021 CLINICAL DATA:  Left elbow pain EXAM: LEFT ELBOW - COMPLETE 3+ VIEW COMPARISON:  None. FINDINGS: Normal alignment. No acute fracture or dislocation. Joint spaces appear preserved. No definite effusion though lateral positioning is suboptimal. Extensive diffuse soft tissue swelling is seen of the visualized left upper extremity. IMPRESSION: Diffuse soft tissue swelling.  No acute fracture or dislocation. Electronically Signed   By: Fidela Salisbury M.D.   On: 01/12/2021 20:37   DG Forearm Left  Result Date: 01/12/2021 CLINICAL DATA:  Left arm pain EXAM: LEFT FOREARM - 2 VIEW COMPARISON:  None. FINDINGS: No acute fracture or dislocation. Normal alignment. No osseous erosions or abnormal periosteal reaction. Diffuse subcutaneous edema is seen of the left forearm more severe along the dorsal aspect. IMPRESSION: Extensive soft tissue swelling.  No acute fracture or dislocation. Electronically Signed   By: Fidela Salisbury M.D.   On: 01/12/2021 20:40   DG Wrist Complete Left  Result Date: 01/12/2021 CLINICAL DATA:  Left wrist pain EXAM: LEFT WRIST - COMPLETE 3+ VIEW COMPARISON:  None. FINDINGS: Normal alignment. No acute fracture or dislocation. Joint spaces are preserved. Extensive surrounding soft tissue swelling is noted. Mild dystrophic calcification involving the TFCC versus remote nonunited ulnar styloid fracture. IMPRESSION: No acute fracture  or dislocation.  Extensive soft tissue swelling. Electronically Signed   By: Fidela Salisbury M.D.   On: 01/12/2021 20:41      Assessment/Plan  Sepsis secondary to UTI w/ indwelling catheter -Patient presented with fever, tachycardia and positive UA - Initially started on broad-spectrum antibiotics with IV vancomycin, Flagyl and cefepime.  Will continue IV cefepime with positive UA given history of pseudomonal UTI. -urine culture pending    Acute metabolic encephalopathy secondary to UTI w/indwelling catheter  -Patient initially started on broad-spectrum antibiotics with IV vancomycin, Flagyl and cefepime.  Will continue IV cefepime with positive UA given history of pseudomonal UTI.  Elevated troponin  Troponin of 155 -->145. EKG normal sinus rhythm and no ST or T wave changes. Suspect more demand  Hyponatremia -continuous IV NS fluid overnight and will repeat Na- she was fluid restricted during last admission -Continue salt tablet supplementation  Hypokalemia -K of 2.3. Replete with IV K 46mq x 3 -replete Mg  Hypomagesium -Mg of 1.1. Will replete   Hypocalcemia Corrected calcium of 7.1 -Give IV calcium gluconate  -Continue calcium supplementation  Left UE edema  -obtain venous doppler ultrasound  -had CT left elbow on 12/10 showing soft tissue edema with ill-defined fluid most pronounced along the ulnar aspect of the proximal forearm suggesting cellulitis.  No joint effusion to suggest septic arthritis. -Repeat x-rays today were negative  History of recent E.coli bacteremia epidural abscess and laminectomy -L4-L5 laminectomy with evacuation of epidural abscess on 12/12 with neurosurgery  - No active infection to healing surgical wound - Patient was placed on IV Ancef until 03/01/2021 by ID.Currently on IV cefepime for UTI.   History of urinary retention - Patient was discharged from recent admission with Foley after she failed voiding trial on 12/15. -consider trial tomorrow or exchange catheter while receiving IV antibiotics for UTI  Chronic diastolic heart failure Appears euvolemic on exam.  Hold Lasix overnight while receiving IV fluids.  Will resume tomorrow.  Type 2 diabetes Last hemoglobin A1c of 8.4 in October Placed on sensitive sliding scale insulin  Hypothyroidism Continue levothyroxine  DVT prophylaxis:.Lovenox Code Status: Full Family Communication: Plan discussed with patient at bedside   disposition Plan: Home with at least 2 midnight stays  Consults called:  Admission status: inpatient    Level of care: Telemetry Medical  Status is: Inpatient  Remains inpatient appropriate because:  Admit - It is my clinical opinion that admission to INPATIENT is reasonable and necessary because this patient will require at least 2 midnights in the hospital to treat this condition based on the medical complexity of the problems presented.  Given the aforementioned information, the predictability of an adverse outcome is felt to be significant.         COrene DesanctisDO [facility]   If 7PM-7AM, please contact night-coverage [email or web address]   01/13/2021, 12:02 AM

## 2021-01-14 LAB — GLUCOSE, CAPILLARY
Glucose-Capillary: 117 mg/dL — ABNORMAL HIGH (ref 70–99)
Glucose-Capillary: 93 mg/dL (ref 70–99)
Glucose-Capillary: 128 mg/dL — ABNORMAL HIGH (ref 70–99)
Glucose-Capillary: 164 mg/dL — ABNORMAL HIGH (ref 70–99)

## 2021-01-14 LAB — BASIC METABOLIC PANEL
Anion gap: 8 (ref 5–15)
BUN: 10 mg/dL (ref 8–23)
CO2: 24 mmol/L (ref 22–32)
Calcium: 6.4 mg/dL — CL (ref 8.9–10.3)
Chloride: 97 mmol/L — ABNORMAL LOW (ref 98–111)
Creatinine, Ser: 0.68 mg/dL (ref 0.44–1.00)
GFR, Estimated: >60 mL/min (ref >60)
Glucose, Bld: 131 mg/dL — ABNORMAL HIGH (ref 70–99)
Potassium: 4.1 mmol/L (ref 3.5–5.1)
Sodium: 129 mmol/L — ABNORMAL LOW (ref 135–145)

## 2021-01-14 LAB — CBC
HCT: 22.9 % — ABNORMAL LOW (ref 36.0–46.0)
Hemoglobin: 7.5 g/dL — ABNORMAL LOW (ref 12.0–15.0)
MCH: 29.3 pg (ref 26.0–34.0)
MCHC: 32.8 g/dL (ref 30.0–36.0)
MCV: 89.5 fL (ref 80.0–100.0)
Platelets: 258 10*3/uL (ref 150–400)
RBC: 2.56 MIL/uL — ABNORMAL LOW (ref 3.87–5.11)
RDW: 17.2 % — ABNORMAL HIGH (ref 11.5–15.5)
WBC: 7.1 10*3/uL (ref 4.0–10.5)
nRBC: 0 % (ref 0.0–0.2)

## 2021-01-14 LAB — MAGNESIUM: Magnesium: 1.5 mg/dL — ABNORMAL LOW (ref 1.7–2.4)

## 2021-01-14 LAB — PHOSPHORUS: Phosphorus: 5 mg/dL — ABNORMAL HIGH (ref 2.5–4.6)

## 2021-01-14 MED ORDER — SODIUM CHLORIDE 0.9 % IV SOLN
2.0000 g | Freq: Three times a day (TID) | INTRAVENOUS | Status: AC
  Administered 2021-01-14 – 2021-01-19 (×17): 2 g via INTRAVENOUS
  Filled 2021-01-14 (×17): qty 2

## 2021-01-14 MED ORDER — SODIUM CHLORIDE 1 G PO TABS
1.0000 g | ORAL_TABLET | Freq: Three times a day (TID) | ORAL | Status: AC
  Administered 2021-01-14 (×3): 1 g via ORAL
  Filled 2021-01-14 (×3): qty 1

## 2021-01-14 MED ORDER — CALCIUM GLUCONATE-NACL 2-0.675 GM/100ML-% IV SOLN
2.0000 g | Freq: Once | INTRAVENOUS | Status: AC
  Administered 2021-01-14: 2000 mg via INTRAVENOUS
  Filled 2021-01-14: qty 100

## 2021-01-14 MED ORDER — MAGNESIUM SULFATE 4 GM/100ML IV SOLN
4.0000 g | Freq: Once | INTRAVENOUS | Status: AC
  Administered 2021-01-14: 4 g via INTRAVENOUS
  Filled 2021-01-14: qty 100

## 2021-01-14 MED ORDER — SODIUM CHLORIDE 0.9 % IV SOLN: INTRAVENOUS | Status: AC

## 2021-01-14 MED ORDER — MAGNESIUM SULFATE 2 GM/50ML IV SOLN
2.0000 g | Freq: Once | INTRAVENOUS | Status: AC
Start: 2021-01-14 — End: 2021-01-14
  Administered 2021-01-14: 2 g via INTRAVENOUS
  Filled 2021-01-14: qty 50

## 2021-01-14 NOTE — Evaluation (Signed)
Physical Therapy Evaluation Patient Details Name: Jillian Leblanc MRN: [id number] DOB: 02-21-1945 Today's Date: 01/14/2021  History of Present Illness  pt is a 75 y/o female admitted 12/20 from SNF for recent septic shock from [facility] bacteremia L4-S1 osteo with epidural abscess, s/p L45 lami and IV antibx.  Pt now admitted with AMS found to be due to UTI/Cauti.  PMHx:  DM2, HFpEF  Clinical Impression  Pt admitted with/for AMS due to UTI/Cauti.  Pt still very debilitated, needing mod/max assist for basic mobility which is limited at best.  Pt wants to go home for Christmas, but is not ready at this time.  Pt currently limited functionally due to the problems listed. ( See problems list.)   Pt will benefit from PT to maximize function and safety in order to get ready for next venue listed below.        Recommendations for follow up therapy are one component of a multi-disciplinary discharge planning process, led by the attending physician.  Recommendations may be updated based on patient status, additional functional criteria and insurance authorization.  Follow Up Recommendations Skilled nursing-short term rehab (<3 hours/day)    Assistance Recommended at Discharge Frequent or constant Supervision/Assistance  Functional Status Assessment Patient has had a recent decline in their functional status and demonstrates the ability to make significant improvements in function in a reasonable and predictable amount of time.  Equipment Recommendations  Wheelchair (measurements PT);Wheelchair cushion (measurements PT)    Recommendations for Other Services       Precautions / Restrictions Precautions Precautions: Fall;Back Required Braces or Orthoses: Spinal Brace Spinal Brace: Thoracolumbosacral orthotic      Mobility  Bed Mobility Overal bed mobility: Needs Assistance Bed Mobility: Rolling;Sidelying to Sit Rolling: Mod assist Sidelying to sit: Max assist (+2 helpful)       General bed  mobility comments: assist for all aspects, assist roll until pt grabbed the rail, then significant truncal assist up to sitting.    Transfers Overall transfer level: Needs assistance   Transfers: Sit to/from Stand Sit to Stand: Max assist           General transfer comment: pt unable to attain standing via RW, but needed face to face assist and guarding of bil knees once up.  Extra assist to attain full upright posture.    Ambulation/Gait               General Gait Details: unable  Stairs            Wheelchair Mobility    Modified Rankin (Stroke Patients Only)       Balance Overall balance assessment: Needs assistance Sitting-balance support: Bilateral upper extremity supported;Feet supported Sitting balance-Leahy Scale: Poor Sitting balance - Comments: UE support and supervision for static sitting   Standing balance support: Bilateral upper extremity supported Standing balance-Leahy Scale: Poor Standing balance comment: reliant on external support                             Pertinent Vitals/Pain Pain Assessment: Faces Faces Pain Scale: Hurts a little bit Pain Location: back Pain Descriptors / Indicators: Discomfort;Dull;Guarding    Home Living Family/patient expects to be discharged to:: Skilled nursing facility Living Arrangements: Spouse/significant other Available Help at Discharge: Family Type of Home: House Home Access: Stairs to enter Entrance Stairs-Rails: Right Entrance Stairs-Number of Steps: 1   Home Layout: Two level;Full bath on main level;Able to live on main level with bedroom/bathroom  Home Equipment: Conservation officer, nature (2 wheels);Cane - quad;Grab bars - toilet;Grab bars - tub/shower;Wheelchair - manual;BSC/3in1;Shower seat      Prior Function Prior Level of Function : History of Falls (last six months);Needs assist             Mobility Comments: h/o falls, uses RW vs w/c -reports use of DME new for pt, limited  household ambulator ADLs Comments: reports typically able to complete ADLs though increasing difficulty due to falls; assist with IADLs     Hand Dominance   Dominant Hand: Right    Extremity/Trunk Assessment   Upper Extremity Assessment Upper Extremity Assessment: Generalized weakness    Lower Extremity Assessment Lower Extremity Assessment: Generalized weakness LLE Deficits / Details: grossly 3- with 3/5 gross extension    Cervical / Trunk Assessment Cervical / Trunk Assessment: Kyphotic  Communication   Communication: No difficulties  Cognition Arousal/Alertness: Awake/alert Behavior During Therapy: Anxious Overall Cognitive Status: No family/caregiver present to determine baseline cognitive functioning (NT formally, pt not able to relate a clear timeline of events.)                                          General Comments General comments (skin integrity, edema, etc.): sats 100% on RA,  HR in the lower 80's    Exercises     Assessment/Plan    PT Assessment Patient needs continued PT services  PT Problem List Decreased strength;Decreased activity tolerance;Decreased balance;Decreased mobility;Decreased coordination;Decreased knowledge of use of DME;Decreased knowledge of precautions;Pain       PT Treatment Interventions DME instruction;Gait training;Functional mobility training;Therapeutic activities;Therapeutic exercise;Balance training;Patient/family education    PT Goals (Current goals can be found in the Care Plan section)  Acute Rehab PT Goals Patient Stated Goal: Home for Christmas PT Goal Formulation: With patient Time For Goal Achievement: 01/28/21 Potential to Achieve Goals: Fair    Frequency Min 3X/week   Barriers to discharge        Co-evaluation               AM-PAC PT "6 Clicks" Mobility  Outcome Measure Help needed turning from your back to your side while in a flat bed without using bedrails?: A Lot Help needed  moving from lying on your back to sitting on the side of a flat bed without using bedrails?: A Lot Help needed moving to and from a bed to a chair (including a wheelchair)?: Total Help needed standing up from a chair using your arms (e.g., wheelchair or bedside chair)?: Total Help needed to walk in hospital room?: Total Help needed climbing 3-5 steps with a railing? : Total 6 Click Score: 8    End of Session   Activity Tolerance: Patient limited by fatigue Patient left: in chair;with call bell/phone within reach;with chair alarm set (lift pad) Nurse Communication: Mobility status;Need for lift equipment;Other (comment) (or STEDY) PT Visit Diagnosis: Other abnormalities of gait and mobility (R26.89);Muscle weakness (generalized) (M62.81)    Time: [id number] PT Time Calculation (min) (ACUTE ONLY): 43 min   Charges:   PT Evaluation $PT Eval Moderate Complexity: 1 Mod PT Treatments $Therapeutic Activity: 23-37 mins        01/14/2021  Ginger Carne., PT Acute Rehabilitation Services [phone]  (pager) [phone]  (office)  Tessie Fass Alvetta Hidrogo 01/14/2021, 4:50 PM

## 2021-01-14 NOTE — Progress Notes (Signed)
PROGRESS NOTE    Jillian Leblanc  [id number] DOB: 1945/07/02 DOA: 01/12/2021 PCP: Guadlupe Spanish, MD   Brief Narrative:  75 year old with history of DM2, sinus pause status post pacemaker, CHF with preserved EF, hypothyroidism admitted for altered mental status.  Patient was discharged 5 days ago after being treated for E. coli bacteremia, lumbar osteomyelitis with epidural abscess underwent laminectomy with evacuation of abscess.  Discharged on IV Ancef until 03/01/2021 by ID.  Also had urinary retention therefore was discharged on Foley catheter.  At SNF patient became confused having auditory and visual hallucination therefore brought to the hospital and was found to have sepsis secondary to UTI/CAUTI and multiple electrolyte imbalance.   Assessment & Plan:   Principal Problem:   Sepsis ([place]) Active Problems:   Hyponatremia   Hypokalemia   Elevated troponin   Hypothyroidism   Acute metabolic encephalopathy   Chronic diastolic CHF (congestive heart failure) (HCC)   Hypocalcemia   Hypomagnesemia   Sepsis secondary to UTI ([place])   Acute urinary retention   Urosepsis secondary to indwelling catheter; CAUTI -Blood Cultures- No growth. Urine Cx pending.  Empiric antibiotics- Cefepime -Foley exchanged in ED yesterday.    Acute metabolic encephalopathy secondary to UTI w/indwelling catheter  -Mentation improved.    Elevated troponin  -Demand ischemia, remained flat.   Hyponatremia -Improving with IVF.    Hypokalemia/hypomagnesemia/hypocalcemia -Aggressive repletion. K and Phos better. Will give Mg and Ca Gluc again today.    Left UE edema  -Venous Dopplers- SVT of the left arm, but no DVT.    History of recent E.coli bacteremia epidural abscess and laminectomy -Status post L4-L5 laminectomy with evacuation of epidural abscess on 12/12 with neurosurgery  - No active infection to healing surgical wound - Patient was placed on IV Ancef until 03/01/2021 by ID.Currently on IV  cefepime for UTI.   Anemia of chronic disease -Baseline hemoglobin 8.0.  No obvious signs of bleeding.   History of urinary retention -Foley catheter was placed prior to her discharge on 12/15.  We will request nursing staff to exchange this.-Currently holding Lasix, getting gentle hydration.   Chronic diastolic heart failure Dehydrated, hold lasix. Continue gentle hydration for another 24 hrs.    Type 2 diabetes Last hemoglobin A1c of 8.4 in October -Sliding scale Accu-Cheks   Hypothyroidism Continue levothyroxine      DVT prophylaxis: Lovenox  Code Status: Full code Family Communication: Daughter updated by me  Status is: Inpatient  Remains inpatient appropriate because: Maintain hosp stay for further IV electrolyte repetion. Continue IV Cefepime while awaiting futher culture data. PT/OT therefore after.    Nutritional status           Body mass index is 36.29 kg/m.  Pressure Injury 12/19/20 Sacrum Medial Stage 2 -  Partial thickness loss of dermis presenting as a shallow open injury with a red, pink wound bed without slough. stage 2  sacrum  4cm x .5cm (Active)  12/19/20 1640  Location: Sacrum  Location Orientation: Medial  Staging: Stage 2 -  Partial thickness loss of dermis presenting as a shallow open injury with a red, pink wound bed without slough.  Wound Description (Comments): stage 2  sacrum  4cm x .5cm  Present on Admission: Yes          Subjective: Feeling better. No acute events overnight.   Review of Systems Otherwise negative except as per HPI, including: General = no fevers, chills, dizziness,  fatigue HEENT/EYES = negative for loss of vision,  double vision, blurred vision,  sore throa Cardiovascular= negative for chest pain, palpitation Respiratory/lungs= negative for shortness of breath, cough, wheezing; hemoptysis,  Gastrointestinal= negative for nausea, vomiting, abdominal pain Genitourinary= negative for Dysuria MSK = Negative  for arthralgia, myalgias Neurology= Negative for headache, numbness, tingling  Psychiatry= Negative for suicidal and homocidal ideation Skin= Negative for Rash   Examination:  Constitutional: Not in acute distress Respiratory: Clear to auscultation bilaterally Cardiovascular: Normal sinus rhythm, no rubs Abdomen: Nontender nondistended good bowel sounds Musculoskeletal: No edema noted Skin: No rashes seen Neurologic: CN 2-12 grossly intact.  And nonfocal Psychiatric: Normal judgment and insight. Alert and oriented x 3. Normal mood.  Foley in place.   Objective: Vitals:   01/13/21 1150 01/13/21 1626 01/13/21 2128 01/14/21 0435  BP: (!) 122/58 (!) 119/91 137/82 114/60  Pulse: 92 (!) 104 96 86  Resp: (!) 21 (!) 24 18 20   Temp: 100.1 F (37.8 C) 100 F (37.8 C) 100.2 F (37.9 C) 98.1 F (36.7 C)  TempSrc: Oral Oral Oral Oral  SpO2: 100% 96% 99% 100%  Weight:      Height:        Intake/Output Summary (Last 24 hours) at 01/14/2021 0806 Last data filed at 01/13/2021 2200 Gross per 24 hour  Intake 881.03 ml  Output --  Net 881.03 ml   Filed Weights   01/12/21 1838  Weight: 84.3 kg     Data Reviewed:   CBC: Recent Labs  Lab 01/12/21 1907 01/14/21 0256  WBC 7.6 7.1  NEUTROABS 5.0  --   HGB 8.7* 7.5*  HCT 26.9* 22.9*  MCV 88.8 89.5  PLT 208 258   Basic Metabolic Panel: Recent Labs  Lab 01/08/21 0111 01/12/21 1907 01/12/21 2107 01/13/21 0036 01/13/21 0459 01/13/21 0808 01/14/21 0256  NA 130* 129*  --  128*  --  128* 129*  K 3.6 2.3*  --  2.5*  --  3.4* 4.1  CL 96* 88*  --  91*  --  92* 97*  CO2 27 30  --  28  --  25 24  GLUCOSE 91 131*  --  132*  --  129* 131*  BUN 6* 11  --  11  --  11 10  CREATININE 0.75 0.89  --  0.73  --  0.73 0.68  CALCIUM 7.1* 6.0*  --  6.3*  --  6.5* 6.4*  MG  --   --  1.1*  --  1.8  --  1.5*  PHOS  --   --   --   --   --  3.2 5.0*   GFR: Estimated Creatinine Clearance: 60.3 mL/min (by C-G formula based on SCr of 0.68  mg/dL). Liver Function Tests: Recent Labs  Lab 01/12/21 1907  AST 23  ALT 6  ALKPHOS 189*  BILITOT 1.1  PROT 6.1*  ALBUMIN 2.1*   No results for input(s): LIPASE, AMYLASE in the last 168 hours. No results for input(s): AMMONIA in the last 168 hours.  Coagulation Profile: Recent Labs  Lab 01/12/21 1907  INR 1.3*   Cardiac Enzymes: No results for input(s): CKTOTAL, CKMB, CKMBINDEX, TROPONINI in the last 168 hours. BNP (last 3 results) No results for input(s): PROBNP in the last 8760 hours. HbA1C: No results for input(s): HGBA1C in the last 72 hours. CBG: Recent Labs  Lab 01/13/21 0803 01/13/21 1150 01/13/21 1625 01/13/21 2127 01/14/21 0656  GLUCAP 88 113* 133* 128* 117*   Lipid Profile: No results for input(s): CHOL, HDL,  LDLCALC, TRIG, CHOLHDL, LDLDIRECT in the last 72 hours. Thyroid Function Tests: No results for input(s): TSH, T4TOTAL, FREET4, T3FREE, THYROIDAB in the last 72 hours. Anemia Panel: No results for input(s): VITAMINB12, FOLATE, FERRITIN, TIBC, IRON, RETICCTPCT in the last 72 hours. Sepsis Labs: Recent Labs  Lab 01/12/21 1902 01/12/21 2107  LATICACIDVEN 1.8 1.2    Recent Results (from the past 240 hour(s))  Surgical pcr screen     Status: Abnormal   Collection Time: 01/04/21  1:07 PM   Specimen: Nasal Mucosa; Nasal Swab  Result Value Ref Range Status   MRSA, PCR POSITIVE (A) NEGATIVE Final    Comment: RESULT CALLED TO, READ BACK BY AND VERIFIED WITH: RM C GANOE ES:[id number] AT 1443 BY CM    Staphylococcus aureus POSITIVE (A) NEGATIVE Final    Comment: (NOTE) The Xpert SA Assay (FDA approved for NASAL specimens in patients 14 years of age and older), is one component of a comprehensive surveillance program. It is not intended to diagnose infection nor to guide or monitor treatment. Performed at [facility], [place] [address] [place], [place] [address]   Aerobic/Anaerobic Culture w Gram Stain (surgical/deep wound)     Status: None    Collection Time: 01/04/21  6:48 PM   Specimen: PATH Other; Tissue  Result Value Ref Range Status   Specimen Description ABSCESS  Final   Special Requests EPIDURAL PT ON ANCEF  Final   Gram Stain NO WBC SEEN NO ORGANISMS SEEN   Final   Culture   Final    RARE ESCHERICHIA COLI NO ANAEROBES ISOLATED Performed at [facility], [place] [address] [place], [place] [address]    Report Status 01/10/2021 FINAL  Final   Organism ID, Bacteria ESCHERICHIA COLI  Final      Susceptibility   Escherichia coli - MIC*    AMPICILLIN 8 SENSITIVE Sensitive     CEFAZOLIN <=4 SENSITIVE Sensitive     CEFEPIME <=0.12 SENSITIVE Sensitive     CEFTAZIDIME <=1 SENSITIVE Sensitive     CEFTRIAXONE <=0.25 SENSITIVE Sensitive     CIPROFLOXACIN <=0.25 SENSITIVE Sensitive     GENTAMICIN <=1 SENSITIVE Sensitive     IMIPENEM <=0.25 SENSITIVE Sensitive     TRIMETH/SULFA <=20 SENSITIVE Sensitive     AMPICILLIN/SULBACTAM <=2 SENSITIVE Sensitive     PIP/TAZO <=4 SENSITIVE Sensitive     * RARE ESCHERICHIA COLI  Culture, blood (routine x 2)     Status: None   Collection Time: 01/06/21  8:46 AM   Specimen: BLOOD LEFT HAND  Result Value Ref Range Status   Specimen Description BLOOD LEFT HAND  Final   Special Requests   Final    BOTTLES DRAWN AEROBIC ONLY Blood Culture adequate volume   Culture   Final    NO GROWTH 5 DAYS Performed at [facility], [address], [place], [place] [address]    Report Status 01/11/2021 FINAL  Final  Culture, blood (routine x 2)     Status: None   Collection Time: 01/06/21  8:47 AM   Specimen: BLOOD RIGHT HAND  Result Value Ref Range Status   Specimen Description BLOOD RIGHT HAND  Final   Special Requests   Final    BOTTLES DRAWN AEROBIC ONLY Blood Culture adequate volume   Culture   Final    NO GROWTH 5 DAYS Performed at [facility], [place] [address] [place], [place] [address]    Report Status 01/11/2021 FINAL  Final  Urine Culture     Status: Abnormal    Collection Time: 01/06/21  2:24 PM   Specimen: Urine, Catheterized  Result Value Ref Range Status   Specimen Description URINE, CATHETERIZED  Final   Special Requests   Final    NONE Performed at [facility], [address] [place], [place] [address]    Culture >=100,000 COLONIES/mL PSEUDOMONAS AERUGINOSA (A)  Final   Report Status 01/08/2021 FINAL  Final   Organism ID, Bacteria PSEUDOMONAS AERUGINOSA (A)  Final      Susceptibility   Pseudomonas aeruginosa - MIC*    CEFTAZIDIME 4 SENSITIVE Sensitive     CIPROFLOXACIN <=0.25 SENSITIVE Sensitive     GENTAMICIN <=1 SENSITIVE Sensitive     IMIPENEM 2 SENSITIVE Sensitive     PIP/TAZO 8 SENSITIVE Sensitive     CEFEPIME 2 SENSITIVE Sensitive     * >=100,000 COLONIES/mL PSEUDOMONAS AERUGINOSA  Resp Panel by RT-PCR (Flu A&B, Covid) Nasopharyngeal Swab     Status: None   Collection Time: 01/07/21  1:48 PM   Specimen: Nasopharyngeal Swab; Nasopharyngeal(NP) swabs in vial transport medium  Result Value Ref Range Status   SARS Coronavirus 2 by RT PCR NEGATIVE NEGATIVE Final    Comment: (NOTE) SARS-CoV-2 target nucleic acids are NOT DETECTED.  The SARS-CoV-2 RNA is generally detectable in upper respiratory specimens during the acute phase of infection. The lowest concentration of SARS-CoV-2 viral copies this assay can detect is 138 copies/mL. A negative result does not preclude SARS-Cov-2 infection and should not be used as the sole basis for treatment or other patient management decisions. A negative result may occur with  improper specimen collection/handling, submission of specimen other than nasopharyngeal swab, presence of viral mutation(s) within the areas targeted by this assay, and inadequate number of viral copies(<138 copies/mL). A negative result must be combined with clinical observations, patient history, and epidemiological information. The expected result is Negative.  Fact Sheet for Patients:   EntrepreneurPulse.com.au  Fact Sheet for Healthcare Providers:  IncredibleEmployment.be  This test is no t yet approved or cleared by the [place] FDA and  has been authorized for detection and/or diagnosis of SARS-CoV-2 by FDA under an Emergency Use Authorization (EUA). This EUA will remain  in effect (meaning this test can be used) for the duration of the COVID-19 declaration under Section 564(b)(1) of the Act, 21 U.S.C.section 360bbb-3(b)(1), unless the authorization is terminated  or revoked sooner.       Influenza A by PCR NEGATIVE NEGATIVE Final   Influenza B by PCR NEGATIVE NEGATIVE Final    Comment: (NOTE) The Xpert Xpress SARS-CoV-2/FLU/RSV plus assay is intended as an aid in the diagnosis of influenza from Nasopharyngeal swab specimens and should not be used as a sole basis for treatment. Nasal washings and aspirates are unacceptable for Xpert Xpress SARS-CoV-2/FLU/RSV testing.  Fact Sheet for Patients: EntrepreneurPulse.com.au  Fact Sheet for Healthcare Providers: IncredibleEmployment.be  This test is not yet approved or cleared by the [place] FDA and has been authorized for detection and/or diagnosis of SARS-CoV-2 by FDA under an Emergency Use Authorization (EUA). This EUA will remain in effect (meaning this test can be used) for the duration of the COVID-19 declaration under Section 564(b)(1) of the Act, 21 U.S.C. section 360bbb-3(b)(1), unless the authorization is terminated or revoked.  Performed at [facility], [place] [address], [place], [place] [address]   Resp Panel by RT-PCR (Flu A&B, Covid)     Status: None   Collection  Time: 01/12/21  7:07 PM   Specimen: Nasopharyngeal(NP) swabs in vial transport medium  Result Value Ref Range Status   SARS Coronavirus 2 by RT PCR NEGATIVE NEGATIVE Final    Comment: (NOTE) SARS-CoV-2 target nucleic acids are NOT DETECTED.  The  SARS-CoV-2 RNA is generally detectable in upper respiratory specimens during the acute phase of infection. The lowest concentration of SARS-CoV-2 viral copies this assay can detect is 138 copies/mL. A negative result does not preclude SARS-Cov-2 infection and should not be used as the sole basis for treatment or other patient management decisions. A negative result may occur with  improper specimen collection/handling, submission of specimen other than nasopharyngeal swab, presence of viral mutation(s) within the areas targeted by this assay, and inadequate number of viral copies(<138 copies/mL). A negative result must be combined with clinical observations, patient history, and epidemiological information. The expected result is Negative.  Fact Sheet for Patients:  EntrepreneurPulse.com.au  Fact Sheet for Healthcare Providers:  IncredibleEmployment.be  This test is no t yet approved or cleared by the [place] FDA and  has been authorized for detection and/or diagnosis of SARS-CoV-2 by FDA under an Emergency Use Authorization (EUA). This EUA will remain  in effect (meaning this test can be used) for the duration of the COVID-19 declaration under Section 564(b)(1) of the Act, 21 U.S.C.section 360bbb-3(b)(1), unless the authorization is terminated  or revoked sooner.       Influenza A by PCR NEGATIVE NEGATIVE Final   Influenza B by PCR NEGATIVE NEGATIVE Final    Comment: (NOTE) The Xpert Xpress SARS-CoV-2/FLU/RSV plus assay is intended as an aid in the diagnosis of influenza from Nasopharyngeal swab specimens and should not be used as a sole basis for treatment. Nasal washings and aspirates are unacceptable for Xpert Xpress SARS-CoV-2/FLU/RSV testing.  Fact Sheet for Patients: EntrepreneurPulse.com.au  Fact Sheet for Healthcare Providers: IncredibleEmployment.be  This test is not yet approved or  cleared by the [place] FDA and has been authorized for detection and/or diagnosis of SARS-CoV-2 by FDA under an Emergency Use Authorization (EUA). This EUA will remain in effect (meaning this test can be used) for the duration of the COVID-19 declaration under Section 564(b)(1) of the Act, 21 U.S.C. section 360bbb-3(b)(1), unless the authorization is terminated or revoked.  Performed at [facility], [place] [address] [place], [place] [address]   Blood Culture (routine x 2)     Status: None (Preliminary result)   Collection Time: 01/12/21  7:07 PM   Specimen: BLOOD  Result Value Ref Range Status   Specimen Description BLOOD LEFT ANTECUBITAL  Final   Special Requests   Final    BOTTLES DRAWN AEROBIC AND ANAEROBIC Blood Culture adequate volume   Culture   Final    NO GROWTH 2 DAYS Performed at [facility], [place] [address] [place], [place] [address]    Report Status PENDING  Incomplete  Blood Culture (routine x 2)     Status: None (Preliminary result)   Collection Time: 01/13/21  8:08 AM   Specimen: BLOOD  Result Value Ref Range Status   Specimen Description BLOOD LEFT ANTECUBITAL  Final   Special Requests   Final    BOTTLES DRAWN AEROBIC AND ANAEROBIC Blood Culture adequate volume   Culture   Final    NO GROWTH < 24 HOURS Performed at [facility], [place] [address] [place], [place] [address]    Report Status PENDING  Incomplete  Radiology Studies: DG Chest 1 View  Result Date: 01/12/2021 CLINICAL DATA:  Altered mental status EXAM: CHEST  1 VIEW COMPARISON:  01/06/2021 FINDINGS: Lungs are clear. No pneumothorax or pleural effusion. Right upper extremity PICC line tip is seen within the superior cavoatrial junction. Cardiac size within normal limits. Left subclavian dual lead pacemaker is unchanged with leads overlying the right atrium and right ventricle. Pulmonary vascularity is normal. No acute bone abnormality. Right total shoulder  arthroplasty has been performed. IMPRESSION: No active disease. Electronically Signed   By: Fidela Salisbury M.D.   On: 01/12/2021 20:36   DG Elbow Complete Left  Result Date: 01/12/2021 CLINICAL DATA:  Left elbow pain EXAM: LEFT ELBOW - COMPLETE 3+ VIEW COMPARISON:  None. FINDINGS: Normal alignment. No acute fracture or dislocation. Joint spaces appear preserved. No definite effusion though lateral positioning is suboptimal. Extensive diffuse soft tissue swelling is seen of the visualized left upper extremity. IMPRESSION: Diffuse soft tissue swelling.  No acute fracture or dislocation. Electronically Signed   By: Fidela Salisbury M.D.   On: 01/12/2021 20:37   DG Forearm Left  Result Date: 01/12/2021 CLINICAL DATA:  Left arm pain EXAM: LEFT FOREARM - 2 VIEW COMPARISON:  None. FINDINGS: No acute fracture or dislocation. Normal alignment. No osseous erosions or abnormal periosteal reaction. Diffuse subcutaneous edema is seen of the left forearm more severe along the dorsal aspect. IMPRESSION: Extensive soft tissue swelling.  No acute fracture or dislocation. Electronically Signed   By: Fidela Salisbury M.D.   On: 01/12/2021 20:40   DG Wrist Complete Left  Result Date: 01/12/2021 CLINICAL DATA:  Left wrist pain EXAM: LEFT WRIST - COMPLETE 3+ VIEW COMPARISON:  None. FINDINGS: Normal alignment. No acute fracture or dislocation. Joint spaces are preserved. Extensive surrounding soft tissue swelling is noted. Mild dystrophic calcification involving the TFCC versus remote nonunited ulnar styloid fracture. IMPRESSION: No acute fracture or dislocation.  Extensive soft tissue swelling. Electronically Signed   By: Fidela Salisbury M.D.   On: 01/12/2021 20:41   VAS [place] UPPER EXTREMITY VENOUS DUPLEX  Result Date: 01/13/2021 UPPER VENOUS STUDY  Patient Name:  Jillian Leblanc  Date of Exam:   01/13/2021 Medical Rec #: [id number]       Accession #:    [id number] Date of Birth: 1945-10-27       Patient Gender: F Patient Age:    24 years Exam [facility]  [facility] Procedure:      VAS [place] UPPER EXTREMITY VENOUS DUPLEX Referring Phys: CHING TU --------------------------------------------------------------------------------  Indications: Pain, and Swelling Risk Factors: Surgery 01-04-2021 Lumbar. Limitations: Multiple bandages, wounds, and patient pain tolerance. Comparison       01-01-2021 Prior left upper extremity venous was negative for Study:           DVT. Performing Technologist: Darlin Coco RDMS, RVT  Examination Guidelines: A complete evaluation includes B-mode imaging, spectral Doppler, color Doppler, and power Doppler as needed of all accessible portions of each vessel. Bilateral testing is considered an integral part of a complete examination. Limited examinations for reoccurring indications may be performed as noted.  Right Findings: +----------+------------+---------+-----------+----------+-------+  RIGHT      Compressible Phasicity Spontaneous Properties Summary  +----------+------------+---------+-----------+----------+-------+  Subclavian                 Yes        Yes                         +----------+------------+---------+-----------+----------+-------+  Left Findings: +----------+------------+---------+-----------+----------+---------------------+  LEFT       Compressible Phasicity Spontaneous Properties        Summary         +----------+------------+---------+-----------+----------+---------------------+  IJV            Full        Yes        Yes                                       +----------+------------+---------+-----------+----------+---------------------+  Subclavian                 Yes        Yes                                       +----------+------------+---------+-----------+----------+---------------------+  Axillary       Full        Yes        Yes                                       +----------+------------+---------+-----------+----------+---------------------+  Brachial       Full         Yes        Yes                  Some segments not                                                               well visualized due                                                             to bandaging and pain  +----------+------------+---------+-----------+----------+---------------------+  Radial                     Yes        Yes                 Unable to compress                                                                   due to pain       +----------+------------+---------+-----------+----------+---------------------+  Ulnar                      Yes        Yes                 Unable to compress  due to pain       +----------+------------+---------+-----------+----------+---------------------+  Cephalic       None        No         No                         Acute          +----------+------------+---------+-----------+----------+---------------------+  Basilic                    Yes        Yes                 Unable to compress                                                                   due to pain       +----------+------------+---------+-----------+----------+---------------------+  Summary:  Right: No evidence of thrombosis in the subclavian.  Left: No evidence of deep vein thrombosis in the upper extremity; however, portions of today's examination were limited. See technologist comments above. Findings consistent with acute superficial vein thrombosis involving the left cephalic vein at the level of the forearm.  *See table(s) above for measurements and observations.  Diagnosing physician: Orlie Pollen Electronically signed by Orlie Pollen on 01/13/2021 at 3:49:35 PM.    Final         Scheduled Meds:  calcium-vitamin D  1 tablet Oral BID WC   Chlorhexidine Gluconate Cloth  6 each Topical Daily   enoxaparin (LOVENOX) injection  40 mg Subcutaneous Q24H   gabapentin  300 mg Oral BID   insulin aspart  0-9 Units  Subcutaneous TID WC   levothyroxine  150 mcg Oral QAC breakfast   pantoprazole  40 mg Oral Daily   pramipexole  1.5 mg Oral TID   QUEtiapine  25 mg Oral QHS   sodium chloride flush  10-40 mL Intracatheter Q12H   sodium chloride  1 g Oral BID WC   Continuous Infusions:  calcium gluconate     ceFEPime (MAXIPIME) IV     magnesium sulfate bolus IVPB       LOS: 2 days   Time spent= 35 mins    Khaiden Segreto Arsenio Loader, MD [facility]  If 7PM-7AM, please contact night-coverage  01/14/2021, 8:06 AM

## 2021-01-14 NOTE — Plan of Care (Signed)
?  Problem: Elimination: ?Goal: Will not experience complications related to bowel motility ?Outcome: Progressing ?  ?Problem: Pain Managment: ?Goal: General experience of comfort will improve ?Outcome: Progressing ?  ?Problem: Safety: ?Goal: Ability to remain free from injury will improve ?Outcome: Progressing ?  ?

## 2021-01-14 NOTE — Progress Notes (Signed)
Pharmacy Antibiotic Note  Jillian Leblanc is a 75 y.o. female admitted on 01/12/2021 with  Sepsis secondary to unknown source Pharmacy has been consulted for Cefepime dosing.  Scr has improved to 0.68, CrCL >60, will adjust dose to full dose  Plan: -Increase Cefepime 2 gm IV Q 8 hours  -Monitor CBC, renal fx, cultures and clinical progress   Height: 5' (152.4 cm) Weight: 84.3 kg (185 lb 12.8 oz) IBW/kg (Calculated) : 45.5  Temp (24hrs), Avg:99.4 F (37.4 C), Min:98.1 F (36.7 C), Max:100.2 F (37.9 C)  Recent Labs  Lab 01/08/21 0111 01/12/21 1902 01/12/21 1907 01/12/21 2107 01/13/21 0036 01/13/21 0808 01/14/21 0256  WBC  --   --  7.6  --   --   --  7.1  CREATININE 0.75  --  0.89  --  0.73 0.73 0.68  LATICACIDVEN  --  1.8  --  1.2  --   --   --      Estimated Creatinine Clearance: 60.3 mL/min (by C-G formula based on SCr of 0.68 mg/dL).    Allergies  Allergen Reactions   Pentosan Polysulfate Sodium Other (See Comments)    "ELM[facility]ON [facility] Drug hair fell out 10/21/2009 12:00:00 AM by Johny Blamer CNA 1"   Elmiron [Pentosan Polysulfate] Other (See Comments)    "Hair fell out"   Naloxone Other (See Comments)    Hallucinations/Confusion/Nightmares    Antimicrobials this admission: Cefepime 12/20 >>  Vancomycin 12/20 >>12/21   Dose adjustments this admission: Increase cefepime to q8h  Microbiology results: 12/20 BCx >>NGTD 12/20 UCx >> NGTD   Jillian Leblanc A. Jeanella Craze, PharmD, BCPS, FNKF Clinical Pharmacist [place] Please utilize Amion for appropriate phone number to reach the unit pharmacist [facility])

## 2021-01-15 DIAGNOSIS — I480 Paroxysmal atrial fibrillation: Secondary | ICD-10-CM

## 2021-01-15 LAB — GLUCOSE, CAPILLARY
Glucose-Capillary: 122 mg/dL — ABNORMAL HIGH (ref 70–99)
Glucose-Capillary: 102 mg/dL — ABNORMAL HIGH (ref 70–99)
Glucose-Capillary: 87 mg/dL (ref 70–99)
Glucose-Capillary: 146 mg/dL — ABNORMAL HIGH (ref 70–99)

## 2021-01-15 LAB — BASIC METABOLIC PANEL
Anion gap: 5 (ref 5–15)
BUN: 8 mg/dL (ref 8–23)
CO2: 25 mmol/L (ref 22–32)
Calcium: 6.5 mg/dL — ABNORMAL LOW (ref 8.9–10.3)
Chloride: 98 mmol/L (ref 98–111)
Creatinine, Ser: 0.72 mg/dL (ref 0.44–1.00)
GFR, Estimated: >60 mL/min (ref >60)
Glucose, Bld: 114 mg/dL — ABNORMAL HIGH (ref 70–99)
Potassium: 3.5 mmol/L (ref 3.5–5.1)
Sodium: 128 mmol/L — ABNORMAL LOW (ref 135–145)

## 2021-01-15 LAB — CBC
HCT: 22.7 % — ABNORMAL LOW (ref 36.0–46.0)
Hemoglobin: 7.4 g/dL — ABNORMAL LOW (ref 12.0–15.0)
MCH: 30 pg (ref 26.0–34.0)
MCHC: 32.6 g/dL (ref 30.0–36.0)
MCV: 91.9 fL (ref 80.0–100.0)
Platelets: 287 10*3/uL (ref 150–400)
RBC: 2.47 MIL/uL — ABNORMAL LOW (ref 3.87–5.11)
RDW: 17.3 % — ABNORMAL HIGH (ref 11.5–15.5)
WBC: 6.6 10*3/uL (ref 4.0–10.5)
nRBC: 0 % (ref 0.0–0.2)

## 2021-01-15 LAB — URINE CULTURE: Culture: >=100000 — AB

## 2021-01-15 LAB — MAGNESIUM: Magnesium: 1.9 mg/dL (ref 1.7–2.4)

## 2021-01-15 MED ORDER — SODIUM CHLORIDE 1 G PO TABS
1.0000 g | ORAL_TABLET | Freq: Two times a day (BID) | ORAL | Status: DC
  Administered 2021-01-15 – 2021-01-22 (×14): 1 g via ORAL
  Filled 2021-01-15 (×13): qty 1

## 2021-01-15 MED ORDER — FOLIC ACID 1 MG PO TABS
1.0000 mg | ORAL_TABLET | Freq: Every day | ORAL | Status: DC
  Administered 2021-01-16 – 2021-01-22 (×7): 1 mg via ORAL
  Filled 2021-01-15 (×7): qty 1

## 2021-01-15 MED ORDER — VITAMIN B-12 1000 MCG PO TABS
1000.0000 ug | ORAL_TABLET | Freq: Every day | ORAL | Status: DC
  Administered 2021-01-16 – 2021-01-22 (×7): 1000 ug via ORAL
  Filled 2021-01-15 (×7): qty 1

## 2021-01-15 NOTE — Evaluation (Signed)
Occupational Therapy Evaluation Patient Details Name: Jillian Leblanc MRN: [id number] DOB: 05/19/1945 Today's Date: 01/15/2021   History of Present Illness pt is a 75 y/o female admitted 12/20 from SNF for recent septic shock from [facility] bacteremia L4-S1 osteo with epidural abscess, s/p L45 lami and IV antibx.  Pt now admitted with AMS found to be due to UTI/Cauti.  PMHx:  DM2, HFpEF   Clinical Impression   Pt admitted for concerns listed above. PTA pt reported independence with all ADL's and IADL's, however, later in the session, pt reported that she could not walk and did not know how she took herself to the bathroom, etc. From chart, pt was ambulating with therapy in October on last admission. At this time, pt presents with increased confusion, weakness, difficulty problem solving/following simple commands, and poor balance. She is unable to come to standing at this time, with difficulty attempting to use RW to assist with transfer, requiring face-to-face hand held assist. Recommending SNF level therapies to maximize her independence and acute OT will follow to address concerns listed above.       Recommendations for follow up therapy are one component of a multi-disciplinary discharge planning process, led by the attending physician.  Recommendations may be updated based on patient status, additional functional criteria and insurance authorization.   Follow Up Recommendations  Skilled nursing-short term rehab (<3 hours/day)    Assistance Recommended at Discharge Frequent or constant Supervision/Assistance  Functional Status Assessment  Patient has had a recent decline in their functional status and demonstrates the ability to make significant improvements in function in a reasonable and predictable amount of time.  Equipment Recommendations  None recommended by OT    Recommendations for Other Services       Precautions / Restrictions Precautions Precautions: Fall;Back Required Braces  or Orthoses: Spinal Brace Spinal Brace: Thoracolumbosacral orthotic Restrictions Weight Bearing Restrictions: No      Mobility Bed Mobility Overal bed mobility: Needs Assistance Bed Mobility: Rolling;Sidelying to Sit;Sit to Sidelying Rolling: Mod assist Sidelying to sit: Max assist (+2 helpful)     Sit to sidelying: Max assist General bed mobility comments: assist for all aspects, assist roll until pt grabbed the rail, then significant truncal assist up to sitting.    Transfers Overall transfer level: Needs assistance   Transfers: Sit to/from Stand Sit to Stand: Max assist           General transfer comment: Due to cognition, pt unable to use RW, needed HHA to attempt to stand, unable to come upright fully.      Balance Overall balance assessment: Needs assistance Sitting-balance support: Bilateral upper extremity supported;Feet supported Sitting balance-Leahy Scale: Poor Sitting balance - Comments: UE support and supervision for static sitting   Standing balance support: Bilateral upper extremity supported Standing balance-Leahy Scale: Poor Standing balance comment: reliant on external support                           ADL either performed or assessed with clinical judgement   ADL Overall ADL's : Needs assistance/impaired Eating/Feeding: Set up;Sitting   Grooming: Set up;Sitting   Upper Body Bathing: Minimal assistance;Sitting   Lower Body Bathing: Maximal assistance;+2 for safety/equipment;+2 for physical assistance;Sitting/lateral leans;Bed level   Upper Body Dressing : Min guard;Sitting   Lower Body Dressing: Maximal assistance;+2 for physical assistance;+2 for safety/equipment;Sitting/lateral leans;Bed level                 General ADL Comments:  Pt remained bed level due to weakenss.     Vision Baseline Vision/History: 1 Wears glasses Ability to See in Adequate Light: 0 Adequate Patient Visual Report: No change from  baseline Vision Assessment?: No apparent visual deficits     Perception     Praxis      Pertinent Vitals/Pain Pain Assessment: No/denies pain     Hand Dominance Right   Extremity/Trunk Assessment Upper Extremity Assessment Upper Extremity Assessment: Generalized weakness;RUE deficits/detail RUE Deficits / Details: Pain and weakness with UE movment RUE: Shoulder pain with ROM RUE Sensation: decreased light touch RUE Coordination: decreased gross motor;decreased fine motor   Lower Extremity Assessment Lower Extremity Assessment: Defer to PT evaluation   Cervical / Trunk Assessment Cervical / Trunk Assessment: Kyphotic   Communication Communication Communication: No difficulties   Cognition Arousal/Alertness: Awake/alert Behavior During Therapy: Anxious Overall Cognitive Status: No family/caregiver present to determine baseline cognitive functioning (NT formally, pt not able to relate a clear timeline of events.)                                 General Comments: Cognition appearing to wax and wane throughout session. Did not remeber breakfast.     General Comments  VSS on RA    Exercises     Shoulder Instructions      Home Living Family/patient expects to be discharged to:: Skilled nursing facility Living Arrangements: Spouse/significant other Available Help at Discharge: Family Type of Home: House Home Access: Stairs to enter [employer] of Steps: 1 Entrance Stairs-Rails: Right Home Layout: Two level;Full bath on main level;Able to live on main level with bedroom/bathroom     Bathroom Shower/Tub: Walk-in shower;Tub only   Bathroom Toilet: Handicapped height Bathroom Accessibility: Yes How Accessible: Accessible via walker Home Equipment: Girard (2 wheels);Cane - quad;Grab bars - toilet;Grab bars - tub/shower;Wheelchair - manual;BSC/3in1;Shower seat          Prior Functioning/Environment Prior Level of Function : History  of Falls (last six months);Needs assist             Mobility Comments: h/o falls, uses RW vs w/c -reports use of DME new for pt, limited household ambulator ADLs Comments: reports typically able to complete ADLs though increasing difficulty due to falls; assist with IADLs        OT Problem List: Decreased strength;Impaired balance (sitting and/or standing);Decreased knowledge of use of DME or AE;Decreased knowledge of precautions;Pain;Decreased activity tolerance;Decreased safety awareness      OT Treatment/Interventions: Self-care/ADL training;Therapeutic exercise;DME and/or AE instruction;Energy conservation;Therapeutic activities;Cognitive remediation/compensation;Patient/family education;Balance training    OT Goals(Current goals can be found in the care plan section) Acute Rehab OT Goals Patient Stated Goal: To get stronger OT Goal Formulation: With patient Time For Goal Achievement: 01/29/21 Potential to Achieve Goals: Good ADL Goals Pt Will Perform Grooming: with supervision;standing Pt Will Perform Lower Body Bathing: with min guard assist;sitting/lateral leans;sit to/from stand Pt Will Perform Lower Body Dressing: with min guard assist;sitting/lateral leans;sit to/from stand Pt Will Transfer to Toilet: with min assist;ambulating Pt Will Perform Toileting - Clothing Manipulation and hygiene: with min assist;sitting/lateral leans;sit to/from stand Additional ADL Goal #1: Pt will recall 3/3 spinal precautions.  OT Frequency: Min 2X/week   Barriers to D/C:            Co-evaluation              AM-PAC OT "6 Clicks" Daily Activity  Outcome Measure Help from another person eating meals?: A Little Help from another person taking care of personal grooming?: A Little Help from another person toileting, which includes using toliet, bedpan, or urinal?: A Lot Help from another person bathing (including washing, rinsing, drying)?: A Lot Help from another person to put  on and taking off regular upper body clothing?: A Little Help from another person to put on and taking off regular lower body clothing?: A Lot 6 Click Score: 15   End of Session Equipment Utilized During Treatment: Gait belt Nurse Communication: Mobility status  Activity Tolerance: Patient limited by fatigue Patient left: in bed;with call bell/phone within reach;with bed alarm set  OT Visit Diagnosis: Unsteadiness on feet (R26.81);Muscle weakness (generalized) (M62.81);Repeated falls (R29.6)                Time: 9163-8466 OT Time Calculation (min): 20 min Charges:  OT General Charges $OT Visit: 1 Visit OT Evaluation $OT Eval Moderate Complexity: 1 Mod  Savannha Welle H., OTR/L Acute Rehabilitation  Rohaan Durnil Elane Sheneika Walstad 01/15/2021, 1:28 PM

## 2021-01-15 NOTE — Progress Notes (Addendum)
Physical Therapy Treatment Patient Details Name: Jillian Leblanc MRN: [id number] DOB: February 13, 1945 Today's Date: 01/15/2021   History of Present Illness pt is a 75 y/o female admitted 12/20 from SNF for recent septic shock from [facility] bacteremia L4-S1 osteo with epidural abscess, s/p L45 lami and IV antibx.  Pt now admitted with AMS found to be due to UTI/Cauti.  PMHx:  DM2, HFpEF    PT Comments    Pt tolerates treatment well, performing 2 transfers with PT assistance. Pt remains weak, requiring significant physical assistance to perform all functional mobility. Pt with posterior lean throughout session, needing verbal and tactile cues to facilitate more neutral positioning to aide in standing balance. Pt will benefit from continued aggressive mobilization to reduce falls risk and caregiver burden. Pt and daughter express the desire to potentially return home for a brief period of time. If the patient decides to discharge home she will benefit from a hospital bed and a hoyer lift, to aide in pt and caregiver safety when transferring.   Recommendations for follow up therapy are one component of a multi-disciplinary discharge planning process, led by the attending physician.  Recommendations may be updated based on patient status, additional functional criteria and insurance authorization.  Follow Up Recommendations  Skilled nursing-short term rehab (<3 hours/day) (HHPT if patient and family decline SNF)     Assistance Recommended at Discharge Intermittent Supervision/Assistance  Equipment Recommendations  Hospital bed;Other (comment) (hoyer lift. Pt owns a wheelchair)    Recommendations for Other Services       Precautions / Restrictions Precautions Precautions: Fall;Back Precaution Booklet Issued: No Precaution Comments: Back for comfort Required Braces or Orthoses: Spinal Brace Spinal Brace: Thoracolumbosacral orthotic Restrictions Weight Bearing Restrictions: No     Mobility  Bed  Mobility Overal bed mobility: Needs Assistance Bed Mobility: Supine to Sit;Sit to Supine Rolling: Mod assist Sidelying to sit: Max assist (+2 helpful) Supine to sit: Max assist;HOB elevated Sit to supine: Total assist Sit to sidelying: Max assist General bed mobility comments: assist for all aspects, assist roll until pt grabbed the rail, then significant truncal assist up to sitting.    Transfers Overall transfer level: Needs assistance Equipment used: 1 person hand held assist Transfers: Sit to/from Stand Sit to Stand: Max assist           General transfer comment: pt with posterior lean, PT provides knee block and BUE support. Pt performs 2 sit to stands, standing for ~15 seconds and ~20-30 seconds    Ambulation/Gait                   Stairs             Wheelchair Mobility    Modified Rankin (Stroke Patients Only)       Balance Overall balance assessment: Needs assistance Sitting-balance support: Single extremity supported;Bilateral upper extremity supported;Feet supported Sitting balance-Leahy Scale: Poor Sitting balance - Comments: reliant on UE support of bed Postural control: Posterior lean Standing balance support: Bilateral upper extremity supported Standing balance-Leahy Scale: Poor Standing balance comment: maxA, posterior lean                            Cognition Arousal/Alertness: Awake/alert Behavior During Therapy: Anxious Overall Cognitive Status: Impaired/Different from baseline Area of Impairment: Memory  General Comments: pt with impaired recall of events of this hospital stay, does not recall OT session from this morning        Exercises      General Comments General comments (skin integrity, edema, etc.): VSS on 2L [place]      Pertinent Vitals/Pain Pain Assessment: Faces Faces Pain Scale: Hurts little more Pain Location: generalized Pain Descriptors / Indicators:  Grimacing Pain Intervention(s): Monitored during session    Home Living Family/patient expects to be discharged to:: Skilled nursing facility Living Arrangements: Spouse/significant other Available Help at Discharge: Family Type of Home: House Home Access: Stairs to enter Entrance Stairs-Rails: Right Entrance Stairs-Number of Steps: 1   Home Layout: Two level;Full bath on main level;Able to live on main level with bedroom/bathroom Home Equipment: Rolling Walker (2 wheels);Cane - quad;Grab bars - toilet;Grab bars - tub/shower;Wheelchair - manual;BSC/3in1;Shower seat      Prior Function            PT Goals (current goals can now be found in the care plan section) Acute Rehab PT Goals Patient Stated Goal: Home for Christmas Progress towards PT goals: Progressing toward goals    Frequency    Min 3X/week      PT Plan Current plan remains appropriate    Co-evaluation              AM-PAC PT "6 Clicks" Mobility   Outcome Measure  Help needed turning from your back to your side while in a flat bed without using bedrails?: A Lot Help needed moving from lying on your back to sitting on the side of a flat bed without using bedrails?: A Lot Help needed moving to and from a bed to a chair (including a wheelchair)?: Total Help needed standing up from a chair using your arms (e.g., wheelchair or bedside chair)?: A Lot Help needed to walk in hospital room?: Total Help needed climbing 3-5 steps with a railing? : Total 6 Click Score: 9    End of Session Equipment Utilized During Treatment: Back brace Activity Tolerance: Patient tolerated treatment well Patient left: in bed;with call bell/phone within reach;with bed alarm set;with family/visitor present Nurse Communication: Mobility status;Need for lift equipment PT Visit Diagnosis: Other abnormalities of gait and mobility (R26.89);Muscle weakness (generalized) (M62.81) Pain - Right/Left: Left Pain - part of body: Hip      Time: [id number] PT Time Calculation (min) (ACUTE ONLY): 32 min  Charges:  $Therapeutic Activity: 23-37 mins                     Zenaida Niece, PT, DPT Acute Rehabilitation Pager: [phone] Office [phone]    Zenaida Niece 01/15/2021, 4:55 PM

## 2021-01-15 NOTE — Progress Notes (Signed)
[name] confused and taking off tele box. MD was informed and state to discontinue tele.

## 2021-01-15 NOTE — Progress Notes (Addendum)
PROGRESS NOTE    Jillian Leblanc  [id number] DOB: 01-Jun-1945 DOA: 01/12/2021 PCP: Guadlupe Spanish, MD   Brief Narrative:  75 year old with history of DM2, PAF , not on anticoagulation due to anemia per last discharge summary, sinus pause status post pacemaker, CHF with preserved EF, hypothyroidism admitted for altered mental status.  Patient was discharged 5 days ago after being treated for E. coli bacteremia, lumbar osteomyelitis with epidural abscess underwent laminectomy with evacuation of abscess.  Discharged on IV Ancef until 03/01/2021 by ID.  Also had urinary retention therefore was discharged on Foley catheter.  At SNF patient became confused having auditory and visual hallucination therefore brought to the hospital and was found to have sepsis secondary to UTI/CAUTI and multiple electrolyte imbalance.   Assessment & Plan:   Principal Problem:   Sepsis ([place]) Active Problems:   Hyponatremia   Hypokalemia   Elevated troponin   Hypothyroidism   Acute metabolic encephalopathy   Chronic diastolic CHF (congestive heart failure) (HCC)   Hypocalcemia   Hypomagnesemia   Sepsis secondary to UTI ([place])   Acute urinary retention   Urosepsis secondary to indwelling catheter; CAUTI -Blood Cultures- No growth. Urine Cx positive for Pseudomonas, empiric antibiotics- Cefepime, plan for total 7 days treatment -Foley exchanged on 12/21 in ED    Acute metabolic encephalopathy secondary to UTI w/indwelling catheter  -Mentation improved.    Elevated troponin  -Demand ischemia, remained flat.   Hyponatremia -seen by nephrology during recent hospitalization, appear responded to iv lasix/salt tabs and iv albumin , fluids restriction -urine sodium was 13 , resume salt tabs, fluid restriction -Close monitor volume status, Monitor oral intake, may need IV Lasix/LV albumin   Hypokalemia/hypomagnesemia/hypocalcemia -Aggressive repletion.  Repeat in the morning,  replace as needed   Left UE  edema  -Venous Dopplers- SVT of the left arm, but no DVT.    History of recent E.coli bacteremia epidural abscess and laminectomy -Status post L4-L5 laminectomy with evacuation of epidural abscess on 12/12 with neurosurgery  - No active infection to healing surgical wound - Patient was placed on IV Ancef until 03/01/2021 by ID.Currently on IV cefepime for UTI, plan transition back to Ancef after treating UTI Picc line placed on 12/15    History of urinary retention -Foley catheter was placed prior to her discharge on 12/15.   -foley exchanged on 12/21  -Need outpatient urology follow-up   chronic diastolic heart failure Dehydrated, hold lasix. Continue gentle hydration for another 24 hrs. now off hydration Likely will have intravascular depletion due to low albumin Close monitor volume status  Recently diagnosed PAF, was seen by cardiology last admission, anticoagulation issue to be addressed upon outpatient follow-up with cardiology  Anemia of chronic disease -Baseline hemoglobin 8.0.  No obvious signs of bleeding. -check FOBT   Type 2 diabetes Last hemoglobin A1c of 8.4 in October -Sliding scale Accu-Cheks   Hypothyroidism Continue levothyroxine      DVT prophylaxis: Lovenox  Code Status: Full code Family Communication: I updated Daughter  over the phone on 12/23  Status is: Inpatient  Remains inpatient appropriate because: Maintain hosp stay for further IV electrolyte repetion. Continue IV Cefepime while awaiting futher culture data. PT/OT therefore after.   Return to snf once mental status and sodium improves        Body mass index is 36.29 kg/m.  Pressure Injury 12/19/20 Sacrum Medial Stage 2 -  Partial thickness loss of dermis presenting as a shallow open injury with a red, pink wound  bed without slough. stage 2  sacrum  4cm x .5cm (Active)  12/19/20 1640  Location: Sacrum  Location Orientation: Medial  Staging: Stage 2 -  Partial thickness loss of dermis  presenting as a shallow open injury with a red, pink wound bed without slough.  Wound Description (Comments): stage 2  sacrum  4cm x .5cm  Present on Admission: Yes      Subjective: Feeling better. No acute events overnight.  Denies pain, still confused not able to provide reliable history, but able to oriented x3 this am   Examination:  Constitutional: Not in acute distress, pleasantly confused, indwelling Foley Respiratory: Clear to auscultation bilaterally Cardiovascular: Normal sinus rhythm, no rubs Abdomen: Nontender nondistended good bowel sounds Musculoskeletal: No edema noted Skin: No rashes seen Neurologic: CN 2-12 grossly intact.  And nonfocal Psychiatric: Normal judgment and insight. Alert and oriented x 3. Normal mood.  Foley in place.   Objective: Vitals:   01/14/21 2021 01/15/21 0620 01/15/21 0621 01/15/21 1046  BP: (!) 154/82  102/67 (!) 120/50  Pulse: 76 91 88 81  Resp: 18 18  16   Temp: 97.9 F (36.6 C)   99.9 F (37.7 C)  TempSrc: Oral   Axillary  SpO2: 100% 98% 98% 99%  Weight:      Height:        Intake/Output Summary (Last 24 hours) at 01/15/2021 1607 Last data filed at 01/15/2021 1400 Gross per 24 hour  Intake 1407.75 ml  Output 1500 ml  Net -92.25 ml   Filed Weights   01/12/21 1838  Weight: 84.3 kg     Data Reviewed:   CBC: Recent Labs  Lab 01/12/21 1907 01/14/21 0256 01/15/21 0510  WBC 7.6 7.1 6.6  NEUTROABS 5.0  --   --   HGB 8.7* 7.5* 7.4*  HCT 26.9* 22.9* 22.7*  MCV 88.8 89.5 91.9  PLT 208 258 287   Basic Metabolic Panel: Recent Labs  Lab 01/12/21 1907 01/12/21 2107 01/13/21 0036 01/13/21 0459 01/13/21 0808 01/14/21 0256 01/15/21 0510  NA 129*  --  128*  --  128* 129* 128*  K 2.3*  --  2.5*  --  3.4* 4.1 3.5  CL 88*  --  91*  --  92* 97* 98  CO2 30  --  28  --  25 24 25   GLUCOSE 131*  --  132*  --  129* 131* 114*  BUN 11  --  11  --  11 10 8   CREATININE 0.89  --  0.73  --  0.73 0.68 0.72  CALCIUM 6.0*  --   6.3*  --  6.5* 6.4* 6.5*  MG  --  1.1*  --  1.8  --  1.5* 1.9  PHOS  --   --   --   --  3.2 5.0*  --    GFR: Estimated Creatinine Clearance: 60.3 mL/min (by C-G formula based on SCr of 0.72 mg/dL). Liver Function Tests: Recent Labs  Lab 01/12/21 1907  AST 23  ALT 6  ALKPHOS 189*  BILITOT 1.1  PROT 6.1*  ALBUMIN 2.1*   No results for input(s): LIPASE, AMYLASE in the last 168 hours. No results for input(s): AMMONIA in the last 168 hours.  Coagulation Profile: Recent Labs  Lab 01/12/21 1907  INR 1.3*   Cardiac Enzymes: No results for input(s): CKTOTAL, CKMB, CKMBINDEX, TROPONINI in the last 168 hours. BNP (last 3 results) No results for input(s): PROBNP in the last 8760 hours. HbA1C: No results  for input(s): HGBA1C in the last 72 hours. CBG: Recent Labs  Lab 01/14/21 1112 01/14/21 1708 01/14/21 2102 01/15/21 0619 01/15/21 1121  GLUCAP 93 128* 164* 122* 102*   Lipid Profile: No results for input(s): CHOL, HDL, LDLCALC, TRIG, CHOLHDL, LDLDIRECT in the last 72 hours. Thyroid Function Tests: No results for input(s): TSH, T4TOTAL, FREET4, T3FREE, THYROIDAB in the last 72 hours. Anemia Panel: No results for input(s): VITAMINB12, FOLATE, FERRITIN, TIBC, IRON, RETICCTPCT in the last 72 hours. Sepsis Labs: Recent Labs  Lab 01/12/21 1902 01/12/21 2107  LATICACIDVEN 1.8 1.2    Recent Results (from the past 240 hour(s))  Culture, blood (routine x 2)     Status: None   Collection Time: 01/06/21  8:46 AM   Specimen: BLOOD LEFT HAND  Result Value Ref Range Status   Specimen Description BLOOD LEFT HAND  Final   Special Requests   Final    BOTTLES DRAWN AEROBIC ONLY Blood Culture adequate volume   Culture   Final    NO GROWTH 5 DAYS Performed at [facility], [address], [place], [place] [address]    Report Status 01/11/2021 FINAL  Final  Culture, blood (routine x 2)     Status: None   Collection Time: 01/06/21  8:47 AM   Specimen: BLOOD RIGHT HAND   Result Value Ref Range Status   Specimen Description BLOOD RIGHT HAND  Final   Special Requests   Final    BOTTLES DRAWN AEROBIC ONLY Blood Culture adequate volume   Culture   Final    NO GROWTH 5 DAYS Performed at [facility], [place] [address], [place], [place] [address]    Report Status 01/11/2021 FINAL  Final  Urine Culture     Status: Abnormal   Collection Time: 01/06/21  2:24 PM   Specimen: Urine, Catheterized  Result Value Ref Range Status   Specimen Description URINE, CATHETERIZED  Final   Special Requests   Final    NONE Performed at [facility], [place] [address] [place], [place] [address]    Culture >=100,000 COLONIES/mL PSEUDOMONAS AERUGINOSA (A)  Final   Report Status 01/08/2021 FINAL  Final   Organism ID, Bacteria PSEUDOMONAS AERUGINOSA (A)  Final      Susceptibility   Pseudomonas aeruginosa - MIC*    CEFTAZIDIME 4 SENSITIVE Sensitive     CIPROFLOXACIN <=0.25 SENSITIVE Sensitive     GENTAMICIN <=1 SENSITIVE Sensitive     IMIPENEM 2 SENSITIVE Sensitive     PIP/TAZO 8 SENSITIVE Sensitive     CEFEPIME 2 SENSITIVE Sensitive     * >=100,000 COLONIES/mL PSEUDOMONAS AERUGINOSA  Resp Panel by RT-PCR (Flu A&B, Covid) Nasopharyngeal Swab     Status: None   Collection Time: 01/07/21  1:48 PM   Specimen: Nasopharyngeal Swab; Nasopharyngeal(NP) swabs in vial transport medium  Result Value Ref Range Status   SARS Coronavirus 2 by RT PCR NEGATIVE NEGATIVE Final    Comment: (NOTE) SARS-CoV-2 target nucleic acids are NOT DETECTED.  The SARS-CoV-2 RNA is generally detectable in upper respiratory specimens during the acute phase of infection. The lowest concentration of SARS-CoV-2 viral copies this assay can detect is 138 copies/mL. A negative result does not preclude SARS-Cov-2 infection and should not be used as the sole basis for treatment or other patient management decisions. A negative result may occur with  improper specimen collection/handling, submission of  specimen other than nasopharyngeal swab, presence of viral mutation(s) within the areas targeted by this  assay, and inadequate number of viral copies(<138 copies/mL). A negative result must be combined with clinical observations, patient history, and epidemiological information. The expected result is Negative.  Fact Sheet for Patients:  EntrepreneurPulse.com.au  Fact Sheet for Healthcare Providers:  IncredibleEmployment.be  This test is no t yet approved or cleared by the [place] FDA and  has been authorized for detection and/or diagnosis of SARS-CoV-2 by FDA under an Emergency Use Authorization (EUA). This EUA will remain  in effect (meaning this test can be used) for the duration of the COVID-19 declaration under Section 564(b)(1) of the Act, 21 U.S.C.section 360bbb-3(b)(1), unless the authorization is terminated  or revoked sooner.       Influenza A by PCR NEGATIVE NEGATIVE Final   Influenza B by PCR NEGATIVE NEGATIVE Final    Comment: (NOTE) The Xpert Xpress SARS-CoV-2/FLU/RSV plus assay is intended as an aid in the diagnosis of influenza from Nasopharyngeal swab specimens and should not be used as a sole basis for treatment. Nasal washings and aspirates are unacceptable for Xpert Xpress SARS-CoV-2/FLU/RSV testing.  Fact Sheet for Patients: EntrepreneurPulse.com.au  Fact Sheet for Healthcare Providers: IncredibleEmployment.be  This test is not yet approved or cleared by the [place] FDA and has been authorized for detection and/or diagnosis of SARS-CoV-2 by FDA under an Emergency Use Authorization (EUA). This EUA will remain in effect (meaning this test can be used) for the duration of the COVID-19 declaration under Section 564(b)(1) of the Act, 21 U.S.C. section 360bbb-3(b)(1), unless the authorization is terminated or revoked.  Performed at [facility], [place] [address],  [place], [place] [address]   Urine Culture     Status: Abnormal   Collection Time: 01/12/21  1:00 PM   Specimen: In/Out Cath Urine  Result Value Ref Range Status   Specimen Description IN/OUT CATH URINE  Final   Special Requests   Final    NONE Performed at [facility], [place] [address], [place], [place] [address]    Culture >=100,000 COLONIES/mL PSEUDOMONAS AERUGINOSA (A)  Final   Report Status 01/15/2021 FINAL  Final   Organism ID, Bacteria PSEUDOMONAS AERUGINOSA (A)  Final      Susceptibility   Pseudomonas aeruginosa - MIC*    CEFTAZIDIME 2 SENSITIVE Sensitive     CIPROFLOXACIN <=0.25 SENSITIVE Sensitive     GENTAMICIN <=1 SENSITIVE Sensitive     IMIPENEM 2 SENSITIVE Sensitive     PIP/TAZO <=4 SENSITIVE Sensitive     CEFEPIME 2 SENSITIVE Sensitive     * >=100,000 COLONIES/mL PSEUDOMONAS AERUGINOSA  Resp Panel by RT-PCR (Flu A&B, Covid)     Status: None   Collection Time: 01/12/21  7:07 PM   Specimen: Nasopharyngeal(NP) swabs in vial transport medium  Result Value Ref Range Status   SARS Coronavirus 2 by RT PCR NEGATIVE NEGATIVE Final    Comment: (NOTE) SARS-CoV-2 target nucleic acids are NOT DETECTED.  The SARS-CoV-2 RNA is generally detectable in upper respiratory specimens during the acute phase of infection. The lowest concentration of SARS-CoV-2 viral copies this assay can detect is 138 copies/mL. A negative result does not preclude SARS-Cov-2 infection and should not be used as the sole basis for treatment or other patient management decisions. A negative result may occur with  improper specimen collection/handling, submission of specimen other than nasopharyngeal swab, presence of viral mutation(s) within the areas targeted by this assay, and inadequate number of viral copies(<138 copies/mL). A negative result must be combined with clinical observations, patient history,  and epidemiological information. The expected result is Negative.  Fact Sheet for Patients:   EntrepreneurPulse.com.au  Fact Sheet for Healthcare Providers:  IncredibleEmployment.be  This test is no t yet approved or cleared by the [place] FDA and  has been authorized for detection and/or diagnosis of SARS-CoV-2 by FDA under an Emergency Use Authorization (EUA). This EUA will remain  in effect (meaning this test can be used) for the duration of the COVID-19 declaration under Section 564(b)(1) of the Act, 21 U.S.C.section 360bbb-3(b)(1), unless the authorization is terminated  or revoked sooner.       Influenza A by PCR NEGATIVE NEGATIVE Final   Influenza B by PCR NEGATIVE NEGATIVE Final    Comment: (NOTE) The Xpert Xpress SARS-CoV-2/FLU/RSV plus assay is intended as an aid in the diagnosis of influenza from Nasopharyngeal swab specimens and should not be used as a sole basis for treatment. Nasal washings and aspirates are unacceptable for Xpert Xpress SARS-CoV-2/FLU/RSV testing.  Fact Sheet for Patients: EntrepreneurPulse.com.au  Fact Sheet for Healthcare Providers: IncredibleEmployment.be  This test is not yet approved or cleared by the [place] FDA and has been authorized for detection and/or diagnosis of SARS-CoV-2 by FDA under an Emergency Use Authorization (EUA). This EUA will remain in effect (meaning this test can be used) for the duration of the COVID-19 declaration under Section 564(b)(1) of the Act, 21 U.S.C. section 360bbb-3(b)(1), unless the authorization is terminated or revoked.  Performed at [facility], [place] [address], [place], [place] [address]   Blood Culture (routine x 2)     Status: None (Preliminary result)   Collection Time: 01/12/21  7:07 PM   Specimen: BLOOD  Result Value Ref Range Status   Specimen Description BLOOD LEFT ANTECUBITAL  Final   Special Requests   Final    BOTTLES DRAWN AEROBIC AND ANAEROBIC Blood Culture adequate volume   Culture    Final    NO GROWTH 3 DAYS Performed at [facility], [place] [address], [place], [place] [address]    Report Status PENDING  Incomplete  Blood Culture (routine x 2)     Status: None (Preliminary result)   Collection Time: 01/13/21  8:08 AM   Specimen: BLOOD  Result Value Ref Range Status   Specimen Description BLOOD LEFT ANTECUBITAL  Final   Special Requests   Final    BOTTLES DRAWN AEROBIC AND ANAEROBIC Blood Culture adequate volume   Culture   Final    NO GROWTH 2 DAYS Performed at [facility], [place] [address], [place],  [address]    Report Status PENDING  Incomplete         Radiology Studies: No results found.      Scheduled Meds:  calcium-vitamin D  1 tablet Oral BID WC   Chlorhexidine Gluconate Cloth  6 each Topical Daily   enoxaparin (LOVENOX) injection  40 mg Subcutaneous Q24H   gabapentin  300 mg Oral BID   insulin aspart  0-9 Units Subcutaneous TID WC   levothyroxine  150 mcg Oral QAC breakfast   pantoprazole  40 mg Oral Daily   pramipexole  1.5 mg Oral TID   QUEtiapine  25 mg Oral QHS   sodium chloride flush  10-40 mL Intracatheter Q12H   Continuous Infusions:  ceFEPime (MAXIPIME) IV 2 g (01/15/21 0846)     LOS: 3 days     Florencia Reasons, MD PhD FACP [facility]  If 7PM-7AM, please contact night-coverage  01/15/2021, 4:07 PM

## 2021-01-15 NOTE — Care Management Important Message (Signed)
Important Message  Patient Details  Name: Jillian Leblanc MRN: [id number] Date of Birth: 05/09/1945   Medicare Important Message Given:  Yes     Tobey Schmelzle Stefan Church 01/15/2021, 3:28 PM

## 2021-01-16 LAB — HEPATIC FUNCTION PANEL
ALT: 5 U/L (ref 0–44)
AST: 19 U/L (ref 15–41)
Albumin: 1.7 g/dL — ABNORMAL LOW (ref 3.5–5.0)
Alkaline Phosphatase: 142 U/L — ABNORMAL HIGH (ref 38–126)
Bilirubin, Direct: 0.3 mg/dL — ABNORMAL HIGH (ref 0.0–0.2)
Indirect Bilirubin: 0.6 mg/dL (ref 0.3–0.9)
Total Bilirubin: 0.9 mg/dL (ref 0.3–1.2)
Total Protein: 5.4 g/dL — ABNORMAL LOW (ref 6.5–8.1)

## 2021-01-16 LAB — MAGNESIUM: Magnesium: 1.7 mg/dL (ref 1.7–2.4)

## 2021-01-16 LAB — CBC
HCT: 23.2 % — ABNORMAL LOW (ref 36.0–46.0)
Hemoglobin: 7.3 g/dL — ABNORMAL LOW (ref 12.0–15.0)
MCH: 29.1 pg (ref 26.0–34.0)
MCHC: 31.5 g/dL (ref 30.0–36.0)
MCV: 92.4 fL (ref 80.0–100.0)
Platelets: 300 10*3/uL (ref 150–400)
RBC: 2.51 MIL/uL — ABNORMAL LOW (ref 3.87–5.11)
RDW: 17.1 % — ABNORMAL HIGH (ref 11.5–15.5)
WBC: 4.6 10*3/uL (ref 4.0–10.5)
nRBC: 0 % (ref 0.0–0.2)

## 2021-01-16 LAB — BASIC METABOLIC PANEL
Anion gap: 6 (ref 5–15)
BUN: 8 mg/dL (ref 8–23)
CO2: 24 mmol/L (ref 22–32)
Calcium: 6.7 mg/dL — ABNORMAL LOW (ref 8.9–10.3)
Chloride: 101 mmol/L (ref 98–111)
Creatinine, Ser: 0.65 mg/dL (ref 0.44–1.00)
GFR, Estimated: >60 mL/min (ref >60)
Glucose, Bld: 127 mg/dL — ABNORMAL HIGH (ref 70–99)
Potassium: 3.3 mmol/L — ABNORMAL LOW (ref 3.5–5.1)
Sodium: 131 mmol/L — ABNORMAL LOW (ref 135–145)

## 2021-01-16 LAB — GLUCOSE, CAPILLARY
Glucose-Capillary: 110 mg/dL — ABNORMAL HIGH (ref 70–99)
Glucose-Capillary: 116 mg/dL — ABNORMAL HIGH (ref 70–99)
Glucose-Capillary: 129 mg/dL — ABNORMAL HIGH (ref 70–99)

## 2021-01-16 LAB — CALCIUM, IONIZED: Calcium, Ionized, Serum: 4 mg/dL — ABNORMAL LOW (ref 4.5–5.6)

## 2021-01-16 LAB — IRON AND TIBC
Iron: 23 ug/dL — ABNORMAL LOW (ref 28–170)
Saturation Ratios: 19 % (ref 10.4–31.8)
TIBC: 123 ug/dL — ABNORMAL LOW (ref 250–450)
UIBC: 100 ug/dL

## 2021-01-16 LAB — VITAMIN B12: Vitamin B-12: 237 pg/mL (ref 180–914)

## 2021-01-16 MED ORDER — MAGNESIUM SULFATE 2 GM/50ML IV SOLN
2.0000 g | Freq: Once | INTRAVENOUS | Status: AC
  Administered 2021-01-16: 2 g via INTRAVENOUS
  Filled 2021-01-16: qty 50

## 2021-01-16 MED ORDER — POTASSIUM CHLORIDE CRYS ER 20 MEQ PO TBCR
40.0000 meq | EXTENDED_RELEASE_TABLET | ORAL | Status: AC
  Administered 2021-01-16 (×2): 40 meq via ORAL
  Filled 2021-01-16 (×2): qty 2

## 2021-01-16 MED ORDER — MORPHINE SULFATE (PF) 2 MG/ML IV SOLN
2.0000 mg | INTRAVENOUS | Status: DC | PRN
  Administered 2021-01-20: 2 mg via INTRAVENOUS
  Filled 2021-01-16 (×2): qty 1

## 2021-01-16 NOTE — Progress Notes (Signed)
°  °  Durable Medical Equipment  (From admission, onward)           Start     Ordered   01/16/21 1115  For home use only DME Hospital bed  Once       Question Answer Comment  Length of Need 6 Months   Patient has (list medical condition): CHF - back surgery   The above medical condition requires: Patient requires the ability to reposition frequently   Head must be elevated greater than: 30 degrees   Bed type Semi-electric   [employer] Yes   Trapeze Bar Yes   Support Surface: Gel Overlay      01/16/21 1116

## 2021-01-16 NOTE — TOC Initial Note (Signed)
Transition of Care [facility]) - Initial/Assessment Note    Patient Details  Name: Jillian Leblanc MRN: [id number] Date of Birth: Feb 04, 1945  [facility] CM/SW Contact:    Carley Hammed, LCSWA Phone Number: 01/16/2021, 10:59 AM  Clinical Narrative:                 CSW spoke with pt at bedside to discuss disposition. Pt states she is declining SNF and would like to return home with [facility]. Pt notes she has assistance at home including her spouse and daughter. She states she has used [place] before and is agreeable to them again. She has a walker and Wheelchair, but states she needs a hospital bed hoyer, and 3 in 1. She is agreeable to [facility] contacting daughter [name] or Mickey Farber for further needs. TOC will continue to follow for DC needs.  Expected Discharge Plan: Home w Home Health Services Barriers to Discharge: Continued Medical Work up, Transportation (DME and [facility] needs)   Patient Goals and CMS Choice Patient states their goals for this hospitalization and ongoing recovery are:: Pt states her goal is to return home. CMS Medicare.gov Compare Post Acute Care list provided to:: Patient Choice offered to / list presented to : Patient  Expected Discharge Plan and Services Expected Discharge Plan: Home w Home Health Services   Discharge Planning Services: CM Consult Post Acute Care Choice: Home Health Living arrangements for the past 2 months: Single Family Home                                      Prior Living Arrangements/Services Living arrangements for the past 2 months: Single Family Home Lives with:: Adult Children, Spouse Patient language and need for interpreter reviewed:: Yes Do you feel safe going back to the place where you live?: Yes      Need for Family Participation in Patient Care: Yes (Comment) Care giver support system in place?: Yes (comment) Current home services: DME Criminal Activity/Legal Involvement Pertinent to Current  Situation/Hospitalization: No - Comment as needed  Activities of Daily Living      Permission Sought/Granted Permission sought to share information with : Family Supports Permission granted to share information with : Yes, Verbal Permission Granted  Share Information with NAME: [name]     Permission granted to share info w Relationship: Daughter  Permission granted to share info w Contact Information: [phone]  Emotional Assessment Appearance:: Appears stated age Attitude/Demeanor/Rapport: Engaged Affect (typically observed): Appropriate Orientation: : Oriented to Self, Oriented to Place, Oriented to  Time, Oriented to Situation Alcohol / Substance Use: Not Applicable Psych Involvement: No (comment)  Admission diagnosis:  Altered mental status [R41.82] Disorientation [R41.0] Sepsis (HCC) [A41.9] Urinary tract infection without hematuria, site unspecified [N39.0] Patient Active Problem List   Diagnosis Date Noted   Hypocalcemia 01/13/2021   Hypomagnesemia 01/13/2021   Sepsis secondary to UTI (HCC) 01/13/2021   Acute urinary retention 01/13/2021   Sepsis (HCC) 01/12/2021   Discitis of lumbar region    Sepsis with acute renal failure without septic shock (HCC)    [place]eze    Pancytopenia (HCC)    Severe sepsis (HCC) 12/19/2020   Acute metabolic encephalopathy 12/19/2020   Acute hyponatremia 12/19/2020   Thrombocytopenia (HCC) 12/19/2020   Chronic anemia 12/19/2020   Chronic diastolic CHF (congestive heart failure) (HCC) 12/19/2020   E coli bacteremia 12/19/2020   Prolonged QT interval 12/04/2020  Chronic pain 12/04/2020   Fall    Compression of lumbar vertebra ([place])    Type 2 diabetes mellitus with hypoglycemia without coma (HCC)    Hypothyroidism    Syncope 11/23/2020   AKI (acute kidney injury) ([place]) 11/22/2020   Multiple falls 09/14/2020   Pressure injury of skin 09/14/2020   Generalized weakness 09/14/2020   Hyponatremia 07/26/2020   Hypokalemia 07/26/2020    Closed displaced fracture of coracoid process of right shoulder 07/26/2020   Diabetes mellitus type 2 in obese ([place]) 07/26/2020   Elevated troponin 07/26/2020   Recurrent falls 08/16/2019   Sinus arrest 08/15/2019   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

## 2021-01-16 NOTE — Progress Notes (Signed)
PROGRESS NOTE    Gordon Kathol  [id number] DOB: [birth year] DOA: 01/12/2021 PCP: Guadlupe Spanish, MD   Brief Narrative:  75 year old with history of DM2, PAF , not on anticoagulation due to anemia per last discharge summary, sinus pause status post pacemaker, CHF with preserved EF, hypothyroidism admitted for altered mental status.  Patient was discharged 5 days ago after being treated for E. coli bacteremia, lumbar osteomyelitis with epidural abscess underwent laminectomy with evacuation of abscess.  Discharged on IV Ancef until 03/01/2021 by ID.  Also had urinary retention therefore was discharged on Foley catheter.  At SNF patient became confused having auditory and visual hallucination therefore brought to the hospital and was found to have sepsis secondary to UTI/CAUTI and multiple electrolyte imbalance.   Assessment & Plan:   Principal Problem:   Sepsis ([place]) Active Problems:   Hyponatremia   Hypokalemia   Elevated troponin   Hypothyroidism   Acute metabolic encephalopathy   Chronic diastolic CHF (congestive heart failure) (HCC)   Hypocalcemia   Hypomagnesemia   Sepsis secondary to UTI ([place])   Acute urinary retention   Urosepsis secondary to indwelling catheter; CAUTI -Blood Cultures- No growth. Urine Cx positive for Pseudomonas, empiric antibiotics- Cefepime, plan for total 7 days treatment -Foley exchanged on 12/21 in ED    Acute metabolic encephalopathy secondary to UTI w/indwelling catheter  -Mentation improved. Aaox3 today, continue to have poor memory, could not tell me why she is in the hospital   Elevated troponin  -Demand ischemia, remained flat.   Hyponatremia -seen by nephrology during recent hospitalization, appear responded to iv lasix/salt tabs and iv albumin , fluids restriction -urine sodium was 13 , resume salt tabs, fluid restriction, improving  -Close monitor volume status, Monitor oral intake, may need IV Lasix/LV albumin    Hypokalemia/hypomagnesemia/hypocalcemia -Aggressive repletion.  Repeat in the morning,  replace as needed   Left UE edema  -Venous Dopplers- SVT of the left arm, but no DVT.    History of recent E.coli bacteremia epidural abscess and laminectomy -Status post L4-L5 laminectomy with evacuation of epidural abscess on 12/12 with neurosurgery  - No active infection to healing surgical wound - Patient was placed on IV Ancef until 03/01/2021 by ID.Currently on IV cefepime for UTI, plan transition back to Ancef after treating UTI Picc line placed on 12/15    History of urinary retention -Foley catheter was placed prior to her discharge on 12/15.   -foley exchanged on 12/21  -Need outpatient urology follow-up   chronic diastolic heart failure Dehydrated, hold lasix. Continue gentle hydration for another 24 hrs. now off hydration Likely will have intravascular depletion due to low albumin Close monitor volume status  Recently diagnosed PAF, was seen by cardiology last admission, anticoagulation issue to be addressed upon outpatient follow-up with cardiology  Anemia of chronic disease -Baseline hemoglobin 8.0.  No obvious signs of bleeding. -check FOBT   Type 2 diabetes Last hemoglobin A1c of 8.4 in October -Sliding scale Accu-Cheks   Hypothyroidism Continue levothyroxine      DVT prophylaxis: Lovenox  Code Status: Full code Family Communication: I updated Daughter  over the phone on 12/24  Status is: Inpatient  Remains inpatient appropriate because: Maintain hosp stay for further IV electrolyte repetion. Continue IV Cefepime while awaiting futher culture data. PT/OT therefore after.   Return to snf once mental status and sodium improves        Body mass index is 36.29 kg/m.  Pressure Injury 12/19/20 Sacrum Medial Stage 2 -  Partial thickness loss of dermis presenting as a shallow open injury with a red, pink wound bed without slough. stage 2  sacrum  4cm x .5cm (Active)   12/19/20 1640  Location: Sacrum  Location Orientation: Medial  Staging: Stage 2 -  Partial thickness loss of dermis presenting as a shallow open injury with a red, pink wound bed without slough.  Wound Description (Comments): stage 2  sacrum  4cm x .5cm  Present on Admission: Yes      Subjective:  Feeling better. No acute events overnight.  Denies pain, today she is aaox3, not able to tell me why she is getting antibiotics.  She wants to go home, she does not want to go back to snf   Examination:  Constitutional: Not in acute distress, aaox3, +indwelling Foley Respiratory: Clear to auscultation bilaterally Cardiovascular: Normal sinus rhythm, no rubs Abdomen: Nontender nondistended good bowel sounds Musculoskeletal: No edema noted Skin: No rashes seen Neurologic: CN 2-12 grossly intact.  And nonfocal Psychiatric: Normal judgment and insight. Alert and oriented x 3. Normal mood.  Foley in place.   Objective: Vitals:   01/15/21 1659 01/15/21 2159 01/16/21 0707 01/16/21 1024  BP: (!) 147/88 (!) 134/59 (!) 166/79 129/89  Pulse: 85 80 80 90  Resp: 18 18 17 18   Temp: 98.2 F (36.8 C) 98 F (36.7 C) 97.9 F (36.6 C) 98.2 F (36.8 C)  TempSrc: Oral Oral Oral   SpO2: 93% 100% 100% 100%  Weight:      Height:        Intake/Output Summary (Last 24 hours) at 01/16/2021 1328 Last data filed at 01/16/2021 0800 Gross per 24 hour  Intake 3530 ml  Output 1550 ml  Net 1980 ml   Filed Weights   01/12/21 1838  Weight: 84.3 kg     Data Reviewed:   CBC: Recent Labs  Lab 01/12/21 1907 01/14/21 0256 01/15/21 0510 01/16/21 0302  WBC 7.6 7.1 6.6 4.6  NEUTROABS 5.0  --   --   --   HGB 8.7* 7.5* 7.4* 7.3*  HCT 26.9* 22.9* 22.7* 23.2*  MCV 88.8 89.5 91.9 92.4  PLT 208 258 287 [id number]   Basic Metabolic Panel: Recent Labs  Lab 01/12/21 2107 01/13/21 0036 01/13/21 0459 01/13/21 0808 01/14/21 0256 01/15/21 0510 01/16/21 0302  NA  --  128*  --  128* 129* 128* 131*  K   --  2.5*  --  3.4* 4.1 3.5 3.3*  CL  --  91*  --  92* 97* 98 101  CO2  --  28  --  25 24 25 24   GLUCOSE  --  132*  --  129* 131* 114* 127*  BUN  --  11  --  11 10 8 8   CREATININE  --  0.73  --  0.73 0.68 0.72 0.65  CALCIUM  --  6.3*  --  6.5* 6.4* 6.5* 6.7*  MG 1.1*  --  1.8  --  1.5* 1.9 1.7  PHOS  --   --   --  3.2 5.0*  --   --    GFR: Estimated Creatinine Clearance: 60.3 mL/min (by C-G formula based on SCr of 0.65 mg/dL). Liver Function Tests: Recent Labs  Lab 01/12/21 1907 01/16/21 0302  AST 23 19  ALT 6 5  ALKPHOS 189* 142*  BILITOT 1.1 0.9  PROT 6.1* 5.4*  ALBUMIN 2.1* 1.7*   No results for input(s): LIPASE, AMYLASE in the last 168 hours. No results for  input(s): AMMONIA in the last 168 hours.  Coagulation Profile: Recent Labs  Lab 01/12/21 1907  INR 1.3*   Cardiac Enzymes: No results for input(s): CKTOTAL, CKMB, CKMBINDEX, TROPONINI in the last 168 hours. BNP (last 3 results) No results for input(s): PROBNP in the last 8760 hours. HbA1C: No results for input(s): HGBA1C in the last 72 hours. CBG: Recent Labs  Lab 01/15/21 1121 01/15/21 1658 01/15/21 2202 01/16/21 0710 01/16/21 1146  GLUCAP 102* 87 146* 110* 116*   Lipid Profile: No results for input(s): CHOL, HDL, LDLCALC, TRIG, CHOLHDL, LDLDIRECT in the last 72 hours. Thyroid Function Tests: No results for input(s): TSH, T4TOTAL, FREET4, T3FREE, THYROIDAB in the last 72 hours. Anemia Panel: Recent Labs    01/16/21 0302  VITAMINB12 237  TIBC 123*  IRON 23*   Sepsis Labs: Recent Labs  Lab 01/12/21 1902 01/12/21 2107  LATICACIDVEN 1.8 1.2    Recent Results (from the past 240 hour(s))  Urine Culture     Status: Abnormal   Collection Time: 01/06/21  2:24 PM   Specimen: Urine, Catheterized  Result Value Ref Range Status   Specimen Description URINE, CATHETERIZED  Final   Special Requests   Final    NONE Performed at [facility], [address] [place], [place] [address]     Culture >=100,000 COLONIES/mL PSEUDOMONAS AERUGINOSA (A)  Final   Report Status 01/08/2021 FINAL  Final   Organism ID, Bacteria PSEUDOMONAS AERUGINOSA (A)  Final      Susceptibility   Pseudomonas aeruginosa - MIC*    CEFTAZIDIME 4 SENSITIVE Sensitive     CIPROFLOXACIN <=0.25 SENSITIVE Sensitive     GENTAMICIN <=1 SENSITIVE Sensitive     IMIPENEM 2 SENSITIVE Sensitive     PIP/TAZO 8 SENSITIVE Sensitive     CEFEPIME 2 SENSITIVE Sensitive     * >=100,000 COLONIES/mL PSEUDOMONAS AERUGINOSA  Resp Panel by RT-PCR (Flu A&B, Covid) Nasopharyngeal Swab     Status: None   Collection Time: 01/07/21  1:48 PM   Specimen: Nasopharyngeal Swab; Nasopharyngeal(NP) swabs in vial transport medium  Result Value Ref Range Status   SARS Coronavirus 2 by RT PCR NEGATIVE NEGATIVE Final    Comment: (NOTE) SARS-CoV-2 target nucleic acids are NOT DETECTED.  The SARS-CoV-2 RNA is generally detectable in upper respiratory specimens during the acute phase of infection. The lowest concentration of SARS-CoV-2 viral copies this assay can detect is 138 copies/mL. A negative result does not preclude SARS-Cov-2 infection and should not be used as the sole basis for treatment or other patient management decisions. A negative result may occur with  improper specimen collection/handling, submission of specimen other than nasopharyngeal swab, presence of viral mutation(s) within the areas targeted by this assay, and inadequate number of viral copies(<138 copies/mL). A negative result must be combined with clinical observations, patient history, and epidemiological information. The expected result is Negative.  Fact Sheet for Patients:  EntrepreneurPulse.com.au  Fact Sheet for Healthcare Providers:  IncredibleEmployment.be  This test is no t yet approved or cleared by the [place] FDA and  has been authorized for detection and/or diagnosis of SARS-CoV-2 by FDA under an  Emergency Use Authorization (EUA). This EUA will remain  in effect (meaning this test can be used) for the duration of the COVID-19 declaration under Section 564(b)(1) of the Act, 21 U.S.C.section 360bbb-3(b)(1), unless the authorization is terminated  or revoked sooner.       Influenza A by PCR NEGATIVE NEGATIVE Final   Influenza B by  PCR NEGATIVE NEGATIVE Final    Comment: (NOTE) The Xpert Xpress SARS-CoV-2/FLU/RSV plus assay is intended as an aid in the diagnosis of influenza from Nasopharyngeal swab specimens and should not be used as a sole basis for treatment. Nasal washings and aspirates are unacceptable for Xpert Xpress SARS-CoV-2/FLU/RSV testing.  Fact Sheet for Patients: EntrepreneurPulse.com.au  Fact Sheet for Healthcare Providers: IncredibleEmployment.be  This test is not yet approved or cleared by the [place] FDA and has been authorized for detection and/or diagnosis of SARS-CoV-2 by FDA under an Emergency Use Authorization (EUA). This EUA will remain in effect (meaning this test can be used) for the duration of the COVID-19 declaration under Section 564(b)(1) of the Act, 21 U.S.C. section 360bbb-3(b)(1), unless the authorization is terminated or revoked.  Performed at [facility], [place] [address], [place], [place] [address]   Urine Culture     Status: Abnormal   Collection Time: 01/12/21  1:00 PM   Specimen: In/Out Cath Urine  Result Value Ref Range Status   Specimen Description IN/OUT CATH URINE  Final   Special Requests   Final    NONE Performed at [facility], [place] [address] [place], [place] [address]    Culture >=100,000 COLONIES/mL PSEUDOMONAS AERUGINOSA (A)  Final   Report Status 01/15/2021 FINAL  Final   Organism ID, Bacteria PSEUDOMONAS AERUGINOSA (A)  Final      Susceptibility   Pseudomonas aeruginosa - MIC*    CEFTAZIDIME 2 SENSITIVE Sensitive     CIPROFLOXACIN <=0.25 SENSITIVE Sensitive      GENTAMICIN <=1 SENSITIVE Sensitive     IMIPENEM 2 SENSITIVE Sensitive     PIP/TAZO <=4 SENSITIVE Sensitive     CEFEPIME 2 SENSITIVE Sensitive     * >=100,000 COLONIES/mL PSEUDOMONAS AERUGINOSA  Resp Panel by RT-PCR (Flu A&B, Covid)     Status: None   Collection Time: 01/12/21  7:07 PM   Specimen: Nasopharyngeal(NP) swabs in vial transport medium  Result Value Ref Range Status   SARS Coronavirus 2 by RT PCR NEGATIVE NEGATIVE Final    Comment: (NOTE) SARS-CoV-2 target nucleic acids are NOT DETECTED.  The SARS-CoV-2 RNA is generally detectable in upper respiratory specimens during the acute phase of infection. The lowest concentration of SARS-CoV-2 viral copies this assay can detect is 138 copies/mL. A negative result does not preclude SARS-Cov-2 infection and should not be used as the sole basis for treatment or other patient management decisions. A negative result may occur with  improper specimen collection/handling, submission of specimen other than nasopharyngeal swab, presence of viral mutation(s) within the areas targeted by this assay, and inadequate number of viral copies(<138 copies/mL). A negative result must be combined with clinical observations, patient history, and epidemiological information. The expected result is Negative.  Fact Sheet for Patients:  EntrepreneurPulse.com.au  Fact Sheet for Healthcare Providers:  IncredibleEmployment.be  This test is no t yet approved or cleared by the [place] FDA and  has been authorized for detection and/or diagnosis of SARS-CoV-2 by FDA under an Emergency Use Authorization (EUA). This EUA will remain  in effect (meaning this test can be used) for the duration of the COVID-19 declaration under Section 564(b)(1) of the Act, 21 U.S.C.section 360bbb-3(b)(1), unless the authorization is terminated  or revoked sooner.       Influenza A by PCR NEGATIVE NEGATIVE Final   Influenza B by  PCR NEGATIVE NEGATIVE Final    Comment: (NOTE) The Xpert Xpress SARS-CoV-2/FLU/RSV plus assay is intended as  an aid in the diagnosis of influenza from Nasopharyngeal swab specimens and should not be used as a sole basis for treatment. Nasal washings and aspirates are unacceptable for Xpert Xpress SARS-CoV-2/FLU/RSV testing.  Fact Sheet for Patients: BloggerCourse.com  Fact Sheet for Healthcare Providers: SeriousBroker.it  This test is not yet approved or cleared by the [place] FDA and has been authorized for detection and/or diagnosis of SARS-CoV-2 by FDA under an Emergency Use Authorization (EUA). This EUA will remain in effect (meaning this test can be used) for the duration of the COVID-19 declaration under Section 564(b)(1) of the Act, 21 U.S.C. section 360bbb-3(b)(1), unless the authorization is terminated or revoked.  Performed at [facility], [address] [place], [place] [address]   Blood Culture (routine x 2)     Status: None (Preliminary result)   Collection Time: 01/12/21  7:07 PM   Specimen: BLOOD  Result Value Ref Range Status   Specimen Description BLOOD LEFT ANTECUBITAL  Final   Special Requests   Final    BOTTLES DRAWN AEROBIC AND ANAEROBIC Blood Culture adequate volume   Culture   Final    NO GROWTH 4 DAYS Performed at [facility], [address], [place], [place] [address]    Report Status PENDING  Incomplete  Blood Culture (routine x 2)     Status: None (Preliminary result)   Collection Time: 01/13/21  8:08 AM   Specimen: BLOOD  Result Value Ref Range Status   Specimen Description BLOOD LEFT ANTECUBITAL  Final   Special Requests   Final    BOTTLES DRAWN AEROBIC AND ANAEROBIC Blood Culture adequate volume   Culture   Final    NO GROWTH 3 DAYS Performed at [facility], [address], [place], [place] [address]    Report Status PENDING  Incomplete         Radiology  Studies: No results found.      Scheduled Meds:  calcium-vitamin D  1 tablet Oral BID WC   Chlorhexidine Gluconate Cloth  6 each Topical Daily   enoxaparin (LOVENOX) injection  40 mg Subcutaneous Q24H   folic acid  1 mg Oral Daily   gabapentin  300 mg Oral BID   insulin aspart  0-9 Units Subcutaneous TID WC   levothyroxine  150 mcg Oral QAC breakfast   pantoprazole  40 mg Oral Daily   potassium chloride  40 mEq Oral Q4H   pramipexole  1.5 mg Oral TID   QUEtiapine  25 mg Oral QHS   sodium chloride flush  10-40 mL Intracatheter Q12H   sodium chloride  1 g Oral BID WC   vitamin B-12  1,000 mcg Oral Daily   Continuous Infusions:  ceFEPime (MAXIPIME) IV 2 g (01/16/21 1016)     LOS: 4 days     Albertine Grates, MD PhD FACP [facility]  If 7PM-7AM, please contact night-coverage  01/16/2021, 1:28 PM

## 2021-01-16 NOTE — TOC Progression Note (Signed)
Transition of Care [facility]) - Progression Note    Patient Details  Name: Jillian Leblanc MRN: [id number] Date of Birth: [birth year]  Transition of [facility]) CM/SW Contact  Bess Kinds, RN Phone Number: [phone] 01/16/2021, 11:28 AM  Clinical Narrative:     Spoke with patient's daughter, [name], on her mobile phone to discuss post acute transition. Family would like patient to return home with [facility] services - currently active with [facility] for PT 2x/week - Will need HH PT orders for resumption of services. [name] stated that recent stay at SNF did not go well.   [name] asked about patient getting a hospital bed d/t needing to change position frequently and needing HOB elevated. Referral to [employer] for delivery to the home. Anticipate home delivery on Monday. Patient can return home prior to DME delivery. Patient already has RW and WC at home.   Patient will need ambulance transport home at discharge. Demographics confirmed.   TOC following for transition needs.    Expected Discharge Plan: Home w Home Health Services Barriers to Discharge: Continued Medical Work up  Expected Discharge Plan and Services Expected Discharge Plan: Home w Home Health Services In-house Referral: Clinical Social Work Discharge Planning Services: CM Consult Post Acute Care Choice: Home Health Living arrangements for the past 2 months: Single Family Home                 DME Arranged: Hospital bed DME Agency: [employer] Date DME Agency Contacted: 01/16/21 Time DME Agency Contacted: 1123 Representative spoke with at DME Agency: Leavy Cella HH Arranged: PT, OT HH Agency: [facility] Date [facility] Agency Contacted: 01/16/21 Time HH Agency Contacted: 1124 Representative spoke with at [facility] Agency: Kandee Keen   Social Determinants of Health (SDOH) Interventions    Readmission Risk Interventions No flowsheet data found.

## 2021-01-16 NOTE — TOC Progression Note (Signed)
Transition of Care [facility]) - Progression Note    Patient Details  Name: Jillian Leblanc MRN: [id number] Date of Birth: 07-08-1945  Transition [facility]) CM/SW Contact  Bess Kinds, RN Phone Number: [phone] 01/16/2021, 1:41 PM  Clinical Narrative:     Patient requiring home infusion antibiotics - cefepime every eight hours. Frances Furbish able to add nursing for IV antibiotics with start of care Wednesday. Referral to [facility] for home antibiotics - teaching with family to be done on Monday with delivery of antibiotics to home on Tuesday. Confirmed with [employer] for delivery on hospital bed/hoyer lift on Monday. Spoke with daughter on the phone to discuss barriers to transition home this weekend - daughter disappointed but verbalized understanding.   Expected Discharge Plan: Home w Home Health Services Barriers to Discharge: Continued Medical Work up  Expected Discharge Plan and Services Expected Discharge Plan: Home w Home Health Services In-house Referral: Clinical Social Work Discharge Planning Services: CM Consult Post Acute Care Choice: Home Health Living arrangements for the past 2 months: Single Family Home                 DME Arranged: Hospital bed DME Agency: [facility] Date DME Agency Contacted: 01/16/21 Time DME Agency Contacted: 1123 Representative spoke with at DME Agency: Leavy Cella HH Arranged: PT, OT HH Agency: [facility] Date [facility] Agency Contacted: 01/16/21 Time HH Agency Contacted: 1124 Representative spoke with at [facility] Agency: Kandee Keen   Social Determinants of Health (SDOH) Interventions    Readmission Risk Interventions No flowsheet data found.

## 2021-01-16 NOTE — Plan of Care (Signed)
  Problem: Pain Managment: Goal: General experience of comfort will improve Outcome: Progressing   Problem: Safety: Goal: Ability to remain free from injury will improve Outcome: Progressing   

## 2021-01-17 ENCOUNTER — Inpatient Hospital Stay (HOSPITAL_COMMUNITY): Payer: Medicare Other

## 2021-01-17 LAB — CBC
HCT: 23.2 % — ABNORMAL LOW (ref 36.0–46.0)
Hemoglobin: 7.5 g/dL — ABNORMAL LOW (ref 12.0–15.0)
MCH: 29.8 pg (ref 26.0–34.0)
MCHC: 32.3 g/dL (ref 30.0–36.0)
MCV: 92.1 fL (ref 80.0–100.0)
Platelets: 326 10*3/uL (ref 150–400)
RBC: 2.52 MIL/uL — ABNORMAL LOW (ref 3.87–5.11)
RDW: 17.5 % — ABNORMAL HIGH (ref 11.5–15.5)
WBC: 5 10*3/uL (ref 4.0–10.5)
nRBC: 0 % (ref 0.0–0.2)

## 2021-01-17 LAB — BASIC METABOLIC PANEL
Anion gap: 5 (ref 5–15)
BUN: 7 mg/dL — ABNORMAL LOW (ref 8–23)
CO2: 23 mmol/L (ref 22–32)
Calcium: 6.7 mg/dL — ABNORMAL LOW (ref 8.9–10.3)
Chloride: 104 mmol/L (ref 98–111)
Creatinine, Ser: 0.59 mg/dL (ref 0.44–1.00)
GFR, Estimated: >60 mL/min (ref >60)
Glucose, Bld: 130 mg/dL — ABNORMAL HIGH (ref 70–99)
Potassium: 3.7 mmol/L (ref 3.5–5.1)
Sodium: 132 mmol/L — ABNORMAL LOW (ref 135–145)

## 2021-01-17 LAB — GLUCOSE, CAPILLARY
Glucose-Capillary: 110 mg/dL — ABNORMAL HIGH (ref 70–99)
Glucose-Capillary: 172 mg/dL — ABNORMAL HIGH (ref 70–99)
Glucose-Capillary: 117 mg/dL — ABNORMAL HIGH (ref 70–99)
Glucose-Capillary: 138 mg/dL — ABNORMAL HIGH (ref 70–99)

## 2021-01-17 LAB — CULTURE, BLOOD (ROUTINE X 2)
Culture: NO GROWTH
Special Requests: ADEQUATE

## 2021-01-17 LAB — MAGNESIUM: Magnesium: 1.8 mg/dL (ref 1.7–2.4)

## 2021-01-17 MED ORDER — ENOXAPARIN SODIUM 40 MG/0.4ML IJ SOSY
40.0000 mg | PREFILLED_SYRINGE | INTRAMUSCULAR | Status: DC
  Administered 2021-01-20 – 2021-01-22 (×3): 40 mg via SUBCUTANEOUS
  Filled 2021-01-17 (×4): qty 0.4

## 2021-01-17 MED ORDER — LIDOCAINE 5 % EX PTCH
1.0000 | MEDICATED_PATCH | CUTANEOUS | Status: DC
  Administered 2021-01-17 – 2021-01-22 (×6): 1 via TRANSDERMAL
  Filled 2021-01-17 (×6): qty 1

## 2021-01-17 MED ORDER — POTASSIUM CHLORIDE 20 MEQ PO PACK
40.0000 meq | PACK | Freq: Every day | ORAL | Status: DC
  Administered 2021-01-17 – 2021-01-22 (×6): 40 meq via ORAL
  Filled 2021-01-17 (×6): qty 2

## 2021-01-17 MED ORDER — MAGNESIUM SULFATE 2 GM/50ML IV SOLN
2.0000 g | Freq: Once | INTRAVENOUS | Status: AC
  Administered 2021-01-17: 2 g via INTRAVENOUS
  Filled 2021-01-17: qty 50

## 2021-01-17 MED ORDER — SENNOSIDES-DOCUSATE SODIUM 8.6-50 MG PO TABS
1.0000 | ORAL_TABLET | Freq: Two times a day (BID) | ORAL | Status: DC
  Administered 2021-01-17 – 2021-01-22 (×10): 1 via ORAL
  Filled 2021-01-17 (×10): qty 1

## 2021-01-17 NOTE — Progress Notes (Signed)
Pharmacy Antibiotic Note  Jillian Leblanc is a 75 y.o. female currently on day #5 Cefepime for Pseudomonas UTI.  Was on Cefazolin  for 8 weeks course prior to admit and from previous admission for E coli bacteremia/epidural absess/lumbar osteomyelitis. Cefazolin 2 gm IV q8h thru 03/01/21 per prior plan. (OPAT note 01/06/21)  Plan: Cefepime begun 12/20 pm > 7 days will be 12/27 pm. Complete Cefepime 2gm IV q8h course then resume Cefazolin 2gm IV q8h.  Cefepime stop time is NOT currently in place.  Will follow up for transition back to Cefazolin.  Height: 5' (152.4 cm) Weight: 84.3 kg (185 lb 12.8 oz) IBW/kg (Calculated) : 45.5  Temp (24hrs), Avg:98.6 F (37 C), Min:98.2 F (36.8 C), Max:99 F (37.2 C)  Recent Labs  Lab 01/12/21 1902 01/12/21 1907 01/12/21 2107 01/13/21 0036 01/13/21 0808 01/14/21 0256 01/15/21 0510 01/16/21 0302 01/17/21 0326  WBC  --  7.6  --   --   --  7.1 6.6 4.6 5.0  CREATININE  --  0.89  --    < > 0.73 0.68 0.72 0.65 0.59  LATICACIDVEN 1.8  --  1.2  --   --   --   --   --   --    < > = values in this interval not displayed.    Estimated Creatinine Clearance: 60.3 mL/min (by C-G formula based on SCr of 0.59 mg/dL).    Allergies  Allergen Reactions   Pentosan Polysulfate Sodium Other (See Comments)    "ELMIRON Y Drug hair fell out 10/21/2009 12:00:00 AM by Johny Blamer CNA 1"   Elmiron [Pentosan Polysulfate] Other (See Comments)    "Hair fell out"   Naloxone Other (See Comments)    Hallucinations/Confusion/Nightmares    Antimicrobials this admission: Cefepime 12/20 pm >> (12/27) Vancomycin 12/20 >> 12/21 (got x 1 on 12/20) * off Cefazolin from 11/27 and PTA >> (03/01/21)   Dose adjustments this admission: 12/22 Cefepime 2gm IV q12h > q8hr for improved renal function  Microbiology results: 12/20 blood: no growth x 4 days to date 12/21 blood: no growth x 3 days to date 12/21 urine: >100K/ml Pseudomonas, sens to Cefepime, fortaz, cipro, imi, zosyn,  gent (MIC < 1) 12/20 COVID and flu: negative  Thank you for allowing pharmacy to be a part of this patients care.  Dennie Fetters, [place] 01/17/2021 12:26 PM

## 2021-01-17 NOTE — Consult Note (Addendum)
Patient ID: Jillian Leblanc MRN: [id number] DOB/AGE: 1945-09-04 75 y.o.  Admit date: 01/12/2021  Admission Diagnoses:  Principal Problem:   Sepsis ([place]) Active Problems:   Hyponatremia   Hypokalemia   Elevated troponin   Hypothyroidism   Acute metabolic encephalopathy   Chronic diastolic CHF (congestive heart failure) ([place])   Hypocalcemia   Hypomagnesemia   Sepsis secondary to UTI [facility])   Acute urinary retention   HPI: The patient is currently admitted to [facility] hospitalist service for altered mental status and sepsis secondary to UTI/CAUTI. Of note, she was most recently admitted for E. Coli bacteremia and lumbar epidural abscess requiring decompression. Orthopaedics is consulted for left shoulder dislocation. The patient was otherwise cleared for discharge to SNF vs home.   She is a patient of Dr. Justice Britain in [place]. 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 She is doing excellent from this right shoulder. However, she now has pain and limited ROM in the left shoulder. She is unable to recall the exact duration or onset of this left shoulder pain. She cannot recall a specific injury.   During our interview today, she is quite pleasant and maintains coherent conversation but occasionally forgets where she is or who she is talking to. She is easily reoriented. She reports pain in the left shoulder for a minimum of 1 week or potentially 3 months duration.  Her PMH is extensive and notable for DM2, PA, not on anticoagulation due to anemia per last discharge summary, sinus pause s/p pacemaker, CHF with preserved EF, hypothyroidism.   Past Medical History: Past Medical History:  Diagnosis Date   CHF (congestive heart failure) (HCC)    Chronic back pain    Diabetes mellitus without complication ([place])    Dysrhythmia    Hypothyroidism    Interstitial cystitis    Presence  of permanent cardiac pacemaker    Thyroid disease     Surgical History: Past Surgical History:  Procedure Laterality Date   ABDOMINAL HYSTERECTOMY     CHOLECYSTECTOMY     NECK SURGERY     PACEMAKER IMPLANT N/A 08/16/2019   Procedure: PACEMAKER IMPLANT;  Surgeon: Constance Haw, MD;  Location: [facility];  Service: Cardiovascular;  Laterality: N/A;   REVERSE SHOULDER ARTHROPLASTY Right 07/28/2020   Procedure: REVERSE SHOULDER ARTHROPLASTY;  Surgeon: Justice Britain, MD;  Location: [facility];  Service: Orthopedics;  Laterality: Right;   SHOULDER SURGERY     THYROIDECTOMY      Family History: No family history on file.  Social History: Social History   Socioeconomic History   Marital status: Married    Spouse name: Not on file   Number of children: Not on file   Years of education: Not on file   Highest education level: Not on file  Occupational History   Not on file  Tobacco Use   Smoking status: Never   Smokeless tobacco: Never  Vaping Use   Vaping Use: Never used  Substance and Sexual Activity   Alcohol use: No   Drug use: Never   Sexual activity: Not on file  Other Topics Concern   Not on file  Social History Narrative   Not on file   Social Determinants of Health   Financial Resource Strain: Not on file  Food Insecurity: Not on file  Transportation Needs: Not on file  Physical Activity: Not on file  Stress: Not on file  Social Connections: Not on file  Intimate Partner Violence: Not on file    Allergies: Pentosan polysulfate sodium, Elmiron [pentosan polysulfate], and Naloxone  Medications: I have reviewed the patient's current medications.  Vital Signs: Patient Vitals for the past 24 hrs:  BP Temp Temp src Pulse Resp SpO2  01/17/21 2045 (!) 136/96 99.6 F (37.6 C) Oral 82 18 100 %  01/17/21 1852 (!) 140/59 99.3 F (37.4 C) -- 88 16 98 %  01/17/21 1036 111/78 98.4 F (36.9 C) Oral 85 18 92 %  01/17/21 0400 110/83 98.6 F (37 C) -- 75 18  100 %    Radiology: CT ABDOMEN PELVIS WO CONTRAST  Result Date: 12/19/2020 CLINICAL DATA:  75 year old female abdominal and pelvic pain. Sepsis. EXAM: CT ABDOMEN AND PELVIS WITHOUT CONTRAST TECHNIQUE: Multidetector CT imaging of the abdomen and pelvis was performed following the standard protocol without IV contrast. COMPARISON:  11/22/2020 lumbar spine CT 06/08/2018 CT FINDINGS: Please note that parenchymal and vascular abnormalities may be missed as intravenous contrast was not administered. Lower chest: No acute abnormality. Pacemaker/AICD leads are present. Hepatobiliary: No hepatic abnormalities are identified. The patient is status post cholecystectomy. There is no evidence of intrahepatic or extrahepatic biliary dilatation. 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 There is no evidence gas within the renal collecting system or renal parenchyma. No hydronephrosis is identified. Scattered areas of RIGHT renal scarring are present. The adrenal glands are unremarkable. Stomach/Bowel: There is no evidence of bowel obstruction, definite bowel wall thickening or bowel inflammatory changes. Vascular/Lymphatic: Aortic atherosclerosis. No enlarged abdominal or pelvic lymph nodes. Reproductive: Status post hysterectomy. No adnexal masses. Other: Subcutaneous edema is noted. There is no evidence of ascites, focal collection or pneumoperitoneum. Musculoskeletal: A compression fracture of L5 is again identified, increased from 35% on 11/22/2020 to 50% today, now with irregularity of the L5 vertebral body. There are foci of gas in the L4-S1 paravertebral regions with small amount of gas posterior along the spinal canal at L5-S1. No discrete focal collection is identified on this noncontrast study. Disc protrusion at L5-S1 is again noted. A healing RIGHT L1 transverse process fracture remote T12 compression fracture  is present. Sacral stimulator again noted IMPRESSION: 1. Increasing compression fracture and heterogeneity of L5 with new paravertebral gas from L4-S1 since 11/22/2020. Although L5 bony changes and paravertebral gas could be related to healing fracture and gas extension from vacuum disc, this is suspicious for infection/osteomyelitis given patient's sepsis. 2. 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 No evidence of gas within the renal collecting system or renal parenchyma. 3. Aortic Atherosclerosis (ICD10-I70.0). Critical Value/emergent results were called by telephone at the time of interpretation on 12/19/2020 at 10:58 am to provider [facility] , who verbally acknowledged these results. Electronically Signed   By: Margarette Canada M.D.   On: 12/19/2020 11:00   DG Chest 1 View  Result Date: 01/12/2021 CLINICAL DATA:  Altered mental status EXAM: CHEST  1 VIEW COMPARISON:  01/06/2021 FINDINGS: Lungs are clear. No pneumothorax or pleural effusion. Right upper extremity PICC line tip is seen within the superior cavoatrial junction. Cardiac size within normal limits. Left subclavian dual lead pacemaker is unchanged with leads overlying the right atrium and right ventricle. Pulmonary vascularity is normal. No acute bone abnormality. Right total shoulder arthroplasty has been performed. IMPRESSION: No active disease. Electronically Signed   By: Fidela Salisbury M.D.   On: 01/12/2021 20:36  DG Lumbar Spine 2-3 Views  Result Date: 01/04/2021 CLINICAL DATA:  Intraoperative evaluation, laminectomy infusion EXAM: LUMBAR SPINE - 2-3 VIEW COMPARISON:  01/04/2021 FINDINGS: Four fluoroscopic images are obtained during the performance of the procedure and are provided for interpretation only. Images demonstrate intrapedicular screws within for contiguous levels of the lumbar spine. Evaluation is limited due to technique and body habitus. Please refer to operative report. FLUOROSCOPY TIME:  2 minutes  and 7 seconds IMPRESSION: 1. Limited intraoperative evaluation during multilevel lumbar levin ectomy infusion. Electronically Signed   By: Randa Ngo M.D.   On: 01/04/2021 23:30   DG Elbow 2 Views Left  Result Date: 12/30/2020 CLINICAL DATA:  Left elbow pain and swelling. EXAM: LEFT ELBOW - 2 VIEW COMPARISON:  None. FINDINGS: There is no evidence of fracture, dislocation, or joint effusion. There is no evidence of arthropathy or other focal bone abnormality. Soft tissues are unremarkable. IMPRESSION: Negative. Electronically Signed   By: Marijo Conception M.D.   On: 12/30/2020 11:01   DG Elbow Complete Left  Result Date: 01/12/2021 CLINICAL DATA:  Left elbow pain EXAM: LEFT ELBOW - COMPLETE 3+ VIEW COMPARISON:  None. FINDINGS: Normal alignment. No acute fracture or dislocation. Joint spaces appear preserved. No definite effusion though lateral positioning is suboptimal. Extensive diffuse soft tissue swelling is seen of the visualized left upper extremity. IMPRESSION: Diffuse soft tissue swelling.  No acute fracture or dislocation. Electronically Signed   By: Fidela Salisbury M.D.   On: 01/12/2021 20:37   DG Forearm Left  Result Date: 01/12/2021 CLINICAL DATA:  Left arm pain EXAM: LEFT FOREARM - 2 VIEW COMPARISON:  None. FINDINGS: No acute fracture or dislocation. Normal alignment. No osseous erosions or abnormal periosteal reaction. Diffuse subcutaneous edema is seen of the left forearm more severe along the dorsal aspect. IMPRESSION: Extensive soft tissue swelling.  No acute fracture or dislocation. Electronically Signed   By: Fidela Salisbury M.D.   On: 01/12/2021 20:40   DG Wrist Complete Left  Result Date: 01/12/2021 CLINICAL DATA:  Left wrist pain EXAM: LEFT WRIST - COMPLETE 3+ VIEW COMPARISON:  None. FINDINGS: Normal alignment. No acute fracture or dislocation. Joint spaces are preserved. Extensive surrounding soft tissue swelling is noted. Mild dystrophic calcification involving the TFCC  versus remote nonunited ulnar styloid fracture. IMPRESSION: No acute fracture or dislocation.  Extensive soft tissue swelling. Electronically Signed   By: Fidela Salisbury M.D.   On: 01/12/2021 20:41   CT LUMBAR SPINE WO CONTRAST  Result Date: 01/04/2021 CLINICAL DATA:  Low back pain, cauda equina syndrome suspected EXAM: CT LUMBAR SPINE WITHOUT CONTRAST TECHNIQUE: Multidetector CT imaging of the lumbar spine was performed without intravenous contrast administration. Multiplanar CT image reconstructions were also generated. COMPARISON:  CT 12/19/2020 FINDINGS: Segmentation: 5 lumbar type vertebrae. Alignment: Focal kyphosis at T12 related to a T12 anterior compression deformity. No significant listhesis. Vertebrae: Progressive sclerosis and bony destruction of the L5 vertebral body. Height loss remains at about 40%. There is approximately 4 mm of bony retropulsion, similar to prior exam. Paraspinal and other soft tissues: There is bilateral hydronephrosis likely related to a markedly distended urinary bladder which is partially visualized. Aortoiliac atherosclerotic calcifications. No lymphadenopathy. There is a right-sided sacral nerve stimulator in place. Disc levels: Unchanged chronic disc and degenerative endplate changes at [id number], T12-L1, and L1-L2. L2-L3: No significant spinal canal or neural foraminal narrowing. L3-L4: Nonspecific mineralization along the anterior spinal canal in a linear and longitudinal orientation. This is new since recent CT  on November 26th. L4-L5: Unchanged broad-based disc bulging. Decreased amount of paraspinal gas but increased soft tissue density along the anterior and lateral paraspinal soft tissues (axial series 3, image 96). Moderate to severe right and mild-to-moderate left-sided neural foraminal narrowing. There is mild to moderate spinal canal narrowing. L5-S1: Decreased amount of intradiscal and paraspinal gas. Unchanged degree of moderate spinal canal stenosis and  bilateral neural foraminal narrowing. IMPRESSION: Progressive sclerosis and bony destruction of the L5 vertebral body, though with unchanged overall 40% height loss. Decreased paraspinal and intradiscal gas. Increased anterior and lateral paraspinal soft tissue density at L4-L5. Findings are concerning for discitis osteomyelitis with paraspinal phlegmon. Unchanged degree of spinal canal stenosis and bilateral neural foraminal narrowing at L4-L5 and L5-S1. Assessment for epidural collections is limited by noncontrast CT. If able, MRI would be ideal for further evaluation. Otherwise CT with contrast may be useful. Mineralization along the anterior spinal canal at L3-L4, in a linear and longitudinal orientation, new since recent CT on 11/26. 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 Electronically Signed   By: Caprice RenshawJacob  Kahn M.D.   On: 01/04/2021 11:11   NM Pulmonary Perfusion  Result Date: 12/21/2020 CLINICAL DATA:  Hypotension, tachypnea, atrial fibrillation with rapid ventricular response in setting of sepsis, question pulmonary embolism EXAM: NUCLEAR MEDICINE PERFUSION LUNG SCAN TECHNIQUE: Perfusion images were obtained in multiple projections after intravenous injection of radiopharmaceutical. Ventilation scans intentionally deferred if perfusion scan and chest x-ray adequate for interpretation during COVID 19 epidemic. RADIOPHARMACEUTICALS:  4.4 mCi Tc-1974m MAA IV COMPARISON:  None Correlation: Chest radiograph 12/21/2020 FINDINGS: Normal perfusion lung scan. No perfusion defects. IMPRESSION: Normal perfusion lung scan. Electronically Signed   By: Ulyses SouthwardMark  Boles M.D.   On: 12/21/2020 11:43   CT ELBOW LEFT WO CONTRAST  Result Date: 01/02/2021 CLINICAL DATA:  Soft tissue infection suspected, elbow, xray done E. coli bacteremia with lumbar spine osteo-/epidural abscess-with persistent left  elbow pain. EXAM: CT OF THE UPPER LEFT EXTREMITY WITHOUT CONTRAST TECHNIQUE: Multidetector CT imaging of the upper left extremity was performed according to the standard protocol. COMPARISON:  X-ray 12/30/2020 FINDINGS: Technical note: Examination is limited secondary to beam hardening artifact related to patient positioning of the elbow adjacent to the abdominal wall. Elbow is flexed resulting in nonstandard imaging planes. Bones/Joint/Cartilage No acute fracture. No dislocation. Elbow joint spaces are preserved. No erosion or periosteal elevation. No appreciable elbow joint effusion. Ligaments Suboptimally assessed by CT. Muscles and Tendons Evaluation of the musculotendinous structures is significantly limited. No obvious intramuscular fluid collection. Soft tissues Soft tissue edema with ill-defined fluid most pronounced along the ulnar aspect of the proximal forearm. No organized fluid collection is evident by noncontrast CT. No soft tissue gas. Catheter tubing is visualized within the upper arm. IMPRESSION: 1. Limited exam. 2. Soft tissue edema with ill-defined fluid most pronounced along the ulnar aspect of the proximal forearm, suggesting cellulitis. No organized fluid collection is evident. 3. No appreciable elbow joint effusion to suggest septic arthritis. 4. No acute osseous abnormality. Electronically Signed   By: Duanne GuessNicholas  Plundo D.O.   On: 01/02/2021 17:17   CT L-SPINE NO CHARGE  Result Date: 12/19/2020 CLINICAL DATA:  Epidural abscess.  Chronic back pain. EXAM: CT LUMBAR SPINE WITHOUT CONTRAST TECHNIQUE: Multiplanar CT image reconstructions of the lumbar spine were generated from data acquired during abdominopelvic CT of the same date. Abdominopelvic CT findings dictated separately. COMPARISON:  Abdominopelvic CT same date. Lumbar spine CT 11/22/2020. FINDINGS: Segmentation: There are 5 lumbar type vertebral bodies. Alignment: Normal. Vertebrae: Mildly progressive loss of vertebral body height at  the L5 burst fracture compared with the CT from last month. There is approximately 40% loss of vertebral body height with progressive irregular lucency and sclerosis in the L5 vertebral body and 1st sacral segment, suspicious for osteomyelitis. A chronic superior endplate compression deformity at T12 is unchanged. The sacroiliac joints appear unremarkable. There is a right sacral spinal stimulator. Paraspinal and other soft tissues: There is gas and soft tissue stranding within the paraspinal soft tissues at L4 and L5. No focal fluid collections are identified. There is gas within the urinary bladder wall. Additional abdominopelvic findings deferred to separate abdominopelvic CT report. Disc levels: Stable chronic disc and endplate degeneration at T11-12, T12-L1 and L1-2. No significant disc space findings at L2-3 or L3-4. L4-5: Disc height is preserved. Annular disc bulging appears mildly progressive, and there is new gas within the left lateral recess. There is mild facet and ligamentous hypertrophy. Lateral recess and foraminal narrowing appears progressive. No drainable fluid collection identified. L5-S1: Interval decreased gas within the disc with progressive annular disc bulging and a right paracentral disc protrusion. As above, there are small collections of gas within the paraspinal soft tissues as well as the ventral epidural space. Lateral recess and foraminal narrowing appears progressive. No drainable fluid collection identified. IMPRESSION: 1. Progressive loss of vertebral body height at the L5 fracture compared with prior CT of 1 month ago. Osseous changes within the L5 vertebral body and upper sacrum are suspicious for developing osteomyelitis and discitis, especially given the paraspinal inflammatory changes and small gas collections. 2. Increased lateral recess and foraminal narrowing bilaterally at L4-5 and L5-S1, in part due to epidural gas. Assessment for epidural fluid collections is limited by  noncontrast CT. Consider MRI for further evaluation if possible (patient has a sacral spinal stimulator). If unable to undergo MRI, dedicated postcontrast imaging of the lumbar spine could be performed. 3. Abdominopelvic findings dictated separately. Electronically Signed   By: Richardean Sale M.D.   On: 12/19/2020 12:46   DG CHEST PORT 1 VIEW  Result Date: 12/21/2020 CLINICAL DATA:  Sepsis, wheezing, shortness of breath. EXAM: PORTABLE CHEST 1 VIEW COMPARISON:  12/19/2020 and CT chest 09/29/2020. FINDINGS: [name] slightly rotated. Trachea is midline. Heart is enlarged, stable. Thoracic aorta is calcified. Pacemaker lead tips are in the right atrium and right ventricle. Lungs are somewhat low in volume with mild pulmonary vascular prominence. No pleural fluid. Right shoulder arthroplasty. Gaseous distension of the stomach, incompletely imaged. IMPRESSION: 1. Mild pulmonary vascular prominence may be due to low lung volumes. Difficult to exclude mild edema. 2.  Aortic atherosclerosis (ICD10-I70.0). Electronically Signed   By: Lorin Picket M.D.   On: 12/21/2020 08:06   DG CHEST PORT 1 VIEW  Result Date: 12/19/2020 CLINICAL DATA:  Severe sepsis. EXAM: PORTABLE CHEST 1 VIEW COMPARISON:  12/18/2020. FINDINGS: The heart is enlarged and the pulmonary vasculature is mildly distended. Atherosclerotic calcification of the aorta is noted. Lung volumes are low. No consolidation, effusion, or pneumothorax. A dual lead pacemaker is present over the left chest. The bony structures are stable. IMPRESSION: Cardiomegaly with pulmonary vascular congestion. Electronically Signed   By: Brett Fairy M.D.   On: 12/19/2020 03:09   DG Chest Port 1V same Day  Result Date: 01/06/2021 CLINICAL DATA:  Shortness of breath EXAM: PORTABLE CHEST 1 VIEW COMPARISON:  12/21/2020  FINDINGS: Transverse diameter of heart is increased. There are no signs of alveolar pulmonary edema. Increased interstitial markings are seen in the  left lower lung fields. There is no focal consolidation. There is no pleural effusion or pneumothorax. Pacemaker battery is seen in the left infraclavicular region. There is previous right shoulder arthroplasty. IMPRESSION: Increased interstitial markings are seen in the left lower lung fields suggesting interstitial pneumonitis. Less likely possibility would be asymmetric pulmonary edema. Electronically Signed   By: Elmer Picker M.D.   On: 01/06/2021 11:13   DG Shoulder Left  Result Date: 01/17/2021 CLINICAL DATA:  Shoulder pain EXAM: LEFT SHOULDER - 2+ VIEW COMPARISON:  01/12/2021 chest x-ray FINDINGS: Limited two-view exam. There is anterior dislocation of the humerus in relation to the glenoid fossa. No displaced fracture. AC joint aligned. Included chest demonstrates left subclavian 2 lead pacer. Right PICC line tip lower SVC level. Heart is enlarged. IMPRESSION: Left shoulder anterior dislocation. Electronically Signed   By: Jerilynn Mages.  Shick M.D.   On: 01/17/2021 10:17   DG C-Arm 1-60 Min-No Report  Result Date: 01/04/2021 Fluoroscopy was utilized by the requesting physician.  No radiographic interpretation.   DG C-Arm 1-60 Min-No Report  Result Date: 01/04/2021 Fluoroscopy was utilized by the requesting physician.  No radiographic interpretation.   DG HIP UNILAT WITH PELVIS 2-3 VIEWS RIGHT  Result Date: 12/19/2020 CLINICAL DATA:  Right hip and back pain after multiple falls. EXAM: DG HIP (WITH OR WITHOUT PELVIS) 2-3V RIGHT COMPARISON:  12/04/2020. FINDINGS: No fracture or bone lesion. Hip joints, SI joints and symphysis pubis are normally spaced and aligned. Left lower quadrant stimulator device and right mid pelvic lead are unchanged. Soft tissues otherwise unremarkable. IMPRESSION: 1. No fracture or acute finding. Electronically Signed   By: Lajean Manes M.D.   On: 12/19/2020 11:07   VAS [place] LOWER EXTREMITY VENOUS (DVT)  Result Date: 12/19/2020  Lower Venous DVT Study Patient Name:   TERESEA SHOFNER  Date of Exam:   12/19/2020 Medical Rec #: [id number]       Accession #:    [id number] Date of Birth: 1945-07-23       Patient Gender: F Patient Age:   71 years Exam Location:  [facility] Procedure:      VAS [place] LOWER EXTREMITY VENOUS (DVT) Referring Phys: RAVI PAHWANI --------------------------------------------------------------------------------  Indications: Edema.  Risk Factors: CHF. Limitations: Body habitus and patient intolerance to compression at some segments. Comparison Study: No prior studies. Performing Technologist: Darlin Coco RDMS, RVT  Examination Guidelines: A complete evaluation includes B-mode imaging, spectral Doppler, color Doppler, and power Doppler as needed of all accessible portions of each vessel. Bilateral testing is considered an integral part of a complete examination. Limited examinations for reoccurring indications may be performed as noted. The reflux portion of the exam is performed with the patient in reverse Trendelenburg.  +---------+---------------+---------+-----------+----------+-------------------+  RIGHT     Compressibility Phasicity Spontaneity Properties Thrombus Aging       +---------+---------------+---------+-----------+----------+-------------------+  CFV                       Yes       Yes                    Unable to compress  due to patient                                                                   discomfort           +---------+---------------+---------+-----------+----------+-------------------+  SFJ       Full                                                                  +---------+---------------+---------+-----------+----------+-------------------+  FV Prox   Full                                                                  +---------+---------------+---------+-----------+----------+-------------------+  FV Mid    Full                                                                   +---------+---------------+---------+-----------+----------+-------------------+  FV Distal Full                                                                  +---------+---------------+---------+-----------+----------+-------------------+  PFV       Full                                                                  +---------+---------------+---------+-----------+----------+-------------------+  POP       Full            Yes       Yes                                         +---------+---------------+---------+-----------+----------+-------------------+  PTV       Full                                                                  +---------+---------------+---------+-----------+----------+-------------------+  PERO      Full                                                                  +---------+---------------+---------+-----------+----------+-------------------+   +---------+---------------+---------+-----------+----------+--------------+  LEFT      Compressibility Phasicity Spontaneity Properties Thrombus Aging  +---------+---------------+---------+-----------+----------+--------------+  CFV       Full            Yes       Yes                                    +---------+---------------+---------+-----------+----------+--------------+  SFJ       Full                                                             +---------+---------------+---------+-----------+----------+--------------+  FV Prox   Full                                                             +---------+---------------+---------+-----------+----------+--------------+  FV Mid    Full                                                             +---------+---------------+---------+-----------+----------+--------------+  FV Distal Full                                                             +---------+---------------+---------+-----------+----------+--------------+  PFV       Full                                                              +---------+---------------+---------+-----------+----------+--------------+  POP       Full            Yes       Yes                                    +---------+---------------+---------+-----------+----------+--------------+  PTV       Full                                                             +---------+---------------+---------+-----------+----------+--------------+  PERO      Full                                                             +---------+---------------+---------+-----------+----------+--------------+  Summary: RIGHT: - There is no evidence of deep vein thrombosis in the lower extremity. However, portions of this examination were limited- see technologist comments above.  - Cystic structure found in the popliteal fossa.  LEFT: - There is no evidence of deep vein thrombosis in the lower extremity.  - No cystic structure found in the popliteal fossa.  *See table(s) above for measurements and observations. Electronically signed by Orlie Pollen on 12/19/2020 at 10:36:03 AM.    Final    VAS [place] UPPER EXTREMITY VENOUS DUPLEX  Result Date: 01/13/2021 UPPER VENOUS STUDY  Patient Name:  ELEASHA RESTO  Date of Exam:   01/13/2021 Medical Rec #: [id number]       Accession #:    [id number] Date of Birth: [birth year]       Patient Gender: F Patient Age:   72 years Exam Location:  [facility] Procedure:      VAS [place] UPPER EXTREMITY VENOUS DUPLEX Referring Phys: CHING TU --------------------------------------------------------------------------------  Indications: Pain, and Swelling Risk Factors: Surgery 01-04-2021 Lumbar. Limitations: Multiple bandages, wounds, and patient pain tolerance. Comparison       01-01-2021 Prior left upper extremity venous was negative for Study:           DVT. Performing Technologist: Darlin Coco RDMS, RVT  Examination Guidelines: A complete evaluation includes B-mode imaging, spectral Doppler, color Doppler, and power Doppler as needed of  all accessible portions of each vessel. Bilateral testing is considered an integral part of a complete examination. Limited examinations for reoccurring indications may be performed as noted.  Right Findings: +----------+------------+---------+-----------+----------+-------+  RIGHT      Compressible Phasicity Spontaneous Properties Summary  +----------+------------+---------+-----------+----------+-------+  Subclavian                 Yes        Yes                         +----------+------------+---------+-----------+----------+-------+  Left Findings: +----------+------------+---------+-----------+----------+---------------------+  LEFT       Compressible Phasicity Spontaneous Properties        Summary         +----------+------------+---------+-----------+----------+---------------------+  IJV            Full        Yes        Yes                                       +----------+------------+---------+-----------+----------+---------------------+  Subclavian                 Yes        Yes                                       +----------+------------+---------+-----------+----------+---------------------+  Axillary       Full        Yes        Yes                                       +----------+------------+---------+-----------+----------+---------------------+  Brachial       Full        Yes        Yes  Some segments not                                                               well visualized due                                                             to bandaging and pain  +----------+------------+---------+-----------+----------+---------------------+  Radial                     Yes        Yes                 Unable to compress                                                                   due to pain       +----------+------------+---------+-----------+----------+---------------------+  Ulnar                      Yes        Yes                 Unable to compress                                                                    due to pain       +----------+------------+---------+-----------+----------+---------------------+  Cephalic       None        No         No                         Acute          +----------+------------+---------+-----------+----------+---------------------+  Basilic                    Yes        Yes                 Unable to compress                                                                   due to pain       +----------+------------+---------+-----------+----------+---------------------+  Summary:  Right: No evidence of thrombosis in the subclavian.  Left: No evidence of deep vein thrombosis in the upper extremity; however, portions of  today's examination were limited. See technologist comments above. Findings consistent with acute superficial vein thrombosis involving the left cephalic vein at the level of the forearm.  *See table(s) above for measurements and observations.  Diagnosing physician: Orlie Pollen Electronically signed by Orlie Pollen on 01/13/2021 at 3:49:35 PM.    Final    VAS [place] UPPER EXTREMITY VENOUS DUPLEX  Result Date: 01/03/2021 UPPER VENOUS STUDY  Patient Name:  BRONISLAWA FERRIER  Date of Exam:   01/01/2021 Medical Rec #: [id number]       Accession #:    [id number] Date of Birth: 08/13/1945       Patient Gender: F Patient Age:   69 years Exam Location:  [facility] Procedure:      VAS [place] UPPER EXTREMITY VENOUS DUPLEX Referring Phys: Oren Binet --------------------------------------------------------------------------------  Indications: Swelling Limitations: Bandages. Comparison Study: no prior Performing Technologist: Archie Patten RVS  Examination Guidelines: A complete evaluation includes B-mode imaging, spectral Doppler, color Doppler, and power Doppler as needed of all accessible portions of each vessel. Bilateral testing is considered an integral part of a complete examination. Limited examinations for reoccurring  indications may be performed as noted.  Right Findings: +----------+------------+---------+-----------+----------+-------+  RIGHT      Compressible Phasicity Spontaneous Properties Summary  +----------+------------+---------+-----------+----------+-------+  Subclavian                 Yes        Yes                         +----------+------------+---------+-----------+----------+-------+  Left Findings: +----------+------------+---------+-----------+----------+-------+  LEFT       Compressible Phasicity Spontaneous Properties Summary  +----------+------------+---------+-----------+----------+-------+  IJV            Full        Yes        Yes                         +----------+------------+---------+-----------+----------+-------+  Subclavian     Full        Yes        Yes                         +----------+------------+---------+-----------+----------+-------+  Axillary       Full        Yes        Yes                         +----------+------------+---------+-----------+----------+-------+  Brachial       Full        Yes        Yes                         +----------+------------+---------+-----------+----------+-------+  Radial         Full                                               +----------+------------+---------+-----------+----------+-------+  Ulnar          Full                                               +----------+------------+---------+-----------+----------+-------+  Cephalic       Full                                               +----------+------------+---------+-----------+----------+-------+  Summary:  Right: No evidence of thrombosis in the subclavian.  Left: No evidence of deep vein thrombosis in the upper extremity. No evidence of superficial vein thrombosis in the upper extremity.  *See table(s) above for measurements and observations.  Diagnosing physician: Harold Barban MD Electronically signed by Harold Barban MD on 01/03/2021 at 7:41:50 PM.    Final    ECHOCARDIOGRAM  LIMITED  Result Date: 12/21/2020    ECHOCARDIOGRAM LIMITED REPORT   Patient Name:   ROBEN FRERICH Date of Exam: 12/21/2020 Medical Rec #:  [id number]      Height:       60.0 in Accession #:    [id number]     Weight:       214.3 lb Date of Birth:  1945-10-22      BSA:          1.922 m Patient Age:    89 years       BP:           99/65 mmHg Patient Gender: F              HR:           123 bpm. Exam Location:  Inpatient Procedure: Limited Echo, Color Doppler and Cardiac Doppler Indications:    Bacteremia  History:        Patient has prior history of Echocardiogram examinations, most                 recent 11/24/2020. CHF, Arrythmias:Dysrhythmia; Risk                 Factors:Diabetes.  Sonographer:    Maudry Mayhew MHA, RDMS, RVT, RDCS Referring Phys: [place]  1. LVEF appears moderately depressed. Endocardium is not well seen in all segments. WOuld recomm limited echo with Definity to confirm wall motion and LVEF . Left ventricular diastolic parameters are indeterminate.  2. Right ventricular systolic function is normal. The right ventricular size is mildly enlarged.  3. Right atrial size was mildly dilated.  4. Trivial mitral valve regurgitation.  5. Tricuspid valve regurgitation is moderate.  6. The aortic valve is tricuspid. Aortic valve regurgitation is not visualized. Aortic valve sclerosis is present, with no evidence of aortic valve stenosis. FINDINGS  Left Ventricle: LVEF appears moderately depressed. Endocardium is not well seen in all segments. WOuld recomm limited echo with Definity to confirm wall motion and LVEF. The left ventricular internal cavity size was normal in size. Left ventricular diastolic parameters are indeterminate. Right Ventricle: The right ventricular size is mildly enlarged. Right vetricular wall thickness was not assessed. Right ventricular systolic function is normal. Left Atrium: Left atrial size was normal in size. Right Atrium: Right atrial size was  mildly dilated. Pericardium: There is no evidence of pericardial effusion. Mitral Valve: There is mild thickening of the mitral valve leaflet(s). Mild to moderate mitral annular calcification. Trivial mitral valve regurgitation. Tricuspid Valve: The tricuspid valve is normal in structure. Tricuspid valve regurgitation is moderate. Aortic Valve: The aortic valve is tricuspid. Aortic valve regurgitation is not visualized. Aortic valve sclerosis is present, with no evidence of aortic valve stenosis. Pulmonic Valve: The  pulmonic valve was grossly normal. Pulmonic valve regurgitation is not visualized. Additional Comments: A device lead is visualized. TRICUSPID VALVE TR Peak grad:   40.4 mmHg TR Vmax:        318.00 cm/s Dietrich Pates MD Electronically signed by Dietrich Pates MD Signature Date/Time: 12/21/2020/5:00:32 PM    Final    [place] EKG SITE RITE  Result Date: 01/07/2021 If Site Rite image not attached, placement could not be confirmed due to current cardiac rhythm.   Labs: Recent Labs    01/16/21 0302 01/17/21 0326  WBC 4.6 5.0  RBC 2.51* 2.52*  HCT 23.2* 23.2*  PLT 300 326   Recent Labs    01/16/21 0302 01/17/21 0326  NA 131* 132*  K 3.3* 3.7  CL 101 104  CO2 24 23  BUN 8 7*  CREATININE 0.65 0.59  GLUCOSE 127* 130*  CALCIUM 6.7* 6.7*   No results for input(s): LABPT, INR in the last 72 hours.  Review of Systems: ROS as detailed in HPI  Physical Exam: Body mass index is 36.29 kg/m.  Gen: AAOx2, NAD  Left upper extremity: Skin intact Fullness noted in left anterior shoulder TTP over humeral head Compartments soft and compressible Limited passive and active ROM AIN/PIN/Ulnar motor intact SILT throughout Radial, ulnar 2+ to palp CR<2s   Imaging: Radiographs of the left shoulder taken 01/17/21 demonstrate anterior dislocation of the left glenohumeral joint without any visible fractures.  Assessment and Plan: The patient is a 28 yr F consult to Ortho for left shoulder  dislocation of uncertain duration with recent urosepsis (on cefepime currently) and altered mental status (improving per primary team). PMH notable for DM2, PA , not on anticoagulation due to anemia per last discharge summary, sinus pause s/p pacemaker, CHF with preserved EF, hypothyroidism  -the patient has left anterior shoulder dislocation of uncertain duration, at least since 01/12/21 as seen on chest x-ray from that date  -she is neurovascularly intact and otherwise comfortable at rest, pain only with attempted ROM -will review her case with her shoulder surgeon Dr. Rennis Chris and update with recommendations. 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

## 2021-01-17 NOTE — Progress Notes (Addendum)
PROGRESS NOTE    Jillian Leblanc  [id number] DOB: [birth year] DOA: 01/12/2021 PCP: Guadlupe Spanish, MD   Brief Narrative:  75 year old with history of DM2, PAF , not on anticoagulation due to anemia per last discharge summary, sinus pause status post pacemaker, CHF with preserved EF, hypothyroidism admitted for altered mental status.  Patient was discharged 5 days ago after being treated for E. coli bacteremia, lumbar osteomyelitis with epidural abscess underwent laminectomy with evacuation of abscess.  Discharged on IV Ancef until 03/01/2021 by ID.  Also had urinary retention therefore was discharged on Foley catheter.  At SNF patient became confused having auditory and visual hallucination therefore brought to the hospital and was found to have sepsis secondary to UTI/CAUTI and multiple electrolyte imbalance.   Assessment & Plan:   Principal Problem:   Sepsis ([place]) Active Problems:   Hyponatremia   Hypokalemia   Elevated troponin   Hypothyroidism   Acute metabolic encephalopathy   Chronic diastolic CHF (congestive heart failure) (HCC)   Hypocalcemia   Hypomagnesemia   Sepsis secondary to UTI ([place])   Acute urinary retention   Urosepsis secondary to indwelling catheter; CAUTI -Blood Cultures- No growth. Urine Cx positive for Pseudomonas, empiric antibiotics- Cefepime, plan for total 7 days treatment -Foley exchanged on 12/21 in ED    Acute metabolic encephalopathy secondary to UTI w/indwelling catheter  -Mentation improved. Aaox3, continue to have sundowning in the afternoon, per RN reports   Elevated troponin  -Demand ischemia, remained flat.   Hyponatremia -seen by nephrology during recent hospitalization, appear responded to iv lasix/salt tabs and iv albumin , fluids restriction -urine sodium was 13 , resume salt tabs, fluid restriction, improving  -Close monitor volume status, Monitor oral intake, may need IV Lasix/LV albumin    Hypokalemia/hypomagnesemia/hypocalcemia -continue to replace   Left UE edema  -Venous Dopplers- SVT of the left arm, but no DVT.   12/25, appear to have Left shoulder pain with  limited range of motion, could not abduct left arm, will get left shoulder x ray  Addendum: left shoulder x ray showed left shoulder anterior dislocation [facility] consult requested through consult request answering service, I provided my personal cell phone number , awaiting for call back.  Addendum: [facility] Dr [place] Prey recommend kept patient npo aftermidnight, he will try to reduced the shoulder in room tomorrow on 12/26    History of recent E.coli bacteremia epidural abscess and laminectomy -Status post L4-L5 laminectomy with evacuation of epidural abscess on 12/12 with neurosurgery  - No active infection to healing surgical wound - Patient was placed on IV Ancef until 03/01/2021 by ID.Currently on IV cefepime for UTI, plan transition back to Ancef after treating UTI Picc line placed on 12/15    History of urinary retention -Foley catheter was placed prior to her discharge on 12/15.   -foley exchanged on 12/21  -Need outpatient urology follow-up   chronic diastolic heart failure Dehydrated, hold lasix.  Likely will have intravascular depletion due to low albumin Close monitor volume status She was put on 2liter oxygen in the ED, cxr at the time no acute findings, will try to wean oxygen, may need lasix with albumin if not able to wean oxygen She denies sob, no chest pain She has been bedridden due to back pain, will provide Incentive spirometer  Recently diagnosed PAF, was seen by cardiology last admission, anticoagulation issue to be addressed upon outpatient follow-up with cardiology  Anemia of chronic disease -Baseline hemoglobin 8.0.  No obvious signs  of bleeding. -FOBT ordered pending collection   Type 2 diabetes Last hemoglobin A1c of 8.4 in October -Sliding scale Accu-Cheks    Hypothyroidism Continue levothyroxine      DVT prophylaxis: Lovenox  Code Status: Full code Family Communication: I updated Daughter  over the phone on 12/24  Status is: Inpatient  Patient and family declined snf, wants to go home with home health, discharge once home health set up, she needs home IV abx infusion q8hrs She needs 24/7 care which family is aware     Body mass index is 36.29 kg/m.  Pressure Injury 12/19/20 Sacrum Medial Stage 2 -  Partial thickness loss of dermis presenting as a shallow open injury with a red, pink wound bed without slough. stage 2  sacrum  4cm x .5cm (Active)  12/19/20 1640  Location: Sacrum  Location Orientation: Medial  Staging: Stage 2 -  Partial thickness loss of dermis presenting as a shallow open injury with a red, pink wound bed without slough.  Wound Description (Comments): stage 2  sacrum  4cm x .5cm  Present on Admission: Yes      Subjective:  RN reports seeing her hallucinating in the evening, this morning she is aaox3, very appropriate,  She reports back pain, she is getting prn analgesics, she agreed to try lidocaine patch  She is on 2liter oxygen , she denies sob, no cough, no chest pain, mild pedal edema   Her left shoulder range of motion appear limited   Examination:  Constitutional: Not in acute distress, aaox3, +indwelling Foley Respiratory: Clear to auscultation bilaterally Cardiovascular: Normal sinus rhythm, no rubs Abdomen: Nontender nondistended good bowel sounds Musculoskeletal: left shoulder ROM limited, mild bilateral pedal edema Skin: No rashes seen Neurologic: CN 2-12 grossly intact.  And nonfocal Psychiatric: Normal judgment and insight. Alert and oriented x 3. Normal mood.  Foley in place.   Objective: Vitals:   01/16/21 1024 01/16/21 1630 01/16/21 2232 01/17/21 0400  BP: 129/89 (!) 131/117 127/77 110/83  Pulse: 90 96 95 75  Resp: 18 17 16 18   Temp: 98.2 F (36.8 C) 98.2 F (36.8 C) 99 F  (37.2 C) 98.6 F (37 C)  TempSrc:      SpO2: 100% 100% 100% 100%  Weight:      Height:        Intake/Output Summary (Last 24 hours) at 01/17/2021 0936 Last data filed at 01/17/2021 0502 Gross per 24 hour  Intake 657.68 ml  Output 2250 ml  Net -1592.32 ml   Filed Weights   01/12/21 1838  Weight: 84.3 kg     Data Reviewed:   CBC: Recent Labs  Lab 01/12/21 1907 01/14/21 0256 01/15/21 0510 01/16/21 0302 01/17/21 0326  WBC 7.6 7.1 6.6 4.6 5.0  NEUTROABS 5.0  --   --   --   --   HGB 8.7* 7.5* 7.4* 7.3* 7.5*  HCT 26.9* 22.9* 22.7* 23.2* 23.2*  MCV 88.8 89.5 91.9 92.4 92.1  PLT 208 258 287 300 326   Basic Metabolic Panel: Recent Labs  Lab 01/13/21 0459 01/13/21 0808 01/14/21 0256 01/15/21 0510 01/16/21 0302 01/17/21 0326  NA  --  128* 129* 128* 131* 132*  K  --  3.4* 4.1 3.5 3.3* 3.7  CL  --  92* 97* 98 101 104  CO2  --  25 24 25 24 23   GLUCOSE  --  129* 131* 114* 127* 130*  BUN  --  11 10 8 8  7*  CREATININE  --  0.73 0.68 0.72 0.65 0.59  CALCIUM  --  6.5* 6.4* 6.5* 6.7* 6.7*  MG 1.8  --  1.5* 1.9 1.7 1.8  PHOS  --  3.2 5.0*  --   --   --    GFR: Estimated Creatinine Clearance: 60.3 mL/min (by C-G formula based on SCr of 0.59 mg/dL). Liver Function Tests: Recent Labs  Lab 01/12/21 1907 01/16/21 0302  AST 23 19  ALT 6 5  ALKPHOS 189* 142*  BILITOT 1.1 0.9  PROT 6.1* 5.4*  ALBUMIN 2.1* 1.7*   No results for input(s): LIPASE, AMYLASE in the last 168 hours. No results for input(s): AMMONIA in the last 168 hours.  Coagulation Profile: Recent Labs  Lab 01/12/21 1907  INR 1.3*   Cardiac Enzymes: No results for input(s): CKTOTAL, CKMB, CKMBINDEX, TROPONINI in the last 168 hours. BNP (last 3 results) No results for input(s): PROBNP in the last 8760 hours. HbA1C: No results for input(s): HGBA1C in the last 72 hours. CBG: Recent Labs  Lab 01/15/21 2202 01/16/21 0710 01/16/21 1146 01/16/21 1633 01/17/21 0716  GLUCAP 146* 110* 116* 129* 110*    Lipid Profile: No results for input(s): CHOL, HDL, LDLCALC, TRIG, CHOLHDL, LDLDIRECT in the last 72 hours. Thyroid Function Tests: No results for input(s): TSH, T4TOTAL, FREET4, T3FREE, THYROIDAB in the last 72 hours. Anemia Panel: Recent Labs    01/16/21 0302  VITAMINB12 237  TIBC 123*  IRON 23*   Sepsis Labs: Recent Labs  Lab 01/12/21 1902 01/12/21 2107  LATICACIDVEN 1.8 1.2    Recent Results (from the past 240 hour(s))  Resp Panel by RT-PCR (Flu A&B, Covid) Nasopharyngeal Swab     Status: None   Collection Time: 01/07/21  1:48 PM   Specimen: Nasopharyngeal Swab; Nasopharyngeal(NP) swabs in vial transport medium  Result Value Ref Range Status   SARS Coronavirus 2 by RT PCR NEGATIVE NEGATIVE Final    Comment: (NOTE) SARS-CoV-2 target nucleic acids are NOT DETECTED.  The SARS-CoV-2 RNA is generally detectable in upper respiratory specimens during the acute phase of infection. The lowest concentration of SARS-CoV-2 viral copies this assay can detect is 138 copies/mL. A negative result does not preclude SARS-Cov-2 infection and should not be used as the sole basis for treatment or other patient management decisions. A negative result may occur with  improper specimen collection/handling, submission of specimen other than nasopharyngeal swab, presence of viral mutation(s) within the areas targeted by this assay, and inadequate number of viral copies(<138 copies/mL). A negative result must be combined with clinical observations, patient history, and epidemiological information. The expected result is Negative.  Fact Sheet for Patients:  EntrepreneurPulse.com.au  Fact Sheet for Healthcare Providers:  IncredibleEmployment.be  This test is no t yet approved or cleared by the [place] FDA and  has been authorized for detection and/or diagnosis of SARS-CoV-2 by FDA under an Emergency Use Authorization (EUA). This EUA will remain   in effect (meaning this test can be used) for the duration of the COVID-19 declaration under Section 564(b)(1) of the Act, 21 U.S.C.section 360bbb-3(b)(1), unless the authorization is terminated  or revoked sooner.       Influenza A by PCR NEGATIVE NEGATIVE Final   Influenza B by PCR NEGATIVE NEGATIVE Final    Comment: (NOTE) The Xpert Xpress SARS-CoV-2/FLU/RSV plus assay is intended as an aid in the diagnosis of influenza from Nasopharyngeal swab specimens and should not be used as a sole basis for treatment. Nasal washings and aspirates are unacceptable for Xpert  Xpress SARS-CoV-2/FLU/RSV testing.  Fact Sheet for Patients: EntrepreneurPulse.com.au  Fact Sheet for Healthcare Providers: IncredibleEmployment.be  This test is not yet approved or cleared by the [place] FDA and has been authorized for detection and/or diagnosis of SARS-CoV-2 by FDA under an Emergency Use Authorization (EUA). This EUA will remain in effect (meaning this test can be used) for the duration of the COVID-19 declaration under Section 564(b)(1) of the Act, 21 U.S.C. section 360bbb-3(b)(1), unless the authorization is terminated or revoked.  Performed at [facility], [place] [address], [place], [place] [address]   Urine Culture     Status: Abnormal   Collection Time: 01/12/21  1:00 PM   Specimen: In/Out Cath Urine  Result Value Ref Range Status   Specimen Description IN/OUT CATH URINE  Final   Special Requests   Final    NONE Performed at [facility], [place] [address], [place], [place] [address]    Culture >=100,000 COLONIES/mL PSEUDOMONAS AERUGINOSA (A)  Final   Report Status 01/15/2021 FINAL  Final   Organism ID, Bacteria PSEUDOMONAS AERUGINOSA (A)  Final      Susceptibility   Pseudomonas aeruginosa - MIC*    CEFTAZIDIME 2 SENSITIVE Sensitive     CIPROFLOXACIN <=0.25 SENSITIVE Sensitive     GENTAMICIN <=1 SENSITIVE Sensitive     IMIPENEM 2  SENSITIVE Sensitive     PIP/TAZO <=4 SENSITIVE Sensitive     CEFEPIME 2 SENSITIVE Sensitive     * >=100,000 COLONIES/mL PSEUDOMONAS AERUGINOSA  Resp Panel by RT-PCR (Flu A&B, Covid)     Status: None   Collection Time: 01/12/21  7:07 PM   Specimen: Nasopharyngeal(NP) swabs in vial transport medium  Result Value Ref Range Status   SARS Coronavirus 2 by RT PCR NEGATIVE NEGATIVE Final    Comment: (NOTE) SARS-CoV-2 target nucleic acids are NOT DETECTED.  The SARS-CoV-2 RNA is generally detectable in upper respiratory specimens during the acute phase of infection. The lowest concentration of SARS-CoV-2 viral copies this assay can detect is 138 copies/mL. A negative result does not preclude SARS-Cov-2 infection and should not be used as the sole basis for treatment or other patient management decisions. A negative result may occur with  improper specimen collection/handling, submission of specimen other than nasopharyngeal swab, presence of viral mutation(s) within the areas targeted by this assay, and inadequate number of viral copies(<138 copies/mL). A negative result must be combined with clinical observations, patient history, and epidemiological information. The expected result is Negative.  Fact Sheet for Patients:  EntrepreneurPulse.com.au  Fact Sheet for Healthcare Providers:  IncredibleEmployment.be  This test is no t yet approved or cleared by the [place] FDA and  has been authorized for detection and/or diagnosis of SARS-CoV-2 by FDA under an Emergency Use Authorization (EUA). This EUA will remain  in effect (meaning this test can be used) for the duration of the COVID-19 declaration under Section 564(b)(1) of the Act, 21 U.S.C.section 360bbb-3(b)(1), unless the authorization is terminated  or revoked sooner.       Influenza A by PCR NEGATIVE NEGATIVE Final   Influenza B by PCR NEGATIVE NEGATIVE Final    Comment: (NOTE) The  Xpert Xpress SARS-CoV-2/FLU/RSV plus assay is intended as an aid in the diagnosis of influenza from Nasopharyngeal swab specimens and should not be used as a sole basis for treatment. Nasal washings and aspirates are unacceptable for Xpert Xpress SARS-CoV-2/FLU/RSV testing.  Fact Sheet for Patients: EntrepreneurPulse.com.au  Fact Sheet for Healthcare Providers: IncredibleEmployment.be  This  test is not yet approved or cleared by the [place] and has been authorized for detection and/or diagnosis of SARS-CoV-2 by FDA under an Emergency Use Authorization (EUA). This EUA will remain in effect (meaning this test can be used) for the duration of the COVID-19 declaration under Section 564(b)(1) of the Act, 21 U.S.C. section 360bbb-3(b)(1), unless the authorization is terminated or revoked.  Performed at [facility], [place] [address], [place], [place] [address]   Blood Culture (routine x 2)     Status: None (Preliminary result)   Collection Time: 01/12/21  7:07 PM   Specimen: BLOOD  Result Value Ref Range Status   Specimen Description BLOOD LEFT ANTECUBITAL  Final   Special Requests   Final    BOTTLES DRAWN AEROBIC AND ANAEROBIC Blood Culture adequate volume   Culture   Final    NO GROWTH 4 DAYS Performed at [facility], [place] [address], [place], [place] [address]    Report Status PENDING  Incomplete  Blood Culture (routine x 2)     Status: None (Preliminary result)   Collection Time: 01/13/21  8:08 AM   Specimen: BLOOD  Result Value Ref Range Status   Specimen Description BLOOD LEFT ANTECUBITAL  Final   Special Requests   Final    BOTTLES DRAWN AEROBIC AND ANAEROBIC Blood Culture adequate volume   Culture   Final    NO GROWTH 3 DAYS Performed at [facility], [place] [address], [place], [place] [address]    Report Status PENDING  Incomplete         Radiology Studies: No results found.      Scheduled  Meds:  calcium-vitamin D  1 tablet Oral BID WC   Chlorhexidine Gluconate Cloth  6 each Topical Daily   enoxaparin (LOVENOX) injection  40 mg Subcutaneous [id number]   folic acid  1 mg Oral Daily   gabapentin  300 mg Oral BID   insulin aspart  0-9 Units Subcutaneous TID WC   levothyroxine  150 mcg Oral QAC breakfast   lidocaine  1 patch Transdermal Q24H   pantoprazole  40 mg Oral Daily   potassium chloride  40 mEq Oral Daily   pramipexole  1.5 mg Oral TID   QUEtiapine  25 mg Oral QHS   sodium chloride flush  10-40 mL Intracatheter Q12H   sodium chloride  1 g Oral BID WC   vitamin B-12  1,000 mcg Oral Daily   Continuous Infusions:  ceFEPime (MAXIPIME) IV 2 g (01/17/21 0930)   magnesium sulfate bolus IVPB       LOS: 5 days     Florencia Reasons, MD PhD FACP [facility]  If 7PM-7AM, please contact night-coverage  01/17/2021, 9:36 AM

## 2021-01-18 ENCOUNTER — Inpatient Hospital Stay (HOSPITAL_COMMUNITY): Payer: Medicare Other

## 2021-01-18 DIAGNOSIS — R6520 Severe sepsis without septic shock: Secondary | ICD-10-CM

## 2021-01-18 DIAGNOSIS — T148XXA Other injury of unspecified body region, initial encounter: Secondary | ICD-10-CM

## 2021-01-18 DIAGNOSIS — R410 Disorientation, unspecified: Secondary | ICD-10-CM

## 2021-01-18 DIAGNOSIS — G9340 Encephalopathy, unspecified: Secondary | ICD-10-CM

## 2021-01-18 DIAGNOSIS — R404 Transient alteration of awareness: Secondary | ICD-10-CM

## 2021-01-18 DIAGNOSIS — N390 Urinary tract infection, site not specified: Secondary | ICD-10-CM

## 2021-01-18 DIAGNOSIS — R4182 Altered mental status, unspecified: Secondary | ICD-10-CM

## 2021-01-18 DIAGNOSIS — M25512 Pain in left shoulder: Secondary | ICD-10-CM

## 2021-01-18 DIAGNOSIS — M79602 Pain in left arm: Secondary | ICD-10-CM

## 2021-01-18 DIAGNOSIS — M7989 Other specified soft tissue disorders: Secondary | ICD-10-CM

## 2021-01-18 LAB — MAGNESIUM
Magnesium: 2 mg/dL (ref 1.7–2.4)
Magnesium: 1.6 mg/dL — ABNORMAL LOW (ref 1.7–2.4)

## 2021-01-18 LAB — CBC WITH DIFFERENTIAL/PLATELET
Abs Immature Granulocytes: 0.05 10*3/uL (ref 0.00–0.07)
Basophils Absolute: 0 10*3/uL (ref 0.0–0.1)
Basophils Relative: 0 %
Eosinophils Absolute: 0.1 10*3/uL (ref 0.0–0.5)
Eosinophils Relative: 1 %
HCT: 24.1 % — ABNORMAL LOW (ref 36.0–46.0)
Hemoglobin: 7.6 g/dL — ABNORMAL LOW (ref 12.0–15.0)
Immature Granulocytes: 1 %
Lymphocytes Relative: 26 %
Lymphs Abs: 1.4 10*3/uL (ref 0.7–4.0)
MCH: 29.1 pg (ref 26.0–34.0)
MCHC: 31.5 g/dL (ref 30.0–36.0)
MCV: 92.3 fL (ref 80.0–100.0)
Monocytes Absolute: 0.5 10*3/uL (ref 0.1–1.0)
Monocytes Relative: 9 %
Neutro Abs: 3.3 10*3/uL (ref 1.7–7.7)
Neutrophils Relative %: 63 %
Platelets: 332 10*3/uL (ref 150–400)
RBC: 2.61 MIL/uL — ABNORMAL LOW (ref 3.87–5.11)
RDW: 18.1 % — ABNORMAL HIGH (ref 11.5–15.5)
WBC: 5.4 10*3/uL (ref 4.0–10.5)
nRBC: 0 % (ref 0.0–0.2)

## 2021-01-18 LAB — COMPREHENSIVE METABOLIC PANEL
ALT: 7 U/L (ref 0–44)
AST: 28 U/L (ref 15–41)
Albumin: 1.7 g/dL — ABNORMAL LOW (ref 3.5–5.0)
Alkaline Phosphatase: 183 U/L — ABNORMAL HIGH (ref 38–126)
Anion gap: 7 (ref 5–15)
BUN: 8 mg/dL (ref 8–23)
CO2: 20 mmol/L — ABNORMAL LOW (ref 22–32)
Calcium: 6.9 mg/dL — ABNORMAL LOW (ref 8.9–10.3)
Chloride: 105 mmol/L (ref 98–111)
Creatinine, Ser: 0.61 mg/dL (ref 0.44–1.00)
GFR, Estimated: >60 mL/min (ref >60)
Glucose, Bld: 136 mg/dL — ABNORMAL HIGH (ref 70–99)
Potassium: 4 mmol/L (ref 3.5–5.1)
Sodium: 132 mmol/L — ABNORMAL LOW (ref 135–145)
Total Bilirubin: 1 mg/dL (ref 0.3–1.2)
Total Protein: 5.9 g/dL — ABNORMAL LOW (ref 6.5–8.1)

## 2021-01-18 LAB — BASIC METABOLIC PANEL
Anion gap: 6 (ref 5–15)
BUN: 8 mg/dL (ref 8–23)
CO2: 22 mmol/L (ref 22–32)
Calcium: 6.8 mg/dL — ABNORMAL LOW (ref 8.9–10.3)
Chloride: 105 mmol/L (ref 98–111)
Creatinine, Ser: 0.74 mg/dL (ref 0.44–1.00)
GFR, Estimated: >60 mL/min (ref >60)
Glucose, Bld: 110 mg/dL — ABNORMAL HIGH (ref 70–99)
Potassium: 4.3 mmol/L (ref 3.5–5.1)
Sodium: 133 mmol/L — ABNORMAL LOW (ref 135–145)

## 2021-01-18 LAB — CULTURE, BLOOD (ROUTINE X 2)
Culture: NO GROWTH
Special Requests: ADEQUATE

## 2021-01-18 LAB — GLUCOSE, CAPILLARY
Glucose-Capillary: 100 mg/dL — ABNORMAL HIGH (ref 70–99)
Glucose-Capillary: 101 mg/dL — ABNORMAL HIGH (ref 70–99)
Glucose-Capillary: 171 mg/dL — ABNORMAL HIGH (ref 70–99)
Glucose-Capillary: 129 mg/dL — ABNORMAL HIGH (ref 70–99)

## 2021-01-18 LAB — CBC
HCT: 22.9 % — ABNORMAL LOW (ref 36.0–46.0)
Hemoglobin: 7.2 g/dL — ABNORMAL LOW (ref 12.0–15.0)
MCH: 29.6 pg (ref 26.0–34.0)
MCHC: 31.4 g/dL (ref 30.0–36.0)
MCV: 94.2 fL (ref 80.0–100.0)
Platelets: 335 10*3/uL (ref 150–400)
RBC: 2.43 MIL/uL — ABNORMAL LOW (ref 3.87–5.11)
RDW: 18 % — ABNORMAL HIGH (ref 11.5–15.5)
WBC: 4.6 10*3/uL (ref 4.0–10.5)
nRBC: 0 % (ref 0.0–0.2)

## 2021-01-18 LAB — T4, FREE: Free T4: 2.23 ng/dL — ABNORMAL HIGH (ref 0.61–1.12)

## 2021-01-18 LAB — TSH: TSH: 1.796 u[IU]/mL (ref 0.350–4.500)

## 2021-01-18 LAB — TROPONIN I (HIGH SENSITIVITY): Troponin I (High Sensitivity): 112 ng/L (ref <18)

## 2021-01-18 NOTE — Progress Notes (Signed)
Upper extremity venous has been completed.   Preliminary results in CV Proc.   Jillian Leblanc 01/18/2021 2:35 PM

## 2021-01-18 NOTE — Care Plan (Signed)
PLAN OF CARE Orthopaedic Surgery  -Ortho was consulted for left shoulder dislocation noted during admission (please refer that consult note dated 01/17/21 for full details) -case reviewed with Dr. 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 Therefore, she is not currently a candidate for surgery -as per Dr. Dub Mikes recommendations: no acute surgical intervention, pt is to follow up with him as outpatient, sling for comfort when out of bed, ok to use left upper extremity within pain tolerance -Ortho will sign off at this time  Netta Cedars, MD Orthopaedic Surgery [facility]

## 2021-01-18 NOTE — Progress Notes (Signed)
PROGRESS NOTE    Jillian Leblanc  [id number] DOB: 04/22/1945 DOA: 01/12/2021 PCP: Guadlupe Spanish, MD     Brief Narrative:  75 year old with history of DM2, PAF , not on anticoagulation due to anemia per last discharge summary, sinus pause status post pacemaker, CHF with preserved EF, hypothyroidism admitted for altered mental status.  Patient was discharged 5 days prior to this admission, after being treated for E. coli bacteremia, lumbar osteomyelitis with epidural abscess underwent laminectomy with evacuation of abscess.  Discharged on IV Ancef until 03/01/2021 by ID.  Also had urinary retention therefore was discharged on Foley catheter.  At SNF patient became confused having auditory and visual hallucination therefore brought to the hospital and was found to have sepsis secondary to UTI/CAUTI and multiple electrolyte imbalance.  Assessment & Plan:   Principal Problem:   Sepsis ([place]) Active Problems:   Hyponatremia   Hypokalemia   Elevated troponin   Hypothyroidism   Acute metabolic encephalopathy   Chronic diastolic CHF (congestive heart failure) (HCC)   Hypocalcemia   Hypomagnesemia   Sepsis secondary to UTI ([place])   Acute urinary retention  Urosepsis secondary to indwelling catheter; CAUTI -Blood Cultures- No growth. Urine Cx positive for Pseudomonas, empiric antibiotics- Cefepime, plan for total 7 days treatment (last dose 01/21/21 -Foley exchanged on 12/21 in ED     Acute metabolic encephalopathy secondary to UTI w/indwelling catheter, possible delirium d/t pain from L anterior shoulder disclocation -Mentation was reportedly improved and was Aaox3 per note yesterday, however nursing reports sundowning behavior and daughter is concerned that patient is very confused, on my exam she is oriented to place and person but she is mumbling and yelling over and over same asking for daddy, she thinks her daughter is her mother, etc.  -Repeat labs and blood culture, will get TSH w/  reflex but this may of course be abnormal due to acute illness but if dramatically abnormal will attempt to adjust thyroid meds, will repeat troponins and EKG. Pt has not received morphine, hold this is possible but pain is significant in L shoulder. Will hold Seroquel and trazodone for tonight    L shoulder pain and arm edema d/t anterior shoulder dislocation -ortho was planning for reduction today but their note is pending.  -edema concerning since i'm unaware baseline, seems to be weeping, [place] [id number] (+)cephalic SVT butthis has resolved on repeat US today. WIll check up w/ ortho on their recs   Elevated troponin  -Demand ischemia, remained flat. -repeating now given delirium    Hyponatremia -seen by nephrology during recent hospitalization, appear responded to iv lasix/salt tabs and iv albumin , fluids restriction -repeating labs    Hypokalemia/hypomagnesemia/hypocalcemia -continue to replace  History of recent E.coli bacteremia epidural abscess and laminectomy -Status post L4-L5 laminectomy with evacuation of epidural abscess on 12/12 with neurosurgery  - No active infection to healing surgical wound - Patient was placed on IV Ancef until 03/01/2021 by ID.Currently on IV cefepime for UTI, plan transition back to Ancef after treating UTI Picc line placed on 12/15     History of urinary retention -Foley catheter was placed prior to her discharge on 12/15.   -foley exchanged on 12/21  -Need outpatient urology follow-up    chronic diastolic heart failure Dehydrated, held lasix.  Likely will have intravascular depletion due to low albumin Close monitor volume status She was put on 2liter oxygen in the ED, cxr at the time no acute findings, will try to wean oxygen, may need lasix  with albumin if not able to wean oxygen She denies sob, no chest pain She has been bedridden due to back pain, will provide Incentive spirometer   Recently diagnosed PAF, was seen by cardiology last  admission, anticoagulation issue to be addressed upon outpatient follow-up with cardiology   Anemia of chronic disease -Baseline hemoglobin 8.0.  No obvious signs of bleeding. -FOBT ordered pending collection   Type 2 diabetes Last hemoglobin A1c of 8.4 in October -Sliding scale Accu-Cheks   Hypothyroidism Continue levothyroxine     DVT prophylaxis: (Lovenox/Heparin/SCD's/anticoagulated/None (if comfort care) Code Status: (Full/Partial - specify details) Family Communication: (Specify name, relationship & date discussed. NO "discussed with patient") Disposition Plan: (specify when and where you expect patient to be discharged)    Antimicrobials Anti-infectives (From admission, onward)    Start     Dose/Rate Route Frequency Ordered Stop   01/14/21 0900  ceFEPIme (MAXIPIME) 2 g in sodium chloride 0.9 % 100 mL IVPB        2 g 200 mL/hr over 30 Minutes Intravenous Every 8 hours 01/14/21 0749     01/13/21 2100  vancomycin (VANCOCIN) IVPB 1000 mg/200 mL premix  Status:  Discontinued        1,000 mg 200 mL/hr over 60 Minutes Intravenous Every 24 hours 01/12/21 2117 01/12/21 2313   01/13/21 0700  ceFEPIme (MAXIPIME) 2 g in sodium chloride 0.9 % 100 mL IVPB  Status:  Discontinued        2 g 200 mL/hr over 30 Minutes Intravenous Every 12 hours 01/12/21 2117 01/14/21 0749   01/12/21 2000  vancomycin (VANCOREADY) IVPB 1500 mg/300 mL        1,500 mg 150 mL/hr over 120 Minutes Intravenous  Once 01/12/21 1913 01/12/21 2316   01/12/21 1930  ceFEPIme (MAXIPIME) 2 g in sodium chloride 0.9 % 100 mL IVPB  Status:  Discontinued        2 g 200 mL/hr over 30 Minutes Intravenous Every 8 hours 01/12/21 1913 01/12/21 2117   01/12/21 1915  metroNIDAZOLE (FLAGYL) IVPB 500 mg        500 mg 100 mL/hr over 60 Minutes Intravenous  Once 01/12/21 1908 01/12/21 2050       Subjective: Seen today w/ daughter at bedside, pt oriented to place, she is moaning and yelling something about her daddy, she  recounts something about her shoulder surgery but then goes on about how her dad had same surgery and trails off.    Objective: Vitals:   01/17/21 1852 01/17/21 2045 01/18/21 0449 01/18/21 0921  BP: (!) 140/59 (!) 136/96 (!) 138/53 (!) 153/92  Pulse: 88 82 81 84  Resp: 16 18 18 18   Temp: 99.3 F (37.4 C) 99.6 F (37.6 C) 98.2 F (36.8 C) 99.5 F (37.5 C)  TempSrc:  Oral Oral Oral  SpO2: 98% 100% 95% 100%  Weight:      Height:        Intake/Output Summary (Last 24 hours) at 01/18/2021 1333 Last data filed at 01/18/2021 1230 Gross per 24 hour  Intake 180 ml  Output 1300 ml  Net -1120 ml   Filed Weights   01/12/21 1838  Weight: 84.3 kg    Examination:  General exam: Appears agitated  Respiratory system: Clear to auscultation. Respiratory effort normal. Cardiovascular system: S1 & S2 heard, RRR. No pedal edema. (+)L UE edema Gastrointestinal system: Abdomen is nondistended, soft and nontender. No organomegaly or masses felt. Normal bowel sounds heard. Central nervous system: Alert. No focal  neurological deficits. Extremities: Symmetric 5 x 5 power. Limited LUE movement d/t pain Skin: No rashes. Healing abrasion/cut on L forearm  Psychiatry: altered, delirious     Data Reviewed: I have personally reviewed following labs and imaging studies  CBC: Recent Labs  Lab 01/12/21 1907 01/14/21 0256 01/15/21 0510 01/16/21 0302 01/17/21 0326 01/18/21 0408  WBC 7.6 7.1 6.6 4.6 5.0 4.6  NEUTROABS 5.0  --   --   --   --   --   HGB 8.7* 7.5* 7.4* 7.3* 7.5* 7.2*  HCT 26.9* 22.9* 22.7* 23.2* 23.2* 22.9*  MCV 88.8 89.5 91.9 92.4 92.1 94.2  PLT 208 258 287 300 326 [id number]   Basic Metabolic Panel: Recent Labs  Lab 01/13/21 0808 01/14/21 0256 01/15/21 0510 01/16/21 0302 01/17/21 0326 01/18/21 0408  NA 128* 129* 128* 131* 132* 133*  K 3.4* 4.1 3.5 3.3* 3.7 4.3  CL 92* 97* 98 101 104 105  CO2 25 24 25 24 23 22   GLUCOSE 129* 131* 114* 127* 130* 110*  BUN 11 10 8 8  7* 8   CREATININE 0.73 0.68 0.72 0.65 0.59 0.74  CALCIUM 6.5* 6.4* 6.5* 6.7* 6.7* 6.8*  MG  --  1.5* 1.9 1.7 1.8 2.0  PHOS 3.2 5.0*  --   --   --   --    GFR: Estimated Creatinine Clearance: 60.3 mL/min (by C-G formula based on SCr of 0.74 mg/dL). Liver Function Tests: Recent Labs  Lab 01/12/21 1907 01/16/21 0302  AST 23 19  ALT 6 5  ALKPHOS 189* 142*  BILITOT 1.1 0.9  PROT 6.1* 5.4*  ALBUMIN 2.1* 1.7*   No results for input(s): LIPASE, AMYLASE in the last 168 hours. No results for input(s): AMMONIA in the last 168 hours. Coagulation Profile: Recent Labs  Lab 01/12/21 1907  INR 1.3*   Cardiac Enzymes: No results for input(s): CKTOTAL, CKMB, CKMBINDEX, TROPONINI in the last 168 hours. BNP (last 3 results) No results for input(s): PROBNP in the last 8760 hours. HbA1C: No results for input(s): HGBA1C in the last 72 hours. CBG: Recent Labs  Lab 01/17/21 1131 01/17/21 1638 01/17/21 2051 01/18/21 0639 01/18/21 1125  GLUCAP 172* 117* 138* 100* 101*   Lipid Profile: No results for input(s): CHOL, HDL, LDLCALC, TRIG, CHOLHDL, LDLDIRECT in the last 72 hours. Thyroid Function Tests: No results for input(s): TSH, T4TOTAL, FREET4, T3FREE, THYROIDAB in the last 72 hours. Anemia Panel: Recent Labs    01/16/21 0302  VITAMINB12 237  TIBC 123*  IRON 23*   Urine analysis:    Component Value Date/Time   COLORURINE YELLOW 01/12/2021 1907   APPEARANCEUR HAZY (A) 01/12/2021 1907   LABSPEC 1.014 01/12/2021 1907   PHURINE 6.0 01/12/2021 1907   GLUCOSEU 50 (A) 01/12/2021 1907   HGBUR SMALL (A) 01/12/2021 1907   BILIRUBINUR NEGATIVE 01/12/2021 1907   KETONESUR NEGATIVE 01/12/2021 1907   PROTEINUR 30 (A) 01/12/2021 1907   UROBILINOGEN 0.2 09/15/2012 1536   NITRITE POSITIVE (A) 01/12/2021 1907   LEUKOCYTESUR LARGE (A) 01/12/2021 1907   Sepsis Labs: @LABRCNTIP (procalcitonin:4,lacticidven:4)  ) Recent Results (from the past 240 hour(s))  Urine Culture     Status: Abnormal    Collection Time: 01/12/21  1:00 PM   Specimen: In/Out Cath Urine  Result Value Ref Range Status   Specimen Description IN/OUT CATH URINE  Final   Special Requests   Final    NONE Performed at [facility], [place] [address], [place], [place] [address]  Culture >=100,000 COLONIES/mL PSEUDOMONAS AERUGINOSA (A)  Final   Report Status 01/15/2021 FINAL  Final   Organism ID, Bacteria PSEUDOMONAS AERUGINOSA (A)  Final      Susceptibility   Pseudomonas aeruginosa - MIC*    CEFTAZIDIME 2 SENSITIVE Sensitive     CIPROFLOXACIN <=0.25 SENSITIVE Sensitive     GENTAMICIN <=1 SENSITIVE Sensitive     IMIPENEM 2 SENSITIVE Sensitive     PIP/TAZO <=4 SENSITIVE Sensitive     CEFEPIME 2 SENSITIVE Sensitive     * >=100,000 COLONIES/mL PSEUDOMONAS AERUGINOSA  Resp Panel by RT-PCR (Flu A&B, Covid)     Status: None   Collection Time: 01/12/21  7:07 PM   Specimen: Nasopharyngeal(NP) swabs in vial transport medium  Result Value Ref Range Status   SARS Coronavirus 2 by RT PCR NEGATIVE NEGATIVE Final    Comment: (NOTE) SARS-CoV-2 target nucleic acids are NOT DETECTED.  The SARS-CoV-2 RNA is generally detectable in upper respiratory specimens during the acute phase of infection. The lowest concentration of SARS-CoV-2 viral copies this assay can detect is 138 copies/mL. A negative result does not preclude SARS-Cov-2 infection and should not be used as the sole basis for treatment or other patient management decisions. A negative result may occur with  improper specimen collection/handling, submission of specimen other than nasopharyngeal swab, presence of viral mutation(s) within the areas targeted by this assay, and inadequate number of viral copies(<138 copies/mL). A negative result must be combined with clinical observations, patient history, and epidemiological information. The expected result is Negative.  Fact Sheet for Patients:  BloggerCourse.com  Fact Sheet for  Healthcare Providers:  SeriousBroker.it  This test is no t yet approved or cleared by the [place] FDA and  has been authorized for detection and/or diagnosis of SARS-CoV-2 by FDA under an Emergency Use Authorization (EUA). This EUA will remain  in effect (meaning this test can be used) for the duration of the COVID-19 declaration under Section 564(b)(1) of the Act, 21 U.S.C.section 360bbb-3(b)(1), unless the authorization is terminated  or revoked sooner.       Influenza A by PCR NEGATIVE NEGATIVE Final   Influenza B by PCR NEGATIVE NEGATIVE Final    Comment: (NOTE) The Xpert Xpress SARS-CoV-2/FLU/RSV plus assay is intended as an aid in the diagnosis of influenza from Nasopharyngeal swab specimens and should not be used as a sole basis for treatment. Nasal washings and aspirates are unacceptable for Xpert Xpress SARS-CoV-2/FLU/RSV testing.  Fact Sheet for Patients: BloggerCourse.com  Fact Sheet for Healthcare Providers: SeriousBroker.it  This test is not yet approved or cleared by the [place] FDA and has been authorized for detection and/or diagnosis of SARS-CoV-2 by FDA under an Emergency Use Authorization (EUA). This EUA will remain in effect (meaning this test can be used) for the duration of the COVID-19 declaration under Section 564(b)(1) of the Act, 21 U.S.C. section 360bbb-3(b)(1), unless the authorization is terminated or revoked.  Performed at [facility], [address], [place], [place] [address]   Blood Culture (routine x 2)     Status: None   Collection Time: 01/12/21  7:07 PM   Specimen: BLOOD  Result Value Ref Range Status   Specimen Description BLOOD LEFT ANTECUBITAL  Final   Special Requests   Final    BOTTLES DRAWN AEROBIC AND ANAEROBIC Blood Culture adequate volume   Culture   Final    NO GROWTH 5 DAYS Performed at [facility], [address] [place] [address],  [place], [place]  [id number]    Report Status 01/17/2021 FINAL  Final  Blood Culture (routine x 2)     Status: None   Collection Time: 01/13/21  8:08 AM   Specimen: BLOOD  Result Value Ref Range Status   Specimen Description BLOOD LEFT ANTECUBITAL  Final   Special Requests   Final    BOTTLES DRAWN AEROBIC AND ANAEROBIC Blood Culture adequate volume   Culture   Final    NO GROWTH 5 DAYS Performed at [facility], [place] [address], [place], [place] [address]    Report Status 01/18/2021 FINAL  Final         Radiology Studies: DG Shoulder Left  Result Date: 01/17/2021 CLINICAL DATA:  Shoulder pain EXAM: LEFT SHOULDER - 2+ VIEW COMPARISON:  01/12/2021 chest x-ray FINDINGS: Limited two-view exam. There is anterior dislocation of the humerus in relation to the glenoid fossa. No displaced fracture. AC joint aligned. Included chest demonstrates left subclavian 2 lead pacer. Right PICC line tip lower SVC level. Heart is enlarged. IMPRESSION: Left shoulder anterior dislocation. Electronically Signed   By: Jerilynn Mages.  Shick M.D.   On: 01/17/2021 10:17   DG Shoulder Left Port  Result Date: 01/18/2021 CLINICAL DATA:  Left shoulder dislocation with EXAM: LEFT SHOULDER COMPARISON:  Earlier radiograph dated 01/17/2021. FINDINGS: Persistent anterior dislocation of the left shoulder. No obvious acute fracture. The bones are osteopenic. Left pectoral pacemaker device. The soft tissues are grossly unremarkable. IMPRESSION: Persistent anterior dislocation of the left shoulder. Electronically Signed   By: Anner Crete M.D.   On: 01/18/2021 00:05        Scheduled Meds:  calcium-vitamin D  1 tablet Oral BID WC   Chlorhexidine Gluconate Cloth  6 each Topical Daily   [START ON 01/20/2021] enoxaparin (LOVENOX) injection  40 mg Subcutaneous [id number]   folic acid  1 mg Oral Daily   gabapentin  300 mg Oral BID   insulin aspart  0-9 Units Subcutaneous TID WC   levothyroxine  150 mcg Oral QAC breakfast   lidocaine  1  patch Transdermal Q24H   pantoprazole  40 mg Oral Daily   potassium chloride  40 mEq Oral Daily   pramipexole  1.5 mg Oral TID   QUEtiapine  25 mg Oral QHS   senna-docusate  1 tablet Oral BID   sodium chloride flush  10-40 mL Intracatheter Q12H   sodium chloride  1 g Oral BID WC   vitamin B-12  1,000 mcg Oral Daily   Continuous Infusions:  ceFEPime (MAXIPIME) IV 2 g (01/18/21 0925)     LOS: 6 days    Time spent: 14 min    Emeterio Reeve, MD [facility] Pager [phone]  If 7PM-7AM, please contact night-coverage [email or web address] Password [email or web address] 01/18/2021, 1:33 PM

## 2021-01-19 LAB — CBC
HCT: 23.9 % — ABNORMAL LOW (ref 36.0–46.0)
Hemoglobin: 7.5 g/dL — ABNORMAL LOW (ref 12.0–15.0)
MCH: 29.4 pg (ref 26.0–34.0)
MCHC: 31.4 g/dL (ref 30.0–36.0)
MCV: 93.7 fL (ref 80.0–100.0)
Platelets: 313 10*3/uL (ref 150–400)
RBC: 2.55 MIL/uL — ABNORMAL LOW (ref 3.87–5.11)
RDW: 18.3 % — ABNORMAL HIGH (ref 11.5–15.5)
WBC: 4.3 10*3/uL (ref 4.0–10.5)
nRBC: 0 % (ref 0.0–0.2)

## 2021-01-19 LAB — BASIC METABOLIC PANEL
Anion gap: 6 (ref 5–15)
BUN: 8 mg/dL (ref 8–23)
CO2: 22 mmol/L (ref 22–32)
Calcium: 6.9 mg/dL — ABNORMAL LOW (ref 8.9–10.3)
Chloride: 105 mmol/L (ref 98–111)
Creatinine, Ser: 0.6 mg/dL (ref 0.44–1.00)
GFR, Estimated: >60 mL/min (ref >60)
Glucose, Bld: 127 mg/dL — ABNORMAL HIGH (ref 70–99)
Potassium: 3.9 mmol/L (ref 3.5–5.1)
Sodium: 133 mmol/L — ABNORMAL LOW (ref 135–145)

## 2021-01-19 LAB — GLUCOSE, CAPILLARY
Glucose-Capillary: 91 mg/dL (ref 70–99)
Glucose-Capillary: 127 mg/dL — ABNORMAL HIGH (ref 70–99)
Glucose-Capillary: 148 mg/dL — ABNORMAL HIGH (ref 70–99)
Glucose-Capillary: 116 mg/dL — ABNORMAL HIGH (ref 70–99)

## 2021-01-19 LAB — TROPONIN I (HIGH SENSITIVITY): Troponin I (High Sensitivity): 109 ng/L (ref <18)

## 2021-01-19 LAB — MAGNESIUM: Magnesium: 1.6 mg/dL — ABNORMAL LOW (ref 1.7–2.4)

## 2021-01-19 MED ORDER — CEFAZOLIN SODIUM-DEXTROSE 2-4 GM/100ML-% IV SOLN
2.0000 g | Freq: Three times a day (TID) | INTRAVENOUS | Status: DC
  Administered 2021-01-20 – 2021-01-22 (×8): 2 g via INTRAVENOUS
  Filled 2021-01-19 (×10): qty 100

## 2021-01-19 NOTE — Plan of Care (Signed)
  Problem: Education: Goal: Knowledge of General Education information will improve Description: Including pain rating scale, medication(s)/side effects and non-pharmacologic comfort measures Outcome: Progressing   Problem: Pain Managment: Goal: General experience of comfort will improve Outcome: Not Progressing   

## 2021-01-19 NOTE — Plan of Care (Signed)
°  Problem: Elimination: Goal: Will not experience complications related to bowel motility Outcome: Progressing   Problem: Safety: Goal: Ability to remain free from injury will improve Outcome: Progressing   Problem: Skin Integrity: Goal: Risk for impaired skin integrity will decrease Outcome: Progressing   Problem: Activity: Goal: Risk for activity intolerance will decrease Outcome: Progressing   Problem: Nutrition: Goal: Adequate nutrition will be maintained Outcome: Progressing

## 2021-01-19 NOTE — Progress Notes (Signed)
Ok to resume cefazolin 2g IV q8 for epidural abscess in AM after cefepime is completed tonight per Dr Lyn Hollingshead. Stop date of 03/01/21.  Ulyses Southward, PharmD, BCIDP, AAHIVP, CPP Infectious Disease Pharmacist 01/19/2021 10:33 AM

## 2021-01-19 NOTE — Progress Notes (Signed)
PROGRESS NOTE    Jillian Leblanc  [id number] DOB: 18-Dec-1945 DOA: 01/12/2021 PCP: Guadlupe Spanish, MD     Brief Narrative:  75 year old with history of DM2, PAF , not on anticoagulation due to anemia per last discharge summary, sinus pause status post pacemaker, CHF with preserved EF, hypothyroidism admitted for altered mental status.  Patient was discharged 5 days prior to this admission, after being treated for E. coli bacteremia, lumbar osteomyelitis with epidural abscess underwent laminectomy with evacuation of abscess.  Discharged on IV Ancef until 03/01/2021 by ID.  Also had urinary retention therefore was discharged on Foley catheter.  At SNF patient became confused having auditory and visual hallucination therefore brought to the hospital and was found to have sepsis secondary to UTI/CAUTI and multiple electrolyte imbalance.  Assessment & Plan:   Principal Problem:   Sepsis ([place]) Active Problems:   Hyponatremia   Hypokalemia   Elevated troponin   Hypothyroidism   Acute metabolic encephalopathy   Chronic diastolic CHF (congestive heart failure) (HCC)   Hypocalcemia   Hypomagnesemia   Sepsis secondary to UTI ([place])   Acute urinary retention   Altered mental status   Dislocation closed   Disorientation   Left shoulder pain   Urinary tract infection without hematuria  Urosepsis secondary to indwelling catheter; CAUTI -Blood Cultures- No growth. Urine Cx positive for Pseudomonas, empiric antibiotics- Cefepime, plan for total 7 days treatment (last dose 01/21/21 -Foley exchanged on 12/21 in ED     Acute metabolic encephalopathy secondary to UTI w/indwelling catheter, possible delirium d/t pain from L anterior shoulder disclocation -Mentation was reportedly improved and was Aaox3 per note 01/17/21, however nursing reports sundowning behavior and daughter was concerned that patient was very confused yesterday 01/18/21 on my exam she was oriented to place and person but she was  mumbling and yelling over and over same asking for daddy, she thinks her daughter is her mother, etc. -Repeat labs and blood culture, TSH w/ reflex may have been slightly abnormal due to acute illness but was not dramatically abnormal, flat troponins and EKG ok. Held Seroquel and trazodone last night. Today MUCH BETTER mentation and I think if she maintains good pain control and no further delirium can hoepfully d/c tomorrow 12/28   L shoulder pain and arm edema d/t anterior shoulder dislocation -ortho recommends f/u outpatient slign prn for comfort.   Elevated troponin  -Demand ischemia, remained flat.   Hyponatremia -seen by nephrology during recent hospitalization, appear responded to iv lasix/salt tabs and iv albumin , fluids restriction -repeating labs    Hypokalemia/hypomagnesemia/hypocalcemia -continue to replace  History of recent E.coli bacteremia epidural abscess and laminectomy -Status post L4-L5 laminectomy with evacuation of epidural abscess on 12/12 with neurosurgery  - No active infection to healing surgical wound - Patient was placed on IV Ancef until 03/01/2021 by ID.Currently on IV cefepime for UTI, plan transition back to Ancef after treating UTI Picc line placed on 12/15   History of urinary retention -Foley catheter was placed prior to her discharge on 12/15.   -foley exchanged on 12/21  -Need outpatient urology follow-up    chronic diastolic heart failure Dehydrated, held lasix.  Likely will have intravascular depletion due to low albumin Close monitor volume status She was put on 2liter oxygen in the ED, cxr at the time no acute findings, will try to wean oxygen, may need lasix with albumin if not able to wean oxygen She denies sob, no chest pain She has been bedridden due to  back pain, will provide Incentive spirometer   Recently diagnosed PAF, was seen by cardiology last admission, anticoagulation issue to be addressed upon outpatient follow-up with  cardiology   Anemia of chronic disease -Baseline hemoglobin 8.0.  No obvious signs of bleeding. -FOBT ordered pending collection   Type 2 diabetes Last hemoglobin A1c of 8.4 in October -Sliding scale Accu-Cheks   Hypothyroidism Continue levothyroxine     DVT prophylaxis: lovenox Code Status:FULL Family Communication: spoke w/ daughter today Disposition Plan: home w/ HH     Antimicrobials Anti-infectives (From admission, onward)    Start     Dose/Rate Route Frequency Ordered Stop   01/20/21 0600  ceFAZolin (ANCEF) IVPB 2g/100 mL premix        2 g 200 mL/hr over 30 Minutes Intravenous Every 8 hours 01/19/21 1030 03/02/21 0559   01/14/21 0900  ceFEPIme (MAXIPIME) 2 g in sodium chloride 0.9 % 100 mL IVPB        2 g 200 mL/hr over 30 Minutes Intravenous Every 8 hours 01/14/21 0749 01/19/21 1731   01/13/21 2100  vancomycin (VANCOCIN) IVPB 1000 mg/200 mL premix  Status:  Discontinued        1,000 mg 200 mL/hr over 60 Minutes Intravenous Every 24 hours 01/12/21 2117 01/12/21 2313   01/13/21 0700  ceFEPIme (MAXIPIME) 2 g in sodium chloride 0.9 % 100 mL IVPB  Status:  Discontinued        2 g 200 mL/hr over 30 Minutes Intravenous Every 12 hours 01/12/21 2117 01/14/21 0749   01/12/21 2000  vancomycin (VANCOREADY) IVPB 1500 mg/300 mL        1,500 mg 150 mL/hr over 120 Minutes Intravenous  Once 01/12/21 1913 01/12/21 2316   01/12/21 1930  ceFEPIme (MAXIPIME) 2 g in sodium chloride 0.9 % 100 mL IVPB  Status:  Discontinued        2 g 200 mL/hr over 30 Minutes Intravenous Every 8 hours 01/12/21 1913 01/12/21 2117   01/12/21 1915  metroNIDAZOLE (FLAGYL) IVPB 500 mg        500 mg 100 mL/hr over 60 Minutes Intravenous  Once 01/12/21 1908 01/12/21 2050       Subjective: Seen today w/ daughter on the phone, pt is alert and talkative, oritneted to person, place, day, month, year and no complaints    Objective: Vitals:   01/18/21 2140 01/19/21 0546 01/19/21 0858 01/19/21 1625  BP:  (!) 157/76 124/69 98/69 (!) 166/85  Pulse: 91 98 95 98  Resp: 19 19 18 20   Temp: 98.4 F (36.9 C) 98.5 F (36.9 C) 99.1 F (37.3 C) 99.2 F (37.3 C)  TempSrc:  Oral Oral Oral  SpO2: 100% 100% 100% 99%  Weight:      Height:        Intake/Output Summary (Last 24 hours) at 01/19/2021 1812 Last data filed at 01/19/2021 1215 Gross per 24 hour  Intake 940 ml  Output 2100 ml  Net -1160 ml    Filed Weights   01/12/21 1838  Weight: 84.3 kg    Examination:  General exam: Appears agitated  Respiratory system: Clear to auscultation. Respiratory effort normal. Cardiovascular system: S1 & S2 heard, RRR. No pedal edema. (+)L UE edema Gastrointestinal system: Abdomen is nondistended, soft and nontender. No organomegaly or masses felt. Normal bowel sounds heard. Central nervous system: Alert. No focal neurological deficits. Extremities: Symmetric 5 x 5 power. Limited LUE movement d/t pain Skin: No rashes. Healing abrasion/cut on L forearm  Psychiatry: A&Ox3  Data Reviewed: I have personally reviewed following labs and imaging studies  CBC: Recent Labs  Lab 01/12/21 1907 01/14/21 0256 01/16/21 0302 01/17/21 0326 01/18/21 0408 01/18/21 2100 01/19/21 0211  WBC 7.6   < > 4.6 5.0 4.6 5.4 4.3  NEUTROABS 5.0  --   --   --   --  3.3  --   HGB 8.7*   < > 7.3* 7.5* 7.2* 7.6* 7.5*  HCT 26.9*   < > 23.2* 23.2* 22.9* 24.1* 23.9*  MCV 88.8   < > 92.4 92.1 94.2 92.3 93.7  PLT 208   < > 300 326 335 332 313   < > = values in this interval not displayed.    Basic Metabolic Panel: Recent Labs  Lab 01/13/21 0808 01/14/21 0256 01/15/21 0510 01/16/21 0302 01/17/21 0326 01/18/21 0408 01/18/21 2100 01/19/21 0211  NA 128* 129*   < > 131* 132* 133* 132* 133*  K 3.4* 4.1   < > 3.3* 3.7 4.3 4.0 3.9  CL 92* 97*   < > 101 104 105 105 105  CO2 25 24   < > 24 23 22  20* 22  GLUCOSE 129* 131*   < > 127* 130* 110* 136* 127*  BUN 11 10   < > 8 7* 8 8 8   CREATININE 0.73 0.68   < > 0.65 0.59  0.74 0.61 0.60  CALCIUM 6.5* 6.4*   < > 6.7* 6.7* 6.8* 6.9* 6.9*  MG  --  1.5*   < > 1.7 1.8 2.0 1.6* 1.6*  PHOS 3.2 5.0*  --   --   --   --   --   --    < > = values in this interval not displayed.    GFR: Estimated Creatinine Clearance: 60.3 mL/min (by C-G formula based on SCr of 0.6 mg/dL). Liver Function Tests: Recent Labs  Lab 01/12/21 1907 01/16/21 0302 01/18/21 2100  AST 23 19 28   ALT 6 5 7   ALKPHOS 189* 142* 183*  BILITOT 1.1 0.9 1.0  PROT 6.1* 5.4* 5.9*  ALBUMIN 2.1* 1.7* 1.7*    No results for input(s): LIPASE, AMYLASE in the last 168 hours. No results for input(s): AMMONIA in the last 168 hours. Coagulation Profile: Recent Labs  Lab 01/12/21 1907  INR 1.3*    Cardiac Enzymes: No results for input(s): CKTOTAL, CKMB, CKMBINDEX, TROPONINI in the last 168 hours. BNP (last 3 results) No results for input(s): PROBNP in the last 8760 hours. HbA1C: No results for input(s): HGBA1C in the last 72 hours. CBG: Recent Labs  Lab 01/18/21 1650 01/18/21 2140 01/19/21 0636 01/19/21 1113 01/19/21 1621  GLUCAP 171* 129* 91 127* 148*    Lipid Profile: No results for input(s): CHOL, HDL, LDLCALC, TRIG, CHOLHDL, LDLDIRECT in the last 72 hours. Thyroid Function Tests: Recent Labs    01/18/21 2100  TSH 1.796  FREET4 2.23*   Anemia Panel: No results for input(s): VITAMINB12, FOLATE, FERRITIN, TIBC, IRON, RETICCTPCT in the last 72 hours.  Urine analysis:    Component Value Date/Time   COLORURINE YELLOW 01/12/2021 1907   APPEARANCEUR HAZY (A) 01/12/2021 1907   LABSPEC 1.014 01/12/2021 1907   PHURINE 6.0 01/12/2021 1907   GLUCOSEU 50 (A) 01/12/2021 1907   HGBUR SMALL (A) 01/12/2021 1907   BILIRUBINUR NEGATIVE 01/12/2021 1907   KETONESUR NEGATIVE 01/12/2021 1907   PROTEINUR 30 (A) 01/12/2021 1907   UROBILINOGEN 0.2 09/15/2012 1536   NITRITE POSITIVE (A) 01/12/2021 1907   LEUKOCYTESUR LARGE (  A) 01/12/2021 1907   Sepsis  Labs: @LABRCNTIP (procalcitonin:4,lacticidven:4)  ) Recent Results (from the past 240 hour(s))  Urine Culture     Status: Abnormal   Collection Time: 01/12/21  1:00 PM   Specimen: In/Out Cath Urine  Result Value Ref Range Status   Specimen Description IN/OUT CATH URINE  Final   Special Requests   Final    NONE Performed at [facility], [address] [place], [place] [address]    Culture >=100,000 COLONIES/mL PSEUDOMONAS AERUGINOSA (A)  Final   Report Status 01/15/2021 FINAL  Final   Organism ID, Bacteria PSEUDOMONAS AERUGINOSA (A)  Final      Susceptibility   Pseudomonas aeruginosa - MIC*    CEFTAZIDIME 2 SENSITIVE Sensitive     CIPROFLOXACIN <=0.25 SENSITIVE Sensitive     GENTAMICIN <=1 SENSITIVE Sensitive     IMIPENEM 2 SENSITIVE Sensitive     PIP/TAZO <=4 SENSITIVE Sensitive     CEFEPIME 2 SENSITIVE Sensitive     * >=100,000 COLONIES/mL PSEUDOMONAS AERUGINOSA  Resp Panel by RT-PCR (Flu A&B, Covid)     Status: None   Collection Time: 01/12/21  7:07 PM   Specimen: Nasopharyngeal(NP) swabs in vial transport medium  Result Value Ref Range Status   SARS Coronavirus 2 by RT PCR NEGATIVE NEGATIVE Final    Comment: (NOTE) SARS-CoV-2 target nucleic acids are NOT DETECTED.  The SARS-CoV-2 RNA is generally detectable in upper respiratory specimens during the acute phase of infection. The lowest concentration of SARS-CoV-2 viral copies this assay can detect is 138 copies/mL. A negative result does not preclude SARS-Cov-2 infection and should not be used as the sole basis for treatment or other patient management decisions. A negative result may occur with  improper specimen collection/handling, submission of specimen other than nasopharyngeal swab, presence of viral mutation(s) within the areas targeted by this assay, and inadequate number of viral copies(<138 copies/mL). A negative result must be combined with clinical observations, patient history, and  epidemiological information. The expected result is Negative.  Fact Sheet for Patients:  EntrepreneurPulse.com.au  Fact Sheet for Healthcare Providers:  IncredibleEmployment.be  This test is no t yet approved or cleared by the [place] FDA and  has been authorized for detection and/or diagnosis of SARS-CoV-2 by FDA under an Emergency Use Authorization (EUA). This EUA will remain  in effect (meaning this test can be used) for the duration of the COVID-19 declaration under Section 564(b)(1) of the Act, 21 U.S.C.section 360bbb-3(b)(1), unless the authorization is terminated  or revoked sooner.       Influenza A by PCR NEGATIVE NEGATIVE Final   Influenza B by PCR NEGATIVE NEGATIVE Final    Comment: (NOTE) The Xpert Xpress SARS-CoV-2/FLU/RSV plus assay is intended as an aid in the diagnosis of influenza from Nasopharyngeal swab specimens and should not be used as a sole basis for treatment. Nasal washings and aspirates are unacceptable for Xpert Xpress SARS-CoV-2/FLU/RSV testing.  Fact Sheet for Patients: EntrepreneurPulse.com.au  Fact Sheet for Healthcare Providers: IncredibleEmployment.be  This test is not yet approved or cleared by the [place] FDA and has been authorized for detection and/or diagnosis of SARS-CoV-2 by FDA under an Emergency Use Authorization (EUA). This EUA will remain in effect (meaning this test can be used) for the duration of the COVID-19 declaration under Section 564(b)(1) of the Act, 21 U.S.C. section 360bbb-3(b)(1), unless the authorization is terminated or revoked.  Performed at [facility], [place] [address], [place], [place] [address]  Blood Culture (routine x 2)     Status: None   Collection Time: 01/12/21  7:07 PM   Specimen: BLOOD  Result Value Ref Range Status   Specimen Description BLOOD LEFT ANTECUBITAL  Final   Special Requests   Final    BOTTLES  DRAWN AEROBIC AND ANAEROBIC Blood Culture adequate volume   Culture   Final    NO GROWTH 5 DAYS Performed at [facility], [address] [place], [place] [address]    Report Status 01/17/2021 FINAL  Final  Blood Culture (routine x 2)     Status: None   Collection Time: 01/13/21  8:08 AM   Specimen: BLOOD  Result Value Ref Range Status   Specimen Description BLOOD LEFT ANTECUBITAL  Final   Special Requests   Final    BOTTLES DRAWN AEROBIC AND ANAEROBIC Blood Culture adequate volume   Culture   Final    NO GROWTH 5 DAYS Performed at [facility], [place] [address] [place], [place] [address]    Report Status 01/18/2021 FINAL  Final         Radiology Studies: DG Shoulder Left Port  Result Date: 01/18/2021 CLINICAL DATA:  Left shoulder dislocation with EXAM: LEFT SHOULDER COMPARISON:  Earlier radiograph dated 01/17/2021. FINDINGS: Persistent anterior dislocation of the left shoulder. No obvious acute fracture. The bones are osteopenic. Left pectoral pacemaker device. The soft tissues are grossly unremarkable. IMPRESSION: Persistent anterior dislocation of the left shoulder. Electronically Signed   By: Anner Crete M.D.   On: 01/18/2021 00:05   VAS [place] UPPER EXTREMITY VENOUS DUPLEX  Result Date: 01/18/2021 UPPER VENOUS STUDY  Patient Name:  EMERLY VIERNES  Date of Exam:   01/18/2021 Medical Rec #: [id number]       Accession #:    [id number] Date of Birth: 09/21/1945       Patient Gender: F Patient Age:   48 years Exam Location:  [facility] Procedure:      VAS [place] UPPER EXTREMITY VENOUS DUPLEX Referring Phys: Emeterio Reeve --------------------------------------------------------------------------------  Indications: Swelling, and Pain Comparison Study: prior 01/13/21 Performing Technologist: Archie Patten RVS  Examination Guidelines: A complete evaluation includes B-mode imaging, spectral Doppler, color Doppler, and power Doppler as needed of all accessible  portions of each vessel. Bilateral testing is considered an integral part of a complete examination. Limited examinations for reoccurring indications may be performed as noted.  Right Findings: +----------+------------+---------+-----------+----------+-------+  RIGHT      Compressible Phasicity Spontaneous Properties Summary  +----------+------------+---------+-----------+----------+-------+  Subclavian     Full        Yes        Yes                         +----------+------------+---------+-----------+----------+-------+  Left Findings: +----------+------------+---------+-----------+----------+-------+  LEFT       Compressible Phasicity Spontaneous Properties Summary  +----------+------------+---------+-----------+----------+-------+  IJV            Full        Yes        Yes                         +----------+------------+---------+-----------+----------+-------+  Subclavian     Full        Yes        Yes                         +----------+------------+---------+-----------+----------+-------+  Axillary       Full        Yes        Yes                         +----------+------------+---------+-----------+----------+-------+  Brachial       Full        Yes        Yes                         +----------+------------+---------+-----------+----------+-------+  Radial         Full                                               +----------+------------+---------+-----------+----------+-------+  Ulnar          Full                                               +----------+------------+---------+-----------+----------+-------+  Cephalic       Full                                               +----------+------------+---------+-----------+----------+-------+  Basilic        Full                                               +----------+------------+---------+-----------+----------+-------+  Summary:  Right: No evidence of thrombosis in the subclavian.  Left: No evidence of deep vein thrombosis in the upper extremity.  No evidence of superficial vein thrombosis in the upper extremity.  *See table(s) above for measurements and observations.    Preliminary         Scheduled Meds:  calcium-vitamin D  1 tablet Oral BID WC   Chlorhexidine Gluconate Cloth  6 each Topical Daily   [START ON 01/20/2021] enoxaparin (LOVENOX) injection  40 mg Subcutaneous Q24H   folic acid  1 mg Oral Daily   gabapentin  300 mg Oral BID   insulin aspart  0-9 Units Subcutaneous TID WC   levothyroxine  150 mcg Oral QAC breakfast   lidocaine  1 patch Transdermal Q24H   pantoprazole  40 mg Oral Daily   potassium chloride  40 mEq Oral Daily   pramipexole  1.5 mg Oral TID   QUEtiapine  25 mg Oral QHS   senna-docusate  1 tablet Oral BID   sodium chloride flush  10-40 mL Intracatheter Q12H   sodium chloride  1 g Oral BID WC   vitamin B-12  1,000 mcg Oral Daily   Continuous Infusions:  [START ON 01/20/2021]  ceFAZolin (ANCEF) IV       LOS: 7 days    Time spent: 55 min    Sunnie Nielsen, MD [facility]   If 7PM-7AM, please contact night-coverage [email or web address] Password [facility] 01/19/2021, 6:12 PM

## 2021-01-19 NOTE — Progress Notes (Addendum)
Physical Therapy Treatment Patient Details Name: Jillian Leblanc MRN: [id number] DOB: 1945-04-07 Today's Date: 01/19/2021   History of Present Illness Pt is a 75 y/o female admitted 12/20 from SNF for recent septic shock from [facility] bacteremia L4-S1 osteo with epidural abscess, s/p L45 lami and IV antibx.  Pt now admitted with AMS found to be due to UTI/[place]i. Ortho consulted for left shoulder dislocation noted during admission. 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 Not a candidate for surgery. PMHx:  DM2, HFpEF    PT Comments    Pt not progressing towards her physical therapy goals. Requiring two person maximal assist to sit up on the side of the bed. Donned left shoulder sling; (per ortho, "sling for comfort when out of bed"), however, pt with seemingly uncontrolled pain, and seemed to be responding to internal auditory stimuli. She is still unable to stand. Returned to supine and repositioned with hot pack. Requiring min assist for self feeding. Agree with recommendation for hoyer lift out of bed.    Recommendations for follow up therapy are one component of a multi-disciplinary discharge planning process, led by the attending physician.  Recommendations may be updated based on patient status, additional functional criteria and insurance authorization.  Follow Up Recommendations  Skilled nursing-short term rehab (<3 hours/day)     Assistance Recommended at Discharge Frequent or [facility] bed;Other (comment) (hoyer lift. Pt owns a wheelchair)    Recommendations for Other Services       Precautions / Restrictions Precautions Precautions: Fall;Back Precaution Booklet Issued: No Precaution Comments: Back for comfort Required Braces or Orthoses: Spinal Brace Spinal Brace: Thoracolumbosacral  orthotic Restrictions Weight Bearing Restrictions: No RLE Weight Bearing: Weight bearing as tolerated LLE Weight Bearing: Weight bearing as tolerated Other Position/Activity Restrictions: "Sling for comfort when out of bed, ok to use LUE within pain tolerance."     Mobility  Bed Mobility Overal bed mobility: Needs Assistance Bed Mobility: Supine to Sit;Sit to Supine     Supine to sit: Max assist;+2 for physical assistance Sit to supine: Max assist;+2 for physical assistance   General bed mobility comments: max multimodal cues for reaching with RUE for railing, assist for BLE's and trunk    Transfers                   General transfer comment: unable    Ambulation/Gait                   Stairs             Wheelchair Mobility    Modified Rankin (Stroke Patients Only)       Balance Overall balance assessment: Needs assistance Sitting-balance support: Single extremity supported;Feet supported Sitting balance-Leahy Scale: Poor Sitting balance - Comments: reliant on UE support of bed                                    Cognition Arousal/Alertness: Awake/alert Behavior During Therapy: Anxious Overall Cognitive Status: Impaired/Different from baseline Area of Impairment: Memory;Attention;Awareness;Problem solving;Following commands                   Current Attention Level: Sustained;Focused (internally distracted by pain) Memory: Decreased short-term memory;Decreased recall of precautions Following Commands: Follows one step commands inconsistently Safety/Judgement: Decreased awareness of  safety;Decreased awareness of deficits Awareness: Intellectual Problem Solving: Requires verbal cues;Requires tactile cues;Slow processing;Decreased initiation;Difficulty sequencing General Comments: Pt seems to be responding to internal auditory stimuli        Exercises      General Comments        Pertinent Vitals/Pain Pain  Assessment: Faces Faces Pain Scale: Hurts worst Pain Location: L shoulder, back Pain Descriptors / Indicators: Grimacing;Guarding;Sharp Pain Intervention(s): Limited activity within patient's tolerance;Monitored during session;Repositioned;Heat applied;Other (comment) (RN notified)    Home Living                          Prior Function            PT Goals (current goals can now be found in the care plan section) Acute Rehab PT Goals Patient Stated Goal: less pain Potential to Achieve Goals: Fair Progress towards PT goals: Progressing toward goals    Frequency    Min 2X/week      PT Plan Frequency needs to be updated    Co-evaluation              AM-PAC PT "6 Clicks" Mobility   Outcome Measure  Help needed turning from your back to your side while in a flat bed without using bedrails?: Total Help needed moving from lying on your back to sitting on the side of a flat bed without using bedrails?: Total Help needed moving to and from a bed to a chair (including a wheelchair)?: Total Help needed standing up from a chair using your arms (e.g., wheelchair or bedside chair)?: Total Help needed to walk in hospital room?: Total Help needed climbing 3-5 steps with a railing? : Total 6 Click Score: 6    End of Session Equipment Utilized During Treatment: Other (comment) (shoulder sling) Activity Tolerance: Patient limited by pain Patient left: in bed;with call bell/phone within reach;with bed alarm set;with family/visitor present Nurse Communication: Mobility status;Other (comment) (pain meds) PT Visit Diagnosis: Other abnormalities of gait and mobility (R26.89);Muscle weakness (generalized) (M62.81) Pain - Right/Left: Left Pain - part of body: Hip     Time: 4818-5631 PT Time Calculation (min) (ACUTE ONLY): 28 min  Charges:  $Therapeutic Activity: 23-37 mins                     [name], PT, DPT Acute Rehabilitation Services Pager  [phone] Office [phone]    [name] 01/19/2021, 3:34 PM

## 2021-01-19 NOTE — Progress Notes (Signed)
Orthopedic Tech Progress Note Patient Details:  Jillian Leblanc [birth year] [id number]  THERAPY was getting ready to work with patient and they said they would apply and teach patient about the shoulder sling   Ortho Devices Type of Ortho Device: Shoulder immobilizer Ortho Device/Splint Location: LUE Ortho Device/Splint Interventions: Ordered   Post Interventions Patient Tolerated: Other (comment) Instructions Provided: Care of device  Donald Pore 01/19/2021, 11:45 AM

## 2021-01-20 ENCOUNTER — Ambulatory Visit: Payer: Medicare Other | Admitting: Internal Medicine

## 2021-01-20 LAB — CBC
HCT: 24.2 % — ABNORMAL LOW (ref 36.0–46.0)
Hemoglobin: 7.8 g/dL — ABNORMAL LOW (ref 12.0–15.0)
MCH: 29.8 pg (ref 26.0–34.0)
MCHC: 32.2 g/dL (ref 30.0–36.0)
MCV: 92.4 fL (ref 80.0–100.0)
Platelets: 298 10*3/uL (ref 150–400)
RBC: 2.62 MIL/uL — ABNORMAL LOW (ref 3.87–5.11)
RDW: 18.1 % — ABNORMAL HIGH (ref 11.5–15.5)
WBC: 4.6 10*3/uL (ref 4.0–10.5)
nRBC: 0 % (ref 0.0–0.2)

## 2021-01-20 LAB — GLUCOSE, CAPILLARY
Glucose-Capillary: 106 mg/dL — ABNORMAL HIGH (ref 70–99)
Glucose-Capillary: 133 mg/dL — ABNORMAL HIGH (ref 70–99)
Glucose-Capillary: 210 mg/dL — ABNORMAL HIGH (ref 70–99)
Glucose-Capillary: 175 mg/dL — ABNORMAL HIGH (ref 70–99)

## 2021-01-20 LAB — BASIC METABOLIC PANEL
Anion gap: 7 (ref 5–15)
BUN: 8 mg/dL (ref 8–23)
CO2: 21 mmol/L — ABNORMAL LOW (ref 22–32)
Calcium: 7 mg/dL — ABNORMAL LOW (ref 8.9–10.3)
Chloride: 104 mmol/L (ref 98–111)
Creatinine, Ser: 0.61 mg/dL (ref 0.44–1.00)
GFR, Estimated: >60 mL/min (ref >60)
Glucose, Bld: 120 mg/dL — ABNORMAL HIGH (ref 70–99)
Potassium: 3.6 mmol/L (ref 3.5–5.1)
Sodium: 132 mmol/L — ABNORMAL LOW (ref 135–145)

## 2021-01-20 LAB — MAGNESIUM: Magnesium: 1.6 mg/dL — ABNORMAL LOW (ref 1.7–2.4)

## 2021-01-20 LAB — T3, FREE: T3, Free: 1.3 pg/mL — ABNORMAL LOW (ref 2.0–4.4)

## 2021-01-20 MED ORDER — MAGNESIUM OXIDE -MG SUPPLEMENT 400 (240 MG) MG PO TABS
400.0000 mg | ORAL_TABLET | Freq: Two times a day (BID) | ORAL | Status: DC
  Administered 2021-01-20 – 2021-01-22 (×4): 400 mg via ORAL
  Filled 2021-01-20 (×4): qty 1

## 2021-01-20 MED ORDER — MAGNESIUM OXIDE -MG SUPPLEMENT 400 (240 MG) MG PO TABS: 200.0000 mg | ORAL_TABLET | Freq: Two times a day (BID) | ORAL | Status: DC

## 2021-01-20 MED ORDER — MAGNESIUM SULFATE 2 GM/50ML IV SOLN
2.0000 g | Freq: Once | INTRAVENOUS | Status: AC
  Administered 2021-01-20: 2 g via INTRAVENOUS
  Filled 2021-01-20: qty 50

## 2021-01-20 NOTE — Progress Notes (Signed)
Physical Therapy Treatment Patient Details Name: Jillian Leblanc MRN: [id number] DOB: 08/17/1945 Today's Date: 01/20/2021   History of Present Illness Pt is a 75 y/o female admitted 12/20 from SNF for recent septic shock from [facility] bacteremia L4-S1 osteo with epidural abscess, s/p L45 lami and IV antibx.  Pt now admitted with AMS found to be due to UTI/Cauti. Ortho consulted for left shoulder dislocation noted during admission. 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 Not a candidate for surgery. PMHx:  DM2, HFpEF    PT Comments    PT passing room, observed pt sideways in bed, seemingly struggling with mobility. Pt attempting to sit up to eat lunch. Pt currently requires physical assistance to perform all bed mobility, with difficulty motor planning to effectively sequence rolling. Pt is profoundly weak and requires significant assistance for all bed mobility. Pt will benefit from continued PT services to reduce caregiver burden and risk of patient and caregiver injury. Pt continues to express a desire to return home. If discharging home then PT recommends a hoyer lift and hospital bed, with use of lift for all transfers. Pt will benefit from frequent supervision due to confusion and significant mobility deficits.  Recommendations for follow up therapy are one component of a multi-disciplinary discharge planning process, led by the attending physician.  Recommendations may be updated based on patient status, additional functional criteria and insurance authorization.  Follow Up Recommendations  Skilled nursing-short term rehab (<3 hours/day)     Assistance Recommended at Discharge Frequent or constant Supervision/Assistance  Equipment Recommendations  Hospital bed;Other (comment) (hoyer lift, pt owns a wheelchair)    Recommendations for Other Services       Precautions /  Restrictions Precautions Precautions: Fall;Back Precaution Booklet Issued: No Precaution Comments: Back for comfort Required Braces or Orthoses: Spinal Brace Spinal Brace: Thoracolumbosacral orthotic Restrictions Weight Bearing Restrictions: No     Mobility  Bed Mobility Overal bed mobility: Needs Assistance Bed Mobility: Rolling Rolling: Mod assist         General bed mobility comments: pt rolls from side to side to assist with hygiene tasks. Pt requires totalA to slide upward toward head of bed, and maxA to reposition laterally in bed    Transfers Overall transfer level:  (deferred as pt preferring to eat lunch upon PT arrival)                      Ambulation/Gait                   Stairs             Wheelchair Mobility    Modified Rankin (Stroke Patients Only)       Balance                                            Cognition Arousal/Alertness: Awake/alert Behavior During Therapy: Restless Overall Cognitive Status: Impaired/Different from baseline Area of Impairment: Memory;Following commands;Safety/judgement;Awareness;Problem solving                     Memory: Decreased short-term memory Following Commands: Follows one step commands with increased time;Follows multi-step commands inconsistently Safety/Judgement: Decreased awareness of safety;Decreased awareness of deficits Awareness: Intellectual Problem Solving: Slow processing;Decreased initiation;Difficulty sequencing;Requires verbal cues;Requires tactile  cues          Exercises      General Comments General comments (skin integrity, edema, etc.): pt on RA, anxious and restless, cognition appears worsened compared to previous session with this therapist      Pertinent Vitals/Pain Pain Assessment: Faces Faces Pain Scale: Hurts even more Pain Location: back Pain Descriptors / Indicators: Grimacing Pain Intervention(s): Monitored during session     Home Living                          Prior Function            PT Goals (current goals can now be found in the care plan section) Acute Rehab PT Goals Patient Stated Goal: less pain Progress towards PT goals: Not progressing toward goals - comment    Frequency    Min 2X/week      PT Plan Current plan remains appropriate    Co-evaluation              AM-PAC PT "6 Clicks" Mobility   Outcome Measure  Help needed turning from your back to your side while in a flat bed without using bedrails?: A Lot Help needed moving from lying on your back to sitting on the side of a flat bed without using bedrails?: Total Help needed moving to and from a bed to a chair (including a wheelchair)?: Total Help needed standing up from a chair using your arms (e.g., wheelchair or bedside chair)?: Total Help needed to walk in hospital room?: Total Help needed climbing 3-5 steps with a railing? : Total 6 Click Score: 7    End of Session   Activity Tolerance: Patient limited by fatigue Patient left: in bed;with call bell/phone within reach;with bed alarm set Nurse Communication: Mobility status;Need for lift equipment PT Visit Diagnosis: Other abnormalities of gait and mobility (R26.89);Muscle weakness (generalized) (M62.81) Pain - Right/Left: Left Pain - part of body: Hip     Time: [id number] PT Time Calculation (min) (ACUTE ONLY): 12 min  Charges:  $Therapeutic Activity: 8-22 mins                     Zenaida Niece, PT, DPT Acute Rehabilitation Pager: [phone] Office [phone]    Zenaida Niece 01/20/2021, 1:26 PM

## 2021-01-20 NOTE — Progress Notes (Signed)
PROGRESS NOTE    Jillian Leblanc  [id number] DOB: 1945-04-25 DOA: 01/12/2021 PCP: Guadlupe Spanish, MD   Brief Narrative: 75 year old with past medical history significant for diabetes type 2, PAF, not on anticoagulation due to anemia per last discharge summary, sinus pauses status post pacemaker, CHF with preserved ejection fraction, hypothyroidism admitted with altered mental status.  Patient was discharged 5 days prior to this admission after being treated for E. coli bacteremia, lumbar osteomyelitis with epidural abscess underwent laminectomy with evacuation of abscess.  Discharged on IV Ancef until 03/01/2021 by ID.  She also had urinary retention, she was discharged with a Foley catheter.  At the skilled nursing facility patient became more confused, having auditory and visual hallucination.  She was found to have sepsis secondary to UTI/gouty and multiple electrolytes imbalance.    Assessment & Plan:   Principal Problem:   Sepsis ([place]) Active Problems:   Hyponatremia   Hypokalemia   Elevated troponin   Hypothyroidism   Acute metabolic encephalopathy   Chronic diastolic CHF (congestive heart failure) (HCC)   Hypocalcemia   Hypomagnesemia   Sepsis secondary to UTI ([place])   Acute urinary retention   Altered mental status   Dislocation closed   Disorientation   Left shoulder pain   Urinary tract infection without hematuria  1-Sepsis  secondary to indwelling foley catheter, CAUTI:  -Blood cultures: No growth. -Culture results Pseudomonas. -Patient completed 7 days of IV antibiotics cefepime. -Foley exchanged on [id number]  2-Acute Metabolic Encephalopathy secondary to urinary tract infection, delirium -Patient mental status fluctuates. -Treating infectious process -Confusion appears to slowly improve -Continue to have memory issues  3-Left shoulder pain and arm edema due to anterior shoulder dislocation -Ortho recommended follow-up outpatient -Sling needed for  comfort.  4-Elevation troponin:  Demand ischemia, remained flat   5-Hyponatremia:  Stable. Monitor.   Hypokalemia, hypomagnesemia;  Replete IV mg Oral potassium/  History of recent E coli Bacteremia, Epidural Abscess and Laminectomy:  -S/P L4- L5 laminectomy with evacuation of epidural abscess on 12/12  by neurosurgery -Continue with IV Ancef until 03/01/2021 as recommended by ID.  History of urinary retention: Continue with chronic Foley catheter placed prior admission 12/15 Foley Exchanged 12/21 Needs outpatient follow-up with urology  Chronic diastolic heart failure Holding Lasix due to dehydration Monitor   volume status  Recent diagnosis of PAF: Seen by cardiology last admission anticoagulation issues to be addressed as an outpatient  Anemia of chronic disease. Monitor hemoglobin  Type 2 diabetes: Blood globin A1c 8.4 in October Continue with sliding scale insulin  Hypothyroidism: Continue with levothyroxine.     Pressure Injury 12/19/20 Sacrum Medial Stage 2 -  Partial thickness loss of dermis presenting as a shallow open injury with a red, pink wound bed without slough. stage 2  sacrum  4cm x .5cm (Active)  12/19/20 1640  Location: Sacrum  Location Orientation: Medial  Staging: Stage 2 -  Partial thickness loss of dermis presenting as a shallow open injury with a red, pink wound bed without slough.  Wound Description (Comments): stage 2  sacrum  4cm x .5cm  Present on Admission: Yes    Estimated body mass index is 34.92 kg/m as calculated from the following:   Height as of this encounter: 5' (1.524 m).   Weight as of this encounter: 81.1 kg.   DVT prophylaxis: Lovenox Code Status: Full Code Family Communication: Disposition Plan:  Status is: Inpatient  Remains inpatient appropriate because: -Awaiting disposition SNF>  Consultants:  None  Procedures:  None  Antimicrobials:  Ancef.  Completed Cefepime.    Subjective: She is  alert times 3, she was able to tell me she is at hospital. She has some memory problems.  She relates she is going home.   Objective: Vitals:   01/19/21 1625 01/19/21 2115 01/20/21 0649 01/20/21 0914  BP: (!) 166/85 138/78 (!) 163/70 (!) 156/60  Pulse: 98 98 90 92  Resp: 20 18 18 20   Temp: 99.2 F (37.3 C) 98.2 F (36.8 C) 98 F (36.7 C) 98.5 F (36.9 C)  TempSrc: Oral Oral Oral Oral  SpO2: 99% 100% 98% 100%  Weight:  81.1 kg    Height:        Intake/Output Summary (Last 24 hours) at 01/20/2021 1314 Last data filed at 01/20/2021 0900 Gross per 24 hour  Intake 480 ml  Output 2101 ml  Net -1621 ml   Filed Weights   01/12/21 1838 01/19/21 2115  Weight: 84.3 kg 81.1 kg    Examination:  General exam: Appears calm and comfortable  Respiratory system: Clear to auscultation. Respiratory effort normal. Cardiovascular system: S1 & S2 heard, RRR. No JVD, murmurs, rubs, gallops or clicks. No pedal edema. Gastrointestinal system: Abdomen is nondistended, soft and nontender. No organomegaly or masses felt. Normal bowel sounds heard. Central nervous system: Alert and oriented.  Extremities: no edema    Data Reviewed: I have personally reviewed following labs and imaging studies  CBC: Recent Labs  Lab 01/17/21 0326 01/18/21 0408 01/18/21 2100 01/19/21 0211 01/20/21 0325  WBC 5.0 4.6 5.4 4.3 4.6  NEUTROABS  --   --  3.3  --   --   HGB 7.5* 7.2* 7.6* 7.5* 7.8*  HCT 23.2* 22.9* 24.1* 23.9* 24.2*  MCV 92.1 94.2 92.3 93.7 92.4  PLT 326 335 332 313 298   Basic Metabolic Panel: Recent Labs  Lab 01/14/21 0256 01/15/21 0510 01/17/21 0326 01/18/21 0408 01/18/21 2100 01/19/21 0211 01/20/21 0325  NA 129*   < > 132* 133* 132* 133* 132*  K 4.1   < > 3.7 4.3 4.0 3.9 3.6  CL 97*   < > 104 105 105 105 104  CO2 24   < > 23 22 20* 22 21*  GLUCOSE 131*   < > 130* 110* 136* 127* 120*  BUN 10   < > 7* 8 8 8 8   CREATININE 0.68   < > 0.59 0.74 0.61 0.60 0.61  CALCIUM 6.4*   < >  6.7* 6.8* 6.9* 6.9* 7.0*  MG 1.5*   < > 1.8 2.0 1.6* 1.6* 1.6*  PHOS 5.0*  --   --   --   --   --   --    < > = values in this interval not displayed.   GFR: Estimated Creatinine Clearance: 59 mL/min (by C-G formula based on SCr of 0.61 mg/dL). Liver Function Tests: Recent Labs  Lab 01/16/21 0302 01/18/21 2100  AST 19 28  ALT 5 7  ALKPHOS 142* 183*  BILITOT 0.9 1.0  PROT 5.4* 5.9*  ALBUMIN 1.7* 1.7*   No results for input(s): LIPASE, AMYLASE in the last 168 hours. No results for input(s): AMMONIA in the last 168 hours. Coagulation Profile: No results for input(s): INR, PROTIME in the last 168 hours. Cardiac Enzymes: No results for input(s): CKTOTAL, CKMB, CKMBINDEX, TROPONINI in the last 168 hours. BNP (last 3 results) No results for input(s): PROBNP in the last 8760 hours. HbA1C: No  results for input(s): HGBA1C in the last 72 hours. CBG: Recent Labs  Lab 01/19/21 1113 01/19/21 1621 01/19/21 2112 01/20/21 0649 01/20/21 1122  GLUCAP 127* 148* 116* 106* 133*   Lipid Profile: No results for input(s): CHOL, HDL, LDLCALC, TRIG, CHOLHDL, LDLDIRECT in the last 72 hours. Thyroid Function Tests: Recent Labs    01/18/21 2100 01/19/21 0211  TSH 1.796  --   FREET4 2.23*  --   T3FREE  --  1.3*   Anemia Panel: No results for input(s): VITAMINB12, FOLATE, FERRITIN, TIBC, IRON, RETICCTPCT in the last 72 hours. Sepsis Labs: No results for input(s): PROCALCITON, LATICACIDVEN in the last 168 hours.  Recent Results (from the past 240 hour(s))  Urine Culture     Status: Abnormal   Collection Time: 01/12/21  1:00 PM   Specimen: In/Out Cath Urine  Result Value Ref Range Status   Specimen Description IN/OUT CATH URINE  Final   Special Requests   Final    NONE Performed at [facility], [address], [place], [place] [address]    Culture >=100,000 COLONIES/mL PSEUDOMONAS AERUGINOSA (A)  Final   Report Status 01/15/2021 FINAL  Final   Organism ID, Bacteria PSEUDOMONAS  AERUGINOSA (A)  Final      Susceptibility   Pseudomonas aeruginosa - MIC*    CEFTAZIDIME 2 SENSITIVE Sensitive     CIPROFLOXACIN <=0.25 SENSITIVE Sensitive     GENTAMICIN <=1 SENSITIVE Sensitive     IMIPENEM 2 SENSITIVE Sensitive     PIP/TAZO <=4 SENSITIVE Sensitive     CEFEPIME 2 SENSITIVE Sensitive     * >=100,000 COLONIES/mL PSEUDOMONAS AERUGINOSA  Resp Panel by RT-PCR (Flu A&B, Covid)     Status: None   Collection Time: 01/12/21  7:07 PM   Specimen: Nasopharyngeal(NP) swabs in vial transport medium  Result Value Ref Range Status   SARS Coronavirus 2 by RT PCR NEGATIVE NEGATIVE Final    Comment: (NOTE) SARS-CoV-2 target nucleic acids are NOT DETECTED.  The SARS-CoV-2 RNA is generally detectable in upper respiratory specimens during the acute phase of infection. The lowest concentration of SARS-CoV-2 viral copies this assay can detect is 138 copies/mL. A negative result does not preclude SARS-Cov-2 infection and should not be used as the sole basis for treatment or other patient management decisions. A negative result may occur with  improper specimen collection/handling, submission of specimen other than nasopharyngeal swab, presence of viral mutation(s) within the areas targeted by this assay, and inadequate number of viral copies(<138 copies/mL). A negative result must be combined with clinical observations, patient history, and epidemiological information. The expected result is Negative.  Fact Sheet for Patients:  EntrepreneurPulse.com.au  Fact Sheet for Healthcare Providers:  IncredibleEmployment.be  This test is no t yet approved or cleared by the [place] FDA and  has been authorized for detection and/or diagnosis of SARS-CoV-2 by FDA under an Emergency Use Authorization (EUA). This EUA will remain  in effect (meaning this test can be used) for the duration of the COVID-19 declaration under Section 564(b)(1) of the Act,  21 U.S.C.section 360bbb-3(b)(1), unless the authorization is terminated  or revoked sooner.       Influenza A by PCR NEGATIVE NEGATIVE Final   Influenza B by PCR NEGATIVE NEGATIVE Final    Comment: (NOTE) The Xpert Xpress SARS-CoV-2/FLU/RSV plus assay is intended as an aid in the diagnosis of influenza from Nasopharyngeal swab specimens and should not be used as a sole basis for treatment. Nasal washings and aspirates are  unacceptable for Xpert Xpress SARS-CoV-2/FLU/RSV testing.  Fact Sheet for Patients: EntrepreneurPulse.com.au  Fact Sheet for Healthcare Providers: IncredibleEmployment.be  This test is not yet approved or cleared by the [place] FDA and has been authorized for detection and/or diagnosis of SARS-CoV-2 by FDA under an Emergency Use Authorization (EUA). This EUA will remain in effect (meaning this test can be used) for the duration of the COVID-19 declaration under Section 564(b)(1) of the Act, 21 U.S.C. section 360bbb-3(b)(1), unless the authorization is terminated or revoked.  Performed at [facility], [place] [address], [place], [place] [address]   Blood Culture (routine x 2)     Status: None   Collection Time: 01/12/21  7:07 PM   Specimen: BLOOD  Result Value Ref Range Status   Specimen Description BLOOD LEFT ANTECUBITAL  Final   Special Requests   Final    BOTTLES DRAWN AEROBIC AND ANAEROBIC Blood Culture adequate volume   Culture   Final    NO GROWTH 5 DAYS Performed at [facility], [place] [address], [place], [place] [address]    Report Status 01/17/2021 FINAL  Final  Blood Culture (routine x 2)     Status: None   Collection Time: 01/13/21  8:08 AM   Specimen: BLOOD  Result Value Ref Range Status   Specimen Description BLOOD LEFT ANTECUBITAL  Final   Special Requests   Final    BOTTLES DRAWN AEROBIC AND ANAEROBIC Blood Culture adequate volume   Culture   Final    NO GROWTH 5 DAYS Performed at  [facility], [place] [address] [place], [place] [address]    Report Status 01/18/2021 FINAL  Final         Radiology Studies: VAS [place] UPPER EXTREMITY VENOUS DUPLEX  Result Date: 01/19/2021 UPPER VENOUS STUDY  Patient Name:  CAIRI SOBOTKA  Date of Exam:   01/18/2021 Medical Rec #: [id number]       Accession #:    [id number] Date of Birth: 11/29/1945       Patient Gender: F Patient Age:   89 years Exam Location:  [facility] Procedure:      VAS [place] UPPER EXTREMITY VENOUS DUPLEX Referring Phys: Emeterio Reeve --------------------------------------------------------------------------------  Indications: Swelling, and Pain Comparison Study: prior 01/13/21 Performing Technologist: Archie Patten RVS  Examination Guidelines: A complete evaluation includes B-mode imaging, spectral Doppler, color Doppler, and power Doppler as needed of all accessible portions of each vessel. Bilateral testing is considered an integral part of a complete examination. Limited examinations for reoccurring indications may be performed as noted.  Right Findings: +----------+------------+---------+-----------+----------+-------+  RIGHT      Compressible Phasicity Spontaneous Properties Summary  +----------+------------+---------+-----------+----------+-------+  Subclavian     Full        Yes        Yes                         +----------+------------+---------+-----------+----------+-------+  Left Findings: +----------+------------+---------+-----------+----------+-------+  LEFT       Compressible Phasicity Spontaneous Properties Summary  +----------+------------+---------+-----------+----------+-------+  IJV            Full        Yes        Yes                         +----------+------------+---------+-----------+----------+-------+  Subclavian     Full        Yes  Yes                         +----------+------------+---------+-----------+----------+-------+  Axillary       Full        Yes        Yes                          +----------+------------+---------+-----------+----------+-------+  Brachial       Full        Yes        Yes                         +----------+------------+---------+-----------+----------+-------+  Radial         Full                                               +----------+------------+---------+-----------+----------+-------+  Ulnar          Full                                               +----------+------------+---------+-----------+----------+-------+  Cephalic       Full                                               +----------+------------+---------+-----------+----------+-------+  Basilic        Full                                               +----------+------------+---------+-----------+----------+-------+  Summary:  Right: No evidence of thrombosis in the subclavian.  Left: No evidence of deep vein thrombosis in the upper extremity. No evidence of superficial vein thrombosis in the upper extremity.  *See table(s) above for measurements and observations.  Diagnosing physician: Harold Barban MD Electronically signed by Harold Barban MD on 01/19/2021 at 6:40:54 PM.    Final         Scheduled Meds:  calcium-vitamin D  1 tablet Oral BID WC   Chlorhexidine Gluconate Cloth  6 each Topical Daily   enoxaparin (LOVENOX) injection  40 mg Subcutaneous [id number]   folic acid  1 mg Oral Daily   gabapentin  300 mg Oral BID   insulin aspart  0-9 Units Subcutaneous TID WC   levothyroxine  150 mcg Oral QAC breakfast   lidocaine  1 patch Transdermal Q24H   pantoprazole  40 mg Oral Daily   potassium chloride  40 mEq Oral Daily   pramipexole  1.5 mg Oral TID   QUEtiapine  25 mg Oral QHS   senna-docusate  1 tablet Oral BID   sodium chloride flush  10-40 mL Intracatheter Q12H   sodium chloride  1 g Oral BID WC   vitamin B-12  1,000 mcg Oral Daily   Continuous Infusions:   ceFAZolin (ANCEF) IV 2 g (01/20/21 0543)     LOS: 8 days    Time spent: 35 minutes  Elmarie Shiley,  MD [facility]   If 7PM-7AM, please contact night-coverage [email or web address]  01/20/2021, 1:14 PM

## 2021-01-20 NOTE — Progress Notes (Signed)
Occupational Therapy Treatment Patient Details Name: Jillian Leblanc MRN: [id number] DOB: 1945-04-29 Today's Date: 01/20/2021   History of present illness Pt is a 75 y/o female admitted 12/20 from SNF for recent septic shock from [facility] bacteremia L4-S1 osteo with epidural abscess, s/p L45 lami and IV antibx.  Pt now admitted with AMS found to be due to UTI/Cauti. Ortho consulted for left shoulder dislocation noted during admission. 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 Not a candidate for surgery. PMHx:  DM2, HFpEF   OT comments  Patient received in bed and agreeable to attempt hoyer lift transfer into recliner. Patient participated in rolling side to side for cleaning and positioning hoyer pad with nursing tech assisting. Patient was lifted up with hoyer and patient admittedly had complaints of pain and stated she could not tolerate and was lowered back to bed.  Grooming tasks performed at bed level with assistance to brush hair due to limited shoulder ROM. Acute OT to continue to follow.     Recommendations for follow up therapy are one component of a multi-disciplinary discharge planning process, led by the attending physician.  Recommendations may be updated based on patient status, additional functional criteria and insurance authorization.    Follow Up Recommendations  Skilled nursing-short term rehab (<3 hours/day)    Assistance Recommended at Discharge Frequent or constant Supervision/Assistance  Equipment Recommendations  None recommended by OT    Recommendations for Other Services      Precautions / Restrictions Precautions Precautions: Fall;Back Precaution Booklet Issued: No Precaution Comments: Back for comfort Required Braces or Orthoses: Spinal Brace Spinal Brace: Thoracolumbosacral orthotic Restrictions Weight Bearing Restrictions: No        Mobility Bed Mobility Overal bed mobility: Needs Assistance Bed Mobility: Rolling Rolling: Mod assist         General bed mobility comments: rolled side to side for cleaning and positioning of hoyer pad    Transfers Overall transfer level: Needs assistance                 General transfer comment: hoyer lift attempted to perform transfer and patient was unable to tolerate due to pain     Balance                                           ADL either performed or assessed with clinical judgement   ADL Overall ADL's : Needs assistance/impaired     Grooming: Wash/dry hands;Wash/dry face;Oral care;Brushing hair;Moderate assistance;Bed level Grooming Details (indicate cue type and reason): required mod assist to brush hair, setup up for oral, face, and hand hygiene                               General ADL Comments: performed at bed leveldue to pain    Extremity/Trunk Assessment Upper Extremity Assessment RUE Deficits / Details: Pain and weakness with UE movment RUE Sensation: decreased light touch RUE Coordination: decreased gross motor;decreased fine motor            Vision       Perception     Praxis      Cognition Arousal/Alertness: Awake/alert Behavior During Therapy: Restless Overall Cognitive Status: Impaired/Different from baseline Area of Impairment: Memory;Following commands;Safety/judgement;Awareness;Problem solving  Orientation Level: Disoriented to;Time   Memory: Decreased short-term memory Following Commands: Follows one step commands with increased time;Follows multi-step commands inconsistently Safety/Judgement: Decreased awareness of safety;Decreased awareness of deficits Awareness: Intellectual Problem Solving: Slow processing;Decreased initiation;Difficulty sequencing;Requires verbal cues;Requires tactile cues General Comments: aware of place and year,did not know month or day           Exercises     Shoulder Instructions       General Comments pt on RA, anxious and restless, cognition appears worsened compared to previous session with this therapist    Pertinent Vitals/ Pain       Pain Assessment: Faces Faces Pain Scale: Hurts even more Pain Location: back and LUE Pain Descriptors / Indicators: Grimacing;Guarding;Aching Pain Intervention(s): Monitored during session;Repositioned;Limited activity within patient's tolerance  Home Living                                          Prior Functioning/Environment              Frequency  Min 2X/week        Progress Toward Goals  OT Goals(current goals can now be found in the care plan section)  Progress towards OT goals: Progressing toward goals  Acute Rehab OT Goals Patient Stated Goal: go home OT Goal Formulation: With patient Time For Goal Achievement: 01/29/21 Potential to Achieve Goals: Good ADL Goals Pt Will Perform Grooming: with supervision;standing Pt Will Perform Lower Body Bathing: with min guard assist;sitting/lateral leans;sit to/from stand Pt Will Perform Lower Body Dressing: with min guard assist;sitting/lateral leans;sit to/from stand Pt Will Transfer to Toilet: with min assist;ambulating Pt Will Perform Toileting - Clothing Manipulation and hygiene: with min assist;sitting/lateral leans;sit to/from stand Additional ADL Goal #1: Pt will recall 3/3 spinal precautions.  Plan Discharge plan remains appropriate    Co-evaluation                 AM-PAC OT "6 Clicks" Daily Activity     Outcome Measure   Help from another person eating meals?: A Little Help from another person taking care of personal grooming?: A Little Help from another person toileting, which includes using toliet, bedpan, or urinal?: A Lot Help from another person bathing (including washing, rinsing, drying)?: A Lot Help from another person to put on and taking off regular upper body  clothing?: A Little Help from another person to put on and taking off regular lower body clothing?: A Lot 6 Click Score: 15    End of Session Equipment Utilized During Treatment: Other (comment) (hoyer)  OT Visit Diagnosis: Unsteadiness on feet (R26.81);Muscle weakness (generalized) (M62.81);Repeated falls (R29.6)   Activity Tolerance Patient limited by pain   Patient Left in bed;with call bell/phone within reach;with bed alarm set   Nurse Communication Mobility status        Time: 4010-2725 OT Time Calculation (min): 30 min  Charges: OT General Charges $OT Visit: 1 Visit OT Treatments $Self Care/Home Management : 8-22 mins $Therapeutic Activity: 8-22 mins  .[email or web address]  Dewain Penning 01/20/2021, 2:21 PM

## 2021-01-21 LAB — CBC
HCT: 24.6 % — ABNORMAL LOW (ref 36.0–46.0)
Hemoglobin: 7.9 g/dL — ABNORMAL LOW (ref 12.0–15.0)
MCH: 28.8 pg (ref 26.0–34.0)
MCHC: 32.1 g/dL (ref 30.0–36.0)
MCV: 89.8 fL (ref 80.0–100.0)
Platelets: 325 10*3/uL (ref 150–400)
RBC: 2.74 MIL/uL — ABNORMAL LOW (ref 3.87–5.11)
RDW: 17.9 % — ABNORMAL HIGH (ref 11.5–15.5)
WBC: 5.2 10*3/uL (ref 4.0–10.5)
nRBC: 0 % (ref 0.0–0.2)

## 2021-01-21 LAB — GLUCOSE, CAPILLARY
Glucose-Capillary: 147 mg/dL — ABNORMAL HIGH (ref 70–99)
Glucose-Capillary: 192 mg/dL — ABNORMAL HIGH (ref 70–99)
Glucose-Capillary: 104 mg/dL — ABNORMAL HIGH (ref 70–99)
Glucose-Capillary: 145 mg/dL — ABNORMAL HIGH (ref 70–99)

## 2021-01-21 LAB — MAGNESIUM: Magnesium: 1.8 mg/dL (ref 1.7–2.4)

## 2021-01-21 MED ORDER — MAGNESIUM SULFATE 2 GM/50ML IV SOLN
2.0000 g | Freq: Once | INTRAVENOUS | Status: AC
  Administered 2021-01-21: 2 g via INTRAVENOUS
  Filled 2021-01-21: qty 50

## 2021-01-21 NOTE — Progress Notes (Signed)
PROGRESS NOTE    Jillian Leblanc  [id number] DOB: [birth year] DOA: 01/12/2021 PCP: Guadlupe Spanish, MD   Brief Narrative: 75 year old with past medical history significant for diabetes type 2, PAF, not on anticoagulation due to anemia per last discharge summary, sinus pauses status post pacemaker, CHF with preserved ejection fraction, hypothyroidism admitted with altered mental status.  Patient was discharged 5 days prior to this admission after being treated for E. coli bacteremia, lumbar osteomyelitis with epidural abscess underwent laminectomy with evacuation of abscess.  Discharged on IV Ancef until 03/01/2021 by ID.  She also had urinary retention, she was discharged with a Foley catheter.  At the skilled nursing facility patient became more confused, having auditory and visual hallucination.  She was found to have sepsis secondary to UTI/gouty and multiple electrolytes imbalance.    Assessment & Plan:   Principal Problem:   Sepsis ([place]) Active Problems:   Hyponatremia   Hypokalemia   Elevated troponin   Hypothyroidism   Acute metabolic encephalopathy   Chronic diastolic CHF (congestive heart failure) (HCC)   Hypocalcemia   Hypomagnesemia   Sepsis secondary to UTI ([place])   Acute urinary retention   Altered mental status   Dislocation closed   Disorientation   Left shoulder pain   Urinary tract infection without hematuria  1-Sepsis  secondary to indwelling foley catheter, CAUTI:  -Blood cultures: No growth. -Culture results Pseudomonas. -Patient completed 7 days of IV antibiotics cefepime. -Foley exchanged on [id number]  2-Acute Metabolic Encephalopathy secondary to urinary tract infection, delirium -Patient mental status fluctuates. -Treating infectious process -Confusion appears to slowly improve -Continue to have memory issues. B 12 237. Will start supplement.   3-Left shoulder pain and arm edema due to anterior shoulder dislocation -Ortho recommended follow-up  outpatient -Sling needed for comfort. -follow up out patient.   4-Elevation troponin:  Demand ischemia, remained flat   5-Hyponatremia:  Stable. Monitor.   Hypokalemia, hypomagnesemia;  Magnesium replaced.  Oral potassium/  History of recent E coli Bacteremia, Epidural Abscess and Laminectomy:  -S/P L4- L5 laminectomy with evacuation of epidural abscess on 12/12  by neurosurgery -Continue with IV Ancef until 03/01/2021 as recommended by ID. -having drainage from wound. Dr Zada Finders will see patient.   History of urinary retention: Continue with chronic Foley catheter placed prior admission 12/15 Foley Exchanged 12/21 Needs outpatient follow-up with urology  Chronic diastolic heart failure Holding Lasix due to dehydration Monitor   volume status  Recent diagnosis of PAF: Seen by cardiology last admission anticoagulation issues to be addressed as an outpatient  Anemia of chronic disease. Monitor hemoglobin  Type 2 diabetes: Blood globin A1c 8.4 in October Continue with sliding scale insulin  Hypothyroidism: Continue with levothyroxine.     Pressure Injury 12/19/20 Sacrum Medial Stage 2 -  Partial thickness loss of dermis presenting as a shallow open injury with a red, pink wound bed without slough. stage 2  sacrum  4cm x .5cm (Active)  12/19/20 1640  Location: Sacrum  Location Orientation: Medial  Staging: Stage 2 -  Partial thickness loss of dermis presenting as a shallow open injury with a red, pink wound bed without slough.  Wound Description (Comments): stage 2  sacrum  4cm x .5cm  Present on Admission: Yes    Estimated body mass index is 34.92 kg/m as calculated from the following:   Height as of this encounter: 5' (1.524 m).   Weight as of this encounter: 81.1 kg.   DVT prophylaxis: Lovenox Code Status: Full Code  Family Communication: Daughter over phone Disposition Plan:  Status is: Inpatient  Remains inpatient appropriate because: -Awaiting  disposition SNF>         Consultants:  None  Procedures:  None  Antimicrobials:  Ancef.  Completed Cefepime.    Subjective: Patient notice to have serosanguineous drainage from wound. Pad saturated. She denies worsening back pain.    Objective: Vitals:   01/20/21 1612 01/20/21 2105 01/21/21 0607 01/21/21 0926  BP: (!) 152/64 (!) 144/80 (!) 154/62 (!) 151/56  Pulse: 92 100 (!) 101 92  Resp: 18 17 18 18   Temp: 98.1 F (36.7 C) 98.4 F (36.9 C) 97.7 F (36.5 C) 98.2 F (36.8 C)  TempSrc: Oral  Oral   SpO2: 99% 97% 99% 99%  Weight:      Height:        Intake/Output Summary (Last 24 hours) at 01/21/2021 1335 Last data filed at 01/21/2021 1300 Gross per 24 hour  Intake 1213.36 ml  Output 2500 ml  Net -1286.64 ml    Filed Weights   01/12/21 1838 01/19/21 2115  Weight: 84.3 kg 81.1 kg    Examination:  General exam: NAD Respiratory system: CTA Cardiovascular system: S 1, S 2 RRR Gastrointestinal system: ABS present, soft, nt Central nervous system: Alert, oriented.  Extremities: No edema    Data Reviewed: I have personally reviewed following labs and imaging studies  CBC: Recent Labs  Lab 01/18/21 0408 01/18/21 2100 01/19/21 0211 01/20/21 0325 01/21/21 0459  WBC 4.6 5.4 4.3 4.6 5.2  NEUTROABS  --  3.3  --   --   --   HGB 7.2* 7.6* 7.5* 7.8* 7.9*  HCT 22.9* 24.1* 23.9* 24.2* 24.6*  MCV 94.2 92.3 93.7 92.4 89.8  PLT 335 332 313 298 325    Basic Metabolic Panel: Recent Labs  Lab 01/17/21 0326 01/18/21 0408 01/18/21 2100 01/19/21 0211 01/20/21 0325 01/21/21 0820  NA 132* 133* 132* 133* 132*  --   K 3.7 4.3 4.0 3.9 3.6  --   CL 104 105 105 105 104  --   CO2 23 22 20* 22 21*  --   GLUCOSE 130* 110* 136* 127* 120*  --   BUN 7* 8 8 8 8   --   CREATININE 0.59 0.74 0.61 0.60 0.61  --   CALCIUM 6.7* 6.8* 6.9* 6.9* 7.0*  --   MG 1.8 2.0 1.6* 1.6* 1.6* 1.8    GFR: Estimated Creatinine Clearance: 59 mL/min (by C-G formula based on SCr of  0.61 mg/dL). Liver Function Tests: Recent Labs  Lab 01/16/21 0302 01/18/21 2100  AST 19 28  ALT 5 7  ALKPHOS 142* 183*  BILITOT 0.9 1.0  PROT 5.4* 5.9*  ALBUMIN 1.7* 1.7*    No results for input(s): LIPASE, AMYLASE in the last 168 hours. No results for input(s): AMMONIA in the last 168 hours. Coagulation Profile: No results for input(s): INR, PROTIME in the last 168 hours. Cardiac Enzymes: No results for input(s): CKTOTAL, CKMB, CKMBINDEX, TROPONINI in the last 168 hours. BNP (last 3 results) No results for input(s): PROBNP in the last 8760 hours. HbA1C: No results for input(s): HGBA1C in the last 72 hours. CBG: Recent Labs  Lab 01/20/21 1122 01/20/21 1609 01/20/21 2102 01/21/21 0649 01/21/21 1125  GLUCAP 133* 210* 175* 147* 192*    Lipid Profile: No results for input(s): CHOL, HDL, LDLCALC, TRIG, CHOLHDL, LDLDIRECT in the last 72 hours. Thyroid Function Tests: Recent Labs    01/18/21 2100 01/19/21 0211  TSH 1.796  --   FREET4 2.23*  --   T3FREE  --  1.3*    Anemia Panel: No results for input(s): VITAMINB12, FOLATE, FERRITIN, TIBC, IRON, RETICCTPCT in the last 72 hours. Sepsis Labs: No results for input(s): PROCALCITON, LATICACIDVEN in the last 168 hours.  Recent Results (from the past 240 hour(s))  Urine Culture     Status: Abnormal   Collection Time: 01/12/21  1:00 PM   Specimen: In/Out Cath Urine  Result Value Ref Range Status   Specimen Description IN/OUT CATH URINE  Final   Special Requests   Final    NONE Performed at [facility], [address], [place], [place] [address]    Culture >=100,000 COLONIES/mL PSEUDOMONAS AERUGINOSA (A)  Final   Report Status 01/15/2021 FINAL  Final   Organism ID, Bacteria PSEUDOMONAS AERUGINOSA (A)  Final      Susceptibility   Pseudomonas aeruginosa - MIC*    CEFTAZIDIME 2 SENSITIVE Sensitive     CIPROFLOXACIN <=0.25 SENSITIVE Sensitive     GENTAMICIN <=1 SENSITIVE Sensitive     IMIPENEM 2 SENSITIVE  Sensitive     PIP/TAZO <=4 SENSITIVE Sensitive     CEFEPIME 2 SENSITIVE Sensitive     * >=100,000 COLONIES/mL PSEUDOMONAS AERUGINOSA  Resp Panel by RT-PCR (Flu A&B, Covid)     Status: None   Collection Time: 01/12/21  7:07 PM   Specimen: Nasopharyngeal(NP) swabs in vial transport medium  Result Value Ref Range Status   SARS Coronavirus 2 by RT PCR NEGATIVE NEGATIVE Final    Comment: (NOTE) SARS-CoV-2 target nucleic acids are NOT DETECTED.  The SARS-CoV-2 RNA is generally detectable in upper respiratory specimens during the acute phase of infection. The lowest concentration of SARS-CoV-2 viral copies this assay can detect is 138 copies/mL. A negative result does not preclude SARS-Cov-2 infection and should not be used as the sole basis for treatment or other patient management decisions. A negative result may occur with  improper specimen collection/handling, submission of specimen other than nasopharyngeal swab, presence of viral mutation(s) within the areas targeted by this assay, and inadequate number of viral copies(<138 copies/mL). A negative result must be combined with clinical observations, patient history, and epidemiological information. The expected result is Negative.  Fact Sheet for Patients:  EntrepreneurPulse.com.au  Fact Sheet for Healthcare Providers:  IncredibleEmployment.be  This test is no t yet approved or cleared by the [place] FDA and  has been authorized for detection and/or diagnosis of SARS-CoV-2 by FDA under an Emergency Use Authorization (EUA). This EUA will remain  in effect (meaning this test can be used) for the duration of the COVID-19 declaration under Section 564(b)(1) of the Act, 21 U.S.C.section 360bbb-3(b)(1), unless the authorization is terminated  or revoked sooner.       Influenza A by PCR NEGATIVE NEGATIVE Final   Influenza B by PCR NEGATIVE NEGATIVE Final    Comment: (NOTE) The Xpert Xpress  SARS-CoV-2/FLU/RSV plus assay is intended as an aid in the diagnosis of influenza from Nasopharyngeal swab specimens and should not be used as a sole basis for treatment. Nasal washings and aspirates are unacceptable for Xpert Xpress SARS-CoV-2/FLU/RSV testing.  Fact Sheet for Patients: EntrepreneurPulse.com.au  Fact Sheet for Healthcare Providers: IncredibleEmployment.be  This test is not yet approved or cleared by the [place] FDA and has been authorized for detection and/or diagnosis of SARS-CoV-2 by FDA under an Emergency Use Authorization (EUA). This EUA will remain in effect (meaning this test can be  used) for the duration of the COVID-19 declaration under Section 564(b)(1) of the Act, 21 U.S.C. section 360bbb-3(b)(1), unless the authorization is terminated or revoked.  Performed at [facility], [place] [address] [place], [place] [address]   Blood Culture (routine x 2)     Status: None   Collection Time: 01/12/21  7:07 PM   Specimen: BLOOD  Result Value Ref Range Status   Specimen Description BLOOD LEFT ANTECUBITAL  Final   Special Requests   Final    BOTTLES DRAWN AEROBIC AND ANAEROBIC Blood Culture adequate volume   Culture   Final    NO GROWTH 5 DAYS Performed at [facility], [place] [address], [place], [place] [address]    Report Status 01/17/2021 FINAL  Final  Blood Culture (routine x 2)     Status: None   Collection Time: 01/13/21  8:08 AM   Specimen: BLOOD  Result Value Ref Range Status   Specimen Description BLOOD LEFT ANTECUBITAL  Final   Special Requests   Final    BOTTLES DRAWN AEROBIC AND ANAEROBIC Blood Culture adequate volume   Culture   Final    NO GROWTH 5 DAYS Performed at [facility], [place] [address] [place], [place] [address]    Report Status 01/18/2021 FINAL  Final          Radiology Studies: No results found.      Scheduled Meds:  calcium-vitamin D  1 tablet Oral BID WC    Chlorhexidine Gluconate Cloth  6 each Topical Daily   enoxaparin (LOVENOX) injection  40 mg Subcutaneous [id number]   folic acid  1 mg Oral Daily   gabapentin  300 mg Oral BID   insulin aspart  0-9 Units Subcutaneous TID WC   levothyroxine  150 mcg Oral QAC breakfast   lidocaine  1 patch Transdermal Q24H   magnesium oxide  400 mg Oral BID   pantoprazole  40 mg Oral Daily   potassium chloride  40 mEq Oral Daily   pramipexole  1.5 mg Oral TID   QUEtiapine  25 mg Oral QHS   senna-docusate  1 tablet Oral BID   sodium chloride flush  10-40 mL Intracatheter Q12H   sodium chloride  1 g Oral BID WC   vitamin B-12  1,000 mcg Oral Daily   Continuous Infusions:   ceFAZolin (ANCEF) IV 2 g (01/21/21 0547)     LOS: 9 days    Time spent: 35 minutes     Natali Lavallee A Daviyon Widmayer, MD [facility]   If 7PM-7AM, please contact night-coverage [email or web address]  01/21/2021, 1:35 PM

## 2021-01-21 NOTE — Consult Note (Signed)
WOC Nurse Consult Note: Reason for Consult:Patient has healing surgical wound from laminectomy L4 and L5 with subsequent infection.  She has been placing dry dressings over this area.  She states she does not really have pain in this area.   She is able to turn self in bed with minimal assistance.  Wound type:surgical/infectious Pressure Injury POA:NA Measurement:  6 cm suture line with nonintact edge in the center.  Serosanguinous drainage noted from this area. Wound DGL:OVFI visible Drainage (amount, consistency, odor) moderate serosanguinous  no odor Periwound: suture line.  No erythema, edema or tenderness on suture line Dressing procedure/placement/frequency: Cleanse lumbar surgical site with NS. Apply strip of aquacel (LAWSON # [id number])  to suture line to absorb drainage and provide antimicrobial protection.  Cover with foam dressing.  Change every other day.  Date/time dressing.  Peel back dressing and assess site each shift.  Notify neurosurgery if drainage increases, pain or erythema develops.  Will not follow at this time.  Please re-consult if needed.  Maple Hudson MSN, RN, FNP-BC CWON Wound, Ostomy, Continence Nurse Pager [phone]

## 2021-01-22 LAB — GLUCOSE, CAPILLARY
Glucose-Capillary: 114 mg/dL — ABNORMAL HIGH (ref 70–99)
Glucose-Capillary: 186 mg/dL — ABNORMAL HIGH (ref 70–99)

## 2021-01-22 MED ORDER — POTASSIUM CHLORIDE 20 MEQ PO PACK: 40.0000 meq | PACK | Freq: Every day | ORAL | 1 refills | Status: AC

## 2021-01-22 MED ORDER — FOLIC ACID 1 MG PO TABS: 1.0000 mg | ORAL_TABLET | Freq: Every day | ORAL | 0 refills | Status: AC

## 2021-01-22 MED ORDER — MAGNESIUM OXIDE -MG SUPPLEMENT 400 (240 MG) MG PO TABS: 400.0000 mg | ORAL_TABLET | Freq: Two times a day (BID) | ORAL | 0 refills | Status: AC

## 2021-01-22 MED ORDER — CYANOCOBALAMIN 1000 MCG PO TABS: 1000.0000 ug | ORAL_TABLET | Freq: Every day | ORAL | 0 refills | Status: AC

## 2021-01-22 MED ORDER — SENNOSIDES-DOCUSATE SODIUM 8.6-50 MG PO TABS: 1.0000 | ORAL_TABLET | Freq: Two times a day (BID) | ORAL | 0 refills | Status: AC

## 2021-01-22 MED ORDER — HEPARIN SOD (PORK) LOCK FLUSH 100 UNIT/ML IV SOLN
250.0000 [IU] | INTRAVENOUS | Status: AC | PRN
  Administered 2021-01-22: 250 [IU]
  Filled 2021-01-22: qty 2.5

## 2021-01-22 MED ORDER — TRAZODONE HCL 50 MG PO TABS: 50.0000 mg | ORAL_TABLET | Freq: Every evening | ORAL | 0 refills | Status: AC | PRN

## 2021-01-22 NOTE — Consult Note (Signed)
Neurosurgery Consultation  Reason for Consult: Post-op wound concerns Referring Physician: Regalado  CC: Sepsis  HPI: This is a 75 y.o. woman in whom I previously performed a decompression and PSIF for a very severe epidural abscess with osteomyelitis and pathologic fracture. She was discharged and returned with a recurrent sepsis picture. From a spine standpoint, she has actually done quite well, minimal back pain (was quite severe before) and foot weakness is improving. Nursing reports some scant drainage from the wound, serous.   ROS: A 14 point ROS was performed and is negative except as noted in the HPI.   PMHx:  Past Medical History:  Diagnosis Date   CHF (congestive heart failure) (HCC)    Chronic back pain    Diabetes mellitus without complication (HCC)    Dysrhythmia    Hypothyroidism    Interstitial cystitis    Presence of permanent cardiac pacemaker    Thyroid disease    FamHx: No family history on file. SocHx:  reports that she has never smoked. She has never used smokeless tobacco. She reports that she does not drink alcohol and does not use drugs.  Exam: Vital signs in last 24 hours: Temp:  [97.9 F (36.6 C)-98.2 F (36.8 C)] 98.2 F (36.8 C) (12/30 0412) Pulse Rate:  [75-84] 75 (12/30 0412) Resp:  [17-20] 20 (12/30 0412) BP: (133-157)/(62-67) 157/63 (12/30 0412) SpO2:  [100 %] 100 % (12/30 0412) General: Awake, alert, cooperative, lying in bed in NAD Head: Normocephalic and atruamatic HEENT: Neck supple Pulmonary: breathing room air comfortably, no evidence of increased work of breathing Cardiac: RRR Abdomen: S NT ND Extremities: Warm and well perfused x4 Neuro: Strength 5/5 x4 except bilateral EHL > DF weakness, SILTx4, incision well healed except for a small superficial dehiscence inferiorly with healthy appearing tissue, no erythema / induration / purulence   Assessment and Plan: 75 y.o. woman s/p decompression and PSIF for epidural abscess with osteo  and pathologic fracture.   -exam reassuring regarding her wound, agree with wound care recs, no surgical intervention recommended, can follow up with me in clinic in 2 weeks for a repeat evaluation   Jadene Pierini, MD 01/22/21 9:29 AM [facility]

## 2021-01-22 NOTE — Progress Notes (Signed)
DISCHARGE NOTE HOME Jillian Leblanc to be discharged Home per MD order. Discussed prescriptions and follow up appointments with the patient. Prescriptions given to patient; medication list explained in detail. Patient verbalized understanding.  Skin clean, dry and intact without evidence of skin break down, no evidence of skin tears noted. Site without signs and symptoms of complications. Dressing and pressure applied. Pt denies pain at the site currently. No complaints noted.  Patient free of lines, drains, and wounds.   An After Visit Summary (AVS) was printed and given PTAR. Patient escorted via wheelchair, and discharged home via PTAR  Jillian Leblanc S Jillian Mathias, RN

## 2021-01-22 NOTE — Discharge Summary (Signed)
Physician Discharge Summary  Jillian Leblanc CHY:[id number] DOB: 21-May-1945 DOA: 01/12/2021  PCP: Guadlupe Spanish, MD  Admit date: 01/12/2021 Discharge date: 01/22/2021  Admitted From: SNF Disposition:  Home   Recommendations for Outpatient Follow-up:  Follow up with PCP in 1-2 weeks Please obtain BMP/CBC in one week Needs to follow up with Dr Zada Finders for further care back infection.  Needs outpatient follow-up with urology  Home Health: Yes. Family decline SNF.   Discharge Condition: Stable.  CODE STATUS: Full code Diet recommendation: Heart Healthy   Brief/Interim Summary: 75 year old with past medical history significant for diabetes type 2, PAF, not on anticoagulation due to anemia per last discharge summary, sinus pauses status post pacemaker, CHF with preserved ejection fraction, hypothyroidism admitted with altered mental status.  Patient was discharged 5 days prior to this admission after being treated for E. coli bacteremia, lumbar osteomyelitis with epidural abscess underwent laminectomy with evacuation of abscess.  Discharged on IV Ancef until 03/01/2021 by ID.  75, she was discharged with a Foley catheter.  At the skilled nursing facility patient became more confused, having auditory and visual hallucination.  She was found to have sepsis secondary to UTI/gouty and multiple electrolytes imbalance.     Discharge Diagnoses:  Principal Problem:   Sepsis ([place]) Active Problems:   Hyponatremia   Hypokalemia   Elevated troponin   Hypothyroidism   Acute metabolic encephalopathy   Chronic diastolic CHF (congestive heart failure) ([facility])   Hypocalcemia   Hypomagnesemia   Sepsis secondary to UTI ([place])   Acute urinary retention   Altered mental status   Dislocation closed   Disorientation   Left shoulder pain   Urinary tract infection without hematuria  1-Sepsis  secondary to indwelling foley catheter, CAUTI:  -Blood cultures: No  growth. -Culture results Pseudomonas. -Patient completed 7 days of IV antibiotics cefepime. -Foley exchanged on 12/21   2-Acute Metabolic Encephalopathy secondary to urinary tract infection, delirium -Patient mental status fluctuates. -Treating infectious process -Confusion appears to slowly improve -Continue to have memory issues. B 12 237.  started supplement.   improving.   3-Left shoulder pain and arm edema due to anterior shoulder dislocation -Ortho recommended follow-up outpatient, no candidate for surgery currently due to current infection and encephalopathy  -Sling needed for comfort. -follow up out patient, with Dr Jillian Graham.    4-Elevation troponin:  Demand ischemia, remained flat    5-Hyponatremia:  Stable. Monitor.    Hypokalemia, hypomagnesemia;  Magnesium replaced.  Oral potassium/   History of recent E coli Bacteremia, Epidural Abscess and Laminectomy:  -S/P L4- L5 laminectomy with evacuation of epidural abscess on 12/12  by neurosurgery -Continue with IV Ancef until 03/01/2021 as recommended by ID. -having drainage from wound. Dr Zada Finders saw patient and wound is stable.    History of urinary retention: Continue with chronic Foley catheter placed prior admission 12/15 Foley Exchanged 12/21 Needs outpatient follow-up with urology   Chronic diastolic heart failure Monitor   volume status Resume lasix at discharge   Recent diagnosis of PAF: Seen by cardiology last admission anticoagulation issues to be addressed as an outpatient   Anemia of chronic disease. Monitor hemoglobin. Stable.    Type 2 diabetes: Blood globin A1c 8.4 in October Continue with sliding scale insulin   Hypothyroidism: Continue with levothyroxine.        See below wound care documentation  Pressure Injury 12/19/20 Sacrum Medial Stage 2 -  Partial thickness loss of dermis presenting as a shallow open  injury with a red, pink wound bed without slough. stage 2  sacrum  4cm x .5cm (Active)   12/19/20 1640  Location: Sacrum  Location Orientation: Medial  Staging: Stage 2 -  Partial thickness loss of dermis presenting as a shallow open injury with a red, pink wound bed without slough.  Wound Description (Comments): stage 2  sacrum  4cm x .5cm  Present on Admission: Yes       Discharge Instructions  Discharge Instructions     Diet - low sodium heart healthy   Complete by: As directed    Discharge wound care:   Complete by: As directed    See above   Increase activity slowly   Complete by: As directed       Allergies as of 01/22/2021       Reactions   Pentosan Polysulfate Sodium Other (See Comments)   "ELMIRON Y Drug hair fell out 10/21/2009 12:00:00 AM by [name] CNA 1"   Elmiron [pentosan Polysulfate] Other (See Comments)   "Hair fell out"   Naloxone Other (See Comments)   Hallucinations/Confusion/Nightmares        Medication List     TAKE these medications    acetaminophen 500 MG tablet Commonly known as: TYLENOL Take 2 tablets (1,000 mg total) by mouth every 8 (eight) hours as needed. What changed: reasons to take this   calcium-vitamin D 250-100 MG-UNIT tablet Take 1 tablet by mouth 2 (two) times daily.   ceFAZolin  IVPB Commonly known as: ANCEF Inject 2 g into the vein every 8 (eight) hours. Indication: E.coli epidural abscess/osteo First Dose: Yes Last Day of Therapy:  03/01/21 Labs - Once weekly:  CBC/D and BMP, Labs - Every other week:  ESR and CRP Method of administration: IV Push Method of administration may be changed at the discretion of home infusion pharmacist based upon assessment of the patient and/or caregiver's ability to self-administer the medication ordered.   cyanocobalamin 1000 MCG tablet Take 1 tablet (1,000 mcg total) by mouth daily. Start taking on: January 23, 1218   folic acid 1 MG tablet Commonly known as: FOLVITE Take 1 tablet (1 mg total) by mouth daily. Start taking on: January 23, 2021   furosemide  40 MG tablet Commonly known as: LASIX Take 1 tablet (40 mg total) by mouth daily.   gabapentin 300 MG capsule Commonly known as: NEURONTIN Take 1 capsule (300 mg total) by mouth 2 (two) times daily. What changed: when to take this   insulin aspart 100 UNIT/ML injection Commonly known as: novoLOG 0-9 Units, Subcutaneous, 3 times daily with meals, Sensitive (thin, NPO, renal) CBG < 70: Implement Hypoglycemia measures CBG 70 - 120: 0 units CBG 121 - 150: 1 unit CBG 151 - 200: 2 units CBG 201 - 250: 3 units CBG 251 - 300: 5 units CBG 301 - 350: 7 units CBG 351 - 400: 9 units CBG > 400: call MD What changed:  how much to take how to take this when to take this additional instructions   ipratropium-albuterol 0.5-2.5 (3) MG/3ML Soln Commonly known as: DUONEB Take 3 mLs by nebulization every 8 (eight) hours.   levothyroxine 150 MCG tablet Commonly known as: SYNTHROID Take 150 mcg by mouth daily before breakfast.   lidocaine 5 % Commonly known as: LIDODERM Place 1 patch onto the skin daily. Remove & Discard patch within 12 hours or as directed by MD What changed:  when to take this additional instructions   magnesium oxide  400 (240 Mg) MG tablet Commonly known as: MAG-OX Take 1 tablet (400 mg total) by mouth 2 (two) times daily.   oxyCODONE 5 MG immediate release tablet Commonly known as: Oxy IR/ROXICODONE Take 1-2 tablets (5-10 mg total) by mouth every 6 (six) hours as needed for moderate pain or severe pain.   OXYGEN Inhale 2 L/min into the lungs continuous.   pantoprazole 40 MG tablet Commonly known as: PROTONIX Take 1 tablet (40 mg total) by mouth daily. What changed: when to take this   polyethylene glycol 17 g packet Commonly known as: MIRALAX / GLYCOLAX Take 17 g by mouth daily as needed. What changed: reasons to take this   potassium chloride 20 MEQ packet Commonly known as: KLOR-CON Take 40 mEq by mouth daily. Start taking on: January 23, 2021   pramipexole  1.5 MG tablet Commonly known as: MIRAPEX Take 1.5 mg by mouth 3 (three) times daily.   QUEtiapine 25 MG tablet Commonly known as: SEROQUEL Take 1 tablet (25 mg total) by mouth at bedtime.   senna-docusate 8.6-50 MG tablet Commonly known as: Senokot-S Take 1 tablet by mouth 2 (two) times daily.   sodium chloride 1 g tablet Take 1 tablet (1 g total) by mouth 2 (two) times daily with a meal.   traZODone 50 MG tablet Commonly known as: DESYREL Take 1 tablet (50 mg total) by mouth at bedtime as needed for sleep.               Durable Medical Equipment  (From admission, onward)           Start     Ordered   01/16/21 1115  For home use only DME Hospital bed  Once       Question Answer Comment  Length of Need 6 Months   Patient has (list medical condition): CHF - back surgery   The above medical condition requires: Patient requires the ability to reposition frequently   Head must be elevated greater than: 30 degrees   Bed type Semi-electric   [employer] Yes   Trapeze Bar Yes   Support Surface: Gel Overlay      01/16/21 1116              Discharge Care Instructions  (From admission, onward)           Start     Ordered   01/22/21 0000  Discharge wound care:       Comments: See above   01/22/21 0940            Follow-up Information     [facility] Follow up.   Specialty: [place] Why: the office will call next week to schedule home health visits Contact information: [place] [address] [phone]         Justice Britain, MD Follow up in 2 week(s).   Specialty: Orthopedic Surgery Contact information: [address] [place] [address] [phone]         Judith Part, MD Follow up in 2 week(s).   Specialty: Neurosurgery Contact information: [place] [address] [phone]                Allergies  Allergen Reactions    Pentosan Polysulfate Sodium Other (See Comments)    "ELMIRON Y Drug hair fell out 10/21/2009 12:00:00 AM by [name] CNA 1"   Elmiron [Pentosan Polysulfate] Other (See Comments)    "  Hair fell out"   Naloxone Other (See Comments)    Hallucinations/Confusion/Nightmares    Consultations: Neurosurgery    Procedures/Studies: DG Chest 1 View  Result Date: 01/12/2021 CLINICAL DATA:  Altered mental status EXAM: CHEST  1 VIEW COMPARISON:  01/06/2021 FINDINGS: Lungs are clear. No pneumothorax or pleural effusion. Right upper extremity PICC line tip is seen within the superior cavoatrial junction. Cardiac size within normal limits. Left subclavian dual lead pacemaker is unchanged with leads overlying the right atrium and right ventricle. Pulmonary vascularity is normal. No acute bone abnormality. Right total shoulder arthroplasty has been performed. IMPRESSION: No active disease. Electronically Signed   By: Fidela Salisbury M.D.   On: 01/12/2021 20:36   DG Lumbar Spine 2-3 Views  Result Date: 01/04/2021 CLINICAL DATA:  Intraoperative evaluation, laminectomy infusion EXAM: LUMBAR SPINE - 2-3 VIEW COMPARISON:  01/04/2021 FINDINGS: Four fluoroscopic images are obtained during the performance of the procedure and are provided for interpretation only. Images demonstrate intrapedicular screws within for contiguous levels of the lumbar spine. Evaluation is limited due to technique and body habitus. Please refer to operative report. FLUOROSCOPY TIME:  2 minutes and 7 seconds IMPRESSION: 1. Limited intraoperative evaluation during multilevel lumbar levin ectomy infusion. Electronically Signed   By: Randa Ngo M.D.   On: 01/04/2021 23:30   DG Elbow 2 Views Left  Result Date: 12/30/2020 CLINICAL DATA:  Left elbow pain and swelling. EXAM: LEFT ELBOW - 2 VIEW COMPARISON:  None. FINDINGS: There is no evidence of fracture, dislocation, or joint effusion. There is no evidence of arthropathy or other focal bone  abnormality. Soft tissues are unremarkable. IMPRESSION: Negative. Electronically Signed   By: Marijo Conception M.D.   On: 12/30/2020 11:01   DG Elbow Complete Left  Result Date: 01/12/2021 CLINICAL DATA:  Left elbow pain EXAM: LEFT ELBOW - COMPLETE 3+ VIEW COMPARISON:  None. FINDINGS: Normal alignment. No acute fracture or dislocation. Joint spaces appear preserved. No definite effusion though lateral positioning is suboptimal. Extensive diffuse soft tissue swelling is seen of the visualized left upper extremity. IMPRESSION: Diffuse soft tissue swelling.  No acute fracture or dislocation. Electronically Signed   By: Fidela Salisbury M.D.   On: 01/12/2021 20:37   DG Forearm Left  Result Date: 01/12/2021 CLINICAL DATA:  Left arm pain EXAM: LEFT FOREARM - 2 VIEW COMPARISON:  None. FINDINGS: No acute fracture or dislocation. Normal alignment. No osseous erosions or abnormal periosteal reaction. Diffuse subcutaneous edema is seen of the left forearm more severe along the dorsal aspect. IMPRESSION: Extensive soft tissue swelling.  No acute fracture or dislocation. Electronically Signed   By: Fidela Salisbury M.D.   On: 01/12/2021 20:40   DG Wrist Complete Left  Result Date: 01/12/2021 CLINICAL DATA:  Left wrist pain EXAM: LEFT WRIST - COMPLETE 3+ VIEW COMPARISON:  None. FINDINGS: Normal alignment. No acute fracture or dislocation. Joint spaces are preserved. Extensive surrounding soft tissue swelling is noted. Mild dystrophic calcification involving the TFCC versus remote nonunited ulnar styloid fracture. IMPRESSION: No acute fracture or dislocation.  Extensive soft tissue swelling. Electronically Signed   By: Fidela Salisbury M.D.   On: 01/12/2021 20:41   CT LUMBAR SPINE WO CONTRAST  Result Date: 01/04/2021 CLINICAL DATA:  Low back pain, cauda equina syndrome suspected EXAM: CT LUMBAR SPINE WITHOUT CONTRAST TECHNIQUE: Multidetector CT imaging of the lumbar spine was performed without intravenous contrast  administration. Multiplanar CT image reconstructions were also generated. COMPARISON:  CT 12/19/2020 FINDINGS: Segmentation: 5 lumbar type vertebrae.  Alignment: Focal kyphosis at T12 related to a T12 anterior compression deformity. No significant listhesis. Vertebrae: Progressive sclerosis and bony destruction of the L5 vertebral body. Height loss remains at about 40%. There is approximately 4 mm of bony retropulsion, similar to prior exam. Paraspinal and other soft tissues: There is bilateral hydronephrosis likely related to a markedly distended urinary bladder which is partially visualized. Aortoiliac atherosclerotic calcifications. No lymphadenopathy. There is a right-sided sacral nerve stimulator in place. Disc levels: Unchanged chronic disc and degenerative endplate changes at Y65-L93, T12-L1, and L1-L2. L2-L3: No significant spinal canal or neural foraminal narrowing. L3-L4: Nonspecific mineralization along the anterior spinal canal in a linear and longitudinal orientation. This is new since recent CT on November 26th. L4-L5: Unchanged broad-based disc bulging. Decreased amount of paraspinal gas but increased soft tissue density along the anterior and lateral paraspinal soft tissues (axial series 3, image 96). Moderate to severe right and mild-to-moderate left-sided neural foraminal narrowing. There is mild to moderate spinal canal narrowing. L5-S1: Decreased amount of intradiscal and paraspinal gas. Unchanged degree of moderate spinal canal stenosis and bilateral neural foraminal narrowing. IMPRESSION: Progressive sclerosis and bony destruction of the L5 vertebral body, though with unchanged overall 40% height loss. Decreased paraspinal and intradiscal gas. Increased anterior and lateral paraspinal soft tissue density at L4-L5. Findings are concerning for discitis osteomyelitis with paraspinal phlegmon. Unchanged degree of spinal canal stenosis and bilateral neural foraminal narrowing at L4-L5 and L5-S1.  Assessment for epidural collections is limited by noncontrast CT. If able, MRI would be ideal for further evaluation. Otherwise CT with contrast may be useful. Mineralization along the anterior spinal canal at L3-L4, in a linear and longitudinal orientation, new since recent CT on 11/26. 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 Electronically Signed   By: Maurine Simmering M.D.   On: 01/04/2021 11:11   CT ELBOW LEFT WO CONTRAST  Result Date: 01/02/2021 CLINICAL DATA:  Soft tissue infection suspected, elbow, xray done E. coli bacteremia with lumbar spine osteo-/epidural abscess-with persistent left elbow pain. EXAM: CT OF THE UPPER LEFT EXTREMITY WITHOUT CONTRAST TECHNIQUE: Multidetector CT imaging of the upper left extremity was performed according to the standard protocol. COMPARISON:  X-ray 12/30/2020 FINDINGS: Technical note: Examination is limited secondary to beam hardening artifact related to patient positioning of the elbow adjacent to the abdominal wall. Elbow is flexed resulting in nonstandard imaging planes. Bones/Joint/Cartilage No acute fracture. No dislocation. Elbow joint spaces are preserved. No erosion or periosteal elevation. No appreciable elbow joint effusion. Ligaments Suboptimally assessed by CT. Muscles and Tendons Evaluation of the musculotendinous structures is significantly limited. No obvious intramuscular fluid collection. Soft tissues Soft tissue edema with ill-defined fluid most pronounced along the ulnar aspect of the proximal forearm. No organized fluid collection is evident by noncontrast CT. No soft tissue gas. Catheter tubing is visualized within the upper arm. IMPRESSION: 1. Limited exam. 2. Soft tissue edema with ill-defined fluid most pronounced along the ulnar aspect of the proximal forearm, suggesting cellulitis. No organized fluid collection is evident. 3.  No appreciable elbow joint effusion to suggest septic arthritis. 4. No acute osseous abnormality. Electronically Signed   By: Davina Poke D.O.   On: 01/02/2021 17:17   DG Chest Port 1V same Day  Result Date: 01/06/2021 CLINICAL DATA:  Shortness of breath EXAM: PORTABLE CHEST 1 VIEW COMPARISON:  12/21/2020 FINDINGS: Transverse diameter of heart is increased. There  are no signs of alveolar pulmonary edema. Increased interstitial markings are seen in the left lower lung fields. There is no focal consolidation. There is no pleural effusion or pneumothorax. Pacemaker battery is seen in the left infraclavicular region. There is previous right shoulder arthroplasty. IMPRESSION: Increased interstitial markings are seen in the left lower lung fields suggesting interstitial pneumonitis. Less likely possibility would be asymmetric pulmonary edema. Electronically Signed   By: Elmer Picker M.D.   On: 01/06/2021 11:13   DG Shoulder Left  Result Date: 01/17/2021 CLINICAL DATA:  Shoulder pain EXAM: LEFT SHOULDER - 2+ VIEW COMPARISON:  01/12/2021 chest x-ray FINDINGS: Limited two-view exam. There is anterior dislocation of the humerus in relation to the glenoid fossa. No displaced fracture. AC joint aligned. Included chest demonstrates left subclavian 2 lead pacer. Right PICC line tip lower SVC level. Heart is enlarged. IMPRESSION: Left shoulder anterior dislocation. Electronically Signed   By: Jerilynn Mages.  Shick M.D.   On: 01/17/2021 10:17   DG Shoulder Left Port  Result Date: 01/18/2021 CLINICAL DATA:  Left shoulder dislocation with EXAM: LEFT SHOULDER COMPARISON:  Earlier radiograph dated 01/17/2021. FINDINGS: Persistent anterior dislocation of the left shoulder. No obvious acute fracture. The bones are osteopenic. Left pectoral pacemaker device. The soft tissues are grossly unremarkable. IMPRESSION: Persistent anterior dislocation of the left shoulder. Electronically Signed   By: Anner Crete M.D.   On:  01/18/2021 00:05   DG C-Arm 1-60 Min-No Report  Result Date: 01/04/2021 Fluoroscopy was utilized by the requesting physician.  No radiographic interpretation.   DG C-Arm 1-60 Min-No Report  Result Date: 01/04/2021 Fluoroscopy was utilized by the requesting physician.  No radiographic interpretation.   VAS [place] UPPER EXTREMITY VENOUS DUPLEX  Result Date: 01/19/2021 UPPER VENOUS STUDY  Patient Name:  Jillian Leblanc  Date of Exam:   01/18/2021 Medical Rec #: [id number]       Accession #:    [id number] Date of Birth: 11/27/1945       Patient Gender: F Patient Age:   25 years Exam Location:  [facility] Procedure:      VAS [place] UPPER EXTREMITY VENOUS DUPLEX Referring Phys: Emeterio Reeve --------------------------------------------------------------------------------  Indications: Swelling, and Pain Comparison Study: prior 01/13/21 Performing Technologist: Archie Patten RVS  Examination Guidelines: A complete evaluation includes B-mode imaging, spectral Doppler, color Doppler, and power Doppler as needed of all accessible portions of each vessel. Bilateral testing is considered an integral part of a complete examination. Limited examinations for reoccurring indications may be performed as noted.  Right Findings: +----------+------------+---------+-----------+----------+-------+  RIGHT      Compressible Phasicity Spontaneous Properties Summary  +----------+------------+---------+-----------+----------+-------+  Subclavian     Full        Yes        Yes                         +----------+------------+---------+-----------+----------+-------+  Left Findings: +----------+------------+---------+-----------+----------+-------+  LEFT       Compressible Phasicity Spontaneous Properties Summary  +----------+------------+---------+-----------+----------+-------+  IJV            Full        Yes        Yes                         +----------+------------+---------+-----------+----------+-------+  Subclavian      Full        Yes        Yes                         +----------+------------+---------+-----------+----------+-------+  Axillary       Full        Yes        Yes                         +----------+------------+---------+-----------+----------+-------+  Brachial       Full        Yes        Yes                         +----------+------------+---------+-----------+----------+-------+  Radial         Full                                               +----------+------------+---------+-----------+----------+-------+  Ulnar          Full                                               +----------+------------+---------+-----------+----------+-------+  Cephalic       Full                                               +----------+------------+---------+-----------+----------+-------+  Basilic        Full                                               +----------+------------+---------+-----------+----------+-------+  Summary:  Right: No evidence of thrombosis in the subclavian.  Left: No evidence of deep vein thrombosis in the upper extremity. No evidence of superficial vein thrombosis in the upper extremity.  *See table(s) above for measurements and observations.  Diagnosing physician: Harold Barban MD Electronically signed by Harold Barban MD on 01/19/2021 at 6:40:54 PM.    Final    VAS [place] UPPER EXTREMITY VENOUS DUPLEX  Result Date: 01/13/2021 UPPER VENOUS STUDY  Patient Name:  Jillian Leblanc  Date of Exam:   01/13/2021 Medical Rec #: [id number]       Accession #:    [id number] Date of Birth: 1945-06-01       Patient Gender: F Patient Age:   39 years Exam Location:  [facility] Procedure:      VAS [place] UPPER EXTREMITY VENOUS DUPLEX Referring Phys: CHING TU --------------------------------------------------------------------------------  Indications: Pain, and Swelling Risk Factors: Surgery 01-04-2021 Lumbar. Limitations: Multiple bandages, wounds, and patient pain tolerance. Comparison       01-01-2021 Prior  left upper extremity venous was negative for Study:           DVT. Performing Technologist: Darlin Coco RDMS, RVT  Examination Guidelines: A complete evaluation includes B-mode imaging, spectral Doppler, color Doppler, and power Doppler as needed of all accessible portions of each vessel. Bilateral testing is considered an integral part of a complete examination. Limited examinations for reoccurring indications may be performed as noted.  Right Findings: +----------+------------+---------+-----------+----------+-------+  RIGHT      Compressible Phasicity Spontaneous Properties Summary  +----------+------------+---------+-----------+----------+-------+  Subclavian  Yes        Yes                         +----------+------------+---------+-----------+----------+-------+  Left Findings: +----------+------------+---------+-----------+----------+---------------------+  LEFT       Compressible Phasicity Spontaneous Properties        Summary         +----------+------------+---------+-----------+----------+---------------------+  IJV            Full        Yes        Yes                                       +----------+------------+---------+-----------+----------+---------------------+  Subclavian                 Yes        Yes                                       +----------+------------+---------+-----------+----------+---------------------+  Axillary       Full        Yes        Yes                                       +----------+------------+---------+-----------+----------+---------------------+  Brachial       Full        Yes        Yes                  Some segments not                                                               well visualized due                                                             to bandaging and pain  +----------+------------+---------+-----------+----------+---------------------+  Radial                     Yes        Yes                 Unable to compress                                                                    due to pain       +----------+------------+---------+-----------+----------+---------------------+  Ulnar                      Yes  Yes                 Unable to compress                                                                   due to pain       +----------+------------+---------+-----------+----------+---------------------+  Cephalic       None        No         No                         Acute          +----------+------------+---------+-----------+----------+---------------------+  Basilic                    Yes        Yes                 Unable to compress                                                                   due to pain       +----------+------------+---------+-----------+----------+---------------------+  Summary:  Right: No evidence of thrombosis in the subclavian.  Left: No evidence of deep vein thrombosis in the upper extremity; however, portions of today's examination were limited. See technologist comments above. Findings consistent with acute superficial vein thrombosis involving the left cephalic vein at the level of the forearm.  *See table(s) above for measurements and observations.  Diagnosing physician: Orlie Pollen Electronically signed by Orlie Pollen on 01/13/2021 at 3:49:35 PM.    Final    VAS [place] UPPER EXTREMITY VENOUS DUPLEX  Result Date: 01/03/2021 UPPER VENOUS STUDY  Patient Name:  AJANAE VIRAG  Date of Exam:   01/01/2021 Medical Rec #: [id number]       Accession #:    [id number] Date of Birth: 08-05-1945       Patient Gender: F Patient Age:   63 years Exam Location:  [facility] Procedure:      VAS [place] UPPER EXTREMITY VENOUS DUPLEX Referring Phys: Oren Binet --------------------------------------------------------------------------------  Indications: Swelling Limitations: Bandages. Comparison Study: no prior Performing Technologist: Archie Patten RVS  Examination Guidelines: A complete  evaluation includes B-mode imaging, spectral Doppler, color Doppler, and power Doppler as needed of all accessible portions of each vessel. Bilateral testing is considered an integral part of a complete examination. Limited examinations for reoccurring indications may be performed as noted.  Right Findings: +----------+------------+---------+-----------+----------+-------+  RIGHT      Compressible Phasicity Spontaneous Properties Summary  +----------+------------+---------+-----------+----------+-------+  Subclavian                 Yes        Yes                         +----------+------------+---------+-----------+----------+-------+  Left Findings: +----------+------------+---------+-----------+----------+-------+  LEFT       Compressible Phasicity Spontaneous Properties Summary  +----------+------------+---------+-----------+----------+-------+  IJV  Full        Yes        Yes                         +----------+------------+---------+-----------+----------+-------+  Subclavian     Full        Yes        Yes                         +----------+------------+---------+-----------+----------+-------+  Axillary       Full        Yes        Yes                         +----------+------------+---------+-----------+----------+-------+  Brachial       Full        Yes        Yes                         +----------+------------+---------+-----------+----------+-------+  Radial         Full                                               +----------+------------+---------+-----------+----------+-------+  Ulnar          Full                                               +----------+------------+---------+-----------+----------+-------+  Cephalic       Full                                               +----------+------------+---------+-----------+----------+-------+  Summary:  Right: No evidence of thrombosis in the subclavian.  Left: No evidence of deep vein thrombosis in the upper extremity. No evidence of  superficial vein thrombosis in the upper extremity.  *See table(s) above for measurements and observations.  Diagnosing physician: Harold Barban MD Electronically signed by Harold Barban MD on 01/03/2021 at 7:41:50 PM.    Final    [place] EKG SITE RITE  Result Date: 01/07/2021 If Site Rite image not attached, placement could not be confirmed due to current cardiac rhythm.    Subjective: She is alert, denies pain ready to go home   Discharge Exam: Vitals:   01/22/21 0412 01/22/21 0933  BP: (!) 157/63 (!) 150/52  Pulse: 75 83  Resp: 20 18  Temp: 98.2 F (36.8 C) 98.3 F (36.8 C)  SpO2: 100% 99%     General: Pt is alert, awake, not in acute distress Cardiovascular: RRR, S1/S2 +, no rubs, no gallops Respiratory: CTA bilaterally, no wheezing, no rhonchi Abdominal: Soft, NT, ND, bowel sounds + Extremities: no edema, no cyanosis    The results of significant diagnostics from this hospitalization (including imaging, microbiology, ancillary and laboratory) are listed below for reference.     Microbiology: Recent Results (from the past 240 hour(s))  Resp Panel by RT-PCR (Flu A&B, Covid)     Status: None   Collection Time: 01/12/21  7:07  PM   Specimen: Nasopharyngeal(NP) swabs in vial transport medium  Result Value Ref Range Status   SARS Coronavirus 2 by RT PCR NEGATIVE NEGATIVE Final    Comment: (NOTE) SARS-CoV-2 target nucleic acids are NOT DETECTED.  The SARS-CoV-2 RNA is generally detectable in upper respiratory specimens during the acute phase of infection. The lowest concentration of SARS-CoV-2 viral copies this assay can detect is 138 copies/mL. A negative result does not preclude SARS-Cov-2 infection and should not be used as the sole basis for treatment or other patient management decisions. A negative result may occur with  improper specimen collection/handling, submission of specimen other than nasopharyngeal swab, presence of viral mutation(s) within the areas  targeted by this assay, and inadequate number of viral copies(<138 copies/mL). A negative result must be combined with clinical observations, patient history, and epidemiological information. The expected result is Negative.  Fact Sheet for Patients:  EntrepreneurPulse.com.au  Fact Sheet for Healthcare Providers:  IncredibleEmployment.be  This test is no t yet approved or cleared by the [place] FDA and  has been authorized for detection and/or diagnosis of SARS-CoV-2 by FDA under an Emergency Use Authorization (EUA). This EUA will remain  in effect (meaning this test can be used) for the duration of the COVID-19 declaration under Section 564(b)(1) of the Act, 21 U.S.C.section 360bbb-3(b)(1), unless the authorization is terminated  or revoked sooner.       Influenza A by PCR NEGATIVE NEGATIVE Final   Influenza B by PCR NEGATIVE NEGATIVE Final    Comment: (NOTE) The Xpert Xpress SARS-CoV-2/FLU/RSV plus assay is intended as an aid in the diagnosis of influenza from Nasopharyngeal swab specimens and should not be used as a sole basis for treatment. Nasal washings and aspirates are unacceptable for Xpert Xpress SARS-CoV-2/FLU/RSV testing.  Fact Sheet for Patients: EntrepreneurPulse.com.au  Fact Sheet for Healthcare Providers: IncredibleEmployment.be  This test is not yet approved or cleared by the [place] FDA and has been authorized for detection and/or diagnosis of SARS-CoV-2 by FDA under an Emergency Use Authorization (EUA). This EUA will remain in effect (meaning this test can be used) for the duration of the COVID-19 declaration under Section 564(b)(1) of the Act, 21 U.S.C. section 360bbb-3(b)(1), unless the authorization is terminated or revoked.  Performed at [facility], [place] [address] [place], [place] [address]   Blood Culture (routine x 2)     Status: None   Collection Time:  01/12/21  7:07 PM   Specimen: BLOOD  Result Value Ref Range Status   Specimen Description BLOOD LEFT ANTECUBITAL  Final   Special Requests   Final    BOTTLES DRAWN AEROBIC AND ANAEROBIC Blood Culture adequate volume   Culture   Final    NO GROWTH 5 DAYS Performed at [facility], [place] [address], [place], [place] [address]    Report Status 01/17/2021 FINAL  Final  Blood Culture (routine x 2)     Status: None   Collection Time: 01/13/21  8:08 AM   Specimen: BLOOD  Result Value Ref Range Status   Specimen Description BLOOD LEFT ANTECUBITAL  Final   Special Requests   Final    BOTTLES DRAWN AEROBIC AND ANAEROBIC Blood Culture adequate volume   Culture   Final    NO GROWTH 5 DAYS Performed at [facility], [place] [address] [place], [place] [address]    Report Status 01/18/2021 FINAL  Final     Labs: BNP (last 3 results) Recent Labs  12/19/20 0237  BNP 294.7*   Basic Metabolic Panel: Recent Labs  Lab 01/17/21 0326 01/18/21 0408 01/18/21 2100 01/19/21 0211 01/20/21 0325 01/21/21 0820  NA 132* 133* 132* 133* 132*  --   K 3.7 4.3 4.0 3.9 3.6  --   CL 104 105 105 105 104  --   CO2 23 22 20* 22 21*  --   GLUCOSE 130* 110* 136* 127* 120*  --   BUN 7* _0 --   CREATININE 0.59 0.74 0.61 0.60 0.61  --   CALCIUM 6.7* 6.8* 6.9* 6.9* 7.0*  --   MG 1.8 2.0 1.6* 1.6* 1.6* 1.8   Liver Function Tests: Recent Labs  Lab 01/16/21 0302 01/18/21 2100  AST 19 28  ALT 5 7  ALKPHOS 142* 183*  BILITOT 0.9 1.0  PROT 5.4* 5.9*  ALBUMIN 1.7* 1.7*   No results for input(s): LIPASE, AMYLASE in the last 168 hours. No results for input(s): AMMONIA in the last 168 hours. CBC: Recent Labs  Lab 01/18/21 0408 01/18/21 2100 01/19/21 0211 01/20/21 0325 01/21/21 0459  WBC 4.6 5.4 4.3 4.6 5.2  NEUTROABS  --  3.3  --   --   --   HGB 7.2* 7.6* 7.5* 7.8* 7.9*  HCT 22.9* 24.1* 23.9* 24.2* 24.6*  MCV 94.2 92.3 93.7 92.4 89.8  PLT 335 332 313 298 325   Cardiac  Enzymes: No results for input(s): CKTOTAL, CKMB, CKMBINDEX, TROPONINI in the last 168 hours. BNP: Invalid input(s): POCBNP CBG: Recent Labs  Lab 01/21/21 1125 01/21/21 1618 01/21/21 2055 01/22/21 0642 01/22/21 1141  GLUCAP 192* 104* 145* 114* 186*   D-Dimer No results for input(s): DDIMER in the last 72 hours. Hgb A1c No results for input(s): HGBA1C in the last 72 hours. Lipid Profile No results for input(s): CHOL, HDL, LDLCALC, TRIG, CHOLHDL, LDLDIRECT in the last 72 hours. Thyroid function studies No results for input(s): TSH, T4TOTAL, T3FREE, THYROIDAB in the last 72 hours.  Invalid input(s): FREET3 Anemia work up No results for input(s): VITAMINB12, FOLATE, FERRITIN, TIBC, IRON, RETICCTPCT in the last 72 hours. Urinalysis    Component Value Date/Time   COLORURINE YELLOW 01/12/2021 1907   APPEARANCEUR HAZY (A) 01/12/2021 1907   LABSPEC 1.014 01/12/2021 1907   PHURINE 6.0 01/12/2021 1907   GLUCOSEU 50 (A) 01/12/2021 1907   HGBUR SMALL (A) 01/12/2021 1907   BILIRUBINUR NEGATIVE 01/12/2021 1907   KETONESUR NEGATIVE 01/12/2021 1907   PROTEINUR 30 (A) 01/12/2021 1907   UROBILINOGEN 0.2 09/15/2012 1536   NITRITE POSITIVE (A) 01/12/2021 1907   LEUKOCYTESUR LARGE (A) 01/12/2021 1907   Sepsis Labs Invalid input(s): PROCALCITONIN,  WBC,  LACTICIDVEN Microbiology Recent Results (from the past 240 hour(s))  Resp Panel by RT-PCR (Flu A&B, Covid)     Status: None   Collection Time: 01/12/21  7:07 PM   Specimen: Nasopharyngeal(NP) swabs in vial transport medium  Result Value Ref Range Status   SARS Coronavirus 2 by RT PCR NEGATIVE NEGATIVE Final    Comment: (NOTE) SARS-CoV-2 target nucleic acids are NOT DETECTED.  The SARS-CoV-2 RNA is generally detectable in upper respiratory specimens during the acute phase of infection. The lowest concentration of SARS-CoV-2 viral copies this assay can detect is 138 copies/mL. A negative result does not preclude SARS-Cov-2 infection  and should not be used as the sole basis for treatment or other patient management decisions. A negative result may occur with  improper specimen collection/handling, submission of specimen other than nasopharyngeal swab,  presence of viral mutation(s) within the areas targeted by this assay, and inadequate number of viral copies(<138 copies/mL). A negative result must be combined with clinical observations, patient history, and epidemiological information. The expected result is Negative.  Fact Sheet for Patients:  EntrepreneurPulse.com.au  Fact Sheet for Healthcare Providers:  IncredibleEmployment.be  This test is no t yet approved or cleared by the [place] FDA and  has been authorized for detection and/or diagnosis of SARS-CoV-2 by FDA under an Emergency Use Authorization (EUA). This EUA will remain  in effect (meaning this test can be used) for the duration of the COVID-19 declaration under Section 564(b)(1) of the Act, 21 U.S.C.section 360bbb-3(b)(1), unless the authorization is terminated  or revoked sooner.       Influenza A by PCR NEGATIVE NEGATIVE Final   Influenza B by PCR NEGATIVE NEGATIVE Final    Comment: (NOTE) The Xpert Xpress SARS-CoV-2/FLU/RSV plus assay is intended as an aid in the diagnosis of influenza from Nasopharyngeal swab specimens and should not be used as a sole basis for treatment. Nasal washings and aspirates are unacceptable for Xpert Xpress SARS-CoV-2/FLU/RSV testing.  Fact Sheet for Patients: EntrepreneurPulse.com.au  Fact Sheet for Healthcare Providers: IncredibleEmployment.be  This test is not yet approved or cleared by the [place] FDA and has been authorized for detection and/or diagnosis of SARS-CoV-2 by FDA under an Emergency Use Authorization (EUA). This EUA will remain in effect (meaning this test can be used) for the duration of the COVID-19 declaration  under Section 564(b)(1) of the Act, 21 U.S.C. section 360bbb-3(b)(1), unless the authorization is terminated or revoked.  Performed at [facility], [place] [address], [place], [place] [address]   Blood Culture (routine x 2)     Status: None   Collection Time: 01/12/21  7:07 PM   Specimen: BLOOD  Result Value Ref Range Status   Specimen Description BLOOD LEFT ANTECUBITAL  Final   Special Requests   Final    BOTTLES DRAWN AEROBIC AND ANAEROBIC Blood Culture adequate volume   Culture   Final    NO GROWTH 5 DAYS Performed at [facility], [place] [address], [place], [place] [address]    Report Status 01/17/2021 FINAL  Final  Blood Culture (routine x 2)     Status: None   Collection Time: 01/13/21  8:08 AM   Specimen: BLOOD  Result Value Ref Range Status   Specimen Description BLOOD LEFT ANTECUBITAL  Final   Special Requests   Final    BOTTLES DRAWN AEROBIC AND ANAEROBIC Blood Culture adequate volume   Culture   Final    NO GROWTH 5 DAYS Performed at [facility], [place] [address], [place], [place] [address]    Report Status 01/18/2021 FINAL  Final     Time coordinating discharge: 40 minutes  SIGNED:   Elmarie Shiley, MD  [facility]

## 2021-01-22 NOTE — TOC Transition Note (Signed)
Transition of Care [facility]) - CM/SW Discharge Note   Patient Details  Name: Jillian Leblanc MRN: [id number] Date of Birth: [birth year]  [facility]) CM/SW Contact:  Tom-Paschen, Hershal Coria, RN Phone Number: 01/22/2021, 12:11 PM   Clinical Narrative:    [name] scheduled for discharge today. Trapeze, hospital bed, hoyer lift, and bedside commode delivered to patient's residence by [facility]. Cory with [facility] notified of discharge as well as Pam with [employer]. Daughter, [name] notified and is waiting for patient at home. PTAR scheduled for transportation. No further TOC needs noted.   Final next level of care: Home w Home Health Services Barriers to Discharge: Barriers Resolved   Patient Goals and CMS Choice Patient states their goals for this hospitalization and ongoing recovery are:: To go home CMS Medicare.gov Compare Post Acute Care list provided to:: Patient Choice offered to / list presented to : Patient, Adult Children [name])  Discharge Placement              Patient chooses bed at:  (Going home) Patient to be transferred to facility by: PTAR Name of family member notified: Daughter, [name] Patient and family notified of of transfer: 01/22/21  Discharge Plan and Services In-house Referral: Clinical Social Work Discharge Planning Services: CM Consult Post Acute Care Choice: Home Health          DME Arranged: Hospital bed DME Agency: [employer] Date DME Agency Contacted: 01/16/21 Time DME Agency Contacted: [phone] Representative spoke with at DME Agency: Leavy Cella HH Arranged: PT, OT HH Agency: [facility] Date [facility] Agency Contacted: 01/16/21 Time HH Agency Contacted: [phone] Representative spoke with at [facility] Agency: Kandee Keen  Social Determinants of Health (SDOH) Interventions     Readmission Risk Interventions No flowsheet data found.

## 2021-01-22 NOTE — Progress Notes (Signed)
Occupational Therapy Treatment Patient Details Name: Jillian Leblanc MRN: [id number] DOB: August 12, 1945 Today's Date: 01/22/2021   History of present illness Pt is a 75 y/o female admitted 12/20 from SNF for recent septic shock from [facility] bacteremia L4-S1 osteo with epidural abscess, s/p L45 lami and IV antibx.  Pt now admitted with AMS found to be due to UTI/Cauti. Ortho consulted for left shoulder dislocation noted during admission. 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 Not a candidate for surgery. PMHx:  DM2, HFpEF   OT comments  Patient seen today to address bed mobility and grooming seated on EOB. Patient had less complaints of pain and was able to get to EOB with max assist of 1. Patient was able to maintain sitting balance with min assist and performed grooming before stating that she had gotten tired and asked to return to supine. [name] expected to return home with HHOT.    Recommendations for follow up therapy are one component of a multi-disciplinary discharge planning process, led by the attending physician.  Recommendations may be updated based on patient status, additional functional criteria and insurance authorization.    Follow Up Recommendations  Home health OT    Assistance Recommended at Discharge Frequent or constant Supervision/Assistance  Equipment Recommendations  None recommended by OT    Recommendations for Other Services      Precautions / Restrictions Precautions Precautions: Fall;Back Precaution Booklet Issued: No Precaution Comments: Back for comfort Required Braces or Orthoses: Spinal Brace Spinal Brace: Thoracolumbosacral orthotic       Mobility Bed Mobility Overal bed mobility: Needs Assistance Bed Mobility: Sidelying to Sit;Sit to Supine   Sidelying to sit: Max assist   Sit to supine: Max assist   General bed mobility  comments: max assist to prevent back pain with bed mobilty    Transfers                   General transfer comment: did not perform     Balance Overall balance assessment: Needs assistance Sitting-balance support: Single extremity supported;Feet supported Sitting balance-Leahy Scale: Poor Sitting balance - Comments: min assist for sitting balance on EOB                                   ADL either performed or assessed with clinical judgement   ADL Overall ADL's : Needs assistance/impaired Eating/Feeding: Set up;Sitting Eating/Feeding Details (indicate cue type and reason): provided setup at bed level Grooming: Wash/dry hands;Wash/dry face;Oral care;Sitting;Minimal assistance Grooming Details (indicate cue type and reason): min assist for balance and assistance to perform grooming seated on EOB                               General ADL Comments: Patient was able to tolerate grooming seated on EOB and then asked to return to supine due to fatigue    Extremity/Trunk Assessment Upper Extremity Assessment RUE Deficits / Details: Pain and weakness with UE movment RUE Sensation: decreased light touch RUE Coordination: decreased gross motor;decreased fine motor            Vision       Perception     Praxis      Cognition Arousal/Alertness: Awake/alert Behavior During Therapy: Restless Overall Cognitive Status: Impaired/Different from baseline  Area of Impairment: Memory;Following commands;Safety/judgement;Awareness;Problem solving                 Orientation Level: Disoriented to;Time   Memory: Decreased short-term memory Following Commands: Follows one step commands with increased time;Follows multi-step commands inconsistently Safety/Judgement: Decreased awareness of safety;Decreased awareness of deficits Awareness: Intellectual Problem Solving: Slow processing;Decreased initiation;Difficulty sequencing;Requires verbal  cues;Requires tactile cues General Comments: recalled therapist from previous visit          Exercises     Shoulder Instructions       General Comments      Pertinent Vitals/ Pain       Pain Assessment: Faces Faces Pain Scale: Hurts little more Pain Location: back and LUE Pain Descriptors / Indicators: Grimacing;Guarding;Aching Pain Intervention(s): Limited activity within patient's tolerance;Monitored during session;Premedicated before session;Repositioned  Home Living                                          Prior Functioning/Environment              Frequency  Min 2X/week        Progress Toward Goals  OT Goals(current goals can now be found in the care plan section)  Progress towards OT goals: Progressing toward goals  Acute Rehab OT Goals Patient Stated Goal: get better OT Goal Formulation: With patient Time For Goal Achievement: 01/29/21 Potential to Achieve Goals: Good ADL Goals Pt Will Perform Grooming: with supervision;standing Pt Will Perform Lower Body Bathing: with min guard assist;sitting/lateral leans;sit to/from stand Pt Will Perform Lower Body Dressing: with min guard assist;sitting/lateral leans;sit to/from stand Pt Will Transfer to Toilet: with min assist;ambulating Pt Will Perform Toileting - Clothing Manipulation and hygiene: with min assist;sitting/lateral leans;sit to/from stand Pt/caregiver will Perform Home Exercise Program: Increased strength;Both right and left upper extremity;With theraband;Independently;With written HEP provided Additional ADL Goal #1: Pt will recall 3/3 spinal precautions. Additional ADL Goal #2: Pt to increase standing activity tolerance > 10 min during ADLs/functional mobility to improve overall endurance  Plan Discharge plan remains appropriate    Co-evaluation                 AM-PAC OT "6 Clicks" Daily Activity     Outcome Measure   Help from another person eating meals?: A  Little Help from another person taking care of personal grooming?: A Little Help from another person toileting, which includes using toliet, bedpan, or urinal?: A Lot Help from another person bathing (including washing, rinsing, drying)?: A Lot Help from another person to put on and taking off regular upper body clothing?: A Little Help from another person to put on and taking off regular lower body clothing?: A Lot 6 Click Score: 15    End of Session    OT Visit Diagnosis: Unsteadiness on feet (R26.81);Muscle weakness (generalized) (M62.81);Repeated falls (R29.6)   Activity Tolerance Patient limited by fatigue   Patient Left in bed;with call bell/phone within reach;with bed alarm set   Nurse Communication Mobility status        Time: 6283-1517 OT Time Calculation (min): 22 min  Charges: OT General Charges $OT Visit: 1 Visit OT Treatments $Self Care/Home Management : 8-22 mins  Alfonse Flavors, OTA Acute Rehabilitation Services  Pager [phone] Office [phone]   Dewain Penning 01/22/2021, 12:55 PM

## 2021-01-26 ENCOUNTER — Telehealth: Payer: Self-pay

## 2021-01-26 NOTE — Telephone Encounter (Signed)
There were 21 NSVT arrhythmias detected.  There were 10 fast A&V episodes.  There was one AF episode that was 56 minutes and had fast ventricular rates, AF burden is 3.5% of the time, No OAC on file. sent to triage.   Patient was seen in the hospital during this time and no OAC was prescribed that I see. Unable to find note for contraindication. Wanted to send to make you aware.

## 2021-01-27 ENCOUNTER — Emergency Department (HOSPITAL_COMMUNITY): Payer: Medicare Other

## 2021-01-27 ENCOUNTER — Inpatient Hospital Stay (HOSPITAL_COMMUNITY)
Admission: EM | Admit: 2021-01-27 | Discharge: 2021-01-29 | DRG: 640 | Disposition: A | Discharge status: Home Health | Payer: Medicare Other | Attending: Family Medicine | Admitting: Family Medicine

## 2021-01-27 ENCOUNTER — Encounter (HOSPITAL_COMMUNITY): Payer: Self-pay

## 2021-01-27 ENCOUNTER — Other Ambulatory Visit: Payer: Self-pay

## 2021-01-27 DIAGNOSIS — Z79899 Other long term (current) drug therapy: Secondary | ICD-10-CM

## 2021-01-27 DIAGNOSIS — Z950 Presence of cardiac pacemaker: Secondary | ICD-10-CM

## 2021-01-27 DIAGNOSIS — E8809 Other disorders of plasma-protein metabolism, not elsewhere classified: Secondary | ICD-10-CM | POA: Diagnosis present

## 2021-01-27 DIAGNOSIS — D638 Anemia in other chronic diseases classified elsewhere: Secondary | ICD-10-CM | POA: Diagnosis present

## 2021-01-27 DIAGNOSIS — Z6834 Body mass index (BMI) 34.0-34.9, adult: Secondary | ICD-10-CM

## 2021-01-27 DIAGNOSIS — I495 Sick sinus syndrome: Secondary | ICD-10-CM | POA: Diagnosis present

## 2021-01-27 DIAGNOSIS — E669 Obesity, unspecified: Secondary | ICD-10-CM | POA: Diagnosis present

## 2021-01-27 DIAGNOSIS — I9589 Other hypotension: Secondary | ICD-10-CM | POA: Diagnosis present

## 2021-01-27 DIAGNOSIS — Z794 Long term (current) use of insulin: Secondary | ICD-10-CM

## 2021-01-27 DIAGNOSIS — E860 Dehydration: Secondary | ICD-10-CM | POA: Diagnosis not present

## 2021-01-27 DIAGNOSIS — E1169 Type 2 diabetes mellitus with other specified complication: Secondary | ICD-10-CM | POA: Diagnosis present

## 2021-01-27 DIAGNOSIS — E871 Hypo-osmolality and hyponatremia: Secondary | ICD-10-CM | POA: Diagnosis present

## 2021-01-27 DIAGNOSIS — G9341 Metabolic encephalopathy: Secondary | ICD-10-CM | POA: Diagnosis present

## 2021-01-27 DIAGNOSIS — Z79890 Hormone replacement therapy (postmenopausal): Secondary | ICD-10-CM

## 2021-01-27 DIAGNOSIS — E861 Hypovolemia: Secondary | ICD-10-CM | POA: Diagnosis present

## 2021-01-27 DIAGNOSIS — E11649 Type 2 diabetes mellitus with hypoglycemia without coma: Secondary | ICD-10-CM | POA: Diagnosis present

## 2021-01-27 DIAGNOSIS — A419 Sepsis, unspecified organism: Secondary | ICD-10-CM | POA: Diagnosis present

## 2021-01-27 DIAGNOSIS — R7881 Bacteremia: Secondary | ICD-10-CM | POA: Diagnosis present

## 2021-01-27 DIAGNOSIS — I959 Hypotension, unspecified: Secondary | ICD-10-CM | POA: Diagnosis present

## 2021-01-27 DIAGNOSIS — M4626 Osteomyelitis of vertebra, lumbar region: Secondary | ICD-10-CM | POA: Diagnosis present

## 2021-01-27 DIAGNOSIS — B9620 Unspecified Escherichia coli [E. coli] as the cause of diseases classified elsewhere: Secondary | ICD-10-CM | POA: Diagnosis present

## 2021-01-27 DIAGNOSIS — R410 Disorientation, unspecified: Secondary | ICD-10-CM

## 2021-01-27 DIAGNOSIS — R339 Retention of urine, unspecified: Secondary | ICD-10-CM | POA: Diagnosis present

## 2021-01-27 DIAGNOSIS — J9611 Chronic respiratory failure with hypoxia: Secondary | ICD-10-CM | POA: Diagnosis present

## 2021-01-27 DIAGNOSIS — I5032 Chronic diastolic (congestive) heart failure: Secondary | ICD-10-CM | POA: Diagnosis present

## 2021-01-27 DIAGNOSIS — Z9981 Dependence on supplemental oxygen: Secondary | ICD-10-CM

## 2021-01-27 DIAGNOSIS — E039 Hypothyroidism, unspecified: Secondary | ICD-10-CM | POA: Diagnosis present

## 2021-01-27 DIAGNOSIS — Z978 Presence of other specified devices: Secondary | ICD-10-CM

## 2021-01-27 DIAGNOSIS — G8929 Other chronic pain: Secondary | ICD-10-CM | POA: Diagnosis present

## 2021-01-27 DIAGNOSIS — Z20822 Contact with and (suspected) exposure to covid-19: Secondary | ICD-10-CM | POA: Diagnosis present

## 2021-01-27 DIAGNOSIS — Z90710 Acquired absence of both cervix and uterus: Secondary | ICD-10-CM

## 2021-01-27 LAB — CBC WITH DIFFERENTIAL/PLATELET
Abs Immature Granulocytes: 0.06 10*3/uL (ref 0.00–0.07)
Basophils Absolute: 0.1 10*3/uL (ref 0.0–0.1)
Basophils Relative: 1 %
Eosinophils Absolute: 0.1 10*3/uL (ref 0.0–0.5)
Eosinophils Relative: 1 %
HCT: 26.5 % — ABNORMAL LOW (ref 36.0–46.0)
Hemoglobin: 8.3 g/dL — ABNORMAL LOW (ref 12.0–15.0)
Immature Granulocytes: 1 %
Lymphocytes Relative: 30 %
Lymphs Abs: 2 10*3/uL (ref 0.7–4.0)
MCH: 29.3 pg (ref 26.0–34.0)
MCHC: 31.3 g/dL (ref 30.0–36.0)
MCV: 93.6 fL (ref 80.0–100.0)
Monocytes Absolute: 0.5 10*3/uL (ref 0.1–1.0)
Monocytes Relative: 8 %
Neutro Abs: 4 10*3/uL (ref 1.7–7.7)
Neutrophils Relative %: 59 %
Platelets: 303 10*3/uL (ref 150–400)
RBC: 2.83 MIL/uL — ABNORMAL LOW (ref 3.87–5.11)
RDW: 17.6 % — ABNORMAL HIGH (ref 11.5–15.5)
WBC: 6.7 10*3/uL (ref 4.0–10.5)
nRBC: 0 % (ref 0.0–0.2)

## 2021-01-27 LAB — URINALYSIS, ROUTINE W REFLEX MICROSCOPIC
Bilirubin Urine: NEGATIVE
Glucose, UA: >=500 mg/dL — AB
Ketones, ur: NEGATIVE mg/dL
Nitrite: NEGATIVE
Protein, ur: NEGATIVE mg/dL
Specific Gravity, Urine: 1 — ABNORMAL LOW (ref 1.005–1.030)
pH: 7 (ref 5.0–8.0)

## 2021-01-27 LAB — COMPREHENSIVE METABOLIC PANEL
ALT: 5 U/L (ref 0–44)
AST: 20 U/L (ref 15–41)
Albumin: 1.6 g/dL — ABNORMAL LOW (ref 3.5–5.0)
Alkaline Phosphatase: 217 U/L — ABNORMAL HIGH (ref 38–126)
Anion gap: 7 (ref 5–15)
BUN: 8 mg/dL (ref 8–23)
CO2: 21 mmol/L — ABNORMAL LOW (ref 22–32)
Calcium: 6.4 mg/dL — CL (ref 8.9–10.3)
Chloride: 99 mmol/L (ref 98–111)
Creatinine, Ser: 0.69 mg/dL (ref 0.44–1.00)
GFR, Estimated: >60 mL/min (ref >60)
Glucose, Bld: 130 mg/dL — ABNORMAL HIGH (ref 70–99)
Potassium: 4.2 mmol/L (ref 3.5–5.1)
Sodium: 127 mmol/L — ABNORMAL LOW (ref 135–145)
Total Bilirubin: 0.6 mg/dL (ref 0.3–1.2)
Total Protein: 5.9 g/dL — ABNORMAL LOW (ref 6.5–8.1)

## 2021-01-27 LAB — LACTIC ACID, PLASMA
Lactic Acid, Venous: 1.3 mmol/L (ref 0.5–1.9)
Lactic Acid, Venous: 0.9 mmol/L (ref 0.5–1.9)

## 2021-01-27 MED ORDER — ALBUMIN HUMAN 25 % IV SOLN
12.5000 g | Freq: Once | INTRAVENOUS | Status: AC
Start: 2021-01-27 — End: 2021-01-28
  Administered 2021-01-27: 12.5 g via INTRAVENOUS
  Filled 2021-01-27: qty 50

## 2021-01-27 MED ORDER — SODIUM CHLORIDE 0.9 % IV BOLUS
500.0000 mL | Freq: Once | INTRAVENOUS | Status: AC
  Administered 2021-01-27: 500 mL via INTRAVENOUS

## 2021-01-27 MED ORDER — CEFAZOLIN SODIUM-DEXTROSE 2-4 GM/100ML-% IV SOLN
2.0000 g | Freq: Three times a day (TID) | INTRAVENOUS | Status: DC
  Administered 2021-01-27: 2 g via INTRAVENOUS
  Filled 2021-01-27: qty 100

## 2021-01-27 MED ORDER — SODIUM CHLORIDE 0.9 % IV SOLN
2.0000 g | Freq: Two times a day (BID) | INTRAVENOUS | Status: DC
  Administered 2021-01-28 – 2021-01-29 (×4): 2 g via INTRAVENOUS
  Filled 2021-01-27 (×4): qty 2

## 2021-01-27 MED ORDER — ONDANSETRON HCL 4 MG/2ML IJ SOLN: 4.0000 mg | Freq: Four times a day (QID) | INTRAMUSCULAR | Status: DC | PRN

## 2021-01-27 MED ORDER — ONDANSETRON HCL 4 MG PO TABS: 4.0000 mg | ORAL_TABLET | Freq: Four times a day (QID) | ORAL | Status: DC | PRN

## 2021-01-27 MED ORDER — CALCIUM GLUCONATE-NACL 1-0.675 GM/50ML-% IV SOLN
1.0000 g | Freq: Once | INTRAVENOUS | Status: AC
  Administered 2021-01-27: 1000 mg via INTRAVENOUS
  Filled 2021-01-27: qty 50

## 2021-01-27 MED ORDER — ENOXAPARIN SODIUM 40 MG/0.4ML IJ SOSY
40.0000 mg | PREFILLED_SYRINGE | INTRAMUSCULAR | Status: DC
  Administered 2021-01-28 – 2021-01-29 (×2): 40 mg via SUBCUTANEOUS
  Filled 2021-01-27 (×2): qty 0.4

## 2021-01-27 MED ORDER — ALBUMIN HUMAN 5 % IV SOLN
25.0000 g | Freq: Once | INTRAVENOUS | Status: AC
Start: 2021-01-27 — End: 2021-01-28
  Administered 2021-01-28: 25 g via INTRAVENOUS
  Filled 2021-01-27: qty 500

## 2021-01-27 MED ORDER — SODIUM CHLORIDE 0.9 % IV SOLN: INTRAVENOUS | Status: DC

## 2021-01-27 MED ORDER — ACETAMINOPHEN 650 MG RE SUPP: 650.0000 mg | Freq: Four times a day (QID) | RECTAL | Status: DC | PRN

## 2021-01-27 MED ORDER — ACETAMINOPHEN 325 MG PO TABS
650.0000 mg | ORAL_TABLET | Freq: Four times a day (QID) | ORAL | Status: DC | PRN
  Administered 2021-01-28 (×2): 650 mg via ORAL
  Filled 2021-01-27 (×2): qty 2

## 2021-01-27 MED ORDER — SODIUM CHLORIDE 0.9 % IV BOLUS: 1000.0000 mL | Freq: Once | INTRAVENOUS | Status: DC

## 2021-01-27 NOTE — ED Provider Notes (Signed)
Emergency Department Provider Note   I have reviewed the triage vital signs and the nursing notes.   HISTORY  Chief Complaint Post-op Problem and Back Pain   HPI Jillian Leblanc is a 76 y.o. 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  She was admitted recently for urinary tract infection with chronic indwelling Foley.  She developed sepsis at that time and completed a course of cefepime.  She is to continue the Ancef until early February.  In speaking with the patient's daughter, she seemed more confused today than normal. She was more agitated and getting confused with who she was talking to. They developed some concern for return of infection.  They also note that the patient's Ancef, which is delivered to the house and administered by PICC line, did not come today in the mail.  They called to coordinate an additional delivery but had not yet arrived.  With the confusion they became concerned and sent her to the emergency department for antibiotics and further evaluation.  The patient tells me that she is feeling "fine" with some chronic back pain but nothing worse than normal.  She has noticed some drainage from the wound which also described as chronic.   Past Medical History:  Diagnosis Date   CHF (congestive heart failure) (HCC)    Chronic back pain    Diabetes mellitus without complication (HCC)    Dysrhythmia    Hypothyroidism    Interstitial cystitis    Presence of permanent cardiac pacemaker    Thyroid disease     Review of Systems  Constitutional: No fever/chills Eyes: No visual changes. ENT: No sore throat. Cardiovascular: Denies chest pain. Respiratory: Denies shortness of breath. Gastrointestinal: No abdominal pain.  No nausea, no vomiting.  No diarrhea.  No  constipation. Genitourinary: Negative for dysuria. Musculoskeletal: Positive for back pain. Skin: Negative for rash. Neurological: Negative for headaches, focal weakness or numbness.  10-point ROS otherwise negative.  ____________________________________________   PHYSICAL EXAM:  VITAL SIGNS: ED Triage Vitals  Enc Vitals Group     BP 01/27/21 1744 (!) 79/47     Pulse Rate 01/27/21 1744 80     Resp 01/27/21 1744 16     Temp 01/27/21 1744 98.8 F (37.1 C)     Temp Source 01/27/21 1744 Oral     SpO2 01/27/21 1744 95 %     Weight 01/27/21 1740 178 lb (80.7 kg)     Height 01/27/21 1740 5' (1.524 m)    Constitutional: Alert. Chronically ill appearing.  Eyes: Conjunctivae are normal.  Head: Atraumatic. Nose: No congestion/rhinnorhea. Mouth/Throat: Mucous membranes are dry.   Neck: No stridor.   Cardiovascular: Normal rate, regular rhythm. Good peripheral circulation. Grossly normal heart sounds. Well appearing PICC line in the right arm.  Respiratory: Normal respiratory effort.  No retractions. Lungs CTAB. Gastrointestinal: Soft and nontender. No distention.  Musculoskeletal: No lower extremity tenderness nor edema. No gross deformities of extremities. Neurologic:  Normal speech and language. No gross focal neurologic deficits are appreciated.  Skin:  Skin is warm and dry. Lumbar spine incision without cellulitis or purulent drainage. Small amount of serosanguinous drainage noted.    ____________________________________________   LABS (all labs ordered are listed, but only abnormal results are displayed)  Labs Reviewed  COMPREHENSIVE METABOLIC PANEL - Abnormal; Notable for the following components:  Result Value   Sodium 127 (*)    CO2 21 (*)    Glucose, Bld 130 (*)    Calcium 6.4 (*)    Total Protein 5.9 (*)    Albumin 1.6 (*)    Alkaline Phosphatase 217 (*)    All other components within normal limits  CBC WITH DIFFERENTIAL/PLATELET - Abnormal; Notable for the  following components:   RBC 2.83 (*)    Hemoglobin 8.3 (*)    HCT 26.5 (*)    RDW 17.6 (*)    All other components within normal limits  URINALYSIS, ROUTINE W REFLEX MICROSCOPIC - Abnormal; Notable for the following components:   APPearance CLOUDY (*)    Specific Gravity, Urine 1.000 (*)    Glucose, UA >=500 (*)    Hgb urine dipstick SMALL (*)    Leukocytes,Ua LARGE (*)    Bacteria, UA RARE (*)    All other components within normal limits  CULTURE, BLOOD (ROUTINE X 2)  CULTURE, BLOOD (ROUTINE X 2)  URINE CULTURE  LACTIC ACID, PLASMA  LACTIC ACID, PLASMA   ____________________________________________  EKG   EKG Interpretation  Date/Time:  Wednesday January 27 2021 23:23:01 EST Ventricular Rate:  73 PR Interval:  188 QRS Duration: 101 QT Interval:  459 QTC Calculation: 463 R Axis:   -36 Text Interpretation: Sinus rhythm Supraventricular bigeminy Left axis deviation Low voltage, precordial leads Consider anterior infarct Confirmed by Alona Bene [phone]) on 01/27/2021 11:38:07 PM        ____________________________________________  RADIOLOGY  CT Head Wo Contrast  Result Date: 01/27/2021 CLINICAL DATA:  Mental status change, unknown cause EXAM: CT HEAD WITHOUT CONTRAST TECHNIQUE: Contiguous axial images were obtained from the base of the skull through the vertex without intravenous contrast. COMPARISON:  CT head 11/26/2020 FINDINGS: Brain: No evidence of large-territorial acute infarction. No parenchymal hemorrhage. No mass lesion. No extra-axial collection. No mass effect or midline shift. No hydrocephalus. Basilar cisterns are patent. Vascular: No hyperdense vessel. Skull: No acute fracture or focal lesion. Sinuses/Orbits: Paranasal sinuses and mastoid air cells are clear. The orbits are unremarkable. Other: None. IMPRESSION: No acute intracranial abnormality. Electronically Signed   By: Tish Frederickson M.D.   On: 01/27/2021 21:26   DG Chest Portable 1 View  Result Date:  01/27/2021 CLINICAL DATA:  AMS EXAM: PORTABLE CHEST 1 VIEW COMPARISON:  None. FINDINGS: The heart and mediastinal contours are unchanged. Aortic calcification. Left chest wall 2 lead pacemaker in similar position. Low lung volumes. No focal consolidation. No pulmonary edema. No pleural effusion. No pneumothorax. No acute osseous abnormality. Reversed total right shoulder arthroplasty. IMPRESSION: Low lung volumes with no active disease. Electronically Signed   By: Tish Frederickson M.D.   On: 01/27/2021 19:18    ____________________________________________   PROCEDURES  Procedure(s) performed:   Procedures  CRITICAL CARE Performed by: Maia Plan Total critical care time: 35 minutes Critical care time was exclusive of separately billable procedures and treating other patients. Critical care was necessary to treat or prevent imminent or life-threatening deterioration. Critical care was time spent personally by me on the following activities: development of treatment plan with patient and/or surrogate as well as nursing, discussions with consultants, evaluation of patient's response to treatment, examination of patient, obtaining history from patient or surrogate, ordering and performing treatments and interventions, ordering and review of laboratory studies, ordering and review of radiographic studies, pulse oximetry and re-evaluation of patient's condition.  Alona Bene, MD Emergency Medicine  ____________________________________________   INITIAL IMPRESSION /  ASSESSMENT AND PLAN / ED COURSE  Pertinent labs & imaging results that were available during my care of the patient were reviewed by me and considered in my medical decision making (see chart for details).   This patient is Presenting for Evaluation of AMS and hypotension, which does require a range of treatment options, and is a complaint that involves a high risk of morbidity and mortality.  The Differential Diagnoses include  sepsis, hypovolemia, malnutrition, dehydration, UTI, metabolic encephalopathy.  Medical Decision Making: Summary:  Patient presents to the emergency department for evaluation of altered mental status worsening today.  Patient out of her home antibiotics due to pharmacy not delivering the medication.  She arrives hypotensive in the 70s but response to fluids with systolic pressures in the 90-100 range.  In review of prior admit/discharge summary as her pressures typically are high rather than low.  She is afebrile.  She appears deconditioned and dehydrated clinically.  Her sacral wound appears normal with some serosanguineous drainage but no cellulitis or purulent drainage to suspect deeper infection.  She has known chronic osteomyelitis and is currently undergoing treatment.  Do not plan on advanced imaging for now.  Patient has a chronic indwelling Foley which has particulate in the catheter as well as collecting bag.  Recent Pseudomonas infection with sepsis for UTI.  No focal neurologic deficit to strongly suspect stroke or ICH.     Reevaluation with update and discussion with patient.  She continues to respond to fluids.  Ordered her Ancef every 8 hours per her home regimen.    Critical Interventions- IVF, abx, and lab/imaging; to evaluate  Chief Complaint  Patient presents with   Post-op Problem   Back Pain    and assess for illness characterized as    After These Interventions, the Patient was reevaluated and was found question of a developing UTI.  Have sent this for culture.  Patient has improved blood pressures with IV fluids although remains soft compared to her baseline.  Consult complete with  Discussed patient's case with TRHto request admission. Patient and family (if present) updated with plan. [facility].  I reviewed all nursing notes, vitals, pertinent old records, EKGs, labs, imaging (as available).       I did Additional Historical Information  from daughter, as the patient is altered.  I decided to review pertinent External Data, and in summary patient with multiple admissions in the past 60 days for various sources of infection.     Clinical Laboratory Tests Ordered, included sepsis work-up including labs, blood cultures, troponin, EKG as interpreted by me as above.  Patient with no leukocytosis and normal lactic acid.  Equivocal UA which was sent for culture along with blood cultures.  Radiologic Tests Ordered, included CT imaging of the head along with portable chest x-ray.  These were independently viewed by me.  Reviewed the radiology interpretation as well.  No acute intracranial hemorrhage or other obvious acute process.  No evidence of pneumonia or pulmonary edema on chest x-ray.  Cardiac Monitor Tracing which shows irregular, narrow complex rhythm. Appears sinus.      ____________________________________________  FINAL CLINICAL IMPRESSION(S) / ED DIAGNOSES  Final diagnoses:  Hypotension due to hypovolemia  Disorientation     MEDICATIONS GIVEN DURING THIS VISIT:  Medications  ceFAZolin (ANCEF) IVPB 2g/100 mL premix (0 g Intravenous Stopped 01/27/21 2242)  calcium gluconate 1 g/ 50 mL sodium chloride IVPB (1,000 mg Intravenous New Bag/Given 01/27/21 2206)  albumin human 25 %  solution 12.5 g (has no administration in time range)  sodium chloride 0.9 % bolus 1,000 mL (has no administration in time range)  sodium chloride 0.9 % bolus 500 mL (0 mLs Intravenous Stopped 01/27/21 2141)     Note:  This document was prepared using Dragon voice recognition software and may include unintentional dictation errors.  Alona Bene, MD, [facility] Emergency Medicine    Saud Bail, Arlyss Repress, MD 01/27/21 [phone]

## 2021-01-27 NOTE — Telephone Encounter (Signed)
Spoke with patient's Daughter Jillian Leblanc to set up appt for the patient.The daughter wanted to know how soon the appointment would need to happen.   The patient was recently hospitalized and is now bedridden. The daughter is not able to bring her to the office for a visit.   If the office would like to do a Virtual Visit , the daughter would have to be present to do so. The Daughter states that her work is busy and it would be hard for her to do a virtual visit, but she will do the best she can to make the visit happen.   Please determine what the Daughter needs to do in regards to an appointment for the patient

## 2021-01-27 NOTE — Progress Notes (Signed)
Pharmacy Antibiotic Note  Jillian Leblanc is a 77 y.o. female admitted on 01/27/2021 recent admission for CAUTI (pan sens pseudomonas UCx) and epidural abscess on cefazolin PTA.  Pharmacy has been consulted for cefepime dosing.  Plan: Cefepime 2g q 12h Monitor renal function, Cx and clinical progression to narrow  Height: 5' (152.4 cm) Weight: 80.7 kg (178 lb) IBW/kg (Calculated) : 45.5  Temp (24hrs), Avg:98.6 F (37 C), Min:98.3 F (36.8 C), Max:98.8 F (37.1 C)  Recent Labs  Lab 01/21/21 0459 01/27/21 1855 01/27/21 2212  WBC 5.2 6.7  --   CREATININE  --  0.69  --   LATICACIDVEN  --  1.3 0.9    Estimated Creatinine Clearance: 58.9 mL/min (by C-G formula based on SCr of 0.69 mg/dL).    Allergies  Allergen Reactions   Pentosan Polysulfate Sodium Other (See Comments)    "[facility] hair fell out 10/21/2009 12:00:00 AM by Johny Blamer CNA 1"   Elmiron [Pentosan Polysulfate] Other (See Comments)    "Hair fell out"   Naloxone Other (See Comments)    Hallucinations/Confusion/Nightmares    Daylene Posey, PharmD Clinical Pharmacist ED Pharmacist Phone # [phone] 01/27/2021 11:20 PM

## 2021-01-27 NOTE — ED Notes (Signed)
ED Provider at bedside. 

## 2021-01-27 NOTE — ED Notes (Signed)
Patient reports daughter called EMS concerned because patient only received 1/3 antibiotics to day that she receives IV at home for sepsis tx.

## 2021-01-27 NOTE — ED Triage Notes (Signed)
Recent back surgery dx with sepsis has PICC line only one dose of antibiotics and none today but there was a mix up with rx and has no more antibiotics.

## 2021-01-28 ENCOUNTER — Encounter (HOSPITAL_COMMUNITY): Payer: Self-pay | Admitting: Internal Medicine

## 2021-01-28 DIAGNOSIS — R7881 Bacteremia: Secondary | ICD-10-CM

## 2021-01-28 DIAGNOSIS — E11649 Type 2 diabetes mellitus with hypoglycemia without coma: Secondary | ICD-10-CM | POA: Diagnosis present

## 2021-01-28 DIAGNOSIS — E8809 Other disorders of plasma-protein metabolism, not elsewhere classified: Secondary | ICD-10-CM | POA: Diagnosis present

## 2021-01-28 DIAGNOSIS — Z950 Presence of cardiac pacemaker: Secondary | ICD-10-CM | POA: Diagnosis not present

## 2021-01-28 DIAGNOSIS — N390 Urinary tract infection, site not specified: Secondary | ICD-10-CM

## 2021-01-28 DIAGNOSIS — I495 Sick sinus syndrome: Secondary | ICD-10-CM | POA: Diagnosis present

## 2021-01-28 DIAGNOSIS — G9341 Metabolic encephalopathy: Secondary | ICD-10-CM

## 2021-01-28 DIAGNOSIS — E861 Hypovolemia: Secondary | ICD-10-CM | POA: Diagnosis present

## 2021-01-28 DIAGNOSIS — Z978 Presence of other specified devices: Secondary | ICD-10-CM | POA: Diagnosis not present

## 2021-01-28 DIAGNOSIS — E1169 Type 2 diabetes mellitus with other specified complication: Secondary | ICD-10-CM

## 2021-01-28 DIAGNOSIS — R339 Retention of urine, unspecified: Secondary | ICD-10-CM | POA: Diagnosis present

## 2021-01-28 DIAGNOSIS — Z9981 Dependence on supplemental oxygen: Secondary | ICD-10-CM | POA: Diagnosis not present

## 2021-01-28 DIAGNOSIS — G8929 Other chronic pain: Secondary | ICD-10-CM | POA: Diagnosis present

## 2021-01-28 DIAGNOSIS — Z6834 Body mass index (BMI) 34.0-34.9, adult: Secondary | ICD-10-CM | POA: Diagnosis not present

## 2021-01-28 DIAGNOSIS — B9620 Unspecified Escherichia coli [E. coli] as the cause of diseases classified elsewhere: Secondary | ICD-10-CM | POA: Diagnosis present

## 2021-01-28 DIAGNOSIS — E669 Obesity, unspecified: Secondary | ICD-10-CM

## 2021-01-28 DIAGNOSIS — J9611 Chronic respiratory failure with hypoxia: Secondary | ICD-10-CM | POA: Diagnosis present

## 2021-01-28 DIAGNOSIS — E871 Hypo-osmolality and hyponatremia: Secondary | ICD-10-CM

## 2021-01-28 DIAGNOSIS — I9589 Other hypotension: Secondary | ICD-10-CM | POA: Diagnosis present

## 2021-01-28 DIAGNOSIS — A419 Sepsis, unspecified organism: Secondary | ICD-10-CM

## 2021-01-28 DIAGNOSIS — D638 Anemia in other chronic diseases classified elsewhere: Secondary | ICD-10-CM

## 2021-01-28 DIAGNOSIS — E039 Hypothyroidism, unspecified: Secondary | ICD-10-CM | POA: Diagnosis present

## 2021-01-28 DIAGNOSIS — Z90710 Acquired absence of both cervix and uterus: Secondary | ICD-10-CM | POA: Diagnosis not present

## 2021-01-28 DIAGNOSIS — I5032 Chronic diastolic (congestive) heart failure: Secondary | ICD-10-CM | POA: Diagnosis present

## 2021-01-28 DIAGNOSIS — M4626 Osteomyelitis of vertebra, lumbar region: Secondary | ICD-10-CM | POA: Diagnosis present

## 2021-01-28 DIAGNOSIS — Z20822 Contact with and (suspected) exposure to covid-19: Secondary | ICD-10-CM | POA: Diagnosis present

## 2021-01-28 DIAGNOSIS — E860 Dehydration: Secondary | ICD-10-CM | POA: Diagnosis present

## 2021-01-28 DIAGNOSIS — Z79890 Hormone replacement therapy (postmenopausal): Secondary | ICD-10-CM | POA: Diagnosis not present

## 2021-01-28 DIAGNOSIS — R410 Disorientation, unspecified: Secondary | ICD-10-CM | POA: Diagnosis present

## 2021-01-28 LAB — RESP PANEL BY RT-PCR (FLU A&B, COVID) ARPGX2
Influenza A by PCR: NEGATIVE
Influenza B by PCR: NEGATIVE
SARS Coronavirus 2 by RT PCR: NEGATIVE

## 2021-01-28 LAB — CBG MONITORING, ED
Glucose-Capillary: 90 mg/dL (ref 70–99)
Glucose-Capillary: 101 mg/dL — ABNORMAL HIGH (ref 70–99)
Glucose-Capillary: 69 mg/dL — ABNORMAL LOW (ref 70–99)
Glucose-Capillary: 185 mg/dL — ABNORMAL HIGH (ref 70–99)
Glucose-Capillary: 61 mg/dL — ABNORMAL LOW (ref 70–99)

## 2021-01-28 LAB — CBC
HCT: 25 % — ABNORMAL LOW (ref 36.0–46.0)
Hemoglobin: 7.7 g/dL — ABNORMAL LOW (ref 12.0–15.0)
MCH: 29.6 pg (ref 26.0–34.0)
MCHC: 30.8 g/dL (ref 30.0–36.0)
MCV: 96.2 fL (ref 80.0–100.0)
Platelets: 248 10*3/uL (ref 150–400)
RBC: 2.6 MIL/uL — ABNORMAL LOW (ref 3.87–5.11)
RDW: 17.9 % — ABNORMAL HIGH (ref 11.5–15.5)
WBC: 3.8 10*3/uL — ABNORMAL LOW (ref 4.0–10.5)
nRBC: 0 % (ref 0.0–0.2)

## 2021-01-28 LAB — COMPREHENSIVE METABOLIC PANEL
ALT: 6 U/L (ref 0–44)
AST: 14 U/L — ABNORMAL LOW (ref 15–41)
Albumin: 2.2 g/dL — ABNORMAL LOW (ref 3.5–5.0)
Alkaline Phosphatase: 182 U/L — ABNORMAL HIGH (ref 38–126)
Anion gap: 7 (ref 5–15)
BUN: 7 mg/dL — ABNORMAL LOW (ref 8–23)
CO2: 20 mmol/L — ABNORMAL LOW (ref 22–32)
Calcium: 6.8 mg/dL — ABNORMAL LOW (ref 8.9–10.3)
Chloride: 108 mmol/L (ref 98–111)
Creatinine, Ser: 0.62 mg/dL (ref 0.44–1.00)
GFR, Estimated: >60 mL/min (ref >60)
Glucose, Bld: 101 mg/dL — ABNORMAL HIGH (ref 70–99)
Potassium: 3.4 mmol/L — ABNORMAL LOW (ref 3.5–5.1)
Sodium: 135 mmol/L (ref 135–145)
Total Bilirubin: 0.6 mg/dL (ref 0.3–1.2)
Total Protein: 5.9 g/dL — ABNORMAL LOW (ref 6.5–8.1)

## 2021-01-28 LAB — CORTISOL-AM, BLOOD: Cortisol - AM: 8.3 ug/dL (ref 6.7–22.6)

## 2021-01-28 LAB — GLUCOSE, CAPILLARY: Glucose-Capillary: 196 mg/dL — ABNORMAL HIGH (ref 70–99)

## 2021-01-28 LAB — HEMOGLOBIN A1C
Hgb A1c MFr Bld: 5.5 % (ref 4.8–5.6)
Mean Plasma Glucose: 111.15 mg/dL

## 2021-01-28 LAB — PROCALCITONIN: Procalcitonin: 0.12 ng/mL

## 2021-01-28 MED ORDER — SODIUM CHLORIDE 1 G PO TABS
1.0000 g | ORAL_TABLET | Freq: Two times a day (BID) | ORAL | Status: DC
  Administered 2021-01-28 – 2021-01-29 (×3): 1 g via ORAL
  Filled 2021-01-28 (×5): qty 1

## 2021-01-28 MED ORDER — OXYCODONE HCL 5 MG PO TABS
5.0000 mg | ORAL_TABLET | Freq: Four times a day (QID) | ORAL | Status: DC | PRN
  Administered 2021-01-28: 10 mg via ORAL
  Administered 2021-01-28: 5 mg via ORAL
  Administered 2021-01-28 – 2021-01-29 (×2): 10 mg via ORAL
  Filled 2021-01-28: qty 2
  Filled 2021-01-28: qty 1
  Filled 2021-01-28 (×2): qty 2

## 2021-01-28 MED ORDER — DEXTROSE 50 % IV SOLN
1.0000 | Freq: Once | INTRAVENOUS | Status: AC
  Administered 2021-01-28: 50 mL via INTRAVENOUS
  Filled 2021-01-28: qty 50

## 2021-01-28 MED ORDER — CHLORHEXIDINE GLUCONATE CLOTH 2 % EX PADS
6.0000 | MEDICATED_PAD | Freq: Every day | CUTANEOUS | Status: DC
  Administered 2021-01-29: 6 via TOPICAL

## 2021-01-28 MED ORDER — INSULIN ASPART 100 UNIT/ML IJ SOLN
0.0000 [IU] | Freq: Three times a day (TID) | INTRAMUSCULAR | Status: DC
  Administered 2021-01-29: 1 [IU] via SUBCUTANEOUS

## 2021-01-28 NOTE — Progress Notes (Signed)
PROGRESS NOTE  Brief Narrative: Jillian Leblanc is a 76 y.o. female with a history of SSS s/p PPM, T2DM, chronic HFpEF, hypothyroidism, E. coli bacteremia, epidural abscess and vertebral osteomyelitis still on ancef s/p decompression and drainage, indwelling foley catheter with recent admit for pseudomonas CAUTI s/p 7 days cefepime, discharged on 12/30 returning from home on 1/4 with increasing confusion. There had also been an interruption in ancef delivery and she had missed a couple doses. She was afebrile but hypotensive in the ED with pyuria noted on UA. Cefepime was started in lieu of ancef and the patient admitted this morning by Dr. Alcario Drought.   Subjective: Blood pressure has improved. Pt feels well, thinks her cognition is improved. Concerned about drainage from the back wound, though not in pain currently. After my conversation with her, she incorrectly stated to the RN that she was to be discharged today.   Objective: BP 137/63    Pulse 75    Temp 98.3 F (36.8 C) (Oral)    Resp 17    Ht 5' (1.524 m)    Wt 80.7 kg    SpO2 94%    BMI 34.76 kg/m   Gen: Chronically ill-appearing female in no distress Pulm: Clear and nonlabored on 2L O2 (chronic/baseline) CV: Regular paced, no murmur, no JVD, no edema GI: Soft, NT, ND, +BS  Neuro: Alert and mostly oriented, impaired recall. No focal deficits. Skin: Required max assistance to roll onto right side for back exam. Midline incision has serous drainage without odor or exudate, no significant spreading erythema or significant tenderness.   Assessment & Plan: Urine culture remains pending. Given that she has been admitted 7 times in the past 6 months, most recently with CAUTI and presents with similar complaints, we will monitor for urine and blood culture results, continue empiric cefepime.   I spoke with her daughter by phone. Pt's mentation has improved but is not at her previous baseline just yet. Ancef was delivered late last night and will be  available at home at discharge.  Hopeful for DC tomorrow AM. Otherwise, please see H&P.  Patrecia Pour, MD Pager on amion 01/28/2021, 1:45 PM

## 2021-01-28 NOTE — ED Notes (Signed)
[phone] [name] pt daughter would like an update asap

## 2021-01-28 NOTE — Telephone Encounter (Signed)
Pt currently in the ER since early this morning. Will follow up once out of hospital

## 2021-01-28 NOTE — H&P (Signed)
History and Physical    Jillian Leblanc RCB:[id number] DOB: [birth year] DOA: 01/27/2021  PCP: Guadlupe Spanish, MD  Patient coming from: Home  I have personally briefly reviewed patient's old medical records in [place]  Chief Complaint: Confusion  HPI: Jillian Leblanc is a 76 y.o. female with medical history significant of DM2, sinus pause s/p PPM, HFpEF, hypothyroidism.  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  Had decompression and drainage at that time.  E.coli bacteremia also at that time.  Pt put on Ancef with plans to treat through Feb.  Pt re-admitted 12/20-12/30 for AMS due to CAUTI (chronic foley use due to urinary retention).  Urine grew out pseudomonas at that time, pt treated with 7 days of cefepime.  Today pt was more confused than normal, this prompted daughter to call EMS and have pt sent to ED.  Daughter also notes that pt ancef didn't come today in mail so only got 1 dose today.  Pt states she is feeling "fine" with some chronic back pain but nothing worse than normal.  Wound drainage that is chronic.   ED Course: UA with large LE, 11-20 WBC.  Pt with episode of hypotension in ED that resolved with IVF.  Pt put on cefepime by EDP for concern of recurrent UTI.   Review of Systems: As per HPI, otherwise all review of systems negative.  Past Medical History:  Diagnosis Date   CHF (congestive heart failure) (HCC)    Chronic back pain    Diabetes mellitus without complication ([place])    Dysrhythmia    Hypothyroidism    Interstitial cystitis    Presence of permanent cardiac pacemaker    Thyroid disease     Past Surgical History:  Procedure Laterality Date   ABDOMINAL HYSTERECTOMY     CHOLECYSTECTOMY     NECK SURGERY     PACEMAKER IMPLANT N/A 08/16/2019   Procedure: PACEMAKER IMPLANT;  Surgeon: Constance Haw, MD;  Location: [facility];  Service: Cardiovascular;  Laterality: N/A;    REVERSE SHOULDER ARTHROPLASTY Right 07/28/2020   Procedure: REVERSE SHOULDER ARTHROPLASTY;  Surgeon: Justice Britain, MD;  Location: [facility];  Service: Orthopedics;  Laterality: Right;   SHOULDER SURGERY     THYROIDECTOMY       reports that she has never smoked. She has never used smokeless tobacco. She reports that she does not drink alcohol and does not use drugs.  Allergies  Allergen Reactions   Pentosan Polysulfate Sodium Other (See Comments)    "ELMIRON Y Drug hair fell out 10/21/2009 12:00:00 AM by [name] CNA 1"   Elmiron [Pentosan Polysulfate] Other (See Comments)    "Hair fell out"   Naloxone Other (See Comments)    Hallucinations/Confusion/Nightmares    Family History  Problem Relation Age of Onset   Immunodeficiency Neg Hx      Prior to Admission medications   Medication Sig Start Date End Date Taking? Authorizing Provider  acetaminophen (TYLENOL) 500 MG tablet Take 2 tablets (1,000 mg total) by mouth every 8 (eight) hours as needed. Patient taking differently: Take 1,000 mg by mouth every 8 (eight) hours as needed (for pain- not to exceed 3-4 grams total from all combined sources/24 hours). 01/08/21  Yes Ghimire, Henreitta Leber, MD  oxyCODONE (OXY IR/ROXICODONE) 5 MG immediate release tablet Take 1-2 tablets (5-10 mg total) by mouth every 6 (six) hours as needed for moderate pain or severe pain. 01/08/21  Yes Ghimire,  Henreitta Leber, MD  calcium-vitamin D 250-100 MG-UNIT tablet Take 1 tablet by mouth 2 (two) times daily. 09/14/20   Palumbo, April, MD  ceFAZolin (ANCEF) IVPB Inject 2 g into the vein every 8 (eight) hours. Indication: E.coli epidural abscess/osteo First Dose: Yes Last Day of Therapy:  03/01/21 Labs - Once weekly:  CBC/D and BMP, Labs - Every other week:  ESR and CRP Method of administration: IV Push Method of administration may be changed at the discretion of home infusion pharmacist based upon assessment of the patient and/or caregiver's ability to self-administer  the medication ordered. 01/08/21 03/03/21  Ghimire, Henreitta Leber, MD  folic acid (FOLVITE) 1 MG tablet Take 1 tablet (1 mg total) by mouth daily. 01/23/21   Regalado, Belkys A, MD  furosemide (LASIX) 40 MG tablet Take 1 tablet (40 mg total) by mouth daily. 01/08/21   Ghimire, Henreitta Leber, MD  gabapentin (NEURONTIN) 300 MG capsule Take 1 capsule (300 mg total) by mouth 2 (two) times daily. Patient taking differently: Take 300 mg by mouth in the morning and at bedtime. 12/08/20   Nita Sells, MD  insulin aspart (NOVOLOG) 100 UNIT/ML injection 0-9 Units, Subcutaneous, 3 times daily with meals, Sensitive (thin, NPO, renal) CBG < 70: Implement Hypoglycemia measures CBG 70 - 120: 0 units CBG 121 - 150: 1 unit CBG 151 - 200: 2 units CBG 201 - 250: 3 units CBG 251 - 300: 5 units CBG 301 - 350: 7 units CBG 351 - 400: 9 units CBG > 400: call MD Patient taking differently: Inject 0-14 Units into the skin See admin instructions. Inject 0-14 units into the skin three times a day before meals and at bedtime, per "PEC sliding scale for resident's FSBS result: BGL <80 or >400, place in PEC problem book for f/u; 0-200 = give nothing; 201-250 = 2 units; 251-300 = 4 units; 301-350 = 6 units; 351-400 = 8 units; 401-450 = 10 units; 451-500 = 12 units; >500 = 14 units and re-check in 2 hrs/call MD if FSBS remains over 500." 01/08/21   Ghimire, Henreitta Leber, MD  ipratropium-albuterol (DUONEB) 0.5-2.5 (3) MG/3ML SOLN Take 3 mLs by nebulization every 8 (eight) hours. 01/08/21   Ghimire, Henreitta Leber, MD  levothyroxine (SYNTHROID) 150 MCG tablet Take 150 mcg by mouth daily before breakfast. 09/01/20   [provider]  lidocaine (LIDODERM) 5 % Place 1 patch onto the skin daily. Remove & Discard patch within 12 hours or as directed by MD Patient taking differently: Place 1 patch onto the skin See admin instructions. Apply 1 patch topically daily and remove at bedtime- (Remove & Discard patch within 12 hours or as directed by MD)  01/09/21   Ghimire, Henreitta Leber, MD  magnesium oxide (MAG-OX) 400 (240 Mg) MG tablet Take 1 tablet (400 mg total) by mouth 2 (two) times daily. 01/22/21   Regalado, Belkys A, MD  OXYGEN Inhale 2 L/min into the lungs continuous.    [provider]  pantoprazole (PROTONIX) 40 MG tablet Take 1 tablet (40 mg total) by mouth daily. Patient taking differently: Take 40 mg by mouth in the morning. 01/09/21   Ghimire, Henreitta Leber, MD  polyethylene glycol (MIRALAX / GLYCOLAX) 17 g packet Take 17 g by mouth daily as needed. Patient taking differently: Take 17 g by mouth daily as needed for mild constipation. 01/08/21   Ghimire, Henreitta Leber, MD  potassium chloride (KLOR-CON) 20 MEQ packet Take 40 mEq by mouth daily. 01/23/21   Regalado, Belkys A,  MD  pramipexole (MIRAPEX) 1.5 MG tablet Take 1.5 mg by mouth 3 (three) times daily. 06/29/20   [provider]  QUEtiapine (SEROQUEL) 25 MG tablet Take 1 tablet (25 mg total) by mouth at bedtime. 01/08/21   Ghimire, Henreitta Leber, MD  senna-docusate (SENOKOT-S) 8.6-50 MG tablet Take 1 tablet by mouth 2 (two) times daily. 01/22/21   Regalado, Belkys A, MD  sodium chloride 1 g tablet Take 1 tablet (1 g total) by mouth 2 (two) times daily with a meal. 01/08/21   Ghimire, Henreitta Leber, MD  traZODone (DESYREL) 50 MG tablet Take 1 tablet (50 mg total) by mouth at bedtime as needed for sleep. 01/22/21   Regalado, Belkys A, MD  vitamin B-12 1000 MCG tablet Take 1 tablet (1,000 mcg total) by mouth daily. 01/23/21   Elmarie Shiley, MD    Physical Exam: Vitals:   01/27/21 2245 01/27/21 2300 01/28/21 0045 01/28/21 0130  BP: 107/60 (!) 108/55 (!) 87/62 125/63  Pulse: 69 62 71 64  Resp: _0 Temp:      TempSrc:      SpO2: 99% 98% 100% 94%  Weight:      Height:        Constitutional: NAD, calm, comfortable, chronically ill appearing Eyes: PERRL, lids and conjunctivae normal ENMT: Mucous membranes are moist. Posterior pharynx clear of any exudate or  lesions.Normal dentition.  Neck: normal, supple, no masses, no thyromegaly Respiratory: clear to auscultation bilaterally, no wheezing, no crackles. Normal respiratory effort. No accessory muscle use.  Cardiovascular: Regular rate and rhythm, no murmurs / rubs / gallops. No extremity edema. 2+ pedal pulses. No carotid bruits.  Abdomen: no tenderness, no masses palpated. No hepatosplenomegaly. Bowel sounds positive.  Musculoskeletal: no clubbing / cyanosis. No joint deformity upper and lower extremities. Good ROM, no contractures. Normal muscle tone.  Skin: Lumbar incision with small amount of serosanguinous drainage.  PICC right arm Neurologic: CN 2-12 grossly intact. Sensation intact, DTR normal. Strength 5/5 in all 4.  Psychiatric: Normal judgment and insight. Alert and oriented x 3. Normal mood.    Labs on Admission: I have personally reviewed following labs and imaging studies  CBC: Recent Labs  Lab 01/21/21 0459 01/27/21 1855  WBC 5.2 6.7  NEUTROABS  --  4.0  HGB 7.9* 8.3*  HCT 24.6* 26.5*  MCV 89.8 93.6  PLT 325 761   Basic Metabolic Panel: Recent Labs  Lab 01/21/21 0820 01/27/21 1855  NA  --  127*  K  --  4.2  CL  --  99  CO2  --  21*  GLUCOSE  --  130*  BUN  --  8  CREATININE  --  0.69  CALCIUM  --  6.4*  MG 1.8  --    GFR: Estimated Creatinine Clearance: 58.9 mL/min (by C-G formula based on SCr of 0.69 mg/dL). Liver Function Tests: Recent Labs  Lab 01/27/21 1855  AST 20  ALT 5  ALKPHOS 217*  BILITOT 0.6  PROT 5.9*  ALBUMIN 1.6*   No results for input(s): LIPASE, AMYLASE in the last 168 hours. No results for input(s): AMMONIA in the last 168 hours. Coagulation Profile: No results for input(s): INR, PROTIME in the last 168 hours. Cardiac Enzymes: No results for input(s): CKTOTAL, CKMB, CKMBINDEX, TROPONINI in the last 168 hours. BNP (last 3 results) No results for input(s): PROBNP in the last 8760 hours. HbA1C: No results for input(s): HGBA1C in  the last 72 hours. CBG: Recent  Labs  Lab 01/21/21 1125 01/21/21 1618 01/21/21 2055 01/22/21 0642 01/22/21 1141  GLUCAP 192* 104* 145* 114* 186*   Lipid Profile: No results for input(s): CHOL, HDL, LDLCALC, TRIG, CHOLHDL, LDLDIRECT in the last 72 hours. Thyroid Function Tests: No results for input(s): TSH, T4TOTAL, FREET4, T3FREE, THYROIDAB in the last 72 hours. Anemia Panel: No results for input(s): VITAMINB12, FOLATE, FERRITIN, TIBC, IRON, RETICCTPCT in the last 72 hours. Urine analysis:    Component Value Date/Time   COLORURINE YELLOW 01/27/2021 2228   APPEARANCEUR CLOUDY (A) 01/27/2021 2228   LABSPEC 1.000 (L) 01/27/2021 2228   PHURINE 7.0 01/27/2021 2228   GLUCOSEU >=500 (A) 01/27/2021 2228   HGBUR SMALL (A) 01/27/2021 2228   BILIRUBINUR NEGATIVE 01/27/2021 2228   KETONESUR NEGATIVE 01/27/2021 2228   PROTEINUR NEGATIVE 01/27/2021 2228   UROBILINOGEN 0.2 09/15/2012 1536   NITRITE NEGATIVE 01/27/2021 2228   LEUKOCYTESUR LARGE (A) 01/27/2021 2228    Radiological Exams on Admission: CT Head Wo Contrast  Result Date: 01/27/2021 CLINICAL DATA:  Mental status change, unknown cause EXAM: CT HEAD WITHOUT CONTRAST TECHNIQUE: Contiguous axial images were obtained from the base of the skull through the vertex without intravenous contrast. COMPARISON:  CT head 11/26/2020 FINDINGS: Brain: No evidence of large-territorial acute infarction. No parenchymal hemorrhage. No mass lesion. No extra-axial collection. No mass effect or midline shift. No hydrocephalus. Basilar cisterns are patent. Vascular: No hyperdense vessel. Skull: No acute fracture or focal lesion. Sinuses/Orbits: Paranasal sinuses and mastoid air cells are clear. The orbits are unremarkable. Other: None. IMPRESSION: No acute intracranial abnormality. Electronically Signed   By: Iven Finn M.D.   On: 01/27/2021 21:26   DG Chest Portable 1 View  Result Date: 01/27/2021 CLINICAL DATA:  AMS EXAM: PORTABLE CHEST 1 VIEW  COMPARISON:  None. FINDINGS: The heart and mediastinal contours are unchanged. Aortic calcification. Left chest wall 2 lead pacemaker in similar position. Low lung volumes. No focal consolidation. No pulmonary edema. No pleural effusion. No pneumothorax. No acute osseous abnormality. Reversed total right shoulder arthroplasty. IMPRESSION: Low lung volumes with no active disease. Electronically Signed   By: Iven Finn M.D.   On: 01/27/2021 19:18    EKG: Independently reviewed.  Assessment/Plan Principal Problem:   Sepsis secondary to UTI [facility]) Active Problems:   Anemia of chronic disease   Hyponatremia   Diabetes mellitus type 2 in obese (HCC)   Acute metabolic encephalopathy   E coli bacteremia   Hypotension   Chronic indwelling Foley catheter    Acute encephalopathy, hypotension in ED: ? Recurrent CAUTI Will leave on empiric cefepime for the moment UCx and BCx pending Repeat labs in AM Hyponatremia - Worse than discharge Cont PO salt tabs Will hold lasix for the moment Hypoalbuminemia - Likely due to chronic disease Giving IV albumin as part of IVF today Anemia of chronic disease - HGB 8.3 is up from discharge, but suspect this likely hemoconcentration due to dehydration Repeat HGB in AM DM2 - Sensitive SSI AC Osteomyelitis of spine - With E.Coli Ancef on hold while pt getting cefepime Chronic foley use - Due to urinary retention  DVT prophylaxis: Lovenox Code Status: Full Family Communication: No family in room Disposition Plan: Home after mental status improved / recurrent CAUTI ruled out Consults called: None Admission status: Place in obs     [place], [place] [facility]  How to contact the [facility] Attending or Consulting provider [place] or covering provider during after hours 7P -7A, for this patient?  Check the care team in [facility] and look for a) attending/consulting TRH provider listed and b) the [facility] team listed Log into [email or web address]  Amion  Physician Scheduling and messaging for groups and whole hospitals  On call and physician scheduling software for group practices, residents, hospitalists and other medical providers for call, clinic, rotation and shift schedules. OnCall [employer] is a hospital-wide system for scheduling doctors and paging doctors on call. EasyPlot is for scientific plotting and data analysis.  [email or web address]  and use [place]'s universal password to access. If you do not have the password, please contact the hospital operator.  Locate the [facility] provider you are looking for under [facility] and page to a number that you can be directly reached. If you still have difficulty reaching the provider, please page the [facility] (Director on Call) for the Hospitalists listed on amion for assistance.  01/28/2021, 1:47 AM

## 2021-01-28 NOTE — ED Notes (Signed)
Haze Boyden, patient's emergency contact, on patient status.

## 2021-01-28 NOTE — ED Notes (Signed)
Pt given 8 oz of juice for CBG of 69.

## 2021-01-29 ENCOUNTER — Ambulatory Visit: Payer: Medicare Other | Admitting: Internal Medicine

## 2021-01-29 DIAGNOSIS — G9341 Metabolic encephalopathy: Secondary | ICD-10-CM | POA: Diagnosis not present

## 2021-01-29 DIAGNOSIS — A419 Sepsis, unspecified organism: Secondary | ICD-10-CM | POA: Diagnosis not present

## 2021-01-29 DIAGNOSIS — D638 Anemia in other chronic diseases classified elsewhere: Secondary | ICD-10-CM | POA: Diagnosis not present

## 2021-01-29 DIAGNOSIS — Z978 Presence of other specified devices: Secondary | ICD-10-CM | POA: Diagnosis not present

## 2021-01-29 LAB — GLUCOSE, CAPILLARY
Glucose-Capillary: 115 mg/dL — ABNORMAL HIGH (ref 70–99)
Glucose-Capillary: 134 mg/dL — ABNORMAL HIGH (ref 70–99)

## 2021-01-29 LAB — CBC WITH DIFFERENTIAL/PLATELET
Abs Immature Granulocytes: 0.06 10*3/uL (ref 0.00–0.07)
Basophils Absolute: 0.1 10*3/uL (ref 0.0–0.1)
Basophils Relative: 1 %
Eosinophils Absolute: 0 10*3/uL (ref 0.0–0.5)
Eosinophils Relative: 1 %
HCT: 28.6 % — ABNORMAL LOW (ref 36.0–46.0)
Hemoglobin: 9.6 g/dL — ABNORMAL LOW (ref 12.0–15.0)
Immature Granulocytes: 1 %
Lymphocytes Relative: 34 %
Lymphs Abs: 2.1 10*3/uL (ref 0.7–4.0)
MCH: 30.1 pg (ref 26.0–34.0)
MCHC: 33.6 g/dL (ref 30.0–36.0)
MCV: 89.7 fL (ref 80.0–100.0)
Monocytes Absolute: 0.4 10*3/uL (ref 0.1–1.0)
Monocytes Relative: 7 %
Neutro Abs: 3.3 10*3/uL (ref 1.7–7.7)
Neutrophils Relative %: 56 %
Platelets: 331 10*3/uL (ref 150–400)
RBC: 3.19 MIL/uL — ABNORMAL LOW (ref 3.87–5.11)
RDW: 17.7 % — ABNORMAL HIGH (ref 11.5–15.5)
WBC: 6 10*3/uL (ref 4.0–10.5)
nRBC: 0 % (ref 0.0–0.2)

## 2021-01-29 LAB — URINE CULTURE: Culture: >=100000 — AB

## 2021-01-29 MED ORDER — HEPARIN SOD (PORK) LOCK FLUSH 100 UNIT/ML IV SOLN
250.0000 [IU] | INTRAVENOUS | Status: AC | PRN
  Administered 2021-01-29: 250 [IU]
  Filled 2021-01-29: qty 2.5

## 2021-01-29 NOTE — TOC Transition Note (Signed)
Transition of Care [facility]) - CM/SW Discharge Note   Patient Details  Name: Jillian Leblanc MRN: [id number] Date of Birth: September 12, 1945  [facility]) CM/SW Contact:  Tom-Baldini, Hershal Coria, RN Phone Number: 01/29/2021, 11:19 AM   Clinical Narrative:    [name] scheduled for discharge today. CM called and notified daughter, [name] of discharge and transportation. [name] states she is on her way to work but her father, patient's husband is at home and it's ok to call [facility]. Patient was discharged last week. Active with [facility] for home health PT/OT, Kandee Keen notified of discharge. Patient to continue Ancef antibiotics till 03/01/21 per ID. Patient active with [facility] and Pam notified. CM called [facility] and scheduled transportation. No further TOC needs noted.     Final next level of care: Home w Home Health Services Barriers to Discharge: Barriers Resolved   Patient Goals and CMS Choice Patient states their goals for this hospitalization and ongoing recovery are:: To return home with daughter CMS Medicare.gov Compare Post Acute Care list provided to:: Patient    Discharge Placement                       Discharge Plan and Services                          HH Arranged: PT, OT HH Agency: [facility]        Social Determinants of Health (SDOH) Interventions     Readmission Risk Interventions No flowsheet data found.

## 2021-01-29 NOTE — Progress Notes (Signed)
DISCHARGE NOTE HOME Jillian Leblanc to be discharged Home per MD order. Discussed prescriptions and follow up appointments with the patient. Prescriptions given to patient; medication list explained in detail. Patient verbalized understanding.  Skin clean, dry and intact without evidence of skin break down, no evidence of skin tears noted. IV catheter discontinued intact. Site without signs and symptoms of complications. Dressing and pressure applied. Pt denies pain at the site currently. No complaints noted.  Patient free of lines, drains, and wounds.   An After Visit Summary (AVS) was printed and given to the patient. Patient escorted via wheelchair, and discharged home via private auto.  Katrina Stack, RN

## 2021-01-29 NOTE — Plan of Care (Signed)
°  Problem: Education: Goal: Knowledge of General Education information will improve Description: Including pain rating scale, medication(s)/side effects and non-pharmacologic comfort measures 01/29/2021 1238 by Katrina Stack, RN Outcome: Adequate for Discharge 01/29/2021 1238 by Katrina Stack, RN Outcome: Adequate for Discharge 01/29/2021 0841 by Katrina Stack, RN Outcome: Progressing   Problem: Health Behavior/Discharge Planning: Goal: Ability to manage health-related needs will improve 01/29/2021 1238 by Katrina Stack, RN Outcome: Adequate for Discharge 01/29/2021 1238 by Katrina Stack, RN Outcome: Adequate for Discharge   Problem: Clinical Measurements: Goal: Ability to maintain clinical measurements within normal limits will improve 01/29/2021 1238 by Katrina Stack, RN Outcome: Adequate for Discharge 01/29/2021 1238 by Katrina Stack, RN Outcome: Adequate for Discharge Goal: Will remain free from infection 01/29/2021 1238 by Katrina Stack, RN Outcome: Adequate for Discharge 01/29/2021 1238 by Katrina Stack, RN Outcome: Adequate for Discharge 01/29/2021 0841 by Katrina Stack, RN Outcome: Progressing Goal: Diagnostic test results will improve 01/29/2021 1238 by Katrina Stack, RN Outcome: Adequate for Discharge 01/29/2021 1238 by Katrina Stack, RN Outcome: Adequate for Discharge Goal: Respiratory complications will improve 01/29/2021 1238 by Katrina Stack, RN Outcome: Adequate for Discharge 01/29/2021 1238 by Katrina Stack, RN Outcome: Adequate for Discharge Goal: Cardiovascular complication will be avoided 01/29/2021 1238 by Katrina Stack, RN Outcome: Adequate for Discharge 01/29/2021 1238 by Katrina Stack, RN Outcome: Adequate for Discharge   Problem: Activity: Goal: Risk for activity intolerance will decrease 01/29/2021 1238 by Katrina Stack, RN Outcome: Adequate for Discharge 01/29/2021 1238 by Katrina Stack, RN Outcome:  Adequate for Discharge   Problem: Nutrition: Goal: Adequate nutrition will be maintained 01/29/2021 1238 by Katrina Stack, RN Outcome: Adequate for Discharge 01/29/2021 1238 by Katrina Stack, RN Outcome: Adequate for Discharge 01/29/2021 0841 by Katrina Stack, RN Outcome: Progressing   Problem: Coping: Goal: Level of anxiety will decrease 01/29/2021 1238 by Katrina Stack, RN Outcome: Adequate for Discharge 01/29/2021 1238 by Katrina Stack, RN Outcome: Adequate for Discharge   Problem: Elimination: Goal: Will not experience complications related to bowel motility 01/29/2021 1238 by Katrina Stack, RN Outcome: Adequate for Discharge 01/29/2021 1238 by Katrina Stack, RN Outcome: Adequate for Discharge Goal: Will not experience complications related to urinary retention 01/29/2021 1238 by Katrina Stack, RN Outcome: Adequate for Discharge 01/29/2021 1238 by Katrina Stack, RN Outcome: Adequate for Discharge 01/29/2021 0841 by Katrina Stack, RN Outcome: Progressing   Problem: Pain Managment: Goal: General experience of comfort will improve 01/29/2021 1238 by Katrina Stack, RN Outcome: Adequate for Discharge 01/29/2021 1238 by Katrina Stack, RN Outcome: Adequate for Discharge   Problem: Safety: Goal: Ability to remain free from injury will improve 01/29/2021 1238 by Katrina Stack, RN Outcome: Adequate for Discharge 01/29/2021 1238 by Katrina Stack, RN Outcome: Adequate for Discharge   Problem: Skin Integrity: Goal: Risk for impaired skin integrity will decrease 01/29/2021 1238 by Katrina Stack, RN Outcome: Adequate for Discharge 01/29/2021 1238 by Katrina Stack, RN Outcome: Adequate for Discharge

## 2021-01-29 NOTE — Plan of Care (Signed)
  Problem: Education: Goal: Knowledge of General Education information will improve Description Including pain rating scale, medication(s)/side effects and non-pharmacologic comfort measures Outcome: Progressing   Problem: Clinical Measurements: Goal: Will remain free from infection Outcome: Progressing   Problem: Nutrition: Goal: Adequate nutrition will be maintained Outcome: Progressing   Problem: Elimination: Goal: Will not experience complications related to urinary retention Outcome: Progressing   

## 2021-01-29 NOTE — Discharge Summary (Signed)
Physician Discharge Summary  Jillian Leblanc GXQ:[id number] DOB: 12-27-1945 DOA: 01/27/2021  PCP: Guadlupe Spanish, MD  Admit date: 01/27/2021 Discharge date: 01/29/2021  Admitted From: Home Disposition: Home   Recommendations for Outpatient Follow-up:  Follow up with PCP in 1-2 weeks Suggest to stop novolog at home with hypoglycemia here despite not giving any insulin/antihyperglycemic agents.  Continue ancef thru PICC until 03/01/2021 as previously recommended.  Home Health: None new Equipment/Devices: None new Discharge Condition: Stable CODE STATUS: Full Diet recommendation: Heart healthy  Brief/Interim Summary: Jillian Leblanc is a 76 y.o. female with a history of SSS s/p PPM, T2DM, chronic HFpEF, hypothyroidism, E. coli bacteremia, epidural abscess and vertebral osteomyelitis still on ancef s/p decompression and drainage, indwelling foley catheter with recent admit for pseudomonas CAUTI s/p 7 days cefepime, discharged on 12/30 returning from home on 1/4 with increasing confusion. There had also been an interruption in ancef delivery and she had missed a couple doses. She was afebrile but hypotensive which resolved with IVF. Urinalysis revealed pyuria and urine culture was sent (currently reincubated for better growth), though this sample was from chronic foley. There has been no leukocytosis, fever, or symptoms to suggest recurrent UTI. Cefepime had been started empirically, though she will be discharged with plans to continue ancef as was the previous plan and to remain conscious of need to stay hydrated.    Discharge Diagnoses:  Principal Problem:   Sepsis secondary to UTI [facility]) Active Problems:   Anemia of chronic disease   Hyponatremia   Diabetes mellitus type 2 in obese (HCC)   Acute metabolic encephalopathy   E coli bacteremia   Hypotension   Chronic indwelling Foley catheter  Acute metabolic encephalopathy, hypotension: Both likely due to dehydration and resolved. Hypoglycemia  may contribute as well. Without leukocytosis, fever, or new localizing symptoms, doubt UTI. Will DC cefepime and plan to continue with ancef as she was PTA.   Vertebral osteomyelitis, epidural abscess, E. coli bacteremia: PCT 0.12, lactic acid normal. S/P L4- L5 laminectomy with evacuation of epidural abscess on 12/12. Wound appears noninfected during this hospitalization. - Confirmed with daughter that ancef has been delivered, so she will continue that at discharge as previously planned. Per discharge summary 12/30, until 03/01/2021 as recommended by ID.  Hyponatremia: Resolved with isotonic saline, suspect hypovolemic.   T2DM: HbA1c is 5.5% and pt had borderline hypoglycemia here despite not receiving her usual novolog. Will recommend to DC novolog at discharge.   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   Hypothyroidism: Continue synthroid. Last TSH is 1.769.  Urinary retention with chronic foley catheter: Urine culture pending, though, per discussion above, any growth would be suspected to be colonization.   Obesity: Estimated body mass index is 33.2 kg/m as calculated from the following:   Height as of this encounter: 5' (1.524 m).   Weight as of this encounter: 77.1 kg.  Discharge Instructions Discharge Instructions     Diet - low sodium heart healthy   Complete by: As directed    Discharge instructions   Complete by: As directed    You were evaluated for altered mental status that was likely due to dehydration. You were treated with a different antibiotic in case you had a new urinary tract infection but it doesn't appear that you do, so you should just continue taking ancef through the IV as you were doing prior to coming in. If you were taking novolog, you should stop  doing so as your blood sugars are low. You should check blood sugars and eat/drink if your sugar goes <70. You should continue taking salt tablets and lasix  as long as you are eating and drinking normally. If you are eating/drinking less than usual, hold the lasix and potassium.   Follow up with your PCP in the next 1-2 weeks, suggesting recheck of labs, and continue ancef through 2/6. Seek medical attention again if the supply of ancef is interrupted or if your symptoms return.   Increase activity slowly   Complete by: As directed       Allergies as of 01/29/2021       Reactions   Pentosan Polysulfate Sodium Other (See Comments)   "ELMIRON Y Drug hair fell out 10/21/2009 12:00:00 AM by [name] CNA 1"   Elmiron [pentosan Polysulfate] Other (See Comments)   "Hair fell out"   Naloxone Other (See Comments)   Hallucinations/Confusion/Nightmares        Medication List     STOP taking these medications    insulin aspart 100 UNIT/ML injection Commonly known as: novoLOG       TAKE these medications    acetaminophen 500 MG tablet Commonly known as: TYLENOL Take 2 tablets (1,000 mg total) by mouth every 8 (eight) hours as needed. What changed: reasons to take this   calcium-vitamin D 250-100 MG-UNIT tablet Take 1 tablet by mouth 2 (two) times daily.   ceFAZolin  IVPB Commonly known as: ANCEF Inject 2 g into the vein every 8 (eight) hours. Indication: E.coli epidural abscess/osteo First Dose: Yes Last Day of Therapy:  03/01/21 Labs - Once weekly:  CBC/D and BMP, Labs - Every other week:  ESR and CRP Method of administration: IV Push Method of administration may be changed at the discretion of home infusion pharmacist based upon assessment of the patient and/or caregiver's ability to self-administer the medication ordered.   cyanocobalamin 1000 MCG tablet Take 1 tablet (1,000 mcg total) by mouth daily.   folic acid 1 MG tablet Commonly known as: FOLVITE Take 1 tablet (1 mg total) by mouth daily.   furosemide 40 MG tablet Commonly known as: LASIX Take 1 tablet (40 mg total) by mouth daily.   gabapentin 300 MG  capsule Commonly known as: NEURONTIN Take 1 capsule (300 mg total) by mouth 2 (two) times daily. What changed: when to take this   ipratropium-albuterol 0.5-2.5 (3) MG/3ML Soln Commonly known as: DUONEB Take 3 mLs by nebulization every 8 (eight) hours.   levothyroxine 150 MCG tablet Commonly known as: SYNTHROID Take 150 mcg by mouth daily before breakfast.   lidocaine 5 % Commonly known as: LIDODERM Place 1 patch onto the skin daily. Remove & Discard patch within 12 hours or as directed by MD What changed:  when to take this additional instructions   magnesium oxide 400 (240 Mg) MG tablet Commonly known as: MAG-OX Take 1 tablet (400 mg total) by mouth 2 (two) times daily.   oxyCODONE 5 MG immediate release tablet Commonly known as: Oxy IR/ROXICODONE Take 1-2 tablets (5-10 mg total) by mouth every 6 (six) hours as needed for moderate pain or severe pain.   OXYGEN Inhale 2 L/min into the lungs continuous.   pantoprazole 40 MG tablet Commonly known as: PROTONIX Take 1 tablet (40 mg total) by mouth daily. What changed: when to take this   polyethylene glycol 17 g packet Commonly known as: MIRALAX / GLYCOLAX Take 17 g by mouth daily as needed. What  changed: reasons to take this   potassium chloride 20 MEQ packet Commonly known as: KLOR-CON Take 40 mEq by mouth daily.   pramipexole 1.5 MG tablet Commonly known as: MIRAPEX Take 1.5 mg by mouth 3 (three) times daily.   QUEtiapine 25 MG tablet Commonly known as: SEROQUEL Take 1 tablet (25 mg total) by mouth at bedtime.   senna-docusate 8.6-50 MG tablet Commonly known as: Senokot-S Take 1 tablet by mouth 2 (two) times daily.   sodium chloride 1 g tablet Take 1 tablet (1 g total) by mouth 2 (two) times daily with a meal.   traZODone 50 MG tablet Commonly known as: DESYREL Take 1 tablet (50 mg total) by mouth at bedtime as needed for sleep.        Follow-up Information     Guadlupe Spanish, MD Follow up.    Specialty: Internal Medicine Contact information: [place] [address] [phone]                Allergies  Allergen Reactions   Pentosan Polysulfate Sodium Other (See Comments)    "ELMIRON Y Drug hair fell out 10/21/2009 12:00:00 AM by [name] CNA 1"   Elmiron [Pentosan Polysulfate] Other (See Comments)    "Hair fell out"   Naloxone Other (See Comments)    Hallucinations/Confusion/Nightmares    Consultations: None  Procedures/Studies: DG Chest 1 View  Result Date: 01/12/2021 CLINICAL DATA:  Altered mental status EXAM: CHEST  1 VIEW COMPARISON:  01/06/2021 FINDINGS: Lungs are clear. No pneumothorax or pleural effusion. Right upper extremity PICC line tip is seen within the superior cavoatrial junction. Cardiac size within normal limits. Left subclavian dual lead pacemaker is unchanged with leads overlying the right atrium and right ventricle. Pulmonary vascularity is normal. No acute bone abnormality. Right total shoulder arthroplasty has been performed. IMPRESSION: No active disease. Electronically Signed   By: Fidela Salisbury M.D.   On: 01/12/2021 20:36   DG Lumbar Spine 2-3 Views  Result Date: 01/04/2021 CLINICAL DATA:  Intraoperative evaluation, laminectomy infusion EXAM: LUMBAR SPINE - 2-3 VIEW COMPARISON:  01/04/2021 FINDINGS: Four fluoroscopic images are obtained during the performance of the procedure and are provided for interpretation only. Images demonstrate intrapedicular screws within for contiguous levels of the lumbar spine. Evaluation is limited due to technique and body habitus. Please refer to operative report. FLUOROSCOPY TIME:  2 minutes and 7 seconds IMPRESSION: 1. Limited intraoperative evaluation during multilevel lumbar levin ectomy infusion. Electronically Signed   By: Randa Ngo M.D.   On: 01/04/2021 23:30   DG Elbow Complete Left  Result Date: 01/12/2021 CLINICAL DATA:  Left elbow pain EXAM: LEFT ELBOW - COMPLETE 3+ VIEW  COMPARISON:  None. FINDINGS: Normal alignment. No acute fracture or dislocation. Joint spaces appear preserved. No definite effusion though lateral positioning is suboptimal. Extensive diffuse soft tissue swelling is seen of the visualized left upper extremity. IMPRESSION: Diffuse soft tissue swelling.  No acute fracture or dislocation. Electronically Signed   By: Fidela Salisbury M.D.   On: 01/12/2021 20:37   DG Forearm Left  Result Date: 01/12/2021 CLINICAL DATA:  Left arm pain EXAM: LEFT FOREARM - 2 VIEW COMPARISON:  None. FINDINGS: No acute fracture or dislocation. Normal alignment. No osseous erosions or abnormal periosteal reaction. Diffuse subcutaneous edema is seen of the left forearm more severe along the dorsal aspect. IMPRESSION: Extensive soft tissue swelling.  No acute fracture or dislocation. Electronically Signed   By: Fidela Salisbury M.D.   On:  01/12/2021 20:40   DG Wrist Complete Left  Result Date: 01/12/2021 CLINICAL DATA:  Left wrist pain EXAM: LEFT WRIST - COMPLETE 3+ VIEW COMPARISON:  None. FINDINGS: Normal alignment. No acute fracture or dislocation. Joint spaces are preserved. Extensive surrounding soft tissue swelling is noted. Mild dystrophic calcification involving the TFCC versus remote nonunited ulnar styloid fracture. IMPRESSION: No acute fracture or dislocation.  Extensive soft tissue swelling. Electronically Signed   By: Fidela Salisbury M.D.   On: 01/12/2021 20:41   CT Head Wo Contrast  Result Date: 01/27/2021 CLINICAL DATA:  Mental status change, unknown cause EXAM: CT HEAD WITHOUT CONTRAST TECHNIQUE: Contiguous axial images were obtained from the base of the skull through the vertex without intravenous contrast. COMPARISON:  CT head 11/26/2020 FINDINGS: Brain: No evidence of large-territorial acute infarction. No parenchymal hemorrhage. No mass lesion. No extra-axial collection. No mass effect or midline shift. No hydrocephalus. Basilar cisterns are patent. Vascular: No  hyperdense vessel. Skull: No acute fracture or focal lesion. Sinuses/Orbits: Paranasal sinuses and mastoid air cells are clear. The orbits are unremarkable. Other: None. IMPRESSION: No acute intracranial abnormality. Electronically Signed   By: Iven Finn M.D.   On: 01/27/2021 21:26   CT LUMBAR SPINE WO CONTRAST  Result Date: 01/04/2021 CLINICAL DATA:  Low back pain, cauda equina syndrome suspected EXAM: CT LUMBAR SPINE WITHOUT CONTRAST TECHNIQUE: Multidetector CT imaging of the lumbar spine was performed without intravenous contrast administration. Multiplanar CT image reconstructions were also generated. COMPARISON:  CT 12/19/2020 FINDINGS: Segmentation: 5 lumbar type vertebrae. Alignment: Focal kyphosis at T12 related to a T12 anterior compression deformity. No significant listhesis. Vertebrae: Progressive sclerosis and bony destruction of the L5 vertebral body. Height loss remains at about 40%. There is approximately 4 mm of bony retropulsion, similar to prior exam. Paraspinal and other soft tissues: There is bilateral hydronephrosis likely related to a markedly distended urinary bladder which is partially visualized. Aortoiliac atherosclerotic calcifications. No lymphadenopathy. There is a right-sided sacral nerve stimulator in place. Disc levels: Unchanged chronic disc and degenerative endplate changes at H41-D40, T12-L1, and L1-L2. L2-L3: No significant spinal canal or neural foraminal narrowing. L3-L4: Nonspecific mineralization along the anterior spinal canal in a linear and longitudinal orientation. This is new since recent CT on November 26th. L4-L5: Unchanged broad-based disc bulging. Decreased amount of paraspinal gas but increased soft tissue density along the anterior and lateral paraspinal soft tissues (axial series 3, image 96). Moderate to severe right and mild-to-moderate left-sided neural foraminal narrowing. There is mild to moderate spinal canal narrowing. L5-S1: Decreased amount of  intradiscal and paraspinal gas. Unchanged degree of moderate spinal canal stenosis and bilateral neural foraminal narrowing. IMPRESSION: Progressive sclerosis and bony destruction of the L5 vertebral body, though with unchanged overall 40% height loss. Decreased paraspinal and intradiscal gas. Increased anterior and lateral paraspinal soft tissue density at L4-L5. Findings are concerning for discitis osteomyelitis with paraspinal phlegmon. Unchanged degree of spinal canal stenosis and bilateral neural foraminal narrowing at L4-L5 and L5-S1. Assessment for epidural collections is limited by noncontrast CT. If able, MRI would be ideal for further evaluation. Otherwise CT with contrast may be useful. Mineralization along the anterior spinal canal at L3-L4, in a linear and longitudinal orientation, new since recent CT on 11/26. 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 Electronically Signed   By: Maurine Simmering M.D.   On: 01/04/2021 11:11   CT ELBOW  LEFT WO CONTRAST  Result Date: 01/02/2021 CLINICAL DATA:  Soft tissue infection suspected, elbow, xray done E. coli bacteremia with lumbar spine osteo-/epidural abscess-with persistent left elbow pain. EXAM: CT OF THE UPPER LEFT EXTREMITY WITHOUT CONTRAST TECHNIQUE: Multidetector CT imaging of the upper left extremity was performed according to the standard protocol. COMPARISON:  X-ray 12/30/2020 FINDINGS: Technical note: Examination is limited secondary to beam hardening artifact related to patient positioning of the elbow adjacent to the abdominal wall. Elbow is flexed resulting in nonstandard imaging planes. Bones/Joint/Cartilage No acute fracture. No dislocation. Elbow joint spaces are preserved. No erosion or periosteal elevation. No appreciable elbow joint effusion. Ligaments Suboptimally assessed by CT. Muscles and Tendons Evaluation of the  musculotendinous structures is significantly limited. No obvious intramuscular fluid collection. Soft tissues Soft tissue edema with ill-defined fluid most pronounced along the ulnar aspect of the proximal forearm. No organized fluid collection is evident by noncontrast CT. No soft tissue gas. Catheter tubing is visualized within the upper arm. IMPRESSION: 1. Limited exam. 2. Soft tissue edema with ill-defined fluid most pronounced along the ulnar aspect of the proximal forearm, suggesting cellulitis. No organized fluid collection is evident. 3. No appreciable elbow joint effusion to suggest septic arthritis. 4. No acute osseous abnormality. Electronically Signed   By: Davina Poke D.O.   On: 01/02/2021 17:17   DG Chest Portable 1 View  Result Date: 01/27/2021 CLINICAL DATA:  AMS EXAM: PORTABLE CHEST 1 VIEW COMPARISON:  None. FINDINGS: The heart and mediastinal contours are unchanged. Aortic calcification. Left chest wall 2 lead pacemaker in similar position. Low lung volumes. No focal consolidation. No pulmonary edema. No pleural effusion. No pneumothorax. No acute osseous abnormality. Reversed total right shoulder arthroplasty. IMPRESSION: Low lung volumes with no active disease. Electronically Signed   By: Iven Finn M.D.   On: 01/27/2021 19:18   DG Chest Port 1V same Day  Result Date: 01/06/2021 CLINICAL DATA:  Shortness of breath EXAM: PORTABLE CHEST 1 VIEW COMPARISON:  12/21/2020 FINDINGS: Transverse diameter of heart is increased. There are no signs of alveolar pulmonary edema. Increased interstitial markings are seen in the left lower lung fields. There is no focal consolidation. There is no pleural effusion or pneumothorax. Pacemaker battery is seen in the left infraclavicular region. There is previous right shoulder arthroplasty. IMPRESSION: Increased interstitial markings are seen in the left lower lung fields suggesting interstitial pneumonitis. Less likely possibility would be  asymmetric pulmonary edema. Electronically Signed   By: Elmer Picker M.D.   On: 01/06/2021 11:13   DG Shoulder Left  Result Date: 01/17/2021 CLINICAL DATA:  Shoulder pain EXAM: LEFT SHOULDER - 2+ VIEW COMPARISON:  01/12/2021 chest x-ray FINDINGS: Limited two-view exam. There is anterior dislocation of the humerus in relation to the glenoid fossa. No displaced fracture. AC joint aligned. Included chest demonstrates left subclavian 2 lead pacer. Right PICC line tip lower SVC level. Heart is enlarged. IMPRESSION: Left shoulder anterior dislocation. Electronically Signed   By: Jerilynn Mages.  Shick M.D.   On: 01/17/2021 10:17   DG Shoulder Left Port  Result Date: 01/18/2021 CLINICAL DATA:  Left shoulder dislocation with EXAM: LEFT SHOULDER COMPARISON:  Earlier radiograph dated 01/17/2021. FINDINGS: Persistent anterior dislocation of the left shoulder. No obvious acute fracture. The bones are osteopenic. Left pectoral pacemaker device. The soft tissues are grossly unremarkable. IMPRESSION: Persistent anterior dislocation of the left shoulder. Electronically Signed   By: Anner Crete M.D.   On: 01/18/2021 00:05   DG C-Arm 1-60 Min-No Report  Result Date: 01/04/2021 Fluoroscopy was utilized by the requesting physician.  No radiographic interpretation.   DG C-Arm 1-60 Min-No Report  Result Date: 01/04/2021 Fluoroscopy was utilized by the requesting physician.  No radiographic interpretation.   VAS [place] UPPER EXTREMITY VENOUS DUPLEX  Result Date: 01/19/2021 UPPER VENOUS STUDY  Patient Name:  Jillian Leblanc  Date of Exam:   01/18/2021 Medical Rec #: [id number]       Accession #:    [id number] Date of Birth: 1945-11-03       Patient Gender: F Patient Age:   48 years Exam Location:  [facility] Procedure:      VAS [place] UPPER EXTREMITY VENOUS DUPLEX Referring Phys: Emeterio Reeve --------------------------------------------------------------------------------  Indications: Swelling, and Pain  Comparison Study: prior 01/13/21 Performing Technologist: Archie Patten RVS  Examination Guidelines: A complete evaluation includes B-mode imaging, spectral Doppler, color Doppler, and power Doppler as needed of all accessible portions of each vessel. Bilateral testing is considered an integral part of a complete examination. Limited examinations for reoccurring indications may be performed as noted.  Right Findings: +----------+------------+---------+-----------+----------+-------+  RIGHT      Compressible Phasicity Spontaneous Properties Summary  +----------+------------+---------+-----------+----------+-------+  Subclavian     Full        Yes        Yes                         +----------+------------+---------+-----------+----------+-------+  Left Findings: +----------+------------+---------+-----------+----------+-------+  LEFT       Compressible Phasicity Spontaneous Properties Summary  +----------+------------+---------+-----------+----------+-------+  IJV            Full        Yes        Yes                         +----------+------------+---------+-----------+----------+-------+  Subclavian     Full        Yes        Yes                         +----------+------------+---------+-----------+----------+-------+  Axillary       Full        Yes        Yes                         +----------+------------+---------+-----------+----------+-------+  Brachial       Full        Yes        Yes                         +----------+------------+---------+-----------+----------+-------+  Radial         Full                                               +----------+------------+---------+-----------+----------+-------+  Ulnar          Full                                               +----------+------------+---------+-----------+----------+-------+  Cephalic       Full                                               +----------+------------+---------+-----------+----------+-------+  Basilic        Full                                                +----------+------------+---------+-----------+----------+-------+  Summary:  Right: No evidence of thrombosis in the subclavian.  Left: No evidence of deep vein thrombosis in the upper extremity. No evidence of superficial vein thrombosis in the upper extremity.  *See table(s) above for measurements and observations.  Diagnosing physician: Harold Barban MD Electronically signed by Harold Barban MD on 01/19/2021 at 6:40:54 PM.    Final    VAS [place] UPPER EXTREMITY VENOUS DUPLEX  Result Date: 01/13/2021 UPPER VENOUS STUDY  Patient Name:  Jillian Leblanc  Date of Exam:   01/13/2021 Medical Rec #: [id number]       Accession #:    [id number] Date of Birth: [birth year]       Patient Gender: F Patient Age:   74 years Exam Location:  [facility] Procedure:      VAS [place] UPPER EXTREMITY VENOUS DUPLEX Referring Phys: CHING TU --------------------------------------------------------------------------------  Indications: Pain, and Swelling Risk Factors: Surgery 01-04-2021 Lumbar. Limitations: Multiple bandages, wounds, and patient pain tolerance. Comparison       01-01-2021 Prior left upper extremity venous was negative for Study:           DVT. Performing Technologist: Darlin Coco RDMS, RVT  Examination Guidelines: A complete evaluation includes B-mode imaging, spectral Doppler, color Doppler, and power Doppler as needed of all accessible portions of each vessel. Bilateral testing is considered an integral part of a complete examination. Limited examinations for reoccurring indications may be performed as noted.  Right Findings: +----------+------------+---------+-----------+----------+-------+  RIGHT      Compressible Phasicity Spontaneous Properties Summary  +----------+------------+---------+-----------+----------+-------+  Subclavian                 Yes        Yes                         +----------+------------+---------+-----------+----------+-------+  Left Findings:  +----------+------------+---------+-----------+----------+---------------------+  LEFT       Compressible Phasicity Spontaneous Properties        Summary         +----------+------------+---------+-----------+----------+---------------------+  IJV            Full        Yes        Yes                                       +----------+------------+---------+-----------+----------+---------------------+  Subclavian                 Yes        Yes                                       +----------+------------+---------+-----------+----------+---------------------+  Axillary       Full        Yes        Yes                                       +----------+------------+---------+-----------+----------+---------------------+  Brachial       Full        Yes        Yes                  Some segments not                                                               well visualized due                                                             to bandaging and pain  +----------+------------+---------+-----------+----------+---------------------+  Radial                     Yes        Yes                 Unable to compress                                                                   due to pain       +----------+------------+---------+-----------+----------+---------------------+  Ulnar                      Yes        Yes                 Unable to compress                                                                   due to pain       +----------+------------+---------+-----------+----------+---------------------+  Cephalic       None        No         No                         Acute          +----------+------------+---------+-----------+----------+---------------------+  Basilic                    Yes        Yes                 Unable to compress  due to pain       +----------+------------+---------+-----------+----------+---------------------+   Summary:  Right: No evidence of thrombosis in the subclavian.  Left: No evidence of deep vein thrombosis in the upper extremity; however, portions of today's examination were limited. See technologist comments above. Findings consistent with acute superficial vein thrombosis involving the left cephalic vein at the level of the forearm.  *See table(s) above for measurements and observations.  Diagnosing physician: Orlie Pollen Electronically signed by Orlie Pollen on 01/13/2021 at 3:49:35 PM.    Final    VAS [place] UPPER EXTREMITY VENOUS DUPLEX  Result Date: 01/03/2021 UPPER VENOUS STUDY  Patient Name:  Jillian Leblanc  Date of Exam:   01/01/2021 Medical Rec #: [id number]       Accession #:    [id number] Date of Birth: July 24, 1945       Patient Gender: F Patient Age:   40 years Exam Location:  [facility] Procedure:      VAS [place] UPPER EXTREMITY VENOUS DUPLEX Referring Phys: Oren Binet --------------------------------------------------------------------------------  Indications: Swelling Limitations: Bandages. Comparison Study: no prior Performing Technologist: Archie Patten RVS  Examination Guidelines: A complete evaluation includes B-mode imaging, spectral Doppler, color Doppler, and power Doppler as needed of all accessible portions of each vessel. Bilateral testing is considered an integral part of a complete examination. Limited examinations for reoccurring indications may be performed as noted.  Right Findings: +----------+------------+---------+-----------+----------+-------+  RIGHT      Compressible Phasicity Spontaneous Properties Summary  +----------+------------+---------+-----------+----------+-------+  Subclavian                 Yes        Yes                         +----------+------------+---------+-----------+----------+-------+  Left Findings: +----------+------------+---------+-----------+----------+-------+  LEFT       Compressible Phasicity Spontaneous Properties Summary   +----------+------------+---------+-----------+----------+-------+  IJV            Full        Yes        Yes                         +----------+------------+---------+-----------+----------+-------+  Subclavian     Full        Yes        Yes                         +----------+------------+---------+-----------+----------+-------+  Axillary       Full        Yes        Yes                         +----------+------------+---------+-----------+----------+-------+  Brachial       Full        Yes        Yes                         +----------+------------+---------+-----------+----------+-------+  Radial         Full                                               +----------+------------+---------+-----------+----------+-------+  Ulnar          Full                                               +----------+------------+---------+-----------+----------+-------+  Cephalic       Full                                               +----------+------------+---------+-----------+----------+-------+  Summary:  Right: No evidence of thrombosis in the subclavian.  Left: No evidence of deep vein thrombosis in the upper extremity. No evidence of superficial vein thrombosis in the upper extremity.  *See table(s) above for measurements and observations.  Diagnosing physician: Harold Barban MD Electronically signed by Harold Barban MD on 01/03/2021 at 7:41:50 PM.    Final    [place] EKG SITE RITE  Result Date: 01/07/2021 If Site Rite image not attached, placement could not be confirmed due to current cardiac rhythm.    Subjective: No fever, chills, or new pain. Slept well, amenable to discharge today.   Discharge Exam: Vitals:   01/29/21 0603 01/29/21 0905  BP: (!) 140/92 (!) 127/93  Pulse: (!) 107 (!) 107  Resp: 18 18  Temp: 99.9 F (37.7 C) 99 F (37.2 C)  SpO2: 99% 99%   General: Pt is alert, awake, not in acute distress, chronically ill-appearing Cardiovascular: Regular paced, no JVD Respiratory: Nonlabored  and clear. Abdominal: Soft, NT, ND, +BS Extremities: No pitting edema, no cyanosis Skin: Midline back incision has serous drainage without odor or exudate, no significant spreading erythema or significant tenderness.   Labs: BNP (last 3 results) Recent Labs    12/19/20 0237  BNP 831.5*   Basic Metabolic Panel: Recent Labs  Lab 01/27/21 1855 01/28/21 0643  NA 127* 135  K 4.2 3.4*  CL 99 108  CO2 21* 20*  GLUCOSE 130* 101*  BUN 8 7*  CREATININE 0.69 0.62  CALCIUM 6.4* 6.8*   Liver Function Tests: Recent Labs  Lab 01/27/21 1855 01/28/21 0643  AST 20 14*  ALT 5 6  ALKPHOS 217* 182*  BILITOT 0.6 0.6  PROT 5.9* 5.9*  ALBUMIN 1.6* 2.2*   No results for input(s): LIPASE, AMYLASE in the last 168 hours. No results for input(s): AMMONIA in the last 168 hours. CBC: Recent Labs  Lab 01/27/21 1855 01/28/21 0643 01/29/21 0657  WBC 6.7 3.8* 6.0  NEUTROABS 4.0  --  3.3  HGB 8.3* 7.7* 9.6*  HCT 26.5* 25.0* 28.6*  MCV 93.6 96.2 89.7  PLT 303 248 331   Cardiac Enzymes: No results for input(s): CKTOTAL, CKMB, CKMBINDEX, TROPONINI in the last 168 hours. BNP: Invalid input(s): POCBNP CBG: Recent Labs  Lab 01/28/21 1707 01/28/21 1732 01/28/21 1825 01/28/21 2158 01/29/21 0642  GLUCAP 69* 61* 185* 196* 115*   D-Dimer No results for input(s): DDIMER in the last 72 hours. Hgb A1c Recent Labs    01/28/21 0630  HGBA1C 5.5   Lipid Profile No results for input(s): CHOL, HDL, LDLCALC, TRIG, CHOLHDL, LDLDIRECT in the last 72 hours. Thyroid function studies No results for input(s): TSH, T4TOTAL, T3FREE, THYROIDAB in the last 72 hours.  Invalid input(s): FREET3 Anemia work up No results for input(s): VITAMINB12, FOLATE, FERRITIN, TIBC, IRON, RETICCTPCT in the last 72 hours. Urinalysis    Component Value Date/Time   COLORURINE YELLOW 01/27/2021 2228   APPEARANCEUR CLOUDY (A) 01/27/2021 2228   LABSPEC 1.000 (L) 01/27/2021 2228   PHURINE 7.0 01/27/2021 2228    GLUCOSEU >=500 (A) 01/27/2021 2228   HGBUR SMALL (A) 01/27/2021 2228   BILIRUBINUR NEGATIVE 01/27/2021 2228   Benjamin Stain  NEGATIVE 01/27/2021 2228   PROTEINUR NEGATIVE 01/27/2021 2228   UROBILINOGEN 0.2 09/15/2012 1536   NITRITE NEGATIVE 01/27/2021 2228   LEUKOCYTESUR LARGE (A) 01/27/2021 2228    Microbiology Recent Results (from the past 240 hour(s))  Culture, blood (routine x 2)     Status: None (Preliminary result)   Collection Time: 01/27/21  6:55 PM   Specimen: BLOOD  Result Value Ref Range Status   Specimen Description BLOOD SITE NOT SPECIFIED  Final   Special Requests   Final    BOTTLES DRAWN AEROBIC AND ANAEROBIC Blood Culture results may not be optimal due to an inadequate volume of blood received in culture bottles   Culture   Final    NO GROWTH 2 DAYS Performed at [facility], [place] [address] [place], [place] [address]    Report Status PENDING  Incomplete  Urine Culture     Status: Abnormal   Collection Time: 01/27/21 10:00 PM   Specimen: Urine, Catheterized  Result Value Ref Range Status   Specimen Description URINE, CATHETERIZED  Final   Special Requests   Final    NONE Performed at [facility], [place] [address], [place], [place] [address]    Culture >=100,000 COLONIES/mL YEAST (A)  Final   Report Status 01/29/2021 FINAL  Final  Culture, blood (routine x 2)     Status: None (Preliminary result)   Collection Time: 01/27/21 10:12 PM   Specimen: BLOOD  Result Value Ref Range Status   Specimen Description BLOOD SITE NOT SPECIFIED  Final   Special Requests   Final    BOTTLES DRAWN AEROBIC AND ANAEROBIC Blood Culture adequate volume   Culture   Final    NO GROWTH 2 DAYS Performed at [facility], [place] [address] [place], [place] [address]    Report Status PENDING  Incomplete  Resp Panel by RT-PCR (Flu A&B, Covid) Nasopharyngeal Swab     Status: None   Collection Time: 01/28/21 12:07 PM   Specimen: Nasopharyngeal Swab; Nasopharyngeal(NP) swabs in  vial transport medium  Result Value Ref Range Status   SARS Coronavirus 2 by RT PCR NEGATIVE NEGATIVE Final    Comment: (NOTE) SARS-CoV-2 target nucleic acids are NOT DETECTED.  The SARS-CoV-2 RNA is generally detectable in upper respiratory specimens during the acute phase of infection. The lowest concentration of SARS-CoV-2 viral copies this assay can detect is 138 copies/mL. A negative result does not preclude SARS-Cov-2 infection and should not be used as the sole basis for treatment or other patient management decisions. A negative result may occur with  improper specimen collection/handling, submission of specimen other than nasopharyngeal swab, presence of viral mutation(s) within the areas targeted by this assay, and inadequate number of viral copies(<138 copies/mL). A negative result must be combined with clinical observations, patient history, and epidemiological information. The expected result is Negative.  Fact Sheet for Patients:  EntrepreneurPulse.com.au  Fact Sheet for Healthcare Providers:  IncredibleEmployment.be  This test is no t yet approved or cleared by the [place] FDA and  has been authorized for detection and/or diagnosis of SARS-CoV-2 by FDA under an Emergency Use Authorization (EUA). This EUA will remain  in effect (meaning this test can be used) for the duration of the COVID-19 declaration under Section 564(b)(1) of the Act, 21 U.S.C.section 360bbb-3(b)(1), unless the authorization is terminated  or revoked sooner.       Influenza A by PCR NEGATIVE NEGATIVE Final   Influenza B by PCR NEGATIVE NEGATIVE  Final    Comment: (NOTE) The Xpert Xpress SARS-CoV-2/FLU/RSV plus assay is intended as an aid in the diagnosis of influenza from Nasopharyngeal swab specimens and should not be used as a sole basis for treatment. Nasal washings and aspirates are unacceptable for Xpert Xpress SARS-CoV-2/FLU/RSV testing.  Fact  Sheet for Patients: EntrepreneurPulse.com.au  Fact Sheet for Healthcare Providers: IncredibleEmployment.be  This test is not yet approved or cleared by the [place] FDA and has been authorized for detection and/or diagnosis of SARS-CoV-2 by FDA under an Emergency Use Authorization (EUA). This EUA will remain in effect (meaning this test can be used) for the duration of the COVID-19 declaration under Section 564(b)(1) of the Act, 21 U.S.C. section 360bbb-3(b)(1), unless the authorization is terminated or revoked.  Performed at [facility], [place] [address] [place], [place] [address]     Time coordinating discharge: Approximately 40 minutes  Patrecia Pour, MD  [facility] 01/29/2021, 11:05 AM

## 2021-02-01 LAB — CULTURE, BLOOD (ROUTINE X 2)
Culture: NO GROWTH
Culture: NO GROWTH
Special Requests: ADEQUATE

## 2021-02-03 NOTE — Telephone Encounter (Signed)
Left message to call back  

## 2021-02-15 ENCOUNTER — Ambulatory Visit (INDEPENDENT_AMBULATORY_CARE_PROVIDER_SITE_OTHER): Payer: Medicare Other

## 2021-02-15 DIAGNOSIS — I495 Sick sinus syndrome: Secondary | ICD-10-CM

## 2021-02-15 LAB — CUP PACEART REMOTE DEVICE CHECK
Battery Remaining Longevity: 161 mo
Battery Voltage: 3.05 V
Brady Statistic AP VP Percent: 0.02 %
Brady Statistic AP VS Percent: 7.61 %
Brady Statistic AS VP Percent: 0.02 %
Brady Statistic AS VS Percent: 92.35 %
Brady Statistic RA Percent Paced: 7.47 %
Brady Statistic RV Percent Paced: 0.04 %
Date Time Interrogation Session: 20230122234815
Implantable Lead Implant Date: 20210723
Implantable Lead Implant Date: 20210723
Implantable Lead Location: 753859
Implantable Lead Location: 753860
Implantable Lead Model: 5076
Implantable Lead Model: 5076
Implantable Pulse Generator Implant Date: 20210723
Lead Channel Impedance Value: 304 Ohm
Lead Channel Impedance Value: 323 Ohm
Lead Channel Impedance Value: 342 Ohm
Lead Channel Impedance Value: 380 Ohm
Lead Channel Pacing Threshold Amplitude: 0.5 V
Lead Channel Pacing Threshold Amplitude: 0.75 V
Lead Channel Pacing Threshold Pulse Width: 0.4 ms
Lead Channel Pacing Threshold Pulse Width: 0.4 ms
Lead Channel Sensing Intrinsic Amplitude: 3.125 mV
Lead Channel Sensing Intrinsic Amplitude: 3.125 mV
Lead Channel Sensing Intrinsic Amplitude: 9 mV
Lead Channel Sensing Intrinsic Amplitude: 9 mV
Lead Channel Setting Pacing Amplitude: 1.5 V
Lead Channel Setting Pacing Amplitude: 2.5 V
Lead Channel Setting Pacing Pulse Width: 0.4 ms
Lead Channel Setting Sensing Sensitivity: 0.9 mV

## 2021-02-22 ENCOUNTER — Telehealth: Payer: Self-pay

## 2021-02-22 NOTE — Telephone Encounter (Signed)
Heather, home health RN called requesting Pull PICC orders on patient - last dose of IV abx on 03/01/2021. Patient canceled last appointment at our office. I tried to call patient home and mobile number to schedule follow up - no answer. Heather, RN stated that patient is barely able to get out of the bed and she is unsure if patient would be able to make it to an appointment. Nira Conn also stated that she was unable to get blood work done on patient today due to patient being possibly dehydrated. Heather encouraged patient to drink more fluids. Please advise.    [place], CMA

## 2021-02-23 NOTE — Telephone Encounter (Addendum)
Spoke to [employer], informed her of Dr.Singh advising patient to go to the ER due to immobility and possible dehydration. Herbert Seta stated she seen patient today and was able to get blood work on her. Herbert Seta stated that she would try to get in contact with patient daughter [name] and inform her of Dr.Singh recommendation. [name] to be done with IV abx on 2/6. Informed Heather - Dr.Singh agreed to virtual visit with patient before 2/6 but patient really needs to go ER. Heather voiced her understanding and stated she would let us know if she gets in contact with patient daughter. Herbert Seta, RN with advance call back number is [phone]    Marcell Anger, CMA

## 2021-02-23 NOTE — Telephone Encounter (Signed)
Contacted patient daughter Alanda Slim - left voicemail asking to return my call.

## 2021-02-23 NOTE — Telephone Encounter (Signed)
Patient daughter [name] returned my call. Informed her of Dr.Singh advising patient to go the ER based off her symptoms. [name] stated that she would try to talk to her mother into going but she wasn't sure if she would. I voiced my understanding and will relay message to Dr.Singh.    Serina Nichter Lesli Albee, CMA

## 2021-02-24 ENCOUNTER — Other Ambulatory Visit: Payer: Self-pay

## 2021-02-24 ENCOUNTER — Inpatient Hospital Stay (HOSPITAL_BASED_OUTPATIENT_CLINIC_OR_DEPARTMENT_OTHER)
Admission: EM | Admit: 2021-02-24 | Discharge: 2021-03-24 | DRG: 698 | Disposition: E | Payer: Medicare Other | Attending: Internal Medicine | Admitting: Internal Medicine

## 2021-02-24 ENCOUNTER — Encounter (HOSPITAL_BASED_OUTPATIENT_CLINIC_OR_DEPARTMENT_OTHER): Payer: Self-pay | Admitting: Emergency Medicine

## 2021-02-24 ENCOUNTER — Emergency Department (HOSPITAL_BASED_OUTPATIENT_CLINIC_OR_DEPARTMENT_OTHER): Payer: Medicare Other

## 2021-02-24 DIAGNOSIS — L8990 Pressure ulcer of unspecified site, unspecified stage: Secondary | ICD-10-CM | POA: Diagnosis present

## 2021-02-24 DIAGNOSIS — G9340 Encephalopathy, unspecified: Secondary | ICD-10-CM | POA: Diagnosis present

## 2021-02-24 DIAGNOSIS — E11649 Type 2 diabetes mellitus with hypoglycemia without coma: Secondary | ICD-10-CM | POA: Diagnosis present

## 2021-02-24 DIAGNOSIS — D709 Neutropenia, unspecified: Secondary | ICD-10-CM | POA: Diagnosis present

## 2021-02-24 DIAGNOSIS — I495 Sick sinus syndrome: Secondary | ICD-10-CM | POA: Diagnosis present

## 2021-02-24 DIAGNOSIS — T383X5A Adverse effect of insulin and oral hypoglycemic [antidiabetic] drugs, initial encounter: Secondary | ICD-10-CM | POA: Diagnosis present

## 2021-02-24 DIAGNOSIS — B965 Pseudomonas (aeruginosa) (mallei) (pseudomallei) as the cause of diseases classified elsewhere: Secondary | ICD-10-CM

## 2021-02-24 DIAGNOSIS — E871 Hypo-osmolality and hyponatremia: Secondary | ICD-10-CM | POA: Diagnosis not present

## 2021-02-24 DIAGNOSIS — Z6833 Body mass index (BMI) 33.0-33.9, adult: Secondary | ICD-10-CM

## 2021-02-24 DIAGNOSIS — Y846 Urinary catheterization as the cause of abnormal reaction of the patient, or of later complication, without mention of misadventure at the time of the procedure: Secondary | ICD-10-CM | POA: Diagnosis present

## 2021-02-24 DIAGNOSIS — A4152 Sepsis due to Pseudomonas: Principal | ICD-10-CM | POA: Diagnosis present

## 2021-02-24 DIAGNOSIS — Z90710 Acquired absence of both cervix and uterus: Secondary | ICD-10-CM

## 2021-02-24 DIAGNOSIS — T83511A Infection and inflammatory reaction due to indwelling urethral catheter, initial encounter: Secondary | ICD-10-CM | POA: Diagnosis not present

## 2021-02-24 DIAGNOSIS — Z9981 Dependence on supplemental oxygen: Secondary | ICD-10-CM

## 2021-02-24 DIAGNOSIS — R627 Adult failure to thrive: Secondary | ICD-10-CM | POA: Diagnosis present

## 2021-02-24 DIAGNOSIS — N390 Urinary tract infection, site not specified: Secondary | ICD-10-CM | POA: Diagnosis present

## 2021-02-24 DIAGNOSIS — E890 Postprocedural hypothyroidism: Secondary | ICD-10-CM | POA: Diagnosis present

## 2021-02-24 DIAGNOSIS — E1169 Type 2 diabetes mellitus with other specified complication: Secondary | ICD-10-CM | POA: Diagnosis present

## 2021-02-24 DIAGNOSIS — Z0189 Encounter for other specified special examinations: Secondary | ICD-10-CM

## 2021-02-24 DIAGNOSIS — M4646 Discitis, unspecified, lumbar region: Secondary | ICD-10-CM | POA: Diagnosis present

## 2021-02-24 DIAGNOSIS — Z515 Encounter for palliative care: Secondary | ICD-10-CM

## 2021-02-24 DIAGNOSIS — Z7984 Long term (current) use of oral hypoglycemic drugs: Secondary | ICD-10-CM

## 2021-02-24 DIAGNOSIS — Z20822 Contact with and (suspected) exposure to covid-19: Secondary | ICD-10-CM | POA: Diagnosis present

## 2021-02-24 DIAGNOSIS — E162 Hypoglycemia, unspecified: Secondary | ICD-10-CM

## 2021-02-24 DIAGNOSIS — G9341 Metabolic encephalopathy: Secondary | ICD-10-CM

## 2021-02-24 DIAGNOSIS — L89152 Pressure ulcer of sacral region, stage 2: Secondary | ICD-10-CM | POA: Diagnosis present

## 2021-02-24 DIAGNOSIS — R5381 Other malaise: Secondary | ICD-10-CM | POA: Diagnosis present

## 2021-02-24 DIAGNOSIS — I472 Ventricular tachycardia, unspecified: Secondary | ICD-10-CM | POA: Diagnosis not present

## 2021-02-24 DIAGNOSIS — R6521 Severe sepsis with septic shock: Secondary | ICD-10-CM | POA: Diagnosis present

## 2021-02-24 DIAGNOSIS — E669 Obesity, unspecified: Secondary | ICD-10-CM | POA: Diagnosis present

## 2021-02-24 DIAGNOSIS — J80 Acute respiratory distress syndrome: Secondary | ICD-10-CM | POA: Diagnosis not present

## 2021-02-24 DIAGNOSIS — G8929 Other chronic pain: Secondary | ICD-10-CM | POA: Diagnosis present

## 2021-02-24 DIAGNOSIS — Z79890 Hormone replacement therapy (postmenopausal): Secondary | ICD-10-CM

## 2021-02-24 DIAGNOSIS — Z978 Presence of other specified devices: Secondary | ICD-10-CM

## 2021-02-24 DIAGNOSIS — I5032 Chronic diastolic (congestive) heart failure: Secondary | ICD-10-CM | POA: Diagnosis present

## 2021-02-24 DIAGNOSIS — R7881 Bacteremia: Secondary | ICD-10-CM

## 2021-02-24 DIAGNOSIS — E872 Acidosis, unspecified: Secondary | ICD-10-CM | POA: Diagnosis present

## 2021-02-24 DIAGNOSIS — Z66 Do not resuscitate: Secondary | ICD-10-CM | POA: Diagnosis not present

## 2021-02-24 DIAGNOSIS — I4901 Ventricular fibrillation: Secondary | ICD-10-CM | POA: Diagnosis not present

## 2021-02-24 DIAGNOSIS — I469 Cardiac arrest, cause unspecified: Secondary | ICD-10-CM | POA: Diagnosis not present

## 2021-02-24 DIAGNOSIS — Z950 Presence of cardiac pacemaker: Secondary | ICD-10-CM

## 2021-02-24 DIAGNOSIS — Z7401 Bed confinement status: Secondary | ICD-10-CM

## 2021-02-24 DIAGNOSIS — Z96611 Presence of right artificial shoulder joint: Secondary | ICD-10-CM | POA: Diagnosis present

## 2021-02-24 DIAGNOSIS — Z7951 Long term (current) use of inhaled steroids: Secondary | ICD-10-CM

## 2021-02-24 DIAGNOSIS — L89102 Pressure ulcer of unspecified part of back, stage 2: Secondary | ICD-10-CM | POA: Diagnosis present

## 2021-02-24 DIAGNOSIS — R54 Age-related physical debility: Secondary | ICD-10-CM | POA: Diagnosis present

## 2021-02-24 DIAGNOSIS — E860 Dehydration: Secondary | ICD-10-CM | POA: Diagnosis present

## 2021-02-24 DIAGNOSIS — B9620 Unspecified Escherichia coli [E. coli] as the cause of diseases classified elsewhere: Secondary | ICD-10-CM | POA: Diagnosis present

## 2021-02-24 DIAGNOSIS — Z79899 Other long term (current) drug therapy: Secondary | ICD-10-CM

## 2021-02-24 LAB — URINALYSIS, MICROSCOPIC (REFLEX): WBC, UA: >50 WBC/hpf (ref 0–5)

## 2021-02-24 LAB — COMPREHENSIVE METABOLIC PANEL
ALT: <5 U/L (ref 0–44)
AST: 21 U/L (ref 15–41)
Albumin: 2.4 g/dL — ABNORMAL LOW (ref 3.5–5.0)
Alkaline Phosphatase: 199 U/L — ABNORMAL HIGH (ref 38–126)
Anion gap: 8 (ref 5–15)
BUN: 12 mg/dL (ref 8–23)
CO2: 24 mmol/L (ref 22–32)
Calcium: 7.6 mg/dL — ABNORMAL LOW (ref 8.9–10.3)
Chloride: 96 mmol/L — ABNORMAL LOW (ref 98–111)
Creatinine, Ser: 0.95 mg/dL (ref 0.44–1.00)
GFR, Estimated: >60 mL/min (ref >60)
Glucose, Bld: 55 mg/dL — ABNORMAL LOW (ref 70–99)
Potassium: 4.4 mmol/L (ref 3.5–5.1)
Sodium: 128 mmol/L — ABNORMAL LOW (ref 135–145)
Total Bilirubin: 0.6 mg/dL (ref 0.3–1.2)
Total Protein: 7.3 g/dL (ref 6.5–8.1)

## 2021-02-24 LAB — CBC WITH DIFFERENTIAL/PLATELET
Abs Immature Granulocytes: 0.07 10*3/uL (ref 0.00–0.07)
Basophils Absolute: 0 10*3/uL (ref 0.0–0.1)
Basophils Relative: 0 %
Eosinophils Absolute: 0 10*3/uL (ref 0.0–0.5)
Eosinophils Relative: 0 %
HCT: 33.1 % — ABNORMAL LOW (ref 36.0–46.0)
Hemoglobin: 11 g/dL — ABNORMAL LOW (ref 12.0–15.0)
Immature Granulocytes: 1 %
Lymphocytes Relative: 8 %
Lymphs Abs: 1.1 10*3/uL (ref 0.7–4.0)
MCH: 30.6 pg (ref 26.0–34.0)
MCHC: 33.2 g/dL (ref 30.0–36.0)
MCV: 91.9 fL (ref 80.0–100.0)
Monocytes Absolute: 0.7 10*3/uL (ref 0.1–1.0)
Monocytes Relative: 5 %
Neutro Abs: 11.5 10*3/uL — ABNORMAL HIGH (ref 1.7–7.7)
Neutrophils Relative %: 86 %
Platelets: 327 10*3/uL (ref 150–400)
RBC: 3.6 MIL/uL — ABNORMAL LOW (ref 3.87–5.11)
RDW: 17.4 % — ABNORMAL HIGH (ref 11.5–15.5)
WBC: 13.4 10*3/uL — ABNORMAL HIGH (ref 4.0–10.5)
nRBC: 0 % (ref 0.0–0.2)

## 2021-02-24 LAB — URINALYSIS, ROUTINE W REFLEX MICROSCOPIC
Bilirubin Urine: NEGATIVE
Glucose, UA: 100 mg/dL — AB
Ketones, ur: NEGATIVE mg/dL
Nitrite: NEGATIVE
Protein, ur: 30 mg/dL — AB
Specific Gravity, Urine: 1.01 (ref 1.005–1.030)
pH: 6 (ref 5.0–8.0)

## 2021-02-24 LAB — RESP PANEL BY RT-PCR (FLU A&B, COVID) ARPGX2
Influenza A by PCR: NEGATIVE
Influenza B by PCR: NEGATIVE
SARS Coronavirus 2 by RT PCR: NEGATIVE

## 2021-02-24 LAB — CBG MONITORING, ED: Glucose-Capillary: 60 mg/dL — ABNORMAL LOW (ref 70–99)

## 2021-02-24 LAB — LACTIC ACID, PLASMA: Lactic Acid, Venous: 1.7 mmol/L (ref 0.5–1.9)

## 2021-02-24 LAB — AMMONIA: Ammonia: 13 umol/L (ref 9–35)

## 2021-02-24 MED ORDER — DEXTROSE-NACL 5-0.45 % IV SOLN: INTRAVENOUS | Status: DC

## 2021-02-24 MED ORDER — DEXTROSE 50 % IV SOLN
1.0000 | Freq: Once | INTRAVENOUS | Status: AC
Start: 2021-02-24 — End: 2021-02-24
  Administered 2021-02-24: 50 mL via INTRAVENOUS
  Filled 2021-02-24: qty 50

## 2021-02-24 MED ORDER — LACTATED RINGERS IV BOLUS
1000.0000 mL | Freq: Once | INTRAVENOUS | Status: AC
  Administered 2021-02-24: 1000 mL via INTRAVENOUS

## 2021-02-24 MED ORDER — DEXTROSE-NACL 10-0.45 % IV SOLN: INTRAVENOUS | Status: DC

## 2021-02-24 NOTE — ED Notes (Signed)
Upon moving left arm to place BP cuff, pt states her left arm hurts, states had a fall 2 weeks ago and injured arm.

## 2021-02-24 NOTE — ED Provider Notes (Signed)
[facility] Provider Note   CSN: [id number] Arrival date & time: 03/14/2021  1840     History  Chief Complaint  Patient presents with   Dehydration    Jillian Leblanc is a 76 y.o. female.  Pt is a 76y/o female with hx of  sick sinus s/p pacemaker, DM, chronic HFpEF, hypothyroidism, E. coli bacteremia, epidural abscess and vertebral osteomyelitis still on ancef via PICC live till 03/01/21 s/p decompression and drainage, indwelling foley catheter with recent admit for pseudomonas UTI s/p 7 days cefepime who has been at her home with home health care who is presenting today from home due to being unable to get out of bed, poor appetite.  [name] able to answer questions here but occasionally seems mildly confused.  She denies any abdominal pain, chest pain, shortness of breath.  She states she is trying to eat but she does not eat well.  She is also complained of having diarrhea.  She is still currently receiving Ancef.  Home health nurse called her doctor because of her concerns and they recommended she go to the emergency room.  Patient gives no further information at this time.  We will attempt to call patient's daughter.  The history is provided by the patient and medical records.      Home Medications Prior to Admission medications   Medication Sig Start Date End Date Taking? Authorizing Provider  acetaminophen (TYLENOL) 500 MG tablet Take 2 tablets (1,000 mg total) by mouth every 8 (eight) hours as needed. Patient taking differently: Take 1,000 mg by mouth every 8 (eight) hours as needed (for pain- not to exceed 3-4 grams total from all combined sources/24 hours). 01/08/21   Ghimire, Henreitta Leber, MD  calcium-vitamin D 250-100 MG-UNIT tablet Take 1 tablet by mouth 2 (two) times daily. 09/14/20   Palumbo, April, MD  ceFAZolin (ANCEF) IVPB Inject 2 g into the vein every 8 (eight) hours. Indication: E.coli epidural abscess/osteo First Dose: Yes Last Day of  Therapy:  03/01/21 Labs - Once weekly:  CBC/D and BMP, Labs - Every other week:  ESR and CRP Method of administration: IV Push Method of administration may be changed at the discretion of home infusion pharmacist based upon assessment of the patient and/or caregiver's ability to self-administer the medication ordered. 01/08/21 03/03/21  Ghimire, Henreitta Leber, MD  folic acid (FOLVITE) 1 MG tablet Take 1 tablet (1 mg total) by mouth daily. 01/23/21   Regalado, Belkys A, MD  furosemide (LASIX) 40 MG tablet Take 1 tablet (40 mg total) by mouth daily. 01/08/21   Ghimire, Henreitta Leber, MD  gabapentin (NEURONTIN) 300 MG capsule Take 1 capsule (300 mg total) by mouth 2 (two) times daily. Patient taking differently: Take 300 mg by mouth in the morning and at bedtime. 12/08/20   Nita Sells, MD  ipratropium-albuterol (DUONEB) 0.5-2.5 (3) MG/3ML SOLN Take 3 mLs by nebulization every 8 (eight) hours. 01/08/21   Ghimire, Henreitta Leber, MD  levothyroxine (SYNTHROID) 150 MCG tablet Take 150 mcg by mouth daily before breakfast. 09/01/20   [provider]  lidocaine (LIDODERM) 5 % Place 1 patch onto the skin daily. Remove & Discard patch within 12 hours or as directed by MD Patient taking differently: Place 1 patch onto the skin See admin instructions. Apply 1 patch topically daily and remove at bedtime- (Remove & Discard patch within 12 hours or as directed by MD) 01/09/21   Ghimire, Henreitta Leber, MD  magnesium oxide (MAG-OX) 400 (240 Mg)  MG tablet Take 1 tablet (400 mg total) by mouth 2 (two) times daily. 01/22/21   Regalado, Belkys A, MD  oxyCODONE (OXY IR/ROXICODONE) 5 MG immediate release tablet Take 1-2 tablets (5-10 mg total) by mouth every 6 (six) hours as needed for moderate pain or severe pain. 01/08/21   Ghimire, Henreitta Leber, MD  OXYGEN Inhale 2 L/min into the lungs continuous.    [provider]  pantoprazole (PROTONIX) 40 MG tablet Take 1 tablet (40 mg total) by mouth daily. Patient taking  differently: Take 40 mg by mouth in the morning. 01/09/21   Ghimire, Henreitta Leber, MD  polyethylene glycol (MIRALAX / GLYCOLAX) 17 g packet Take 17 g by mouth daily as needed. Patient taking differently: Take 17 g by mouth daily as needed for mild constipation. 01/08/21   Ghimire, Henreitta Leber, MD  potassium chloride (KLOR-CON) 20 MEQ packet Take 40 mEq by mouth daily. 01/23/21   Regalado, Belkys A, MD  pramipexole (MIRAPEX) 1.5 MG tablet Take 1.5 mg by mouth 3 (three) times daily. 06/29/20   [provider]  QUEtiapine (SEROQUEL) 25 MG tablet Take 1 tablet (25 mg total) by mouth at bedtime. 01/08/21   Ghimire, Henreitta Leber, MD  senna-docusate (SENOKOT-S) 8.6-50 MG tablet Take 1 tablet by mouth 2 (two) times daily. 01/22/21   Regalado, Belkys A, MD  sodium chloride 1 g tablet Take 1 tablet (1 g total) by mouth 2 (two) times daily with a meal. 01/08/21   Ghimire, Henreitta Leber, MD  traZODone (DESYREL) 50 MG tablet Take 1 tablet (50 mg total) by mouth at bedtime as needed for sleep. 01/22/21   Regalado, Belkys A, MD  vitamin B-12 1000 MCG tablet Take 1 tablet (1,000 mcg total) by mouth daily. 01/23/21   Regalado, Jerald Kief A, MD      Allergies    Pentosan polysulfate sodium, Elmiron [pentosan polysulfate], and Naloxone    Review of Systems   Review of Systems  Physical Exam Updated Vital Signs BP 118/72 (BP Location: Left Arm)    Pulse 72    Temp 98.4 F (36.9 C) (Oral)    Resp (!) 23    Ht 5' (1.524 m)    Wt 77.1 kg    SpO2 98%    BMI 33.20 kg/m  Physical Exam Vitals and nursing note reviewed.  Constitutional:      General: She is not in acute distress.    Appearance: She is well-developed. She is ill-appearing.  HENT:     Head: Normocephalic and atraumatic.     Mouth/Throat:     Mouth: Mucous membranes are dry.  Eyes:     Pupils: Pupils are equal, round, and reactive to light.  Cardiovascular:     Rate and Rhythm: Normal rate and regular rhythm.     Heart sounds: Normal heart sounds. No  murmur heard.   No friction rub.  Pulmonary:     Effort: Pulmonary effort is normal.     Breath sounds: Normal breath sounds. No wheezing or rales.  Abdominal:     General: Bowel sounds are normal. There is no distension.     Palpations: Abdomen is soft.     Tenderness: There is no abdominal tenderness. There is no guarding or rebound.  Musculoskeletal:        General: No tenderness. Normal range of motion.     Cervical back: Normal range of motion and neck supple.     Right lower leg: No edema.     Left  lower leg: No edema.     Comments: No edema  Skin:    General: Skin is warm and dry.     Coloration: Skin is pale.     Findings: No rash.  Neurological:     Mental Status: She is alert.     Cranial Nerves: No cranial nerve deficit.     Sensory: No sensory deficit.     Comments: [name] oriented to person and place but intermittently will stare off and sometimes answers questions inappropriately or they do not make sense.  Mild encephalopathy.  Mild global weakness  Psychiatric:        Behavior: Behavior normal.    ED Results / Procedures / Treatments   Labs (all labs ordered are listed, but only abnormal results are displayed) Labs Reviewed  CBC WITH DIFFERENTIAL/PLATELET - Abnormal; Notable for the following components:      Result Value   WBC 13.4 (*)    RBC 3.60 (*)    Hemoglobin 11.0 (*)    HCT 33.1 (*)    RDW 17.4 (*)    Neutro Abs 11.5 (*)    All other components within normal limits  COMPREHENSIVE METABOLIC PANEL - Abnormal; Notable for the following components:   Sodium 128 (*)    Chloride 96 (*)    Glucose, Bld 55 (*)    Calcium 7.6 (*)    Albumin 2.4 (*)    Alkaline Phosphatase 199 (*)    All other components within normal limits  URINALYSIS, ROUTINE W REFLEX MICROSCOPIC - Abnormal; Notable for the following components:   APPearance CLOUDY (*)    Glucose, UA 100 (*)    Hgb urine dipstick SMALL (*)    Protein, ur 30 (*)    Leukocytes,Ua LARGE (*)     All other components within normal limits  URINALYSIS, MICROSCOPIC (REFLEX) - Abnormal; Notable for the following components:   Bacteria, UA FEW (*)    All other components within normal limits  CBG MONITORING, ED - Abnormal; Notable for the following components:   Glucose-Capillary 60 (*)    All other components within normal limits  RESP PANEL BY RT-PCR (FLU A&B, COVID) ARPGX2  URINE CULTURE  CULTURE, BLOOD (ROUTINE X 2)  CULTURE, BLOOD (ROUTINE X 2)  LACTIC ACID, PLASMA  AMMONIA    EKG EKG Interpretation  Date/Time:  Wednesday February 24 2021 19:40:07 EST Ventricular Rate:  86 PR Interval:  49 QRS Duration: 122 QT Interval:  412 QTC Calculation: 493 R Axis:   -42 Text Interpretation: Sinus rhythm Short PR interval Left atrial enlargement Left bundle branch block No significant change since last tracing Confirmed by Blanchie Dessert [phone]) on 02/25/2021 7:53:36 PM  Radiology CT Head Wo Contrast  Result Date: 03/21/2021 CLINICAL DATA:  Altered mental status EXAM: CT HEAD WITHOUT CONTRAST TECHNIQUE: Contiguous axial images were obtained from the base of the skull through the vertex without intravenous contrast. RADIATION DOSE REDUCTION: This exam was performed according to the departmental dose-optimization program which includes automated exposure control, adjustment of the mA and/or kV according to patient size and/or use of iterative reconstruction technique. COMPARISON:  CT head 01/27/2021 FINDINGS: Brain: No acute intracranial hemorrhage, mass effect, or herniation. No extra-axial fluid collections. No evidence of acute territorial infarct. No hydrocephalus. Mild cortical volume loss. Mild chronic microvascular ischemic changes. Vascular: No hyperdense vessel or unexpected calcification. Skull: Normal. Negative for fracture or focal lesion. Sinuses/Orbits: No acute finding. Other: None. IMPRESSION: Chronic changes with no acute intracranial process  identified. Electronically Signed    By: Ofilia Neas M.D.   On: 03/09/2021 21:00   DG Chest Port 1 View  Result Date: 03/12/2021 CLINICAL DATA:  Dehydration, fell 2 weeks ago, confusion EXAM: PORTABLE CHEST 1 VIEW COMPARISON:  01/27/2021 FINDINGS: Single frontal view of the chest demonstrates stable dual lead pacer and right-sided PICC. Cardiac silhouette is unremarkable. No airspace disease, effusion, or pneumothorax. No acute bony abnormality. IMPRESSION: 1. No acute intrathoracic process. Electronically Signed   By: Randa Ngo M.D.   On: 02/26/2021 21:14    Procedures Procedures    Medications Ordered in ED Medications  dextrose 5 %-0.45 % sodium chloride infusion ( Intravenous New Bag/Given 03/19/2021 2213)  lactated ringers bolus 1,000 mL ( Intravenous Stopped 02/27/2021 2042)  dextrose 50 % solution 50 mL (50 mLs Intravenous Given 03/21/2021 2028)    ED Course/ Medical Decision Making/ A&P                           Medical Decision Making Amount and/or Complexity of Data Reviewed Independent Historian: caregiver and EMS External Data Reviewed: labs and notes. Labs: ordered. Decision-making details documented in ED Course. Radiology: ordered and independent interpretation performed. Decision-making details documented in ED Course. ECG/medicine tests: ordered and independent interpretation performed. Decision-making details documented in ED Course.  Risk Prescription drug management. Decision regarding hospitalization.   [name] an elderly female with multiple medical problems and recent complicated past medical history who is presenting today from home with EMS.  Additional history was obtained from EMS and from external medical records.  EMS reported that they were called out because patient was unable to get labs and they wanted her to be checked out and that patient has no complaints.  [name] not a consistent historian.  She is not complaining of anything specifically here but does appear chronically ill  and has still been receiving Ancef for known past spinal infection.  She also recently had a UTI and bacteremia.  Today patient has borderline blood pressure and has had a prior history of dehydration and reports she has not been eating well.  Feel that we need to ensure that she is not having new AKI, hypoglycemia or electrolyte abnormality.  We will give patient IV fluids.  Foley catheter appears dirty and patient reports it has been a while since its been changed.  We will place a new Foley catheter.  11:02 PM After speaking with the patient's daughter she reports for the last 4 days patient has seemed more confused she is hallucinating and has not been herself.  She has not gotten out of bed and daughter reports she seems to be acting the way she did the last time she had to be hospitalized.  I have independently interpreted patient's images and labs.  Today she has a negative COVID and flu test, Foley catheter was changed and UA does not show significant findings for infection.  CMP with recurrent hyponatremia with a sodium of 128 and hyperglycemia with a sugar of 55 with stable creatinine, leukocytosis of 13,000 and stable hemoglobin.  Lactic acid is within normal limits.  Head CT and chest x-ray without acute findings.  On repeat evaluation after patient received D50 2 hours later recheck sugar and it is back down to 60.  Patient was started on a D5 half-normal saline drip.  Concern for dehydration.  On past hospitalizations the same thing occurred and at that time her insulin  was discontinued.  It is unclear if she has been getting insulin at home again.  This may be adding into why she has been confused.  Given patient is requiring D5 will admit for further care.  Antibiotics were held at this time as patient is still getting Ancef and is not displaying infectious etiology at this time as she has had normal blood pressures, no fever and normal lactic acid.  Findings discussed with the patient and her  daughter Abigail Butts was contacted.  Spoke with hospitalist Dr. Hal Hope.  Patient requiring admission at this time.        Final Clinical Impression(s) / ED Diagnoses Final diagnoses:  Hypoglycemia  Acute metabolic encephalopathy  Hyponatremia    Rx / DC Orders ED Discharge Orders     None         Blanchie Dessert, MD 03/06/2021 2306

## 2021-02-24 NOTE — ED Triage Notes (Signed)
Per EMS daughter wanted her here for lab work.  She feels like she is dehydrated.  Reports home health nurse attempted to get blood work from [facility] yesterday but was unable to get blood return.  Patient denies any complaints.

## 2021-02-25 DIAGNOSIS — I495 Sick sinus syndrome: Secondary | ICD-10-CM | POA: Diagnosis present

## 2021-02-25 DIAGNOSIS — E860 Dehydration: Secondary | ICD-10-CM | POA: Diagnosis present

## 2021-02-25 DIAGNOSIS — R6521 Severe sepsis with septic shock: Secondary | ICD-10-CM | POA: Diagnosis present

## 2021-02-25 DIAGNOSIS — E1169 Type 2 diabetes mellitus with other specified complication: Secondary | ICD-10-CM | POA: Diagnosis present

## 2021-02-25 DIAGNOSIS — R627 Adult failure to thrive: Secondary | ICD-10-CM | POA: Diagnosis present

## 2021-02-25 DIAGNOSIS — E162 Hypoglycemia, unspecified: Secondary | ICD-10-CM | POA: Diagnosis not present

## 2021-02-25 DIAGNOSIS — A4152 Sepsis due to Pseudomonas: Secondary | ICD-10-CM | POA: Diagnosis present

## 2021-02-25 DIAGNOSIS — R579 Shock, unspecified: Secondary | ICD-10-CM | POA: Diagnosis not present

## 2021-02-25 DIAGNOSIS — M4646 Discitis, unspecified, lumbar region: Secondary | ICD-10-CM | POA: Diagnosis not present

## 2021-02-25 DIAGNOSIS — Z978 Presence of other specified devices: Secondary | ICD-10-CM | POA: Diagnosis not present

## 2021-02-25 DIAGNOSIS — T83511A Infection and inflammatory reaction due to indwelling urethral catheter, initial encounter: Secondary | ICD-10-CM | POA: Diagnosis present

## 2021-02-25 DIAGNOSIS — I5032 Chronic diastolic (congestive) heart failure: Secondary | ICD-10-CM

## 2021-02-25 DIAGNOSIS — J80 Acute respiratory distress syndrome: Secondary | ICD-10-CM | POA: Diagnosis not present

## 2021-02-25 DIAGNOSIS — E11649 Type 2 diabetes mellitus with hypoglycemia without coma: Secondary | ICD-10-CM | POA: Diagnosis present

## 2021-02-25 DIAGNOSIS — L89152 Pressure ulcer of sacral region, stage 2: Secondary | ICD-10-CM | POA: Diagnosis present

## 2021-02-25 DIAGNOSIS — R7881 Bacteremia: Secondary | ICD-10-CM | POA: Diagnosis not present

## 2021-02-25 DIAGNOSIS — I472 Ventricular tachycardia, unspecified: Secondary | ICD-10-CM | POA: Diagnosis not present

## 2021-02-25 DIAGNOSIS — N390 Urinary tract infection, site not specified: Secondary | ICD-10-CM | POA: Diagnosis present

## 2021-02-25 DIAGNOSIS — Z9981 Dependence on supplemental oxygen: Secondary | ICD-10-CM | POA: Diagnosis not present

## 2021-02-25 DIAGNOSIS — G9341 Metabolic encephalopathy: Secondary | ICD-10-CM | POA: Diagnosis present

## 2021-02-25 DIAGNOSIS — G8929 Other chronic pain: Secondary | ICD-10-CM

## 2021-02-25 DIAGNOSIS — Z66 Do not resuscitate: Secondary | ICD-10-CM | POA: Diagnosis not present

## 2021-02-25 DIAGNOSIS — E890 Postprocedural hypothyroidism: Secondary | ICD-10-CM | POA: Diagnosis present

## 2021-02-25 DIAGNOSIS — E871 Hypo-osmolality and hyponatremia: Secondary | ICD-10-CM | POA: Diagnosis present

## 2021-02-25 DIAGNOSIS — E872 Acidosis, unspecified: Secondary | ICD-10-CM | POA: Diagnosis present

## 2021-02-25 DIAGNOSIS — Z20822 Contact with and (suspected) exposure to covid-19: Secondary | ICD-10-CM | POA: Diagnosis present

## 2021-02-25 DIAGNOSIS — I469 Cardiac arrest, cause unspecified: Secondary | ICD-10-CM | POA: Diagnosis not present

## 2021-02-25 DIAGNOSIS — L89102 Pressure ulcer of unspecified part of back, stage 2: Secondary | ICD-10-CM | POA: Diagnosis present

## 2021-02-25 DIAGNOSIS — Y846 Urinary catheterization as the cause of abnormal reaction of the patient, or of later complication, without mention of misadventure at the time of the procedure: Secondary | ICD-10-CM | POA: Diagnosis present

## 2021-02-25 DIAGNOSIS — E669 Obesity, unspecified: Secondary | ICD-10-CM | POA: Diagnosis present

## 2021-02-25 DIAGNOSIS — D709 Neutropenia, unspecified: Secondary | ICD-10-CM | POA: Diagnosis present

## 2021-02-25 DIAGNOSIS — G9340 Encephalopathy, unspecified: Secondary | ICD-10-CM | POA: Diagnosis present

## 2021-02-25 LAB — CBC
HCT: 38.3 % (ref 36.0–46.0)
Hemoglobin: 12.6 g/dL (ref 12.0–15.0)
MCH: 31.4 pg (ref 26.0–34.0)
MCHC: 32.9 g/dL (ref 30.0–36.0)
MCV: 95.5 fL (ref 80.0–100.0)
Platelets: 263 10*3/uL (ref 150–400)
RBC: 4.01 MIL/uL (ref 3.87–5.11)
RDW: 17.5 % — ABNORMAL HIGH (ref 11.5–15.5)
WBC: 12.9 10*3/uL — ABNORMAL HIGH (ref 4.0–10.5)
nRBC: 0 % (ref 0.0–0.2)

## 2021-02-25 LAB — CBG MONITORING, ED: Glucose-Capillary: 57 mg/dL — ABNORMAL LOW (ref 70–99)

## 2021-02-25 LAB — BLOOD CULTURE ID PANEL (REFLEXED) - BCID2

## 2021-02-25 LAB — GLUCOSE, CAPILLARY
Glucose-Capillary: 115 mg/dL — ABNORMAL HIGH (ref 70–99)
Glucose-Capillary: 126 mg/dL — ABNORMAL HIGH (ref 70–99)
Glucose-Capillary: 143 mg/dL — ABNORMAL HIGH (ref 70–99)
Glucose-Capillary: 163 mg/dL — ABNORMAL HIGH (ref 70–99)
Glucose-Capillary: 176 mg/dL — ABNORMAL HIGH (ref 70–99)
Glucose-Capillary: 177 mg/dL — ABNORMAL HIGH (ref 70–99)
Glucose-Capillary: 201 mg/dL — ABNORMAL HIGH (ref 70–99)
Glucose-Capillary: 210 mg/dL — ABNORMAL HIGH (ref 70–99)
Glucose-Capillary: 67 mg/dL — ABNORMAL LOW (ref 70–99)
Glucose-Capillary: 68 mg/dL — ABNORMAL LOW (ref 70–99)
Glucose-Capillary: 77 mg/dL (ref 70–99)

## 2021-02-25 LAB — BASIC METABOLIC PANEL
Anion gap: 8 (ref 5–15)
BUN: 11 mg/dL (ref 8–23)
CO2: 22 mmol/L (ref 22–32)
Calcium: 7.9 mg/dL — ABNORMAL LOW (ref 8.9–10.3)
Chloride: 98 mmol/L (ref 98–111)
Creatinine, Ser: 0.93 mg/dL (ref 0.44–1.00)
GFR, Estimated: >60 mL/min (ref >60)
Glucose, Bld: 91 mg/dL (ref 70–99)
Potassium: 4.1 mmol/L (ref 3.5–5.1)
Sodium: 128 mmol/L — ABNORMAL LOW (ref 135–145)

## 2021-02-25 MED ORDER — ALLOPURINOL 100 MG PO TABS
300.0000 mg | ORAL_TABLET | Freq: Every day | ORAL | Status: DC
Start: 2021-02-25 — End: 2021-02-28
  Administered 2021-02-25 – 2021-02-27 (×3): 300 mg via ORAL
  Filled 2021-02-25 (×3): qty 1

## 2021-02-25 MED ORDER — ONDANSETRON HCL 4 MG PO TABS: 4.0000 mg | ORAL_TABLET | Freq: Four times a day (QID) | ORAL | Status: DC | PRN

## 2021-02-25 MED ORDER — GABAPENTIN 300 MG PO CAPS
300.0000 mg | ORAL_CAPSULE | Freq: Every day | ORAL | Status: DC
Start: 2021-02-25 — End: 2021-02-28
  Administered 2021-02-25 – 2021-02-26 (×2): 300 mg via ORAL
  Filled 2021-02-25 (×2): qty 1

## 2021-02-25 MED ORDER — DAPAGLIFLOZIN PROPANEDIOL 10 MG PO TABS
10.0000 mg | ORAL_TABLET | Freq: Every day | ORAL | Status: DC
  Filled 2021-02-25: qty 1

## 2021-02-25 MED ORDER — SODIUM CHLORIDE 0.9% FLUSH
10.0000 mL | Freq: Two times a day (BID) | INTRAVENOUS | Status: DC
  Administered 2021-02-25 – 2021-02-26 (×3): 10 mL
  Administered 2021-02-27: 30 mL

## 2021-02-25 MED ORDER — SODIUM CHLORIDE 1 G PO TABS
1.0000 g | ORAL_TABLET | Freq: Two times a day (BID) | ORAL | Status: DC
  Administered 2021-02-25 – 2021-02-27 (×6): 1 g via ORAL
  Filled 2021-02-25 (×7): qty 1

## 2021-02-25 MED ORDER — ORAL CARE MOUTH RINSE
15.0000 mL | Freq: Two times a day (BID) | OROMUCOSAL | Status: DC
  Administered 2021-02-25 – 2021-02-27 (×5): 15 mL via OROMUCOSAL

## 2021-02-25 MED ORDER — LEVOTHYROXINE SODIUM 75 MCG PO TABS
150.0000 ug | ORAL_TABLET | Freq: Every day | ORAL | Status: DC
  Administered 2021-02-26 – 2021-02-27 (×2): 150 ug via ORAL
  Filled 2021-02-25 (×2): qty 2

## 2021-02-25 MED ORDER — QUETIAPINE FUMARATE 50 MG PO TABS
25.0000 mg | ORAL_TABLET | Freq: Every day | ORAL | Status: DC
  Administered 2021-02-25 – 2021-02-26 (×2): 25 mg via ORAL
  Filled 2021-02-25 (×2): qty 1

## 2021-02-25 MED ORDER — ONDANSETRON HCL 4 MG/2ML IJ SOLN: 4.0000 mg | Freq: Four times a day (QID) | INTRAMUSCULAR | Status: DC | PRN

## 2021-02-25 MED ORDER — TRAZODONE HCL 50 MG PO TABS
50.0000 mg | ORAL_TABLET | Freq: Every evening | ORAL | Status: DC | PRN
  Administered 2021-02-27: 50 mg via ORAL
  Filled 2021-02-25 (×2): qty 1

## 2021-02-25 MED ORDER — ACETAMINOPHEN 325 MG PO TABS
650.0000 mg | ORAL_TABLET | Freq: Four times a day (QID) | ORAL | Status: DC | PRN
  Administered 2021-02-26 – 2021-02-27 (×2): 650 mg via ORAL
  Filled 2021-02-25 (×3): qty 2

## 2021-02-25 MED ORDER — DEXTROSE-NACL 5-0.45 % IV SOLN: INTRAVENOUS | Status: DC

## 2021-02-25 MED ORDER — OXYCODONE HCL 5 MG PO TABS
5.0000 mg | ORAL_TABLET | Freq: Four times a day (QID) | ORAL | Status: DC | PRN
  Administered 2021-02-26 – 2021-02-27 (×3): 5 mg via ORAL
  Filled 2021-02-25 (×3): qty 1

## 2021-02-25 MED ORDER — DEXTROSE 10 % IV SOLN: INTRAVENOUS | Status: DC

## 2021-02-25 MED ORDER — ALUM & MAG HYDROXIDE-SIMETH 200-200-20 MG/5ML PO SUSP
30.0000 mL | ORAL | Status: DC | PRN
  Administered 2021-02-25: 30 mL via ORAL
  Filled 2021-02-25: qty 30

## 2021-02-25 MED ORDER — ALTEPLASE 2 MG IJ SOLR
2.0000 mg | Freq: Once | INTRAMUSCULAR | Status: AC
  Administered 2021-02-25: 2 mg
  Filled 2021-02-25: qty 2

## 2021-02-25 MED ORDER — ENSURE ENLIVE PO LIQD
237.0000 mL | Freq: Two times a day (BID) | ORAL | Status: DC
  Administered 2021-02-26: 237 mL via ORAL

## 2021-02-25 MED ORDER — CHLORHEXIDINE GLUCONATE CLOTH 2 % EX PADS
6.0000 | MEDICATED_PAD | Freq: Every day | CUTANEOUS | Status: DC
  Administered 2021-02-25 – 2021-02-27 (×3): 6 via TOPICAL

## 2021-02-25 MED ORDER — OXYBUTYNIN CHLORIDE ER 5 MG PO TB24
10.0000 mg | ORAL_TABLET | Freq: Every day | ORAL | Status: DC
  Administered 2021-02-25 – 2021-02-27 (×3): 10 mg via ORAL
  Filled 2021-02-25 (×3): qty 1

## 2021-02-25 MED ORDER — DEXTROSE 50 % IV SOLN
12.5000 g | Freq: Once | INTRAVENOUS | Status: AC
  Administered 2021-02-25: 12.5 g via INTRAVENOUS
  Filled 2021-02-25: qty 50

## 2021-02-25 MED ORDER — ACETAMINOPHEN 650 MG RE SUPP: 650.0000 mg | Freq: Four times a day (QID) | RECTAL | Status: DC | PRN

## 2021-02-25 MED ORDER — CITALOPRAM HYDROBROMIDE 20 MG PO TABS
20.0000 mg | ORAL_TABLET | Freq: Every day | ORAL | Status: DC
Start: 2021-02-25 — End: 2021-02-28
  Administered 2021-02-25 – 2021-02-27 (×3): 20 mg via ORAL
  Filled 2021-02-25 (×3): qty 1

## 2021-02-25 MED ORDER — AMITRIPTYLINE HCL 25 MG PO TABS
100.0000 mg | ORAL_TABLET | Freq: Every day | ORAL | Status: DC
  Administered 2021-02-25 – 2021-02-26 (×2): 100 mg via ORAL
  Filled 2021-02-25 (×2): qty 4

## 2021-02-25 MED ORDER — PANTOPRAZOLE SODIUM 40 MG PO TBEC
40.0000 mg | DELAYED_RELEASE_TABLET | Freq: Every day | ORAL | Status: DC
  Administered 2021-02-25 – 2021-02-27 (×3): 40 mg via ORAL
  Filled 2021-02-25 (×3): qty 1

## 2021-02-25 MED ORDER — ENOXAPARIN SODIUM 40 MG/0.4ML IJ SOSY
40.0000 mg | PREFILLED_SYRINGE | INTRAMUSCULAR | Status: DC
  Administered 2021-02-25 – 2021-02-27 (×3): 40 mg via SUBCUTANEOUS
  Filled 2021-02-25 (×3): qty 0.4

## 2021-02-25 MED ORDER — SODIUM CHLORIDE 0.9 % IV SOLN
2.0000 g | Freq: Two times a day (BID) | INTRAVENOUS | Status: DC
  Administered 2021-02-25 – 2021-02-27 (×6): 2 g via INTRAVENOUS
  Filled 2021-02-25 (×8): qty 2

## 2021-02-25 MED ORDER — PRAMIPEXOLE DIHYDROCHLORIDE 1 MG PO TABS
1.5000 mg | ORAL_TABLET | Freq: Three times a day (TID) | ORAL | Status: DC
  Administered 2021-02-25 – 2021-02-27 (×8): 1.5 mg via ORAL
  Filled 2021-02-25 (×12): qty 2

## 2021-02-25 NOTE — Assessment & Plan Note (Addendum)
Holding lasix since we suspect poor PO intake is responsible for hypoglycemia and hyponatremia.  Currently euvolemic.

## 2021-02-25 NOTE — Assessment & Plan Note (Addendum)
Hold all home hypoglycemics in setting of hypoglycemia. Actually: Pt has history of hypoglycemia in past resulting in hospitalization and her being taken off of insulin.  Taking glipizide and Farxiga at home.  Discontinue all hypoglycemics.  Maintaining without dextrose infusion today. Will start on sensitive sliding scale.

## 2021-02-25 NOTE — Evaluation (Signed)
Occupational Therapy Evaluation Patient Details Name: Jillian Leblanc MRN: [id number] DOB: [birth year] Today's Date: 02/25/2021   History of Present Illness Jillian Leblanc is a 76 y.o. female with medical history significant of DM2, HFpEF, PPM and L4-S1 osteomyelitis and ecoli bacteremia, chronic subacute dislocation of left shoulder, right rTSA is now admitted with hyponatremia and confusion.   Clinical Impression   Mrs. Jillian Leblanc is a 76 year old woman who presents with above medical history. On evaluation she is somewhat confused but can follow simple commands. Unsure of patient's PLOF - from a recent hospitalization she went home with a hoyer lift and hospital bed bed from a more recent ER visit she was able to transfer to a wheelchair and into a car. She is fairly adamant that she does a lot for herself. She exhibits poor chronic shoulder ROM, generalized weakness, decreased activity tolerance and appears to be lacking ankle dorsiflexion. Patient max assist to roll left and right and max assist to sit edge of bed. She needs assistance with all ADLs either in sitting or at bed level. Patient has excoriated skin in periarea and all folds. Patient appears to bed bound and requiring 24/7 assistance with minimal rehab potential. If patient unable to return home with 24/7 assistance would recommend LTC. No OT needs at this time.     Recommendations for follow up therapy are one component of a multi-disciplinary discharge planning process, led by the attending physician.  Recommendations may be updated based on patient status, additional functional criteria and insurance authorization.   Follow Up Recommendations  Long-term institutional care without follow-up therapy    Assistance Recommended at Discharge Frequent or constant Supervision/Assistance  Patient can return home with the following A lot of help with walking and/or transfers;A lot of help with bathing/dressing/bathroom;Assistance with  cooking/housework;Help with stairs or ramp for entrance    Functional Status Assessment  Patient has had a recent decline in their functional status and/or demonstrates limited ability to make significant improvements in function in a reasonable and predictable amount of time  Equipment Recommendations  None recommended by OT    Recommendations for Other Services       Precautions / Restrictions Precautions Precaution Comments: poor shoulder ROM, incontinent of BM Restrictions Weight Bearing Restrictions: No       ADL either performed or assessed with clinical judgement   ADL Overall ADL's : Needs assistance/impaired Eating/Feeding: Set up   Grooming: Set up;Oral care;Wash/dry face;Wash/dry hands Grooming Details (indicate cue type and reason): requires assistance for hair care due to poor shoulder ROM Upper Body Bathing: Set up;Minimal assistance;Bed level   Lower Body Bathing: Total assistance;Bed level   Upper Body Dressing : Bed level;Maximal assistance   Lower Body Dressing: Total assistance;Bed level   Toilet Transfer: Total assistance   Toileting- Clothing Manipulation and Hygiene: Total assistance Toileting - Clothing Manipulation Details (indicate cue type and reason): has foley catheter and incontinent of BM -total care for cleaning.       General ADL Comments: Patient requires max assist to roll left and right for ADL tasks at bed level. Patient max assist to trasnfer to sitting on side of bed. Initially poor balance that improved to min guard.     Vision   Vision Assessment?: No apparent visual deficits     Perception     Praxis      Pertinent Vitals/Pain Pain Assessment Pain Assessment: Faces Faces Pain Scale: Hurts whole lot Pain Location: periarea Pain Descriptors / Indicators: Grimacing, Guarding,  Restless Pain Intervention(s): Limited activity within patient's tolerance     Hand Dominance Right   Extremity/Trunk Assessment Upper  Extremity Assessment Upper Extremity Assessment: RUE deficits/detail;LUE deficits/detail RUE Deficits / Details: 3-/5 shoulder ROm, otherwise grossly 3+/5 ROm and strength RUE Sensation: WNL RUE Coordination: WNL LUE Deficits / Details: 3-/5 shoulder ROM, otherwise grossly 3+/5 ROm and strength LUE Sensation: WNL LUE Coordination: WNL   Lower Extremity Assessment Lower Extremity Assessment: Defer to PT evaluation   Cervical / Trunk Assessment Cervical / Trunk Assessment: Normal   Communication Communication Communication: No difficulties   Cognition Arousal/Alertness: Awake/alert Behavior During Therapy: Restless Overall Cognitive Status: No family/caregiver present to determine baseline cognitive functioning                                 General Comments: COnfused Word finding difficulties. Alert to self and hospital and month. Able to follow commands.     General Comments       Exercises     Shoulder Instructions      Home Living Family/patient expects to be discharged to:: Unsure Living Arrangements: Spouse/significant other                               Additional Comments: Patient lives with spouse and has assistance of daughter but unsure how much.      Prior Functioning/Environment Prior Level of Function : Needs assist             Mobility Comments: Unsure. Appears mobility limited. She was transferred to a wc on 1/6 in the ED to get in car - so was transferring a month ago.. She returned home from a prior hospitilzation with a hospital bed and hoyer lift. ADLs Comments: needs assistance        OT Problem List: Decreased strength;Decreased range of motion;Decreased activity tolerance;Impaired balance (sitting and/or standing);Decreased knowledge of use of DME or AE;Decreased safety awareness;Decreased cognition;Pain;Impaired UE functional use;Obesity      OT Treatment/Interventions: Self-care/ADL training;Therapeutic  exercise;DME and/or AE instruction;Therapeutic activities;Balance training;Patient/family education    OT Goals(Current goals can be found in the care plan section) Acute Rehab OT Goals OT Goal Formulation: Patient unable to participate in goal setting Time For Goal Achievement: 03/11/21 Potential to Achieve Goals: Fair  OT Frequency:      Co-evaluation              AM-PAC OT "6 Clicks" Daily Activity     Outcome Measure Help from another person eating meals?: A Little Help from another person taking care of personal grooming?: A Little Help from another person toileting, which includes using toliet, bedpan, or urinal?: Total Help from another person bathing (including washing, rinsing, drying)?: A Lot Help from another person to put on and taking off regular upper body clothing?: A Lot Help from another person to put on and taking off regular lower body clothing?: Total 6 Click Score: 12   End of Session Nurse Communication: Mobility status  Activity Tolerance: Patient tolerated treatment well Patient left: in bed;with call bell/phone within reach;with bed alarm set  OT Visit Diagnosis: Muscle weakness (generalized) (M62.81);Pain                Time: 8563-1497 OT Time Calculation (min): 32 min Charges:  OT General Charges $OT Visit: 1 Visit OT Evaluation $OT Eval Low Complexity: 1 Low OT Treatments $Self Care/Home  Management : 8-22 mins  Waldron SessionVonda, OTR/L Acute Care Rehab Services  Office [phone] Pager: [phone]   Kelli ChurnVonda L Corynne Scibilia 02/25/2021, 5:01 PM

## 2021-02-25 NOTE — Progress Notes (Signed)
Pharmacy Antibiotic Note  Jillian Leblanc is a 76 y.o. female admitted on 2021-03-03 with dehydration, low grade temp, altered mental status, possible UTI.  Pharmacy has been consulted for Cefepime dosing.  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  Other recent history includes E. Coli bacteremia, epidural abscess, indwelling foley catheter with recent admission for pseudomonas UTI which was treated with cefepime.  Plan: Cefepime 2gm IV q12h Follow renal function F/u culture results and sensitivities  Height: 5' (152.4 cm) Weight: 78.1 kg (172 lb 2.9 oz) IBW/kg (Calculated) : 45.5  Temp (24hrs), Avg:99 F (37.2 C), Min:98.4 F (36.9 C), Max:99.6 F (37.6 C)  Recent Labs  Lab 03-03-2021 1928  WBC 13.4*  CREATININE 0.95  LATICACIDVEN 1.7    Estimated Creatinine Clearance: 48.7 mL/min (by C-G formula based on SCr of 0.95 mg/dL).    Allergies  Allergen Reactions   Pentosan Polysulfate Sodium Other (See Comments)    "[facility] hair fell out 10/21/2009 12:00:00 AM by Johny Blamer CNA 1"   Elmiron [Pentosan Polysulfate] Other (See Comments)    "Hair fell out"   Naloxone Other (See Comments)    Hallucinations/Confusion/Nightmares    Antimicrobials this admission: 2/2 Cefepime >>      Dose adjustments this admission:    Microbiology results: 2/1 BCx:   2/1 UCx:       Thank you for allowing pharmacy to be a part of this patients care.  Maryellen Pile, PharmD 02/25/2021 3:44 AM

## 2021-02-25 NOTE — Assessment & Plan Note (Addendum)
Due to urinary retention.  Foley catheter exchanged 2/1.  Could not remove Foley catheter until mobility improves.

## 2021-02-25 NOTE — Plan of Care (Signed)
  Problem: Education: Goal: Knowledge of General Education information will improve Description Including pain rating scale, medication(s)/side effects and non-pharmacologic comfort measures Outcome: Progressing   

## 2021-02-25 NOTE — Progress Notes (Signed)
Admitted to 1402 from [place]. CBG-77. Alert and oriented to person/place/situation but not time. Intermittent confusion noted and delayed responses as well. States she lives at home w/ husband and that daughter, Abigail Butts comes over almost every day to assist w/ medication or grocery store errands. She states she does not know what medications she takes at home. VS checked. Has a PM left chest. Denies pain at this time, but is sore on areas of skin that have MASD/see assessment. Bathed and CHG. Small BM. D5 1/2 NS @ 150 cc/hr infusing in 22 G LFA . PICC RUA intact w/ clear dressing. Foley intact w/ amber, clear urine. EMS emptied 200 cc of urine before she was admitted here. Attempt to ask Hx questions but she is unsure of a lot of questions asked. She states that she had a fall this week, but then when asked about the fall again, states, " I haven't had any falls." Poor historian. Call light in reach. Continuing to monitor. Notified admitting of arrival.

## 2021-02-25 NOTE — Progress Notes (Signed)
PHARMACY - PHYSICIAN COMMUNICATION CRITICAL VALUE ALERT - BLOOD CULTURE IDENTIFICATION (BCID)  Jillian Leblanc is an 76 y.o. female who presented to [facility] on 03/22/2021 with a chief complaint of dehydration, AMS.   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 Other recent history includes E. coli bacteremia, epidural abscess, indwelling foley catheter with recent admission for pseudomonas UTI which was treated with Cefepime. Cefazolin broadened to Cefepime on this admission due to concern for superimposed UTI as well.    Name of physician (or Provider) Contacted: Johann Capers, NP  Current antibiotics: Cefepime  Changes to prescribed antibiotics recommended:  [name] on recommended antibiotics - No changes needed  Results for orders placed or performed during the hospital encounter of 03/13/2021  Blood Culture ID Panel (Reflexed) (Collected: 03/21/2021  9:35 PM)  Result Value Ref Range   Enterococcus faecalis NOT DETECTED NOT DETECTED   Enterococcus Faecium NOT DETECTED NOT DETECTED   Listeria monocytogenes NOT DETECTED NOT DETECTED   Staphylococcus species NOT DETECTED NOT DETECTED   Staphylococcus aureus (BCID) NOT DETECTED NOT DETECTED   Staphylococcus epidermidis NOT DETECTED NOT DETECTED   Staphylococcus lugdunensis NOT DETECTED NOT DETECTED   Streptococcus species NOT DETECTED NOT DETECTED   Streptococcus agalactiae NOT DETECTED NOT DETECTED   Streptococcus pneumoniae NOT DETECTED NOT DETECTED   Streptococcus pyogenes NOT DETECTED NOT DETECTED   A.calcoaceticus-baumannii NOT DETECTED NOT DETECTED   Bacteroides fragilis NOT DETECTED NOT DETECTED   Enterobacterales NOT DETECTED NOT DETECTED   Enterobacter cloacae complex NOT DETECTED NOT DETECTED   Escherichia coli NOT DETECTED NOT DETECTED   Klebsiella aerogenes NOT DETECTED NOT DETECTED   Klebsiella oxytoca NOT DETECTED NOT DETECTED   Klebsiella pneumoniae NOT DETECTED  NOT DETECTED   Proteus species NOT DETECTED NOT DETECTED   Salmonella species NOT DETECTED NOT DETECTED   Serratia marcescens NOT DETECTED NOT DETECTED   Haemophilus influenzae NOT DETECTED NOT DETECTED   Neisseria meningitidis NOT DETECTED NOT DETECTED   Pseudomonas aeruginosa DETECTED (A) NOT DETECTED   Stenotrophomonas maltophilia NOT DETECTED NOT DETECTED   Candida albicans NOT DETECTED NOT DETECTED   Candida auris NOT DETECTED NOT DETECTED   Candida glabrata NOT DETECTED NOT DETECTED   Candida krusei NOT DETECTED NOT DETECTED   Candida parapsilosis NOT DETECTED NOT DETECTED   Candida tropicalis NOT DETECTED NOT DETECTED   Cryptococcus neoformans/gattii NOT DETECTED NOT DETECTED   CTX-M ESBL NOT DETECTED NOT DETECTED   Carbapenem resistance IMP NOT DETECTED NOT DETECTED   Carbapenem resistance KPC NOT DETECTED NOT DETECTED   Carbapenem resistance NDM NOT DETECTED NOT DETECTED   Carbapenem resistance VIM NOT DETECTED NOT DETECTED    Jamse Mead 02/25/2021  6:31 PM

## 2021-02-25 NOTE — Assessment & Plan Note (Addendum)
Most likely due to some combination of hypoglycemia, hyponatremia, and/or UTI (all of which are present). Patient may have some baseline cognitive dysfunction.  Remains confused at times.  Using Haldol as needed.  All-time fall precautions and delirium precautions.

## 2021-02-25 NOTE — Assessment & Plan Note (Addendum)
Continue Oxy PRN for back pain.  We will try to avoid opiates as much possible.

## 2021-02-25 NOTE — H&P (Addendum)
History and Physical    Patient: Jillian Leblanc [id number]:[id number] DOB: December 22, 1945 DOA: 03/07/2021 DOS: the patient was seen and examined on 02/25/2021 PCP: Guadlupe Spanish, MD  Patient coming from: Home  Chief Complaint:  Chief Complaint  Patient presents with   Dehydration    HPI: Jillian Leblanc is a 76 y.o. female with medical history significant of DM2, HFpEF, PPM.  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  Had decompression and drainage at that time.  E.coli bacteremia also at that time.  Pt put on Ancef with plans to treat through Feb.   Pt re-admitted 12/20-12/30 for AMS due to CAUTI (chronic foley use due to urinary retention).  Urine grew out pseudomonas at that time, pt treated with 7 days of cefepime.  Pt re-admitted 1/4-1/6 with AMS due to hyponatremia from dehydration.  Hyponatremia improved with just normal saline and continuing her home PO salt tabs she had been taking for hyponatremia since prior hospital stay.  Regarding hyponatremia: put on salt tabs during prior hospital stay.  Urine sodium in Dec was only 13.  Pt sent in to ED at [facility] today by daughter due to increased confusion and hallucination for the past 4 days per daughter.  She has not gotten out of bed and daughter reports she seems to be acting the way she did the last time she had to be hospitalized.  Daughter also confirmed poor PO intake.  Work up in ED today confirmed mild hyponatremia with sodium 128 today.  Also hypoglycemia that persisted despite D50 and D5 half gtt!  Pt has history of hypoglycemia in past resulting in hospitalization and her being taken off of insulin.  She is not supposed to be on insulin at home, it is unclear if she is being given any insulin at home at this time.  Review of Systems: As mentioned in the history of present illness. All other systems reviewed and are negative. Past Medical History:  Diagnosis Date   CHF (congestive  heart failure) (HCC)    Chronic back pain    Diabetes mellitus without complication ([place])    Dysrhythmia    Hypothyroidism    Interstitial cystitis    Presence of permanent cardiac pacemaker    Thyroid disease    Past Surgical History:  Procedure Laterality Date   ABDOMINAL HYSTERECTOMY     CHOLECYSTECTOMY     NECK SURGERY     PACEMAKER IMPLANT N/A 08/16/2019   Procedure: PACEMAKER IMPLANT;  Surgeon: Constance Haw, MD;  Location: [facility];  Service: Cardiovascular;  Laterality: N/A;   REVERSE SHOULDER ARTHROPLASTY Right 07/28/2020   Procedure: REVERSE SHOULDER ARTHROPLASTY;  Surgeon: Justice Britain, MD;  Location: [facility];  Service: Orthopedics;  Laterality: Right;   SHOULDER SURGERY     THYROIDECTOMY     Social History:  reports that she has never smoked. She has never used smokeless tobacco. She reports that she does not drink alcohol and does not use drugs.  Allergies  Allergen Reactions   Elmiron [Pentosan Polysulfate] Other (See Comments)    "Hair fell out"   Naloxone Other (See Comments)    Hallucinations/Confusion/Nightmares    Family History  Problem Relation Age of Onset   Immunodeficiency Neg Hx     Prior to Admission medications   Medication Sig Start Date End Date Taking? Authorizing Provider  acetaminophen (TYLENOL) 500 MG tablet Take 2 tablets (1,000 mg total) by mouth every 8 (eight) hours as  needed. Patient taking differently: Take 1,000 mg by mouth every 8 (eight) hours as needed (for pain- not to exceed 3-4 grams total from all combined sources/24 hours). 01/08/21  Yes Ghimire, Henreitta Leber, MD  calcium-vitamin D 250-100 MG-UNIT tablet Take 1 tablet by mouth 2 (two) times daily. 09/14/20  Yes Palumbo, April, MD  ceFAZolin (ANCEF) IVPB Inject 2 g into the vein every 8 (eight) hours. Indication: E.coli epidural abscess/osteo First Dose: Yes Last Day of Therapy:  03/01/21 Labs - Once weekly:  CBC/D and BMP, Labs - Every other week:  ESR and  CRP Method of administration: IV Push Method of administration may be changed at the discretion of home infusion pharmacist based upon assessment of the patient and/or caregiver's ability to self-administer the medication ordered. 01/08/21 03/03/21  Ghimire, Henreitta Leber, MD  folic acid (FOLVITE) 1 MG tablet Take 1 tablet (1 mg total) by mouth daily. 01/23/21   Regalado, Belkys A, MD  furosemide (LASIX) 40 MG tablet Take 1 tablet (40 mg total) by mouth daily. 01/08/21   Ghimire, Henreitta Leber, MD  gabapentin (NEURONTIN) 300 MG capsule Take 1 capsule (300 mg total) by mouth 2 (two) times daily. Patient taking differently: Take 300 mg by mouth in the morning and at bedtime. 12/08/20   Nita Sells, MD  ipratropium-albuterol (DUONEB) 0.5-2.5 (3) MG/3ML SOLN Take 3 mLs by nebulization every 8 (eight) hours. 01/08/21   Ghimire, Henreitta Leber, MD  levothyroxine (SYNTHROID) 150 MCG tablet Take 150 mcg by mouth daily before breakfast. 09/01/20   [provider]  lidocaine (LIDODERM) 5 % Place 1 patch onto the skin daily. Remove & Discard patch within 12 hours or as directed by MD Patient taking differently: Place 1 patch onto the skin See admin instructions. Apply 1 patch topically daily and remove at bedtime- (Remove & Discard patch within 12 hours or as directed by MD) 01/09/21   Ghimire, Henreitta Leber, MD  magnesium oxide (MAG-OX) 400 (240 Mg) MG tablet Take 1 tablet (400 mg total) by mouth 2 (two) times daily. 01/22/21   Regalado, Belkys A, MD  oxyCODONE (OXY IR/ROXICODONE) 5 MG immediate release tablet Take 1-2 tablets (5-10 mg total) by mouth every 6 (six) hours as needed for moderate pain or severe pain. 01/08/21   Ghimire, Henreitta Leber, MD  OXYGEN Inhale 2 L/min into the lungs continuous.    [provider]  pantoprazole (PROTONIX) 40 MG tablet Take 1 tablet (40 mg total) by mouth daily. Patient taking differently: Take 40 mg by mouth in the morning. 01/09/21   Ghimire, Henreitta Leber, MD   polyethylene glycol (MIRALAX / GLYCOLAX) 17 g packet Take 17 g by mouth daily as needed. Patient taking differently: Take 17 g by mouth daily as needed for mild constipation. 01/08/21   Ghimire, Henreitta Leber, MD  potassium chloride (KLOR-CON) 20 MEQ packet Take 40 mEq by mouth daily. 01/23/21   Regalado, Belkys A, MD  pramipexole (MIRAPEX) 1.5 MG tablet Take 1.5 mg by mouth 3 (three) times daily. 06/29/20   [provider]  QUEtiapine (SEROQUEL) 25 MG tablet Take 1 tablet (25 mg total) by mouth at bedtime. 01/08/21   Ghimire, Henreitta Leber, MD  senna-docusate (SENOKOT-S) 8.6-50 MG tablet Take 1 tablet by mouth 2 (two) times daily. 01/22/21   Regalado, Belkys A, MD  sodium chloride 1 g tablet Take 1 tablet (1 g total) by mouth 2 (two) times daily with a meal. 01/08/21   Ghimire, Henreitta Leber, MD  traZODone (DESYREL) 50 MG  tablet Take 1 tablet (50 mg total) by mouth at bedtime as needed for sleep. 01/22/21   Regalado, Belkys A, MD  vitamin B-12 1000 MCG tablet Take 1 tablet (1,000 mcg total) by mouth daily. 01/23/21   Elmarie Shiley, MD    Physical Exam: Vitals:   02/25/21 0000 02/25/21 0100 02/25/21 0200 02/25/21 0250  BP: 126/61 (!) 134/104  139/74  Pulse:  100  97  Resp: _0 Temp:    99.6 F (37.6 C)  TempSrc:    Oral  SpO2: 92% 100%  100%  Weight:   78.1 kg   Height:   5' (1.524 m)    Constitutional: NAD, calm, comfortable Eyes: PERRL, lids and conjunctivae normal ENMT: Mucous membranes are moist. Posterior pharynx clear of any exudate or lesions.Normal dentition.  Neck: normal, supple, no masses, no thyromegaly Respiratory: clear to auscultation bilaterally, no wheezing, no crackles. Normal respiratory effort. No accessory muscle use.  Cardiovascular: Regular rate and rhythm, no murmurs / rubs / gallops. No extremity edema. 2+ pedal pulses. No carotid bruits.  Abdomen: no tenderness, no masses palpated. No hepatosplenomegaly. Bowel sounds positive.  Musculoskeletal: no  clubbing / cyanosis. No joint deformity upper and lower extremities. Good ROM, no contractures. Normal muscle tone.  Skin: no rashes, lesions, ulcers. No induration Neurologic: CN 2-12 grossly intact. Sensation intact, DTR normal. Strength 5/5 in all 4.  Psychiatric: Confused, oriented to person and place, but not able to help with history, answers "Im sorry, I dont know".   Data Reviewed:  Pt with hyponatremia sodium 128 and hypoglycemia.  Assessment and Plan: * Hypoglycemia- (present on admission) Q2H CBGs Hold home hypoglycemics D5half at 125, try to wean ASAP and switch to isotonic saline due to hyponatremia.  (Isotonic saline worked for hyponatremia in Jan).   Hyponatremia- (present on admission) Last month when she had this, ended up being due to hypovolemia and it resolved with isotonic saline. Urine sodium in Dec was only 13. Trying to avoid D10 gtt making her even more hyponatremic. Repeat BMP this AM Keeping on D5half but will try to wean this ASAP if RN able to get her to take POs for the hypoglycemia.  In which case plan to start isotonic normal saline (since this worked well last month in addition to PO salt tabs). Will continue PO salt tabs that she has been on for last couple of months.  Chronic indwelling Foley catheter Due to urinary retention.  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 Holding ancef and putting on cefepime instead for the moment due to suspected superimposed UTI today.  Chronic diastolic CHF (congestive heart failure) ([place])- (present on admission) Holding lasix since we suspect poor PO intake is responsible for hypoglycemia and hyponatremia.  Acute metabolic encephalopathy- (present on admission) Most likely due to some combination of hypoglycemia, hyponatremia, and/or UTI (all of which are present).  Chronic pain- (present on admission) Continue Oxy PRN for back pain  Diabetes mellitus type 2 in obese  [facility])- (present on admission) Hold all home hypoglycemics in setting of hypoglycemia. Actually: Pt has history of hypoglycemia in past resulting in hospitalization and her being taken off of insulin.  She is not supposed to be on insulin or any hypoglycemics at home at this point, it is unclear if she is being given any insulin at home at this time resulting in her hypoglycemia today.       Advance Care Planning:  Code Status: Full Code   Consults: None  Family Communication: No family in room  Severity of Illness: The appropriate patient status for this patient is OBSERVATION. Observation status is judged to be reasonable and necessary in order to provide the required intensity of service to ensure the patient's safety. The patient's presenting symptoms, physical exam findings, and initial radiographic and laboratory data in the context of their medical condition is felt to place them at decreased risk for further clinical deterioration. Furthermore, it is anticipated that the patient will be medically stable for discharge from the hospital within 2 midnights of admission.   Author: Etta Quill., DO 02/25/2021 5:48 AM  For on call review [email or web address].

## 2021-02-25 NOTE — Plan of Care (Signed)

## 2021-02-25 NOTE — Assessment & Plan Note (Addendum)
On ABx through 03/03/21 for this.  Currently on cefepime. Challenging to get rid of infection.  Followed by ID.  PICC line removed.

## 2021-02-25 NOTE — Progress Notes (Signed)
Progress Note   Patient: Jillian Leblanc [id number]:[id number] DOB: 1945-04-11 DOA: 2021-02-25     0 DOS: the patient was seen and examined on 02/25/2021   Brief hospital course: Jillian Leblanc is a 76 y.o. female with medical history significant of DM2, HFpEF, PPM.   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  Had decompression and drainage at that time.  E.coli bacteremia also at that time.  Pt put on Ancef with plans to treat through Feb.   Pt re-admitted 12/20-12/30 for AMS due to CAUTI (chronic foley use due to urinary retention).  Urine grew out pseudomonas at that time, pt treated with 7 days of cefepime and discharged home on Ancef.   Pt re-admitted 1/4-1/6 with AMS due to hyponatremia from dehydration.  Hyponatremia improved with just normal saline and continuing her home PO salt tabs she had been taking for hyponatremia since prior hospital stay.  Apparently she also had episodes of hypoglycemia last time, she continued to take glipizide at home.   Pt sent in to ED at [facility] today by daughter due to increased confusion and hallucination for the past 4 days. She has not gotten out of bed and daughter reports she seems to be acting the way she did the last time she had to be hospitalized.  Daughter also confirmed poor PO intake.   Work up in ED today confirmed mild hyponatremia with sodium 128 today.  Also hypoglycemia that persisted despite D50 and D5 half gtt!   Pt has history of hypoglycemia in past resulting in hospitalization and her being taken off of insulin.  [name] on glipizide 10 mg and Jardiance at home.  She is not on insulin.  Admitted with altered mental status, hypoglycemia and failure to thrive.  Assessment and Plan: * Acute metabolic encephalopathy- (present on admission) Most likely due to some combination of hypoglycemia, hyponatremia, and/or UTI (all of which are present).  Hypoglycemia- (present on admission) Q2H CBGs.   Medication management as above.    Chronic indwelling Foley catheter Due to urinary retention.  Foley catheter exchanged 2/1.  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 Holding ancef and putting on cefepime instead for the moment due to suspected superimposed UTI today. PICC line was rechecked and functioning well. We will discuss with infectious disease team once cultures available.  She may be completing her antibiotics this week.  Chronic diastolic CHF (congestive heart failure) (HCC)- (present on admission) Holding lasix since we suspect poor PO intake is responsible for hypoglycemia and hyponatremia.  Currently euvolemic.  Chronic pain- (present on admission) Continue Oxy PRN for back pain  Diabetes mellitus type 2 in obese [facility])- (present on admission) Hold all home hypoglycemics in setting of hypoglycemia. Actually: Pt has history of hypoglycemia in past resulting in hospitalization and her being taken off of insulin.  Currently on maintenance dextrose infusion. Verified with family that she is continue to take glipizide and Jardiance.  We will discontinue glipizide altogether.  Continue Jardiance.  Continue dextrose infusion today.  Hyponatremia- (present on admission) Last month when she had this, ended up being due to hypovolemia and it resolved with isotonic saline. Continue salt tablets.        Subjective: Patient seen and examined.  She is alert awake.  Looks comfortable.  She tells me she has mild back pain. She tells me that she has not been walking since she had first surgery 2 months ago.  Denies any nausea or vomiting.  Appetite is poor.  Poor historian.  Patient tells me that her daughter brought her to the emergency room because daughter thought she was hallucinating.  Patient herself does not quite feel different than before.  Remains afebrile overnight.  Blood sugars improving on current dextrose infusion.  Taken  glipizide in the morning of 2/1, will continue dextrose today with risk of hypoglycemia due to long-acting sulfonylurea.  Physical Exam: Vitals:   02/25/21 0200 02/25/21 0250 02/25/21 0624 02/25/21 1056  BP:  139/74 127/74 137/72  Pulse:  97 96 60  Resp:  18 (!) 21 16  Temp:  99.6 F (37.6 C) 98.6 F (37 C) 99.4 F (37.4 C)  TempSrc:  Oral Oral Oral  SpO2:  100% 96% (!) 87%  Weight: 78.1 kg     Height: 5' (1.524 m)      General: Looks comfortable.  Frail and debilitated.  Chronically sick looking.  Not in any distress.  On room air. Cardiovascular: S1-S2 normal.  Regular rate rhythm. Respiratory: Bilateral clear.  No added sounds. Gastrointestinal: Soft.  Nontender.  Bowel sound present. Ext: No swelling or edema.  No cyanosis. Neuro: [name] alert and awake.  She has cognitive decline.  She is oriented x1-2.  Behavior is well controlled.  Denies any hallucinations or delusions. Skin: Sacral decubitus ulcer as reported above. Psych: Flat affect IV lines: PICC line right arm. Catheters: Foley catheter, chronic indwelling.  Exchanged 2/1.   Data Reviewed:   Family Communication: Daughter on the phone  Disposition: Status is: Observation The patient will require care spanning > 2 midnights and should be moved to inpatient because: Significant altered mental status.  IV antibiotics.  IV dextrose to support blood sugars.        Planned Discharge Destination: Home with Home Health     Time spent: 35 minutes  Author: Dorcas Carrow, MD 02/25/2021 11:28 AM  For on call review [email or web address].

## 2021-02-25 NOTE — Progress Notes (Signed)
°  Transition of Care [facility]) Screening Note   Patient Details  Name: Jillian Leblanc Date of Birth: 10/22/1945   Transition of Care [facility]) CM/SW Contact:    Lanier Clam, RN Phone Number: 02/25/2021, 10:09 AM    Transition of Care Department [facility]) has reviewed patient and no TOC needs have been identified at this time. We will continue to monitor patient advancement through interdisciplinary progression rounds. If new patient transition needs arise, please place a TOC consult.

## 2021-02-25 NOTE — Hospital Course (Signed)
Jillian Leblanc is a 76 y.o. female with medical history significant of DM2, HFpEF, PPM.   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  Had decompression and drainage at that time.  E.coli bacteremia also at that time.  Pt put on Ancef with plans to treat through Feb.   Pt re-admitted 12/20-12/30 for AMS due to CAUTI (chronic foley use due to urinary retention).  Urine grew out pseudomonas at that time, pt treated with 7 days of cefepime and discharged home on Ancef.   Pt re-admitted 1/4-1/6 with AMS due to hyponatremia from dehydration.  Hyponatremia improved with just normal saline and continuing her home PO salt tabs she had been taking for hyponatremia since prior hospital stay.  Apparently she also had episodes of hypoglycemia last time, she continued to take glipizide at home.   Pt sent in to ED at [facility] today by daughter due to increased confusion and hallucination for the past 4 days. She has not gotten out of bed and daughter reports she seems to be acting the way she did the last time she had to be hospitalized.  Daughter also confirmed poor PO intake.   Work up in ED today confirmed mild hyponatremia with sodium 128 today.  Also hypoglycemia that persisted despite D50 and D5 half gtt!   Pt has history of hypoglycemia in past resulting in hospitalization and her being taken off of insulin.  [name] on glipizide 10 mg and Jardiance at home.  She is not on insulin.  Admitted with altered mental status, hypoglycemia and failure to thrive.

## 2021-02-25 NOTE — Assessment & Plan Note (Addendum)
Last month when she had this, ended up being due to hypovolemia and it resolved with isotonic saline. Continue salt tablets.  Stable.

## 2021-02-26 ENCOUNTER — Inpatient Hospital Stay (HOSPITAL_COMMUNITY): Payer: Medicare Other

## 2021-02-26 ENCOUNTER — Telehealth: Payer: Medicare Other | Admitting: Internal Medicine

## 2021-02-26 ENCOUNTER — Other Ambulatory Visit: Payer: Self-pay

## 2021-02-26 DIAGNOSIS — B965 Pseudomonas (aeruginosa) (mallei) (pseudomallei) as the cause of diseases classified elsewhere: Secondary | ICD-10-CM

## 2021-02-26 DIAGNOSIS — E669 Obesity, unspecified: Secondary | ICD-10-CM

## 2021-02-26 DIAGNOSIS — M4646 Discitis, unspecified, lumbar region: Secondary | ICD-10-CM | POA: Diagnosis not present

## 2021-02-26 DIAGNOSIS — B9620 Unspecified Escherichia coli [E. coli] as the cause of diseases classified elsewhere: Secondary | ICD-10-CM

## 2021-02-26 DIAGNOSIS — R7881 Bacteremia: Secondary | ICD-10-CM

## 2021-02-26 DIAGNOSIS — A4152 Sepsis due to Pseudomonas: Secondary | ICD-10-CM

## 2021-02-26 DIAGNOSIS — E1169 Type 2 diabetes mellitus with other specified complication: Secondary | ICD-10-CM

## 2021-02-26 DIAGNOSIS — Z978 Presence of other specified devices: Secondary | ICD-10-CM

## 2021-02-26 DIAGNOSIS — G9341 Metabolic encephalopathy: Secondary | ICD-10-CM | POA: Diagnosis not present

## 2021-02-26 LAB — BLOOD CULTURE ID PANEL (REFLEXED) - BCID2

## 2021-02-26 LAB — BASIC METABOLIC PANEL
Anion gap: 6 (ref 5–15)
BUN: 10 mg/dL (ref 8–23)
CO2: 24 mmol/L (ref 22–32)
Calcium: 6.9 mg/dL — ABNORMAL LOW (ref 8.9–10.3)
Chloride: 97 mmol/L — ABNORMAL LOW (ref 98–111)
Creatinine, Ser: 0.8 mg/dL (ref 0.44–1.00)
GFR, Estimated: >60 mL/min (ref >60)
Glucose, Bld: 122 mg/dL — ABNORMAL HIGH (ref 70–99)
Potassium: 3.3 mmol/L — ABNORMAL LOW (ref 3.5–5.1)
Sodium: 127 mmol/L — ABNORMAL LOW (ref 135–145)

## 2021-02-26 LAB — GLUCOSE, CAPILLARY
Glucose-Capillary: 115 mg/dL — ABNORMAL HIGH (ref 70–99)
Glucose-Capillary: 125 mg/dL — ABNORMAL HIGH (ref 70–99)
Glucose-Capillary: 114 mg/dL — ABNORMAL HIGH (ref 70–99)
Glucose-Capillary: 117 mg/dL — ABNORMAL HIGH (ref 70–99)
Glucose-Capillary: 140 mg/dL — ABNORMAL HIGH (ref 70–99)
Glucose-Capillary: 185 mg/dL — ABNORMAL HIGH (ref 70–99)
Glucose-Capillary: 156 mg/dL — ABNORMAL HIGH (ref 70–99)
Glucose-Capillary: 139 mg/dL — ABNORMAL HIGH (ref 70–99)

## 2021-02-26 LAB — ECHOCARDIOGRAM LIMITED
Area-P 1/2: 2.45 cm2
Height: 60 in
S' Lateral: 3.4 cm
Single Plane A4C EF: 46.4 %
Weight: 2754.87 oz

## 2021-02-26 LAB — CBC
HCT: 26.2 % — ABNORMAL LOW (ref 36.0–46.0)
Hemoglobin: 8.6 g/dL — ABNORMAL LOW (ref 12.0–15.0)
MCH: 31.3 pg (ref 26.0–34.0)
MCHC: 32.8 g/dL (ref 30.0–36.0)
MCV: 95.3 fL (ref 80.0–100.0)
Platelets: 236 10*3/uL (ref 150–400)
RBC: 2.75 MIL/uL — ABNORMAL LOW (ref 3.87–5.11)
RDW: 17.2 % — ABNORMAL HIGH (ref 11.5–15.5)
WBC: 7.2 10*3/uL (ref 4.0–10.5)
nRBC: 0 % (ref 0.0–0.2)

## 2021-02-26 MED ORDER — HALOPERIDOL 1 MG PO TABS
5.0000 mg | ORAL_TABLET | Freq: Four times a day (QID) | ORAL | Status: DC | PRN
  Administered 2021-02-26 – 2021-02-27 (×3): 5 mg via ORAL
  Filled 2021-02-26 (×5): qty 1

## 2021-02-26 MED ORDER — BOOST / RESOURCE BREEZE PO LIQD CUSTOM
1.0000 | ORAL | Status: DC
  Administered 2021-02-26: 1 via ORAL

## 2021-02-26 MED ORDER — POTASSIUM CHLORIDE CRYS ER 20 MEQ PO TBCR
20.0000 meq | EXTENDED_RELEASE_TABLET | Freq: Two times a day (BID) | ORAL | Status: DC
  Administered 2021-02-26 – 2021-02-27 (×3): 20 meq via ORAL
  Filled 2021-02-26 (×3): qty 1

## 2021-02-26 MED ORDER — IOHEXOL 300 MG/ML  SOLN
100.0000 mL | Freq: Once | INTRAMUSCULAR | Status: AC | PRN
  Administered 2021-02-26: 100 mL via INTRAVENOUS

## 2021-02-26 MED ORDER — ENSURE ENLIVE PO LIQD
237.0000 mL | Freq: Two times a day (BID) | ORAL | Status: DC
  Administered 2021-02-26 – 2021-02-27 (×2): 237 mL via ORAL

## 2021-02-26 NOTE — Progress Notes (Signed)
Progress Note   Patient: Jillian Leblanc [id number] DOB: 10/22/1945 DOA: 03/21/2021     1 DOS: the patient was seen and examined on 02/26/2021   Brief hospital course: Jameerah Beber is a 76 y.o. female with medical history significant of DM2, HFpEF, PPM.   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  Had decompression and drainage at that time.  E.coli bacteremia also at that time.  Pt put on Ancef with plans to treat through Feb.   Pt re-admitted 12/20-12/30 for AMS due to CAUTI (chronic foley use due to urinary retention).  Urine grew out pseudomonas at that time, pt treated with 7 days of cefepime and discharged home on Ancef.   Pt re-admitted 1/4-1/6 with AMS due to hyponatremia from dehydration.  Hyponatremia improved with just normal saline and continuing her home PO salt tabs she had been taking for hyponatremia since prior hospital stay.  Apparently she also had episodes of hypoglycemia last time, she continued to take glipizide at home.   Pt sent in to ED at [facility] today by daughter due to increased confusion and hallucination for the past 4 days. She has not gotten out of bed and daughter reports she seems to be acting the way she did the last time she had to be hospitalized.  Daughter also confirmed poor PO intake.   Work up in ED today confirmed mild hyponatremia with sodium 128 today.  Also hypoglycemia that persisted despite D50 and D5 half gtt!   Pt has history of hypoglycemia in past resulting in hospitalization and her being taken off of insulin.  [name] on glipizide 10 mg and Jardiance at home.  She is not on insulin.  Admitted with altered mental status, hypoglycemia and failure to thrive.  Assessment and Plan: * Acute metabolic encephalopathy- (present on admission) Most likely due to some combination of hypoglycemia, hyponatremia, and/or UTI (all of which are present). Patient may have some baseline cognitive  dysfunction.  Hypoglycemia- (present on admission) Q2H CBGs.  Medication management as above.  Once blood sugars are more than 150, will change to ACHS.    Chronic indwelling Foley catheter Due to urinary retention.  Foley catheter exchanged 2/1.  Could not remove Foley catheter until mobility improves.  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 Holding ancef and putting on cefepime instead for the moment due to suspected UTI. Urine cultures pending. Blood cultures positive for Pseudomonas. PICC line was rechecked and functioning well. We will discuss with infectious disease team once cultures available.  [name] developing Pseudomonas bacteremia despite being on antibiotics for last 1 month.  Consult infectious disease today.  Chronic diastolic CHF (congestive heart failure) ([place])- (present on admission) Holding lasix since we suspect poor PO intake is responsible for hypoglycemia and hyponatremia.  Currently euvolemic.  Chronic pain- (present on admission) Continue Oxy PRN for back pain.  We will try to avoid opiates as much possible.  Pressure injury of skin- (present on admission) Pressure Injury 02/25/21 Coccyx Mid;Posterior Stage 2 -  Partial thickness loss of dermis presenting as a shallow open injury with a red, pink wound bed without slough. (Active)  02/25/21 0145  Location: Coccyx  Location Orientation: Mid;Posterior  Staging: Stage 2 -  Partial thickness loss of dermis presenting as a shallow open injury with a red, pink wound bed without slough.  Wound Description (Comments):   Present on Admission: Yes  Diabetes mellitus type 2 in obese [facility])- (present on admission) Hold all home hypoglycemics in setting of hypoglycemia. Actually: Pt has history of hypoglycemia in past resulting in hospitalization and her being taken off of insulin.  Taking glipizide and Farxiga at home.  Discontinue all hypoglycemics. Discontinue  dextrose infusion today.  We will try to feed regular diet and monitor.   Hyponatremia- (present on admission) Last month when she had this, ended up being due to hypovolemia and it resolved with isotonic saline. Continue salt tablets.  Stable.   [name] very frail and debilitated.  Recurrent hospitalizations now with cognitive dysfunction.  She will benefit with palliative care evaluation.  May even benefit with palliative care at home.  Does not need cardiac telemetry, discontinue. Continue Foley catheter for chronic urinary retention.     Subjective: Patient seen and examined.  Overnight with some confusion and impulsiveness.  [name] pleasant, impulsive and confused.  Afebrile.  Physical Exam: Vitals:   02/25/21 1056 02/25/21 1621 02/25/21 1955 02/26/21 0434  BP: 137/72 125/71 134/80 114/67  Pulse: 60 (!) 102 97 90  Resp: 16 18 16    Temp: 99.4 F (37.4 C) 100 F (37.8 C) 99.5 F (37.5 C) 99.6 F (37.6 C)  TempSrc: Oral Oral Oral Axillary  SpO2: (!) 87% 99% 97% 95%  Weight:      Height:       General: Chronically sick looking.  Debilitated and frail.  Bedbound. Cardiovascular: S1-S2 normal.  Regular rate rhythm. Respiratory: Bilateral clear. Gastrointestinal: Soft.  Nontender.  Bowel sound present. Ext: No swelling or edema.  No cyanosis.  No deformity. Neuro: Alert and awake.  Oriented x0-1.  Generalized weakness.    Data Reviewed:  Culture reports reviewed, discussed with infectious disease  Family Communication: Daughter on the phone 2/2  Disposition: Status is: Inpatient Remains inpatient appropriate because: IV antibiotics, bacteremia          Planned Discharge Destination: Home with Home Health     Time spent: 35 minutes  Author: Barb Merino, MD 02/26/2021 10:37 AM  For on call review [email or web address].

## 2021-02-26 NOTE — Progress Notes (Signed)
PHARMACY - PHYSICIAN COMMUNICATION CRITICAL VALUE ALERT - BLOOD CULTURE IDENTIFICATION (BCID)  Jillian Leblanc is an 76 y.o. female who presented to [facility] on 03/15/2021 with a chief complaint of altered mental status.   Assessment:   Patient PMH significant for: -osteomyelitis and epidural abscess of spine + E.Coli bacteremia.  Hospitalized 11/25-12/16 & plan to continue Ancef thru Feb. - CAUTI with chronic foley.  UCx + pseudomonas.  Hospitalized 12/20-12/30 and treated with Cefepime.  Discharged on Ancef.  -Increase confusion for 4 days PTA.  Blood cx + GNR; BCID + Pseudomonas with no resistance detected.   Scr at patient's baseline, estimated CrCl ~78ml/min.    Name of physician (or Provider) Contacted: Johann Capers  Current antibiotics: Cefepime 2gm IV q12h  Changes to prescribed antibiotics recommended:  [name] on recommended antibiotics - No changes needed  Results for orders placed or performed during the hospital encounter of 03/06/2021  Blood Culture ID Panel (Reflexed) (Collected: 02/25/2021  5:19 AM)  Result Value Ref Range   Enterococcus faecalis NOT DETECTED NOT DETECTED   Enterococcus Faecium NOT DETECTED NOT DETECTED   Listeria monocytogenes NOT DETECTED NOT DETECTED   Staphylococcus species NOT DETECTED NOT DETECTED   Staphylococcus aureus (BCID) NOT DETECTED NOT DETECTED   Staphylococcus epidermidis NOT DETECTED NOT DETECTED   Staphylococcus lugdunensis NOT DETECTED NOT DETECTED   Streptococcus species NOT DETECTED NOT DETECTED   Streptococcus agalactiae NOT DETECTED NOT DETECTED   Streptococcus pneumoniae NOT DETECTED NOT DETECTED   Streptococcus pyogenes NOT DETECTED NOT DETECTED   A.calcoaceticus-baumannii NOT DETECTED NOT DETECTED   Bacteroides fragilis NOT DETECTED NOT DETECTED   Enterobacterales NOT DETECTED NOT DETECTED   Enterobacter cloacae complex NOT DETECTED NOT DETECTED   Escherichia coli NOT DETECTED NOT DETECTED   Klebsiella aerogenes NOT DETECTED  NOT DETECTED   Klebsiella oxytoca NOT DETECTED NOT DETECTED   Klebsiella pneumoniae NOT DETECTED NOT DETECTED   Proteus species NOT DETECTED NOT DETECTED   Salmonella species NOT DETECTED NOT DETECTED   Serratia marcescens NOT DETECTED NOT DETECTED   Haemophilus influenzae NOT DETECTED NOT DETECTED   Neisseria meningitidis NOT DETECTED NOT DETECTED   Pseudomonas aeruginosa DETECTED (A) NOT DETECTED   Stenotrophomonas maltophilia NOT DETECTED NOT DETECTED   Candida albicans NOT DETECTED NOT DETECTED   Candida auris NOT DETECTED NOT DETECTED   Candida glabrata NOT DETECTED NOT DETECTED   Candida krusei NOT DETECTED NOT DETECTED   Candida parapsilosis NOT DETECTED NOT DETECTED   Candida tropicalis NOT DETECTED NOT DETECTED   Cryptococcus neoformans/gattii NOT DETECTED NOT DETECTED   CTX-M ESBL NOT DETECTED NOT DETECTED   Carbapenem resistance IMP NOT DETECTED NOT DETECTED   Carbapenem resistance KPC NOT DETECTED NOT DETECTED   Carbapenem resistance NDM NOT DETECTED NOT DETECTED   Carbapenem resistance VIM NOT DETECTED NOT DETECTED    Junita Push PharmD 02/26/2021  5:39 AM

## 2021-02-26 NOTE — Progress Notes (Signed)
Initial Nutrition Assessment  DOCUMENTATION CODES:   Obesity unspecified  INTERVENTION:  - continue Ensure Enlive/Plus High Protein BID, each supplement provides 350 kcal and 20 grams of protein. - will order Boost Breeze once/day, each supplement provides 250 kcal and 9 grams of protein - will order multivitamin with minerals daily - downgrade diet from Regular, thin liquids to Dysphagia 3, thin liquids.  - care order for feeding assistance.    NUTRITION DIAGNOSIS:   Increased nutrient needs related to acute illness, other (see comment) (recurrent hospitalizations x3 months) as evidenced by estimated needs.  GOAL:   Patient will meet greater than or equal to 90% of their needs  MONITOR:   PO intake, Supplement acceptance, Labs, Weight trends, Skin  REASON FOR ASSESSMENT:   Malnutrition Screening Tool  ASSESSMENT:   76 y.o. female with medical history of type 2 DM, CHF, hypothyroidism, chronic back pain, and dysrhythmia. She was admitted 11/25-12/16 due to osteomyelitis and epidural abscess of spine 2/2 E.coli. She was admitted 12/20-12/30 due to AMS d/t CAUTI. She was again admitted -1/6 for AMS d/t hyponatremia 2/2 dehydration. She presented to the ED due to increased confusion and hallucination x4 days. In the ED serum sodium was 128 mmol/l and she was noted to be hypoglycemic. She was admitted with AMS, hypoglycemia, and FTT.  Documented meal intakes since admission were 0% of breakfast and 10% of dinner yesterday.   Patient laying in bed with no visitors present at the time of RD visit. Patient's breakfast tray was at bedside. Set patient up for meal and fed her several bites. She did not like the grits, eggs, or potatoes, but was agreeable to the bacon, cranberry juice, and banana.   Noted R hand was very shaky as patient was attempting to self feed and this is normal for her, per her report.   She has not been seen by a [place] RD at any time in the past. She is  noted to be confused and seeing caterpillars in the room during RD visit.   Weight yesterday was 172 lb and weight on 09/29/20 was 206 lb. This indicates 34 lb weight loss (6.5% body weight) in the past 5 months; significant for time frame.  [name] at high risk of malnutrition but unable to confirm dx of the same at this time based on NFPE and inability to obtain reliable diet recall.    Labs reviewed; CBGs: 115, 125, 114, 117, 140 mg/dl, Na: 283 mmol/l, K: 3.3 mmol./l, Cl: 97 mmol/l, Ca: 6.9 mg/dl.  Medications reviewed; 150 mcg oral synthroid/day, 40 mg oral protonix/day, 20 mEq Klor-Con BID, 1 g NaCl BID.    NUTRITION - FOCUSED PHYSICAL EXAM:  Flowsheet Row Most Recent Value  Orbital Region No depletion  Upper Arm Region No depletion  Thoracic and Lumbar Region No depletion  Buccal Region No depletion  Temple Region No depletion  Clavicle Bone Region No depletion  Clavicle and Acromion Bone Region No depletion  Scapular Bone Region Unable to assess  Dorsal Hand No depletion  Patellar Region No depletion  Anterior Thigh Region No depletion  Posterior Calf Region No depletion  Edema (RD Assessment) Mild  [BLE]  Hair Reviewed  Eyes Reviewed  Mouth Unable to assess  Skin Reviewed  Nails Reviewed       Diet Order:   Diet Order             DIET DYS 3 Room service appropriate? Yes; Fluid consistency: Thin  Diet effective  now                   EDUCATION NEEDS:   Not appropriate for education at this time  Skin:  Skin Assessment: Skin Integrity Issues: Skin Integrity Issues:: Stage II, Other (Comment) Stage II: coccyx Other: MASD R lower breast  Last BM:  2/3 (type 5 x2, one medium and one large)  Height:   Ht Readings from Last 1 Encounters:  02/25/21 5' (1.524 m)    Weight:   Wt Readings from Last 1 Encounters:  02/25/21 78.1 kg     BMI:  Body mass index is 33.63 kg/m.   Estimated Nutritional Needs:  Kcal:  1650-1850 kcal Protein:  85-100  grams Fluid:  >/= 2 L/day     Trenton Gammon, MS, RD, LDN Inpatient Clinical Dietitian RD pager # available in [facility]  After hours/weekend pager # available in [facility]

## 2021-02-26 NOTE — Assessment & Plan Note (Signed)
Pressure Injury 02/25/21 Coccyx Mid;Posterior Stage 2 -  Partial thickness loss of dermis presenting as a shallow open injury with a red, pink wound bed without slough. (Active)  02/25/21 0145  Location: Coccyx  Location Orientation: Mid;Posterior  Staging: Stage 2 -  Partial thickness loss of dermis presenting as a shallow open injury with a red, pink wound bed without slough.  Wound Description (Comments):   Present on Admission: Yes

## 2021-02-26 NOTE — Consult Note (Signed)
[facility]    Date of Admission:  03/06/2021     Reason for Consult:Pseudomonas bacteremia     Referring Physician: Dr Jerral Ralph  Current antibiotics: Cefepime 03/06/2021--present   Previous antibiotics: Cefazolin PTA  ASSESSMENT:    76 y.o. female admitted with:  Pseudomonas aeruginosa bacteremia Prior E coli bacteremia complicated by discitis/OM and epidural abscess: Status post laminectomy, evacuation of epidural abscess, and hardware placement 01/04/2021. Sepsis Chronic indwelling Foley catheter Type 2 diabetes  RECOMMENDATIONS:    Continue cefepime Lumbar CT with contrast (MRI previously noted unable to be obtained due to PPM and bladder stimulator) Repeat limited 2D echo.  If negative, probably should pursue TEE given her bacteremia with Pseudomonas and pacemaker in place Will repeat blood cultures given complicated history to ensure no persistent bacteremia Remove PICC line Dr Luciana Axe available over the weekend.  Dr Thedore Mins is here Monday.   Principal Problem:   Sepsis due to Pseudomonas [facility]) Active Problems:   Hyponatremia   Diabetes mellitus type 2 in obese (HCC)   Pressure injury of skin   Chronic pain   Acute metabolic encephalopathy   Chronic diastolic CHF (congestive heart failure) (HCC)   E coli bacteremia   Discitis of lumbar region   Chronic indwelling Foley catheter   Hypoglycemia   Encephalopathy   Bacteremia due to Pseudomonas   MEDICATIONS:    Scheduled Meds:  allopurinol  300 mg Oral Daily   amitriptyline  100 mg Oral QHS   Chlorhexidine Gluconate Cloth  6 each Topical Daily   citalopram  20 mg Oral Daily   enoxaparin (LOVENOX) injection  40 mg Subcutaneous Q24H   feeding supplement  237 mL Oral BID BM   gabapentin  300 mg Oral QHS   levothyroxine  150 mcg Oral QAC breakfast   mouth rinse  15 mL Mouth Rinse BID   oxybutynin  10 mg Oral Daily   pantoprazole  40 mg Oral Daily   potassium chloride  20 mEq Oral  BID   pramipexole  1.5 mg Oral TID   QUEtiapine  25 mg Oral QHS   sodium chloride flush  10-40 mL Intracatheter Q12H   sodium chloride  1 g Oral BID WC   Continuous Infusions:  ceFEPime (MAXIPIME) IV 2 g (02/26/21 0637)   PRN Meds:.acetaminophen **OR** acetaminophen, alum & mag hydroxide-simeth, ondansetron **OR** ondansetron (ZOFRAN) IV, oxyCODONE, traZODone  HPI:    Jillian Leblanc is a 76 y.o. female with a complicated past medical history that includes type 2 diabetes, heart failure with preserved ejection fraction, pacemaker placement, history of interstitial cystitis with bladder stimulator, recent E. coli bacteremia complicated by discitis/osteomyelitis and epidural abscess (status post evacuation and laminectomy 01/04/2021), and recent Pseudomonas aeruginosa positive urine cultures who is admitted with increased confusion and hallucinations found to have hyponatremia and sepsis with blood cultures positive for Pseudomonas aeruginosa possibly secondary to a urinary source.  She is currently on cefazolin due to her history of osteomyelitis and epidural abscess of the spine secondary to E. coli.  She was scheduled to conclude her antibiotics for this issue on 03/01/2021.  She was scheduled for ID follow-up with Dr. Thedore Mins today prior to having to be admitted.  During her previous admission in December she was noted to have Pseudomonas aeruginosa in the urine that was felt to be a contaminant.  She was readmitted at the end of December for encephalopathy due to CAUTI (has chronic Foley due to urinary retention).  Urine  culture grew out Pseudomonas aeruginosa again.  She was treated with 7 days of cefepime prior to being discharged back home on Ancef.  Blood cultures at that time were negative.  Had another admission in early January for encephalopathy due to hyponatremia and dehydration.  Now admitted with encephalopathy, hyponatremia, and sepsis with repeat blood cultures growing Pseudomonas  aeruginosa.  Her urinalysis was notable for negative nitrites, pyuria, and few bacteria from a catheterized urine specimen.  Urine cultures have gram-negative rods presumably Pseudomonas as well.  She has been started on cefepime and we are consulted for further recommendations.   Past Medical History:  Diagnosis Date   CHF (congestive heart failure) (HCC)    Chronic back pain    Diabetes mellitus without complication (HCC)    Dysrhythmia    Hypothyroidism    Interstitial cystitis    Presence of permanent cardiac pacemaker    Thyroid disease     Social History   Tobacco Use   Smoking status: Never   Smokeless tobacco: Never  Vaping Use   Vaping Use: Never used  Substance Use Topics   Alcohol use: No   Drug use: Never    Family History  Problem Relation Age of Onset   Immunodeficiency Neg Hx     Allergies  Allergen Reactions   Elmiron [Pentosan Polysulfate] Other (See Comments)    "Hair fell out"   Naloxone Other (See Comments)    Hallucinations/Confusion/Nightmares    Review of Systems  Constitutional:  Positive for chills and malaise/fatigue. Negative for fever.  HENT: Negative.    Respiratory: Negative.    Cardiovascular: Negative.   Gastrointestinal: Negative.   Genitourinary: Negative.   Musculoskeletal:  Positive for back pain.  Skin: Negative.   All other systems reviewed and are negative.  OBJECTIVE:   Blood pressure 114/67, pulse 90, temperature 99.6 F (37.6 C), temperature source Axillary, resp. rate 16, height 5' (1.524 m), weight 78.1 kg, SpO2 95 %. Body mass index is 33.63 kg/m.  Physical Exam Constitutional:      Comments: Chronically ill appearing woman, lying in bed, appears uncomfortable.   HENT:     Head: Normocephalic and atraumatic.  Eyes:     Extraocular Movements: Extraocular movements intact.     Conjunctiva/sclera: Conjunctivae normal.  Cardiovascular:     Rate and Rhythm: Normal rate and regular rhythm.     Heart sounds: No  murmur heard. Pulmonary:     Effort: Pulmonary effort is normal. No respiratory distress.     Breath sounds: Normal breath sounds.  Abdominal:     General: There is no distension.     Palpations: Abdomen is soft.     Tenderness: There is no abdominal tenderness.  Musculoskeletal:     Cervical back: Normal range of motion and neck supple.     Comments: UE PICC in place. Foley in place.   Skin:    General: Skin is warm and dry.     Findings: No rash.  Neurological:     General: No focal deficit present.     Motor: No weakness.     Comments: She is alert and oriented to person and place.  She thinks it is 2022.  Psychiatric:        Mood and Affect: Mood normal.        Behavior: Behavior normal.     Lab Results: Lab Results  Component Value Date   WBC 7.2 02/26/2021   HGB 8.6 (L) 02/26/2021   HCT 26.2 (  L) 02/26/2021   MCV 95.3 02/26/2021   PLT 236 02/26/2021    Lab Results  Component Value Date   NA 127 (L) 02/26/2021   K 3.3 (L) 02/26/2021   CO2 24 02/26/2021   GLUCOSE 122 (H) 02/26/2021   BUN 10 02/26/2021   CREATININE 0.80 02/26/2021   CALCIUM 6.9 (L) 02/26/2021   GFRNONAA >60 02/26/2021   GFRAA 56 (L) 08/16/2019    Lab Results  Component Value Date   ALT <5 03/16/2021   AST 21 02/27/2021   GGT 109 (H) 11/23/2020   ALKPHOS 199 (H) 03/16/2021   BILITOT 0.6 03/10/2021       Component Value Date/Time   CRP 30.0 (H) 12/21/2020 0134       Component Value Date/Time   ESRSEDRATE 95 (H) 12/21/2020 0134    I have reviewed the micro and lab results in Epic.  Imaging: CT Head Wo Contrast  Result Date: 02/25/2021 CLINICAL DATA:  Altered mental status EXAM: CT HEAD WITHOUT CONTRAST TECHNIQUE: Contiguous axial images were obtained from the base of the skull through the vertex without intravenous contrast. RADIATION DOSE REDUCTION: This exam was performed according to the departmental dose-optimization program which includes automated exposure control, adjustment  of the mA and/or kV according to patient size and/or use of iterative reconstruction technique. COMPARISON:  CT head 01/27/2021 FINDINGS: Brain: No acute intracranial hemorrhage, mass effect, or herniation. No extra-axial fluid collections. No evidence of acute territorial infarct. No hydrocephalus. Mild cortical volume loss. Mild chronic microvascular ischemic changes. Vascular: No hyperdense vessel or unexpected calcification. Skull: Normal. Negative for fracture or focal lesion. Sinuses/Orbits: No acute finding. Other: None. IMPRESSION: Chronic changes with no acute intracranial process identified. Electronically Signed   By: Ofilia Neas M.D.   On: 03/01/2021 21:00   DG Chest Port 1 View  Result Date: 03/06/2021 CLINICAL DATA:  Dehydration, fell 2 weeks ago, confusion EXAM: PORTABLE CHEST 1 VIEW COMPARISON:  01/27/2021 FINDINGS: Single frontal view of the chest demonstrates stable dual lead pacer and right-sided PICC. Cardiac silhouette is unremarkable. No airspace disease, effusion, or pneumothorax. No acute bony abnormality. IMPRESSION: 1. No acute intrathoracic process. Electronically Signed   By: Randa Ngo M.D.   On: 03/03/2021 21:14     Imaging independently reviewed in Epic.  Raynelle Highland for Infectious Disease [facility] [phone] pager 02/26/2021, 11:00 AM

## 2021-02-26 NOTE — Progress Notes (Signed)
Remote pacemaker transmission.   

## 2021-02-26 NOTE — Progress Notes (Signed)
Echocardiogram 2D Echocardiogram has been performed.  Eduard Roux 02/26/2021, 3:37 PM

## 2021-02-26 NOTE — Evaluation (Signed)
Physical Therapy Evaluation Patient Details Name: Jillian Leblanc MRN: [id number] DOB: [birth year] Today's Date: 02/26/2021  History of Present Illness  Rahwa Baz is a 76 y.o. female with medical history significant of DM2, HFpEF, hospitalization 11/225-01/08/21 for L4-S1 osteomyelitis and abscess, hospitalization 12/20-12/30/22 for CAUTI,  chronic subacute dislocation of left shoulder, right rTSA is now admitted with hyponatremia and confusion.  Clinical Impression  Pt admitted with above diagnosis. Total assist for bed mobility, pt unable to come up to sitting at edge of bed nor was she able to fully roll to L nor to R 2* physical resistance with attempted mobility. She is oriented to self, month/year, but stated, "[place]" when asked her location. She is not a reliable historian, so prior level of function is not known. She did DC from a recent hospitalization with a hoyer lift and WC. If family is unable to provide 65* care, she will need SNF.  Pt currently with functional limitations due to the deficits listed below (see PT Problem List). Pt will benefit from skilled PT to increase their independence and safety with mobility to allow discharge to the venue listed below.          Recommendations for follow up therapy are one component of a multi-disciplinary discharge planning process, led by the attending physician.  Recommendations may be updated based on patient status, additional functional criteria and insurance authorization.  Follow Up Recommendations PT at Long-term acute care hospital    Assistance Recommended at Discharge Frequent or constant Supervision/Assistance  Patient can return home with the following  Two people to help with walking and/or transfers;A lot of help with bathing/dressing/bathroom;Assistance with cooking/housework;Direct supervision/assist for medications management;Direct supervision/assist for financial management;Assist for transportation;Help with stairs or  ramp for entrance    Equipment Recommendations None recommended by PT  Recommendations for Other Services       Functional Status Assessment Patient has had a recent decline in their functional status and demonstrates the ability to make significant improvements in function in a reasonable and predictable amount of time.     Precautions / Restrictions Precautions Precautions: Fall Precaution Comments: poor shoulder ROM, incontinent of BM Restrictions Weight Bearing Restrictions: No      Mobility  Bed Mobility Overal bed mobility: Needs Assistance Bed Mobility: Rolling, Supine to Sit Rolling: Total assist   Supine to sit: Total assist     General bed mobility comments: attempted rolling L and R with total assist, pt unable to completely roll to either side, max verbal cues for technique, pt physically resisted movement; attempted supine to sit, unable to come to full sit with total assist, pt physically resisted movement. Will need +2 assist or mechanical lift.    Transfers                   General transfer comment: will need mechanical lift    Ambulation/Gait                  Stairs            Wheelchair Mobility    Modified Rankin (Stroke Patients Only)       Balance                                             Pertinent Vitals/Pain Pain Assessment Pain Score: 0-No pain    Home Living Family/patient expects to  be discharged to:: Unsure Living Arrangements: Spouse/significant other                 Additional Comments: Patient lives with spouse and has assistance of daughter but unsure how much.    Prior Function Prior Level of Function : Needs assist             Mobility Comments: Unsure. Appears mobility limited. She was transferred to a wc on 1/6 in the ED to get in car - so was transferring a month ago.. She returned home from a prior hospitilzation with a hospital bed and hoyer lift. ADLs Comments:  needs assistance     Hand Dominance   Dominant Hand: Right    Extremity/Trunk Assessment   Upper Extremity Assessment Upper Extremity Assessment: Defer to OT evaluation    Lower Extremity Assessment Lower Extremity Assessment: Generalized weakness;Difficult to assess due to impaired cognition       Communication   Communication: No difficulties  Cognition Arousal/Alertness: Awake/alert Behavior During Therapy: Restless Overall Cognitive Status: No family/caregiver present to determine baseline cognitive functioning                                 General Comments: Confused.  Word finding difficulties. Oriented to self and to month/year, but not to location. When asked where she is, she said, "[place]".        General Comments      Exercises     Assessment/Plan    PT Assessment Patient needs continued PT services  PT Problem List Decreased mobility;Decreased activity tolerance       PT Treatment Interventions Therapeutic activities;Therapeutic exercise;Functional mobility training;Balance training;Patient/family education    PT Goals (Current goals can be found in the Care Plan section)  Acute Rehab PT Goals PT Goal Formulation: Patient unable to participate in goal setting Time For Goal Achievement: 03/12/21 Potential to Achieve Goals: Fair    Frequency Min 2X/week     Co-evaluation               AM-PAC PT "6 Clicks" Mobility  Outcome Measure Help needed turning from your back to your side while in a flat bed without using bedrails?: Total Help needed moving from lying on your back to sitting on the side of a flat bed without using bedrails?: Total Help needed moving to and from a bed to a chair (including a wheelchair)?: Total Help needed standing up from a chair using your arms (e.g., wheelchair or bedside chair)?: Total Help needed to walk in hospital room?: Total Help needed climbing 3-5 steps with a railing? : Total 6 Click Score:  6    End of Session   Activity Tolerance: Patient tolerated treatment well Patient left: in bed;with bed alarm set Nurse Communication: Mobility status;Need for lift equipment PT Visit Diagnosis: Muscle weakness (generalized) (M62.81);Difficulty in walking, not elsewhere classified (R26.2)    Time: [id number] PT Time Calculation (min) (ACUTE ONLY): 10 min   Charges:   PT Evaluation $PT Eval Moderate Complexity: 1 Mod         Philomena Doheny PT 02/26/2021  Acute Rehabilitation Services Pager [phone] Office [phone]

## 2021-02-27 ENCOUNTER — Inpatient Hospital Stay (HOSPITAL_COMMUNITY): Payer: Medicare Other | Admitting: Certified Registered Nurse Anesthetist

## 2021-02-27 ENCOUNTER — Inpatient Hospital Stay (HOSPITAL_COMMUNITY): Payer: Medicare Other

## 2021-02-27 DIAGNOSIS — R579 Shock, unspecified: Secondary | ICD-10-CM | POA: Diagnosis not present

## 2021-02-27 DIAGNOSIS — G9341 Metabolic encephalopathy: Secondary | ICD-10-CM | POA: Diagnosis not present

## 2021-02-27 LAB — COMPREHENSIVE METABOLIC PANEL
ALT: 6 U/L (ref 0–44)
AST: 32 U/L (ref 15–41)
Albumin: <1.5 g/dL — ABNORMAL LOW (ref 3.5–5.0)
Alkaline Phosphatase: 113 U/L (ref 38–126)
Anion gap: 10 (ref 5–15)
BUN: 17 mg/dL (ref 8–23)
CO2: 19 mmol/L — ABNORMAL LOW (ref 22–32)
Calcium: 6.6 mg/dL — ABNORMAL LOW (ref 8.9–10.3)
Chloride: 107 mmol/L (ref 98–111)
Creatinine, Ser: 1.36 mg/dL — ABNORMAL HIGH (ref 0.44–1.00)
GFR, Estimated: 41 mL/min — ABNORMAL LOW (ref >60)
Glucose, Bld: 246 mg/dL — ABNORMAL HIGH (ref 70–99)
Potassium: 3.7 mmol/L (ref 3.5–5.1)
Sodium: 136 mmol/L (ref 135–145)
Total Bilirubin: 0.5 mg/dL (ref 0.3–1.2)
Total Protein: 4.6 g/dL — ABNORMAL LOW (ref 6.5–8.1)

## 2021-02-27 LAB — GLUCOSE, CAPILLARY
Glucose-Capillary: 118 mg/dL — ABNORMAL HIGH (ref 70–99)
Glucose-Capillary: 149 mg/dL — ABNORMAL HIGH (ref 70–99)
Glucose-Capillary: 180 mg/dL — ABNORMAL HIGH (ref 70–99)
Glucose-Capillary: 193 mg/dL — ABNORMAL HIGH (ref 70–99)
Glucose-Capillary: 194 mg/dL — ABNORMAL HIGH (ref 70–99)
Glucose-Capillary: 252 mg/dL — ABNORMAL HIGH (ref 70–99)
Glucose-Capillary: 259 mg/dL — ABNORMAL HIGH (ref 70–99)
Glucose-Capillary: 260 mg/dL — ABNORMAL HIGH (ref 70–99)

## 2021-02-27 LAB — BLOOD GAS, ARTERIAL
Acid-base deficit: 15.9 mmol/L — ABNORMAL HIGH (ref 0.0–2.0)
Acid-base deficit: 11.9 mmol/L — ABNORMAL HIGH (ref 0.0–2.0)
Acid-base deficit: 8.1 mmol/L — ABNORMAL HIGH (ref 0.0–2.0)
Bicarbonate: 15.3 mmol/L — ABNORMAL LOW (ref 20.0–28.0)
Bicarbonate: 15.6 mmol/L — ABNORMAL LOW (ref 20.0–28.0)
Bicarbonate: 18.7 mmol/L — ABNORMAL LOW (ref 20.0–28.0)
FIO2: 100
FIO2: 100
FIO2: 100
O2 Saturation: 98.3 %
O2 Saturation: 82.3 %
O2 Saturation: 75.6 %
Patient temperature: 98.6
Patient temperature: 99.8
Patient temperature: 99.1
pCO2 arterial: 67.5 mmHg (ref 32.0–48.0)
pCO2 arterial: 44.9 mmHg (ref 32.0–48.0)
pCO2 arterial: 47.5 mmHg (ref 32.0–48.0)
pH, Arterial: 6.986 — CL (ref 7.350–7.450)
pH, Arterial: 7.171 — CL (ref 7.350–7.450)
pH, Arterial: 7.222 — ABNORMAL LOW (ref 7.350–7.450)
pO2, Arterial: 257 mmHg — ABNORMAL HIGH (ref 83.0–108.0)
pO2, Arterial: 63.2 mmHg — ABNORMAL LOW (ref 83.0–108.0)
pO2, Arterial: 50.4 mmHg — ABNORMAL LOW (ref 83.0–108.0)

## 2021-02-27 LAB — CBC
HCT: 28.1 % — ABNORMAL LOW (ref 36.0–46.0)
Hemoglobin: 8.8 g/dL — ABNORMAL LOW (ref 12.0–15.0)
MCH: 30.7 pg (ref 26.0–34.0)
MCHC: 31.3 g/dL (ref 30.0–36.0)
MCV: 97.9 fL (ref 80.0–100.0)
Platelets: 207 10*3/uL (ref 150–400)
RBC: 2.87 MIL/uL — ABNORMAL LOW (ref 3.87–5.11)
RDW: 17.2 % — ABNORMAL HIGH (ref 11.5–15.5)
WBC: 0.9 10*3/uL — CL (ref 4.0–10.5)
nRBC: 0 % (ref 0.0–0.2)

## 2021-02-27 LAB — TYPE AND SCREEN
ABO/RH(D): A NEG
Antibody Screen: NEGATIVE

## 2021-02-27 LAB — URINE CULTURE: Culture: >=100000 — AB

## 2021-02-27 LAB — PROTIME-INR
INR: 1.6 — ABNORMAL HIGH (ref 0.8–1.2)
Prothrombin Time: 18.6 seconds — ABNORMAL HIGH (ref 11.4–15.2)

## 2021-02-27 LAB — CULTURE, BLOOD (ROUTINE X 2)
Special Requests: ADEQUATE
Special Requests: ADEQUATE

## 2021-02-27 LAB — LACTIC ACID, PLASMA: Lactic Acid, Venous: 6.5 mmol/L (ref 0.5–1.9)

## 2021-02-27 MED ORDER — DOCUSATE SODIUM 50 MG/5ML PO LIQD: 100.0000 mg | Freq: Two times a day (BID) | ORAL | Status: DC

## 2021-02-27 MED ORDER — FENTANYL CITRATE (PF) 100 MCG/2ML IJ SOLN: 25.0000 ug | Freq: Once | INTRAMUSCULAR | Status: AC

## 2021-02-27 MED ORDER — DOCUSATE SODIUM 50 MG/5ML PO LIQD: 100.0000 mg | Freq: Two times a day (BID) | ORAL | Status: DC | PRN

## 2021-02-27 MED ORDER — FENTANYL BOLUS VIA INFUSION
25.0000 ug | INTRAVENOUS | Status: DC | PRN
  Filled 2021-02-27: qty 100

## 2021-02-27 MED ORDER — NOREPINEPHRINE 16 MG/250ML-% IV SOLN
0.0000 ug/min | INTRAVENOUS | Status: DC
  Administered 2021-02-27 – 2021-02-28 (×2): 60 ug/min via INTRAVENOUS
  Filled 2021-02-27 (×2): qty 250

## 2021-02-27 MED ORDER — DEXMEDETOMIDINE HCL IN NACL 200 MCG/50ML IV SOLN: 0.0000 ug/kg/h | INTRAVENOUS | Status: DC

## 2021-02-27 MED ORDER — POLYETHYLENE GLYCOL 3350 17 G PO PACK: 17.0000 g | PACK | Freq: Every day | ORAL | Status: DC | PRN

## 2021-02-27 MED ORDER — MORPHINE SULFATE (PF) 2 MG/ML IV SOLN: 2.0000 mg | INTRAVENOUS | Status: DC | PRN

## 2021-02-27 MED ORDER — PHENYLEPHRINE HCL-NACL 20-0.9 MG/250ML-% IV SOLN
0.0000 ug/min | INTRAVENOUS | Status: DC
  Administered 2021-02-27: 20 ug/min via INTRAVENOUS
  Administered 2021-02-28: 210 ug/min via INTRAVENOUS
  Filled 2021-02-27 (×2): qty 250

## 2021-02-27 MED ORDER — NOREPINEPHRINE 4 MG/250ML-% IV SOLN
0.0000 ug/min | INTRAVENOUS | Status: DC
  Administered 2021-02-27: 40 ug/min via INTRAVENOUS
  Administered 2021-02-27: 34 ug/min via INTRAVENOUS
  Filled 2021-02-27 (×2): qty 250

## 2021-02-27 MED ORDER — PANTOPRAZOLE SODIUM 40 MG IV SOLR
40.0000 mg | Freq: Every day | INTRAVENOUS | Status: DC
  Administered 2021-02-27: 40 mg via INTRAVENOUS
  Filled 2021-02-27: qty 40

## 2021-02-27 MED ORDER — INSULIN ASPART 100 UNIT/ML IJ SOLN
0.0000 [IU] | Freq: Three times a day (TID) | INTRAMUSCULAR | Status: DC
  Administered 2021-02-27: 3 [IU] via SUBCUTANEOUS

## 2021-02-27 MED ORDER — MIDAZOLAM HCL 2 MG/2ML IJ SOLN
INTRAMUSCULAR | Status: AC
  Filled 2021-02-27: qty 2

## 2021-02-27 MED ORDER — HYDROCORTISONE SOD SUC (PF) 100 MG IJ SOLR
100.0000 mg | Freq: Three times a day (TID) | INTRAMUSCULAR | Status: DC
  Administered 2021-02-27 – 2021-02-28 (×2): 100 mg via INTRAVENOUS
  Filled 2021-02-27 (×2): qty 2

## 2021-02-27 MED ORDER — ALBUMIN HUMAN 5 % IV SOLN
25.0000 g | Freq: Once | INTRAVENOUS | Status: AC
Start: 2021-02-28 — End: 2021-02-27
  Administered 2021-02-27: 25 g via INTRAVENOUS

## 2021-02-27 MED ORDER — SODIUM BICARBONATE 8.4 % IV SOLN
INTRAVENOUS | Status: AC
  Administered 2021-02-27: 50 meq via INTRAVENOUS
  Filled 2021-02-27: qty 100

## 2021-02-27 MED ORDER — EPINEPHRINE HCL 5 MG/250ML IV SOLN IN NS
0.5000 ug/min | INTRAVENOUS | Status: DC
  Administered 2021-02-27: 0.5 ug/min via INTRAVENOUS
  Administered 2021-02-28 (×3): 60 ug/min via INTRAVENOUS
  Administered 2021-02-28: 39 ug/min via INTRAVENOUS
  Filled 2021-02-27 (×5): qty 250

## 2021-02-27 MED ORDER — INSULIN ASPART 100 UNIT/ML IJ SOLN: 0.0000 [IU] | Freq: Every day | INTRAMUSCULAR | Status: DC

## 2021-02-27 MED ORDER — SODIUM CHLORIDE 0.9 % IV SOLN: INTRAVENOUS | Status: DC | PRN

## 2021-02-27 MED ORDER — ALBUMIN HUMAN 5 % IV SOLN
25.0000 g | Freq: Once | INTRAVENOUS | Status: DC
  Filled 2021-02-27: qty 500

## 2021-02-27 MED ORDER — SODIUM BICARBONATE 8.4 % IV SOLN
INTRAVENOUS | Status: DC
  Filled 2021-02-27: qty 150
  Filled 2021-02-27: qty 1000
  Filled 2021-02-27: qty 150
  Filled 2021-02-27: qty 1000

## 2021-02-27 MED ORDER — FENTANYL CITRATE (PF) 100 MCG/2ML IJ SOLN
INTRAMUSCULAR | Status: AC
  Administered 2021-02-27: 25 ug via INTRAVENOUS
  Filled 2021-02-27: qty 2

## 2021-02-27 MED ORDER — FENTANYL 2500MCG IN NS 250ML (10MCG/ML) PREMIX INFUSION
25.0000 ug/h | INTRAVENOUS | Status: DC
  Administered 2021-02-27: 25 ug/h via INTRAVENOUS
  Filled 2021-02-27: qty 250

## 2021-02-27 MED ORDER — SODIUM CHLORIDE 0.9 % IV SOLN
1.2500 ng/kg/min | INTRAVENOUS | Status: DC
  Administered 2021-02-27: 5 ng/kg/min via INTRAVENOUS
  Filled 2021-02-27 (×2): qty 1

## 2021-02-27 MED ORDER — POLYETHYLENE GLYCOL 3350 17 G PO PACK: 17.0000 g | PACK | Freq: Every day | ORAL | Status: DC

## 2021-02-27 MED ORDER — VASOPRESSIN 20 UNITS/100 ML INFUSION FOR SHOCK
0.0000 [IU]/min | INTRAVENOUS | Status: DC
  Administered 2021-02-27: 0.03 [IU]/min via INTRAVENOUS
  Administered 2021-02-28: 0.04 [IU]/min via INTRAVENOUS
  Filled 2021-02-27 (×3): qty 100

## 2021-02-27 MED ORDER — SODIUM CHLORIDE 0.9 % IV SOLN
1.0000 g | Freq: Three times a day (TID) | INTRAVENOUS | Status: DC
  Administered 2021-02-28: 1 g via INTRAVENOUS
  Filled 2021-02-27 (×2): qty 1

## 2021-02-27 MED FILL — Medication: Qty: 1 | Status: AC

## 2021-02-27 NOTE — Progress Notes (Signed)
[facility] called and stated pt was in [place], went to room to assess patient, found her slumped to side with large amount of vomit on gown and bed, initially patient responded but went quickly unresponsive and Code was called and protocols initiated

## 2021-02-27 NOTE — Consult Note (Signed)
NAME:  Jillian Leblanc, MRN:  [id number], DOB:  1945-09-05, LOS: 2 ADMISSION DATE:  03/16/2021, CONSULTATION DATE:  02/27/21 REFERRING MD:  Barb Merino, MD CHIEF COMPLAINT:  code   History of Present Illness:  Jillian Leblanc is a 76 year old woman, with history of diabetes mellitus, hypothyroidism, and congestive heart failure who has had multiple hospital admissions for infections recently.    She was admitted 11/25-12/16 for E. coli epidural abscess and osteomyelitis and E. coli bacteremia. She was admitted 12/20-12/30 for urinary tract infection due to chronic Foley.  She was admitted 1/4-1/6 for altered mentation due to hyponatremia.  She was admitted on 2/1 for increased altered mental status.  She was found to be bacteremic with Pseudomonas aeruginosa likely from urinary tract and source with urine culture growing Pseudomonas as well.  She has been on cefepime since 2/1.  CODE BLUE was called today as patient was found to have ventricular tachycardia and became unresponsive.  CPR was initiated and received 3 rounds of epinephrine and shocked for V. fib with return of spontaneous circulation.  10 to 15 minutes of ACLS was performed.  She was intubated.  She was then transferred to the ICU and PCCM consulted for further management.  Interosseous access was obtained on the right tibial plateau and peripheral Levophed and IV fluids started.  Initial blood gas showed pH 6.9, PCO2 67.5, PO2 257.  1 ampoule of bicarb was given.  Patient's family was updated and patient's daughter was at the bedside all questions were answered.  Pertinent  Medical History   Past Medical History:  Diagnosis Date   CHF (congestive heart failure) (HCC)    Chronic back pain    Diabetes mellitus without complication ([place])    Dysrhythmia    Hypothyroidism    Interstitial cystitis    Presence of permanent cardiac pacemaker    Thyroid disease      Significant Hospital Events: Including procedures, antibiotic  start and stop dates in addition to other pertinent events   2/1 admitted to the hospital 2/4 transferred to the ICU after v. Tach cardiac arrest, intubated, started on vasopressors  Interim History / Subjective:    Objective   Blood pressure 114/72, pulse (!) 108, temperature 98.9 F (37.2 C), temperature source Oral, resp. rate 18, height 5' (1.524 m), weight 78.1 kg, SpO2 100 %.    Vent Mode: PRVC FiO2 (%):  [100 %] 100 % Set Rate:  [20 bmp] 20 bmp Vt Set:  [400 mL] 400 mL PEEP:  [5 cmH20] 5 cmH20 Plateau Pressure:  [11 cmH20] 11 cmH20   Intake/Output Summary (Last 24 hours) at 02/27/2021 2000 Last data filed at 02/27/2021 1800 Gross per 24 hour  Intake 720 ml  Output 2700 ml  Net -1980 ml   Filed Weights   03/23/2021 1843 02/25/21 0200  Weight: 77.1 kg 78.1 kg    Examination: General: Chronically ill elderly woman, intubated, agonal respirations HENT: [place]/AT, sclera anicteric, moist mucous membranes Lungs: Coarse respirations, no wheezing or rhonchi. Cardiovascular: Tachycardic, no murmurs Abdomen: Soft, non-distended, diminished bowel sounds Extremities: cool, no edema Neuro: sedated GU: foley in place  Resolved Hospital Problem list     Assessment & Plan:  Septic Shock Pseudomonas Bacteremia Pseudomonas UTI - continue cefepime  - f/u repeat blood cultures - continue levophed + vasopressin, wean for MAP >65  V. Fib Cardiac Arrest Chronic diastolic HF Sick Sinus Syndrome In setting of sepsis - supportive care - will hold on amiodarone at this  time - may require TEE to evaluate pacemaker leads  Severe Acidosis - in setting of cardiac arrest and shock - given amp of bicarb - repeat ABG  Acute Hypoxemic Respiratory Failure - continue mechanical ventilatory support - fentanyl and precedex for sedation  Discitis of Lumbar Region - POA - cefpime, ID following  Acute Metabolic Encephalopathy - due to sepsis and cardiac arrest  Pressure injury of skin -  POA - wound care following - pressure injury of coccyx, stage 2  Hyponatremia - monitor  Best Practice (right click and "Reselect all SmartList Selections" daily)   Diet/type: NPO w/ meds via tube DVT prophylaxis: prophylactic heparin  GI prophylaxis: PPI Lines: Central line Foley:  Yes, and it is still needed Code Status:  full code Last date of multidisciplinary goals of care discussion [updated daughter at bedside 2/4]  Labs   CBC: Recent Labs  Lab 03/09/2021 1928 02/25/21 0519 02/26/21 0313  WBC 13.4* 12.9* 7.2  NEUTROABS 11.5*  --   --   HGB 11.0* 12.6 8.6*  HCT 33.1* 38.3 26.2*  MCV 91.9 95.5 95.3  PLT 327 263 592    Basic Metabolic Panel: Recent Labs  Lab 03/14/2021 1928 02/25/21 0519 02/26/21 0313  NA 128* 128* 127*  K 4.4 4.1 3.3*  CL 96* 98 97*  CO2 '24 22 24  ' GLUCOSE 55* 91 122*  BUN '12 11 10  ' CREATININE 0.95 0.93 0.80  CALCIUM 7.6* 7.9* 6.9*   GFR: Estimated Creatinine Clearance: 57.8 mL/min (by C-G formula based on SCr of 0.8 mg/dL). Recent Labs  Lab 03/06/2021 1928 02/25/21 0519 02/26/21 0313  WBC 13.4* 12.9* 7.2  LATICACIDVEN 1.7  --   --     Liver Function Tests: Recent Labs  Lab 03/11/2021 1928  AST 21  ALT <5  ALKPHOS 199*  BILITOT 0.6  PROT 7.3  ALBUMIN 2.4*   No results for input(s): LIPASE, AMYLASE in the last 168 hours. Recent Labs  Lab 03/12/2021 2247  AMMONIA 13    ABG    Component Value Date/Time   PHART 6.986 (LL) 02/27/2021 1843   PCO2ART 67.5 (HH) 02/27/2021 1843   PO2ART 257 (H) 02/27/2021 1843   HCO3 15.3 (L) 02/27/2021 1843   TCO2 27 01/04/2021 2044   ACIDBASEDEF 15.9 (H) 02/27/2021 1843   O2SAT 98.3 02/27/2021 1843     Coagulation Profile: No results for input(s): INR, PROTIME in the last 168 hours.  Cardiac Enzymes: No results for input(s): CKTOTAL, CKMB, CKMBINDEX, TROPONINI in the last 168 hours.  HbA1C: Hgb A1c MFr Bld  Date/Time Value Ref Range Status  01/28/2021 06:30 AM 5.5 4.8 - 5.6 % Final     Comment:    (NOTE) Pre diabetes:          5.7%-6.4%  Diabetes:              >6.4%  Glycemic control for   <7.0% adults with diabetes   11/23/2020 05:33 PM 8.4 (H) 4.8 - 5.6 % Final    Comment:    (NOTE) Pre diabetes:          5.7%-6.4%  Diabetes:              >6.4%  Glycemic control for   <7.0% adults with diabetes     CBG: Recent Labs  Lab 02/27/21 0439 02/27/21 0715 02/27/21 1116 02/27/21 1556 02/27/21 1839  GLUCAP 180* 149* 252* 118* 193*    Review of Systems:   Unable to perform review of systems  as patient is intuabted  Past Medical History:  She,  has a past medical history of CHF (congestive heart failure) ([place]), Chronic back pain, Diabetes mellitus without complication ([place]), Dysrhythmia, Hypothyroidism, Interstitial cystitis, Presence of permanent cardiac pacemaker, and Thyroid disease.   Surgical History:   Past Surgical History:  Procedure Laterality Date   ABDOMINAL HYSTERECTOMY     CHOLECYSTECTOMY     NECK SURGERY     PACEMAKER IMPLANT N/A 08/16/2019   Procedure: PACEMAKER IMPLANT;  Surgeon: Constance Haw, MD;  Location: [facility];  Service: Cardiovascular;  Laterality: N/A;   REVERSE SHOULDER ARTHROPLASTY Right 07/28/2020   Procedure: REVERSE SHOULDER ARTHROPLASTY;  Surgeon: Justice Britain, MD;  Location: [facility];  Service: Orthopedics;  Laterality: Right;   SHOULDER SURGERY     THYROIDECTOMY       Social History:   reports that she has never smoked. She has never used smokeless tobacco. She reports that she does not drink alcohol and does not use drugs.   Family History:  Her family history is negative for Immunodeficiency.   Allergies Allergies  Allergen Reactions   Elmiron [Pentosan Polysulfate] Other (See Comments)    "Hair fell out"   Naloxone Other (See Comments)    Hallucinations/Confusion/Nightmares     Home Medications  Prior to Admission medications   Medication Sig Start Date End Date Taking? Authorizing  Provider  acetaminophen (TYLENOL) 500 MG tablet Take 2 tablets (1,000 mg total) by mouth every 8 (eight) hours as needed. Patient taking differently: Take 1,000 mg by mouth every 8 (eight) hours as needed (for pain- not to exceed 3-4 grams total from all combined sources/24 hours). 01/08/21  Yes Ghimire, Henreitta Leber, MD  allopurinol (ZYLOPRIM) 300 MG tablet Take 300 mg by mouth daily.   Yes [provider]  amitriptyline (ELAVIL) 100 MG tablet Take 100 mg by mouth at bedtime.   Yes [provider]  calcium-vitamin D 250-100 MG-UNIT tablet Take 1 tablet by mouth 2 (two) times daily. 09/14/20  Yes Palumbo, April, MD  ceFAZolin (ANCEF) IVPB Inject 2 g into the vein every 8 (eight) hours. Indication: E.coli epidural abscess/osteo First Dose: Yes Last Day of Therapy:  03/01/21 Labs - Once weekly:  CBC/D and BMP, Labs - Every other week:  ESR and CRP Method of administration: IV Push Method of administration may be changed at the discretion of home infusion pharmacist based upon assessment of the patient and/or caregiver's ability to self-administer the medication ordered. 01/08/21 03/03/21 Yes Ghimire, Henreitta Leber, MD  citalopram (CELEXA) 20 MG tablet Take 20 mg by mouth daily. 02/13/21  Yes [provider]  dapagliflozin propanediol (FARXIGA) 10 MG TABS tablet Take 10 mg by mouth daily.   Yes [provider]  diclofenac Sodium (VOLTAREN) 1 % GEL Apply 2 g topically 2 (two) times daily as needed for pain. 02/13/21  Yes [provider]  FEROSUL 325 (65 Fe) MG tablet Take 325 mg by mouth every morning. 02/19/21  Yes [provider]  folic acid (FOLVITE) 1 MG tablet Take 1 tablet (1 mg total) by mouth daily. 01/23/21  Yes Regalado, Belkys A, MD  furosemide (LASIX) 40 MG tablet Take 1 tablet (40 mg total) by mouth daily. 01/08/21  Yes Ghimire, Henreitta Leber, MD  gabapentin (NEURONTIN) 300 MG capsule Take 1 capsule (300 mg total) by mouth 2 (two) times daily. Patient  taking differently: Take 300 mg by mouth at bedtime. 12/08/20  Yes Nita Sells, MD  glipiZIDE (GLUCOTROL  XL) 10 MG 24 hr tablet Take 10 mg by mouth daily with breakfast.   Yes [provider]  hydrOXYzine (ATARAX) 25 MG tablet Take 25 mg by mouth daily.   Yes [provider]  ipratropium-albuterol (DUONEB) 0.5-2.5 (3) MG/3ML SOLN Take 3 mLs by nebulization every 8 (eight) hours. 01/08/21  Yes Ghimire, Henreitta Leber, MD  levothyroxine (SYNTHROID) 150 MCG tablet Take 150 mcg by mouth daily before breakfast. 09/01/20  Yes [provider]  lisinopril (ZESTRIL) 10 MG tablet Take 10 mg by mouth daily.   Yes [provider]  magnesium oxide (MAG-OX) 400 (240 Mg) MG tablet Take 1 tablet (400 mg total) by mouth 2 (two) times daily. 01/22/21  Yes Regalado, Belkys A, MD  omeprazole (PRILOSEC) 40 MG capsule Take 40 mg by mouth daily.   Yes [provider]  oxybutynin (DITROPAN-XL) 10 MG 24 hr tablet Take 10 mg by mouth daily.   Yes [provider]  oxyCODONE-acetaminophen (PERCOCET) 10-325 MG tablet Take 1 tablet by mouth 4 (four) times daily as needed for pain. 02/15/21  Yes [provider]  polyethylene glycol (MIRALAX / GLYCOLAX) 17 g packet Take 17 g by mouth daily as needed. Patient taking differently: Take 17 g by mouth daily as needed for mild constipation. 01/08/21  Yes Ghimire, Henreitta Leber, MD  potassium chloride (KLOR-CON) 20 MEQ packet Take 40 mEq by mouth daily. 01/23/21  Yes Regalado, Belkys A, MD  pramipexole (MIRAPEX) 1.5 MG tablet Take 1.5 mg by mouth 3 (three) times daily. 06/29/20  Yes [provider]  QUEtiapine (SEROQUEL) 25 MG tablet Take 1 tablet (25 mg total) by mouth at bedtime. 01/08/21  Yes Ghimire, Henreitta Leber, MD  senna-docusate (SENOKOT-S) 8.6-50 MG tablet Take 1 tablet by mouth 2 (two) times daily. Patient taking differently: Take 1 tablet by mouth 2 (two) times daily as needed for mild constipation. 01/22/21  Yes  Regalado, Belkys A, MD  sodium chloride 1 g tablet Take 1 tablet (1 g total) by mouth 2 (two) times daily with a meal. 01/08/21  Yes Ghimire, Henreitta Leber, MD  traZODone (DESYREL) 50 MG tablet Take 1 tablet (50 mg total) by mouth at bedtime as needed for sleep. 01/22/21  Yes Regalado, Belkys A, MD  vitamin B-12 1000 MCG tablet Take 1 tablet (1,000 mcg total) by mouth daily. 01/23/21  Yes Regalado, Belkys A, MD  lidocaine (LIDODERM) 5 % Place 1 patch onto the skin daily. Remove & Discard patch within 12 hours or as directed by MD Patient not taking: Reported on 02/25/2021 01/09/21   Jonetta Osgood, MD  oxyCODONE (OXY IR/ROXICODONE) 5 MG immediate release tablet Take 1-2 tablets (5-10 mg total) by mouth every 6 (six) hours as needed for moderate pain or severe pain. Patient not taking: Reported on 02/25/2021 01/08/21   Jonetta Osgood, MD     Critical care time: 79 minutes    Freda Jackson, MD [facility] Office: [phone]   See Amion for personal pager PCCM on call pager [phone] until 7pm. Please call Elink 7p-7a. [phone]

## 2021-02-27 NOTE — Progress Notes (Signed)
°   02/27/21 1103  Assess: MEWS Score  Temp (!) 101.8 F (38.8 C)  BP 118/67  Pulse Rate (!) 109  Resp 20  Level of Consciousness Alert  SpO2 100 %  O2 Device Nasal Cannula  O2 Flow Rate (L/min) 2 L/min  Assess: MEWS Score  MEWS Temp 2  MEWS Systolic 0  MEWS Pulse 1  MEWS RR 0  MEWS LOC 0  MEWS Score 3  MEWS Score Color Yellow  Assess: if the MEWS score is Yellow or Red  Were vital signs taken at a resting state? Yes  Focused Assessment No change from prior assessment  Does the patient meet 2 or more of the SIRS criteria? Yes  Does the patient have a confirmed or suspected source of infection? Yes  Provider and Rapid Response Notified? No  MEWS guidelines implemented *See Row Information* Yes  Treat  MEWS Interventions Administered prn meds/treatments  Pain Scale 0-10  Pain Score 8  Pain Location Back  Pain Descriptors / Indicators Sharp  Pain Frequency Intermittent  Pain Onset On-going  Take Vital Signs  Increase Vital Sign Frequency  Yellow: Q 2hr X 2 then Q 4hr X 2, if remains yellow, continue Q 4hrs  Escalate  MEWS: Escalate Yellow: discuss with charge nurse/RN and consider discussing with provider and RRT  Notify: Charge Nurse/RN  Name of Charge Nurse/RN Notified Tedd Sias, RN  Date Charge Nurse/RN Notified 02/27/21  Time Charge Nurse/RN Notified 1111  Notify: Provider  Provider Name/Title Ghimire MD  Date Provider Notified 02/27/21  Time Provider Notified [phone]  Notification Type  (Secure chat)  Notification Reason Other (Comment) (Yellow MEWS)  Assess: SIRS CRITERIA  SIRS Temperature  1  SIRS Pulse 1  SIRS Respirations  0  SIRS WBC 0  SIRS Score Sum  2

## 2021-02-27 NOTE — Progress Notes (Addendum)
eLink Physician-Brief Progress Note Patient Name: Jillian Leblanc DOB: [birth year] MRN: [id number]   Date of Service  02/27/2021  HPI/Events of Note  [name] in severe shock which has not responded to 4 pressors so far, BP 76/36, serum albumin < 1.5. Serum lactate 6.5. [name] hypoxemic on most recent ABG.  eICU Interventions  Albumin 5 % 1000 ml iv bolus x 1 ordered, ceiling on Phenylephrine increased to 400 mcg, and Vasopressin increased to 0.04 mcg. [name] followed by ID for antibiotic coverage issues. PEEP increased to 8.        [name] [name] 02/27/2021, 11:22 PM

## 2021-02-27 NOTE — Anesthesia Procedure Notes (Signed)
Procedure Name: Intubation Date/Time: 02/27/2021 6:30 PM Performed by: Gerald Leitz, CRNA Pre-anesthesia Checklist: Patient identified, Patient being monitored, Timeout performed, Emergency Drugs available and Suction available Patient Re-evaluated:Patient Re-evaluated prior to induction Oxygen Delivery Method: Circle system utilized Preoxygenation: Pre-oxygenation with 100% oxygen Induction Type: IV induction Ventilation: Mask ventilation without difficulty Laryngoscope Size: Mac and 3 Grade View: Grade I Tube type: Oral Tube size: 7.0 mm Number of attempts: 1 Airway Equipment and Method: Stylet Placement Confirmation: ETT inserted through vocal cords under direct vision, positive ETCO2 and breath sounds checked- equal and bilateral Secured at: 23 cm Tube secured with: Tape Dental Injury: Teeth and Oropharynx as per pre-operative assessment

## 2021-02-27 NOTE — Progress Notes (Signed)
Patient was found minimally responsive by the nurse and immediately noted to be with Vfib and unresponsive along with large amount of vomitus at bedside. Code was run by ER physician, intubated by anaesthesia at bedside , ROSC in 10 minutes and transferred to ICU. Remains in poor clinical status . Care transferred to Critical care . Will talk to family if they arrive.

## 2021-02-27 NOTE — ED Provider Notes (Signed)
Called to floor CODE BLUE being activated.  Upon arrival, nursing felt patient likely aspirated.  Patient's airway managed by anesthesia.  Patient given ACLS protocol and was able to get ROSC after approximately 10 to 15 minutes.  Patient had good pulse with adequate blood pressure.  Turned over to the hospitalist at approximately 6:44 PM   Lorre Nick, MD 02/27/21 [phone]

## 2021-02-27 NOTE — Plan of Care (Signed)
°  Problem: Coping: °Goal: Level of anxiety will decrease °Outcome: Progressing °  °

## 2021-02-27 NOTE — Procedures (Signed)
Arterial Catheter Insertion Procedure Note  Jillian Leblanc  [id number]  [birth year]  Date:02/27/21  Time:9:04 PM    Provider Performing: Rosann Auerbach    Procedure: Insertion of Arterial Line [id number]) without US guidance  Indication(s) Blood pressure monitoring and/or need for frequent ABGs  Consent Unable to obtain consent due to emergent nature of procedure.  Anesthesia None   Time Out Verified patient identification, verified procedure, site/side was marked, verified correct patient position, special equipment/implants available, medications/allergies/relevant history reviewed, required imaging and test results available.   Sterile Technique  Maximal sterile technique including full sterile barrier drape, hand hygiene, sterile gown, sterile gloves, mask, hair covering, sterile ultrasound probe cover (if used).   Procedure Description Area of catheter insertion was cleaned with chlorhexidine and draped in sterile fashion. Without real-time ultrasound guidance an arterial catheter was placed into the left radial artery.  Appropriate arterial tracings confirmed on monitor.     Complications/Tolerance None; patient tolerated the procedure well.   EBL Minimal   Specimen(s) None

## 2021-02-27 NOTE — Progress Notes (Addendum)
Pharmacy Antibiotic Note  Jillian Leblanc is a 76 y.o. female admitted on 02/27/2021 with pseudomonas bacteremia.  Pharmacy has been consulted for Meropenem dosing.  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  Other recent history includes E. Coli bacteremia, epidural abscess, indwelling foley catheter with recent admission for pseudomonas UTI which was treated with cefepime.  Urine/blood cx + pseudomonas again this admission currently on Cefepime.  Patient s/p Vfib arrest tonight now intubated on pressor therapy.  Tm 101.27F.  Broadening abx coverage to Meropenem.   Plan: Meropenem 1gm IV q8h Monitor renal function and cx data    Height: 5' (152.4 cm) Weight: 78.1 kg (172 lb 2.9 oz) IBW/kg (Calculated) : 45.5  Temp (24hrs), Avg:100.2 F (37.9 C), Min:98.9 F (37.2 C), Max:101.8 F (38.8 C)  Recent Labs  Lab 03/09/2021 1928 02/25/21 0519 02/26/21 0313 02/27/21 2212  WBC 13.4* 12.9* 7.2 PENDING  CREATININE 0.95 0.93 0.80  --   LATICACIDVEN 1.7  --   --   --     Estimated Creatinine Clearance: 57.8 mL/min (by C-G formula based on SCr of 0.8 mg/dL).    Allergies  Allergen Reactions   Elmiron [Pentosan Polysulfate] Other (See Comments)    "Hair fell out"   Naloxone Other (See Comments)    Hallucinations/Confusion/Nightmares    Antimicrobials this admission: One Ancef PTA- was to complete course 2/8 2/1 Cefepime>>2/4 2/4 Meropenem>>  Dose adjustments this admission:  Microbiology results: 2/1 BCx: 1/2 bottles with PsA per BCID - pan-sensitive 2/1 UCx: >100K GNR - PsA - pan-sensitive 2/2 BCx: 1/2 bottles PsA 2/3 repeat BCx: ngtd  Thank you for allowing pharmacy to be a part of this patients care.  Netta Cedars PharmD 02/27/2021 10:45 PM   Addendum:  Scr now increased to 1.36.  Will decrease Meropenem frequency to q12h and continue to monitor renal function.  Netta Cedars, PharmD, BCPS 03-01-2021@2 :17 AM

## 2021-02-27 NOTE — Progress Notes (Signed)
Called to see patient for central line placement.   Refractory shock with unmearsured O2 sat.   On levophed 7mcg/min through IO.   Art line being placed.  Per nurse, bp had recently dropped prior to my arrival (map 40).    Placed groin line (significant neck movement with each breath made neck line impossible without probably a paralytic, also emergent need for line available for immediate use).  Increased levophed, added epinephrine.  Cont vaso.  Had to give push of 144mcg and 64mcg of epi while I was doing procedure.   Increased rr to 30 for compensation.    Bedside echo, very difficult to get good windows.  Heart appears hyperdynamic on Apical 4 chamber view, but not a great image.  Unable to get any other views.  CXR reveals unchanged cardiac silhouette, B infiltrates.    Severe hypoxia, ARDS, aspiration likely.  LTV vent as tolerates.   Refractory shock, 2/2 sepis. Pseudomonas UTI and bacteremia. Also has psoas abscess seen on CT lumbar.  Broaden to mero (received vanc tonight too), bolus 2nd L ns, Bicarb infusion, stress steroids.  Adding phenylephrine and giapreza, unable to get map above 40s consistently even with levo 40 and epi 20 and vaso.    Discussed poor prognosis with daughter.  Rec DNR.  She will discuss with patients husband.

## 2021-02-27 NOTE — Progress Notes (Signed)
Progress Note   Patient: Jillian Leblanc [id number] DOB: 1945-08-01 DOA: 03/15/2021     2 DOS: the patient was seen and examined on 02/27/2021   Brief Leblanc course: Jillian Leblanc is a 76 y.o. female with medical history significant of DM2, HFpEF, PPM.   Pt with admission for osteomyelitis and epidural abscess of spine from Jillian Leblanc 11/25-12/16.  Had decompression and drainage at that time.  Jillian Leblanc bacteremia also at that time.  Pt put on Ancef with plans to treat through Feb.   Pt re-admitted 12/20-12/30 for AMS due to CAUTI (chronic foley use due to urinary retention).  Urine grew out pseudomonas at that time, pt treated with 7 days of cefepime and discharged home on Ancef.   Pt re-admitted 1/4-1/6 with AMS due to hyponatremia from dehydration.  Hyponatremia improved with just normal saline and continuing her home PO salt tabs she had been taking for hyponatremia since prior Leblanc stay.  Apparently she also had episodes of hypoglycemia last time, she continued to take glipizide at home.   Pt sent in to ED at [facility] today by daughter due to increased confusion and hallucination for the past 4 days. She has not gotten out of bed and daughter reports she seems to be acting the way she did the last time she had to be hospitalized.  Daughter also confirmed poor PO intake.   Work up in ED today confirmed mild hyponatremia with sodium 128 today.  Also hypoglycemia that persisted despite D50 and D5 half gtt!   Pt has history of hypoglycemia in past resulting in hospitalization and her being taken off of insulin.  [name] on glipizide 10 mg and Jardiance at home.  She is not on insulin.  Admitted with altered mental status, hypoglycemia and failure to thrive.  Assessment and Plan: Bacteremia due to Pseudomonas Currently on cefepime.  Repeat blood cultures negative so far.  Patient has a pacemaker, TTE is not diagnostic.  Will order TEE.  Lumbar spine CT scan with no significant abscess.   Followed by infectious disease.  Hypoglycemia- (present on admission)    Chronic indwelling Foley catheter Due to urinary retention.  Foley catheter exchanged 2/1.  Could not remove Foley catheter until mobility improves.  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  Currently on cefepime. Challenging to get rid of infection.  Followed by ID.  PICC line removed.  Chronic diastolic CHF (congestive heart failure) (Jillian Leblanc)- (present on admission) Holding lasix since we suspect poor PO intake is responsible for hypoglycemia and hyponatremia.  Currently euvolemic.  Acute metabolic encephalopathy- (present on admission) Most likely due to some combination of hypoglycemia, hyponatremia, and/or UTI (all of which are present). Patient may have some baseline cognitive dysfunction.  Remains confused at times.  Using Haldol as needed.  All-time fall precautions and delirium precautions.  Chronic pain- (present on admission) Continue Oxy PRN for back pain.  We will try to avoid opiates as much possible.  Pressure injury of skin- (present on admission) Pressure Injury 02/25/21 Coccyx Mid;Posterior Stage 2 -  Partial thickness loss of dermis presenting as a shallow open injury with a red, pink wound bed without slough. (Active)  02/25/21 0145  Location: Coccyx  Location Orientation: Mid;Posterior  Staging: Stage 2 -  Partial thickness loss of dermis presenting as a shallow open injury with a red, pink wound bed without slough.  Wound Description (Comments):   Present on Admission: Yes       Diabetes  mellitus type 2 in obese Jillian Leblanc)- (present on admission) Hold all home hypoglycemics in setting of hypoglycemia. Actually: Pt has history of hypoglycemia in past resulting in hospitalization and her being taken off of insulin.  Taking glipizide and Farxiga at home.  Discontinue all hypoglycemics.  Maintaining without dextrose infusion today. Will start on  sensitive sliding scale.    Hyponatremia- (present on admission) Last month when she had this, ended up being due to hypovolemia and it resolved with isotonic saline. Continue salt tablets.  Stable.   Sick sinus syndrome: Has a pacemaker in place.  TEE to check infection of the leads.     Subjective: Patient seen and examined.  Pleasantly confused.  Does not remember this provider.  Had some episodes overnight where she was trying to get out of the bed.  Physical Exam: Vitals:   02/26/21 2025 02/27/21 0544 02/27/21 0647 02/27/21 1103  BP: 107/60 (!) 83/49 124/85 118/67  Pulse: (!) 110 84 78 (!) 109  Resp:  20 19 20   Temp: 98.8 F (37.1 C) 99.2 F (37.3 C)  (!) 101.8 F (38.8 C)  TempSrc: Oral Oral  Oral  SpO2: 100% 100%  100%  Weight:      Height:       General: Sick looking.  Frail and debilitated. Alert and awake but confused.  Oriented x1-2.  She is impulsive and shaky. Cardiovascular: S1-S2 normal.  Regular rate rhythm.  Pacemaker in place. Respiratory: Bilateral clear.  No added sounds. Gastrointestinal: Soft.  Nontender.  Bowel sound present.  Foley catheter with clear urine. Ext: No deformity.  No edema or cyanosis. Musculoskeletal: No palpable tenderness or swelling on the back. Skin: Stage II pressure ulcer on the back.   Data Reviewed:  Results reviewed from today.  Will have more serological test tomorrow morning.  Family Communication: None.  Daughter on the phone 2/3.  Disposition: Status is: Inpatient Remains inpatient appropriate because: Severe systemic illness.  On IV antibiotics.          Planned Discharge Destination:  Unknown at this time.     Time spent: 35 minutes  Author: Barb Merino, MD 02/27/2021 11:06 AM  For on call review [email or web address].

## 2021-02-27 NOTE — Assessment & Plan Note (Signed)
Currently on cefepime.  Repeat blood cultures negative so far.  Patient has a pacemaker, TTE is not diagnostic.  Will order TEE.  Lumbar spine CT scan with no significant abscess.  Followed by infectious disease.

## 2021-02-27 NOTE — Progress Notes (Signed)
eLink Physician-Brief Progress Note Patient Name: Jillian Leblanc DOB: 1945-10-06 MRN: [id number]   Date of Service  02/27/2021  HPI/Events of Note  Patient with a history of recent admission for E. Coli sepsis with osteomyelitis and epidural abscess s/p laminectomy, readmitted with pseudomonas sepsis and bacteremia of urinary tract origin, apparently vomited on the medical floor earlier this evening, followed by aspiration and cardiac arrest, she has 10-15 minutes of ACLS protocol prior to ROSC, and was transferred to the ICU intubated, and mechanically ventilated post-arrest.  eICU Interventions  New Patient Evaluation.        [name] 02/27/2021, 7:30 PM

## 2021-02-27 NOTE — Significant Event (Addendum)
Paged regarding CODE BLUE situation for this patient.  Per nursing staff, patient had telemetry findings of ventricular tachycardia, she was initially awake and alert but subsequently became unresponsive.  ED physician running code, ROSC achieved at time of my arrival.  She was pulseless and CPR was initiated.  She received 3 rounds of epinephrine and shocked for V. fib.  She was intubated and achieved ROSC after approximately 10-15 minutes of ACLS protocol.  Patient had good pulse and stable blood pressure.  Patient subsequently transferred to ICU.  PCCM made aware of patient transfer.

## 2021-02-27 NOTE — Progress Notes (Signed)
Notified daughter Chestine Spore that patient was found unresponsive. CPR started; shock delivered, intubated and medicines given. Patient being transferred to ICU. Daughter on her way to hospital. MD and CN notified I spoke with daughter and she is on her way.MD to call daughter with follow-up and update.

## 2021-02-28 ENCOUNTER — Other Ambulatory Visit (HOSPITAL_COMMUNITY): Payer: Medicare Other

## 2021-02-28 DIAGNOSIS — R6521 Severe sepsis with septic shock: Secondary | ICD-10-CM

## 2021-02-28 LAB — BASIC METABOLIC PANEL
Anion gap: 14 (ref 5–15)
BUN: 17 mg/dL (ref 8–23)
CO2: 17 mmol/L — ABNORMAL LOW (ref 22–32)
Calcium: 6.3 mg/dL — CL (ref 8.9–10.3)
Chloride: 104 mmol/L (ref 98–111)
Creatinine, Ser: 1.41 mg/dL — ABNORMAL HIGH (ref 0.44–1.00)
GFR, Estimated: 39 mL/min — ABNORMAL LOW (ref >60)
Glucose, Bld: 274 mg/dL — ABNORMAL HIGH (ref 70–99)
Potassium: 3.4 mmol/L — ABNORMAL LOW (ref 3.5–5.1)
Sodium: 135 mmol/L (ref 135–145)

## 2021-02-28 LAB — CBC WITH DIFFERENTIAL/PLATELET
Abs Immature Granulocytes: 0 10*3/uL (ref 0.00–0.07)
Basophils Absolute: 0 10*3/uL (ref 0.0–0.1)
Basophils Relative: 1 %
Eosinophils Absolute: 0 10*3/uL (ref 0.0–0.5)
Eosinophils Relative: 0 %
HCT: 26.6 % — ABNORMAL LOW (ref 36.0–46.0)
Hemoglobin: 8.1 g/dL — ABNORMAL LOW (ref 12.0–15.0)
Immature Granulocytes: 0 %
Lymphocytes Relative: 51 %
Lymphs Abs: 0.5 10*3/uL — ABNORMAL LOW (ref 0.7–4.0)
MCH: 30.6 pg (ref 26.0–34.0)
MCHC: 30.5 g/dL (ref 30.0–36.0)
MCV: 100.4 fL — ABNORMAL HIGH (ref 80.0–100.0)
Monocytes Absolute: 0.1 10*3/uL (ref 0.1–1.0)
Monocytes Relative: 5 %
Neutro Abs: 0.4 10*3/uL — CL (ref 1.7–7.7)
Neutrophils Relative %: 43 %
Platelets: 188 10*3/uL (ref 150–400)
RBC: 2.65 MIL/uL — ABNORMAL LOW (ref 3.87–5.11)
RDW: 17.4 % — ABNORMAL HIGH (ref 11.5–15.5)
WBC: 1 10*3/uL — CL (ref 4.0–10.5)
nRBC: 0 % (ref 0.0–0.2)

## 2021-02-28 LAB — BLOOD GAS, ARTERIAL
Acid-base deficit: 12.1 mmol/L — ABNORMAL HIGH (ref 0.0–2.0)
Acid-base deficit: 13.2 mmol/L — ABNORMAL HIGH (ref 0.0–2.0)
Bicarbonate: 16.7 mmol/L — ABNORMAL LOW (ref 20.0–28.0)
Bicarbonate: 16 mmol/L — ABNORMAL LOW (ref 20.0–28.0)
FIO2: 100
O2 Saturation: 55.9 %
O2 Saturation: 54.4 %
Patient temperature: 99.1
Patient temperature: 99.2
pCO2 arterial: 55.9 mmHg — ABNORMAL HIGH (ref 32.0–48.0)
pCO2 arterial: 59.4 mmHg — ABNORMAL HIGH (ref 32.0–48.0)
pH, Arterial: 7.104 — CL (ref 7.350–7.450)
pH, Arterial: 7.061 — CL (ref 7.350–7.450)
pO2, Arterial: 39.5 mmHg — CL (ref 83.0–108.0)
pO2, Arterial: 41.9 mmHg — ABNORMAL LOW (ref 83.0–108.0)

## 2021-02-28 LAB — GLUCOSE, CAPILLARY: Glucose-Capillary: 270 mg/dL — ABNORMAL HIGH (ref 70–99)

## 2021-02-28 LAB — TROPONIN I (HIGH SENSITIVITY)
Troponin I (High Sensitivity): 66 ng/L — ABNORMAL HIGH (ref <18)
Troponin I (High Sensitivity): 69 ng/L — ABNORMAL HIGH (ref <18)

## 2021-02-28 LAB — MAGNESIUM: Magnesium: 1.6 mg/dL — ABNORMAL LOW (ref 1.7–2.4)

## 2021-02-28 LAB — LACTIC ACID, PLASMA: Lactic Acid, Venous: 7.5 mmol/L (ref 0.5–1.9)

## 2021-02-28 LAB — PHOSPHORUS: Phosphorus: 3.7 mg/dL (ref 2.5–4.6)

## 2021-02-28 LAB — CORTISOL: Cortisol, Plasma: >100 ug/dL

## 2021-02-28 LAB — MRSA NEXT GEN BY PCR, NASAL: MRSA by PCR Next Gen: DETECTED — AB

## 2021-02-28 MED ORDER — ALBUMIN HUMAN 25 % IV SOLN
50.0000 g | Freq: Once | INTRAVENOUS | Status: AC
  Administered 2021-02-28: 50 g via INTRAVENOUS
  Filled 2021-02-28: qty 200

## 2021-02-28 MED ORDER — EPINEPHRINE 1 MG/10ML IJ SOSY
PREFILLED_SYRINGE | INTRAMUSCULAR | Status: AC
  Filled 2021-02-28: qty 20

## 2021-02-28 MED ORDER — SODIUM BICARBONATE 8.4 % IV SOLN
100.0000 meq | Freq: Once | INTRAVENOUS | Status: AC
  Administered 2021-02-28: 100 meq via INTRAVENOUS
  Filled 2021-02-28: qty 50

## 2021-02-28 MED ORDER — SODIUM BICARBONATE 8.4 % IV SOLN
50.0000 meq | Freq: Once | INTRAVENOUS | Status: AC
Start: 2021-02-28 — End: 2021-02-27
  Administered 2021-02-27: 50 meq via INTRAVENOUS

## 2021-02-28 MED ORDER — CALCIUM GLUCONATE-NACL 2-0.675 GM/100ML-% IV SOLN
2.0000 g | Freq: Once | INTRAVENOUS | Status: AC
  Administered 2021-02-28: 2000 mg via INTRAVENOUS
  Filled 2021-02-28: qty 100

## 2021-02-28 MED ORDER — PHENYLEPHRINE CONCENTRATED 100MG/250ML (0.4 MG/ML) INFUSION SIMPLE
0.0000 ug/min | INTRAVENOUS | Status: DC
  Administered 2021-02-28: 400 ug/min via INTRAVENOUS
  Administered 2021-02-28: 290 ug/min via INTRAVENOUS
  Filled 2021-02-28 (×3): qty 250

## 2021-02-28 MED ORDER — SODIUM CHLORIDE 0.9 % IV SOLN
1.0000 g | Freq: Two times a day (BID) | INTRAVENOUS | Status: DC
  Filled 2021-02-28: qty 1

## 2021-02-28 MED ORDER — SODIUM CHLORIDE 0.9% FLUSH: 10.0000 mL | INTRAVENOUS | Status: DC | PRN

## 2021-02-28 MED ORDER — SODIUM BICARBONATE 8.4 % IV SOLN
50.0000 meq | Freq: Once | INTRAVENOUS | Status: AC
Start: 2021-02-28 — End: 2021-02-27

## 2021-02-28 MED ORDER — ALBUMIN HUMAN 5 % IV SOLN
25.0000 g | Freq: Once | INTRAVENOUS | Status: AC
  Administered 2021-02-28: 25 g via INTRAVENOUS
  Filled 2021-02-28: qty 500

## 2021-03-01 LAB — CALCIUM, IONIZED: Calcium, Ionized, Serum: 3.6 mg/dL — ABNORMAL LOW (ref 4.5–5.6)

## 2021-03-03 LAB — CULTURE, BLOOD (ROUTINE X 2)
Culture: NO GROWTH
Culture: NO GROWTH
Special Requests: ADEQUATE
Special Requests: ADEQUATE

## 2021-03-08 LAB — SULFONYLUREA HYPOGLYCEMICS PANEL, SERUM
Acetohexamide: NEGATIVE ug/mL (ref 20–60)
Chlorpropamide: NEGATIVE ug/mL (ref 75–250)
Glimepiride: NEGATIVE ng/mL (ref 80–250)
Glipizide: 35 ng/mL — ABNORMAL LOW (ref 200–1000)
Glyburide: NEGATIVE ng/mL
Nateglinide: NEGATIVE ng/mL
Repaglinide: NEGATIVE ng/mL
Tolazamide: NEGATIVE ug/mL
Tolbutamide: NEGATIVE ug/mL (ref 40–100)

## 2021-03-24 NOTE — Progress Notes (Signed)
eLink Physician-Brief Progress Note Patient Name: Jillian Leblanc DOB: 06-28-1945 MRN: [id number]   Date of Service  2021/03/08  HPI/Events of Note  Patient had a good initial blood pressure response to albumin but MAP's are back down to the 50's, saturation is also low despite increase in PEEP, further increase in PEEP will require an improvement in hemodynamic status.  eICU Interventions  Stat ABG ordered to include ionized calcium, Albumin 25 % 50 gm iv bolus ordered. Will increase PEEP to the 12 range if BP improves with albumin, and ABG confirms suspected continuing profound hypoxemia. Will replete calcium if low. Overall prognosis remains abysmal.        [name] Jacobie Stamey 2021-03-08, 2:37 AM

## 2021-03-24 NOTE — Progress Notes (Signed)
eLink Physician-Brief Progress Note Patient Name: Jillian Leblanc DOB: 12-Feb-1945 MRN: [id number]   Date of Service  03/08/2021  HPI/Events of Note  Profound metabolic acidosis with PH of 7.1 despite Bicarb gtt, Ca++ 1.07.  eICU Interventions  Sodium bicarbonate 100 meq iv bolus x 1 ordered, Calcium gluconate 2 gm iv bolus ordered.     Intervention Category Intermediate Interventions: Communication with other healthcare providers and/or family  Migdalia Dk 02/27/2021, 4:03 AM

## 2021-03-24 NOTE — Progress Notes (Signed)
eLink Physician-Brief Progress Note Patient Name: Jillian Leblanc DOB: 1945/07/20 MRN: [id number]   Date of Service  03/13/21  HPI/Events of Note  Patient has reached the limits of  care, she is maxed out on 5 pressors including Giapreza, aggressive resuscitation has failed to reverse her shock, and her profound respiratory failure cannot be reversed by  increasing her PEEP, due to her  hemodynamic instability.  eICU Interventions  I called the daughter and informed her that her mother's death is imminent because we have reached the limits of care, I explained the futility of cardiopulmonary  resuscitation in this setting, she will discuss with her father regarding DNR status, I also recommended to her that family members who desire to be at bedside at the time of death need to come to the hospital as soon as possible.        Kerry Kass Adoria Kawamoto 03-13-2021, 3:52 AM

## 2021-03-24 NOTE — Progress Notes (Signed)
°   03-10-2021 0610  Clinical Encounter Type  Visited With Patient and family together  Visit Type Initial;Spiritual support;Patient actively dying  Referral From Nurse  Consult/Referral To [place] responded to page. Upon entering unit, the attending nurse Abie provided update. The patient's daughter, Jillian Leblanc was at bedside. [name] sat with her and actively listened as she emotional spoke her mother. [name] read from the sacred text, provided compassionate touch during prayer. This note was prepared by Jeanine Luz, M.Div..  For questions please contact by phone [phone].

## 2021-03-24 NOTE — Progress Notes (Signed)
°   2021/03/25 0700  Clinical Encounter Type  Visited With Patient and family together  Visit Type Follow-up;Spiritual support;Death  Referral From Nurse  Consult/Referral To Chaplain   Chaplain Jorene Guest responded to the request to be at bedside as the patient transition. The patient's daughter returned and Chaplain provided emotional grief support. Chaplain offered prayer of comfort. Chaplain engaged in active listening as Abigail Butts spoke of her mother's love and service to the community. 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 Abigail Butts grieved appropriately as she spoke of how her mother's passing will affect her father as he is in poor health. [name] gave her the patient placement card. She will contact [facility] in [place]. Funeral home information given to the attending nurse, Salomon Fick. This note was prepared by Jeanine Luz, M.Div..  For questions please contact by phone [phone].

## 2021-03-24 NOTE — Progress Notes (Signed)
Critical labs called to [facility].  Lactic acid 7.5 pH 7.1 pO2- 39.5

## 2021-03-24 NOTE — Progress Notes (Addendum)
Critical pH 7.061 called in to [facility]

## 2021-03-24 NOTE — Progress Notes (Signed)
eLink Physician-Brief Progress Note Patient Name: Jillian Leblanc DOB: 22-Nov-1945 MRN: [id number]   Date of Service  03/14/2021  HPI/Events of Note  PH 7.06, patient has received 6 amps of Bicarb already tonight , and has a Bicarb gtt infusing at 150 ml / hour.  eICU Interventions  Will not give further Bicarb pushes. Patient made DNR at the request of her daughter Ronnette Juniper and Palliative Care consulted.        [name] Daman Steffenhagen 02/27/2021, 5:27 AM

## 2021-03-24 NOTE — Progress Notes (Addendum)
Critical WBC 0.9 and lactic acid 6.5 called in to [facility].

## 2021-03-24 NOTE — Progress Notes (Signed)
eLink Physician-Brief Progress Note Patient Name: Jillian Leblanc DOB: 1945-06-25 MRN: [id number]   Date of Service  03/11/2021  HPI/Events of Note  Patient has oral synthroid scheduled to be given this morning but cannot get it due to   lack of enteric  access.                                                                                                      eICU Interventions  Synthroid held for now.        [name] Sarajean Dessert Mar 11, 2021, 5:52 AM

## 2021-03-24 NOTE — Death Summary Note (Addendum)
DEATH SUMMARY   Patient Details  Name: Jillian Leblanc MRN: [id number] DOB: 28-Sep-1945 BSJ:[id number], Jillian Baker, MD  Admission/Discharge Information   Admit Date:  03-21-2021  Date of Death: Date of Death: 2021/03/25  Time of Death: Time of Death: 0705  Length of Stay: 3   Principle Cause of death: Septic shock due to bacteremia from Pseudomonas due to UTI present on admission with indwelling Foley catheter in place.  Hospital Diagnoses: Principal Problem:   Sepsis due to Pseudomonas [facility]) Active Problems:   Hyponatremia   Diabetes mellitus type 2 in obese (HCC)   Pressure injury of skin   Chronic pain   Acute metabolic encephalopathy   Chronic diastolic CHF (congestive heart failure) ([place])   E coli bacteremia   Discitis of lumbar region   Chronic indwelling Foley catheter   Hypoglycemia   Encephalopathy   Bacteremia due to [place] Hospital Course: Jillian Leblanc is a 76 y.o. female with medical history significant of DM2, HFpEF, PPM.   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  Had decompression and drainage at that time.  E.coli bacteremia also at that time.  Pt put on Ancef with plans to treat through 2022-04-01.   Pt re-admitted 12/20-12/30 for AMS due to CAUTI (chronic foley use due to urinary retention).  Urine grew out pseudomonas at that time, pt treated with 7 days of cefepime and discharged home on Ancef.   Pt re-admitted 1/4-1/6 with AMS due to hyponatremia from dehydration.  Hyponatremia improved with just normal saline and continuing her home PO salt tabs she had been taking for hyponatremia since prior hospital stay.  Apparently she also had episodes of hypoglycemia last time, she continued to take glipizide at home.  Patient was readmitted to the hospital with confusion and hallucination, poor appetite and hypoglycemic episodes.  She was found with hypoglycemia secondary to sulfonylurea, Pseudomonas bacteremia,  Pseudomonas UTI with indwelling Foley catheter.  Patient was having poor appetite, agitations, hallucinations and discomfort.  She was getting treated in the hospital for Pseudomonas bacteremia with cefepime, work-up was in progress to look for any metastatic infection including infection of the pacemaker lead or vertebrae.  2/4, evening patient was found lethargic, subsequently became unresponsive and had cardiac arrest, initially V.  Fib, CPR and cardioversion with ROSC in 10 minutes.  She was intubated and transferred to intensive care unit with mechanical ventilation.  Subsequently, patient developed hypotension and she was started on vasopressor support.  Overnight, patient continued to became acidotic, severely septic, severe neutropenia and not responding to vasopressor support.  With her poor clinical condition and ongoing poor health, decision was made not to do further CPR but continue to treat her aggressively with vasopressor support.  While on active treatment in intensive care unit, patient died with her daughter at the bedside.   Immediate cause of death: Septic shock Secondary to Pseudomonas bacteremia Due to Pseudomonas UTI  Other causes present: Failure to thrive Sick sinus syndrome status post pacemaker Type 2 diabetes Metabolic encephalopathy   Addendum made for Diagnosis clarification for coding purpose only: UTI due to foley catheter      Procedures: Mechanical ventilation  Consultations: Infectious disease, critical care  The results of significant diagnostics from this hospitalization (including imaging, microbiology, ancillary and laboratory) are listed below for reference.   Significant Diagnostic Studies: DG Abd 1 View  Result Date: 02/27/2021 CLINICAL DATA:  OG and endotracheal tube placements EXAM: ABDOMEN - 1 VIEW  COMPARISON:  Chest 02/27/2021 FINDINGS: Enteric tube is coiled in the left upper quadrant consistent with location in the upper stomach. Surgical  clips in the right upper quadrant. Postoperative fixation of the lower lumbosacral spine. Spinal stimulator with generator pack in the left lower quadrant in lead tip in the sciatic region. Gaseous distention of small bowel and colon may indicate ileus. IMPRESSION: Enteric tube tip appears to be coiled in the upper stomach. Gaseous distention of bowel may indicate ileus. Electronically Signed   By: Lucienne Capers M.D.   On: 02/27/2021 22:20   CT Head Wo Contrast  Result Date: 03/15/2021 CLINICAL DATA:  Altered mental status EXAM: CT HEAD WITHOUT CONTRAST TECHNIQUE: Contiguous axial images were obtained from the base of the skull through the vertex without intravenous contrast. RADIATION DOSE REDUCTION: This exam was performed according to the departmental dose-optimization program which includes automated exposure control, adjustment of the mA and/or kV according to patient size and/or use of iterative reconstruction technique. COMPARISON:  CT head 01/27/2021 FINDINGS: Brain: No acute intracranial hemorrhage, mass effect, or herniation. No extra-axial fluid collections. No evidence of acute territorial infarct. No hydrocephalus. Mild cortical volume loss. Mild chronic microvascular ischemic changes. Vascular: No hyperdense vessel or unexpected calcification. Skull: Normal. Negative for fracture or focal lesion. Sinuses/Orbits: No acute finding. Other: None. IMPRESSION: Chronic changes with no acute intracranial process identified. Electronically Signed   By: Ofilia Neas M.D.   On: 02/27/2021 21:00   CT LUMBAR SPINE W CONTRAST  Result Date: 02/26/2021 CLINICAL DATA:  Epidural abscess. Sepsis. Pseudomonas infection. E coli infection. Spinal decompression in December. EXAM: CT LUMBAR SPINE WITH CONTRAST TECHNIQUE: Multidetector CT imaging of the lumbar spine was performed with intravenous contrast administration. RADIATION DOSE REDUCTION: This exam was performed according to the departmental  dose-optimization program which includes automated exposure control, adjustment of the mA and/or kV according to patient size and/or use of iterative reconstruction technique. CONTRAST:  124m OMNIPAQUE IOHEXOL 300 MG/ML  SOLN COMPARISON:  Radiography 01/04/2021.  CT 01/04/2021.  CT 12/19/2020. FINDINGS: Segmentation: 5 lumbar type vertebral bodies as numbered previously. Alignment: No change in alignment. Vertebrae: Slight further lytic destruction in the region of the pathologic fracture of the L5 vertebral body related to osteomyelitis. Slight further loss of height by a few mm. Continued posterior bowing of the posterior margin of the vertebral body by about 4 mm. Mild lytic and sclerotic change at the superior endplate of L5 is stable without evidence of collapse in that location. Interval posterior fusion from L3 to the sacrum with sacroiliac extensions. There is evidence of screw motion with surrounding lucency at L3 on both sides. L4 screws are well position without evidence of motion. At L5, right pedicle screw traverses the facet joint and passes medial and superior to the pedicle, crossing the lateral recess of the spinal canal and entering the L4-5 disc. On the left, pedicle screws slightly lateral to the pedicle with the tip in the disc. The left S1 screw is well position. The right S1 screw traverses the lateral S1 foramen. Chronic compression deformity at T12 is unchanged. Paraspinal and other soft tissues: There is now a discretely marginated and somewhat peripherally calcified fluid density collection within the left ileo psoas muscle extending over a length of 4.5 cm with a maximal transverse diameter of 2.5 cm consistent with inflammatory collection. Disc levels: No significant disc level pathology at L2-3 or above. L3-4: Bulging of the disc. Mild facet ligamentous hypertrophy. Mild narrowing of both lateral recesses.  Limited detail because of hardware artifact. L4-5: Bulging of the disc. Facet  and ligamentous hypertrophy. Moderate multifactorial stenosis. Limited detail because of hardware artifact. L5-S1: Interval posterior decompression. Limited detail the disc level because of hardware artifact. I cannot clearly identify definite epidural collection, but detail is quite limited because of the artifact. IMPRESSION: Since the last CT of 01/04/2021, the patient has had fusion procedure from L3 to the sacrum with sacroiliac extensions and posterior decompression at L4-5 and L5-S1. There is slight further lytic destruction and collapse of the L5 vertebral body. Changes at the superior endplate of S1 appear essentially stable. Because of hardware artifact, it is difficult to accurately evaluate the spinal canal. I do not see a definite epidural abscess. New demonstration of a well-circumscribed and partially peripherally calcified collection in the left ileo psoas muscle measuring approximately 4.5 x 2.5 x 2.0 cm consistent with inflammatory collection. Evidence of screw motion at the L3 level with surrounding lucency. Right L5 screw medial and superior to the pedicle, terminating in the disc space. Right S1 screw traverses the lateral S1 foramen. Left L5 screw lateral to the pedicle with the tip in the disc. Electronically Signed   By: Nelson Chimes M.D.   On: 02/26/2021 15:17   DG CHEST PORT 1 VIEW  Result Date: 02/27/2021 CLINICAL DATA:  ET tube placement, OG tube placement EXAM: PORTABLE CHEST 1 VIEW COMPARISON:  03/17/2021 FINDINGS: Endotracheal tube is 13 mm above the carina. OG tube is in the stomach. Left pacer is unchanged. Bilateral airspace disease, most compatible with edema. This is new since prior study. Heart is borderline in size. No effusions or acute bony abnormality. IMPRESSION: Endotracheal tube 15 mm above the carina.  OG tube in the stomach. Bilateral airspace disease, likely edema. Electronically Signed   By: Rolm Baptise M.D.   On: 02/27/2021 22:08   DG Chest Port 1 View  Result  Date: 02/26/2021 CLINICAL DATA:  Dehydration, fell 2 weeks ago, confusion EXAM: PORTABLE CHEST 1 VIEW COMPARISON:  01/27/2021 FINDINGS: Single frontal view of the chest demonstrates stable dual lead pacer and right-sided PICC. Cardiac silhouette is unremarkable. No airspace disease, effusion, or pneumothorax. No acute bony abnormality. IMPRESSION: 1. No acute intrathoracic process. Electronically Signed   By: Randa Ngo M.D.   On: 03/07/2021 21:14   CUP PACEART REMOTE DEVICE CHECK  Result Date: 02/15/2021 Scheduled remote reviewed. Normal device function.  6 NSVT, 5 fast AV, all EGM's show 1:1, longest episode 54mn Next remote 91 days. LA  ECHOCARDIOGRAM LIMITED  Result Date: 02/26/2021    ECHOCARDIOGRAM LIMITED REPORT   Patient Name:   GSUSI GOSLINDate of Exam: 02/26/2021 Medical Rec #:  [id number]     Height:       60.0 in Accession #:    [id number]    Weight:       172.2 lb Date of Birth:  5October 27, 1947     BSA:          1.751 m Patient Age:    89years       BP:           96/80 mmHg Patient Gender: F              HR:           62 bpm. Exam Location:  Inpatient Procedure: 2D Echo Indications:    Bacteremia  History:        Patient has prior history of Echocardiogram examinations, most  recent 12/21/2020. CHF; Risk Factors:Diabetes.  Sonographer:    Jefferey Pica Referring Phys: [id number] [place]  1. Doubt endocarditis but blooming from AV sclerosis and MAC as well as artifact from pacing leads in RA/RV make it difficult to r/o If patient has persistent bacteremia can consider TEE.  2. Abnormal septal motion . Left ventricular ejection fraction, by estimation, is 55%. The left ventricle has normal function. The left ventricle has no regional wall motion abnormalities.  3. Catheter in RV/RA . Right ventricular systolic function is normal. The right ventricular size is normal.  4. Left atrial size was mildly dilated.  5. The mitral valve is abnormal. No evidence of  mitral valve regurgitation. No evidence of mitral stenosis. Moderate mitral annular calcification.  6. Tricuspid valve regurgitation is moderate.  7. The aortic valve is tricuspid. There is moderate calcification of the aortic valve. There is moderate thickening of the aortic valve. Aortic valve regurgitation is not visualized. Aortic valve sclerosis/calcification is present, without any evidence of aortic stenosis.  8. The inferior vena cava is normal in size with greater than 50% respiratory variability, suggesting right atrial pressure of 3 mmHg. FINDINGS  Left Ventricle: Abnormal septal motion. Left ventricular ejection fraction, by estimation, is 55%. The left ventricle has normal function. The left ventricle has no regional wall motion abnormalities. The left ventricular internal cavity size was normal  in size. There is no left ventricular hypertrophy. Right Ventricle: Catheter in RV/RA. The right ventricular size is normal. No increase in right ventricular wall thickness. Right ventricular systolic function is normal. Left Atrium: Left atrial size was mildly dilated. Right Atrium: Right atrial size was normal in size. Pericardium: There is no evidence of pericardial effusion. Mitral Valve: The mitral valve is abnormal. There is mild thickening of the mitral valve leaflet(s). There is mild calcification of the mitral valve leaflet(s). Moderate mitral annular calcification. No evidence of mitral valve stenosis. Tricuspid Valve: The tricuspid valve is normal in structure. Tricuspid valve regurgitation is moderate . No evidence of tricuspid stenosis. Aortic Valve: The aortic valve is tricuspid. There is moderate calcification of the aortic valve. There is moderate thickening of the aortic valve. Aortic valve regurgitation is not visualized. Aortic valve sclerosis/calcification is present, without any  evidence of aortic stenosis. Pulmonic Valve: The pulmonic valve was normal in structure. Pulmonic valve  regurgitation is not visualized. No evidence of pulmonic stenosis. Aorta: The aortic root is normal in size and structure. Venous: The inferior vena cava is normal in size with greater than 50% respiratory variability, suggesting right atrial pressure of 3 mmHg. IAS/Shunts: No atrial level shunt detected by color flow Doppler. Additional Comments: Doubt endocarditis but blooming from AV sclerosis and MAC as well as artifact from pacing leads in RA/RV make it difficult to r/o If patient has persistent bacteremia can consider TEE. LEFT VENTRICLE PLAX 2D LVIDd:         4.70 cm LVIDs:         3.40 cm LV PW:         0.90 cm LV IVS:        0.70 cm LVOT diam:     2.00 cm LVOT Area:     3.14 cm  LV Volumes (MOD) LV vol d, MOD A4C: 76.0 ml LV vol s, MOD A4C: 40.7 ml LV SV MOD A4C:     76.0 ml LEFT ATRIUM             Index  RIGHT ATRIUM           Index LA diam:        4.30 cm 2.46 cm/m   RA Area:     14.30 cm LA Vol (A2C):   50.9 ml 29.06 ml/m  RA Volume:   39.20 ml  22.38 ml/m LA Vol (A4C):   53.9 ml 30.77 ml/m LA Biplane Vol: 57.7 ml 32.94 ml/m   AORTA Ao Root diam: 3.30 cm Ao Asc diam:  3.60 cm MITRAL VALVE MV Area (PHT): 2.45 cm     SHUNTS MV Decel Time: 310 msec     Systemic Diam: 2.00 cm MV E velocity: 103.00 cm/s Jenkins Rouge MD Electronically signed by Jenkins Rouge MD Signature Date/Time: 02/26/2021/4:07:23 PM    Final     Microbiology: Recent Results (from the past 240 hour(s))  Urine Culture     Status: Abnormal   Collection Time: 03/12/2021  7:43 PM   Specimen: Urine, Catheterized  Result Value Ref Range Status   Specimen Description   Final    URINE, CATHETERIZED Performed at [facility], [address] [place], [place] [address]    Special Requests   Final    NONE Performed at [facility], [address] [place], [place] [address]    Culture >=100,000 COLONIES/mL PSEUDOMONAS AERUGINOSA (A)  Final   Report Status 02/27/2021 FINAL  Final   Organism ID, Bacteria  PSEUDOMONAS AERUGINOSA (A)  Final      Susceptibility   Pseudomonas aeruginosa - MIC*    CEFTAZIDIME 4 SENSITIVE Sensitive     CIPROFLOXACIN <=0.25 SENSITIVE Sensitive     GENTAMICIN <=1 SENSITIVE Sensitive     IMIPENEM 2 SENSITIVE Sensitive     PIP/TAZO <=4 SENSITIVE Sensitive     CEFEPIME 2 SENSITIVE Sensitive     * >=100,000 COLONIES/mL PSEUDOMONAS AERUGINOSA  Resp Panel by RT-PCR (Flu A&B, Covid) Nasopharyngeal Swab     Status: None   Collection Time: 03/15/2021  8:35 PM   Specimen: Nasopharyngeal Swab; Nasopharyngeal(NP) swabs in vial transport medium  Result Value Ref Range Status   SARS Coronavirus 2 by RT PCR NEGATIVE NEGATIVE Final    Comment: (NOTE) SARS-CoV-2 target nucleic acids are NOT DETECTED.  The SARS-CoV-2 RNA is generally detectable in upper respiratory specimens during the acute phase of infection. The lowest concentration of SARS-CoV-2 viral copies this assay can detect is 138 copies/mL. A negative result does not preclude SARS-Cov-2 infection and should not be used as the sole basis for treatment or other patient management decisions. A negative result may occur with  improper specimen collection/handling, submission of specimen other than nasopharyngeal swab, presence of viral mutation(s) within the areas targeted by this assay, and inadequate number of viral copies(<138 copies/mL). A negative result must be combined with clinical observations, patient history, and epidemiological information. The expected result is Negative.  Fact Sheet for Patients:  EntrepreneurPulse.com.au  Fact Sheet for Healthcare Providers:  IncredibleEmployment.be  This test is no t yet approved or cleared by the [place] FDA and  has been authorized for detection and/or diagnosis of SARS-CoV-2 by FDA under an Emergency Use Authorization (EUA). This EUA will remain  in effect (meaning this test can be used) for the duration of the COVID-19  declaration under Section 564(b)(1) of the Act, 21 U.S.C.section 360bbb-3(b)(1), unless the authorization is terminated  or revoked sooner.       Influenza A by PCR NEGATIVE NEGATIVE Final   Influenza B  by PCR NEGATIVE NEGATIVE Final    Comment: (NOTE) The Xpert Xpress SARS-CoV-2/FLU/RSV plus assay is intended as an aid in the diagnosis of influenza from Nasopharyngeal swab specimens and should not be used as a sole basis for treatment. Nasal washings and aspirates are unacceptable for Xpert Xpress SARS-CoV-2/FLU/RSV testing.  Fact Sheet for Patients: EntrepreneurPulse.com.au  Fact Sheet for Healthcare Providers: IncredibleEmployment.be  This test is not yet approved or cleared by the [place] FDA and has been authorized for detection and/or diagnosis of SARS-CoV-2 by FDA under an Emergency Use Authorization (EUA). This EUA will remain in effect (meaning this test can be used) for the duration of the COVID-19 declaration under Section 564(b)(1) of the Act, 21 U.S.C. section 360bbb-3(b)(1), unless the authorization is terminated or revoked.  Performed at [facility], [address] [place], [place] [address]   Blood culture (routine x 2)     Status: Abnormal   Collection Time: 03/22/2021  9:35 PM   Specimen: BLOOD  Result Value Ref Range Status   Specimen Description   Final    BLOOD LEFT ANTECUBITAL Performed at [facility], [address] [place], [place] [address]    Special Requests   Final    BOTTLES DRAWN AEROBIC AND ANAEROBIC Blood Culture adequate volume Performed at [facility], [address] [place], [place] [address]    Culture  Setup Time   Final    GRAM NEGATIVE RODS AEROBIC BOTTLE ONLY RESULT CALLED TO, READ BACK BY AND VERIFIED WITH: Winfred Leeds [id number] _0  FH Performed at [facility], [place] [address], [place], [place] [address]    Culture PSEUDOMONAS  AERUGINOSA (A)  Final   Report Status 02/27/2021 FINAL  Final   Organism ID, Bacteria PSEUDOMONAS AERUGINOSA  Final      Susceptibility   Pseudomonas aeruginosa - MIC*    CEFTAZIDIME 4 SENSITIVE Sensitive     CIPROFLOXACIN <=0.25 SENSITIVE Sensitive     GENTAMICIN <=1 SENSITIVE Sensitive     IMIPENEM 2 SENSITIVE Sensitive     PIP/TAZO <=4 SENSITIVE Sensitive     CEFEPIME 2 SENSITIVE Sensitive     * PSEUDOMONAS AERUGINOSA  Blood Culture ID Panel (Reflexed)     Status: Abnormal   Collection Time: 03/14/2021  9:35 PM  Result Value Ref Range Status   Enterococcus faecalis NOT DETECTED NOT DETECTED Final   Enterococcus Faecium NOT DETECTED NOT DETECTED Final   Listeria monocytogenes NOT DETECTED NOT DETECTED Final   Staphylococcus species NOT DETECTED NOT DETECTED Final   Staphylococcus aureus (BCID) NOT DETECTED NOT DETECTED Final   Staphylococcus epidermidis NOT DETECTED NOT DETECTED Final   Staphylococcus lugdunensis NOT DETECTED NOT DETECTED Final   Streptococcus species NOT DETECTED NOT DETECTED Final   Streptococcus agalactiae NOT DETECTED NOT DETECTED Final   Streptococcus pneumoniae NOT DETECTED NOT DETECTED Final   Streptococcus pyogenes NOT DETECTED NOT DETECTED Final   A.calcoaceticus-baumannii NOT DETECTED NOT DETECTED Final   Bacteroides fragilis NOT DETECTED NOT DETECTED Final   Enterobacterales NOT DETECTED NOT DETECTED Final   Enterobacter cloacae complex NOT DETECTED NOT DETECTED Final   Escherichia coli NOT DETECTED NOT DETECTED Final   Klebsiella aerogenes NOT DETECTED NOT DETECTED Final   Klebsiella oxytoca NOT DETECTED NOT DETECTED Final   Klebsiella pneumoniae NOT DETECTED NOT DETECTED Final   Proteus species NOT DETECTED NOT DETECTED Final   Salmonella species NOT DETECTED NOT DETECTED Final   Serratia marcescens NOT  DETECTED NOT DETECTED Final   Haemophilus influenzae NOT DETECTED NOT DETECTED Final   Neisseria meningitidis NOT DETECTED NOT DETECTED Final    Pseudomonas aeruginosa DETECTED (A) NOT DETECTED Final    Comment: RESULT CALLED TO, READ BACK BY AND VERIFIED WITH: PHARMD EMaricela Bo [id number] _[id number]  FH    Stenotrophomonas maltophilia NOT DETECTED NOT DETECTED Final   Candida albicans NOT DETECTED NOT DETECTED Final   Candida auris NOT DETECTED NOT DETECTED Final   Candida glabrata NOT DETECTED NOT DETECTED Final   Candida krusei NOT DETECTED NOT DETECTED Final   Candida parapsilosis NOT DETECTED NOT DETECTED Final   Candida tropicalis NOT DETECTED NOT DETECTED Final   Cryptococcus neoformans/gattii NOT DETECTED NOT DETECTED Final   CTX-M ESBL NOT DETECTED NOT DETECTED Final   Carbapenem resistance IMP NOT DETECTED NOT DETECTED Final   Carbapenem resistance KPC NOT DETECTED NOT DETECTED Final   Carbapenem resistance NDM NOT DETECTED NOT DETECTED Final   Carbapenem resistance VIM NOT DETECTED NOT DETECTED Final    Comment: Performed at [facility] [address] [place], [place] [address]  Blood culture (routine x 2)     Status: Abnormal   Collection Time: 02/25/21  5:19 AM   Specimen: BLOOD  Result Value Ref Range Status   Specimen Description   Final    BLOOD BLOOD RIGHT WRIST Performed at [facility] [address] [place], [place] [address]    Special Requests   Final    BOTTLES DRAWN AEROBIC AND ANAEROBIC Blood Culture adequate volume Performed at [facility] [address] [place], [place] [address]    Culture  Setup Time   Final    GRAM NEGATIVE RODS AEROBIC BOTTLE ONLY CRITICAL RESULT CALLED TO, READ BACK BY AND VERIFIED WITH: M LILLISTON,[email or web address]  02/26/21 [place]    Culture (A)  Final    PSEUDOMONAS AERUGINOSA SUSCEPTIBILITIES PERFORMED ON PREVIOUS CULTURE WITHIN THE LAST 5 DAYS. Performed at [facility], [place] [address] [place], [place] [address]    Report Status 02/27/2021 FINAL  Final  Blood Culture ID Panel (Reflexed)     Status: Abnormal   Collection  Time: 02/25/21  5:19 AM  Result Value Ref Range Status   Enterococcus faecalis NOT DETECTED NOT DETECTED Final   Enterococcus Faecium NOT DETECTED NOT DETECTED Final   Listeria monocytogenes NOT DETECTED NOT DETECTED Final   Staphylococcus species NOT DETECTED NOT DETECTED Final   Staphylococcus aureus (BCID) NOT DETECTED NOT DETECTED Final   Staphylococcus epidermidis NOT DETECTED NOT DETECTED Final   Staphylococcus lugdunensis NOT DETECTED NOT DETECTED Final   Streptococcus species NOT DETECTED NOT DETECTED Final   Streptococcus agalactiae NOT DETECTED NOT DETECTED Final   Streptococcus pneumoniae NOT DETECTED NOT DETECTED Final   Streptococcus pyogenes NOT DETECTED NOT DETECTED Final   A.calcoaceticus-baumannii NOT DETECTED NOT DETECTED Final   Bacteroides fragilis NOT DETECTED NOT DETECTED Final   Enterobacterales NOT DETECTED NOT DETECTED Final   Enterobacter cloacae complex NOT DETECTED NOT DETECTED Final   Escherichia coli NOT DETECTED NOT DETECTED Final   Klebsiella aerogenes NOT DETECTED NOT DETECTED Final   Klebsiella oxytoca NOT DETECTED NOT DETECTED Final   Klebsiella pneumoniae NOT DETECTED NOT DETECTED Final   Proteus species NOT DETECTED NOT DETECTED Final   Salmonella species NOT DETECTED NOT DETECTED Final   Serratia marcescens NOT DETECTED NOT DETECTED Final   Haemophilus influenzae NOT DETECTED NOT DETECTED Final   Neisseria meningitidis NOT DETECTED NOT DETECTED Final  Pseudomonas aeruginosa DETECTED (A) NOT DETECTED Final    Comment: CRITICAL RESULT CALLED TO, READ BACK BY AND VERIFIED WITH: M LILLISTON,[email or web address]  02/26/21 [place]    Stenotrophomonas maltophilia NOT DETECTED NOT DETECTED Final   Candida albicans NOT DETECTED NOT DETECTED Final   Candida auris NOT DETECTED NOT DETECTED Final   Candida glabrata NOT DETECTED NOT DETECTED Final   Candida krusei NOT DETECTED NOT DETECTED Final   Candida parapsilosis NOT DETECTED NOT DETECTED Final   Candida tropicalis  NOT DETECTED NOT DETECTED Final   Cryptococcus neoformans/gattii NOT DETECTED NOT DETECTED Final   CTX-M ESBL NOT DETECTED NOT DETECTED Final   Carbapenem resistance IMP NOT DETECTED NOT DETECTED Final   Carbapenem resistance KPC NOT DETECTED NOT DETECTED Final   Carbapenem resistance NDM NOT DETECTED NOT DETECTED Final   Carbapenem resistance VIM NOT DETECTED NOT DETECTED Final    Comment: Performed at [facility], [place] [address] [place], [place] [address]  Culture, blood (routine x 2)     Status: None (Preliminary result)   Collection Time: 02/26/21  2:17 PM   Specimen: BLOOD RIGHT HAND  Result Value Ref Range Status   Specimen Description   Final    BLOOD RIGHT HAND BLOOD Performed at [facility] [address], [place], [place] [address]    Special Requests   Final    BOTTLES DRAWN AEROBIC ONLY Blood Culture adequate volume Performed at [facility] [address], [place], [place] [address]    Culture   Final    NO GROWTH < 24 HOURS Performed at [facility] [address] [place], [place] [address]    Report Status PENDING  Incomplete  Culture, blood (routine x 2)     Status: None (Preliminary result)   Collection Time: 02/26/21  2:17 PM   Specimen: BLOOD LEFT HAND  Result Value Ref Range Status   Specimen Description   Final    BLOOD LEFT HAND BLOOD Performed at [facility] [address], [place], [place] [address]    Special Requests   Final    BOTTLES DRAWN AEROBIC ONLY Blood Culture adequate volume Performed at [facility] [address] [place], [place] [address]    Culture   Final    NO GROWTH < 24 HOURS Performed at [facility] [address] [place], [place] [address]    Report Status PENDING  Incomplete  MRSA Next Gen by PCR, Nasal     Status: Abnormal   Collection Time: Mar 22, 2021  1:12 AM   Specimen: Nasal Mucosa; Nasal Swab  Result Value Ref Range  Status   MRSA by PCR Next Gen DETECTED (A) NOT DETECTED Final    Comment: (NOTE) The GeneXpert MRSA Assay (FDA approved for NASAL specimens only), is one component of a comprehensive MRSA colonization surveillance program. It is not intended to diagnose MRSA infection nor to guide or monitor treatment for MRSA infections. Test performance is not FDA approved in patients less than 31 years old. Performed at [facility], [place] [address] [place], [place] [address]     Time spent: 0 minutes  Signed: Barb Merino, MD 22-Mar-2021

## 2021-03-24 NOTE — Progress Notes (Signed)
Elink updated regarding blood pressure response to albumin. BP trending down 30 min post albumin infusion completing. Currently maxed on all pressors. O2 sats also trending down.

## 2021-03-24 DEATH — deceased

## 2021-12-03 LAB — PROINSULIN/INSULIN RATIO
Insulin: 9.7 u[IU]/mL
Proinsulin: 15.7 pmol/L — ABNORMAL HIGH

## 2021-12-17 IMAGING — DX DG SHOULDER 2+V*R*
3 series · 3 of 3 positions shown · non-contrast
Comparison: Chest radiographs 08/17/2019 and earlier.

CLINICAL DATA: 75-year-old female status post fall at work.  Pain.

EXAM:
RIGHT SHOULDER - 2+ VIEW

[shoulder grashey]
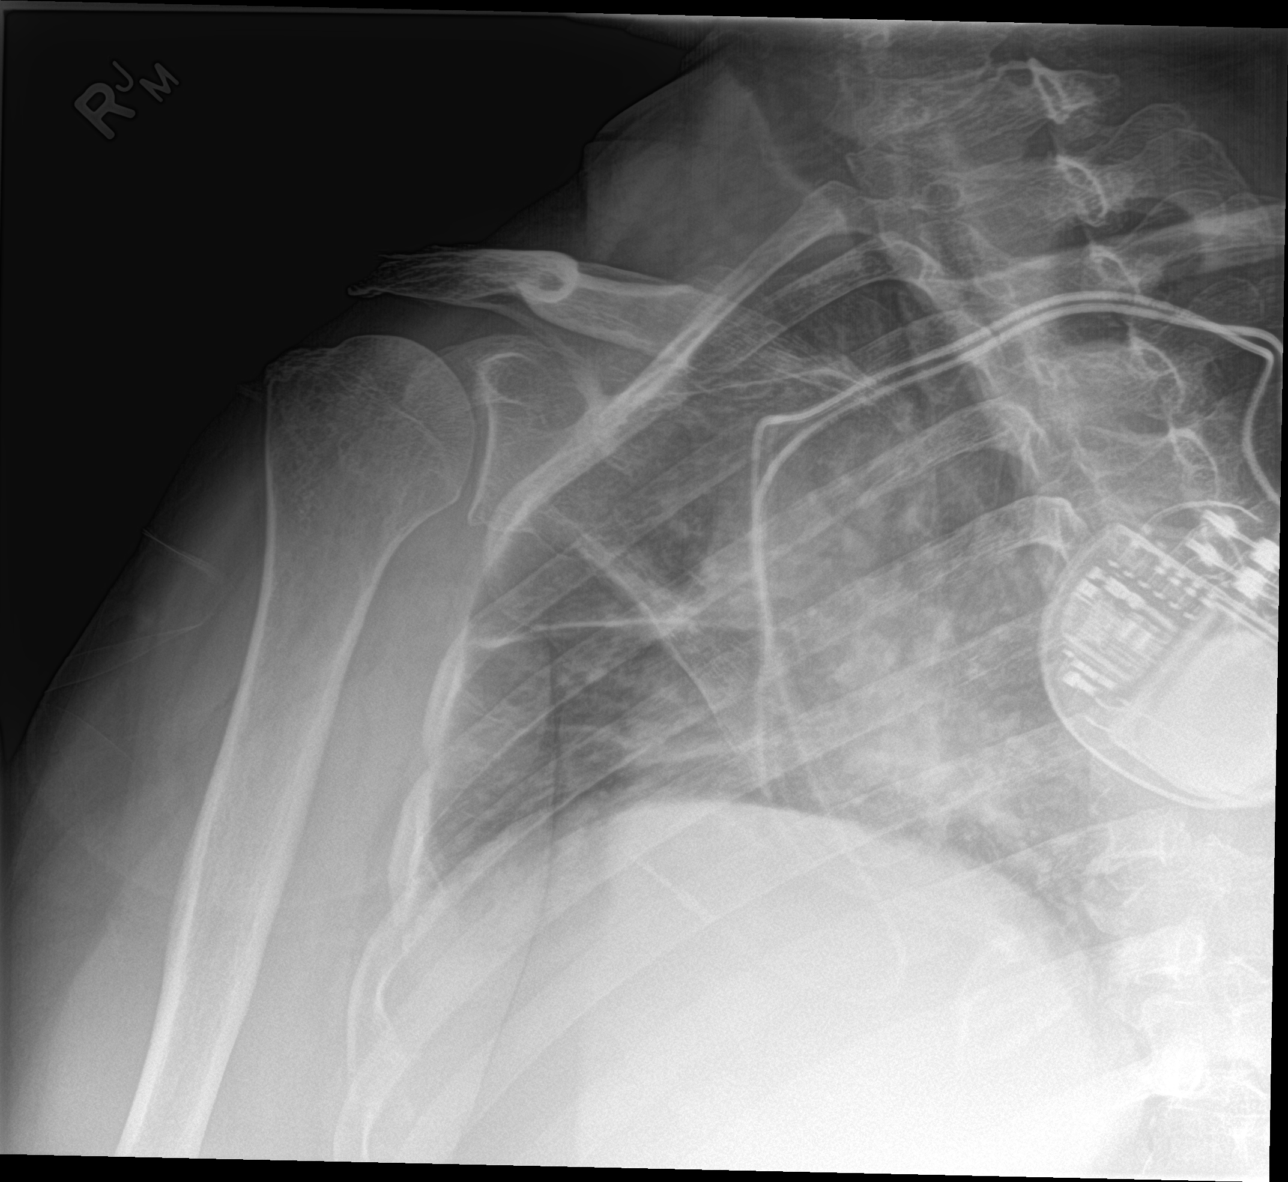

[shoulder y view]
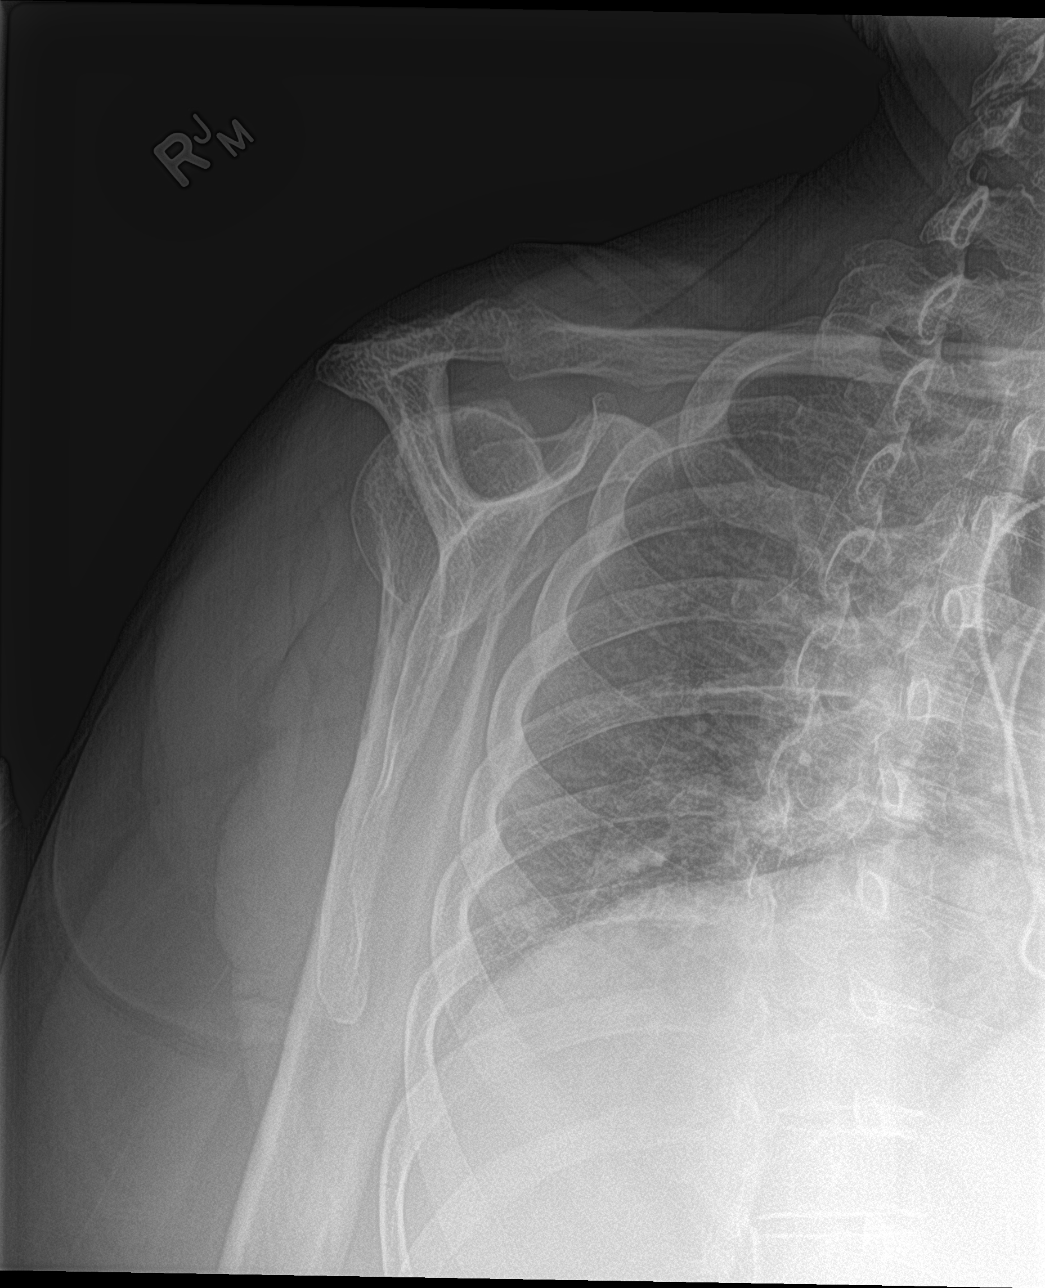

[shoulder axillary]
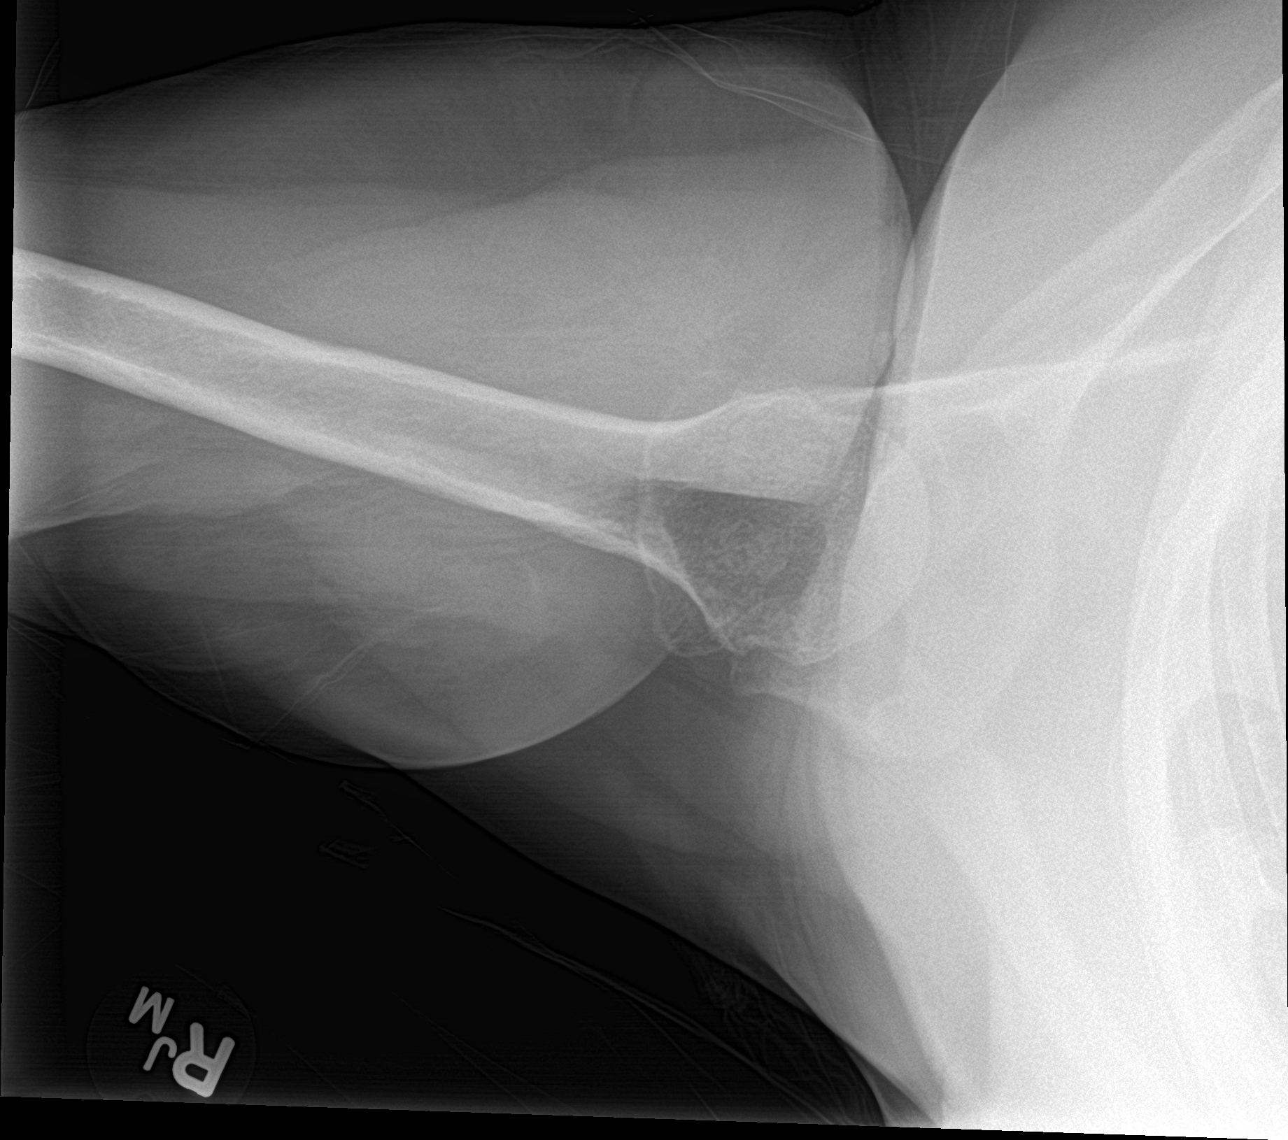

[3 of 3 positions shown; findings below may reference images not displayed]

FINDINGS: No glenohumeral joint dislocation. Proximal right humerus is intact.
Right scapula and right clavicle appear to remain intact.
Osteopenia. Visible right ribs appear intact. Chronic scarring or
atelectasis along the right minor fissure is stable. Left chest
cardiac pacemaker redemonstrated.
IMPRESSION: No acute fracture or dislocation identified about the right
shoulder.

## 2022-08-28 IMAGING — CT CT HEAD W/O CM
3 series · 15 of 47 positions shown, 18 images · non-contrast
Comparison: CT head 01/27/2021

CLINICAL DATA: Altered mental status



[Series 2: head wo · axial · 0.44mm/px · z∈[+1306,+1442]mm · 9 of 33 slices shown, 12 images]
[im 3/33  brain]
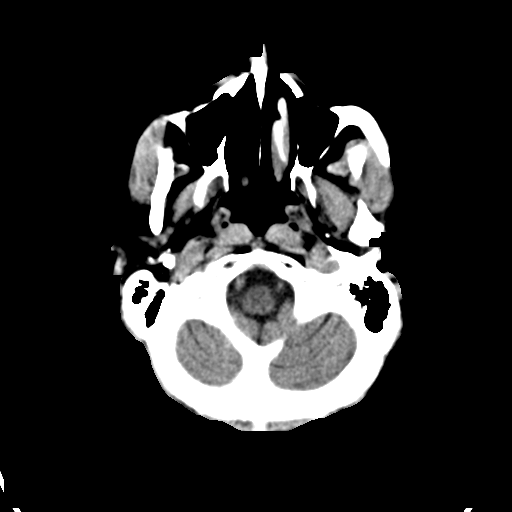
[im 3/33  bone]
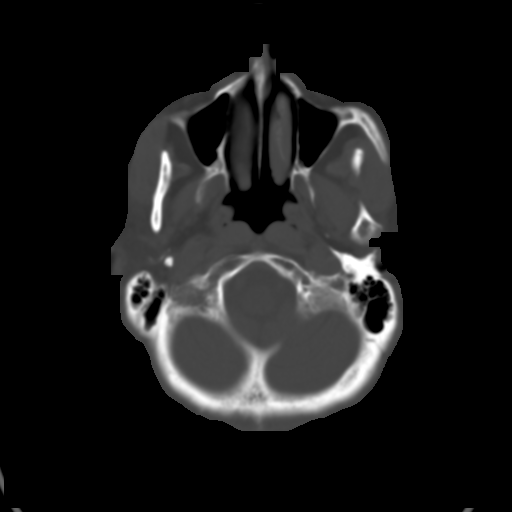
[im 6/33  brain]
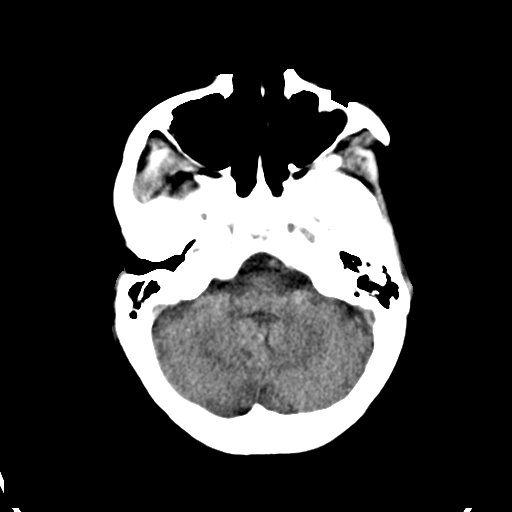
[im 9/33  brain]
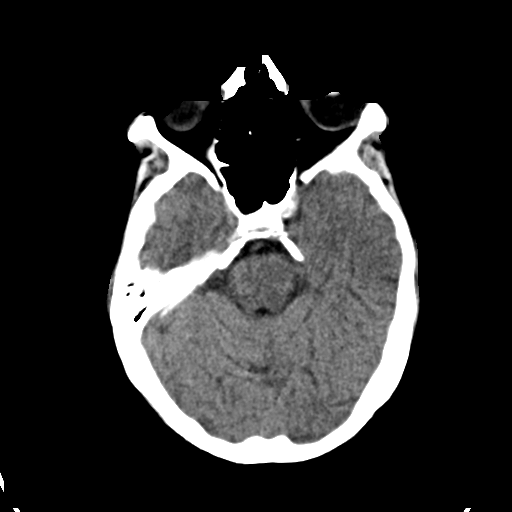
[im 13/33  brain]
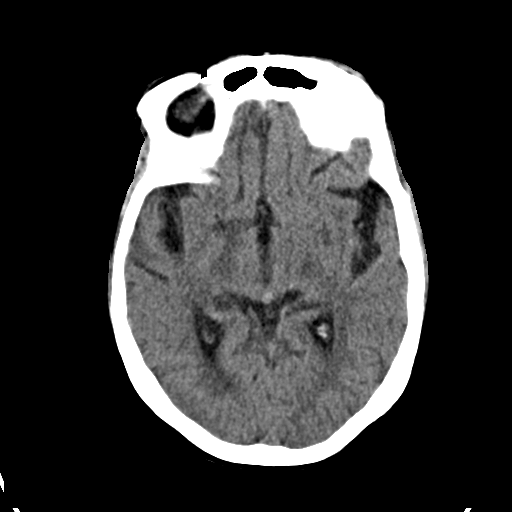
[im 17/33  brain]
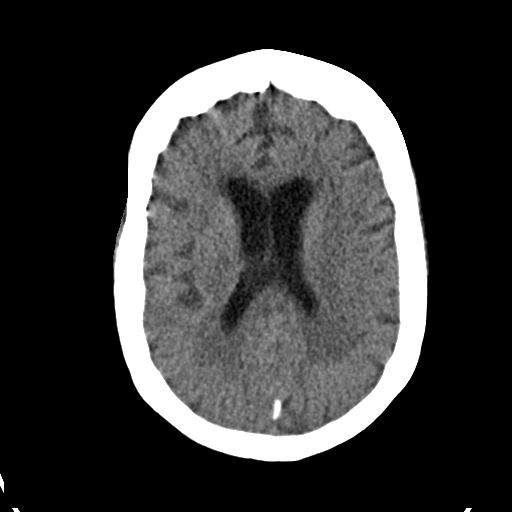
[im 17/33  bone]
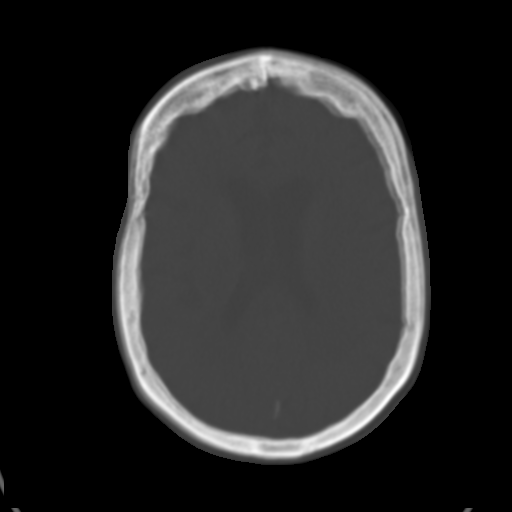
[im 20/33  brain]
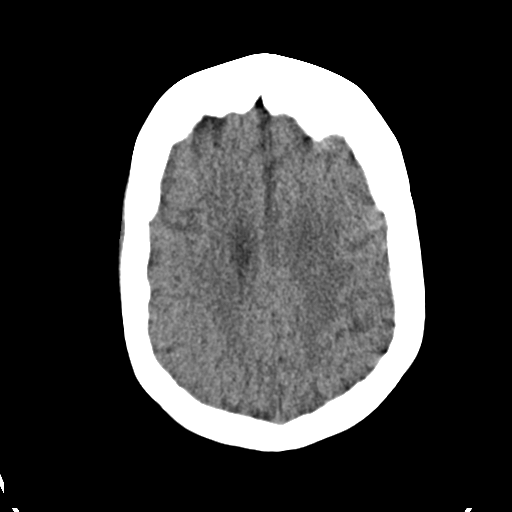
[im 24/33  brain]
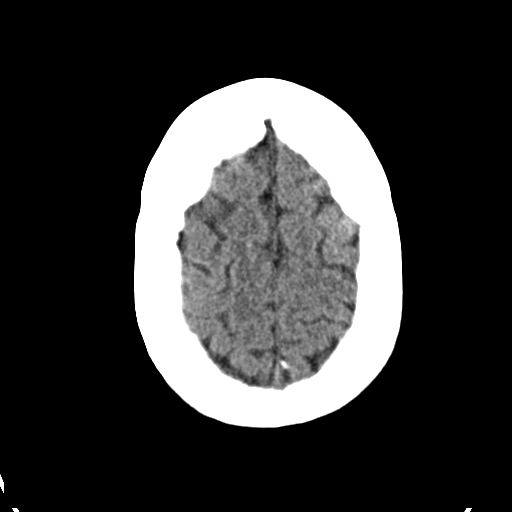
[im 27/33  brain]
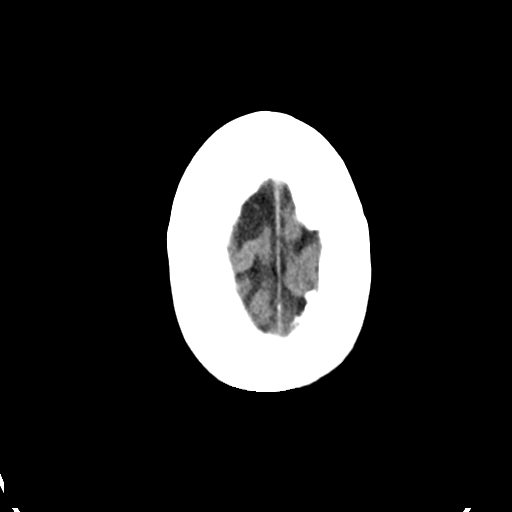
[im 30/33  brain]
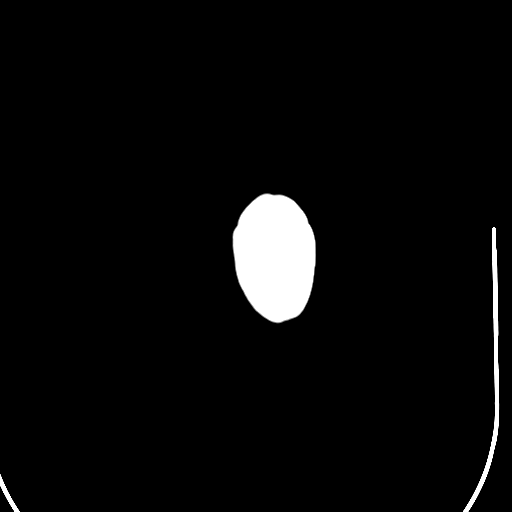
[im 30/33  bone]
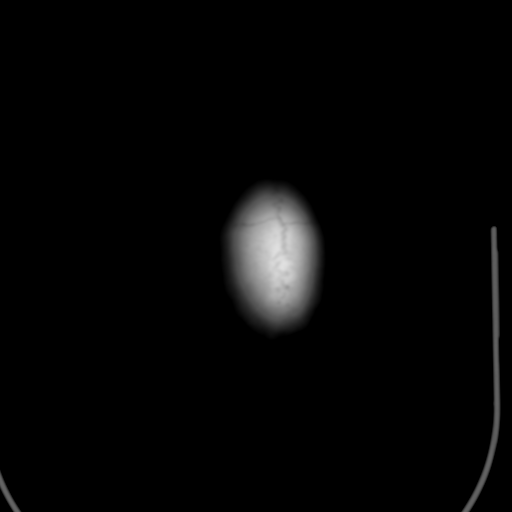

[Series 4: coronal soft · coronal · 0.32mm/px · 3 of 72 slices shown]
[im 24/72  brain]
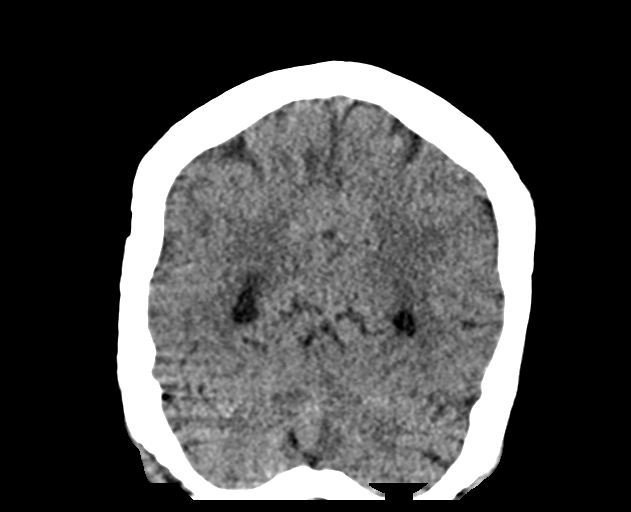
[im 32/72  brain]
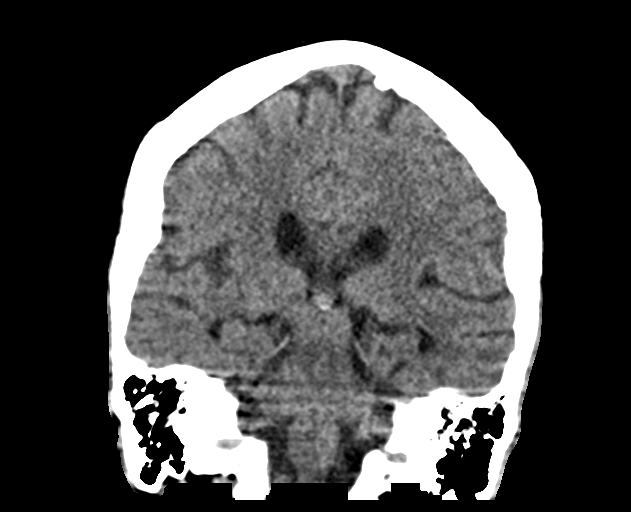
[im 40/72  brain]
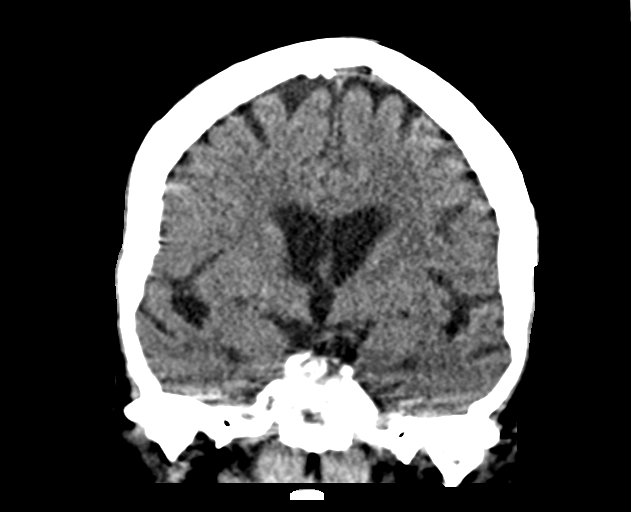

[Series 5: sag soft · sagittal · 0.32mm/px · 3 of 64 slices shown]
[im 22/64  brain]
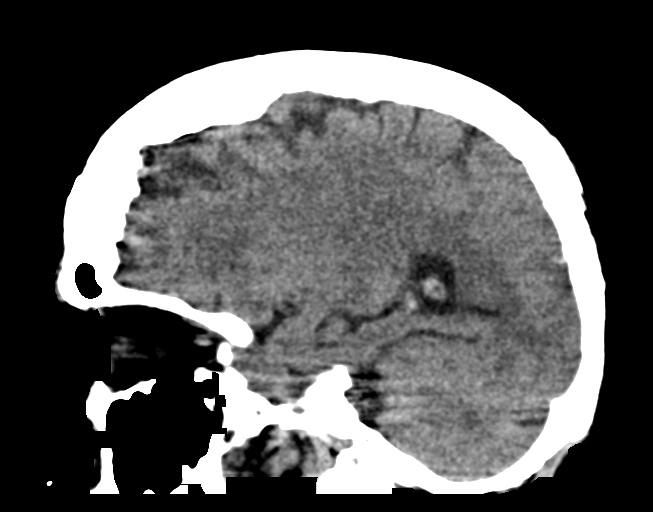
[im 32/64  brain]
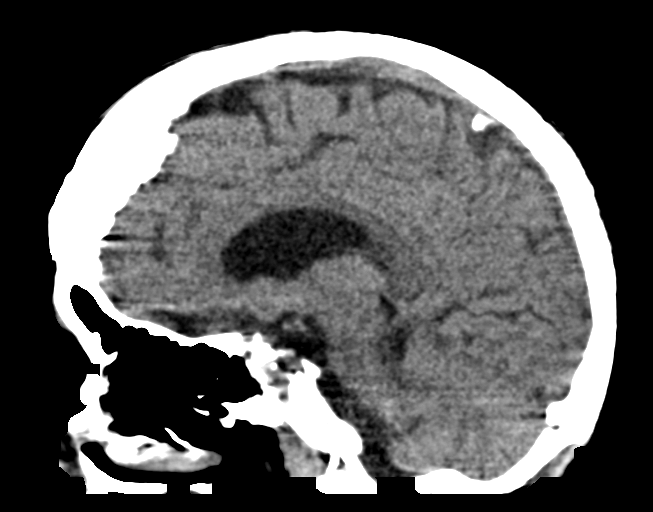
[im 43/64  brain]
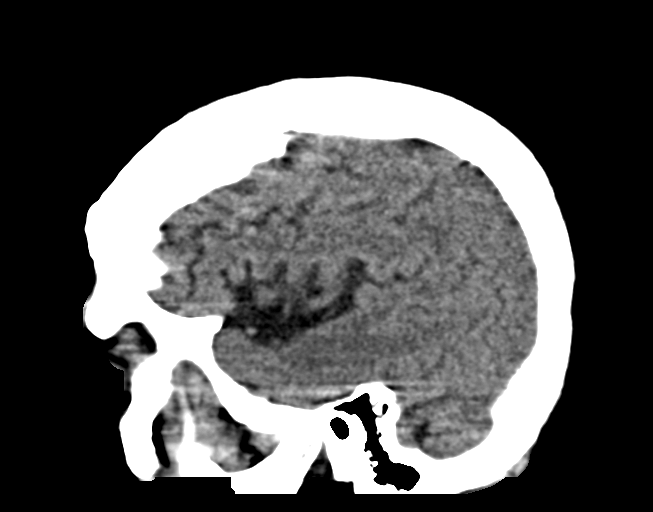

[15 of 47 positions shown; findings below may reference images not displayed]

FINDINGS: Brain: No acute intracranial hemorrhage, mass effect, or herniation.
No extra-axial fluid collections. No evidence of acute territorial
infarct. No hydrocephalus. Mild cortical volume loss. Mild chronic
microvascular ischemic changes.

Vascular: No hyperdense vessel or unexpected calcification.

Skull: Normal. Negative for fracture or focal lesion.

Sinuses/Orbits: No acute finding.

Other: None.
IMPRESSION: Chronic changes with no acute intracranial process identified.
